# Patient Record
Sex: Female | Born: 1940 | Race: Black or African American | Hispanic: No | Smoking: Never smoker
Health system: Southern US, Community
[De-identification: ages and names within clinical notes are randomized; demographics above are authoritative.]

## PROBLEM LIST (undated history)

## (undated) DIAGNOSIS — I639 Cerebral infarction, unspecified: Secondary | ICD-10-CM

## (undated) DIAGNOSIS — D214 Benign neoplasm of connective and other soft tissue of abdomen: Secondary | ICD-10-CM

## (undated) DIAGNOSIS — K922 Gastrointestinal hemorrhage, unspecified: Secondary | ICD-10-CM

## (undated) DIAGNOSIS — D126 Benign neoplasm of colon, unspecified: Secondary | ICD-10-CM

## (undated) DIAGNOSIS — I5032 Chronic diastolic (congestive) heart failure: Secondary | ICD-10-CM

## (undated) DIAGNOSIS — K219 Gastro-esophageal reflux disease without esophagitis: Secondary | ICD-10-CM

## (undated) DIAGNOSIS — E785 Hyperlipidemia, unspecified: Secondary | ICD-10-CM

## (undated) DIAGNOSIS — J309 Allergic rhinitis, unspecified: Secondary | ICD-10-CM

## (undated) DIAGNOSIS — I10 Essential (primary) hypertension: Secondary | ICD-10-CM

## (undated) DIAGNOSIS — G4719 Other hypersomnia: Secondary | ICD-10-CM

## (undated) DIAGNOSIS — M3320 Polymyositis, organ involvement unspecified: Secondary | ICD-10-CM

## (undated) HISTORY — PX: TONSILLECTOMY: SUR1361

## (undated) HISTORY — PX: EUS: SHX5427

## (undated) HISTORY — PX: LUMBAR LAMINECTOMY: SHX95

## (undated) HISTORY — PX: BACK SURGERY: SHX140

## (undated) HISTORY — PX: ABDOMINAL HYSTERECTOMY: SHX81

## (undated) HISTORY — DX: Benign neoplasm of colon, unspecified: D12.6

## (undated) HISTORY — DX: Other hypersomnia: G47.19

## (undated) HISTORY — DX: Gastro-esophageal reflux disease without esophagitis: K21.9

## (undated) HISTORY — DX: Essential (primary) hypertension: I10

## (undated) HISTORY — DX: Polymyositis, organ involvement unspecified: M33.20

## (undated) HISTORY — DX: Allergic rhinitis, unspecified: J30.9

## (undated) HISTORY — PX: ROTATOR CUFF REPAIR: SHX139

---

## 2003-11-02 ENCOUNTER — Encounter: Admission: RE | Admit: 2003-11-02 | Discharge: 2003-11-02 | Payer: Self-pay | Admitting: Internal Medicine

## 2004-03-12 ENCOUNTER — Emergency Department (HOSPITAL_COMMUNITY): Admission: EM | Admit: 2004-03-12 | Discharge: 2004-03-12 | Payer: Self-pay | Admitting: Emergency Medicine

## 2004-11-04 ENCOUNTER — Ambulatory Visit (HOSPITAL_COMMUNITY): Admission: RE | Admit: 2004-11-04 | Discharge: 2004-11-04 | Payer: Self-pay | Admitting: Internal Medicine

## 2004-11-12 ENCOUNTER — Ambulatory Visit: Payer: Self-pay | Admitting: Gastroenterology

## 2004-11-18 ENCOUNTER — Ambulatory Visit: Payer: Self-pay | Admitting: Gastroenterology

## 2004-11-18 ENCOUNTER — Ambulatory Visit: Payer: Self-pay | Admitting: Critical Care Medicine

## 2004-11-25 ENCOUNTER — Ambulatory Visit: Payer: Self-pay | Admitting: Cardiology

## 2004-12-03 ENCOUNTER — Ambulatory Visit: Payer: Self-pay | Admitting: Gastroenterology

## 2004-12-15 ENCOUNTER — Emergency Department (HOSPITAL_COMMUNITY): Admission: EM | Admit: 2004-12-15 | Discharge: 2004-12-15 | Payer: Self-pay | Admitting: Emergency Medicine

## 2004-12-23 ENCOUNTER — Ambulatory Visit: Payer: Self-pay | Admitting: Critical Care Medicine

## 2005-01-24 ENCOUNTER — Ambulatory Visit: Payer: Self-pay | Admitting: Gastroenterology

## 2005-02-06 ENCOUNTER — Encounter (INDEPENDENT_AMBULATORY_CARE_PROVIDER_SITE_OTHER): Payer: Self-pay | Admitting: Specialist

## 2005-02-06 ENCOUNTER — Ambulatory Visit: Payer: Self-pay | Admitting: Gastroenterology

## 2005-02-06 DIAGNOSIS — D126 Benign neoplasm of colon, unspecified: Secondary | ICD-10-CM | POA: Insufficient documentation

## 2005-02-19 ENCOUNTER — Ambulatory Visit: Payer: Self-pay | Admitting: Gastroenterology

## 2005-02-19 ENCOUNTER — Ambulatory Visit (HOSPITAL_COMMUNITY): Admission: RE | Admit: 2005-02-19 | Discharge: 2005-02-19 | Payer: Self-pay | Admitting: Gastroenterology

## 2005-02-26 ENCOUNTER — Ambulatory Visit: Payer: Self-pay | Admitting: Internal Medicine

## 2005-04-24 ENCOUNTER — Ambulatory Visit: Payer: Self-pay | Admitting: Internal Medicine

## 2005-06-12 ENCOUNTER — Emergency Department (HOSPITAL_COMMUNITY): Admission: EM | Admit: 2005-06-12 | Discharge: 2005-06-12 | Payer: Self-pay | Admitting: Emergency Medicine

## 2005-06-24 ENCOUNTER — Ambulatory Visit: Payer: Self-pay | Admitting: Internal Medicine

## 2005-09-08 ENCOUNTER — Encounter: Admission: RE | Admit: 2005-09-08 | Discharge: 2005-09-08 | Payer: Self-pay | Admitting: Neurosurgery

## 2005-11-12 ENCOUNTER — Ambulatory Visit: Payer: Self-pay | Admitting: Internal Medicine

## 2005-11-28 ENCOUNTER — Emergency Department (HOSPITAL_COMMUNITY): Admission: EM | Admit: 2005-11-28 | Discharge: 2005-11-28 | Payer: Self-pay | Admitting: Emergency Medicine

## 2006-03-12 ENCOUNTER — Encounter: Admission: RE | Admit: 2006-03-12 | Discharge: 2006-03-12 | Payer: Self-pay | Admitting: Internal Medicine

## 2006-06-16 ENCOUNTER — Encounter: Admission: RE | Admit: 2006-06-16 | Discharge: 2006-06-16 | Payer: Self-pay | Admitting: Internal Medicine

## 2006-06-23 ENCOUNTER — Ambulatory Visit: Payer: Self-pay | Admitting: Internal Medicine

## 2006-08-04 ENCOUNTER — Ambulatory Visit: Payer: Self-pay | Admitting: Internal Medicine

## 2006-08-12 ENCOUNTER — Ambulatory Visit: Payer: Self-pay | Admitting: Gastroenterology

## 2006-08-12 LAB — CONVERTED CEMR LAB
Ferritin: 27.2 ng/mL (ref 10.0–291.0)
Iron: 64 ug/dL (ref 42–145)
Saturation Ratios: 15.9 % — ABNORMAL LOW (ref 20.0–50.0)
Transferrin: 288.1 mg/dL (ref >=212.0)

## 2006-08-26 ENCOUNTER — Ambulatory Visit: Payer: Self-pay | Admitting: Gastroenterology

## 2006-08-26 LAB — CONVERTED CEMR LAB
OCCULT 2: NEGATIVE
OCCULT 3: NEGATIVE
OCCULT 4: NEGATIVE
OCCULT 5: NEGATIVE

## 2006-08-27 ENCOUNTER — Encounter: Payer: Self-pay | Admitting: Gastroenterology

## 2006-08-27 ENCOUNTER — Ambulatory Visit (HOSPITAL_COMMUNITY): Admission: RE | Admit: 2006-08-27 | Discharge: 2006-08-27 | Payer: Self-pay | Admitting: Gastroenterology

## 2006-09-16 ENCOUNTER — Ambulatory Visit: Payer: Self-pay | Admitting: Gastroenterology

## 2007-01-01 ENCOUNTER — Emergency Department (HOSPITAL_COMMUNITY): Admission: EM | Admit: 2007-01-01 | Discharge: 2007-01-01 | Payer: Self-pay | Admitting: Emergency Medicine

## 2007-04-26 DIAGNOSIS — J309 Allergic rhinitis, unspecified: Secondary | ICD-10-CM | POA: Insufficient documentation

## 2007-04-26 DIAGNOSIS — M3320 Polymyositis, organ involvement unspecified: Secondary | ICD-10-CM

## 2007-04-26 DIAGNOSIS — I10 Essential (primary) hypertension: Secondary | ICD-10-CM | POA: Insufficient documentation

## 2007-04-26 DIAGNOSIS — K219 Gastro-esophageal reflux disease without esophagitis: Secondary | ICD-10-CM | POA: Insufficient documentation

## 2007-06-18 ENCOUNTER — Encounter: Admission: RE | Admit: 2007-06-18 | Discharge: 2007-06-18 | Payer: Self-pay | Admitting: Internal Medicine

## 2008-03-09 ENCOUNTER — Ambulatory Visit: Payer: Self-pay | Admitting: Gastroenterology

## 2008-03-22 ENCOUNTER — Ambulatory Visit: Payer: Self-pay | Admitting: Gastroenterology

## 2008-03-22 ENCOUNTER — Encounter: Payer: Self-pay | Admitting: Gastroenterology

## 2008-03-24 ENCOUNTER — Encounter: Payer: Self-pay | Admitting: Gastroenterology

## 2008-03-29 ENCOUNTER — Telehealth: Payer: Self-pay | Admitting: Gastroenterology

## 2008-05-18 ENCOUNTER — Encounter: Admission: RE | Admit: 2008-05-18 | Discharge: 2008-08-16 | Payer: Self-pay | Admitting: Internal Medicine

## 2008-06-21 ENCOUNTER — Encounter: Admission: RE | Admit: 2008-06-21 | Discharge: 2008-09-19 | Payer: Self-pay | Admitting: Internal Medicine

## 2008-06-30 ENCOUNTER — Emergency Department (HOSPITAL_COMMUNITY): Admission: EM | Admit: 2008-06-30 | Discharge: 2008-06-30 | Payer: Self-pay | Admitting: Emergency Medicine

## 2008-07-06 ENCOUNTER — Telehealth (INDEPENDENT_AMBULATORY_CARE_PROVIDER_SITE_OTHER): Payer: Self-pay | Admitting: *Deleted

## 2008-07-06 ENCOUNTER — Encounter: Payer: Self-pay | Admitting: Gastroenterology

## 2008-07-06 DIAGNOSIS — K319 Disease of stomach and duodenum, unspecified: Secondary | ICD-10-CM

## 2008-08-17 ENCOUNTER — Ambulatory Visit: Payer: Self-pay | Admitting: Gastroenterology

## 2008-08-17 ENCOUNTER — Ambulatory Visit (HOSPITAL_COMMUNITY): Admission: RE | Admit: 2008-08-17 | Discharge: 2008-08-17 | Payer: Self-pay | Admitting: Gastroenterology

## 2008-11-09 ENCOUNTER — Encounter: Admission: RE | Admit: 2008-11-09 | Discharge: 2008-11-09 | Payer: Self-pay | Admitting: Internal Medicine

## 2009-01-13 DIAGNOSIS — K922 Gastrointestinal hemorrhage, unspecified: Secondary | ICD-10-CM

## 2009-01-13 HISTORY — DX: Gastrointestinal hemorrhage, unspecified: K92.2

## 2009-03-31 ENCOUNTER — Inpatient Hospital Stay (HOSPITAL_COMMUNITY): Admission: EM | Admit: 2009-03-31 | Discharge: 2009-04-03 | Payer: Self-pay | Admitting: Podiatry

## 2009-04-01 ENCOUNTER — Encounter (INDEPENDENT_AMBULATORY_CARE_PROVIDER_SITE_OTHER): Payer: Self-pay | Admitting: Internal Medicine

## 2009-04-01 ENCOUNTER — Encounter: Payer: Self-pay | Admitting: Internal Medicine

## 2009-04-02 ENCOUNTER — Ambulatory Visit: Payer: Self-pay | Admitting: Internal Medicine

## 2009-04-16 ENCOUNTER — Ambulatory Visit: Payer: Self-pay | Admitting: Gastroenterology

## 2009-04-16 LAB — CONVERTED CEMR LAB
Eosinophils Absolute: 0 10*3/uL (ref 0.0–0.7)
MCHC: 32.4 g/dL (ref 30.0–36.0)
MCV: 86.9 fL (ref 78.0–100.0)
Neutro Abs: 8.5 10*3/uL — ABNORMAL HIGH (ref 1.4–7.7)
Platelets: 353 10*3/uL (ref 150.0–400.0)

## 2009-04-23 ENCOUNTER — Encounter: Payer: Self-pay | Admitting: Internal Medicine

## 2009-06-13 ENCOUNTER — Emergency Department (HOSPITAL_COMMUNITY): Admission: EM | Admit: 2009-06-13 | Discharge: 2009-06-13 | Payer: Self-pay | Admitting: Emergency Medicine

## 2009-06-22 ENCOUNTER — Telehealth (INDEPENDENT_AMBULATORY_CARE_PROVIDER_SITE_OTHER): Payer: Self-pay | Admitting: *Deleted

## 2009-06-28 ENCOUNTER — Ambulatory Visit: Payer: Self-pay | Admitting: Gastroenterology

## 2009-07-02 ENCOUNTER — Encounter: Payer: Self-pay | Admitting: Internal Medicine

## 2009-07-05 ENCOUNTER — Ambulatory Visit (HOSPITAL_COMMUNITY): Admission: RE | Admit: 2009-07-05 | Discharge: 2009-07-05 | Payer: Self-pay | Admitting: Internal Medicine

## 2009-07-09 ENCOUNTER — Encounter: Admission: RE | Admit: 2009-07-09 | Discharge: 2009-07-09 | Payer: Self-pay | Admitting: Orthopedic Surgery

## 2009-08-13 ENCOUNTER — Ambulatory Visit: Payer: Self-pay | Admitting: Internal Medicine

## 2009-08-13 DIAGNOSIS — J99 Respiratory disorders in diseases classified elsewhere: Secondary | ICD-10-CM | POA: Insufficient documentation

## 2009-08-16 ENCOUNTER — Telehealth: Payer: Self-pay | Admitting: Internal Medicine

## 2009-08-16 ENCOUNTER — Ambulatory Visit: Admission: RE | Admit: 2009-08-16 | Discharge: 2009-08-16 | Payer: Self-pay | Admitting: Internal Medicine

## 2009-08-16 ENCOUNTER — Encounter: Payer: Self-pay | Admitting: Internal Medicine

## 2009-09-13 LAB — CONVERTED CEMR LAB
Basophils Relative: 0.1 % (ref 0.0–3.0)
Ferritin: 35.9 ng/mL (ref 10.0–291.0)
HCT: 33.1 % — ABNORMAL LOW (ref 36.0–46.0)
Hemoglobin: 11.1 g/dL — ABNORMAL LOW (ref 12.0–15.0)
Lymphocytes Relative: 11.2 % — ABNORMAL LOW (ref 12.0–46.0)
MCHC: 33.4 g/dL (ref 30.0–36.0)
MCV: 85.9 fL (ref 78.0–100.0)
Monocytes Relative: 2.4 % — ABNORMAL LOW (ref 3.0–12.0)
Neutro Abs: 7.9 10*3/uL — ABNORMAL HIGH (ref 1.4–7.7)
Neutrophils Relative %: 86.2 % — ABNORMAL HIGH (ref 43.0–77.0)

## 2009-09-14 ENCOUNTER — Inpatient Hospital Stay (HOSPITAL_COMMUNITY): Admission: RE | Admit: 2009-09-14 | Discharge: 2009-09-16 | Payer: Self-pay | Admitting: Orthopedic Surgery

## 2009-09-24 ENCOUNTER — Ambulatory Visit: Payer: Self-pay | Admitting: Gastroenterology

## 2010-01-28 ENCOUNTER — Encounter
Admission: RE | Admit: 2010-01-28 | Discharge: 2010-01-28 | Payer: Self-pay | Source: Home / Self Care | Attending: Orthopedic Surgery | Admitting: Orthopedic Surgery

## 2010-02-02 ENCOUNTER — Other Ambulatory Visit: Payer: Self-pay | Admitting: Internal Medicine

## 2010-02-02 DIAGNOSIS — Z1239 Encounter for other screening for malignant neoplasm of breast: Secondary | ICD-10-CM

## 2010-02-03 ENCOUNTER — Encounter: Payer: Self-pay | Admitting: Internal Medicine

## 2010-02-03 ENCOUNTER — Encounter: Payer: Self-pay | Admitting: Rheumatology

## 2010-02-04 ENCOUNTER — Encounter: Payer: Self-pay | Admitting: Internal Medicine

## 2010-02-12 NOTE — Letter (Signed)
Summary: [facility]  [facility]   Imported By: Lester Walworth 08/20/2009 09:44:25  _____________________________________________________________________  External Attachment:    Type:   Image     Comment:   External Document

## 2010-02-12 NOTE — Progress Notes (Signed)
Summary: PFT and ABG results  Phone Note Call from Patient   Caller: Patient Call For:  Glick Reason for Call: Talk to Nurse Summary of Call: Send PFT's done at [facility] to MR and Dr. Rodman Pickle @ [facility]. Initial call taken by: Eugene Gavia,  August 16, 2009 3:26 PM  Follow-up for Phone Call        pt was just seen by MR on 08-13-2009 for surgical clearance and was set up for ABGs and PFTs at [facility].  Results were faxed back to [place] and I put results in MR's look at box for him to review.    ATC pt x 2.  Line rings and then someone picks up and then hangs up.  Will forward message to MR to address test results.  Aundra Millet Reynolds LPN  August 16, 2009 3:37 PM   Additional Follow-up for Phone Call Additional follow up Details #1::        ok wil make addendum to my office note and sent it to ortho doc.   tell patient [facility] for surgery but needs further workup on pft's due to abnormality.needs to see me back after surgery in 1-2 months  Also please check with [facility] pft lab if they did MIP and MEP. I wrote for that but do not see the result Additional Follow-up by: Kalman Shan MD,  August 16, 2009 7:20 PM    Additional Follow-up for Phone Call Additional follow up Details #2::    lm for pft lab to call with results of MIP and MEP   Philipp Deputy [facility]  August 17, 2009 10:31 AM   per carol blackwell these 2 results were faxed to dr Marchelle Gearing and Landry Dyke in his look at.   Philipp Deputy [facility]  August 17, 2009 2:36 PM   Additional Follow-up for Phone Call Additional follow up Details #3:: Details for Additional Follow-up Action Taken: ok thanks. I will see patient in 2 months from now. pls give appt  ATC unable to leave message, phone would ring until someone picked up and then on answer and hang up. Reminder for 2 month follow up placed. Zackery Barefoot CMA  August 21, 2009 9:45 AM  Additional Follow-up by: Kalman Shan MD,  August 21, 2009 3:25 AM

## 2010-02-12 NOTE — Assessment & Plan Note (Signed)
GASTROINTESTINAL PROBLEM LIST: 1. History of colonic adenomas.  Colonoscopy January 2007.  I removed three colon polyps, one was tubulovillous, 10 mm.  Her next colonoscopy has been scheduled for January 2010.  Repeat colonoscopy March, 2010 found more adenomas, recall colonoscopy was set at 3 year interval. 2. 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  This is seen by EGD January 2007, small, round.  Attempt at a [place] was done two weeks later, but she did not tolerate the procedure very well, due to gagging and discomfort. Repeat EGD January, 2010 found lesion have not changed in size in 3 year interval, still less than 1 cm. Recommend clinical observation only  History of Present Illness Visit Type: Follow-up Visit Primary GI MD: Rob Bunting MD Primary Provider: Weber Cooks. Clelia Croft, MD  Requesting Provider: n/a Chief Complaint: F/u visit  History of Present Illness:     very pleasant 70 year old woman whom I last saw over a year and a half ago. She was recently found to have a significant anemia. She had no overt GI bleeding but was Hemoccult positive and underwent EGD by one of my partners Dr. Leone Payor.  He described n area of her stomach with erythema, several small erosions. It was thought that this may represent gastric antral vascular ectasias. However biopsies that were performed did not show that entity and also only showed reactive changes.   Since leaving the hospital, she has been on 2 pills of iron every day.  NO overt GI bleeding.  Feels overall quite well.   She was taking Alleve (probably only 6 total pills).   her hemoglobin at discharge was 7.8 and this afternoon just prior to this visit her hemoglobin was 9.6.   GI Review of Systems          Current Medications (verified): 1)  Myferon 150 Forte 150-25-1 Mg-Mcg-Mg Caps (Iron Polysacch Cmplx-B12-Fa) .... One Tablet By Mouth Once Daily 2)  Atenolol 50 Mg Tabs (Atenolol) .... One Tablet By Mouth Every Morning 3)  Fish Oil 1000 Mg  Caps (Omega-3 Fatty Acids) .... One Tablet By Mouth Once Daily 4)  Folic Acid 400 Mcg Tabs (Folic Acid) .... One Tablet By Mouth Once Daily 5)  Glucosamine 500 Mg Caps (Glucosamine Sulfate) .... One Tablet Two Times A Day 6)  Nexium 40 Mg Cpdr (Esomeprazole Magnesium) .... One Tablet By Mouth Once Daily 7)  Prednisone 10 Mg Tabs (Prednisone) .... One Tablet By Mouth Once Daily Every Morning 8)  Vitamin B-12 1000 Mcg Tabs (Cyanocobalamin) .... One Tablet By Mouth Two Times A Day 9)  Vitamin B-12 500 Mcg  Tabs (Cyanocobalamin) .... One Tablet By Mouth Once Daily 10)  Vitamin D (Ergocalciferol) 50000 Unit Caps (Ergocalciferol) .... One Tablet By Mouth Once Daily 11)  Vitamin E 600 Unit  Caps (Vitamin E) .... One Tablet By Mouth Once Daily 12)  Xanax 0.25 Mg Tabs (Alprazolam) .... Every Six Hours As Needed For Pain 13)  Furosemide 40 Mg Tabs (Furosemide) .... One Tablet By Mouth Once Daily  Allergies (verified): No Known Drug Allergies  Vital Signs:  Patient profile:   70 year old female Pulse rate:   84 / minute Pulse rhythm:   regular BP sitting:   132 / 78  (left arm) Cuff size:   regular  Vitals Entered By: Ok Anis CMA (April 16, 2009 3:18 PM)  Physical Exam  Additional Exam:  Constitutional: cushingoid appearing, sitting in wheelchair Psychiatric: alert and oriented times 3 Abdomen: soft, non-tender, non-distended, normal  bowel sounds    Impression & Recommendations:  Problem # 1:  erythema, erosions and distal stomach this process probably did contribute to her anemia however she has not had any overt GI bleeding. Her hemoglobin bumped very well on iron replacement and I recommended he simply continue the iron replacement once to twice daily for now. Our office will set up a repeat CBC in 2 months time. She knows that she has symptomatic, overt GI bleeding she is to call.  Patient Instructions: 1)  Stay on iron once to twice a day.  2)  Repeat CBC, iron, Ferritin, TIBC  in 2 months. 3)  Stay off of NSAIDs.  Tylenol should be your first pain medicine choice. 4)  IF any obvious GI bleeding (vomitting blood, passing blood in stool) please call Dr. Christella Hartigan. 5)  A copy of this information will be sent to Dr. Clelia Croft. 6)  The medication list was reviewed and reconciled.  All changed / newly prescribed medications were explained.  A complete medication list was provided to the patient / caregiver.

## 2010-02-12 NOTE — Letter (Signed)
Summary: [facility]  [facility]   Imported By: Lester [place] 08/20/2009 09:43:13  _____________________________________________________________________  External Attachment:    Type:   Image     Comment:   External Document

## 2010-02-12 NOTE — Procedures (Signed)
Summary: Upper Endoscopy  Patient: [name] Note: All result statuses are Final unless otherwise noted.  Tests: (1) Upper Endoscopy (EGD)   EGD Upper Endoscopy       DONE     [place] [facility]     [address] [place], [place]  [address]           ENDOSCOPY PROCEDURE REPORT           PATIENT:  [name] [name], [name] [name]  MR#:  [id number]     BIRTHDATE:  Jun 27, 1940, 68 yrs. old  GENDER:  female           ENDOSCOPIST:  Iva Boop, MD, [facility]     Referred by:  Kari Baars, M.D.           PROCEDURE DATE:  04/01/2009     PROCEDURE:  EGD with biopsy     ASA CLASS:  Class III     INDICATIONS:  anemia, hemeoccult positive stool           MEDICATIONS:   Fentanyl 50 mcg, Versed 5 mg     TOPICAL ANESTHETIC:  Cetacaine Spray           DESCRIPTION OF PROCEDURE:   After the risks benefits and     alternatives of the procedure were thoroughly explained, informed     consent was obtained.  The EG-[id number] ([id number]) endoscope was     introduced through the mouth and advanced to the second portion of     the duodenum, without limitations.  The instrument was slowly     withdrawn as the mucosa was fully examined.     <<PROCEDUREIMAGES>>           Abnormal appearing mucosa in the antrum. Broad areas of erythema     and submucosal heme, ? gastritis vs. GAVE. Multiple biopsies were     obtained and sent to pathology.  submucosal nodule in the body of     the stomach. 1cm bulge in distal body (as described previously at     EGD and EUS)  Multiple erosions were found in the second portion     of the duodenum. Very superficial, few seen.  There were multiple     polyps identified. Seen in proximal stomach, all subcentimeter and     benign-appearing.  Otherwise the examination was normal.     Retroflexed views revealed Retroflexion exam demonstrated findings as     previously described.    The scope was then withdrawn from the     patient and the procedure completed.        COMPLICATIONS:  None           ENDOSCOPIC IMPRESSION:     1) Abnormal mucosa in the antrum - gastritis vs. gastric antral     vascular ectasia     2) Submucosal nodule in the body of the stomach - unchanged from     prior descriptions     3) Erosions, multiple in the second portion duodenum (a few and     superficial - tiny)     4) Polyps - a few subcentimeter in proximal stomach     5) Otherwise normal examination     RECOMMENDATIONS:     1) Await biopsy results     continue PPI     if this is gastric antral vascular ectasia then ablative therapy     is reasonable.     some of decline  in Hgb from 11-9 is dilutional and suspect an     element of dehydration - but likely chronically leaking some blood     from stomach and some anemia from polymyositis, chronic disease     doubt she needs a repeat colonoscopy (last in 03/2008)           REPEAT EXAM:  await biopsies to determine           Iva Boop, MD, Clementeen Graham           CC:  Kari Baars, MD     Rob Bunting, MD           n.     eSIGNED:   Iva Boop at 04/01/2009 01:15 PM           Gaskill, Veryl Speak, [id number]  Note: An exclamation mark (!) indicates a result that was not dispersed into the flowsheet. Document Creation Date: 04/01/2009 1:16 PM _______________________________________________________________________  (1) Order result status: Final Collection or observation date-time: 04/01/2009 13:01 Requested date-time:  Receipt date-time:  Reported date-time:  Referring Physician:   Ordering Physician: Stan Head [phone]) Specimen Source:  Source: Launa Grill Order Number: [id number] Lab site:

## 2010-02-12 NOTE — Assessment & Plan Note (Signed)
Summary: SURGICAL CLEARANCE LEFT SHOULDER SURGERY/CB   Visit Type:  Initial Consult Copy to:  Dr. Madelon Lips Primary Provider/Referring Provider:  Dr. Martha Clan  CC:  Pulmonary Consult for surgery clearance.  Pt needs to have surgery on left arm by Dr. Madelon Lips.  History of Present Illness: IOV 08/13/2009: 70 year old female used to be followed by DR Maple [place] till 08/08/2006 for mild intermittent asthm question and allergic rhinitis. She is known steroid dependent since moving to [place] in 2004-2006 due to polymyositis. States that Dr. Madelon Lips wants to operate on left shoulder tear. She thinks she is here for preop pulmonary clearance but she denies any pulmonary problems. Denies dyspnea, wheezing, cough, hemoptysis, sputum, edema, orthopnea, paroxysmal nocturnal dyspnea, fever, chills. Denies using inhalers.   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 SHe even uses wheelchair (motorized) even for bed to bathroom. Able to self feed. Able to go to toilet by self. Able to bathe but not in bathroom; uses a pan. Able to transfer from wheelchair to bed iwth difficulty. Came here as passenger in regular car.   Of note: she is unsure why she needs a preop pulm clearance. I d/w Dr. Madelon Lips and he feels given her comorbidities and collagen vascular disease it would be useful for herto have preop pulm clearance.   Preventive Screening-Counseling & Management  Alcohol-Tobacco     Smoking Status: never  Current Medications (verified): 1)  Myferon 150 Forte 150-25-1 Mg-Mcg-Mg Caps (Iron Polysacch Cmplx-B12-Fa) .... One Tablet By Mouth Once Daily 2)  Atenolol 50 Mg Tabs (Atenolol) .... One Tablet By Mouth Every Morning 3)  Fish Oil 1000 Mg Caps (Omega-3 Fatty Acids) .... One Tablet By Mouth Once Daily 4)  Folic Acid 400 Mcg Tabs (Folic Acid) .... One Tablet By Mouth Once Daily 5)  Glucosamine Chondr 1500 Complx  Caps (Glucosamine-Chondroit-Vit C-Mn) .... Once Daily 6)  Nexium  40 Mg Cpdr (Esomeprazole Magnesium) .... One Tablet By Mouth Once Daily 7)  Prednisone 10 Mg Tabs (Prednisone) .... One Tablet By Mouth Once Daily Every Morning 8)  Vitamin B-12 1000 Mcg Tabs (Cyanocobalamin) .... Take 1 Tablet By Mouth Once A Day 9)  Vitamin D (Ergocalciferol) 50000 Unit Caps (Ergocalciferol) .... One Tablet By Mouth Once Daily 10)  Vitamin E 600 Unit  Caps (Vitamin E) .... One Tablet By Mouth Once Daily 11)  Xanax 0.25 Mg Tabs (Alprazolam) .... Every Six Hours As Needed For Pain 12)  Furosemide 40 Mg Tabs (Furosemide) .... One Tablet By Mouth Once Daily 13)  Niacin 500 Mg Tabs (Niacin) .... Take 1 Tablet By Mouth Once A Day 14)  Vitamin C 1000 Mg Tabs (Ascorbic Acid) .... Take 1 Tablet By Mouth Once A Day 15)  Blue Manna 400mg  .... Once Daily 16)  Caltrate 600 1500 Mg Tabs (Calcium Carbonate) .... Once Daily 17)  Atenolol 50 Mg Tabs (Atenolol) .... Once Daily 18)  Klor-Con M20 20 Meq Cr-Tabs (Potassium Chloride Crys Cr) .... Take 1 Tablet By Mouth Once A Day 19)  Poly Iron .... Once Daily  Allergies (verified): 1)  Codeine  Past History:  Past Surgical History: Last updated: 04/26/2007 Lumbar Laminectomy (1998) PAH Tonsillectomy  Family History: Last updated: 08/13/2009 allergies-grandchildren  Social History: Last updated: 08/13/2009 Patient never smoked.  no alcohol  total of 5 children - 4 living children Retired form Nurse, learning disability in [place]  Risk Factors: Smoking Status: never (08/13/2009)  Past Medical History: Current Problems:  COLONIC POLYPS, ADENOMATOUS (ICD-211.3) GASTROESOPHAGEAL REFLUX DISEASE (ICD-530.81) POLYMYOSITIS (ICD-710.4) ALLERGIC RHINITIS (ICD-477.9) HYPERTENSION (ICD-401.9) Question of Mild Intermittent Asthma and Allergic Rhiniits  > Dr Fannie Knee patient till 08/08/2006  Past Pulmonary History:  Pulmonary History: DATE OF ADMISSION:  03/31/2009   DATE OF DISCHARGE:  04/03/2009      DIAGNOSES AT TIME OF DISCHARGE:    1. 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   2. Polymyositis on chronic steroids.   3. Essential hypertension.   4. Osteoporosis with compression fracture.   Family History: allergies-grandchildren  Social History: Patient never smoked.  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  The patient denies shortness of breath with activity, shortness of breath at rest, productive cough, non-productive cough, coughing up blood, chest pain, irregular heartbeats, indigestion, loss of appetite, weight change, abdominal pain, difficulty swallowing, sore throat, tooth/dental problems, headaches, nasal congestion/difficulty breathing through nose, sneezing, itching, ear ache, hand/feet swelling, rash, change in color of mucus, and fever.         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 SHe even uses wheelchair (motorized) even for bed to bathroom. Able to self feed. Able to go to toilet by self. Able to bathe but not in bathroom; uses a pan. Able to transfer from wheelchair to bed iwth difficulty. Came here as passenger in regular car.   Vital Signs:  Patient profile:   70 year old female O2 Sat:      99 % on Room air Temp:     98.5 degrees F oral Pulse rate:   82 / minute BP sitting:   134 / 86  (right arm) Cuff size:   regular  Vitals Entered By: Gweneth Dimitri RN (August 13, 2009 2:59 PM)  O2 Flow:  Room air CC: Pulmonary Consult for surgery clearance.  Pt needs to have surgery on left arm by Dr. Madelon Lips   Physical Exam  General:  CHRONICALLY UNWELL LOOKING sitting in wheel chair pleaseant unable to abduct her shoulders beyond 90 degree cushingoid Head:  normocephalic and atraumatic Eyes:  PERRLA/EOM intact;  conjunctiva and sclera clear Ears:  TMs intact and clear with normal canals Nose:  no deformity, discharge, inflammation, or lesions Mouth:  no deformity or lesions Neck:  no masses, thyromegaly, or abnormal cervical nodes Chest Wall:  no deformities noted Lungs:  clear bilaterally to auscultation and percussion Heart:  regular rate and rhythm, S1, S2 without murmurs, rubs, gallops, or clicks Abdomen:  bowel sounds positive; abdomen soft and non-tender without masses, or organomegaly Msk:  siting in wheelchair Pulses:  pulses normal Extremities:  no clubbing, cyanosis, edema, or deformity noted Neurologic:  CN II-XII grossly intact with normal reflexes, coordination, muscle strength and tone Skin:  intact without lesions or rashes Cervical Nodes:  no significant adenopathy Axillary Nodes:  no significant adenopathy Psych:  alert and cooperative; normal mood and affect; normal attention span and concentration   CT Abdomen/Pelvis  Procedure date:  03/31/2009  Findings:      ABd ct lung cut - looks clear. No evidence of Interst Lung DIsease  CXR  Procedure date:  03/31/2009  Findings:      looks clear. There  is someRt diaph elevation  CT of Chest  Procedure date:  06/16/2006  Findings:      no evidence of ILD per official report   Comments:      similar to CT chest in nov 2006. 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 THis can involve the lung either as intersititial lung disease or resp muscle weakness. She is very disabled  and is wheel chair bound. Within these confines she denies resp complaints. REview of imaging 2006 -> 2011 does not show any evidence of ILD. However CXR March 31, 2009 shows right diaphragm weakness. Therefore resp muscle myopathy might be present (either due to PM or even steroids). We can  test for this from a presurgical stand point only. Her  poor functional status implies no value in testing for other reasons. Will get ABG, and NIF and PFTs (doubt she can cooperate for PFTs)  Orders: Pulmonary Referral (Pulmonary) Consultation Level V (87564)  Problem # 2:  PRE-OPERATIVE RESPIRATORY EXAMINATION (ICD-V72.82) Assessment: New  OTher than disabled status and age 62 I do not see any risk factor for pulmonary issues following shoulder surgery. OVreall risk is low-moderate and acceptable. However, would like to get PFTs and ABG to refine risk assessment further. NEvertheless, will recommend surgery in hospital with anesthesia support. Wil recommend DVT prophylaxis and stress dose steroids after surgery  d/w Dr. Madelon Lips over phone  Orders: Pulmonary Referral (Pulmonary) Consultation Level V [phone])  Medications Added to Medication List This Visit: 1)  Glucosamine Chondr 1500 Complx Caps (Glucosamine-chondroit-vit c-mn) .... Once daily 2)  Vitamin B-12 1000 Mcg Tabs (Cyanocobalamin) .... Take 1 tablet by mouth once a day 3)  Niacin 500 Mg Tabs (Niacin) .... Take 1 tablet by mouth once a day 4)  Vitamin C 1000 Mg Tabs (Ascorbic acid) .... Take 1 tablet by mouth once a day 5)  Blue Manna 400mg   .... Once daily 6)  Caltrate 600 1500 Mg Tabs (Calcium carbonate) .... Once daily 7)  Atenolol 50 Mg Tabs (Atenolol) .... Once daily 8)  Klor-con M20 20 Meq Cr-tabs (Potassium chloride crys cr) .... Take 1 tablet by mouth once a day 9)  Poly Iron  .... Once daily  Patient Instructions: 1)  Please have the following tests and cal  back for review  2)   > ABG 3)   > Full PFTs and MIP and ME  4)  All tests to be done at [facility] long pft lab 5)  Based on these tests I will give final clearance for surgery   Immunization History:  Influenza Immunization History:    Influenza:  historical (10/13/2008)   Appended Document: SURGICAL CLEARANCE LEFT SHOULDER SURGERY/CB REviewed PFTs.  SHows restriction FVC is 73%. ABG is normal. Acceptable for surgery. Mild-moderate risk. WOuld rec in hospital anesthesia support at extubation, stress dose steroids postoperatively, dvt prophylaxis, incentive spirometry and early mobilization.

## 2010-02-12 NOTE — Progress Notes (Signed)
Summary: lab reminder   Phone Note Outgoing Call Call back at Home Phone [phone]   Call placed by: Chales Abrahams CMA Duncan Dull),  June 22, 2009 9:37 AM Summary of Call: called and reminded pt to have labs Initial call taken by: Chales Abrahams CMA Duncan Dull),  June 22, 2009 9:38 AM

## 2010-02-15 ENCOUNTER — Ambulatory Visit: Payer: Self-pay

## 2010-02-20 ENCOUNTER — Encounter (HOSPITAL_COMMUNITY)
Admission: RE | Admit: 2010-02-20 | Discharge: 2010-02-20 | Disposition: A | Discharge status: Home / Self Care | Payer: Medicare Other | Source: Ambulatory Visit | Attending: Orthopedic Surgery | Admitting: Orthopedic Surgery

## 2010-02-20 DIAGNOSIS — M719 Bursopathy, unspecified: Secondary | ICD-10-CM | POA: Insufficient documentation

## 2010-02-20 DIAGNOSIS — M67919 Unspecified disorder of synovium and tendon, unspecified shoulder: Secondary | ICD-10-CM | POA: Insufficient documentation

## 2010-02-20 LAB — CBC
HCT: 36.4 % (ref 36.0–46.0)
MCH: 27.2 pg (ref 26.0–34.0)
MCHC: 32.4 g/dL (ref 30.0–36.0)
MCV: 83.9 fL (ref 78.0–100.0)
RDW: 18.2 % — ABNORMAL HIGH (ref 11.5–15.5)

## 2010-02-20 LAB — BASIC METABOLIC PANEL
Calcium: 9.2 mg/dL (ref 8.4–10.5)
GFR calc Af Amer: >60 mL/min (ref >60)
GFR calc non Af Amer: >60 mL/min (ref >60)
Sodium: 140 mEq/L (ref 135–145)

## 2010-02-20 LAB — SURGICAL PCR SCREEN: MRSA, PCR: NEGATIVE

## 2010-02-22 ENCOUNTER — Observation Stay (HOSPITAL_COMMUNITY)
Admission: RE | Admit: 2010-02-22 | Discharge: 2010-02-24 | DRG: 497 | Disposition: A | Discharge status: Home Health | Payer: Medicare Other | Source: Ambulatory Visit | Attending: Orthopedic Surgery | Admitting: Orthopedic Surgery

## 2010-02-22 DIAGNOSIS — I10 Essential (primary) hypertension: Secondary | ICD-10-CM | POA: Insufficient documentation

## 2010-02-22 DIAGNOSIS — M19019 Primary osteoarthritis, unspecified shoulder: Secondary | ICD-10-CM | POA: Insufficient documentation

## 2010-02-22 DIAGNOSIS — Z5333 Arthroscopic surgical procedure converted to open procedure: Secondary | ICD-10-CM | POA: Insufficient documentation

## 2010-02-22 DIAGNOSIS — M719 Bursopathy, unspecified: Principal | ICD-10-CM | POA: Insufficient documentation

## 2010-02-22 DIAGNOSIS — M67919 Unspecified disorder of synovium and tendon, unspecified shoulder: Principal | ICD-10-CM | POA: Insufficient documentation

## 2010-02-22 DIAGNOSIS — M25819 Other specified joint disorders, unspecified shoulder: Secondary | ICD-10-CM | POA: Insufficient documentation

## 2010-02-22 DIAGNOSIS — M3320 Polymyositis, organ involvement unspecified: Secondary | ICD-10-CM | POA: Insufficient documentation

## 2010-02-22 DIAGNOSIS — K219 Gastro-esophageal reflux disease without esophagitis: Secondary | ICD-10-CM | POA: Insufficient documentation

## 2010-02-22 DIAGNOSIS — E669 Obesity, unspecified: Secondary | ICD-10-CM | POA: Insufficient documentation

## 2010-02-23 LAB — BASIC METABOLIC PANEL
BUN: 17 mg/dL (ref 6–23)
Calcium: 8.9 mg/dL (ref 8.4–10.5)
GFR calc non Af Amer: 60 mL/min — ABNORMAL LOW (ref >60)
Potassium: 3.6 mEq/L (ref 3.5–5.1)
Sodium: 141 mEq/L (ref 135–145)

## 2010-02-23 LAB — CBC
Platelets: 316 10*3/uL (ref 150–400)
RDW: 18.2 % — ABNORMAL HIGH (ref 11.5–15.5)
WBC: 13.2 10*3/uL — ABNORMAL HIGH (ref 4.0–10.5)

## 2010-02-24 LAB — CBC
HCT: 33 % — ABNORMAL LOW (ref 36.0–46.0)
Hemoglobin: 10.6 g/dL — ABNORMAL LOW (ref 12.0–15.0)
RDW: 18.3 % — ABNORMAL HIGH (ref 11.5–15.5)
WBC: 11.4 10*3/uL — ABNORMAL HIGH (ref 4.0–10.5)

## 2010-02-26 NOTE — Op Note (Signed)
  NAME:  [name] [name], [name] [name] NO.:  [id number]  MEDICAL RECORD NO.:  [id number]           PATIENT TYPE:  LOCATION:                                 FACILITY:  PHYSICIAN:  Dyke Brackett, M.D.    DATE OF BIRTH:  02/14/1940  DATE OF PROCEDURE: DATE OF DISCHARGE:                              OPERATIVE REPORT   INDICATIONS:  This is a 70 year old with intractable shoulder pain, status post left rotator cuff tear with significant medical history including severe polymyositis with MRI-proven symptomatic right rotator cuff, thought to be amenable to overnight hospitalization due to medical complexity and comorbidities.  PREOPERATIVE DIAGNOSES: 1. Complete 5 cm of rotator cuff tear, right shoulder. 2. Impingement. 3. Acromioclavicular joint arthritis. 4. 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  POSTOPERATIVE DIAGNOSES: 1. Complete 5 cm of rotator cuff tear, right shoulder. 2. Impingement. 3. Acromioclavicular joint arthritis. 4. 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  OPERATION: 1. Open rotator cuff repair, acromioplasty which is chronic. 2. Open distal clavicle excision. 3. Arthroscopic debridement, glenohumeral joint.  SURGEON:  Dyke Brackett, MD  ASSISTANT:  Laural Benes. Su Hilt, [place]  BLOOD LOSS:  Minimal.  ANESTHESIA:  General with a nerve block.  DESCRIPTION OF PROCEDURE:  Examination under anesthesia showed she had no loss of motion, no instability.  She was arthroscoped and posterior anterior portal.  Systematic inspection of the glenohumeral joint showed degenerative tearing in the anterior-superior labrum, but actually some mild to moderate glenohumeral degenerative changes which were debrided separate from the rest of the procedure.  She had a complete rotator cuff tear as I had dictated above.  We decided at that point to convert the procedure to open procedure, made an incision bisecting the AC acromial interval and the deltoid 2 cm distal to the  tip of the acromion excised, 1.5 of the [facility] joint distal clavicle as well as acromioplasty was, released the CA ligament.  Bursectomy was carried out with a lot of degenerative tissue.  We actually enlarged the tear somewhat to good viable tissue.  There was a stump of tendon remained.  We did not use this and set to oversew it once we repaired the tear.  To repair the tear, we used a combination of 2.5 x 5 Arthrex anchors placed over the foot.  After a burred surface of bone was obtained and followed that up by placing the PushLock on the lateral aspect of the humerus to imbricate the knots and the sutures over additional cuff tissue, essentially a watertight repair was obtained with dissection of a very small minute leading edge that we could not get "watertight."  Closure was effected on the deltoid with #1 Ethibond and 2-0 Vicryl and Monocryl for skin.  Lightly pressure sterile dressing and sling were applied.  She was taken to the recovery room in stable condition.     Dyke Brackett, M.D.     WDC/MEDQ  D:  02/22/2010  T:  02/23/2010  Job:  [id number]  Electronically Signed by W. Rockne Dearinger M.D. on 02/26/2010 09:36:54 AM

## 2010-03-12 ENCOUNTER — Ambulatory Visit: Payer: Self-pay

## 2010-03-14 ENCOUNTER — Other Ambulatory Visit: Payer: Self-pay

## 2010-03-14 LAB — CONVERTED CEMR LAB
Basophils Absolute: 0 10*3/uL (ref 0.0–0.1)
Eosinophils Absolute: 0.1 10*3/uL (ref 0.0–0.7)
Ferritin: 58.9 ng/mL (ref 10.0–291.0)
HCT: 34.7 % — ABNORMAL LOW (ref 36.0–46.0)
Hemoglobin: 11.2 g/dL — ABNORMAL LOW (ref 12.0–15.0)
Iron: 52 ug/dL (ref 42–145)
Monocytes Absolute: 0.9 10*3/uL (ref 0.1–1.0)
RBC: 4.01 M/uL (ref 3.87–5.11)
RDW: 18.8 % — ABNORMAL HIGH (ref 11.5–14.6)
Transferrin: 305.2 mg/dL (ref 212.0–360.0)
WBC: 9.4 10*3/uL (ref 4.5–10.5)

## 2010-03-29 LAB — BLOOD GAS, ARTERIAL
Acid-Base Excess: 1.3 mmol/L (ref 0.0–2.0)
Bicarbonate: 25 mEq/L — ABNORMAL HIGH (ref 20.0–24.0)
O2 Saturation: 96.4 %
Patient temperature: 98.6
TCO2: 22.5 mmol/L (ref 0–100)
pO2, Arterial: 81.4 mmHg (ref 80.0–100.0)

## 2010-03-29 LAB — DIFFERENTIAL
Basophils Absolute: 0 10*3/uL (ref 0.0–0.1)
Eosinophils Absolute: 0.1 10*3/uL (ref 0.0–0.7)
Lymphs Abs: 1.7 10*3/uL (ref 0.7–4.0)
Monocytes Absolute: 0.7 10*3/uL (ref 0.1–1.0)
Monocytes Relative: 9 % (ref 3–12)
Neutro Abs: 5.5 10*3/uL (ref 1.7–7.7)

## 2010-03-29 LAB — COMPREHENSIVE METABOLIC PANEL
ALT: 31 U/L (ref 0–35)
AST: 43 U/L — ABNORMAL HIGH (ref 0–37)
Albumin: 3.6 g/dL (ref 3.5–5.2)
Alkaline Phosphatase: 57 U/L (ref 39–117)
CO2: 23 mEq/L (ref 19–32)
Chloride: 99 mEq/L (ref 96–112)
Creatinine, Ser: 0.78 mg/dL (ref 0.4–1.2)
GFR calc Af Amer: >60 mL/min (ref >60)
GFR calc non Af Amer: >60 mL/min (ref >60)
Potassium: 3.9 mEq/L (ref 3.5–5.1)
Total Bilirubin: 0.6 mg/dL (ref 0.3–1.2)

## 2010-03-29 LAB — CBC
Hemoglobin: 13.2 g/dL (ref 12.0–15.0)
MCH: 26.3 pg (ref 26.0–34.0)
RBC: 5.01 MIL/uL (ref 3.87–5.11)
WBC: 8 10*3/uL (ref 4.0–10.5)

## 2010-04-07 LAB — HEMOGLOBIN AND HEMATOCRIT, BLOOD
HCT: 27.3 % — ABNORMAL LOW (ref 36.0–46.0)
HCT: 30.4 % — ABNORMAL LOW (ref 36.0–46.0)
HCT: 30.4 % — ABNORMAL LOW (ref 36.0–46.0)
HCT: 35 % — ABNORMAL LOW (ref 36.0–46.0)
Hemoglobin: 11.1 g/dL — ABNORMAL LOW (ref 12.0–15.0)
Hemoglobin: 8 g/dL — ABNORMAL LOW (ref 12.0–15.0)
Hemoglobin: 8.9 g/dL — ABNORMAL LOW (ref 12.0–15.0)
Hemoglobin: 9.7 g/dL — ABNORMAL LOW (ref 12.0–15.0)

## 2010-04-07 LAB — CBC
HCT: 31.5 % — ABNORMAL LOW (ref 36.0–46.0)
HCT: 35.3 % — ABNORMAL LOW (ref 36.0–46.0)
Hemoglobin: 11.2 g/dL — ABNORMAL LOW (ref 12.0–15.0)
Hemoglobin: 9.8 g/dL — ABNORMAL LOW (ref 12.0–15.0)
MCHC: 31.2 g/dL (ref 30.0–36.0)
MCHC: 31.6 g/dL (ref 30.0–36.0)
MCV: 86.1 fL (ref 78.0–100.0)
MCV: 86.6 fL (ref 78.0–100.0)
MCV: 87 fL (ref 78.0–100.0)
Platelets: 243 10*3/uL (ref 150–400)
Platelets: 340 10*3/uL (ref 150–400)
RBC: 3.07 MIL/uL — ABNORMAL LOW (ref 3.87–5.11)
RDW: 20.1 % — ABNORMAL HIGH (ref 11.5–15.5)
RDW: 20.4 % — ABNORMAL HIGH (ref 11.5–15.5)
WBC: 9.4 10*3/uL (ref 4.0–10.5)

## 2010-04-07 LAB — CK TOTAL AND CKMB (NOT AT ARMC)
CK, MB: 21.6 ng/mL (ref 0.3–4.0)
CK, MB: 22.5 ng/mL (ref 0.3–4.0)
Relative Index: 1.1 (ref 0.0–2.5)
Relative Index: 1.2 (ref 0.0–2.5)
Total CK: 1893 U/L — ABNORMAL HIGH (ref 7–177)
Total CK: 1911 U/L — ABNORMAL HIGH (ref 7–177)

## 2010-04-07 LAB — BLOOD GAS, ARTERIAL
Bicarbonate: 13.6 mEq/L — ABNORMAL LOW (ref 20.0–24.0)
Patient temperature: 98.6
pCO2 arterial: 29.2 mmHg — ABNORMAL LOW (ref 35.0–45.0)
pH, Arterial: 7.29 — ABNORMAL LOW (ref 7.350–7.400)

## 2010-04-07 LAB — COMPREHENSIVE METABOLIC PANEL
ALT: 35 U/L (ref 0–35)
AST: 22 U/L (ref 0–37)
AST: 33 U/L (ref 0–37)
Albumin: 3.5 g/dL (ref 3.5–5.2)
Albumin: 3.6 g/dL (ref 3.5–5.2)
Alkaline Phosphatase: 47 U/L (ref 39–117)
BUN: 71 mg/dL — ABNORMAL HIGH (ref 6–23)
BUN: 82 mg/dL — ABNORMAL HIGH (ref 6–23)
CO2: 18 mEq/L — ABNORMAL LOW (ref 19–32)
Calcium: 10 mg/dL (ref 8.4–10.5)
Calcium: 10.1 mg/dL (ref 8.4–10.5)
Calcium: 8.2 mg/dL — ABNORMAL LOW (ref 8.4–10.5)
Calcium: 9 mg/dL (ref 8.4–10.5)
Creatinine, Ser: 1.09 mg/dL (ref 0.4–1.2)
Creatinine, Ser: 1.58 mg/dL — ABNORMAL HIGH (ref 0.4–1.2)
Creatinine, Ser: 1.95 mg/dL — ABNORMAL HIGH (ref 0.4–1.2)
GFR calc Af Amer: 30 mL/min — ABNORMAL LOW (ref >60)
GFR calc Af Amer: 31 mL/min — ABNORMAL LOW (ref >60)
GFR calc Af Amer: >60 mL/min (ref >60)
GFR calc non Af Amer: 50 mL/min — ABNORMAL LOW (ref >60)
Glucose, Bld: 72 mg/dL (ref 70–99)
Glucose, Bld: 78 mg/dL (ref 70–99)
Glucose, Bld: 81 mg/dL (ref 70–99)
Potassium: 4.8 mEq/L (ref 3.5–5.1)
Sodium: 136 mEq/L (ref 135–145)
Total Bilirubin: 0.4 mg/dL (ref 0.3–1.2)
Total Protein: 5.8 g/dL — ABNORMAL LOW (ref 6.0–8.3)
Total Protein: 6.8 g/dL (ref 6.0–8.3)
Total Protein: 7.2 g/dL (ref 6.0–8.3)

## 2010-04-07 LAB — URINALYSIS, ROUTINE W REFLEX MICROSCOPIC
Bilirubin Urine: NEGATIVE
Hgb urine dipstick: NEGATIVE
Nitrite: NEGATIVE
Specific Gravity, Urine: 1.018 (ref 1.005–1.030)
Urobilinogen, UA: 0.2 mg/dL (ref 0.0–1.0)
pH: 5 (ref 5.0–8.0)

## 2010-04-07 LAB — POCT CARDIAC MARKERS
CKMB, poc: 16.6 ng/mL (ref 1.0–8.0)
Troponin i, poc: <0.05 ng/mL (ref 0.00–0.09)

## 2010-04-07 LAB — DIFFERENTIAL
Eosinophils Absolute: 0.1 10*3/uL (ref 0.0–0.7)
Eosinophils Relative: 2 % (ref 0–5)
Lymphocytes Relative: 28 % (ref 12–46)
Lymphs Abs: 2.6 10*3/uL (ref 0.7–4.0)
Monocytes Absolute: 0.8 10*3/uL (ref 0.1–1.0)

## 2010-04-07 LAB — BASIC METABOLIC PANEL
BUN: 22 mg/dL (ref 6–23)
Calcium: 7.8 mg/dL — ABNORMAL LOW (ref 8.4–10.5)
Creatinine, Ser: 0.88 mg/dL (ref 0.4–1.2)
GFR calc non Af Amer: >60 mL/min (ref >60)
Glucose, Bld: 88 mg/dL (ref 70–99)

## 2010-04-07 LAB — URINE CULTURE

## 2010-04-07 LAB — NO BLOOD PRODUCTS

## 2010-04-07 LAB — URINE MICROSCOPIC-ADD ON

## 2010-04-07 LAB — LIPASE, BLOOD: Lipase: 55 U/L (ref 11–59)

## 2010-04-07 LAB — SALICYLATE LEVEL: Salicylate Lvl: <4 mg/dL (ref 2.8–20.0)

## 2010-04-07 LAB — LACTIC ACID, PLASMA: Lactic Acid, Venous: 1.3 mmol/L (ref 0.5–2.2)

## 2010-04-09 ENCOUNTER — Other Ambulatory Visit (INDEPENDENT_AMBULATORY_CARE_PROVIDER_SITE_OTHER): Payer: Medicare Other

## 2010-04-09 DIAGNOSIS — K319 Disease of stomach and duodenum, unspecified: Secondary | ICD-10-CM

## 2010-04-09 LAB — CBC WITH DIFFERENTIAL/PLATELET
Basophils Relative: 0.1 % (ref 0.0–3.0)
Eosinophils Absolute: 0.1 10*3/uL (ref 0.0–0.7)
Eosinophils Relative: 0.7 % (ref 0.0–5.0)
Lymphocytes Relative: 13.5 % (ref 12.0–46.0)
Neutrophils Relative %: 83.7 % — ABNORMAL HIGH (ref 43.0–77.0)
Platelets: 349 10*3/uL (ref 150.0–400.0)
RBC: 4.46 Mil/uL (ref 3.87–5.11)
WBC: 8.3 10*3/uL (ref 4.5–10.5)

## 2010-04-10 ENCOUNTER — Telehealth: Payer: Self-pay

## 2010-04-10 NOTE — Telephone Encounter (Signed)
Please call her CBC is normal   ----- Message ----- From: Chales Abrahams, CMA Sent: 04/10/2010 12:28 PM To: Rob Bunting, MD      Pt aware  Of labs

## 2010-04-20 LAB — COMPREHENSIVE METABOLIC PANEL
ALT: 37 U/L — ABNORMAL HIGH (ref 0–35)
AST: 38 U/L — ABNORMAL HIGH (ref 0–37)
Alkaline Phosphatase: 65 U/L (ref 39–117)
CO2: 25 mEq/L (ref 19–32)
Calcium: 9.3 mg/dL (ref 8.4–10.5)
Chloride: 106 mEq/L (ref 96–112)
GFR calc non Af Amer: 57 mL/min — ABNORMAL LOW (ref >60)
Glucose, Bld: 87 mg/dL (ref 70–99)
Sodium: 141 mEq/L (ref 135–145)
Total Bilirubin: 0.5 mg/dL (ref 0.3–1.2)

## 2010-04-20 LAB — CBC
Hemoglobin: 10.9 g/dL — ABNORMAL LOW (ref 12.0–15.0)
MCHC: 32.3 g/dL (ref 30.0–36.0)
RBC: 3.99 MIL/uL (ref 3.87–5.11)
WBC: 9.4 10*3/uL (ref 4.0–10.5)

## 2010-04-21 ENCOUNTER — Emergency Department (HOSPITAL_COMMUNITY)
Admission: EM | Admit: 2010-04-21 | Discharge: 2010-04-21 | Disposition: A | Discharge status: Home / Self Care | Payer: Medicare Other | Attending: Emergency Medicine | Admitting: Emergency Medicine

## 2010-04-21 DIAGNOSIS — M25519 Pain in unspecified shoulder: Secondary | ICD-10-CM | POA: Insufficient documentation

## 2010-04-21 DIAGNOSIS — M719 Bursopathy, unspecified: Secondary | ICD-10-CM | POA: Insufficient documentation

## 2010-04-21 DIAGNOSIS — I10 Essential (primary) hypertension: Secondary | ICD-10-CM | POA: Insufficient documentation

## 2010-04-21 DIAGNOSIS — M67919 Unspecified disorder of synovium and tendon, unspecified shoulder: Secondary | ICD-10-CM | POA: Insufficient documentation

## 2010-04-23 ENCOUNTER — Other Ambulatory Visit: Payer: Self-pay | Admitting: Orthopedic Surgery

## 2010-04-23 DIAGNOSIS — M542 Cervicalgia: Secondary | ICD-10-CM

## 2010-04-30 ENCOUNTER — Ambulatory Visit
Admission: RE | Admit: 2010-04-30 | Discharge: 2010-04-30 | Disposition: A | Discharge status: Home / Self Care | Payer: Medicare Other | Source: Ambulatory Visit | Attending: Orthopedic Surgery | Admitting: Orthopedic Surgery

## 2010-04-30 DIAGNOSIS — M542 Cervicalgia: Secondary | ICD-10-CM

## 2010-05-28 NOTE — Assessment & Plan Note (Signed)
[facility]                             PULMONARY OFFICE NOTE   NAME:[name], [name]                        MRN:          [id number]  DATE:08/04/2006                            DOB:          [birth year]    PROBLEM LIST:  1. Allergic rhinitis.  2. Question of mild intermittent asthma.  3. Weakness from polymyositis on chronic steroid therapy.   HISTORY:  She says she is feeling very much better.  Cough is resolved.  Feet had been swelling a little.  Dr. Clelia Croft gave diuretic and potassium  and resolved the problem.  She is sleeping better.  No more short of  breath while walking.  She did not get PFTs that we aimed to schedule.  She has an appointment with cardiology to evaluate, I think, her edema.   MEDICATIONS:  1. Atenolol 50 mg.  2. Alprazolam 0.25 mg.  3. Prednisone 10 mg daily.  4. Metoclopramide 5 mg.  5. Norvasc.  6. Nexium 40 mg.  7. Vitamins.  8. Furosemide.  9. Potassium uncertain dose.  10.Boniva once a month.  11.Albuterol HFA rescue inhaler.   No medication allergy.   OBJECTIVE:  VITAL SIGNS:  Weight was not recorded, BP 124/72 pulse 89,  room air saturation 99%.  NECK:  No neck vein distention.  EXTREMITIES:  With 1+ edema at the ankles without calf tenderness.  HEART:  Sounds regular without murmur.  I do not hear a gallop.  CHEST:  Quiet without rales or rhonchi.   IMPRESSION:  Dyspnea seemed to respond to diuresis as did her cough  suggesting that the underlying problems was mild pulmonary edema.   She may have retained fluid because her prednisone and her other meds  but the differential is broad and I encouraged her to follow through  with cardiology appointment.   PLAN:  I have offered to see her again p.r.n.  We can do PFTs if  respiratory issues remain a problem, or there is a need to sort out  cardiac versus pulmonary symptoms.  Without dyspnea now, I doubt she has  a great deal of lung disease on an active basis  or that we would learn  enough to change therapy, so we are going to let her go without getting  that testing done for now.  I would be happy to see her again p.r.n.     Clinton D. Maple Hudson, MD, Tonny Bollman, FACP  Electronically Signed    CDY/MedQ  DD: 08/08/2006  DT: 08/09/2006  Job #: [id number]   cc:   Kari Baars, M.D.  Pollyann Savoy, M.D.

## 2010-05-28 NOTE — Assessment & Plan Note (Signed)
[facility]                         GASTROENTEROLOGY OFFICE NOTE   NAME:[name] [name], [name] [name]                        MRN:          [id number]  DATE:08/12/2006                            DOB:          June 15, 1940    PRIMARY CARE PHYSICIAN:  Dr. Buren Kos.   GASTROINTESTINAL PROBLEM LIST:  1. History of colonic adenomas.  Colonoscopy January 2007.  I removed      three colon polyps, one was tubulovillous, 10 mm.  Her next      colonoscopy has been scheduled for January 2010.  2. 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  This is seen by EGD January      2007, small, round.  Attempt at a [place] was done two weeks later, but      she did not tolerate the procedure very well, due to gagging and      discomfort.  I felt that this was endoscopically very innocent-      appearing, so I have not pursued a repeat look to this point.   INTERVAL HISTORY:  I last saw [name] [name] about a year and a half ago at the  time of the workup described above.  Since then, she has had a recent  physical with labs by Dr. Clelia Croft.  Lab tests show she is not anemic  (hemoglobin 12.2), but she does have a slightly low MCV of 78.5.  She  also has slightly low platelets of 113.  I do not see that iron testing  has been done.  She tells me she had heme-occult of her stools checked  about a year ago, and that was negative.   She has had no overt bleeding, no black stools, no hematochezia, no  trouble with constipation or diarrhea.  She does say that, for the past  week or two, at night she will belch up a small bolus of food.  She  generally finishes her dinner by 6:00 and lays down for bed between 9:00  and 10:00.  She is already on Reglan 5 mg t.i.d.   CURRENT MEDICATIONS:  Atenolol, alprazolam, prednisone 10 mg every day,  Reglan 5 mg t.i.d., Norvasc, Nexium, vitamin C, vitamin B12, Caltrate,  folic acid, fish oil, Lasix, potassium, Boniva.  She believes she takes  an iron supplement  sporadically.   PHYSICAL EXAMINATION:  VITAL SIGNS:  She declined a weight check.  Blood  pressure 144/72, pulse 100.  CONSTITUTIONAL:  Obese, otherwise well-appearing.  ABDOMEN:  Soft, nontender, nondistended.  Normal bowel sounds.   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   She was not heme-positive a year ago.  I will send her home with repeat  stool cards.  If any of those are positive, then I will plan to repeat  her colonoscopy sooner than the January 2010 previously scheduled  colonoscopy for polyp surveillance.  I had attempted EUS on her about a  year and a half ago, but she did not tolerate the procedure very well.  I would like to try this again,  to better characterize the subepithelial  lesion, though it does appear innocent endoscopically, endoscopic  ultrasound will give a much better look at it.  We will do this with the  assistance of anesthesia, hopefully using Propofol sedation.  I will get  a set of formal iron  studies done today.  I will call her in a prescription for increased  Reglan to 10 mg t.i.d. to see if that helps, with maybe some mild  gastroparesis.     Rachael Fee, MD  Electronically Signed    DPJ/MedQ  DD: 08/12/2006  DT: 08/13/2006  Job #: [id number]   cc:   Kari Baars, M.D.

## 2010-05-28 NOTE — Assessment & Plan Note (Signed)
[facility]                             PULMONARY OFFICE NOTE   NAME:[name] [name], [name] [name]                        MRN:          [id number]  DATE:06/23/2006                            DOB:          1940/06/16    PROBLEM:  1. Allergic rhinitis.  2. Question of mild intermittent asthma.  3. Weakness from polymyositis on chronic steroid therapy.   HISTORY:  Increased wheezing over the last couple of months, primarily  when she is lying down coughs up a little clear mucus, bothersome  postnasal drip.  She had had a chest x-ray June 3rd and the report  indicates linear atelectasis or scarring in the posterior right upper  lobe, patchy opacity in the posterior right lower lobe possibly due to  atelectasis or pneumonia with no interstitial change.  She feels some  nonspecific pains in the upper anterior chest relieved by ibuprofen.  There is no tussive or exertional pain.  Her albuterol inhaler clogs and  I discussed how to take care of that.   MEDICATION:  1. Atenolol 50 mg.  2. Alprazolam 0.25 mg.  3. Prednisone 10 mg.  4. Metoclopramide 5 mg.  5. Norvasc.  6. Nexium 40 mg.  7. Vitamins.  8. Minerals.  9. Albuterol rescue inhaler p.r.n.   No medication allergy.  She has stopped methotrexate.   OBJECTIVE:  The BP 138/70, pulse 99, room air saturation 96%.  She is  using a walker.  CHEST:  Quiet and clear.  I do not hear rales, crackles, wheeze or  cough.  HEART SOUNDS:  Regular without murmur.  I do not find adenopathy.   IMPRESSION:  1. A CT scan from June 3rd is demonstrating patchy opacity in the      right lower lobe, possible atelectasis or pneumonia.  2. Wheeze while supine consistent with an asthma or bronchiolitis.  3. Polymyositis.   PLAN:  1. We are scheduling pulmonary function test.  2. Schedule return in 1 month, earlier p.r.n.     Clinton D. Maple Hudson, MD, Tonny Bollman, FACP  Electronically Signed    CDY/MedQ  DD: 06/27/2006  DT:  06/28/2006  Job #: [id number]   cc:   Kari Baars, M.D.  Pollyann Savoy, M.D.

## 2010-05-31 NOTE — Assessment & Plan Note (Signed)
[facility]                               PULMONARY OFFICE NOTE   NAME:Graig, SOPHIA                        MRN:          [id number]  DATE:11/12/2005                            DOB:          25-Mar-1940    PROBLEM LIST:  1. Allergic rhinitis.  2. Question of mild intermittent asthma.  3. Weakness from polymyositis on chronic steroid therapy.   HISTORY:  She complains of a hard pain at her left ear occasionally. It  does not seem to affect hearing and is intermittent. She seems to be  referring to the outer aspect of her ear canal rather than either the  external ear of the drum and I am not sure if she is not really feeling her  temporomandibular joint. She has not recognized chest discomfort or wheeze.   MEDICATIONS:  1. Atenolol 50 mg.  2. Alprazolam 0.25 mg.  3. Prednisone 10 mg.  4. Methotrexate.  5. Metoclopramide.  6. Norvasc.  7. Nexium.  8. Multivitamins.  9. Garlic.  10.Fish oil.  11.Rescue albuterol inhaler.   ALLERGIES:  No medication allergy.   OBJECTIVE:  VITAL SIGNS: Blood pressure 128/78, pulse regular, 82. Room air  saturation 98%.  GENERAL: Wheel chair.  HEENT: There is minimal nasal stuffiness with no drainage. Tympanic  membranes were probably benign, I could talk myself into thinking the left  was just slightly reddened with no evidence of fluid, the canal looked  normal. I felt no crepitus or heat associated with the mandibular joints.  There was no adenopathy. Voice quality was normal.  LUNGS:  Were clear and quiet.   IMPRESSION:  1. Ear pain.  2. Rhinitis.   PLAN:  1. A-B Otic ear drops, 1 or 2 in affected ear b.i.d. p.r.n. with cotton.  2. Fluticasone nasal spray once each nostril daily.  3. If pain continues she will see Dr. Clelia Croft and seek referral for ENT      evaluation as necessary.  4. Schedule return in 6 months, earlier p.r.n.     Clinton D. Maple Hudson, MD, Tonny Bollman, FACP  Electronically Signed    CDY/MedQ  DD: 11/14/2005  DT: 11/15/2005  Job #: [id number]   cc:   Kari Baars, M.D.  Kathryne Hitch, MD

## 2010-07-11 ENCOUNTER — Ambulatory Visit
Admission: RE | Admit: 2010-07-11 | Discharge: 2010-07-11 | Disposition: A | Discharge status: Home / Self Care | Payer: Medicare Other | Source: Ambulatory Visit | Attending: Internal Medicine | Admitting: Internal Medicine

## 2010-07-11 DIAGNOSIS — Z1239 Encounter for other screening for malignant neoplasm of breast: Secondary | ICD-10-CM

## 2010-08-25 ENCOUNTER — Emergency Department (HOSPITAL_COMMUNITY)
Admission: EM | Admit: 2010-08-25 | Discharge: 2010-08-25 | Disposition: A | Discharge status: Home / Self Care | Payer: Medicare Other | Attending: Emergency Medicine | Admitting: Emergency Medicine

## 2010-08-25 ENCOUNTER — Emergency Department (HOSPITAL_COMMUNITY): Payer: Medicare Other

## 2010-08-25 DIAGNOSIS — R142 Eructation: Secondary | ICD-10-CM | POA: Insufficient documentation

## 2010-08-25 DIAGNOSIS — R143 Flatulence: Secondary | ICD-10-CM | POA: Insufficient documentation

## 2010-08-25 DIAGNOSIS — R109 Unspecified abdominal pain: Secondary | ICD-10-CM | POA: Insufficient documentation

## 2010-08-25 DIAGNOSIS — I10 Essential (primary) hypertension: Secondary | ICD-10-CM | POA: Insufficient documentation

## 2010-08-25 DIAGNOSIS — R141 Gas pain: Secondary | ICD-10-CM | POA: Insufficient documentation

## 2010-09-02 ENCOUNTER — Ambulatory Visit: Payer: Medicare Other | Attending: Internal Medicine | Admitting: Physical Therapy

## 2010-09-02 DIAGNOSIS — M6281 Muscle weakness (generalized): Secondary | ICD-10-CM | POA: Insufficient documentation

## 2010-09-02 DIAGNOSIS — IMO0001 Reserved for inherently not codable concepts without codable children: Secondary | ICD-10-CM | POA: Insufficient documentation

## 2010-09-11 ENCOUNTER — Telehealth: Payer: Self-pay | Admitting: Gastroenterology

## 2010-09-11 NOTE — Telephone Encounter (Signed)
Pt scheduled for 09/20/10 [name] [name] will call pt

## 2010-09-20 ENCOUNTER — Ambulatory Visit (INDEPENDENT_AMBULATORY_CARE_PROVIDER_SITE_OTHER): Payer: Medicare Other | Admitting: Gastroenterology

## 2010-09-20 ENCOUNTER — Encounter: Payer: Self-pay | Admitting: Gastroenterology

## 2010-09-20 VITALS — BP 114/62 | HR 60

## 2010-09-20 DIAGNOSIS — R1013 Epigastric pain: Secondary | ICD-10-CM

## 2010-09-20 DIAGNOSIS — K3189 Other diseases of stomach and duodenum: Secondary | ICD-10-CM

## 2010-09-20 NOTE — Patient Instructions (Signed)
Smaller more frequent meals is best, rather than 2 larger meals day. Call Dr. Christella Hartigan' office in 4-5 weeks to report on your symptoms. A copy of this information will be made available to Dr. Clelia Croft. Tylenol is safest for abdominal pains.

## 2010-09-20 NOTE — Progress Notes (Signed)
GASTROINTESTINAL PROBLEM LIST:  1. History of colonic adenomas. Colonoscopy January 2007. I removed three colon polyps, one was tubulovillous, 10 mm. Her next colonoscopy has been scheduled for January 2010. Repeat colonoscopy March, 2010 found more adenomas, recall colonoscopy was set at 3 year interval.  2. 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 This is seen by EGD January 2007, small, round. Attempt at a [place] was done two weeks later, but she did not tolerate the procedure very well, due to gagging and discomfort. Repeat EGD January, 2010 found lesion have not changed in size in 3 year interval, still less than 1 cm. Recommend clinical observation only  HPI: This is a  pleasant 70 year old woman my last saw over a year ago.  She feels bloating, left sided sensation: feels gas like sensation in left side of abdomen.  Also has lower abd discomforts.  SHe has nausea at times, no nausea.  All her symptoms bother her more at night. She feels very HOT at times.  Labs 1-2 weeks ago CBC was normal, cmet was normal.  Plain abdominal films were normal.   Review of systems: Pertinent positive and negative review of systems were noted in the above HPI section.  All other review of systems was otherwise negative.   Past Medical History  Diagnosis Date  . Benign neoplasm of colon   . Esophageal reflux   . Polymyositis   . Allergic rhinitis, cause unspecified   . Unspecified essential hypertension     Past Surgical History  Procedure Date  . Lumbar laminectomy   . Pah   . Tonsillectomy      reports that she has never smoked. She has never used smokeless tobacco. She reports that she does not drink alcohol or use illicit drugs.  family history includes Dementia in her mother and Hypertension in her father.    Current Medications, Allergies were all reviewed with the patient via [facility] electronic medical record system.    Physical Exam: BP 114/62  Pulse 60 Constitutional:  generally well-appearing Psychiatric: alert and oriented x3 Eyes: extraocular movements intact Mouth: oral pharynx moist, no lesions Neck: supple no lymphadenopathy Cardiovascular: heart regular rate and rhythm Lungs: clear to auscultation bilaterally Abdomen: soft, nontender, nondistended, no obvious ascites, no peritoneal signs, normal bowel sounds Extremities: no lower extremity edema bilaterally Skin: no lesions on visible extremities    Assessment and plan: 70 y.o. female gas like discomfort on her left side of her abdomen  I suggested smaller more frequent meals.  She will call to report on her symptoms in 4-5 weeks and sooner if needed.

## 2010-10-04 ENCOUNTER — Telehealth: Payer: Self-pay | Admitting: Gastroenterology

## 2010-10-07 ENCOUNTER — Encounter (HOSPITAL_COMMUNITY): Payer: Medicare Other | Attending: Oncology

## 2010-10-07 ENCOUNTER — Encounter (HOSPITAL_COMMUNITY): Payer: Medicare Other

## 2010-10-07 DIAGNOSIS — M810 Age-related osteoporosis without current pathological fracture: Secondary | ICD-10-CM | POA: Insufficient documentation

## 2010-10-08 NOTE — Telephone Encounter (Signed)
PT IS FEELING MUCH BETTER SHE WILL CALL WITH ANY CHANGES

## 2010-10-18 LAB — DIFFERENTIAL
Basophils Absolute: 0.1
Basophils Relative: 0
Eosinophils Absolute: 0 — ABNORMAL LOW
Monocytes Relative: 5
Neutro Abs: 11.5 — ABNORMAL HIGH
Neutrophils Relative %: 86 — ABNORMAL HIGH

## 2010-10-18 LAB — COMPREHENSIVE METABOLIC PANEL
ALT: 44 — ABNORMAL HIGH
AST: 47 — ABNORMAL HIGH
Calcium: 9.5
GFR calc Af Amer: >60
Sodium: 143
Total Protein: 7

## 2010-10-18 LAB — URINALYSIS, ROUTINE W REFLEX MICROSCOPIC
Glucose, UA: NEGATIVE
Hgb urine dipstick: NEGATIVE
Ketones, ur: NEGATIVE
pH: 6

## 2010-10-18 LAB — CBC
HCT: 37.1
Hemoglobin: 12.2
MCV: 80.3
Platelets: 376
RBC: 4.62
RDW: 19.7 — ABNORMAL HIGH
WBC: 13.4 — ABNORMAL HIGH

## 2010-10-18 LAB — URINE CULTURE

## 2010-12-04 ENCOUNTER — Encounter (HOSPITAL_COMMUNITY): Payer: Self-pay | Admitting: *Deleted

## 2010-12-04 ENCOUNTER — Inpatient Hospital Stay (HOSPITAL_COMMUNITY)
Admission: EM | Admit: 2010-12-04 | Discharge: 2010-12-09 | DRG: 378 | Disposition: A | Discharge status: Home / Self Care | Payer: Medicare Other | Attending: Internal Medicine | Admitting: Internal Medicine

## 2010-12-04 DIAGNOSIS — E559 Vitamin D deficiency, unspecified: Secondary | ICD-10-CM | POA: Diagnosis present

## 2010-12-04 DIAGNOSIS — R269 Unspecified abnormalities of gait and mobility: Secondary | ICD-10-CM | POA: Diagnosis present

## 2010-12-04 DIAGNOSIS — IMO0002 Reserved for concepts with insufficient information to code with codable children: Secondary | ICD-10-CM

## 2010-12-04 DIAGNOSIS — D649 Anemia, unspecified: Secondary | ICD-10-CM

## 2010-12-04 DIAGNOSIS — E2749 Other adrenocortical insufficiency: Secondary | ICD-10-CM | POA: Diagnosis present

## 2010-12-04 DIAGNOSIS — K922 Gastrointestinal hemorrhage, unspecified: Principal | ICD-10-CM | POA: Diagnosis present

## 2010-12-04 DIAGNOSIS — M3320 Polymyositis, organ involvement unspecified: Secondary | ICD-10-CM | POA: Diagnosis present

## 2010-12-04 DIAGNOSIS — E785 Hyperlipidemia, unspecified: Secondary | ICD-10-CM | POA: Diagnosis present

## 2010-12-04 DIAGNOSIS — K219 Gastro-esophageal reflux disease without esophagitis: Secondary | ICD-10-CM | POA: Diagnosis present

## 2010-12-04 DIAGNOSIS — I10 Essential (primary) hypertension: Secondary | ICD-10-CM | POA: Diagnosis present

## 2010-12-04 DIAGNOSIS — K921 Melena: Secondary | ICD-10-CM | POA: Diagnosis present

## 2010-12-04 DIAGNOSIS — D62 Acute posthemorrhagic anemia: Secondary | ICD-10-CM | POA: Diagnosis present

## 2010-12-04 DIAGNOSIS — E2740 Unspecified adrenocortical insufficiency: Secondary | ICD-10-CM | POA: Diagnosis present

## 2010-12-04 HISTORY — DX: Hyperlipidemia, unspecified: E78.5

## 2010-12-04 LAB — CBC
HCT: 29.8 % — ABNORMAL LOW (ref 36.0–46.0)
Hemoglobin: 9.5 g/dL — ABNORMAL LOW (ref 12.0–15.0)
MCV: 82.8 fL (ref 78.0–100.0)
WBC: 9.2 10*3/uL (ref 4.0–10.5)

## 2010-12-04 LAB — DIFFERENTIAL
Eosinophils Relative: 1 % (ref 0–5)
Lymphocytes Relative: 23 % (ref 12–46)
Monocytes Absolute: 0.7 10*3/uL (ref 0.1–1.0)
Monocytes Relative: 8 % (ref 3–12)
Neutro Abs: 6.2 10*3/uL (ref 1.7–7.7)

## 2010-12-04 LAB — BASIC METABOLIC PANEL
BUN: 28 mg/dL — ABNORMAL HIGH (ref 6–23)
CO2: 26 mEq/L (ref 19–32)
Chloride: 99 mEq/L (ref 96–112)
Creatinine, Ser: 0.64 mg/dL (ref 0.50–1.10)
Glucose, Bld: 107 mg/dL — ABNORMAL HIGH (ref 70–99)

## 2010-12-04 NOTE — ED Provider Notes (Signed)
I saw and evaluated the patient, reviewed the resident's note and I agree with the findings and plan.   .Face to face Exam:  General:  Awake HEENT:  Atraumatic Resp:  Normal effort Abd:  Nondistended Neuro:No focal weakness Lymph: No adenopathy   Nelia Shi, MD 12/04/10 2326

## 2010-12-04 NOTE — ED Notes (Signed)
IV attempt unsuccessful @ this time, Lab to attempt

## 2010-12-04 NOTE — ED Provider Notes (Signed)
History     CSN: [id number] Arrival date & time: 12/04/2010  6:01 PM   First MD Initiated Contact with Patient 12/04/10 2042      Chief Complaint  Patient presents with  . Rectal Bleeding    bright red since this AM    (Consider location/radiation/quality/duration/timing/severity/associated sxs/prior treatment) HPI  This is a 70 year old female with PMH of colonic adenoma, polymyositis, HTN and GERD who presents to the ED with bloody stools. The history is provided by pt and her daughter. Pt states that she started to have bloody stools at 3 am today, and bright red blood noted in the entire toilet each time. Pt reports that she has had 4 episodes of bloody stools with last one in [facility] ED witnessed by her daughter. Denies history of hemorrhoids. Denies active bleeding.  No headache, fever, or sore throat. No shortness of breath or dyspnea on exertion. No chest pain, chest pressure or palpitation. No nausea, vomiting, or abdominal pain.  No  incontinence. No muscle weakness.  No appetite or weight changes.   Of note, Pt had three colon polyps removed in 2007, and one was tubulovillous 10mm. And repeat colonoscopy found more adenomas in 2010.  Pt has been followed up by Dr. Rob Bunting.  Past Medical History  Diagnosis Date  . Benign neoplasm of colon   . Esophageal reflux   . Polymyositis   . Allergic rhinitis, cause unspecified   . Unspecified essential hypertension     Past Surgical History  Procedure Date  . Lumbar laminectomy   . Pah   . Tonsillectomy     Family History  Problem Relation Age of Onset  . Dementia Mother   . Hypertension Father     History  Substance Use Topics  . Smoking status: Never Smoker   . Smokeless tobacco: Never Used  . Alcohol Use: No    OB History    Grav Para Term Preterm Abortions TAB SAB Ect Mult Living                  Review of Systems See hpi  Allergies  Codeine  Home Medications   Current Outpatient Rx  Name  Route Sig Dispense Refill  . ALPRAZOLAM 0.25 MG PO TABS Oral Take 0.25 mg by mouth 2 (two) times daily. Anxiety and sleep    . VITAMIN C 1000 MG PO TABS Oral Take 1,000 mg by mouth daily.      . ATENOLOL 50 MG PO TABS Oral Take 50 mg by mouth daily.      Marland Kitchen CALCIUM CARBONATE 1500 MG PO TABS Oral Take 2 tablets by mouth daily. Daily     . ERGOCALCIFEROL 50000 UNITS PO CAPS Oral Take 50,000 Units by mouth 2 (two) times a week. Takes 1 tablet every Monday and friday    . ESOMEPRAZOLE MAGNESIUM 40 MG PO CPDR Oral Take 40 mg by mouth daily before breakfast.      . OMEGA-3 FATTY ACIDS 1000 MG PO CAPS Oral Take 1 g by mouth daily.      Marland Kitchen FOLIC ACID 400 MCG PO TABS Oral Take 400 mcg by mouth daily.      . FUROSEMIDE 40 MG PO TABS Oral Take 40 mg by mouth daily.      Marland Kitchen GLUCOSAMINE CHONDR 1500 COMPLX PO CAPS Oral Take 2 capsules by mouth daily. Daily     . NIACIN (ANTIHYPERLIPIDEMIC) 500 MG PO TBCR Oral Take 500 mg by mouth at bedtime.      Marland Kitchen  POTASSIUM CHLORIDE CRYS CR 20 MEQ PO TBCR Oral Take 40 mEq by mouth daily.     Marland Kitchen PREDNISONE 10 MG PO TABS Oral Take 10 mg by mouth daily.      Marland Kitchen VITAMIN B-12 1000 MCG PO TABS Oral Take 1,000 mcg by mouth daily.      Marland Kitchen VITAMIN E 600 UNITS PO CAPS Oral Take 600 Units by mouth daily.      . NON FORMULARY  Blue manna  400 mg daily       BP 119/68  Pulse 78  Temp(Src) 99.2 F (37.3 C) (Oral)  Resp 20  SpO2 100%  Physical Exam General: alert, well-developed, and cooperative to examination.  Head: normocephalic and atraumatic.  Eyes: vision grossly intact, pupils equal, pupils round, pupils reactive to light, no injection and anicteric.  Mouth: pharynx pink and moist, no erythema, and no exudates.  Neck: supple, full ROM, no thyromegaly, no JVD, and no carotid bruits.  Lungs: normal respiratory effort, no accessory muscle use, normal breath sounds, no crackles, and no wheezes. Heart: normal rate, regular rhythm, no murmur, no gallop, and no rub.  Abdomen:  soft, non-tender, normal bowel sounds, no distention, no guarding, no rebound tenderness, no hepatomegaly, and no splenomegaly.  Msk: no joint swelling, no joint warmth, and no redness over joints.  Pulses: 2+ DP/PT pulses bilaterally Extremities: No cyanosis, clubbing, edema Neurologic: alert & oriented X3, cranial nerves II-XII intact, strength normal in all extremities, sensation intact to light touch, and gait normal.  Skin: turgor normal and no rashes.  Psych: Oriented X3, memory intact for recent and remote, normally interactive, good eye contact, not anxious appearing, and not depressed appearing.   ED Course  Procedures (including critical care time)     Results for orders placed during the hospital encounter of 12/04/10  CBC      Component Value Range   WBC 9.2  4.0 - 10.5 (K/uL)   RBC 3.60 (*) 3.87 - 5.11 (MIL/uL)   Hemoglobin 9.5 (*) 12.0 - 15.0 (g/dL)   HCT 16.1 (*) 09.6 - 46.0 (%)   MCV 82.8  78.0 - 100.0 (fL)   MCH 26.4  26.0 - 34.0 (pg)   MCHC 31.9  30.0 - 36.0 (g/dL)   RDW 04.5 (*) 40.9 - 15.5 (%)   Platelets 296  150 - 400 (K/uL)  DIFFERENTIAL      Component Value Range   Neutrophils Relative 68  43 - 77 (%)   Neutro Abs 6.2  1.7 - 7.7 (K/uL)   Lymphocytes Relative 23  12 - 46 (%)   Lymphs Abs 2.1  0.7 - 4.0 (K/uL)   Monocytes Relative 8  3 - 12 (%)   Monocytes Absolute 0.7  0.1 - 1.0 (K/uL)   Eosinophils Relative 1  0 - 5 (%)   Eosinophils Absolute 0.1  0.0 - 0.7 (K/uL)   Basophils Relative 0  0 - 1 (%)   Basophils Absolute 0.0  0.0 - 0.1 (K/uL)  BASIC METABOLIC PANEL      Component Value Range   Sodium 137  135 - 145 (mEq/L)   Potassium 5.0  3.5 - 5.1 (mEq/L)   Chloride 99  96 - 112 (mEq/L)   CO2 26  19 - 32 (mEq/L)   Glucose, Bld 107 (*) 70 - 99 (mg/dL)   BUN 28 (*) 6 - 23 (mg/dL)   Creatinine, Ser 8.11  0.50 - 1.10 (mg/dL)   Calcium 91.4  8.4 - 10.5 (mg/dL)  GFR calc non Af Amer 88 (*) >90 (mL/min)   GFR calc Af Amer >90  >90 (mL/min)  PROTIME-INR       Component Value Range   Prothrombin Time 13.0  11.6 - 15.2 (seconds)   INR 0.96  0.00 - 1.49   APTT      Component Value Range   aPTT 23 (*) 24 - 37 (seconds)   No results found.      MDM  1. Rectal bleeding Pt presents with rectal bleeding for one day, bright red blood noted.No other S/S. Given her history of colonic adenoma and one was tubulovillous in 2007, and more adenomas found in 2010, Colon adenoma or cancer can not  Be ruled out. - will check labs - will likely admit pt to inpt service.              Dede Query, MD 12/04/10 2309

## 2010-12-04 NOTE — ED Provider Notes (Signed)
I spoke with Dr. Juanda Chance and Dr. Jarold Motto.  Dr. Jarold Motto will come and admit for observation  Nelia Shi, MD 12/04/10 2326

## 2010-12-04 NOTE — ED Notes (Signed)
Pt states she has had bright red rectal bleeding since this morning.

## 2010-12-04 NOTE — ED Notes (Signed)
Ortho Vital Signs were completed Pt. Was lying and sitting but due to Pt. Being bound to wheelchair Pt. Was unable to stand.

## 2010-12-04 NOTE — ED Notes (Signed)
Pt reports bright red rectal bleeding since last night. Has had 3 BMs today.  Pt denies any hemorrhoids.  Pt also denies any abd pain or n/v.

## 2010-12-05 DIAGNOSIS — M810 Age-related osteoporosis without current pathological fracture: Secondary | ICD-10-CM | POA: Insufficient documentation

## 2010-12-05 DIAGNOSIS — K219 Gastro-esophageal reflux disease without esophagitis: Secondary | ICD-10-CM | POA: Diagnosis present

## 2010-12-05 DIAGNOSIS — K921 Melena: Secondary | ICD-10-CM | POA: Diagnosis present

## 2010-12-05 DIAGNOSIS — D649 Anemia, unspecified: Secondary | ICD-10-CM | POA: Insufficient documentation

## 2010-12-05 DIAGNOSIS — E2740 Unspecified adrenocortical insufficiency: Secondary | ICD-10-CM | POA: Diagnosis present

## 2010-12-05 LAB — CBC
Hemoglobin: 9.1 g/dL — ABNORMAL LOW (ref 12.0–15.0)
MCH: 26.8 pg (ref 26.0–34.0)
MCHC: 32.7 g/dL (ref 30.0–36.0)
RDW: 17.5 % — ABNORMAL HIGH (ref 11.5–15.5)

## 2010-12-05 LAB — COMPREHENSIVE METABOLIC PANEL
ALT: 24 U/L (ref 0–35)
AST: 28 U/L (ref 0–37)
Alkaline Phosphatase: 54 U/L (ref 39–117)
CO2: 24 mEq/L (ref 19–32)
Chloride: 101 mEq/L (ref 96–112)
GFR calc Af Amer: >90 mL/min (ref >90)
GFR calc non Af Amer: 84 mL/min — ABNORMAL LOW (ref >90)
Glucose, Bld: 84 mg/dL (ref 70–99)
Potassium: 4 mEq/L (ref 3.5–5.1)
Sodium: 138 mEq/L (ref 135–145)
Total Bilirubin: 0.3 mg/dL (ref 0.3–1.2)

## 2010-12-05 MED ORDER — PANTOPRAZOLE SODIUM 40 MG PO TBEC
40.0000 mg | DELAYED_RELEASE_TABLET | Freq: Every day | ORAL | Status: DC
  Administered 2010-12-05 – 2010-12-09 (×5): 40 mg via ORAL
  Filled 2010-12-05 (×5): qty 1

## 2010-12-05 MED ORDER — VITAMIN E 180 MG (400 UNIT) PO CAPS
400.0000 [IU] | ORAL_CAPSULE | Freq: Every day | ORAL | Status: DC
  Administered 2010-12-05 – 2010-12-09 (×5): 400 [IU] via ORAL
  Filled 2010-12-05 (×5): qty 1

## 2010-12-05 MED ORDER — FOLIC ACID 400 MCG PO TABS
400.0000 ug | ORAL_TABLET | Freq: Every day | ORAL | Status: DC
  Filled 2010-12-05: qty 1

## 2010-12-05 MED ORDER — VITAMIN E 45 MG (100 UNIT) PO CAPS: 600.0000 [IU] | ORAL_CAPSULE | Freq: Every day | ORAL | Status: DC

## 2010-12-05 MED ORDER — PROMETHAZINE HCL 25 MG/ML IJ SOLN: 12.5000 mg | Freq: Four times a day (QID) | INTRAMUSCULAR | Status: DC | PRN

## 2010-12-05 MED ORDER — CYANOCOBALAMIN 500 MCG PO TABS
1000.0000 ug | ORAL_TABLET | Freq: Every day | ORAL | Status: DC
  Administered 2010-12-05 – 2010-12-09 (×5): 1000 ug via ORAL
  Filled 2010-12-05 (×5): qty 1
  Filled 2010-12-05: qty 2

## 2010-12-05 MED ORDER — SENNA 8.6 MG PO TABS: 2.0000 | ORAL_TABLET | Freq: Every day | ORAL | Status: DC | PRN

## 2010-12-05 MED ORDER — ALPRAZOLAM 0.25 MG PO TABS
0.2500 mg | ORAL_TABLET | Freq: Two times a day (BID) | ORAL | Status: DC
  Administered 2010-12-05 – 2010-12-09 (×10): 0.25 mg via ORAL
  Filled 2010-12-05 (×9): qty 1

## 2010-12-05 MED ORDER — POTASSIUM CHLORIDE IN NACL 20-0.9 MEQ/L-% IV SOLN
INTRAVENOUS | Status: DC
  Administered 2010-12-05 – 2010-12-07 (×4): via INTRAVENOUS
  Filled 2010-12-05 (×6): qty 1000

## 2010-12-05 MED ORDER — GLUCOSAMINE CHONDR 1500 COMPLX PO CAPS: 2.0000 | ORAL_CAPSULE | Freq: Every day | ORAL | Status: DC

## 2010-12-05 MED ORDER — ALUM & MAG HYDROXIDE-SIMETH 200-200-20 MG/5ML PO SUSP: 30.0000 mL | Freq: Four times a day (QID) | ORAL | Status: DC | PRN

## 2010-12-05 MED ORDER — PREDNISOLONE 5 MG PO TABS
10.0000 mg | ORAL_TABLET | Freq: Every day | ORAL | Status: DC
  Administered 2010-12-05: 10 mg via ORAL
  Filled 2010-12-05 (×3): qty 2

## 2010-12-05 MED ORDER — FOLIC ACID 1 MG PO TABS
1.0000 mg | ORAL_TABLET | Freq: Every day | ORAL | Status: DC
  Administered 2010-12-05 – 2010-12-09 (×5): 1 mg via ORAL
  Filled 2010-12-05 (×5): qty 1

## 2010-12-05 MED ORDER — POTASSIUM CHLORIDE CRYS ER 20 MEQ PO TBCR
40.0000 meq | EXTENDED_RELEASE_TABLET | Freq: Every day | ORAL | Status: DC
  Administered 2010-12-05 – 2010-12-09 (×5): 40 meq via ORAL
  Filled 2010-12-05 (×5): qty 2

## 2010-12-05 MED ORDER — PROMETHAZINE HCL 25 MG PO TABS: 12.5000 mg | ORAL_TABLET | Freq: Four times a day (QID) | ORAL | Status: DC | PRN

## 2010-12-05 MED ORDER — NIACIN ER (ANTIHYPERLIPIDEMIC) 500 MG PO TBCR
500.0000 mg | EXTENDED_RELEASE_TABLET | Freq: Every day | ORAL | Status: DC
  Administered 2010-12-06 – 2010-12-08 (×3): 500 mg via ORAL
  Filled 2010-12-05 (×5): qty 1

## 2010-12-05 MED ORDER — ZOLPIDEM TARTRATE 5 MG PO TABS
5.0000 mg | ORAL_TABLET | Freq: Every evening | ORAL | Status: DC | PRN
  Administered 2010-12-07 – 2010-12-08 (×2): 5 mg via ORAL
  Filled 2010-12-05 (×2): qty 1

## 2010-12-05 MED ORDER — ACETAMINOPHEN 325 MG PO TABS
650.0000 mg | ORAL_TABLET | Freq: Four times a day (QID) | ORAL | Status: DC | PRN
  Administered 2010-12-06 – 2010-12-09 (×3): 650 mg via ORAL
  Filled 2010-12-05 (×3): qty 2

## 2010-12-05 MED ORDER — ERGOCALCIFEROL 1.25 MG (50000 UT) PO CAPS
50000.0000 [IU] | ORAL_CAPSULE | ORAL | Status: DC
  Administered 2010-12-09: 50000 [IU] via ORAL
  Filled 2010-12-05 (×2): qty 1

## 2010-12-05 MED ORDER — PREDNISONE 20 MG PO TABS
10.0000 mg | ORAL_TABLET | Freq: Every day | ORAL | Status: DC
  Administered 2010-12-05: 10 mg via ORAL
  Filled 2010-12-05: qty 0.5

## 2010-12-05 MED ORDER — ACETAMINOPHEN 650 MG RE SUPP: 650.0000 mg | Freq: Four times a day (QID) | RECTAL | Status: DC | PRN

## 2010-12-05 MED ORDER — ATENOLOL 50 MG PO TABS
50.0000 mg | ORAL_TABLET | Freq: Every day | ORAL | Status: DC
  Administered 2010-12-05 – 2010-12-09 (×5): 50 mg via ORAL
  Filled 2010-12-05 (×6): qty 1

## 2010-12-05 NOTE — Progress Notes (Signed)
PHARMACIST - PHYSICIAN ORDER COMMUNICATION  CONCERNING: P&T Medication Policy on Herbal Medications  DESCRIPTION:  This patient's order for:  Glucosamine chondroitin  has been noted.  This product(s) is classified as an "herbal" or natural product. Due to a lack of definitive safety studies or FDA approval, nonstandard manufacturing practices, plus the potential risk of unknown drug-drug interactions while on inpatient medications, the Pharmacy and Therapeutics Committee does not permit the use of "herbal" or natural products of this type within [facility].   ACTION TAKEN: The pharmacy department is unable to verify this order at this time and your patient has been informed of this safety policy. Please reevaluate patient's clinical condition at discharge and address if the herbal or natural product(s) should be resumed at that time.  [name] [name], Loma Messing 12/05/2010 1:38 PM

## 2010-12-05 NOTE — H&P (Signed)
.[name] is an 70 y.o. female.   Chief Complaint: Rectal bleeding HPI:  The patient is a 70 year old woman with several medical problems who was in her usual state of somewhat compromised health until about 3 AM yesterday when she began to have moderate volume rectal bleeding with bright red blood. She had 3 more episodes yesterday with the last episode around 1800 hrs. With these episodes she did not have dizziness, shortness of breath, or palpitations. She has a history of any upper GI bleed in March 2011 with endoscopy showing a submucosal lesion of uncertain etiology. Her last colonoscopy was done in March 2012 and revealed adenoma polyps. Her medical history is most significant for polymyositis that requires her to use prednisone 10 mg daily, hyperlipidemia, hypertension, gastroesophageal reflux disease, osteoporosis, and vitamin D deficiency. 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 She has never had problems with a lower GI bleed. She is followed closely by her GI physician.   Past Medical History  Diagnosis Date  . Benign neoplasm of colon   . Esophageal reflux   . Polymyositis   . Allergic rhinitis, cause unspecified   . 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   Medications Prior to Admission  Medication Sig Dispense Refill  . ALPRAZolam (XANAX) 0.25 MG tablet Take 0.25 mg by mouth  2 (two) times daily. Anxiety and sleep      . Ascorbic Acid (VITAMIN C) 1000 MG tablet Take 1,000 mg by mouth daily.        Marland Kitchen atenolol (TENORMIN) 50 MG tablet Take 50 mg by mouth daily.        . Calcium Carbonate (CALTRATE 600) 1500 MG TABS Take 2 tablets by mouth daily. Daily       . ergocalciferol (VITAMIN D2) 50000 UNITS capsule Take 50,000 Units by mouth 2 (two) times a week. Takes 1 tablet every Monday and friday      . esomeprazole (NEXIUM) 40 MG capsule Take 40 mg by mouth daily before breakfast.        . fish oil-omega-3 fatty acids 1000 MG capsule Take 1 g by mouth daily.        . folic acid (FOLVITE) 400 MCG tablet Take 400 mcg by mouth daily.        . furosemide (LASIX) 40 MG tablet Take 40 mg by mouth daily.        . Glucosamine-Chondroit-Vit C-Mn (GLUCOSAMINE CHONDR 1500 COMPLX) CAPS Take 2 capsules by mouth daily. Daily       . niacin (NIASPAN) 500 MG CR tablet Take 500 mg by mouth at bedtime.        . potassium chloride SA (K-DUR,KLOR-CON) 20 MEQ tablet Take 40 mEq by mouth daily.       . predniSONE (DELTASONE) 10 MG tablet Take 10 mg by mouth daily.        . vitamin B-12 (CYANOCOBALAMIN) 1000 MCG tablet Take 1,000 mcg by mouth daily.        Marland Kitchen  vitamin E 600 UNIT capsule Take 600 Units by mouth daily.        . NON FORMULARY Blue manna  400 mg daily         Past Surgical History  Procedure Date  . Remote Tonsillectomy   . 1980 TAH   . 1998 Lumbar Spine Surgery 9/11 Left Rotator Cuff Repair 2/12 Right Rotator Cuff Repair    PHYSICIANS INVOLVED IN CARE: Buren Kos, Rob Bunting, Shali Deveshwar, Clint Young  Family History  Problem Relation Age of Onset  . Dementia Mother   . Hypertension Father      Social History:  reports that she has never smoked. She has never used smokeless tobacco. She reports that she does not drink alcohol or use illicit drugs. she is separated. She has 5 children. A son and daughter live in [place] nearby. Both his daughter and  daughter-in-law as well as grandchildren stop by frequently to check in on her. She also has a visiting nurse who stops by occasionally. She has no history of tobacco or alcohol abuse. She is disabled because of chronic low back pain.  Allergies:  Allergies  Allergen Reactions  . Codeine Other (See Comments)    Feels drunk   ineffective medications included CellCept, methotrexate, and IV immunoglobulin G.   ROS: No recent fever, chills, productive cough, chest pain or palpitations, abdominal pain, nausea, vomiting, diarrhea, constipation, dysuria, frequency, anxiety, or depression. She has chronic weakness in both thighs and both shoulders. She uses a scooter to ambulate. She has not had any recent falls.  PHYSICAL EXAM: Blood pressure 108/65, pulse 76, temperature 99.2 F (37.3 C), temperature source Oral, resp. rate 19, SpO2 99.00%. In general, she is a cushingoid appearing black woman who was in no apparent distress was sitting upright on her bed. HEENT exam was otherwise unremarkable, neck was supple without jugular venous distention or carotid bruit, chest was clear to auscultation, heart had a regular rate and rhythm, abdomen had normal bowel sounds no hepatosplenomegaly or tenderness, rectal exam done by another physician showed bright red blood on the examining finger, extremities were without cyanosis, clubbing, or edema. Neurologic exam: She was alert and well oriented with normal affect, cranial nerves II through XII are normal, sensory exam was normal, motor strength was significant for grade 4/5 bilateral hip flexor strength. Cerebellar function and gait were not assessed.  Results for orders placed during the hospital encounter of 12/04/10 (from the past 48 hour(s))  CBC     Status: Abnormal   Collection Time   12/04/10  9:37 PM      Component Value Range Comment   WBC 9.2  4.0 - 10.5 (K/uL)    RBC 3.60 (*) 3.87 - 5.11 (MIL/uL)    Hemoglobin 9.5 (*) 12.0 - 15.0 (g/dL)    HCT  25.3 (*) 66.4 - 46.0 (%)    MCV 82.8  78.0 - 100.0 (fL)    MCH 26.4  26.0 - 34.0 (pg)    MCHC 31.9  30.0 - 36.0 (g/dL)    RDW 40.3 (*) 47.4 - 15.5 (%)    Platelets 296  150 - 400 (K/uL)   DIFFERENTIAL     Status: Normal   Collection Time   12/04/10  9:37 PM      Component Value Range Comment   Neutrophils Relative 68  43 - 77 (%)    Neutro Abs 6.2  1.7 - 7.7 (K/uL)    Lymphocytes Relative 23  12 - 46 (%)  Lymphs Abs 2.1  0.7 - 4.0 (K/uL)    Monocytes Relative 8  3 - 12 (%)    Monocytes Absolute 0.7  0.1 - 1.0 (K/uL)    Eosinophils Relative 1  0 - 5 (%)    Eosinophils Absolute 0.1  0.0 - 0.7 (K/uL)    Basophils Relative 0  0 - 1 (%)    Basophils Absolute 0.0  0.0 - 0.1 (K/uL)   BASIC METABOLIC PANEL     Status: Abnormal   Collection Time   12/04/10  9:37 PM      Component Value Range Comment   Sodium 137  135 - 145 (mEq/L)    Potassium 5.0  3.5 - 5.1 (mEq/L)    Chloride 99  96 - 112 (mEq/L)    CO2 26  19 - 32 (mEq/L)    Glucose, Bld 107 (*) 70 - 99 (mg/dL)    BUN 28 (*) 6 - 23 (mg/dL)    Creatinine, Ser 4.09  0.50 - 1.10 (mg/dL)    Calcium 81.1  8.4 - 10.5 (mg/dL)    GFR calc non Af Amer 88 (*) >90 (mL/min)    GFR calc Af Amer >90  >90 (mL/min)   PROTIME-INR     Status: Normal   Collection Time   12/04/10  9:37 PM      Component Value Range Comment   Prothrombin Time 13.0  11.6 - 15.2 (seconds)    INR 0.96  0.00 - 1.49    APTT     Status: Abnormal   Collection Time   12/04/10  9:37 PM      Component Value Range Comment   aPTT 23 (*) 24 - 37 (seconds)    No results found.   Assessment/Plan  #1 Hematochezia: This is most likely from diverticular bleeding given the moderate volume described as well as the current anemia. Her lower GI bleeding is less likely from ischemic colitis or neoplasm given that she had a colonoscopy done in 2010 with resection of small polyps. Her bleeding is not likely from an upper GI source given the normal BUN level. We will follow her CBC  closely and repeat a complete metabolic panel in the morning. A GI consultant will also evaluate her for the possibility of a diagnostic colonoscopy in the near future. With her last upper GI bleed in 2011 she declined blood transfusions. #2 Anemia: Moderately severe and relatively asymptomatic. We'll follow her CBCs closely as well as her symptoms and consider encouraging her to have a transfusion if necessary. #3 Adrenal Insufficiency: She is clinically stable on prednisone 10 mg daily which will be continued during her hospitalization. If she shows evidence of adrenal insufficiency such as persistent hypotension with nausea and vomiting then we will increase her corticosteroid dosing. #4 Hypertension: Well controlled on current medications #5 Polymyositis: Stable on current medications. She apparently has significant hip flexor weakness that led to needing a scooter to ambulate safely. Fortunately, she has not had any recent falls. #6 Osteoporosis: Stable on calcium with vitamin D and annual Reclast IV infusions  Zuriel Roskos G 12/05/2010, 1:28 AM

## 2010-12-06 ENCOUNTER — Encounter (HOSPITAL_COMMUNITY): Payer: Self-pay

## 2010-12-06 DIAGNOSIS — K922 Gastrointestinal hemorrhage, unspecified: Secondary | ICD-10-CM

## 2010-12-06 DIAGNOSIS — D62 Acute posthemorrhagic anemia: Secondary | ICD-10-CM | POA: Diagnosis present

## 2010-12-06 LAB — COMPREHENSIVE METABOLIC PANEL
ALT: 20 U/L (ref 0–35)
AST: 23 U/L (ref 0–37)
Albumin: 2.9 g/dL — ABNORMAL LOW (ref 3.5–5.2)
Calcium: 9.3 mg/dL (ref 8.4–10.5)
Chloride: 106 mEq/L (ref 96–112)
Creatinine, Ser: 0.67 mg/dL (ref 0.50–1.10)
Sodium: 139 mEq/L (ref 135–145)
Total Bilirubin: 0.2 mg/dL — ABNORMAL LOW (ref 0.3–1.2)

## 2010-12-06 LAB — CBC
MCV: 81.6 fL (ref 78.0–100.0)
Platelets: 255 10*3/uL (ref 150–400)
RDW: 17.4 % — ABNORMAL HIGH (ref 11.5–15.5)
WBC: 8.7 10*3/uL (ref 4.0–10.5)

## 2010-12-06 MED ORDER — DIPHENHYDRAMINE HCL 25 MG PO CAPS: 25.0000 mg | ORAL_CAPSULE | Freq: Once | ORAL | Status: DC

## 2010-12-06 MED ORDER — ACETAMINOPHEN 325 MG PO TABS: 650.0000 mg | ORAL_TABLET | Freq: Once | ORAL | Status: DC

## 2010-12-06 MED ORDER — VITAMINS A & D EX OINT
TOPICAL_OINTMENT | CUTANEOUS | Status: AC
  Administered 2010-12-06: 21:00:00
  Filled 2010-12-06: qty 5

## 2010-12-06 MED ORDER — PREDNISONE 50 MG PO TABS
60.0000 mg | ORAL_TABLET | Freq: Every day | ORAL | Status: DC
  Administered 2010-12-06 – 2010-12-07 (×2): 60 mg via ORAL
  Filled 2010-12-06 (×2): qty 1

## 2010-12-06 NOTE — Consult Note (Signed)
I have reviewed the above note, examined the patient and agree with plan of treatment. Large volume painless rectal bleeding likely diverticular although no diverticula noted on recent colonoscopy. We will observe for further bleeding and consider SBCE to evaluate for a small bowl bleed, Bun slightly elevated in proportion to the creat but seems to be stable for past 3 values.Full liquids are OK

## 2010-12-06 NOTE — Progress Notes (Signed)
Subjective: No complaints.  No BM in past 24 hours.  Denies abdominal pain/cramping.  Worried about getting a transfusion as her brother contracted HIV from a transfusion many years ago.  Denies lightheadedness, chest pain.    Objective: Vital signs in last 24 hours: Temp:  [97.5 F (36.4 C)-98.3 F (36.8 C)] 97.5 F (36.4 C) (11/23 0351) Pulse Rate:  [80-98] 89  (11/23 0600) Resp:  [16-25] 23  (11/23 0600) BP: (108-141)/(50-106) 126/62 mmHg (11/23 0600) SpO2:  [97 %-100 %] 100 % (11/23 0600) Weight:  [83.4 kg (183 lb 13.8 oz)] 183 lb 13.8 oz (83.4 kg) (11/22 2302) Weight change:  Last BM Date: 12/05/10 (pt reports blood in stool before admission- no BM since 1800)  CBG (last 3)  No results found for this basename: GLUCAP:3 in the last 72 hours  Intake/Output from previous day: 11/22 0701 - 11/23 0700 In: 1300 [I.V.:1300] Out: 750 [Urine:750] Intake/Output this shift:    General appearance: alert and no distress Eyes: no scleral icterus Throat: oropharynx moist without erythema Resp: clear to auscultation bilaterally Cardio: regular rate and rhythm, S1, S2 normal, no murmur, click, rub or gallop GI: soft, non-tender; bowel sounds normal; no masses,  no organomegaly Extremities: no clubbing, cyanosis or edema.  SCDs in place Neurologic: bilateral proximal arm weakness (chronic)   Lab Results:  [facility] 12/06/10 0315 12/05/10 0605  NA 139 138  K 4.5 4.0  CL 106 101  CO2 23 24  GLUCOSE 78 84  BUN 25* 33*  CREATININE 0.67 0.72  CALCIUM 9.3 9.8  MG -- --  PHOS -- --    Basename 12/06/10 0315 12/05/10 0605  AST 23 28  ALT 20 24  ALKPHOS 51 54  BILITOT 0.2* 0.3  PROT 5.8* 6.5  ALBUMIN 2.9* 3.2*    Basename 12/06/10 0315 12/05/10 0605 12/04/10 2137  WBC 8.7 9.9 --  NEUTROABS -- -- 6.2  HGB 7.9* 9.1* --  HCT 23.1* 27.8* --  MCV 81.6 81.8 --  PLT 255 315 --   Lab Results  Component Value Date   INR 0.96 12/04/2010   No results found for this basename:  CKTOTAL:3,CKMB:3,CKMBINDEX:3,TROPONINI:3 in the last 72 hours No results found for this basename: TSH,T4TOTAL,FREET3,T3FREE,THYROIDAB in the last 72 hours No results found for this basename: VITAMINB12:2,FOLATE:2,FERRITIN:2,TIBC:2,IRON:2,RETICCTPCT:2 in the last 72 hours  Studies/Results: No results found.   Medications: Scheduled:   . ALPRAZolam  0.25 mg Oral BID  . atenolol  50 mg Oral Daily  . ergocalciferol  50,000 Units Oral 2 times weekly  . folic acid  1 mg Oral Daily  . niacin  500 mg Oral QHS  . pantoprazole  40 mg Oral Daily  . potassium chloride SA  40 mEq Oral Daily  . prednisoLONE  10 mg Oral Daily  . vitamin B-12  1,000 mcg Oral Daily  . vitamin E  400 Units Oral Daily  . DISCONTD: folic acid  400 mcg Oral Daily  . DISCONTD: Glucosamine Chondr 1500 Complx  2 capsule Oral Daily  . DISCONTD: predniSONE  10 mg Oral Daily  . DISCONTD: vitamin E  600 Units Oral Daily   Continuous:   . 0.9 % NaCl with KCl 20 mEq / L 75 mL/hr at 12/06/10 0300    Assessment/Plan: Principal Problem: 1. *Hematochezia- improved.  Absence of pain and recent colonocsopy most consistent with diverticular bleed.  Will consult GI to consider further evaluation.  Monitor hemoglobin/hematocrit.   Active Problems:  2. Adrenal insufficiency- continue current steroid  dose.  No evidence of adrenal crisis.  BP stable. 3. Acute blood loss anemia- Hemoglobin has dropped ~2 grams from admission (some dilution effect).  Suggested transfusion but she is apprehensive given brother's prior HIV infection from transfusion.  Discussed risks/benefits of transfusion.  Will hold off unless Hg drops further or she becomes more symptomatic.  Obtain anemia profile and consider IV iron transfusion. 4. Polymyositis- continue home steroid dose. 5. Disposition- transfer to floor.  Will advance diet after seen by GI.      LOS: 2 days   SHAW,W DOUGLAS 12/06/2010, 7:50 AM

## 2010-12-06 NOTE — Consult Note (Signed)
[facility] Consultation  Referring Provider: Buren Kos, MD Primary Care Physician:  Kari Baars, MD, MD Primary Gastroenterologist:   Wendall Papa, MD Reason for Consultation:  Lower GI bleed  HPI: [name] [name] is a 70 y.o. female known to [place] for a history of erosive gastritis and adenomatous colon polyps. She was last seen in the office September 2012 and that was for gas, lower abdominal discomfort and occasional nausea. Small frequent meals recommended. Patient woke up early Wednesday morning with urge to defecate, she passed a mixture of blood and stool. Around lunch time she had another episode and a third episode later that afternoon. It was at this point patient came to ER. She never had any nausea, SOB, dizziness or chest pain. She actually feels well. She hasn't had any further bleeding or BMs since admission yesterday. No prior history of GI bleeding. She denies constipation. No NSAID use.    Past Medical History  Diagnosis Date  . Adenomatous colon polyps 2007, 2010  . Esophageal reflux   . Polymyositis   . Allergic rhinitis, cause unspecified   . Unspecified essential hypertension   . Hyperlipidemia   . Osteoporosis   . Vitamin D deficiency     Past Surgical History  Procedure Date  . Lumbar laminectomy   . Pah   . Tonsillectomy     Prior to Admission medications   Medication Sig Start Date End Date Taking? Authorizing Provider  ALPRAZolam (XANAX) 0.25 MG tablet Take 0.25 mg by mouth 2 (two) times daily. Anxiety and sleep   Yes Historical Provider, MD  Ascorbic Acid (VITAMIN C) 1000 MG tablet Take 1,000 mg by mouth daily.     Yes Historical Provider, MD  atenolol (TENORMIN) 50 MG tablet Take 50 mg by mouth daily.     Yes Historical Provider, MD  Calcium Carbonate (CALTRATE 600) 1500 MG TABS Take 2 tablets by mouth daily. Daily    Yes Historical Provider, MD  ergocalciferol (VITAMIN D2) 50000 UNITS capsule Take 50,000 Units by mouth 2 (two) times a  week. Takes 1 tablet every Monday and friday   Yes Historical Provider, MD  esomeprazole (NEXIUM) 40 MG capsule Take 40 mg by mouth daily before breakfast.     Yes Historical Provider, MD  fish oil-omega-3 fatty acids 1000 MG capsule Take 1 g by mouth daily.     Yes Historical Provider, MD  folic acid (FOLVITE) 400 MCG tablet Take 400 mcg by mouth daily.     Yes Historical Provider, MD  furosemide (LASIX) 40 MG tablet Take 40 mg by mouth daily.     Yes Historical Provider, MD  Glucosamine-Chondroit-Vit C-Mn (GLUCOSAMINE CHONDR 1500 COMPLX) CAPS Take 2 capsules by mouth daily. Daily    Yes Historical Provider, MD  niacin (NIASPAN) 500 MG CR tablet Take 500 mg by mouth at bedtime.     Yes Historical Provider, MD  potassium chloride SA (K-DUR,KLOR-CON) 20 MEQ tablet Take 40 mEq by mouth daily.    Yes Historical Provider, MD  predniSONE (DELTASONE) 10 MG tablet Take 10 mg by mouth daily.     Yes Historical Provider, MD  vitamin B-12 (CYANOCOBALAMIN) 1000 MCG tablet Take 1,000 mcg by mouth daily.     Yes Historical Provider, MD  vitamin E 600 UNIT capsule Take 600 Units by mouth daily.     Yes Historical Provider, MD  NON FORMULARY Blue manna  400 mg daily     Historical Provider, MD    Current Facility-Administered Medications  Medication Dose Route Frequency Provider Last Rate Last Dose  . 0.9 % NaCl with KCl 20 mEq/ L  infusion   Intravenous Continuous Garlan Fillers, MD 75 mL/hr at 12/06/10 0300    . acetaminophen (TYLENOL) tablet 650 mg  650 mg Oral Q6H PRN Garlan Fillers, MD       Or  . acetaminophen (TYLENOL) suppository 650 mg  650 mg Rectal Q6H PRN Garlan Fillers, MD      . ALPRAZolam Prudy Feeler) tablet 0.25 mg  0.25 mg Oral BID Garlan Fillers, MD   0.25 mg at 12/06/10 1610  . alum & mag hydroxide-simeth (MAALOX/MYLANTA) 200-200-20 MG/5ML suspension 30 mL  30 mL Oral Q6H PRN Garlan Fillers, MD      . atenolol (TENORMIN) tablet 50 mg  50 mg Oral Daily Garlan Fillers, MD   50  mg at 12/06/10 0948  . ergocalciferol (VITAMIN D2) capsule 50,000 Units  50,000 Units Oral 2 times weekly Garlan Fillers, MD      . folic acid (FOLVITE) tablet 1 mg  1 mg Oral Daily W Buren Kos, MD   1 mg at 12/06/10 0950  . niacin (NIASPAN) CR tablet 500 mg  500 mg Oral QHS Garlan Fillers, MD      . pantoprazole (PROTONIX) EC tablet 40 mg  40 mg Oral Daily Garlan Fillers, MD   40 mg at 12/06/10 0948  . potassium chloride SA (K-DUR,KLOR-CON) CR tablet 40 mEq  40 mEq Oral Daily Garlan Fillers, MD   40 mEq at 12/06/10 0948  . prednisoLONE tablet 10 mg  10 mg Oral Daily W Buren Kos, MD   10 mg at 12/05/10 1444  . promethazine (PHENERGAN) tablet 12.5 mg  12.5 mg Oral Q6H PRN Garlan Fillers, MD       Or  . promethazine (PHENERGAN) injection 12.5 mg  12.5 mg Intravenous Q6H PRN Garlan Fillers, MD      . senna [facility]) tablet 17.2 mg  2 tablet Oral Daily PRN Garlan Fillers, MD      . vitamin B-12 (CYANOCOBALAMIN) tablet 1,000 mcg  1,000 mcg Oral Daily Garlan Fillers, MD   1,000 mcg at 12/06/10 0947  . vitamin E capsule 400 Units  400 Units Oral Daily Kari Baars, MD   400 Units at 12/06/10 0947  . zolpidem (AMBIEN) tablet 5 mg  5 mg Oral QHS PRN Garlan Fillers, MD      . DISCONTD: folic acid (FOLVITE) tablet 400 mcg  400 mcg Oral Daily Garlan Fillers, MD      . DISCONTD: Glucosamine Chondr 1500 Complx CAPS 2 capsule  2 capsule Oral Daily Garlan Fillers, MD      . DISCONTD: predniSONE (DELTASONE) tablet 10 mg  10 mg Oral Daily Garlan Fillers, MD   10 mg at 12/05/10 1443  . DISCONTD: vitamin E capsule 600 Units  600 Units Oral Daily Garlan Fillers, MD        Allergies as of 12/04/2010 - Review Complete 12/04/2010  Allergen Reaction Noted  . Codeine Other (See Comments)     Family History  Problem Relation Age of Onset  . Dementia Mother   . Hypertension Father     History   Social History  . Marital Status: Legally Separated    Spouse Name:  N/A    Number of Children: 5  . Years of Education: N/A   Occupational History  .  retired     Social History Main Topics  . Smoking status: Never Smoker   . Smokeless tobacco: Never Used  . Alcohol Use: No  . Drug Use: No  . Sexually Active: No   Other Topics Concern  . Not on file   Social History Narrative  . No narrative on file    Review of Systems: All systems reviewed and negative except where noted in HPI.  Physical Exam: Vital signs in last 24 hours: Temp:  [97.5 F (36.4 C)-98.3 F (36.8 C)] 98.3 F (36.8 C) (11/23 0800) Pulse Rate:  [81-98] 93  (11/23 0948) Resp:  [16-25] 23  (11/23 0600) BP: (108-141)/(52-106) 119/64 mmHg (11/23 0948) SpO2:  [97 %-100 %] 100 % (11/23 0600) Weight:  [83.4 kg (183 lb 13.8 oz)] 183 lb 13.8 oz (83.4 kg) (11/22 2302) Last BM Date: 12/05/10 (pt reports blood in stool before admission- no BM since 1800) General:   Pleasant obese black female in NAD Head:  Normocephalic and atraumatic. Eyes:   No icterus.   Conjunctiva pale Ears:  Normal auditory acuity. Neck:  Supple; no masses felt Lungs:  Respirations even and unlabored. Lungs clear to auscultation bilaterlly.   No wheezes, crackles, or rhonchi.  Heart:  Regular rate and rhythm; no murmurs heard. Abdomen:  Soft, nondistended, nontender. Normal bowel sounds. No abdominal guarding.No masses, hepatomegaly or obvious hernias noted.  Rectal:  No external lesions. Small amount of soft reddish stool in vault. Msk:  Symmetrical without gross deformities.  Extremities:  Without edema. Neurologic:  Alert and  oriented x4;  grossly normal neurologically. Skin:  Intact without significant lesions or rashes. Cervical Nodes:  No significant cervical adenopathy. Psych:  Alert and cooperative. Normal mood and affect.  Lab Results:  Basename 12/06/10 0315 12/05/10 0605 12/04/10 2137  WBC 8.7 9.9 9.2  HGB 7.9* 9.1* 9.5*  HCT 23.1* 27.8* 29.8*  PLT 255 315 296   BMET  Basename  12/06/10 0315 12/05/10 0605 12/04/10 2137  NA 139 138 137  K 4.5 4.0 5.0  CL 106 101 99  CO2 23 24 26   GLUCOSE 78 84 107*  BUN 25* 33* 28*  CREATININE 0.67 0.72 0.64  CALCIUM 9.3 9.8 10.0   LFT  Basename 12/06/10 0315  PROT 5.8*  ALBUMIN 2.9*  AST 23  ALT 20  ALKPHOS 51  BILITOT 0.2*  BILIDIR --  IBILI --   PT/INR  Basename 12/04/10 2137  LABPROT 13.0  INR 0.96     Previous Endoscopies: 1. EGD March 2011 for anemia, heme + stools - Abnormal mucosa in the antrum - gastritis vs. gastric antral  vascular ectasia. Biopsies c/w erosive gastritis. 2) Submucosal nodule in the body of the stomach - unchanged from  prior descriptions. 3) Erosions, multiple in the second portion duodenum (a few and  superficial - tiny)  4) Polyps - a few subcentimeter in proximal stomach  2. EUS Aug 2010 for gastric subepithelial lesion -9.50mm by 9.4mm subepithelial lesion in distal gastric body that is asymptomatic and has not grown since first EUS evaluation over 3 years ago. 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  3. Colonoscopy March 2010 for history of pre-cancerous (adenomatous) colon polyps ((12/2005 3 polyps removed, all adenomas, one with villous component). FINDINGS: A total of 5 colon polyps were found and removed. Two 3-37mm, sessile polyps were removed from cecum by cold snare technique. One was retreived and  sent to pathology (jar 1). Two 5-26mm sessile polyps were found in transverse colon, both were removed with snare/cautery method, one was retrieved and sent to pathology (jar 2). A final 5-71mm polyp was found and removed from sigmoid colon using snare/cautery method. This was not retrieved (see image1, image5, and image8). This was otherwise a normal examination  Cecal polyps adenomatous, transverse polyp hyperplastic.    Impression / Plan: 1.  Painless hematochezia. Etiology?  Diverticular bleed seems most likely  though no mention of diverticulosis on colonoscopy in 2010. Ischemic doesn't seem likely with absence of abdominal pain, normal WBC. Though BUN slightly elevated, suspicion for rapid transit upper GIB is low. Bleeding has resolved.  2. Anemia of acute blood loss. Hemoglobin 12.4 in March 2012. It was 9.5 upon this admission, now down to 7.9. Hopefully blood equilibrating as she hasn't had any active bleeding since admission. Patient for transfer to medical bed. Hopefully home in next day or two. Despite significant blood loss, hemoglobin of 7.9 she isn't symptomatic.  3. 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  4. Polymyositis, on chronic Prednisone.   LOS: 2 days   Willette Cluster  12/06/2010, 10:41 AM

## 2010-12-07 DIAGNOSIS — K922 Gastrointestinal hemorrhage, unspecified: Secondary | ICD-10-CM

## 2010-12-07 DIAGNOSIS — D62 Acute posthemorrhagic anemia: Secondary | ICD-10-CM

## 2010-12-07 LAB — COMPREHENSIVE METABOLIC PANEL
ALT: 21 U/L (ref 0–35)
AST: 24 U/L (ref 0–37)
Albumin: 2.9 g/dL — ABNORMAL LOW (ref 3.5–5.2)
Alkaline Phosphatase: 50 U/L (ref 39–117)
Chloride: 108 mEq/L (ref 96–112)
Potassium: 4.1 mEq/L (ref 3.5–5.1)
Total Bilirubin: 0.2 mg/dL — ABNORMAL LOW (ref 0.3–1.2)

## 2010-12-07 LAB — IRON AND TIBC
Iron: 23 ug/dL — ABNORMAL LOW (ref 42–135)
TIBC: 294 ug/dL (ref 250–470)
UIBC: 271 ug/dL (ref 125–400)

## 2010-12-07 LAB — CBC
Platelets: 279 10*3/uL (ref 150–400)
RDW: 17.6 % — ABNORMAL HIGH (ref 11.5–15.5)
WBC: 7.8 10*3/uL (ref 4.0–10.5)

## 2010-12-07 LAB — FERRITIN: Ferritin: 22 ng/mL (ref 10–291)

## 2010-12-07 LAB — RETICULOCYTES
RBC.: 2.71 MIL/uL — ABNORMAL LOW (ref 3.87–5.11)
Retic Count, Absolute: 97.6 10*3/uL (ref 19.0–186.0)

## 2010-12-07 LAB — ABO/RH: ABO/RH(D): O POS

## 2010-12-07 MED ORDER — SODIUM CHLORIDE 0.9 % IV SOLN
25.0000 mg | Freq: Once | INTRAVENOUS | Status: AC
  Administered 2010-12-07: 25 mg via INTRAVENOUS
  Filled 2010-12-07: qty 0.5

## 2010-12-07 MED ORDER — PREDNISONE 20 MG PO TABS
20.0000 mg | ORAL_TABLET | Freq: Every day | ORAL | Status: DC
  Administered 2010-12-08 – 2010-12-09 (×2): 20 mg via ORAL
  Filled 2010-12-07 (×2): qty 1

## 2010-12-07 MED ORDER — SODIUM CHLORIDE 0.9 % IV SOLN
500.0000 mg | Freq: Every day | INTRAVENOUS | Status: AC
  Administered 2010-12-07 – 2010-12-08 (×2): 500 mg via INTRAVENOUS
  Filled 2010-12-07 (×2): qty 10

## 2010-12-07 NOTE — Progress Notes (Signed)
Addenndum to my earlier note.today. We will go ahead and  proceed with EGD tomorrow am, because there is a high probability of that being an UGI bleed.

## 2010-12-07 NOTE — Progress Notes (Signed)
Subjective No complaints, hungry  Objective: Vital signs in last 24 hours: Temp:  [98.3 F (36.8 C)-98.6 F (37 C)] 98.6 F (37 C) (11/24 0603) Pulse Rate:  [88-93] 89  (11/24 0603) Resp:  [18] 18  (11/24 0603) BP: (110-130)/(63-75) 110/75 mmHg (11/24 0603) SpO2:  [96 %-100 %] 96 % (11/24 0603) Last BM Date: 12/05/10 General:   Alert,  pleasant, cooperative in NAD Head:  Normocephalic and atraumatic. Eyes:  Sclera clear, no icterus.   Conjunctiva pink. Mouth:  No deformity or lesions, dentition normal. Neck:  Supple; no masses or thyromegaly. Heart:  Regular rate and rhythm; no murmurs, clicks, rubs,  or gallops. Lungs:  No wheezes or rales Abdomen:  Soft, nontender, not distended, active bowl sounds  Msk:  Symmetrical without gross deformities. Normal posture. Pulses:  Normal pulses noted. Extremities:  Without clubbing or edema. Neurologic:  Alert and  oriented x4;  grossly normal neurologically. Skin:  Intact without significant lesions or rashes.  Intake/Output from previous day: 11/23 0701 - 11/24 0700 In: 3193.5 [P.O.:1320; I.V.:1873.5] Out: 650 [Urine:650] Intake/Output this shift:    Lab Results:  Basename 12/07/10 0525 12/06/10 2049 12/06/10 0315 12/05/10 0605  WBC 7.8 -- 8.7 9.9  HGB 7.2* 7.4* 7.9* --  HCT 22.5* 23.1* 23.1* --  PLT 279 -- 255 315   BMET  Basename 12/07/10 0525 12/06/10 0315 12/05/10 0605  NA 139 139 138  K 4.1 4.5 4.0  CL 108 106 101  CO2 20 23 24   GLUCOSE 145* 78 84  BUN 14 25* 33*  CREATININE 0.65 0.67 0.72  CALCIUM 8.5 9.3 9.8   LFT  Basename 12/07/10 0525  PROT 5.8*  ALBUMIN 2.9*  AST 24  ALT 21  ALKPHOS 50  BILITOT 0.2*  BILIDIR --  IBILI --   PT/INR  Basename 12/04/10 2137  LABPROT 13.0  INR 0.96   Hepatitis Panel No results found for this basename: HEPBSAG,HCVAB,HEPAIGM,HEPBIGM in the last 72 hours  Studies/Results: No results found.   ASSESSMENT:   Principal Problem:  *Hematochezia Active Problems:  Polymyositis  Adrenal insufficiency  Acute blood loss anemia     PLAN:   Hemodynamically stable after a significant GIB, suspect diverticular bleed although her BUN is down to 14 today from 25. Would advance diet, and ??transfuse as per DR Clelia Croft. Consider EGD if more bleeding. She is up to date on her colonoscopy     LOS: 3 days   Lina Sar  12/07/2010, 7:09 AM

## 2010-12-07 NOTE — Progress Notes (Signed)
Subjective: Feels fine.  Denies abdominal pain.  1 normal BM yesterday.    Objective: Vital signs in last 24 hours: Temp:  [98.5 F (36.9 C)-98.6 F (37 C)] 98.6 F (37 C) (11/24 0603) Pulse Rate:  [88-89] 89  (11/24 0603) Resp:  [18] 18  (11/24 0603) BP: (110-116)/(63-75) 110/75 mmHg (11/24 0603) SpO2:  [96 %] 96 % (11/24 0603) Weight change:  Last BM Date: 12/05/10  CBG (last 3)  No results found for this basename: GLUCAP:3 in the last 72 hours  Intake/Output from previous day: 11/23 0701 - 11/24 0700 In: 3193.5 [P.O.:1320; I.V.:1873.5] Out: 650 [Urine:650] Intake/Output this shift: Total I/O In: 360 [P.O.:360] Out: - 1 BM recorded  General appearance: alert and no distress Eyes: no scleral icterus Throat: oropharynx moist without erythema Resp: clear to auscultation bilaterally Cardio: regular rate and rhythm, S1, S2 normal, no murmur, click, rub or gallop GI: soft, non-tender; bowel sounds normal; no masses,  no organomegaly Extremities: no clubbing, cyanosis or edema Neuro: proximal muscle weakness   Lab Results:  [facility] 12/07/10 0525 12/06/10 0315  NA 139 139  K 4.1 4.5  CL 108 106  CO2 20 23  GLUCOSE 145* 78  BUN 14 25*  CREATININE 0.65 0.67  CALCIUM 8.5 9.3  MG -- --  PHOS -- --    Basename 12/07/10 0525 12/06/10 0315  AST 24 23  ALT 21 20  ALKPHOS 50 51  BILITOT 0.2* 0.2*  PROT 5.8* 5.8*  ALBUMIN 2.9* 2.9*    Basename 12/07/10 0525 12/06/10 2049 12/06/10 0315 12/04/10 2137  WBC 7.8 -- 8.7 --  NEUTROABS -- -- -- 6.2  HGB 7.2* 7.4* -- --  HCT 22.5* 23.1* -- --  MCV 83.0 -- 81.6 --  PLT 279 -- 255 --   Lab Results  Component Value Date   INR 0.96 12/04/2010   No results found for this basename: CKTOTAL:3,CKMB:3,CKMBINDEX:3,TROPONINI:3 in the last 72 hours No results found for this basename: TSH,T4TOTAL,FREET3,T3FREE,THYROIDAB in the last 72 hours  Basename 12/07/10 0525  VITAMINB12 --  FOLATE --  FERRITIN --  TIBC --  IRON --    RETICCTPCT 3.6*    Studies/Results: No results found.   Medications: Scheduled:   . acetaminophen  650 mg Oral Once  . ALPRAZolam  0.25 mg Oral BID  . atenolol  50 mg Oral Daily  . diphenhydrAMINE  25 mg Oral Once  . ergocalciferol  50,000 Units Oral 2 times weekly  . folic acid  1 mg Oral Daily  . niacin  500 mg Oral QHS  . pantoprazole  40 mg Oral Daily  . potassium chloride SA  40 mEq Oral Daily  . predniSONE  60 mg Oral QAC breakfast  . vitamin A & D      . vitamin B-12  1,000 mcg Oral Daily  . vitamin E  400 Units Oral Daily   Continuous:   . 0.9 % NaCl with KCl 20 mEq / L 75 mL/hr at 12/07/10 0128    Assessment/Plan: Principal Problem: 1. *Hematochezia- resolved though Hg continues to trend downwards.  Anticipate EGD tomorrow per GI with consideration of capsule endoscopy if unrevealing.  Appreciate GI guidance. Active Problems: 2. Polymyositis- continue prednisone. 3. Adrenal insufficiency- decrease stress-dosed steroids to 20mg  daily tomorrow.  May be contributing to insomnia. 4. Acute blood loss anemia- she declines transfusion at this time.  She is not symptomatic and BP/HR are stable.  Will give IV Iron infusion per pharmacy to help rebuild  RBCs.  Advised transfusion if any lower tomorrow.   5. Dispo- possible discharge on Monday if Hg stable and EGD OK.    LOS: 3 days   Rosslyn Pasion,W DOUGLAS 12/07/2010, 11:57 AM

## 2010-12-07 NOTE — Progress Notes (Signed)
Pt refused blood transfusion at this time. Pt wants to wait until morning labs and talk with the doctor. MD notified. [name] [name]

## 2010-12-08 ENCOUNTER — Encounter (HOSPITAL_COMMUNITY)
Admission: EM | Disposition: A | Discharge status: Home / Self Care | Payer: Self-pay | Source: Home / Self Care | Attending: Internal Medicine

## 2010-12-08 ENCOUNTER — Encounter (HOSPITAL_COMMUNITY): Payer: Self-pay | Admitting: *Deleted

## 2010-12-08 DIAGNOSIS — D62 Acute posthemorrhagic anemia: Secondary | ICD-10-CM

## 2010-12-08 DIAGNOSIS — K922 Gastrointestinal hemorrhage, unspecified: Secondary | ICD-10-CM

## 2010-12-08 HISTORY — PX: ESOPHAGOGASTRODUODENOSCOPY: SHX5428

## 2010-12-08 LAB — CBC
Hemoglobin: 6.9 g/dL — CL (ref 12.0–15.0)
MCHC: 31.4 g/dL (ref 30.0–36.0)
Platelets: 295 10*3/uL (ref 150–400)
RBC: 2.63 MIL/uL — ABNORMAL LOW (ref 3.87–5.11)

## 2010-12-08 LAB — COMPREHENSIVE METABOLIC PANEL
ALT: 25 U/L (ref 0–35)
AST: 30 U/L (ref 0–37)
Alkaline Phosphatase: 51 U/L (ref 39–117)
Calcium: 9.3 mg/dL (ref 8.4–10.5)
GFR calc Af Amer: >90 mL/min (ref >90)
Glucose, Bld: 85 mg/dL (ref 70–99)
Potassium: 3.8 mEq/L (ref 3.5–5.1)
Sodium: 142 mEq/L (ref 135–145)
Total Protein: 5.8 g/dL — ABNORMAL LOW (ref 6.0–8.3)

## 2010-12-08 LAB — MRSA CULTURE

## 2010-12-08 SURGERY — EGD (ESOPHAGOGASTRODUODENOSCOPY)
Anesthesia: Moderate Sedation

## 2010-12-08 MED ORDER — FENTANYL NICU IV SYRINGE 50 MCG/ML
INJECTION | INTRAMUSCULAR | Status: DC | PRN
  Administered 2010-12-08: 25 ug via INTRAVENOUS

## 2010-12-08 MED ORDER — BUTAMBEN-TETRACAINE-BENZOCAINE 2-2-14 % EX AERO
INHALATION_SPRAY | CUTANEOUS | Status: DC | PRN
  Administered 2010-12-08 (×2): 1 via TOPICAL

## 2010-12-08 MED ORDER — MIDAZOLAM HCL 10 MG/2ML IJ SOLN
INTRAMUSCULAR | Status: DC | PRN
  Administered 2010-12-08: 1 mg via INTRAVENOUS

## 2010-12-08 MED ORDER — MIDAZOLAM HCL 10 MG/2ML IJ SOLN
INTRAMUSCULAR | Status: AC
  Filled 2010-12-08: qty 2

## 2010-12-08 MED ORDER — FENTANYL CITRATE 0.05 MG/ML IJ SOLN
INTRAMUSCULAR | Status: AC
  Filled 2010-12-08: qty 2

## 2010-12-08 NOTE — Progress Notes (Signed)
CRITICAL VALUE ALERT  Critical value received: hgb 6.9  Date of notification:  12-08-2010  Time of notification:  0548  Critical value read back:yes  Nurse who received alert:  Corene Cornea RN  MD notified (1st page):  Dr Clelia Croft  Paged by Pernell Dupre RN for critical  Time of first page:  0603 MD notified (2nd page):  Time of second page:  Responding MD:  Clelia Croft  Time MD responded:  [phone]

## 2010-12-08 NOTE — Interval H&P Note (Signed)
History and Physical Interval Note:   12/08/2010   8:47 AM   [name] [name]  has presented today for surgery, with the diagnosis of GI bleed  The various methods of treatment have been discussed with the patient and family. After consideration of risks, benefits and other options for treatment, the patient has consented to  Procedure(s): ESOPHAGOGASTRODUODENOSCOPY (EGD) as a surgical intervention .  The patients' history has been reviewed, patient examined, no change in status, stable for surgery.  I have reviewed the patients' chart and labs.  Questions were answered to the patient's satisfaction.     Lina Sar  MD

## 2010-12-08 NOTE — Progress Notes (Signed)
Subjective: Had EGD this am which was normal.  Normal BM yesterday.  She is now willing to have blood transfusion.  Denies weakness or lightheadedness.    Objective: Vital signs in last 24 hours: Temp:  [97.7 F (36.5 C)-98.2 F (36.8 C)] 97.7 F (36.5 C) (11/25 0824) Pulse Rate:  [79-95] 82  (11/25 0824) Resp:  [16-24] 21  (11/25 0900) BP: (132-145)/(68-93) 137/68 mmHg (11/25 0900) SpO2:  [96 %-100 %] 100 % (11/25 0900) Weight change:  Last BM Date: 12/07/10  CBG (last 3)  No results found for this basename: GLUCAP:3 in the last 72 hours  Intake/Output from previous day: 11/24 0701 - 11/25 0700 In: 1803.8 [P.O.:840; I.V.:963.8] Out: 1050 [Urine:1050] Intake/Output this shift:    General appearance: alert and no distress Eyes: no scleral icterus Throat: oropharynx moist without erythema Resp: clear to auscultation bilaterally Cardio: regular rate and rhythm, S1, S2 normal, no murmur, click, rub or gallop GI: soft, non-tender; bowel sounds normal; no masses,  no organomegaly Extremities: no clubbing, cyanosis or edema   Lab Results:  [facility] 12/08/10 0515 12/07/10 0525  NA 142 139  K 3.8 4.1  CL 110 108  CO2 23 20  GLUCOSE 85 145*  BUN 14 14  CREATININE 0.77 0.65  CALCIUM 9.3 8.5  MG -- --  PHOS -- --    Basename 12/08/10 0515 12/07/10 0525  AST 30 24  ALT 25 21  ALKPHOS 51 50  BILITOT 0.3 0.2*  PROT 5.8* 5.8*  ALBUMIN 2.9* 2.9*    Basename 12/08/10 0515 12/07/10 0525  WBC 9.4 7.8  NEUTROABS -- --  HGB 6.9* 7.2*  HCT 22.0* 22.5*  MCV 83.7 83.0  PLT 295 279   Lab Results  Component Value Date   INR 0.96 12/04/2010   No results found for this basename: CKTOTAL:3,CKMB:3,CKMBINDEX:3,TROPONINI:3 in the last 72 hours No results found for this basename: TSH,T4TOTAL,FREET3,T3FREE,THYROIDAB in the last 72 hours  Basename 12/07/10 0525  VITAMINB12 >2000*  FOLATE >20.0  FERRITIN 22  TIBC 294  IRON 23*  RETICCTPCT 3.6*    Studies/Results: No  results found.   Medications: Scheduled:   . acetaminophen  650 mg Oral Once  . ALPRAZolam  0.25 mg Oral BID  . atenolol  50 mg Oral Daily  . diphenhydrAMINE  25 mg Oral Once  . ergocalciferol  50,000 Units Oral 2 times weekly  . folic acid  1 mg Oral Daily  . iron dextran (INFED/DEXFERRUM) IVPB (TEST DOSE)  25 mg Intravenous Once  . iron dextran (INFED/DEXFERRRUM) 500 MG IVPB  500 mg Intravenous Daily  . niacin  500 mg Oral QHS  . pantoprazole  40 mg Oral Daily  . potassium chloride SA  40 mEq Oral Daily  . predniSONE  20 mg Oral QAC breakfast  . vitamin B-12  1,000 mcg Oral Daily  . vitamin E  400 Units Oral Daily   Continuous:   . 0.9 % NaCl with KCl 20 mEq / L 20 mL/hr at 12/07/10 1434    Assessment/Plan: Principal Problem:  1. *Hematochezia- no further bleeding though Hg continues to trend downwards. EGD shows no source of Upper GI bleed.  Consider capsule endoscopy as outpatient if blood counts continue to drop despite IV iron and transfusion.    Active Problems:  2. Acute blood loss anemia- will transfuse 2 units PRBC and recheck CBC in am.  Receiving 2nd dose of IV iron currently. 3. Polymyositis- continue prednisone.  4. Adrenal insufficiency- decrease stress-dosed steroids  to 20mg  daily. 5. Dispo- possible discharge on Monday if Hg stable after transfusion.   LOS: 4 days   Kiaira Pointer,W DOUGLAS 12/08/2010, 12:40 PM

## 2010-12-08 NOTE — Op Note (Signed)
Please see EGD report under Procedures

## 2010-12-08 NOTE — Brief Op Note (Signed)
EGD results: Normal esophagus, stomach, duodenum. Suspect bleeding was of lower GI origin

## 2010-12-08 NOTE — Progress Notes (Signed)
Patients hemoglobin 6.9 with hct 22.  Hemoglobin 7.2 11/24.  Spoke with patient concerning continued drop in hemoglobin and potential need for blood.  Patient states, she would like to speak with the Dr concerning the cause for the need for blood.  Patient expresses to have the test done prior to deciding on blood or not.  Patient has refused blood, and states, she would like to wait until last resort.  Patient verbalizes understanding that she may end up having to need blood in order to progress.  Patient says she is not saying she won't take the blood, just would like to wait and talk with Dr before deciding.  Notified Dr Clelia Croft of the above.  Dr Juanda Chance scheduled to perform a EGD this am to evaluate.  Dr Clelia Croft would recommend pt to have blood.  Dr Clelia Croft will be to evaluate and speak with pt later this am.  If Dr Juanda Chance not in to evaluate and speak with patient by 0900, then day rn to call Dr Clelia Croft to evaluate action.  Order left and patient verbalized understanding.  Instructed pt on risk of low hgb and benefits of blood.  Patient will wait and speak to MD prior to decision.

## 2010-12-08 NOTE — Progress Notes (Signed)
H/H continues to drop but  here is no overt GIB. Will need a blood  transfusion.

## 2010-12-09 ENCOUNTER — Encounter (HOSPITAL_COMMUNITY): Payer: Self-pay

## 2010-12-09 LAB — CBC
HCT: 28.9 % — ABNORMAL LOW (ref 36.0–46.0)
Hemoglobin: 9.4 g/dL — ABNORMAL LOW (ref 12.0–15.0)
MCH: 27.6 pg (ref 26.0–34.0)
MCHC: 32.5 g/dL (ref 30.0–36.0)
MCV: 85 fL (ref 78.0–100.0)
RBC: 3.4 MIL/uL — ABNORMAL LOW (ref 3.87–5.11)

## 2010-12-09 MED ORDER — PREDNISONE 10 MG PO TABS: 10.0000 mg | ORAL_TABLET | Freq: Every day | ORAL | Status: DC

## 2010-12-09 NOTE — Discharge Summary (Signed)
DISCHARGE SUMMARY  [name] [name]  MR#: [id number]  DOB:1940/06/24  Date of Admission: 12/04/2010 Date of Discharge: 12/09/2010  Attending Physician:[name] [name],W [name] [name]  Patient's JYN:[name] Riley Lam, MD, MD  Consults:Treatment Team:  [name] [name], MDGI  Discharge Diagnoses: Principal Problem:  *Hematochezia Active Problems:  Polymyositis  Adrenal insufficiency  Acute blood loss anemia  Past Medical History  Diagnosis Date  . Benign neoplasm of colon   . Esophageal reflux   . Polymyositis   . Allergic rhinitis, cause unspecified   . Unspecified essential hypertension   . Hyperlipidemia   . Osteoporosis   . Vitamin D deficiency    Past Surgical History  Procedure Date  . Lumbar laminectomy   . Pah   . Tonsillectomy   . Bilateral  rotator cuff surgery      Discharge Medications: Current Discharge Medication List    CONTINUE these medications which have CHANGED   Details  predniSONE (DELTASONE) 10 MG tablet Take 1 tablet (10 mg total) by mouth daily. Take 2 pills per day for 2 days, then resume 1 pill daily      CONTINUE these medications which have NOT CHANGED   Details  ALPRAZolam (XANAX) 0.25 MG tablet Take 0.25 mg by mouth 2 (two) times daily. Anxiety and sleep    Ascorbic Acid (VITAMIN C) 1000 MG tablet Take 1,000 mg by mouth daily.      atenolol (TENORMIN) 50 MG tablet Take 50 mg by mouth daily.      Calcium Carbonate (CALTRATE 600) 1500 MG TABS Take 2 tablets by mouth daily. Daily     ergocalciferol (VITAMIN D2) 50000 UNITS capsule Take 50,000 Units by mouth 2 (two) times a week. Takes 1 tablet every Monday and friday    esomeprazole (NEXIUM) 40 MG capsule Take 40 mg by mouth daily before breakfast.      fish oil-omega-3 fatty acids 1000 MG capsule Take 1 g by mouth daily.      folic acid (FOLVITE) 400 MCG tablet Take 400 mcg by mouth daily.      furosemide (LASIX) 40 MG tablet Take 40 mg by mouth daily.      Glucosamine-Chondroit-Vit C-Mn  (GLUCOSAMINE CHONDR 1500 COMPLX) CAPS Take 2 capsules by mouth daily. Daily     niacin (NIASPAN) 500 MG CR tablet Take 500 mg by mouth at bedtime.      potassium chloride SA (K-DUR,KLOR-CON) 20 MEQ tablet Take 40 mEq by mouth daily.     vitamin B-12 (CYANOCOBALAMIN) 1000 MCG tablet Take 1,000 mcg by mouth daily.      vitamin E 600 UNIT capsule Take 600 Units by mouth daily.      NON FORMULARY Blue manna  400 mg daily         Hospital Procedures: EGD (12/08/10) results: Normal esophagus, stomach, duodenum. Suspect bleeding was of lower GI origin IV InFed infusion X 2 (11/24, 11/25) Transfusion 2 units PRBCs (11/25)   History of Present Illness: The patient is a 70 year old woman with several medical problems who was in her usual state of somewhat compromised health until about 3 AM on the day prior to admission when she began to have moderate volume rectal bleeding with bright red blood. She had 3 more episodes with the last episode around 1800 hrs. With these episodes she did not have dizziness, shortness of breath, or palpitations. She has a history of any upper GI bleed in March 2011 with endoscopy showing a submucosal lesion of uncertain etiology. Her last colonoscopy was done  in March 2012 and revealed adenomatous polyps. Her medical history is most significant for polymyositis that requires her to use prednisone 10 mg daily, hyperlipidemia, hypertension, gastroesophageal reflux disease, osteoporosis, and vitamin D deficiency. 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 She has never had problems with a lower GI bleed. She is followed closely by her GI physician.  On presentation to the emergency department her blood pressure was 108/65, pulse 76 am oxygen saturation 99% abdominal exam was benign. Rectal exam performed by the emergency department showed bright red blood on the examining  finger. Hemoglobin was 9.5. She was admitted for further management.   Hospital Course: As [name] [name] was admitted to a step down bed. She was monitored closely for continued bleeding. She had no further evidence of GI bleeding after admission. However, her hemoglobin continued to decline to a nadir of 6.9 initially, the patient was very reluctant to receive a blood transfusion as her brother contracted HIV from a blood transfusion years ago. She was given IV iron on 11/24 and 11/25 in an effort to boost her blood cells. Given the persistent drop in her hemoglobin she agreed to a transfusion on 11/25.  Her posttransfusion hemoglobin is up to 9.4 which is the expected improvement she was seen by gastroenterology and followed throughout her hospital course. Given that she had a recent colonoscopy and had had a prior upper GI bleed, GI recommended an upper endoscopy which was performed on 11/25. This did not show any acute findings.  It appears that this is likely a lower GI bleeding source. At this point, the patient is hemodynamically stable with stable blood counts following her transfusion and is felt stable for discharge home.  Of note, the patient was placed on stress dose steroids throughout his hospitalization given her adrenal insufficiency with chronic steroid use for polymyositis. She tolerated this well and her doses been tapered back to 20 mg per day. She will continue a taper down to her home dose of 10 mg per day.  Day of Discharge Exam BP 155/89  Pulse 81  Temp(Src) 98.1 F (36.7 C) (Oral)  Resp 18  Ht 5\' 3"  (1.6 m)  Wt 83.4 kg (183 lb 13.8 oz)  BMI 32.57 kg/m2  SpO2 98%  Physical Exam: General appearance: alert and no distress Eyes: no scleral icterus Throat: oropharynx moist without erythema Resp: clear to auscultation bilaterally 4  Cardio: regular rate and rhythm GI: soft, non-tender; bowel sounds normal; no masses,  no organomegaly Extremities: no clubbing, cyanosis or  edema Neurologic: Chronic proximal muscle weakness of the upper and lower extremities  Discharge Labs:  [facility] 12/08/10 0515 12/07/10 0525  NA 142 139  K 3.8 4.1  CL 110 108  CO2 23 20  GLUCOSE 85 145*  BUN 14 14  CREATININE 0.77 0.65  CALCIUM 9.3 8.5  MG -- --  PHOS -- --    Basename 12/08/10 0515 12/07/10 0525  AST 30 24  ALT 25 21  ALKPHOS 51 50  BILITOT 0.3 0.2*  PROT 5.8* 5.8*  ALBUMIN 2.9* 2.9*    Basename 12/09/10 0438 12/08/10 0515  WBC 11.4* 9.4  NEUTROABS -- --  HGB 9.4* 6.9*  HCT 28.9* 22.0*  MCV 85.0 83.7  PLT 297 295   Lab Results  Component Value Date   INR 0.96 12/04/2010    Basename 12/07/10 0525  VITAMINB12 >2000*  FOLATE >20.0  FERRITIN 22  TIBC 294  IRON 23*  RETICCTPCT 3.6*    Discharge instructions: Discharge Orders    Future Orders Please Complete By Expires   Diet - low sodium heart healthy      Increase activity slowly      Discharge instructions      Comments:   Call if bleeding returns or if you develop abdominal pain, lightheadedness or increased weakness.  Return to lab at [facility] on Thursday 9-11:30 to recheck blood counts.      Disposition:  To home Follow-up Appts: Follow-up with Dr. Clelia Croft at [facility] as scheduled.  Condition on Discharge: improved, stable Tests Needing Follow-up: CBC  Time with discharge activity: 35 minutes  Signed: Jordyn Doane,W [name] [name] 12/09/2010, 9:18 AM

## 2010-12-10 ENCOUNTER — Encounter (HOSPITAL_COMMUNITY): Payer: Self-pay | Admitting: Internal Medicine

## 2010-12-12 LAB — TYPE AND SCREEN
Antibody Screen: NEGATIVE
Unit division: 0

## 2011-01-07 ENCOUNTER — Encounter (HOSPITAL_COMMUNITY): Payer: Self-pay | Admitting: *Deleted

## 2011-01-07 ENCOUNTER — Inpatient Hospital Stay (HOSPITAL_COMMUNITY): Payer: Medicare Other

## 2011-01-07 ENCOUNTER — Inpatient Hospital Stay (HOSPITAL_COMMUNITY)
Admission: EM | Admit: 2011-01-07 | Discharge: 2011-01-10 | DRG: 394 | Disposition: A | Discharge status: Home / Self Care | Payer: Medicare Other | Attending: Internal Medicine | Admitting: Internal Medicine

## 2011-01-07 DIAGNOSIS — K922 Gastrointestinal hemorrhage, unspecified: Secondary | ICD-10-CM

## 2011-01-07 DIAGNOSIS — E785 Hyperlipidemia, unspecified: Secondary | ICD-10-CM | POA: Diagnosis present

## 2011-01-07 DIAGNOSIS — D62 Acute posthemorrhagic anemia: Secondary | ICD-10-CM | POA: Diagnosis present

## 2011-01-07 DIAGNOSIS — K921 Melena: Secondary | ICD-10-CM

## 2011-01-07 DIAGNOSIS — F411 Generalized anxiety disorder: Secondary | ICD-10-CM | POA: Diagnosis present

## 2011-01-07 DIAGNOSIS — K5730 Diverticulosis of large intestine without perforation or abscess without bleeding: Secondary | ICD-10-CM | POA: Diagnosis present

## 2011-01-07 DIAGNOSIS — K219 Gastro-esophageal reflux disease without esophagitis: Secondary | ICD-10-CM | POA: Diagnosis present

## 2011-01-07 DIAGNOSIS — D371 Neoplasm of uncertain behavior of stomach: Secondary | ICD-10-CM | POA: Diagnosis present

## 2011-01-07 DIAGNOSIS — Z860100 Personal history of colon polyps, unspecified: Secondary | ICD-10-CM

## 2011-01-07 DIAGNOSIS — I10 Essential (primary) hypertension: Secondary | ICD-10-CM | POA: Diagnosis present

## 2011-01-07 DIAGNOSIS — M4800 Spinal stenosis, site unspecified: Secondary | ICD-10-CM | POA: Diagnosis present

## 2011-01-07 DIAGNOSIS — R7309 Other abnormal glucose: Secondary | ICD-10-CM | POA: Diagnosis present

## 2011-01-07 DIAGNOSIS — E559 Vitamin D deficiency, unspecified: Secondary | ICD-10-CM | POA: Diagnosis present

## 2011-01-07 DIAGNOSIS — K633 Ulcer of intestine: Principal | ICD-10-CM | POA: Diagnosis present

## 2011-01-07 DIAGNOSIS — M3320 Polymyositis, organ involvement unspecified: Secondary | ICD-10-CM | POA: Diagnosis present

## 2011-01-07 DIAGNOSIS — D378 Neoplasm of uncertain behavior of other specified digestive organs: Secondary | ICD-10-CM | POA: Diagnosis present

## 2011-01-07 DIAGNOSIS — M810 Age-related osteoporosis without current pathological fracture: Secondary | ICD-10-CM | POA: Diagnosis present

## 2011-01-07 LAB — DIFFERENTIAL
Eosinophils Absolute: 0.1 10*3/uL (ref 0.0–0.7)
Eosinophils Relative: 1 % (ref 0–5)
Lymphs Abs: 1.7 10*3/uL (ref 0.7–4.0)
Monocytes Relative: 6 % (ref 3–12)
Neutrophils Relative %: 83 % — ABNORMAL HIGH (ref 43–77)

## 2011-01-07 LAB — HEMOGLOBIN AND HEMATOCRIT, BLOOD: HCT: 30.7 % — ABNORMAL LOW (ref 36.0–46.0)

## 2011-01-07 LAB — COMPREHENSIVE METABOLIC PANEL
Albumin: 3.4 g/dL — ABNORMAL LOW (ref 3.5–5.2)
BUN: 22 mg/dL (ref 6–23)
Chloride: 100 mEq/L (ref 96–112)
GFR calc Af Amer: >90 mL/min (ref >90)
GFR calc non Af Amer: 88 mL/min — ABNORMAL LOW (ref >90)
Potassium: 4 mEq/L (ref 3.5–5.1)
Sodium: 137 mEq/L (ref 135–145)

## 2011-01-07 LAB — RETICULOCYTES
RBC.: 3.89 MIL/uL (ref 3.87–5.11)
Retic Ct Pct: 1.9 % (ref 0.4–3.1)

## 2011-01-07 LAB — CBC
Hemoglobin: 10.8 g/dL — ABNORMAL LOW (ref 12.0–15.0)
MCH: 27.6 pg (ref 26.0–34.0)
MCV: 84.4 fL (ref 78.0–100.0)
RBC: 3.91 MIL/uL (ref 3.87–5.11)

## 2011-01-07 LAB — PROTIME-INR: INR: 0.91 (ref 0.00–1.49)

## 2011-01-07 LAB — SAMPLE TO BLOOD BANK

## 2011-01-07 MED ORDER — ZOLPIDEM TARTRATE 5 MG PO TABS
5.0000 mg | ORAL_TABLET | Freq: Every evening | ORAL | Status: DC | PRN
  Administered 2011-01-08: 5 mg via ORAL
  Filled 2011-01-07: qty 1

## 2011-01-07 MED ORDER — PANTOPRAZOLE SODIUM 40 MG IV SOLR
40.0000 mg | Freq: Two times a day (BID) | INTRAVENOUS | Status: DC
  Administered 2011-01-07 – 2011-01-08 (×2): 40 mg via INTRAVENOUS
  Filled 2011-01-07 (×6): qty 40

## 2011-01-07 MED ORDER — METHYLPREDNISOLONE SODIUM SUCC 40 MG IJ SOLR
40.0000 mg | INTRAMUSCULAR | Status: DC
  Administered 2011-01-07 – 2011-01-08 (×2): 40 mg via INTRAVENOUS
  Filled 2011-01-07 (×3): qty 1

## 2011-01-07 MED ORDER — ONDANSETRON HCL 4 MG/2ML IJ SOLN: 4.0000 mg | Freq: Four times a day (QID) | INTRAMUSCULAR | Status: DC | PRN

## 2011-01-07 MED ORDER — LORAZEPAM 2 MG/ML IJ SOLN
1.0000 mg | Freq: Four times a day (QID) | INTRAMUSCULAR | Status: DC | PRN
  Administered 2011-01-07: 22:00:00 via INTRAVENOUS
  Filled 2011-01-07: qty 1

## 2011-01-07 MED ORDER — ACETAMINOPHEN 650 MG RE SUPP: 650.0000 mg | Freq: Four times a day (QID) | RECTAL | Status: DC | PRN

## 2011-01-07 MED ORDER — TECHNETIUM TC 99M-LABELED RED BLOOD CELLS IV KIT
19.4000 | PACK | Freq: Once | INTRAVENOUS | Status: AC | PRN
  Administered 2011-01-07: 19 via INTRAVENOUS

## 2011-01-07 MED ORDER — ONDANSETRON HCL 4 MG PO TABS: 4.0000 mg | ORAL_TABLET | Freq: Four times a day (QID) | ORAL | Status: DC | PRN

## 2011-01-07 MED ORDER — ZOLPIDEM TARTRATE 10 MG PO TABS: 10.0000 mg | ORAL_TABLET | Freq: Every evening | ORAL | Status: DC | PRN

## 2011-01-07 MED ORDER — SODIUM CHLORIDE 0.9 % IV SOLN
Freq: Once | INTRAVENOUS | Status: AC
  Administered 2011-01-07: 16:00:00 via INTRAVENOUS

## 2011-01-07 MED ORDER — POTASSIUM CHLORIDE IN NACL 20-0.9 MEQ/L-% IV SOLN
INTRAVENOUS | Status: DC
  Administered 2011-01-07 – 2011-01-10 (×4): via INTRAVENOUS
  Filled 2011-01-07 (×8): qty 1000

## 2011-01-07 MED ORDER — ACETAMINOPHEN 325 MG PO TABS
650.0000 mg | ORAL_TABLET | Freq: Four times a day (QID) | ORAL | Status: DC | PRN
  Administered 2011-01-10: 650 mg via ORAL
  Filled 2011-01-07: qty 2

## 2011-01-07 NOTE — ED Notes (Signed)
Pt reports passing blood in stool x1 today. Denies abd pain, weakness. Feels "bad." C/o "queasy" feeling but denies nausea. Hx of gi bleeding last month.

## 2011-01-07 NOTE — Consult Note (Signed)
[facility] Consultation  Referring Provider: Guerry Bruin, MD Primary Care Physician:  Kari Baars, MD, MD Primary Gastroenterologist:  Rob Bunting, MD  Reason for Consultation:  Hematochezia  HPI: [name] [name] is a 70 y.o. female with dark red blood per rectum today. She describes one episode today of 1 cup of blood without a bowel movement. She had a normal bowel movement earlier in the day. She noted a very small amount of dark blood per rectum last night. Recent hospitalization for same problem 1 month ago and EGD was negative during that admission. Last colonoscopy was 03/2008 with polyps found and no diverticulosis noted. Hb today is 10.8 and it was 9.4 at discharge last month. Denies weight loss, abdominal pain, constipation, diarrhea, change in stool caliber, melena,nausea, vomiting, dysphagia, reflux symptoms, chest pain.   Past Medical History  Diagnosis Date  . Benign neoplasm of colon   . Esophageal reflux   . Polymyositis   . Allergic rhinitis, cause unspecified   . Unspecified essential hypertension   . Hyperlipidemia   . Osteoporosis   . Vitamin D deficiency     Past Surgical History  Procedure Date  . Lumbar laminectomy   . Tonsillectomy   . Bilateral  rotator cuff surgery   . Esophagogastroduodenoscopy 12/08/2010    Procedure: ESOPHAGOGASTRODUODENOSCOPY (EGD);  Surgeon: Hart Carwin, MD;  Location: [facility];  Service: Endoscopy;  Laterality: N/A;  . Abdominal hysterectomy     partial    Prior to Admission medications   Medication Sig Start Date End Date Taking? Authorizing Provider  acetaminophen (TYLENOL) 500 MG tablet Take 500 mg by mouth every 6 (six) hours as needed. For generic pain relief    Yes Historical Provider, MD  ALPRAZolam (XANAX) 0.25 MG tablet Take 0.25 mg by mouth 2 (two) times daily. Anxiety and sleep   Yes Historical Provider, MD  Ascorbic Acid (VITAMIN C) 1000 MG tablet Take 1,000 mg by mouth daily.     Yes  Historical Provider, MD  atenolol (TENORMIN) 50 MG tablet Take 50 mg by mouth daily.     Yes Historical Provider, MD  Calcium Carbonate (CALTRATE 600) 1500 MG TABS Take 2 tablets by mouth daily. Daily    Yes Historical Provider, MD  ergocalciferol (VITAMIN D2) 50000 UNITS capsule Take 50,000 Units by mouth 2 (two) times a week. Takes 1 tablet every Monday and friday   Yes Historical Provider, MD  esomeprazole (NEXIUM) 40 MG capsule Take 40 mg by mouth daily before breakfast.     Yes Historical Provider, MD  fish oil-omega-3 fatty acids 1000 MG capsule Take 1 g by mouth daily.     Yes Historical Provider, MD  folic acid (FOLVITE) 400 MCG tablet Take 400 mcg by mouth daily.     Yes Historical Provider, MD  furosemide (LASIX) 40 MG tablet Take 40 mg by mouth daily.     Yes Historical Provider, MD  niacin (NIASPAN) 500 MG CR tablet Take 500 mg by mouth at bedtime.     Yes Historical Provider, MD  potassium chloride SA (K-DUR,KLOR-CON) 20 MEQ tablet Take 40 mEq by mouth daily.    Yes Historical Provider, MD  predniSONE (DELTASONE) 10 MG tablet Take 1 tablet (10 mg total) by mouth daily. Take 2 pills per day for 2 days, then resume 1 pill daily 12/09/10  Yes W Buren Kos, MD  vitamin B-12 (CYANOCOBALAMIN) 1000 MCG tablet Take 1,000 mcg by mouth daily.     Yes Historical Provider, MD  vitamin E 600 UNIT capsule Take 600 Units by mouth daily.     Yes Historical Provider, MD    Current Facility-Administered Medications  Medication Dose Route Frequency Provider Last Rate Last Dose  . 0.9 %  sodium chloride infusion   Intravenous Once Toy Baker, MD 125 mL/hr at 01/07/11 1628    . 0.9 % NaCl with KCl 20 mEq/ L  infusion   Intravenous Continuous Gaspar Garbe, MD      . acetaminophen (TYLENOL) tablet 650 mg  650 mg Oral Q6H PRN Gaspar Garbe, MD       Or  . acetaminophen (TYLENOL) suppository 650 mg  650 mg Rectal Q6H PRN Gaspar Garbe, MD      . LORazepam (ATIVAN) injection 1 mg  1 mg  Intravenous Q6H PRN Gaspar Garbe, MD      . methylPREDNISolone sodium succinate (SOLU-MEDROL) 40 MG injection 40 mg  40 mg Intravenous Q24H Gaspar Garbe, MD      . ondansetron [facility]) tablet 4 mg  4 mg Oral Q6H PRN Gaspar Garbe, MD       Or  . ondansetron [facility]) injection 4 mg  4 mg Intravenous Q6H PRN Gaspar Garbe, MD      . pantoprazole (PROTONIX) injection 40 mg  40 mg Intravenous Q12H Gaspar Garbe, MD      . zolpidem [facility]) tablet 5 mg  5 mg Oral QHS PRN Roney Jaffe, PHARMD      . DISCONTD: zolpidem (AMBIEN) tablet 10 mg  10 mg Oral QHS PRN Gaspar Garbe, MD        Allergies as of 01/07/2011 - Review Complete 01/07/2011  Allergen Reaction Noted  . Codeine Nausea And Vomiting     Family History  Problem Relation Age of Onset  . Dementia Mother   . Hypertension Father   . Malignant hyperthermia Father     History   Social History  . Marital Status: Legally Separated    Spouse Name: N/A    Number of Children: 5  . Years of Education: N/A   Occupational History  . retired     Social History Main Topics  . Smoking status: Never Smoker   . Smokeless tobacco: Current User    Types: Chew  . Alcohol Use: No  . Drug Use: No  . Sexually Active: No   Other Topics Concern  . Not on file   Social History Narrative  . No narrative on file    Review of Systems: Gen: Denies any fever, chills, sweats, anorexia, fatigue, weakness, malaise, weight loss, and sleep disorder CV: Denies chest pain, angina, palpitations, syncope, orthopnea, PND, peripheral edema, and claudication. Resp: Denies dyspnea at rest, dyspnea with exercise, cough, sputum, wheezing, coughing up blood, and pleurisy. GI: Denies vomiting blood, jaundice, and fecal incontinence.   Denies dysphagia or odynophagia. GU : Denies urinary burning, blood in urine, urinary frequency, urinary hesitancy, nocturnal urination, and urinary incontinence. MS: Denies joint pain,  limitation of movement, and swelling, stiffness, low back pain, extremity pain. Denies muscle weakness, cramps, atrophy.  Derm: Denies rash, itching, dry skin, hives, moles, warts, or unhealing ulcers.  Psych: Denies depression, anxiety, memory loss, suicidal ideation, hallucinations, paranoia, and confusion. Heme: Denies bruising, bleeding, and enlarged lymph nodes. Neuro:  Denies any headaches, dizziness, paresthesias. Endo:  Denies any problems with DM, thyroid, adrenal function.  Physical Exam: Vital signs in last 24 hours: Temp:  [98.2 F (36.8 C)-99.2  F (37.3 C)] 98.3 F (36.8 C) (12/25 1709) Pulse Rate:  [78-91] 82  (12/25 1709) Resp:  [18] 18  (12/25 1709) BP: (114-122)/(60-75) 120/75 mmHg (12/25 1709) SpO2:  [98 %-99 %] 99 % (12/25 1709) Weight:  [84.55 kg (186 lb 6.4 oz)] 186 lb 6.4 oz (84.55 kg) (12/25 1709)   General:   Alert, well-developed, well-nourished, pleasant and cooperative in NAD Head:  Normocephalic and atraumatic. Eyes:  Sclera clear, no icterus.   Conjunctiva pink. Ears:  Normal auditory acuity. Nose:  No deformity, discharge,  or lesions. Mouth:  No deformity or lesions.  Oropharynx pink & moist. Neck:  Supple; no masses or thyromegaly. Lungs:  Clear throughout to auscultation.   No wheezes, crackles, or rhonchi. No acute distress. Heart:  Regular rate and rhythm; no murmurs, clicks, rubs,  or gallops. Abdomen:  Soft, nontender and nondistended. No masses, hepatosplenomegaly or hernias noted. Normal bowel sounds, without guarding, and without rebound.   Rectal:  Blood on exam without any lesions per Dr. Wylene Simmer, not repeated Msk:  Symmetrical without gross deformities. Normal posture. Pulses:  Normal pulses noted. Extremities:  Without clubbing or edema. Neurologic:  Alert and  oriented x4;  grossly normal neurologically. Skin:  Intact without significant lesions or rashes. Cervical Nodes:  No significant cervical adenopathy. Psych:  Alert and  cooperative. Normal mood and affect.     Lab Results:  [facility] 01/07/11 1510  WBC 16.3*  HGB 10.8*  HCT 33.0*  PLT 359   BMET  Basename 01/07/11 1555  NA 137  K 4.0  CL 100  CO2 25  GLUCOSE 88  BUN 22  CREATININE 0.63  CALCIUM 9.7   LFT  Basename 01/07/11 1555  PROT 6.9  ALBUMIN 3.4*  AST 34  ALT 28  ALKPHOS 67  BILITOT 0.2*  BILIDIR --  IBILI --   PT/INR  Basename 01/07/11 1510  LABPROT 12.5  INR 0.91    Previous Endoscopies:  See HPI  Impression/Recommendations:  1. Painless hematochezia. A lower GI etiology such as a diverticular or AVM bleed is most likely though no mention of either on last colonoscopy in 2010. Ischemic is not likely with absence of abdominal pain and a normal WBC. Tagged RBC scan ordered today to help localize bleeding site. Pending the findings she probably will need a colonoscopy for further evaluation. 2. Anemia of acute blood loss. Hemoglobin is 10.8 today and was 9.4 one month ago at discharge. 3. Polymyositis, on chronic Prednisone. 4. History of adenomatous colon polyps with villous features. Surveillance colonoscopy was planned for 03/2011.     LOS: 0 days   Graycee Greeson T. Russella Dar MD [facility] 01/07/2011, 7:13 PM

## 2011-01-07 NOTE — ED Provider Notes (Signed)
History     CSN: [id number]  Arrival date & time 01/07/11  1345   First MD Initiated Contact with Patient 01/07/11 1456      Chief Complaint  Patient presents with  . Rectal Bleeding    (Consider location/radiation/quality/duration/timing/severity/associated sxs/prior treatment) HPI Complains of rectal bleeding onset yesterday slight amount and had large amount of bloody stool today 12:40 PM accompanied by lightheadedness no abdominal pain no treatment prior to coming here abdomen feels slightly "jittery" but no pain no treatment prior to coming here. No other associated symptom. Past Medical History  Diagnosis Date  . Benign neoplasm of colon   . Esophageal reflux   . Polymyositis   . Allergic rhinitis, cause unspecified   . Unspecified essential hypertension   . Hyperlipidemia   . Osteoporosis   . Vitamin D deficiency     Past Surgical History  Procedure Date  . Lumbar laminectomy   . Pah   . Tonsillectomy   . Bilateral  rotator cuff surgery   . Esophagogastroduodenoscopy 12/08/2010    Procedure: ESOPHAGOGASTRODUODENOSCOPY (EGD);  Surgeon: Hart Carwin, MD;  Location: [facility];  Service: Endoscopy;  Laterality: N/A;    Family History  Problem Relation Age of Onset  . Dementia Mother   . Hypertension Father   . Malignant hyperthermia Father     History  Substance Use Topics  . Smoking status: Never Smoker   . Smokeless tobacco: Never Used  . Alcohol Use: No    OB History    Grav Para Term Preterm Abortions TAB SAB Ect Mult Living                  Review of Systems  Constitutional: Negative.   HENT: Negative.   Respiratory: Negative.   Cardiovascular: Negative.   Gastrointestinal: Positive for nausea and blood in stool.  Musculoskeletal: Negative.   Skin: Negative.   Neurological: Negative.   Hematological: Negative.   Psychiatric/Behavioral: Negative.     Allergies  Codeine  Home Medications   Current Outpatient Rx  Name Route Sig  Dispense Refill  . ALPRAZOLAM 0.25 MG PO TABS Oral Take 0.25 mg by mouth 2 (two) times daily. Anxiety and sleep    . VITAMIN C 1000 MG PO TABS Oral Take 1,000 mg by mouth daily.      . ATENOLOL 50 MG PO TABS Oral Take 50 mg by mouth daily.      Marland Kitchen CALCIUM CARBONATE 1500 MG PO TABS Oral Take 2 tablets by mouth daily. Daily     . ERGOCALCIFEROL 50000 UNITS PO CAPS Oral Take 50,000 Units by mouth 2 (two) times a week. Takes 1 tablet every Monday and friday    . ESOMEPRAZOLE MAGNESIUM 40 MG PO CPDR Oral Take 40 mg by mouth daily before breakfast.      . OMEGA-3 FATTY ACIDS 1000 MG PO CAPS Oral Take 1 g by mouth daily.      Marland Kitchen FOLIC ACID 400 MCG PO TABS Oral Take 400 mcg by mouth daily.      . FUROSEMIDE 40 MG PO TABS Oral Take 40 mg by mouth daily.      Marland Kitchen GLUCOSAMINE CHONDR 1500 COMPLX PO CAPS Oral Take 2 capsules by mouth daily. Daily     . NIACIN (ANTIHYPERLIPIDEMIC) 500 MG PO TBCR Oral Take 500 mg by mouth at bedtime.      . NON FORMULARY  Blue manna  400 mg daily     . POTASSIUM CHLORIDE CRYS CR 20  MEQ PO TBCR Oral Take 40 mEq by mouth daily.     Marland Kitchen PREDNISONE 10 MG PO TABS Oral Take 1 tablet (10 mg total) by mouth daily. Take 2 pills per day for 2 days, then resume 1 pill daily    . VITAMIN B-12 1000 MCG PO TABS Oral Take 1,000 mcg by mouth daily.      Marland Kitchen VITAMIN E 600 UNITS PO CAPS Oral Take 600 Units by mouth daily.        BP 117/65  Pulse 91  Temp(Src) 99.2 F (37.3 C) (Oral)  Resp 18  SpO2 99%  Physical Exam  Nursing note and vitals reviewed. Constitutional: She appears well-developed and well-nourished. No distress.  HENT:  Head: Normocephalic and atraumatic.       Be determined to dry, pale,anicteric  Eyes: Conjunctivae are normal. Pupils are equal, round, and reactive to light.  Neck: Neck supple. No tracheal deviation present. No thyromegaly present.  Cardiovascular: Normal rate and regular rhythm.   No murmur heard. Pulmonary/Chest: Effort normal and breath sounds normal.   Abdominal: Soft. Bowel sounds are normal. She exhibits no distension. There is no tenderness.       obese  Genitourinary:       Gross blood on rectal exam  Musculoskeletal: Normal range of motion. She exhibits no edema and no tenderness.  Neurological: She is alert. Coordination normal.  Skin: Skin is warm and dry. No rash noted.  Psychiatric: She has a normal mood and affect.    ED Course  Procedures (including critical care time) Spoke with Dr.Tisovec. Will arrange to admit patient  Labs Reviewed  CBC  DIFFERENTIAL  COMPREHENSIVE METABOLIC PANEL  PROTIME-INR  APTT  SAMPLE TO BLOOD BANK   No results found.   No diagnosis found.  Results for orders placed during the hospital encounter of 01/07/11  CBC      Component Value Range   WBC 16.3 (*) 4.0 - 10.5 (K/uL)   RBC 3.91  3.87 - 5.11 (MIL/uL)   Hemoglobin 10.8 (*) 12.0 - 15.0 (g/dL)   HCT 16.1 (*) 09.6 - 46.0 (%)   MCV 84.4  78.0 - 100.0 (fL)   MCH 27.6  26.0 - 34.0 (pg)   MCHC 32.7  30.0 - 36.0 (g/dL)   RDW 04.5 (*) 40.9 - 15.5 (%)   Platelets 359  150 - 400 (K/uL)  DIFFERENTIAL      Component Value Range   Neutrophils Relative 83 (*) 43 - 77 (%)   Neutro Abs 13.5 (*) 1.7 - 7.7 (K/uL)   Lymphocytes Relative 10 (*) 12 - 46 (%)   Lymphs Abs 1.7  0.7 - 4.0 (K/uL)   Monocytes Relative 6  3 - 12 (%)   Monocytes Absolute 1.0  0.1 - 1.0 (K/uL)   Eosinophils Relative 1  0 - 5 (%)   Eosinophils Absolute 0.1  0.0 - 0.7 (K/uL)   Basophils Relative 0  0 - 1 (%)   Basophils Absolute 0.0  0.0 - 0.1 (K/uL)  PROTIME-INR      Component Value Range   Prothrombin Time 12.5  11.6 - 15.2 (seconds)   INR 0.91  0.00 - 1.49   APTT      Component Value Range   aPTT 32  24 - 37 (seconds)   No results found.   MDM  Plan admit patient Dr.Tisovec to arrange GI consultant Diagnosis: GI Bleed #2 anemia        Doug Sou, MD 01/07/11 1547

## 2011-01-07 NOTE — H&P (Signed)
[name] [name], [name] [name] NO.:  [id number]  MEDICAL RECORD NO.:  [id number]  LOCATION:  WA20                         FACILITY:  [facility]  PHYSICIAN:  Gaspar Garbe, M.D.DATE OF BIRTH:  10-25-1940  DATE OF ADMISSION:  01/07/2011 DATE OF DISCHARGE:                             HISTORY & PHYSICAL   CHIEF COMPLAINT:  Bright red blood from below.  HISTORY OF PRESENT ILLNESS:  Patient is a 70 year old female who called shortly after 1 o'clock today after indicating that she had a large bowel movement with approximately a coffee cup worth of bright red blood in it.  She indicated that she felt nervous about it because she has had a previous bleed a month ago and was told that if it ever recurred to come to the emergency room. She was directed to go to the [facility] room where she was examined by the ER physician, found to have bright red blood and grossly heme positive and a hemoglobin of 10.6.  I was asked to admit the patient.  Patient feels reasonably well.  She has had some cramping earlier on in her course, but that seems to have calmed down at this point.  We discussed her prior history, which includes having seen Dr. Juanda Chance for endoscopy from above at her last hospitalization, which was unrevealing and a prior colonoscopy, which have been performed earlier in the year. She has not indicated that she has had any outpatient capsule endoscopy, however.  PAST MEDICAL HISTORY: 1. Benign colon polyps. 2. Presumed lower GI bleed from November 2012. 3. Esophageal reflux. 4. Polymyositis, requiring stress-dose steroids. 5. Seasonal allergic rhinitis. 6. Hypertension. 7. Poor mobility secondary to above. 8. Spinal stenosis and gait instability. 9. Vitamin D deficiency. 10.Anxiety. 11.Dyslipidemia. 12.History of upper GI bleed in June 2011 from gastric, antral, and     vascular ectasia versus GIST. 13.Osteoporosis, receiving Reclast IV  annually. 14.Impaired glucose tolerance. 15.Hypertension. 16.Hyperlipidemia.  MEDICATIONS: 1. Xanax 0.25 mg twice daily as needed for anxiety and sleep. 2. Vitamin C 1000 mg once daily. 3. Atenolol 50 mg once daily. 4. Calcium carbonate two tablets daily. 5. Vitamin D2 50,000 units 2 times weekly. 6. Esomeprazole 40 mg p.o. daily before breakfast. 7. Fish oil 1g daily. 8. Folate 400 mcg daily. 9. Lasix 40 mg daily. 10.Glucosamine/chondroitin two tablets by mouth daily. 11.Niaspan 500 mg p.o. q.h.s. 12.Potassium 40 mEq by mouth daily. 13.Prednisone 10 mg daily. 14.Vitamin B12 1000 mcg by mouth daily. 15.Vitamin E 600 units daily. 16.Blue Manna 400 mg daily as supplement.  PAST SURGICAL HISTORY:  Remote tonsillectomy, total abdominal hysterectomy in 1980, lumbar spine surgery in 1998.  She has had rotator cuff repairs in 2011 and 2012.  FAMILY HISTORY:  Dementia in her mother.  Father has hypertension.  SOCIAL HISTORY:  She is a nonsmoker, nondrinker.  She has 5 children and separated.  She has a son and daughter who live in [place] and multiple family members who come in to visit her.  She also has a visiting nurse due to her disability from chronic low back pain and spinal stenosis.  ALLERGIES:  CODEINE.  She has also had various intolerances to CELLCEPT,  METHOTREXATE, and IVIG in the past.  REVIEW OF SYSTEMS:  Patient notes only the slight abdominal cramping and bright red blood per rectum, but otherwise feels like she is in her normal state of health across 12 systems.  PHYSICAL EXAM:  VITAL SIGNS:  Temperature 99.2, pulse 91, respiratory rate 18, blood pressure 117/65, satting 99% on room air.  GENERAL:  No acute distress. HEENT:  Normocephalic, atraumatic.  PERRLA, EOMI. ENT is within normal limits. NECK:  Supple.  No lymphadenopathy, JVD, or bruit.  HEART:  Regular rate and rhythm.  No murmur, rub, or gallop. LUNGS:  Clear to auscultation  bilaterally. ABDOMEN:  Soft, nontender, normoactive bowel sounds.  EXTREMITIES:  No clubbing, cyanosis, or edema. RECTAL:  Patient is grossly guaiac positive. NEURO:  Patient has some generalized weakness in her legs, but is able to lift them off the bed.  LABORATORY TESTING:  White count elevated at 16.3, hemoglobin 10.8, hematocrit 33, platelets 359.  She does have a left shift with neutrophils.  Protime 12.5, INR 0.91, PTT is 32.  She has an EKG, which is within normal limits.  Electrolytes are currently pending.  ASSESSMENT AND PLAN: 1. Lower gastrointestinal bleed.  I have spoken with Dr. Russella Dar who is     on-call for [facility].  I indicated I would admit the patient and     continue to follow her blood counts over the course of the evening,     possible that they may wish to have a repeat colonoscopy performed     versus capsule endoscopy given as it is Christmas Day.  We do not     have access to Vascular Radiology.  I will try to order a nuclear     scan, but I do not know whether med is open today either.  If it     is, this may help [place] to localize the bleed as it is fairly recent     within the past couple of hours of this dictation.  Patient will     continue to be watched on Telemetry.  She does not require     transfusion at this time and has had issues with concerns for     transfusion as she has had a relative who in the distant past     contracted HIV this way.  I assured her that blood banks monitor     blood considerably different than they did years ago.  She has also     had transfusion within the past month.  We will check iron studies     on her.  She may also have an acute-on-chronic anemia and require     iron replacement, which I do not see on her list of medications. 2. Polymyositis.  We will give her stress-dose steroids by IV just in     case she is unable to take by mouth.  Try to avoid oral steroids     and have limited all NSAIDs. 3. We will switch her  PPI to twice daily by IV at this point until she     has further GI investigation. 4. Hypertension.  We are going to hold her atenolol given active     bleeding.  She has been started on normal saline.  I will add     potassium at 20 mEq/liter to this as she is normally on a potassium     supplement.  Her labs are currently pending for this and we  will re-     look at this again in the morning.     Gaspar Garbe, M.D.     RWT/MEDQ  D:  01/07/2011  T:  01/07/2011  Job:  [id number]

## 2011-01-08 LAB — COMPREHENSIVE METABOLIC PANEL
ALT: 24 U/L (ref 0–35)
Alkaline Phosphatase: 58 U/L (ref 39–117)
BUN: 17 mg/dL (ref 6–23)
CO2: 23 mEq/L (ref 19–32)
Chloride: 107 mEq/L (ref 96–112)
GFR calc Af Amer: >90 mL/min (ref >90)
Glucose, Bld: 107 mg/dL — ABNORMAL HIGH (ref 70–99)
Potassium: 4.3 mEq/L (ref 3.5–5.1)
Total Bilirubin: 0.2 mg/dL — ABNORMAL LOW (ref 0.3–1.2)

## 2011-01-08 LAB — FERRITIN: Ferritin: 436 ng/mL — ABNORMAL HIGH (ref 10–291)

## 2011-01-08 LAB — VITAMIN B12: Vitamin B-12: >2000 pg/mL — ABNORMAL HIGH (ref 211–911)

## 2011-01-08 LAB — CBC
MCHC: 32.9 g/dL (ref 30.0–36.0)
Platelets: 278 10*3/uL (ref 150–400)
RDW: 17.5 % — ABNORMAL HIGH (ref 11.5–15.5)
WBC: 11.5 10*3/uL — ABNORMAL HIGH (ref 4.0–10.5)

## 2011-01-08 LAB — HEMOGLOBIN AND HEMATOCRIT, BLOOD
HCT: 28.7 % — ABNORMAL LOW (ref 36.0–46.0)
Hemoglobin: 9.5 g/dL — ABNORMAL LOW (ref 12.0–15.0)

## 2011-01-08 LAB — IRON AND TIBC: Saturation Ratios: 24 % (ref 20–55)

## 2011-01-08 MED ORDER — SODIUM CHLORIDE 0.45 % IV SOLN: Freq: Once | INTRAVENOUS | Status: DC

## 2011-01-08 MED ORDER — PEG-KCL-NACL-NASULF-NA ASC-C 100 G PO SOLR
1.0000 | Freq: Once | ORAL | Status: AC
  Administered 2011-01-08: 100 g via ORAL
  Filled 2011-01-08: qty 1

## 2011-01-08 NOTE — Progress Notes (Signed)
Explained PICC procedure, risks and benefits to patient. Patient refusing procedure at this time. Started peripheral IV.Notified Staff RN

## 2011-01-08 NOTE — Progress Notes (Signed)
[name] Progress Note  SUBJECTIVE: She feels okay, no further bleeding.  OBJECTIVE:  Vital signs in last 24 hours: Temp:  [97.6 F (36.4 C)-99.2 F (37.3 C)] 98.6 F (37 C) (12/26 0521) Pulse Rate:  [75-91] 84  (12/26 0521) Resp:  [18] 18  (12/26 0521) BP: (114-122)/(60-79) 122/69 mmHg (12/26 0521) SpO2:  [98 %-99 %] 98 % (12/26 0521) Weight:  [83.9 kg (184 lb 15.5 oz)-84.55 kg (186 lb 6.4 oz)] 184 lb 15.5 oz (83.9 kg) (12/26 0521) Last BM Date: 01/07/11 General:   Well-developed, black female in NAD Heart:  Regular rate and rhythm Abdomen:  Soft, nontender and nondistended. Normal bowel sounds. Neurologic:  Alert and  oriented x4;  grossly normal neurologically. Psych:  Cooperative. Normal mood and affect.  I Lab Results:  Basename 01/08/11 0444 01/07/11 2244 01/07/11 1510  WBC 11.5* -- 16.3*  HGB 9.6* 9.9* 10.8*  HCT 29.2* 30.7* 33.0*  PLT 278 -- 359   BMET  Basename 01/08/11 0444 01/07/11 1555  NA 142 137  K 4.3 4.0  CL 107 100  CO2 23 25  GLUCOSE 107* 88  BUN 17 22  CREATININE 0.54 0.63  CALCIUM 9.0 9.7   LFT  Basename 01/08/11 0444  PROT 6.2  ALBUMIN 3.0*  AST 24  ALT 24  ALKPHOS 58  BILITOT 0.2*  BILIDIR --  IBILI --   PT/INR  Basename 01/07/11 1510  LABPROT 12.5  INR 0.91    Studies/Results: Nm Gi Blood Loss  01/07/2011  *RADIOLOGY REPORT*  Clinical Data: Gas intestinal bleeding., bright red stool per rectum  NUCLEAR MEDICINE GASTROINTESTINAL BLEEDING STUDY  Technique:  Sequential abdominal images were obtained following intravenous administration of Tc-54m labeled red blood cells.  Radiopharmaceutical: 19.4 mCi technetium 99  Comparison: The CT 03/31/2009  Findings: There is no endoluminal red blood cell activity to suggest active gastrointestinal bleeding within the abdomen or pelvis.  Physiologic uptake noted within the liver, stomach, spleen, and blood pool.  IMPRESSION: No scintigraphic evidence of active gastrointestinal  bleeding.  Original Report Authenticated By: Genevive Bi, M.D.     ASSESSMENT / PLAN: 1.  Painless hematochezia, resolved. No BMs or bleeding since 1pm yesterday. Bleeding possibly secondary to AVMs or possibly diverticular though no mention of diverticular disease on last colonoscopy in 2010. Ischemia doubtful in absence of abdominal pain and a normal WBC. Tagged RBC scan yesterday was negative. Patient will probably need a colonoscopy for further evaluation. Her hemoglobin has stable overnight.   2. Anemia of acute blood loss. She has had a little more than a 1 gram drop in hemoglobin since yesterday though no further bleeding and hemoglobin stable since last night. 3. Polymyositis, on chronic Prednisone.  4. History of adenomatous colon polyps with villous features. Surveillance colonoscopy was planned for March 2013.    LOS: 1 day   Willette Cluster  01/08/2011, 9:19 AM

## 2011-01-08 NOTE — Progress Notes (Signed)
I have taken an interval history, reviewed the chart and examined the patient. I agree with the extenders note, impression and recommendations. Colonoscopy is planned for tomorrow.  Venita Lick. Russella Dar MD Clementeen Graham 01/08/2011

## 2011-01-08 NOTE — Progress Notes (Signed)
Subjective: Admitted yesterday with GI bleed.  No large amounts since admission and her nuclear scan did not localize an area of active bleeding.  Has yet to be seen by GI this AM, no prep given.  We discussed that she needs to notify the nurse if bleeding recurs.  Objective: Vital signs in last 24 hours: Temp:  [97.6 F (36.4 C)-99.2 F (37.3 C)] 98.6 F (37 C) (12/26 0521) Pulse Rate:  [75-91] 84  (12/26 0521) Resp:  [18] 18  (12/26 0521) BP: (114-122)/(60-79) 122/69 mmHg (12/26 0521) SpO2:  [98 %-99 %] 98 % (12/26 0521) Weight:  [83.9 kg (184 lb 15.5 oz)-84.55 kg (186 lb 6.4 oz)] 184 lb 15.5 oz (83.9 kg) (12/26 0521) Weight change:  Last BM Date: 01/07/11  Intake/Output from previous day: 12/25 0701 - 12/26 0700 In: 510 [P.O.:360; I.V.:150] Out: -  Intake/Output this shift:    General appearance: alert, appears stated age and no distress Resp: clear to auscultation bilaterally Cardio: regular rate and rhythm, S1, S2 normal, no murmur, click, rub or gallop GI: soft, non-tender; bowel sounds normal; no masses,  no organomegaly Extremities: extremities normal, atraumatic, no cyanosis or edema  Lab Results:  Basename 01/08/11 0444 01/07/11 2244 01/07/11 1510  WBC 11.5* -- 16.3*  HGB 9.6* 9.9* --  HCT 29.2* 30.7* --  PLT 278 -- 359   BMET  Basename 01/08/11 0444 01/07/11 1555  NA 142 137  K 4.3 4.0  CL 107 100  CO2 23 25  GLUCOSE 107* 88  BUN 17 22  CREATININE 0.54 0.63  CALCIUM 9.0 9.7    Studies/Results: Nm Gi Blood Loss  01/07/2011  *RADIOLOGY REPORT*  Clinical Data: Gas intestinal bleeding., bright red stool per rectum  NUCLEAR MEDICINE GASTROINTESTINAL BLEEDING STUDY  Technique:  Sequential abdominal images were obtained following intravenous administration of Tc-59m labeled red blood cells.  Radiopharmaceutical: 19.4 mCi technetium 99  Comparison: The CT 03/31/2009  Findings: There is no endoluminal red blood cell activity to suggest active gastrointestinal  bleeding within the abdomen or pelvis.  Physiologic uptake noted within the liver, stomach, spleen, and blood pool.  IMPRESSION: No scintigraphic evidence of active gastrointestinal bleeding.  Original Report Authenticated By: Genevive Bi, M.D.    Medications:  I have reviewed the patient's current medications. Scheduled:   . sodium chloride   Intravenous Once  . methylPREDNISolone (SOLU-MEDROL) injection  40 mg Intravenous Q24H  . pantoprazole (PROTONIX) IV  40 mg Intravenous Q12H   Continuous:   . 0.9 % NaCl with KCl 20 mEq / L 125 mL/hr at 01/08/11 1610   RUE:[id number], acetaminophen, LORazepam, ondansetron (ZOFRAN) IV, ondansetron, technetium labeled red blood cells, zolpidem, DISCONTD: zolpidem  Assessment/Plan: 1)  Lower GI Bleed.  Will put diet back to clear liquid as no procedure planned for this AM and no sign of active bleed.  Nuclear scan was negative as well.  Check H+H in evening or she is to notify nursing if she has any more gross blood from below. 2) HTN.  Meds held due to bleed 3) Polymyositis.  On stress dose steroids by IV. 4) PPI per IV, SCD's for DVT prophylaxis. GI to see later today and ? Plan colonoscopy tomorrow vs capsule endoscopy.   LOS: 1 day   Emira Eubanks W 01/08/2011, 7:51 AM

## 2011-01-09 ENCOUNTER — Encounter (HOSPITAL_COMMUNITY): Payer: Self-pay | Admitting: Gastroenterology

## 2011-01-09 ENCOUNTER — Other Ambulatory Visit: Payer: Self-pay | Admitting: Gastroenterology

## 2011-01-09 ENCOUNTER — Encounter (HOSPITAL_COMMUNITY)
Admission: EM | Disposition: A | Discharge status: Home / Self Care | Payer: Self-pay | Source: Home / Self Care | Attending: Internal Medicine

## 2011-01-09 DIAGNOSIS — D126 Benign neoplasm of colon, unspecified: Secondary | ICD-10-CM

## 2011-01-09 HISTORY — PX: COLONOSCOPY: SHX5424

## 2011-01-09 LAB — CBC
HCT: 28.4 % — ABNORMAL LOW (ref 36.0–46.0)
Hemoglobin: 9.4 g/dL — ABNORMAL LOW (ref 12.0–15.0)
MCH: 27.7 pg (ref 26.0–34.0)
MCHC: 33.1 g/dL (ref 30.0–36.0)
RDW: 17.8 % — ABNORMAL HIGH (ref 11.5–15.5)

## 2011-01-09 LAB — BASIC METABOLIC PANEL
BUN: 11 mg/dL (ref 6–23)
Calcium: 9.2 mg/dL (ref 8.4–10.5)
Creatinine, Ser: 0.53 mg/dL (ref 0.50–1.10)
GFR calc Af Amer: >90 mL/min (ref >90)
GFR calc non Af Amer: >90 mL/min (ref >90)
Glucose, Bld: 117 mg/dL — ABNORMAL HIGH (ref 70–99)
Potassium: 4.2 mEq/L (ref 3.5–5.1)

## 2011-01-09 SURGERY — COLONOSCOPY
Anesthesia: Moderate Sedation

## 2011-01-09 MED ORDER — SODIUM CHLORIDE 0.9 % IV SOLN
INTRAVENOUS | Status: DC
  Administered 2011-01-09: 08:00:00 via INTRAVENOUS

## 2011-01-09 MED ORDER — PREDNISONE 20 MG PO TABS
40.0000 mg | ORAL_TABLET | Freq: Every day | ORAL | Status: DC
  Administered 2011-01-09 – 2011-01-10 (×2): 40 mg via ORAL
  Filled 2011-01-09 (×3): qty 2

## 2011-01-09 MED ORDER — MIDAZOLAM HCL 5 MG/5ML IJ SOLN
INTRAMUSCULAR | Status: DC | PRN
  Administered 2011-01-09 (×4): 2 mg via INTRAVENOUS

## 2011-01-09 MED ORDER — ATENOLOL 50 MG PO TABS
50.0000 mg | ORAL_TABLET | Freq: Every day | ORAL | Status: DC
  Administered 2011-01-09 – 2011-01-10 (×2): 50 mg via ORAL
  Filled 2011-01-09 (×3): qty 1

## 2011-01-09 MED ORDER — PANTOPRAZOLE SODIUM 40 MG PO TBEC
40.0000 mg | DELAYED_RELEASE_TABLET | Freq: Two times a day (BID) | ORAL | Status: DC
  Administered 2011-01-09 – 2011-01-10 (×3): 40 mg via ORAL
  Filled 2011-01-09 (×6): qty 1

## 2011-01-09 MED ORDER — FENTANYL NICU IV SYRINGE 50 MCG/ML
INJECTION | INTRAMUSCULAR | Status: DC | PRN
  Administered 2011-01-09 (×3): 25 ug via INTRAVENOUS

## 2011-01-09 NOTE — Progress Notes (Signed)
I have taken an interval history, reviewed the chart and examined the patient. I agree with the extenders note, impression and recommendations. We will sign off. Dr. Christella Hartigan can see in office as needed for GI follow up.  Venita Lick. Russella Dar MD Clementeen Graham 01/09/2011

## 2011-01-09 NOTE — Op Note (Signed)
[facility] [address] [place], [place]  [address]  COLONOSCOPY PROCEDURE REPORT  PATIENT:  [name] [name], [name] [name]  MR#:  [id number] BIRTHDATE:  05-30-1940, 70 yrs. old  GENDER:  female ENDOSCOPIST:  Judie Petit T. Russella Dar, MD, [facility]  PROCEDURE DATE:  01/09/2011 PROCEDURE:  Colonoscopy with biopsy and snare polypectomy ASA CLASS:  Class II INDICATIONS:  1) hematochezia  2) history of pre-cancerous (adenomatous) colon polyps MEDICATIONS:   These medications were titrated to patient response per physician's verbal order, Fentanyl 75 mcg IV, Versed 6 mg IV DESCRIPTION OF PROCEDURE:  After the risks benefits and alternatives of the procedure were thoroughly explained, informed consent was obtained.  Digital rectal exam was performed and revealed no abnormalities.  The Pentax Ped Colon [id number] endoscope was introduced through the anus and advanced to the cecum, which was identified by both the appendix and ileocecal valve, without limitations.  The quality of the prep was excellent, using MoviPrep.  The instrument was then slowly withdrawn as the colon was fully examined.  <<PROCEDUREIMAGES>>  FINDINGS:  An ulcer was found in the cecum. It was erythematous and friable. It was 4 mm in size.  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 A sessile polyp was found in the cecum. It was 4 mm in size. The polyp was removed using cold biopsy forceps.  A sessile polyp was found in the proximal transverse colon. It was 4 mm in size. The polyp was removed using cold biopsy forceps.  A sessile polyp was found in the distal transverse colon. It was 5 mm in size. Polyp was snared without cautery. Retrieval was successful.  A sessile polyp was found in the sigmoid colon. It was 6 mm in size. Polyp was snared without cautery. Retrieval was successful. Mild diverticulosis was found in the sigmoid colon. Otherwise normal colonoscopy without other polyps, masses, vascular ectasias, or inflammatory changes.  Retroflexed  views in the rectum revealed no abnormalities. The time to cecum =  3  minutes. The scope was then withdrawn (time =  11  min) from the patient and the procedure completed.  COMPLICATIONS:  None  ENDOSCOPIC IMPRESSION: 1) 4 mm ulcer in the cecum-suspected site of bleed 2) 4 mm sessile polyp in the cecum 3) 4 mm sessile polyp in the proximal transverse colon 4) 5 mm sessile polyp in the distal transverse colon 5) 6 mm sessile polyp in the sigmoid colon 6) Mild diverticulosis in the sigmoid colon  RECOMMENDATIONS: 1) Hold aspirin, aspirin products, and anti-inflammatory medication for 2 weeks. 2) Await pathology results 3) High fiber diet with liberal fluid intake. 4) Repeat Colonoscopy in 3 years with Dr. Rob Bunting.  Venita Lick. Russella Dar, MD, [facility]  CC:  Kari Baars, MD  n. Rosalie DoctorVenita Lick. Stark at 01/09/2011 09:23 AM  Roderick Pee, [id number]

## 2011-01-09 NOTE — Progress Notes (Signed)
Subjective: Tolerating bowel prep- brown liquid stool.  No further blood in stool.  No abdominal pain.  Objective: Vital signs in last 24 hours: Temp:  [97.6 F (36.4 C)-98.6 F (37 C)] 97.6 F (36.4 C) (12/27 0516) Pulse Rate:  [74-87] 74  (12/27 0516) Resp:  [18] 18  (12/27 0516) BP: (127-146)/(79-83) 146/83 mmHg (12/27 0516) SpO2:  [98 %-100 %] 100 % (12/27 0516) Weight:  [85.4 kg (188 lb 4.4 oz)] 188 lb 4.4 oz (85.4 kg) (12/27 0516) Weight change: 0.85 kg (1 lb 14 oz) Last BM Date: 01/08/11  CBG (last 3)  No results found for this basename: GLUCAP:3 in the last 72 hours  Intake/Output from previous day: 12/26 0701 - 12/27 0700 In: 2512.5 [P.O.:2080; I.V.:432.5] Out: 1300 [Urine:1300] 4BM recorded (bowel prep) Intake/Output this shift: Total I/O In: 440 [P.O.:240; I.V.:200] Out: -   General appearance: alert and no distress Eyes: no scleral icterus Throat: oropharynx moist without erythema Resp: clear to auscultation bilaterally Cardio: regular rate and rhythm, S1, S2 normal, no murmur, click, rub or gallop GI: soft, non-tender; bowel sounds normal; no masses,  no organomegaly Extremities: no clubbing, cyanosis or edema   Lab Results:  [facility] 01/08/11 0444 01/07/11 1555  NA 142 137  K 4.3 4.0  CL 107 100  CO2 23 25  GLUCOSE 107* 88  BUN 17 22  CREATININE 0.54 0.63  CALCIUM 9.0 9.7  MG -- --  PHOS -- --    Basename 01/08/11 0444 01/07/11 1555  AST 24 34  ALT 24 28  ALKPHOS 58 67  BILITOT 0.2* 0.2*  PROT 6.2 6.9  ALBUMIN 3.0* 3.4*    Basename 01/09/11 0534 01/08/11 1748 01/08/11 0444 01/07/11 1510  WBC 12.9* -- 11.5* --  NEUTROABS -- -- -- 13.5*  HGB 9.4* 9.5* -- --  HCT 28.4* 28.7* -- --  MCV 83.8 -- 84.1 --  PLT 277 -- 278 --   Lab Results  Component Value Date   INR 0.91 01/07/2011   INR 0.96 12/04/2010    Basename 01/07/11 1600  VITAMINB12 >2000*  FOLATE >20.0  FERRITIN 436*  TIBC 258  IRON 61  RETICCTPCT 1.9     Studies/Results: Nm Gi Blood Loss  01/07/2011  *RADIOLOGY REPORT*  Clinical Data: Gas intestinal bleeding., bright red stool per rectum  NUCLEAR MEDICINE GASTROINTESTINAL BLEEDING STUDY  Technique:  Sequential abdominal images were obtained following intravenous administration of Tc-59m labeled red blood cells.  Radiopharmaceutical: 19.4 mCi technetium 99  Comparison: The CT 03/31/2009  Findings: There is no endoluminal red blood cell activity to suggest active gastrointestinal bleeding within the abdomen or pelvis.  Physiologic uptake noted within the liver, stomach, spleen, and blood pool.  IMPRESSION: No scintigraphic evidence of active gastrointestinal bleeding.  Original Report Authenticated By: Genevive Bi, M.D.     Medications: Scheduled:   . sodium chloride   Intravenous Once  . methylPREDNISolone (SOLU-MEDROL) injection  40 mg Intravenous Q24H  . pantoprazole (PROTONIX) IV  40 mg Intravenous Q12H  . peg 3350 powder  1 kit Oral Once   Continuous:   . 0.9 % NaCl with KCl 20 mEq / L 50 mL/hr at 01/08/11 1821    Assessment/Plan: Principal Problem: 1. *Hematochezia- No further bleeding.  Bleeding scan unrevealing.  Scheduled for colonoscopy today. Active Problems: 2. Acute blood loss anemia- relatively stable.  Continue to monitor.   3. HYPERTENSION- resume Atenolol. 4. Polymyositis- wean steroids to Prednisone 40mg  daily and taper quickly to home dose of 10mg   daily. 5. Disposition- anticipate discharge tomorrow pending colonoscopy results if Hg stable.    LOS: 2 days   SHAW,W DOUGLAS 01/09/2011, 6:08 AM

## 2011-01-09 NOTE — Interval H&P Note (Signed)
History and Physical Interval Note:  01/09/2011 8:48 AM  [name] [name]  has presented today for surgery, with the diagnosis of hematochezia  The various methods of treatment have been discussed with the patient and family. After consideration of risks, benefits and other options for treatment, the patient has consented to  Procedure(s): COLONOSCOPY as a surgical intervention .  The patients' history has been reviewed, patient examined, no change in status, stable for surgery.  I have reviewed the patients' chart and labs.  Questions were answered to the patient's satisfaction.     Venita Lick. Russella Dar MD Clementeen Graham

## 2011-01-09 NOTE — Progress Notes (Signed)
[facility] Progress Note  SUBJECTIVE: Hungry. Recently back from colonoscopy  OBJECTIVE:  Vital signs in last 24 hours: Temp:  [97.6 F (36.4 C)-98.6 F (37 C)] 98.1 F (36.7 C) (12/27 0751) Pulse Rate:  [74-87] 74  (12/27 0516) Resp:  [13-22] 22  (12/27 0950) BP: (127-166)/(74-110) 150/81 mmHg (12/27 0950) SpO2:  [98 %-100 %] 100 % (12/27 0950) Weight:  [85.4 kg (188 lb 4.4 oz)] 188 lb 4.4 oz (85.4 kg) (12/27 0516) Last BM Date: 01/09/11 General:   Well-developed, black female in NAD Heart:  Regular rate and rhythm Abdomen:  Soft, nontender and nondistended. Normal bowel sounds. Neurologic:  Alert and  oriented x4;  grossly normal neurologically. Psych:  Cooperative. Normal mood and affect.  Lab Results:  Basename 01/09/11 0534 01/08/11 1748 01/08/11 0444 01/07/11 1510  WBC 12.9* -- 11.5* 16.3*  HGB 9.4* 9.5* 9.6* --  HCT 28.4* 28.7* 29.2* --  PLT 277 -- 278 359   BMET  Basename 01/09/11 0534 01/08/11 0444 01/07/11 1555  NA 147* 142 137  K 4.2 4.3 4.0  CL 113* 107 100  CO2 19 23 25   GLUCOSE 117* 107* 88  BUN 11 17 22   CREATININE 0.53 0.54 0.63  CALCIUM 9.2 9.0 9.7   LFT  Basename 01/08/11 0444  PROT 6.2  ALBUMIN 3.0*  AST 24  ALT 24  ALKPHOS 58  BILITOT 0.2*  BILIDIR --  IBILI --    Studies/Results: Nm Gi Blood Loss  01/07/2011  *RADIOLOGY REPORT*  Clinical Data: Gas intestinal bleeding., bright red stool per rectum  NUCLEAR MEDICINE GASTROINTESTINAL BLEEDING STUDY  Technique:  Sequential abdominal images were obtained following intravenous administration of Tc-48m labeled red blood cells.  Radiopharmaceutical: 19.4 mCi technetium 99  Comparison: The CT 03/31/2009  Findings: There is no endoluminal red blood cell activity to suggest active gastrointestinal bleeding within the abdomen or pelvis.  Physiologic uptake noted within the liver, stomach, spleen, and blood pool.  IMPRESSION: No scintigraphic evidence of active gastrointestinal  bleeding.  Original Report Authenticated By: Genevive Bi, M.D.   ASSESSMENT / PLAN: 1.  Painless hematochezia, probably secondary to cecal ulcer seen on colonoscopy this am. 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 Patient denies any NSAID use. Bleeding resolved, hemoglobin stable. No ASA and any other NSAIDS for 2 weeks.   2. Anemia of acute blood loss. Hemoglobin stable.  3. Polymyositis, on chronic Prednisone.  4. History of adenomatous colon polyps with villous features. Colonoscopy yesterday with removal of 4 small polyps. Pathology pending. She needs a surveillance colonoscopy with Dr. Christella Hartigan in 3 years.   Please call with questions. Thanks   LOS: 2 days   Willette Cluster  01/09/2011, 10:04 AM

## 2011-01-10 ENCOUNTER — Encounter (HOSPITAL_COMMUNITY): Payer: Self-pay | Admitting: Gastroenterology

## 2011-01-10 ENCOUNTER — Encounter: Payer: Self-pay | Admitting: Gastroenterology

## 2011-01-10 ENCOUNTER — Encounter (HOSPITAL_COMMUNITY): Payer: Self-pay

## 2011-01-10 DIAGNOSIS — K633 Ulcer of intestine: Secondary | ICD-10-CM | POA: Diagnosis present

## 2011-01-10 LAB — BASIC METABOLIC PANEL
BUN: 15 mg/dL (ref 6–23)
CO2: 21 mEq/L (ref 19–32)
Chloride: 114 mEq/L — ABNORMAL HIGH (ref 96–112)
GFR calc non Af Amer: 88 mL/min — ABNORMAL LOW (ref >90)
Glucose, Bld: 101 mg/dL — ABNORMAL HIGH (ref 70–99)
Potassium: 3.7 mEq/L (ref 3.5–5.1)
Sodium: 144 mEq/L (ref 135–145)

## 2011-01-10 LAB — CBC
HCT: 25.8 % — ABNORMAL LOW (ref 36.0–46.0)
Hemoglobin: 8.4 g/dL — ABNORMAL LOW (ref 12.0–15.0)
RBC: 3.03 MIL/uL — ABNORMAL LOW (ref 3.87–5.11)

## 2011-01-10 LAB — HEMOGLOBIN AND HEMATOCRIT, BLOOD: HCT: 26.9 % — ABNORMAL LOW (ref 36.0–46.0)

## 2011-01-10 MED ORDER — PREDNISONE 10 MG PO TABS: 10.0000 mg | ORAL_TABLET | Freq: Every day | ORAL | Status: DC

## 2011-01-10 NOTE — Discharge Summary (Signed)
DISCHARGE SUMMARY  [name] [name]  MR#: [id number]  DOB:12/16/1940  Date of Admission: 01/07/2011 Date of Discharge: 01/10/2011  Attending Physician:SHAW,W DOUGLAS  Patient's WUJ:WJXB,J Riley Lam, MD, MD  Consults:  [facility]  Discharge Diagnoses: Principal Problem:  *Hematochezia Active Problems:  Acute blood loss anemia  HYPERTENSION  Polymyositis  Cecal ulcer  PAST MEDICAL HISTORY:  1. Benign colon polyps.  2. Presumed lower GI bleed from November 2012.  3. Esophageal reflux.  4. Polymyositis, requiring stress-dose steroids.  5. Seasonal allergic rhinitis.  6. Hypertension.  7. Poor mobility secondary to above.  8. Spinal stenosis and gait instability.  9. Vitamin D deficiency.  10.Anxiety.  11.Dyslipidemia.  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  13.Osteoporosis, receiving Reclast IV annually.  14.Impaired glucose tolerance.  15.Hypertension.  16.Hyperlipidemia.    Discharge Medications: Current Discharge Medication List    CONTINUE these medications which have CHANGED   Details  predniSONE (DELTASONE) 10 MG tablet Take 1 tablet (10 mg total) by mouth daily. Take 2 pills per day for 2 days, then resume 1 pill daily Qty: 34 tablet, Refills: 5      CONTINUE these medications which have NOT CHANGED   Details  acetaminophen (TYLENOL) 500 MG tablet Take 500 mg by mouth every 6 (six) hours as needed. For generic pain relief     ALPRAZolam (XANAX) 0.25 MG tablet Take 0.25 mg by mouth 2 (two) times daily. Anxiety and sleep    Ascorbic Acid (VITAMIN C) 1000 MG tablet Take 1,000 mg by mouth daily.      atenolol (TENORMIN) 50 MG tablet Take 50 mg by mouth daily.      Calcium Carbonate (CALTRATE 600) 1500 MG TABS Take 2 tablets by mouth daily. Daily     ergocalciferol (VITAMIN D2) 50000 UNITS capsule Take 50,000 Units by mouth 2 (two) times a week. Takes 1 tablet every Monday and  friday    esomeprazole (NEXIUM) 40 MG capsule Take 40 mg by mouth daily before breakfast.      fish oil-omega-3 fatty acids 1000 MG capsule Take 1 g by mouth daily.      folic acid (FOLVITE) 400 MCG tablet Take 400 mcg by mouth daily.      furosemide (LASIX) 40 MG tablet Take 40 mg by mouth daily.      potassium chloride SA (K-DUR,KLOR-CON) 20 MEQ tablet Take 40 mEq by mouth daily.     vitamin B-12 (CYANOCOBALAMIN) 1000 MCG tablet Take 1,000 mcg by mouth daily.      vitamin E 600 UNIT capsule Take 600 Units by mouth daily.        STOP taking these medications     niacin (NIASPAN) 500 MG CR tablet         Hospital Procedures: Nm Gi Blood Loss  01/07/2011  *RADIOLOGY REPORT*  Clinical Data: Gas intestinal bleeding., bright red stool per rectum  NUCLEAR MEDICINE GASTROINTESTINAL BLEEDING STUDY  Technique:  Sequential abdominal images were obtained following intravenous administration of Tc-50m labeled red blood cells.  Radiopharmaceutical: 19.4 mCi technetium 99  Comparison: The CT 03/31/2009  Findings: There is no endoluminal red blood cell activity to suggest active gastrointestinal bleeding within the abdomen or pelvis.  Physiologic uptake noted within the liver, stomach, spleen, and blood pool.  IMPRESSION: No scintigraphic evidence of active gastrointestinal bleeding.  Original Report Authenticated By: Genevive Bi, M.D.   Colonoscopy (01/09/11) - ENDOSCOPIC IMPRESSION:  1) 4 mm  ulcer in the cecum-suspected site of bleed  2) 4 mm sessile polyp in the cecum  3) 4 mm sessile polyp in the proximal transverse colon  4) 5 mm sessile polyp in the distal transverse colon  5) 6 mm sessile polyp in the sigmoid colon  6) Mild diverticulosis in the sigmoid colon   History of Present Illness: (From Dr. Deneen Harts admission history and physical): Patient is a 70 year old female who called  shortly after 1 o'clock today after indicating that she had a large  bowel movement with  approximately a coffee cup worth of bright red  blood in it. She indicated that she felt nervous about it because she  has had a previous bleed a month ago and was told that if it ever  recurred to come to the emergency room. She was directed to go to the  [facility] Emergency room where she was examined by the ER physician,  found to have bright red blood and grossly heme positive and a  hemoglobin of 10.6. I was asked to admit the patient.   Patient feels reasonably well. She has had some cramping earlier on in  her course, but that seems to have calmed down at this point. We  discussed her prior history, which includes having seen Dr. Juanda Chance for  endoscopy from above at her last hospitalization, which was unrevealing  and a prior colonoscopy, which have been performed earlier in the year.  She has not indicated that she has had any outpatient capsule endoscopy,  however.    Hospital Course: Ms. Spear was admitted to a medical bed. She had close monitoring of her hemoglobin and hematocrit. A nuclear medicine bleeding scan was performed that showed no site of active bleeding. Her hemoglobin remained relatively stable throughout her hospitalization and she did not require a transfusion. She had no further episodes of bright red blood per rectum after admission. She was evaluated by gastroenterology who recommended inpatient colonoscopy. This was performed on 12/27 and showed a 4 mm ulcer in the cecum which is the suspected site of bleed also noted were 5 colon polyps and mild diverticulosis. Pathology on these findings is pending at this time. A slight decrease in her hemoglobin from 9.5-8.4 was noted. This will be repeated later today and if stable she will be discharged, with close outpatient followup. She was instructed to hold all NSAIDs and aspirin products. Of note, she is on chronic steroids for polymyositis which may be contributing. In addition, her Niaspan will be held as this has  been ordered to cause peptic ulcers as well.  Day of Discharge Exam BP 129/85  Pulse 69  Temp(Src) 97.7 F (36.5 C) (Oral)  Resp 18  Ht 5\' 3"  (1.6 m)  Wt 90.084 kg (198 lb 9.6 oz)  BMI 35.18 kg/m2  SpO2 100%  Physical Exam: General appearance: alert and no distress Eyes: no scleral icterus Throat: oropharynx moist without erythema Resp: clear to auscultation bilaterally Cardio: regular rate and rhythm, S1, S2 normal, no murmur, click, rub or gallop GI: soft, non-tender; bowel sounds normal; no masses,  no organomegaly Extremities: no clubbing, cyanosis or edema  Discharge Labs:  [facility] 01/10/11 0510 01/09/11 0534  NA 144 147*  K 3.7 4.2  CL 114* 113*  CO2 21 19  GLUCOSE 101* 117*  BUN 15 11  CREATININE 0.64 0.53  CALCIUM 8.7 9.2  MG -- --  PHOS -- --    Basename 01/08/11 0444 01/07/11 1555  AST 24 34  ALT 24 28  ALKPHOS 58 67  BILITOT 0.2* 0.2*  PROT 6.2 6.9  ALBUMIN 3.0* 3.4*    Basename 01/10/11 0510 01/09/11 0534 01/07/11 1510  WBC 12.3* 12.9* --  NEUTROABS -- -- 13.5*  HGB 8.4* 9.4* --  HCT 25.8* 28.4* --  MCV 85.1 83.8 --  PLT 263 277 --   Lab Results  Component Value Date   INR 0.91 01/07/2011   INR 0.96 12/04/2010    Basename 01/07/11 1600  VITAMINB12 >2000*  FOLATE >20.0  FERRITIN 436*  TIBC 258  IRON 61  RETICCTPCT 1.9    Discharge instructions: She was instructed to call if she has any further rectal bleeding, abdominal pain, lightheadedness, or dizziness.  Disposition: To home  Follow-up Appts: Follow-up with Dr. Clelia Croft at [facility] in 1 week.  Call for appointment. Followup with Dr. Christella Hartigan at [facility] gastrone[facility]rology  Condition on Discharge: Stable  Tests Needing Follow-up: Repeat CBC in one week  Signed: SHAW,W DOUGLAS 01/10/2011, 6:26 AM

## 2011-01-28 ENCOUNTER — Ambulatory Visit: Payer: Medicare Other | Admitting: Gastroenterology

## 2011-02-18 ENCOUNTER — Encounter: Payer: Self-pay | Admitting: Gastroenterology

## 2011-02-18 ENCOUNTER — Ambulatory Visit (INDEPENDENT_AMBULATORY_CARE_PROVIDER_SITE_OTHER): Payer: Medicare Other | Admitting: Gastroenterology

## 2011-02-18 VITALS — BP 102/60 | HR 67 | Ht 63.0 in

## 2011-02-18 DIAGNOSIS — Z86010 Personal history of colonic polyps: Secondary | ICD-10-CM

## 2011-02-18 DIAGNOSIS — K922 Gastrointestinal hemorrhage, unspecified: Secondary | ICD-10-CM

## 2011-02-18 NOTE — Progress Notes (Signed)
GASTROINTESTINAL PROBLEM LIST:  1. History of colonic adenomas. Colonoscopy January 2007. I removed three colon polyps, one was tubulovillous, 10 mm. Her next colonoscopy has been scheduled for January 2010. Repeat colonoscopy March, 2010 found more adenomas, recall colonoscopy was set at 3 year interval.  2. 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 This is seen by EGD January 2007, small, round. Attempt at a [place] was done two weeks later, but she did not tolerate the procedure very well, due to gagging and discomfort. Repeat EGD January, 2010 found lesion have not changed in size in 3 year interval, still less than 1 cm. Recommend clinical observation only 3. Dyspepsia, September 2012 evaluation: Plain films normal, CBC normal. I felt this was related to her eating habits and recommended 4-5 smaller meals a day rather than 2 large meals a day. 4. 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 stark while hospitalized. He also found several small adenomatous polyps, removed them. She needs recall colonoscopy 3 years from now.   She had also undergone EGD 11/12 one month prior for anemia, rectal bleeding. This was normal per Dr.  Juanda Chance.  HPI: This is a pleasant 71 year old woman whom I last saw as an outpatient several months ago. She was admitted over Thanksgiving and also around Christmas time this past holiday season with GI bleeding. She underwent EGD and also colonoscopy. See those results summarized above.   Has been out of hosp a month, doing "fine."  she has had no repeat of overt GI bleeding. She had labs checked at her primary care's office, I do not have those results currently.  Past Medical History  Diagnosis Date  . Benign neoplasm of colon   . Esophageal reflux   . Polymyositis   . Allergic rhinitis, cause unspecified   . Unspecified essential hypertension   . Hyperlipidemia   . Osteoporosis   . Vitamin d deficiency     Past Surgical  History  Procedure Date  . Lumbar laminectomy   . Tonsillectomy   . Bilateral  rotator cuff surgery   . Esophagogastroduodenoscopy 12/08/2010    Procedure: ESOPHAGOGASTRODUODENOSCOPY (EGD);  Surgeon: Hart Carwin, MD;  Location: [facility];  Service: Endoscopy;  Laterality: N/A;  . Abdominal hysterectomy     partial  . Colonoscopy 01/09/2011    Procedure: COLONOSCOPY;  Surgeon: Eliezer Bottom., MD,FACG;  Location: [facility];  Service: Endoscopy;  Laterality: N/A;    Current Outpatient Prescriptions  Medication Sig Dispense Refill  . acetaminophen (TYLENOL) 500 MG tablet Take 500 mg by mouth every 6 (six) hours as needed. For generic pain relief       . ALPRAZolam (XANAX) 0.25 MG tablet Take 0.25 mg by mouth 2 (two) times daily. Anxiety and sleep      . Ascorbic Acid (VITAMIN C) 1000 MG tablet Take 1,000 mg by mouth daily.        Marland Kitchen atenolol (TENORMIN) 50 MG tablet Take 50 mg by mouth daily.        . Calcium Carbonate (CALTRATE 600) 1500 MG TABS Take 2 tablets by mouth daily. Daily       . ergocalciferol (VITAMIN D2) 50000 UNITS capsule Take 50,000 Units by mouth 2 (two) times a week. Takes 1 tablet every Monday and friday      . esomeprazole (NEXIUM) 40 MG capsule Take 40 mg by mouth daily before breakfast.        . folic acid (FOLVITE) 400  MCG tablet Take 400 mcg by mouth daily.        . furosemide (LASIX) 40 MG tablet Take 40 mg by mouth daily.        . potassium chloride SA (K-DUR,KLOR-CON) 20 MEQ tablet Take 40 mEq by mouth daily.       . predniSONE (DELTASONE) 10 MG tablet Take 1 tablet (10 mg total) by mouth daily. Take 2 pills per day for 2 days, then resume 1 pill daily  34 tablet  5  . vitamin B-12 (CYANOCOBALAMIN) 1000 MCG tablet Take 1,000 mcg by mouth daily.        . vitamin E 600 UNIT capsule Take 600 Units by mouth daily.        . fish oil-omega-3 fatty acids 1000 MG capsule Take 1 g by mouth daily.          Allergies as of 02/18/2011 - Review Complete  02/18/2011  Allergen Reaction Noted  . Codeine Nausea And Vomiting     Family History  Problem Relation Age of Onset  . Dementia Mother   . Hypertension Father   . Malignant hyperthermia Father     History   Social History  . Marital Status: Legally Separated    Spouse Name: N/A    Number of Children: 5  . Years of Education: N/A   Occupational History  . retired     Social History Main Topics  . Smoking status: Never Smoker   . Smokeless tobacco: Current User    Types: Chew  . Alcohol Use: No  . Drug Use: No  . Sexually Active: No   Other Topics Concern  . Not on file   Social History Narrative  . No narrative on file      Physical Exam: BP 102/60  Pulse 67  Ht 5\' 3"  (1.6 m)  SpO2 98% Constitutional:  sits in a wheelchair, obese  Psychiatric: alert and oriented x3 Abdomen: soft, nontender, nondistended, no obvious ascites, no peritoneal signs, normal bowel sounds     Assessment and plan: 71 y.o. female with Recent GI bleeding, personal history of adenomatous colon polyps recall colonoscopy in around 3 years. The lesion noted by colonoscopy was described as a "ulcerated AVM". I looked at the images myself and agree, this is certainly not neoplastic but wasn't unusual blood vessel. She was taking a lot of NSAIDs the month or 2 prior and perhaps that caused this site to bleed. She is off of NSAIDs now and knows not to restart. Hopefully she will not rebleed but if she does she knows to call this office. We will get results of her recent lab tests sent over for review as well.

## 2011-02-18 NOTE — Patient Instructions (Signed)
We will make sure you are in recall system for colonoscopy in about 3 years. Avoid NSAID type pain medicines. Use tylenol for 'routine' aches and pains. We will get recent lab tests from Dr. Alver Fisher office. If you have repeat bleeding, call Dr. Christella Hartigan'.

## 2011-02-21 ENCOUNTER — Telehealth: Payer: Self-pay | Admitting: Gastroenterology

## 2011-02-21 NOTE — Telephone Encounter (Signed)
CBC dated 01/22/2011 showed hemoglobin 9.6.

## 2011-03-02 ENCOUNTER — Telehealth: Payer: Self-pay | Admitting: Gastroenterology

## 2011-03-02 NOTE — Telephone Encounter (Signed)
On call note @ 1030. Noted some gas/bloating and took MOM and a fiber tablet. Had 2 normal BMs then noted a small amount of BRB on tissue paper with 3rd BM. No other symptoms. Advised to stop MOM and fiber for now and observe for more significant bleeding and call or come to ED if that occurs.

## 2011-03-27 ENCOUNTER — Telehealth: Payer: Self-pay | Admitting: Gastroenterology

## 2011-03-27 NOTE — Telephone Encounter (Signed)
Left message on machine to call back  

## 2011-03-27 NOTE — Telephone Encounter (Signed)
Unlikely anything serious.  IF she has significant, continued bleeding she should call back and will need CBC.

## 2011-03-27 NOTE — Telephone Encounter (Signed)
Pt had small amount of BRB in her stool this morning this is the only incident.  She has had some bloating.  She is taking gas ex and Pepto with out relief.  She is not taking any NSAIDS.  She is on nexium daily.  She is on prednisone 10 mg daily for her back prescribed by Dr Eustace Quail.  She was told to call if she saw any more blood.  Please advise

## 2011-03-28 NOTE — Telephone Encounter (Signed)
Pt has been notified she will call if symtoms worsen

## 2011-04-28 ENCOUNTER — Telehealth: Payer: Self-pay | Admitting: Gastroenterology

## 2011-04-29 NOTE — Telephone Encounter (Signed)
Yes, doubling her PPI is a good idea.  She should call in 7-10 days to report on her symptoms

## 2011-04-29 NOTE — Telephone Encounter (Signed)
Pt feels like food is getting stuck in her throat, she has a sensation of fullness long after she eats.  Per Dr Christella Hartigan last note he recommended the pt have smaller more frequent meals.  The pt says the she has been doing this and drinking lots of water.  She is on Nexium daily.  Can she increase to twice daily and see if that helps?

## 2011-04-30 NOTE — Telephone Encounter (Signed)
The pt has been notified and will call in a week to give an update

## 2011-05-13 ENCOUNTER — Inpatient Hospital Stay (HOSPITAL_COMMUNITY)
Admission: EM | Admit: 2011-05-13 | Discharge: 2011-05-15 | DRG: 378 | Disposition: A | Discharge status: Home / Self Care | Payer: Medicare Other | Attending: Internal Medicine | Admitting: Internal Medicine

## 2011-05-13 ENCOUNTER — Encounter (HOSPITAL_COMMUNITY): Payer: Self-pay | Admitting: Emergency Medicine

## 2011-05-13 DIAGNOSIS — Z860100 Personal history of colon polyps, unspecified: Secondary | ICD-10-CM

## 2011-05-13 DIAGNOSIS — D649 Anemia, unspecified: Secondary | ICD-10-CM

## 2011-05-13 DIAGNOSIS — K219 Gastro-esophageal reflux disease without esophagitis: Secondary | ICD-10-CM | POA: Diagnosis present

## 2011-05-13 DIAGNOSIS — E2749 Other adrenocortical insufficiency: Secondary | ICD-10-CM | POA: Diagnosis present

## 2011-05-13 DIAGNOSIS — K921 Melena: Secondary | ICD-10-CM | POA: Diagnosis present

## 2011-05-13 DIAGNOSIS — IMO0002 Reserved for concepts with insufficient information to code with codable children: Secondary | ICD-10-CM

## 2011-05-13 DIAGNOSIS — M899 Disorder of bone, unspecified: Secondary | ICD-10-CM | POA: Diagnosis present

## 2011-05-13 DIAGNOSIS — E2740 Unspecified adrenocortical insufficiency: Secondary | ICD-10-CM | POA: Diagnosis present

## 2011-05-13 DIAGNOSIS — K5731 Diverticulosis of large intestine without perforation or abscess with bleeding: Principal | ICD-10-CM | POA: Diagnosis present

## 2011-05-13 DIAGNOSIS — K625 Hemorrhage of anus and rectum: Secondary | ICD-10-CM

## 2011-05-13 DIAGNOSIS — K633 Ulcer of intestine: Secondary | ICD-10-CM | POA: Diagnosis present

## 2011-05-13 DIAGNOSIS — E785 Hyperlipidemia, unspecified: Secondary | ICD-10-CM | POA: Diagnosis present

## 2011-05-13 DIAGNOSIS — M3320 Polymyositis, organ involvement unspecified: Secondary | ICD-10-CM | POA: Diagnosis present

## 2011-05-13 DIAGNOSIS — D62 Acute posthemorrhagic anemia: Secondary | ICD-10-CM | POA: Diagnosis present

## 2011-05-13 DIAGNOSIS — I10 Essential (primary) hypertension: Secondary | ICD-10-CM | POA: Diagnosis present

## 2011-05-13 LAB — CBC
HCT: 32.3 % — ABNORMAL LOW (ref 36.0–46.0)
Hemoglobin: 10.2 g/dL — ABNORMAL LOW (ref 12.0–15.0)
RBC: 3.94 MIL/uL (ref 3.87–5.11)
RDW: 18.1 % — ABNORMAL HIGH (ref 11.5–15.5)
WBC: 8.6 10*3/uL (ref 4.0–10.5)

## 2011-05-13 LAB — DIFFERENTIAL
Basophils Absolute: 0 10*3/uL (ref 0.0–0.1)
Lymphocytes Relative: 31 % (ref 12–46)
Lymphs Abs: 2.7 10*3/uL (ref 0.7–4.0)
Monocytes Absolute: 0.6 10*3/uL (ref 0.1–1.0)
Neutro Abs: 5.2 10*3/uL (ref 1.7–7.7)

## 2011-05-13 LAB — HEMOGLOBIN AND HEMATOCRIT, BLOOD
HCT: 30.5 % — ABNORMAL LOW (ref 36.0–46.0)
Hemoglobin: 10.2 g/dL — ABNORMAL LOW (ref 12.0–15.0)

## 2011-05-13 LAB — TYPE AND SCREEN
ABO/RH(D): O POS
Antibody Screen: NEGATIVE

## 2011-05-13 LAB — BASIC METABOLIC PANEL
CO2: 25 mEq/L (ref 19–32)
Chloride: 102 mEq/L (ref 96–112)
Creatinine, Ser: 0.96 mg/dL (ref 0.50–1.10)
Glucose, Bld: 69 mg/dL — ABNORMAL LOW (ref 70–99)

## 2011-05-13 LAB — PROTIME-INR: Prothrombin Time: 12.7 seconds (ref 11.6–15.2)

## 2011-05-13 LAB — APTT: aPTT: 28 seconds (ref 24–37)

## 2011-05-13 MED ORDER — FOLIC ACID 400 MCG PO TABS: 400.0000 ug | ORAL_TABLET | Freq: Every day | ORAL | Status: DC

## 2011-05-13 MED ORDER — PANTOPRAZOLE SODIUM 40 MG IV SOLR
40.0000 mg | INTRAVENOUS | Status: DC
  Filled 2011-05-13: qty 40

## 2011-05-13 MED ORDER — SODIUM CHLORIDE 0.9 % IV SOLN: Freq: Once | INTRAVENOUS | Status: DC

## 2011-05-13 MED ORDER — HYDROCODONE-ACETAMINOPHEN 5-325 MG PO TABS
1.0000 | ORAL_TABLET | ORAL | Status: DC | PRN
  Administered 2011-05-14: 1 via ORAL
  Filled 2011-05-13: qty 1

## 2011-05-13 MED ORDER — LORATADINE 10 MG PO TABS
10.0000 mg | ORAL_TABLET | Freq: Every day | ORAL | Status: DC
  Administered 2011-05-13 – 2011-05-14 (×2): 10 mg via ORAL
  Filled 2011-05-13 (×3): qty 1

## 2011-05-13 MED ORDER — PANTOPRAZOLE SODIUM 40 MG IV SOLR
40.0000 mg | Freq: Two times a day (BID) | INTRAVENOUS | Status: DC
  Administered 2011-05-13: 40 mg via INTRAVENOUS
  Filled 2011-05-13 (×2): qty 40

## 2011-05-13 MED ORDER — ACETAMINOPHEN 500 MG PO TABS: 500.0000 mg | ORAL_TABLET | Freq: Four times a day (QID) | ORAL | Status: DC | PRN

## 2011-05-13 MED ORDER — WHITE PETROLATUM GEL
Status: AC
  Administered 2011-05-13: 11:00:00
  Filled 2011-05-13: qty 5

## 2011-05-13 MED ORDER — FOLIC ACID 0.5 MG HALF TAB
0.5000 mg | ORAL_TABLET | Freq: Every day | ORAL | Status: DC
  Administered 2011-05-13 – 2011-05-14 (×2): 0.5 mg via ORAL
  Filled 2011-05-13 (×3): qty 1

## 2011-05-13 MED ORDER — ONDANSETRON HCL 4 MG/2ML IJ SOLN: 4.0000 mg | Freq: Three times a day (TID) | INTRAMUSCULAR | Status: AC | PRN

## 2011-05-13 MED ORDER — ALPRAZOLAM 0.25 MG PO TABS: 0.2500 mg | ORAL_TABLET | Freq: Three times a day (TID) | ORAL | Status: DC | PRN

## 2011-05-13 MED ORDER — VITAMIN B-12 1000 MCG PO TABS
1000.0000 ug | ORAL_TABLET | Freq: Every day | ORAL | Status: DC
  Administered 2011-05-13 – 2011-05-14 (×2): 1000 ug via ORAL
  Filled 2011-05-13 (×3): qty 1

## 2011-05-13 MED ORDER — SODIUM CHLORIDE 0.9 % IV SOLN
Freq: Once | INTRAVENOUS | Status: AC
  Administered 2011-05-13: 1000 mL via INTRAVENOUS

## 2011-05-13 MED ORDER — PREDNISONE 20 MG PO TABS
40.0000 mg | ORAL_TABLET | Freq: Every day | ORAL | Status: DC
  Filled 2011-05-13 (×2): qty 2

## 2011-05-13 MED ORDER — SODIUM CHLORIDE 0.9 % IV SOLN
INTRAVENOUS | Status: AC
  Administered 2011-05-13 (×2): via INTRAVENOUS

## 2011-05-13 NOTE — ED Provider Notes (Addendum)
History     CSN: [id number]  Arrival date & time 05/13/11  7253   First MD Initiated Contact with Patient 05/13/11 0701      Chief Complaint  Patient presents with  . Rectal Bleeding    (Consider location/radiation/quality/duration/timing/severity/associated sxs/prior treatment) HPI Comments: Patient presents today with onset of rectal bleeding this morning.  Patient states she felt well last night when she went to bed.  She notes that she woke up at around 6 AM this morning and went to the bathroom and did not have a bowel movement but noted that she had a clot of blood in other rectal bleeding in the toilet.  When she went back to her bed she also noted there is blood on the sheets as well.  She denies any lightheadedness or dizziness at this time.  She denies any antiplatelet or anticoagulation agents.  She states that she has had a prior history of GI bleeding in the fall around Thanksgiving and Christmas time that was related to a rectal polyp that was removed.  She denies abdominal pain but notes that she feels full and bloated at this time.  No nausea or vomiting.  Patient is a 71 y.o. female presenting with hematochezia. The history is provided by the patient. No language interpreter was used.  Rectal Bleeding  The current episode started today. The problem has been unchanged. The patient is experiencing no pain. Pertinent negatives include no anorexia, no fever, no abdominal pain, no diarrhea, no hematemesis, no hemorrhoids, no nausea, no rectal pain, no vomiting, no hematuria, no vaginal discharge, no chest pain, no headaches, no coughing, no difficulty breathing and no rash.    Past Medical History  Diagnosis Date  . Benign neoplasm of colon   . Esophageal reflux   . Polymyositis   . Allergic rhinitis, cause unspecified   . Unspecified essential hypertension   . Hyperlipidemia   . Osteoporosis   . Vitamin d deficiency     Past Surgical History  Procedure Date  . Lumbar  laminectomy   . Tonsillectomy   . Bilateral  rotator cuff surgery   . Esophagogastroduodenoscopy 12/08/2010    Procedure: ESOPHAGOGASTRODUODENOSCOPY (EGD);  Surgeon: Hart Carwin, MD;  Location: [facility];  Service: Endoscopy;  Laterality: N/A;  . Abdominal hysterectomy     partial  . Colonoscopy 01/09/2011    Procedure: COLONOSCOPY;  Surgeon: Eliezer Bottom., MD,FACG;  Location: [facility];  Service: Endoscopy;  Laterality: N/A;    Family History  Problem Relation Age of Onset  . Dementia Mother   . Hypertension Father   . Malignant hyperthermia Father     History  Substance Use Topics  . Smoking status: Never Smoker   . Smokeless tobacco: Current User    Types: Chew  . Alcohol Use: No    OB History    Grav Para Term Preterm Abortions TAB SAB Ect Mult Living                  Review of Systems  Constitutional: Negative.  Negative for fever and chills.  HENT: Negative.   Eyes: Negative.  Negative for discharge and redness.  Respiratory: Negative.  Negative for cough and shortness of breath.   Cardiovascular: Negative.  Negative for chest pain.  Gastrointestinal: Positive for hematochezia and anal bleeding. Negative for nausea, vomiting, abdominal pain, diarrhea, rectal pain, anorexia, hematemesis and hemorrhoids.  Genitourinary: Negative.  Negative for dysuria, hematuria and vaginal discharge.  Musculoskeletal: Negative.  Negative  for back pain.  Skin: Negative.  Negative for color change and rash.  Neurological: Negative.  Negative for syncope and headaches.  Hematological: Negative.  Negative for adenopathy.  Psychiatric/Behavioral: Negative.  Negative for confusion.  All other systems reviewed and are negative.    Allergies  Codeine  Home Medications   Current Outpatient Rx  Name Route Sig Dispense Refill  . ACETAMINOPHEN 500 MG PO TABS Oral Take 500 mg by mouth every 6 (six) hours as needed. For generic pain relief     . ALPRAZOLAM 0.25 MG PO TABS  Oral Take 0.25 mg by mouth 2 (two) times daily. Anxiety and sleep    . VITAMIN C 1000 MG PO TABS Oral Take 1,000 mg by mouth daily.      . ATENOLOL 50 MG PO TABS Oral Take 50 mg by mouth daily.      Marland Kitchen CALCIUM CARBONATE 1500 MG PO TABS Oral Take 2 tablets by mouth daily. Daily     . ERGOCALCIFEROL 50000 UNITS PO CAPS Oral Take 50,000 Units by mouth 2 (two) times a week. Takes 1 tablet every Monday and friday    . ESOMEPRAZOLE MAGNESIUM 40 MG PO CPDR Oral Take 40 mg by mouth daily before breakfast.      . OMEGA-3 FATTY ACIDS 1000 MG PO CAPS Oral Take 1 g by mouth daily.      Marland Kitchen FOLIC ACID 400 MCG PO TABS Oral Take 400 mcg by mouth daily.      . FUROSEMIDE 40 MG PO TABS Oral Take 40 mg by mouth daily.      Marland Kitchen POTASSIUM CHLORIDE CRYS ER 20 MEQ PO TBCR Oral Take 40 mEq by mouth daily.     Marland Kitchen PREDNISONE 10 MG PO TABS Oral Take 1 tablet (10 mg total) by mouth daily. Take 2 pills per day for 2 days, then resume 1 pill daily 34 tablet 5  . VITAMIN B-12 1000 MCG PO TABS Oral Take 1,000 mcg by mouth daily.      Marland Kitchen VITAMIN E 600 UNITS PO CAPS Oral Take 600 Units by mouth daily.        BP 137/64  Pulse 84  Resp 22  SpO2 100%  Physical Exam  Nursing note and vitals reviewed. Constitutional: She is oriented to person, place, and time. She appears well-developed and well-nourished.  Non-toxic appearance. She does not have a sickly appearance.  HENT:  Head: Normocephalic and atraumatic.  Eyes: Conjunctivae, EOM and lids are normal. Pupils are equal, round, and reactive to light. No scleral icterus.  Neck: Trachea normal and normal range of motion. Neck supple.  Cardiovascular: Normal rate, regular rhythm and normal heart sounds.   Pulmonary/Chest: Effort normal and breath sounds normal. No respiratory distress. She has no wheezes. She has no rales.  Abdominal: Soft. Normal appearance. There is no tenderness. There is no rebound, no guarding and no CVA tenderness.  Genitourinary:       Normal sphincter  tone.  Gross red blood on exam.  No significant stool on exam  Musculoskeletal: Normal range of motion.  Neurological: She is alert and oriented to person, place, and time. She has normal strength.  Skin: Skin is warm, dry and intact. No rash noted.  Psychiatric: She has a normal mood and affect. Her behavior is normal. Judgment and thought content normal.    ED Course  Procedures (including critical care time)  Results for orders placed during the hospital encounter of 05/13/11  CBC  Component Value Range   WBC 8.6  4.0 - 10.5 (K/uL)   RBC 3.94  3.87 - 5.11 (MIL/uL)   Hemoglobin 10.2 (*) 12.0 - 15.0 (g/dL)   HCT 16.1 (*) 09.6 - 46.0 (%)   MCV 82.0  78.0 - 100.0 (fL)   MCH 25.9 (*) 26.0 - 34.0 (pg)   MCHC 31.6  30.0 - 36.0 (g/dL)   RDW 04.5 (*) 40.9 - 15.5 (%)   Platelets 353  150 - 400 (K/uL)  DIFFERENTIAL      Component Value Range   Neutrophils Relative 60  43 - 77 (%)   Neutro Abs 5.2  1.7 - 7.7 (K/uL)   Lymphocytes Relative 31  12 - 46 (%)   Lymphs Abs 2.7  0.7 - 4.0 (K/uL)   Monocytes Relative 7  3 - 12 (%)   Monocytes Absolute 0.6  0.1 - 1.0 (K/uL)   Eosinophils Relative 2  0 - 5 (%)   Eosinophils Absolute 0.2  0.0 - 0.7 (K/uL)   Basophils Relative 0  0 - 1 (%)   Basophils Absolute 0.0  0.0 - 0.1 (K/uL)  BASIC METABOLIC PANEL      Component Value Range   Sodium 141  135 - 145 (mEq/L)   Potassium 3.7  3.5 - 5.1 (mEq/L)   Chloride 102  96 - 112 (mEq/L)   CO2 25  19 - 32 (mEq/L)   Glucose, Bld 69 (*) 70 - 99 (mg/dL)   BUN 33 (*) 6 - 23 (mg/dL)   Creatinine, Ser 8.11  0.50 - 1.10 (mg/dL)   Calcium 9.4  8.4 - 91.4 (mg/dL)   GFR calc non Af Amer 58 (*) >90 (mL/min)   GFR calc Af Amer 67 (*) >90 (mL/min)  TYPE AND SCREEN      Component Value Range   ABO/RH(D) O POS     Antibody Screen PENDING     Sample Expiration 05/16/2011          MDM  Patient with obvious GI bleed here today.  She has had prior GI bleeds related to a polyp.  We will check the  patient's CBC, coags as well as perform a type and screen at this time as the patient did require transfusion in the past.  Currently the patient does have normal vital signs but we will keep her n.p.o. and anticipate an admission for continued monitoring of the patient's bleeding.  Nat Christen, MD 05/13/11 [phone]  Patient with hemoglobin that is actually slightly elevated from her prior levels of 9.  I do believe the patient still warrants admission given her obvious bright red blood per rectum and previous history with bleeding that did require transfusion.  I will contact Dr. Clelia Croft who is her primary care physician for admission of this patient for continued observation of this patient.  Nat Christen, MD 05/13/11 [phone]  Pt discussed with Dr. Clelia Croft for admission.    Nat Christen, MD 05/13/11 [phone]

## 2011-05-13 NOTE — Consult Note (Signed)
[facility] Consult: 2:41 PM 05/13/2011   Referring Provider: Dr Clelia Croft Primary Care Physician:  Kari Baars, MD, MD Primary Gastroenterologist:  Dr. Christella Hartigan  Reason for Consultation:    HPI: [name] [name] is a 71 y.o. female.  Hx of upper GI bleed 08/2006 from antral gastritis vs gastic AVM  Admission with lower GI bleed 11/2010 and 12/2010.  Tagged cell nuclear scan 01/07/11 negative. EGD 11/2010 negative.  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  A series of polyps also removed, sigmoid tics noted.   Telephone contact with GI on 4/16 re: food getting stuck in throat, post-prandial fullness.  Her request for increasing Nexium to twice daily was approved by Dr Christella Hartigan She was to report back to GI office in 7 to 10 days. She is also c/o LUQ fullness/discomfort/bloating that has not improved with BID Nexium, not related to food, not relived by bismuth.    Takes prednisone chronically for polymyositis.   Admitted today with recurrent blood per rectum seen 4 AM today.  She said it did not transform commode water into pure blood but she was concerned when she saw clots of blood. She then saw blood on sheets that had been passed during the night, soft ball sized stain on sheet. . She did not have belly pain, nausea, dizzyness.  No belly pain, constipation prior.  No new meds.  Had no true stool during the bleeding episode.  Has had no recurrence of hematochezia.  Takes no NSAIDs.    Hgb is 10.2 - 9.7, was 9.0 in 01/10/11 Has not rebled since this AM   Past Medical History  Diagnosis Date  . Benign neoplasm of colon   . Esophageal reflux   . Polymyositis   . Allergic rhinitis, cause unspecified   . Unspecified essential hypertension   . Hyperlipidemia   . Osteoporosis   . Vitamin d deficiency     Past Surgical History  Procedure Date  . Lumbar laminectomy   . Tonsillectomy   . Bilateral  rotator cuff surgery    . Esophagogastroduodenoscopy 12/08/2010    Procedure: ESOPHAGOGASTRODUODENOSCOPY (EGD);  Surgeon: Hart Carwin, MD;  Location: [facility];  Service: Endoscopy;  Laterality: N/A;  . Abdominal hysterectomy     partial  . Colonoscopy 01/09/2011    Procedure: COLONOSCOPY;  Surgeon: Eliezer Bottom., MD,FACG;  Location: [facility];  Service: Endoscopy;  Laterality: N/A;    Prior to Admission medications   Medication Sig Start Date End Date Taking? Authorizing Provider  acetaminophen (TYLENOL) 500 MG tablet Take 500 mg by mouth every 6 (six) hours as needed. For generic pain relief    Yes Historical Provider, MD  ALPRAZolam (XANAX) 0.25 MG tablet Take 0.25 mg by mouth 3 (three) times daily as needed. Anxiety and sleep   Yes Historical Provider, MD  Ascorbic Acid (VITAMIN C) 1000 MG tablet Take 1,000 mg by mouth daily.     Yes Historical Provider, MD  atenolol (TENORMIN) 50 MG tablet Take 50 mg by mouth daily.     Yes Historical Provider, MD  Calcium Carbonate (CALTRATE 600) 1500 MG TABS Take 2 tablets by mouth daily. Daily    Yes Historical Provider, MD  ergocalciferol (VITAMIN D2) 50000 UNITS capsule Take 50,000 Units by mouth 2 (two) times a week. Takes 1 tablet every Monday and friday   Yes Historical Provider, MD  esomeprazole (NEXIUM) 40 MG capsule Take 40 mg by mouth 2 (two) times daily.  Yes Historical Provider, MD  folic acid (FOLVITE) 400 MCG tablet Take 400 mcg by mouth daily.     Yes Historical Provider, MD  furosemide (LASIX) 40 MG tablet Take 40 mg by mouth daily.     Yes Historical Provider, MD  loratadine (CLARITIN) 10 MG tablet Take 10 mg by mouth daily.   Yes Historical Provider, MD  potassium chloride SA (K-DUR,KLOR-CON) 20 MEQ tablet Take 40 mEq by mouth daily.    Yes Historical Provider, MD  predniSONE (DELTASONE) 10 MG tablet Take 1 tablet (10 mg total) by mouth daily. Take 2 pills per day for 2 days, then resume 1 pill daily 01/10/11  Yes W Buren Kos, MD  sodium  chloride (OCEAN) 0.65 % nasal spray Place 1 spray into the nose as needed.   Yes Historical Provider, MD  vitamin B-12 (CYANOCOBALAMIN) 1000 MCG tablet Take 1,000 mcg by mouth daily.     Yes Historical Provider, MD  vitamin E 600 UNIT capsule Take 600 Units by mouth daily.     Yes Historical Provider, MD    Scheduled Meds:    . sodium chloride   Intravenous STAT  . sodium chloride   Intravenous Once  . folic acid  0.5 mg Oral Daily  . loratadine  10 mg Oral Daily  . pantoprazole (PROTONIX) IV  40 mg Intravenous Q12H  . predniSONE  40 mg Oral QAC breakfast  . vitamin B-12  1,000 mcg Oral Daily  . white petrolatum      . DISCONTD: sodium chloride   Intravenous Once  . DISCONTD: folic acid  400 mcg Oral Daily   Infusions:   PRN Meds: acetaminophen, ALPRAZolam, HYDROcodone-acetaminophen, ondansetron (ZOFRAN) IV   Allergies as of 05/13/2011 - Review Complete 05/13/2011  Allergen Reaction Noted  . Codeine Nausea And Vomiting     Family History  Problem Relation Age of Onset  . Dementia Mother   . Hypertension Father   . Malignant hyperthermia Father     History   Social History  . Marital Status: Legally Separated    Spouse Name: N/A    Number of Children: 5  . Years of Education: N/A   Occupational History  . retired     Social History Main Topics  . Smoking status: Never Smoker   . Smokeless tobacco: Current User    Types: Snuff  . Alcohol Use: No  . Drug Use: No  . Sexually Active: No   Other Topics Concern  . Not on file   Social History Narrative  . No narrative on file    REVIEW OF SYSTEMS: Constitutional:  No weight loss or gain.  Chronic weakness ENT:  No nose bleeds or congestion Pulm:  No sob, some non-productive cough CV:  No palps or chest pain GU:   No hematuria, no dysuria GI:  Above.  No dysphagia, no heartburn Heme:  Has required po iron in past.  Takes oral b12 daily   Transfusions:  Yes during previous admits Neuro:  No headache.  No  numbness. musc skel:  Unable to walk or stand due to leg weakness. Depends on electric wheelchair.  Derm:  No rash or sores Endocrine:  No increased urination.  No sweats Immunization:  Not sure of what she has had but flu shot current Travel:  None.   PHYSICAL EXAM: Vital signs in last 24 hours: Temp:  [97.7 F (36.5 C)-97.9 F (36.6 C)] 97.7 F (36.5 C) (04/30 1300) Pulse Rate:  [72-84] 72  (  04/30 1300) Resp:  [16-22] 16  (04/30 1300) BP: (118-137)/(60-71) 118/71 mmHg (04/30 1300) SpO2:  [100 %] 100 % (04/30 1300)  General: obese, cushingoid looking AAF Head:  Normocephallic, atraumatic  Eyes:  No conj pallor or icterus Ears:  Not HOH  Nose:  Sounds congested Mouth:  Moist, clear oral MM Neck:  No masses, no JVD Lungs:  Clear B.  Some dry cough. Heart: RRR.  No MRG Abdomen:  Soft, ND, obese.  Slightly uncomfortable at LUQ, no guard or rebound.   Rectal: deep red blood on glove, no mass   Musc/Skeltl: no joint erythema or swelling Extremities:  No pedal edema  Neurologic:  Moves all 4s, no tremor.  Fully oriented Skin:  No rash Tattoos:  None seen Nodes:  No adenopathy at neck or groin   Psych:  Pleasant, not agitated or depressed.  Intake/Output from previous day:   Intake/Output this shift:    LAB RESULTS:  Basename 05/13/11 0815  WBC 8.6  HGB 10.2*  HCT 32.3*  PLT 353   BMET Lab Results  Component Value Date   NA 141 05/13/2011   NA 144 01/10/2011   NA 147* 01/09/2011   K 3.7 05/13/2011   K 3.7 01/10/2011   K 4.2 01/09/2011   CL 102 05/13/2011   CL 114* 01/10/2011   CL 113* 01/09/2011   CO2 25 05/13/2011   CO2 21 01/10/2011   CO2 19 01/09/2011   GLUCOSE 69* 05/13/2011   GLUCOSE 101* 01/10/2011   GLUCOSE 117* 01/09/2011   BUN 33* 05/13/2011   BUN 15 01/10/2011   BUN 11 01/09/2011   CREATININE 0.96 05/13/2011   CREATININE 0.64 01/10/2011   CREATININE 0.53 01/09/2011   CALCIUM 9.4 05/13/2011   CALCIUM 8.7 01/10/2011   CALCIUM 9.2 01/09/2011    LFT No results found for this basename: PROT:3,ALBUMIN:3,AST:3,ALT:3,ALKPHOS:3,BILITOT:3,BILIDIR:3,IBILI:3 in the last 72 hours PT/INR Lab Results  Component Value Date   INR 0.93 05/13/2011   INR 0.91 01/07/2011   INR 0.96 12/04/2010   ENDOSCOPIC STUDIES: 01/09/11 colonoscopy    Dr Russella Dar 4 polyps removed from cecum, transverse and sigmoid colon.  Friable ulcer at cecum, ? ulcerated  AVM.  Sigmoid tics.  Path:  Tubular adenoma and hyperplastic polyps  12/08/2010   EGD   Lina Sar Normal study.  03/2008 colonoscopy  Polyps removed.   03/2009  EGD   Erosive gastritis.    IMPRESSION: *  Lower GI bleed.  ? Diverticular, ? From the cecum where she had friable uler/? AVM in 12/2010 *  Anemia, chronic. Not worse than during previous admission.  *  Vague LUQ pressure/discomfort.   *  Small subepithelial lesion seen at EGD Jan 2007.  Follow up egd Jan 2010 have shown stable size to lesion.  No lesion seen in 11/2010   PLAN: 1.  Per dr Juanda Chance:  Observe for now.  No plans for colonoscopy right now. May consider this if bleeding persists.  2.  Given the hx of LUQ discomfort will check LFTs and lipase in AM though low liklihood of pancreatic/biliary cause to LUQ pain.    LOS: 0 days   Jennye Moccasin  05/13/2011, 2:41 PM Pager: [phone]

## 2011-05-13 NOTE — H&P (Signed)
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 She was hospitalized 11/12 and 12/12 for bright red blood per rectum. 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 This was treated supportively with no recurrent bleeding since that time.  She has been followed in the GI clinic for the past several years for her history of colon polyps and gastric mucosal lesion in 2008. She saw Dr. Christella Hartigan most recently in 2/13.  She states she developed increased bloating and gas over the past 2-3 weeks. She called Dr. Larae Grooms office and they recommended increasing her Nexium to twice a day.  She noticed a little bit of improvement with this. Otherwise, she has not had any abdominal pain or change in her bowel movements until this morning. When she awoke this morning around 4 AM she noted blood on her sheets. She proceeded to the bathroom where she had a large bloody bowel movement with a bright red blood clot. She states that this was very similar to the appearance of her stool in 11/12 and 12/12. She denies any NSAID or aspirin use. She remains on prednisone for polymyositis. She felt a little weak this morning but denies any lightheadedness, chest pain, or increasing weakness at this point. Given her symptoms she came to the emergency department where her hemoglobin was 10.2 with a BUN and creatinine of 33 and 0.96 respectively. She is being admitted for further management.  Review of Systems:  Review of Systems - recent increase in allergy symptoms. Call her office yesterday was  recommended Claritin and Flonase which she used with some improvement. Generalized weakness is unchanged despite physical therapy. She is now seeing a new rheumatologist who is considering an alternative to prednisone. All the systems are negative except as in history of present illness.  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 Lumbar laminectomy   . Tonsillectomy   . Bilateral  rotator cuff surgery   . Esophagogastroduodenoscopy 12/08/2010    Procedure: ESOPHAGOGASTRODUODENOSCOPY (EGD);  Surgeon: Hart Carwin, MD;  Location: [facility];  Service: Endoscopy;  Laterality: N/A;  . Abdominal hysterectomy     partial  . Colonoscopy 01/09/2011    Procedure: COLONOSCOPY;  Surgeon: Eliezer Bottom., MD,FACG;  Location: [facility];  Service: Endoscopy;  Laterality: N/A;     Medications: Prior to Admission medications   Medication Sig Start Date End Date Taking? Authorizing Provider  acetaminophen (TYLENOL) 500 MG tablet Take 500 mg by mouth every 6 (six) hours as needed. For generic pain relief    Yes Historical Provider, MD  ALPRAZolam (XANAX) 0.25 MG tablet Take 0.25 mg by mouth 3 (three) times daily as needed. Anxiety and sleep   Yes Historical Provider, MD  Ascorbic Acid (VITAMIN C) 1000 MG tablet Take 1,000 mg by mouth  daily.     Yes Historical Provider, MD  atenolol (TENORMIN) 50 MG tablet Take 50 mg by mouth daily.     Yes Historical Provider, MD  Calcium Carbonate (CALTRATE 600) 1500 MG TABS Take 2 tablets by mouth daily. Daily    Yes Historical Provider, MD  ergocalciferol (VITAMIN D2) 50000 UNITS  capsule Take 50,000 Units by mouth 2 (two) times a week. Takes 1 tablet every Monday and friday   Yes Historical Provider, MD  esomeprazole (NEXIUM) 40 MG capsule Take 40 mg by mouth 2 (two) times daily.    Yes Historical Provider, MD  folic acid (FOLVITE) 400 MCG tablet Take 400 mcg by mouth daily.     Yes Historical Provider, MD  furosemide (LASIX) 40 MG tablet Take 40 mg by mouth daily.     Yes Historical Provider, MD  loratadine (CLARITIN) 10 MG tablet Take 10 mg by mouth daily.   Yes Historical Provider, MD  potassium chloride SA (K-DUR,KLOR-CON) 20 MEQ tablet Take 40 mEq by mouth daily.    Yes Historical Provider, MD  predniSONE (DELTASONE) 10 MG tablet Take 1 tablet (10 mg total) by mouth daily. Take 2 pills per day for 2 days, then resume 1 pill daily 01/10/11  Yes W Buren Kos, MD  sodium chloride (OCEAN) 0.65 % nasal spray Place 1 spray into the nose as needed.   Yes Historical Provider, MD  vitamin B-12 (CYANOCOBALAMIN) 1000 MCG tablet Take 1,000 mcg by mouth daily.     Yes Historical Provider, MD  vitamin E 600 UNIT capsule Take 600 Units by mouth daily.     Yes Historical Provider, MD    Allergies:   Allergies  Allergen Reactions  . Codeine Nausea And Vomiting    Feels drunk    Social History: "Jeslie" separated. 5 children (2 in [place]), son killed gsw 11th grade education. disabled due to back pain No tobacco, ETOH, drug use  Family History: F- HTN, CVA M-AD, CVA sis, 2 bros- healthy bro- d seizure bro- ETOHism, Cirrhosis children healthy  Physical Exam: Filed Vitals:   05/13/11 0708 05/13/11 1143  BP: 137/64 121/60  Pulse: 84 79  Temp:  97.9 F (36.6 C)  TempSrc:  Oral  Resp: 22 18  SpO2: 100% 100%   General appearance: alert and no distress Head: Normocephalic, without obvious abnormality, atraumatic Eyes: conjunctivae/corneas clear. PERRL, EOM's intact.  no scleral pallor Nose: Nares normal. Septum midline. Mucosa normal. No drainage or sinus  tenderness. Throat: lips, mucosa, and tongue normal; teeth and gums normal Neck: no adenopathy, no carotid bruit, no JVD and thyroid not enlarged, symmetric, no tenderness/mass/nodules Resp: clear to auscultation bilaterally Cardio: regular rate and rhythm, S1, S2 normal, no murmur, click, rub or gallop GI: soft, nondistended; bowel sounds normal; no masses,  no organomegaly. Tenderness in the left upper quadrant. No rebound or guarding. Extremities: extremities normal, atraumatic, no cyanosis or edema Pulses: 2+ and symmetric Lymph nodes: Cervical adenopathy: no cervical lymphadenopathy Neurologic: Alert and oriented X 3, proximal muscle weakness in the arms and legs is unchanged.   Labs on Admission:   [facility] 05/13/11 0815  NA 141  K 3.7  CL 102  CO2 25  GLUCOSE 69*  BUN 33*  CREATININE 0.96  CALCIUM 9.4  MG --  PHOS --   No results found for this basename: AST:2,ALT:2,ALKPHOS:2,BILITOT:2,PROT:2,ALBUMIN:2 in the last 72 hours No results found for this basename: LIPASE:2,AMYLASE:2 in the last 72 hours  Basename 05/13/11 0815  WBC 8.6  NEUTROABS 5.2  HGB 10.2*  HCT 32.3*  MCV 82.0  PLT 353   No results found for this basename: CKTOTAL:3,CKMB:3,CKMBINDEX:3,TROPONINI:3 in the last 72 hours Lab Results  Component Value Date   INR 0.93 05/13/2011   INR 0.91 01/07/2011   INR 0.96 12/04/2010    Radiological Exams on Admission: No results found. Orders placed during the hospital encounter of 01/07/11  . EKG    Assessment/Plan Principal Problem: 1. *Hematochezia-  recurrent bright red blood per rectum is likely related to her known cecal ulcer/AVM.  She is having some increased bloating and left upper quadrant tenderness with a known history of gastric mucosal lesion (question GI ST). We'll consult GI to consider endoscopic evaluation. If this appears to be a recurrent cecal bleeding source he may need to consider surgical management. Continue PPI twice a day (change to  IV).  Continue supportive management with IV fluid hydration and frequent hemoglobin checks.  Active Problems: 2. Acute blood loss anemia- we'll monitor serial hemoglobin checks and transfuse if hemoglobin drops below 8.0 3. HYPERTENSION- hold antihypertensive therapy until bleeding is stabilized. 4. GASTROESOPHAGEAL REFLUX DISEASE- continue PPI twice a day. 5. Polymyositis- will provide stress dose steroids.  She is scheduled to follow up with rheumatology to adjust medications. 6. Adrenal insufficiency- increase prednisone to 40 mg daily for stress dose. Her blood pressure becomes tenuous we'll give IV hydrocortisone. 7. Cecal ulcer, history of- maybe source of recurrent GI bleed. Defer further evaluation to GI.  Rynn Markiewicz,W DOUGLAS 05/13/2011, 12:45 PM

## 2011-05-13 NOTE — Consult Note (Signed)
I have reviewed the above note, examined the patient and agree with plan of treatment.Low volume LGIB  With no change in H/H since admission in 12/2010. I suspect self-limited diverticular bleed,  she has not passed any blood x12 hours now.She is c/o. LMQ abd. pain c/w symptomatic diverticulosis. Would try Bentyl 10 mg po tid ac. If she remains stable we will not plan to do  Colonoscopy (  although she "wants to know"). Remain on bowl rest, will check CBC in am.

## 2011-05-13 NOTE — ED Notes (Signed)
Pt BIB EMS. Pt woke up with a small amount of blood. States she had a blood clot the size of a nickel when she went to the [facility]. Pt states this happened before at Thanksgiving and Christmas. Pt reports she had to have a blood transfusion and surgery for rectal polyp when this occurred before. Pt states she feels bloated and weak at present. Denies pain.

## 2011-05-13 NOTE — ED Notes (Signed)
Family at bedside, vaseline given for dry lips. Hot tea given per clear liquid diet.

## 2011-05-13 NOTE — ED Notes (Signed)
[id number]> Expected date:<BR> Expected time:<BR> Means of arrival:<BR> Comments:<BR> EMS/elderly female/rectal bleed-small amount-VSS

## 2011-05-13 NOTE — ED Notes (Signed)
MD at bedside. Dr. Hosmer at bedside.  

## 2011-05-14 DIAGNOSIS — D62 Acute posthemorrhagic anemia: Secondary | ICD-10-CM

## 2011-05-14 LAB — CBC
Hemoglobin: 9.4 g/dL — ABNORMAL LOW (ref 12.0–15.0)
MCH: 26 pg (ref 26.0–34.0)
MCHC: 31.6 g/dL (ref 30.0–36.0)
MCV: 82.3 fL (ref 78.0–100.0)
RBC: 3.61 MIL/uL — ABNORMAL LOW (ref 3.87–5.11)

## 2011-05-14 LAB — COMPREHENSIVE METABOLIC PANEL
CO2: 23 mEq/L (ref 19–32)
Calcium: 8.9 mg/dL (ref 8.4–10.5)
Creatinine, Ser: 0.59 mg/dL (ref 0.50–1.10)
GFR calc Af Amer: >90 mL/min (ref >90)
GFR calc non Af Amer: >90 mL/min (ref >90)
Glucose, Bld: 72 mg/dL (ref 70–99)
Sodium: 141 mEq/L (ref 135–145)
Total Protein: 6 g/dL (ref 6.0–8.3)

## 2011-05-14 LAB — LIPASE, BLOOD: Lipase: 41 U/L (ref 11–59)

## 2011-05-14 MED ORDER — PANTOPRAZOLE SODIUM 40 MG PO TBEC
40.0000 mg | DELAYED_RELEASE_TABLET | Freq: Two times a day (BID) | ORAL | Status: DC
  Administered 2011-05-14 – 2011-05-15 (×3): 40 mg via ORAL
  Filled 2011-05-14 (×5): qty 1

## 2011-05-14 MED ORDER — PREDNISONE 20 MG PO TABS
20.0000 mg | ORAL_TABLET | Freq: Every day | ORAL | Status: DC
  Administered 2011-05-14 – 2011-05-15 (×2): 20 mg via ORAL
  Filled 2011-05-14 (×3): qty 1

## 2011-05-14 MED ORDER — PSYLLIUM 95 % PO PACK
1.0000 | PACK | Freq: Every day | ORAL | Status: DC
  Administered 2011-05-14 – 2011-05-15 (×2): 1 via ORAL
  Filled 2011-05-14 (×3): qty 1

## 2011-05-14 MED ORDER — DICYCLOMINE HCL 10 MG PO CAPS
10.0000 mg | ORAL_CAPSULE | Freq: Two times a day (BID) | ORAL | Status: DC
  Administered 2011-05-14 – 2011-05-15 (×3): 10 mg via ORAL
  Filled 2011-05-14 (×4): qty 1

## 2011-05-14 NOTE — Progress Notes (Signed)
Lab unable to get blood for hgb and hct ordered for today,tried by 2 lab techs. Dr Clelia Croft made aware.Cbc ordered for AM

## 2011-05-14 NOTE — Progress Notes (Signed)
Subjective: No BM since admission.  Some mild discomfort over left side of abdomen.    Objective: Vital signs in last 24 hours: Temp:  [87.5 F (30.8 C)-97.9 F (36.6 C)] 97.9 F (36.6 C) (05/01 0438) Pulse Rate:  [72-97] 97  (05/01 0438) Resp:  [16-20] 17  (05/01 0438) BP: (115-121)/(60-72) 116/69 mmHg (05/01 0438) SpO2:  [95 %-100 %] 95 % (05/01 0438) Weight:  [83.7 kg (184 lb 8.4 oz)] 83.7 kg (184 lb 8.4 oz) (04/30 1343) Weight change:  Last BM Date: 05/12/11  CBG (last 3)  No results found for this basename: GLUCAP:3 in the last 72 hours  Intake/Output from previous day: 04/30 0701 - 05/01 0700 In: 316.7 [P.O.:250; I.V.:66.7] Out: -  Intake/Output this shift:    General appearance: alert and no distress Eyes: no scleral icterus Throat: oropharynx moist without erythema Resp: clear to auscultation bilaterally Cardio: regular rate and rhythm, S1, S2 normal, no murmur, click, rub or gallop GI: soft, minimal left sided tenderness; bowel sounds normal; no masses,  no organomegaly; no rebound or guarding Extremities: no clubbing, cyanosis or edema Neurologic: diffuse proximal weakness   Lab Results:  [facility] 05/14/11 0353 05/13/11 0815  NA 141 141  K 3.5 3.7  CL 106 102  CO2 23 25  GLUCOSE 72 69*  BUN 17 33*  CREATININE 0.59 0.96  CALCIUM 8.9 9.4  MG -- --  PHOS -- --    Basename 05/14/11 0353  AST 24  ALT 21  ALKPHOS 55  BILITOT 0.3  PROT 6.0  ALBUMIN 3.0*    Basename 05/14/11 0353 05/13/11 1943 05/13/11 0815  WBC 7.5 -- 8.6  NEUTROABS -- -- 5.2  HGB 9.4* 10.2* --  HCT 29.7* 31.9* --  MCV 82.3 -- 82.0  PLT 352 -- 353   Lab Results  Component Value Date   INR 0.93 05/13/2011   INR 0.91 01/07/2011   INR 0.96 12/04/2010   No results found for this basename: CKTOTAL:3,CKMB:3,CKMBINDEX:3,TROPONINI:3 in the last 72 hours No results found for this basename: TSH,T4TOTAL,FREET3,T3FREE,THYROIDAB in the last 72 hours No results found for this basename:  VITAMINB12:2,FOLATE:2,FERRITIN:2,TIBC:2,IRON:2,RETICCTPCT:2 in the last 72 hours  Studies/Results: No results found.   Medications: Scheduled:   . sodium chloride   Intravenous STAT  . sodium chloride   Intravenous Once  . dicyclomine  10 mg Oral BID  . folic acid  0.5 mg Oral Daily  . loratadine  10 mg Oral Daily  . pantoprazole (PROTONIX) IV  40 mg Intravenous Q24H  . predniSONE  40 mg Oral QAC breakfast  . psyllium  1 packet Oral Daily  . vitamin B-12  1,000 mcg Oral Daily  . white petrolatum      . DISCONTD: sodium chloride   Intravenous Once  . DISCONTD: folic acid  400 mcg Oral Daily  . DISCONTD: pantoprazole (PROTONIX) IV  40 mg Intravenous Q12H   Continuous:   Assessment/Plan: Principal Problem:  1. *Hematochezia- bright red blood per rectum is likely related to her known cecal ulcer/AVM vs. Diverticulosis.  No recurrent bleeding since admission and Hg relatively stable. Continue PPI twice a day (change to po). Continue supportive management with IV fluid hydration and frequent hemoglobin checks. Appreciate GI assistance- defer additional evaluation, if any is warranted to GI. Active Problems:  2. Acute blood loss anemia- relatively stable- Recheck this afternoon transfuse if hemoglobin drops below 8.0  3. HYPERTENSION- hold antihypertensive therapy to ensure that bleeding has stabilized.  4. GASTROESOPHAGEAL REFLUX DISEASE- continue PPI  twice a day.  5. Polymyositis- will begin to wean stress dose steroids. She is scheduled to follow up with rheumatology to adjust medications.  6. Adrenal insufficiency- decrease Prednisone to 20 mg daily. 7. Disposition- anticipate discharge tomorrow if Hg stable and no further GI evaluation recommended.  Advance diet.  Ambulate.   LOS: 1 day   Deunte Bledsoe,W DOUGLAS 05/14/2011, 7:53 AM

## 2011-05-14 NOTE — Progress Notes (Signed)
Subjective Had a good night, slept well, no stools, no bleeding, she is hungry  Objective: Vital signs in last 24 hours: Temp:  [87.5 F (30.8 C)-97.9 F (36.6 C)] 97.9 F (36.6 C) (05/01 0438) Pulse Rate:  [72-97] 97  (05/01 0438) Resp:  [16-20] 17  (05/01 0438) BP: (115-121)/(60-72) 116/69 mmHg (05/01 0438) SpO2:  [95 %-100 %] 95 % (05/01 0438) Weight:  [184 lb 8.4 oz (83.7 kg)] 184 lb 8.4 oz (83.7 kg) (04/30 1343) Last BM Date: 05/12/11 General:   Alert,  pleasant, cooperative in NAD Head:  Normocephalic and atraumatic. Eyes:  Sclera clear, no icterus.   Conjunctiva pink. Mouth:  No deformity or lesions, dentition normal. Neck:  Supple; no masses or thyromegaly. Heart:  Regular rate and rhythm; no murmurs, clicks, rubs,  or gallops. Lungs:  No wheezes or rales Abdomen:  Soft, relaxed, normal bowl sounds, no distention, minimal tenderness LLQ  Msk:  Symmetrical without gross deformities. Normal posture. Pulses:  Normal pulses noted. Extremities:  Without clubbing or edema. Neurologic:  Alert and  oriented x4;  grossly normal neurologically. Skin:  Intact without significant lesions or rashes.  Intake/Output from previous day: 04/30 0701 - 05/01 0700 In: 316.7 [P.O.:250; I.V.:66.7] Out: -  Intake/Output this shift:    Lab Results:  Basename 05/14/11 0353 05/13/11 1943 05/13/11 1510 05/13/11 0815  WBC 7.5 -- -- 8.6  HGB 9.4* 10.2* 9.7* --  HCT 29.7* 31.9* 30.5* --  PLT 352 -- -- 353   BMET  Basename 05/14/11 0353 05/13/11 0815  NA 141 141  K 3.5 3.7  CL 106 102  CO2 23 25  GLUCOSE 72 69*  BUN 17 33*  CREATININE 0.59 0.96  CALCIUM 8.9 9.4   LFT  Basename 05/14/11 0353  PROT 6.0  ALBUMIN 3.0*  AST 24  ALT 21  ALKPHOS 55  BILITOT 0.3  BILIDIR --  IBILI --   PT/INR  Basename 05/13/11 0815  LABPROT 12.7  INR 0.93   Hepatitis Panel No results found for this basename: HEPBSAG,HCVAB,HEPAIGM,HEPBIGM in the last 72 hours  Studies/Results: No results  found.   ASSESSMENT:   Principal Problem:  *Hematochezia Active Problems:  HYPERTENSION  GASTROESOPHAGEAL REFLUX DISEASE  Polymyositis  Adrenal insufficiency  Acute blood loss anemia  Cecal ulcer     PLAN:   Low volume hematochezia, Hgb 9.4, about the same, as per discussion with the patient I lean toward not pursuing colonoscopy this time. I have suggested Metamucil and Bentyl, also advance the diet,     LOS: 1 day   Lina Sar  05/14/2011, 7:38 AM

## 2011-05-15 LAB — CBC
HCT: 28.3 % — ABNORMAL LOW (ref 36.0–46.0)
Hemoglobin: 9.2 g/dL — ABNORMAL LOW (ref 12.0–15.0)
MCH: 26.4 pg (ref 26.0–34.0)
MCV: 81.1 fL (ref 78.0–100.0)
Platelets: 367 10*3/uL (ref 150–400)
RBC: 3.49 MIL/uL — ABNORMAL LOW (ref 3.87–5.11)

## 2011-05-15 MED ORDER — DICYCLOMINE HCL 10 MG PO CAPS: 10.0000 mg | ORAL_CAPSULE | Freq: Two times a day (BID) | ORAL | Status: DC

## 2011-05-15 MED ORDER — PSYLLIUM 95 % PO PACK: 1.0000 | PACK | Freq: Every day | ORAL | Status: DC

## 2011-05-15 MED ORDER — ATENOLOL 50 MG PO TABS
50.0000 mg | ORAL_TABLET | Freq: Every day | ORAL | Status: DC
  Administered 2011-05-15: 50 mg via ORAL
  Filled 2011-05-15: qty 1

## 2011-05-15 NOTE — Discharge Summary (Signed)
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 Lumbar laminectomy   . Tonsillectomy   . Bilateral  rotator cuff surgery   . Esophagogastroduodenoscopy 12/08/2010    Procedure: ESOPHAGOGASTRODUODENOSCOPY (EGD);  Surgeon: Hart Carwin, MD;  Location: [facility];  Service: Endoscopy;  Laterality: N/A;  . Abdominal hysterectomy     partial  . Colonoscopy 01/09/2011    Procedure: COLONOSCOPY;  Surgeon: Eliezer Bottom., MD,FACG;  Location: [facility];  Service: Endoscopy;  Laterality: N/A;    Discharge Medications: Medication List  As of 05/15/2011  6:54 AM   TAKE these medications         acetaminophen 500 MG tablet   Commonly known as: TYLENOL   Take 500 mg by mouth every 6 (six) hours as needed. For generic pain relief      ALPRAZolam 0.25 MG tablet   Commonly known as: XANAX   Take 0.25 mg by mouth 3 (three) times daily as needed. Anxiety and sleep      atenolol 50 MG tablet   Commonly known as: TENORMIN   Take 50 mg by mouth daily.      CALTRATE 600 1500 MG Tabs   Generic drug: Calcium Carbonate   Take 2 tablets by mouth daily. Daily        dicyclomine 10 MG capsule   Commonly  known as: BENTYL   Take 1 capsule (10 mg total) by mouth 2 (two) times daily.      ergocalciferol 50000 UNITS capsule   Commonly known as: VITAMIN D2   Take 50,000 Units by mouth 2 (two) times a week. Takes 1 tablet every Monday and friday      esomeprazole 40 MG capsule   Commonly known as: NEXIUM   Take 40 mg by mouth 2 (two) times daily.      folic acid 400 MCG tablet   Commonly known as: FOLVITE   Take 400 mcg by mouth daily.      furosemide 40 MG tablet   Commonly known as: LASIX   Take 40 mg by mouth daily.      loratadine 10 MG tablet   Commonly known as: CLARITIN   Take 10 mg by mouth daily.      potassium chloride SA 20 MEQ tablet   Commonly known as: K-DUR,KLOR-CON   Take 40 mEq by mouth daily.      predniSONE 10 MG tablet   Commonly known as: DELTASONE   Take 1 tablet (10 mg total) by mouth daily. Take 2 pills per day for 2 days, then resume 1 pill daily      psyllium 95 % Pack   Commonly known as: HYDROCIL/METAMUCIL   Take 1 packet by mouth daily.      sodium chloride 0.65 % nasal spray   Commonly known as:  OCEAN   Place 1 spray into the nose as needed.      vitamin B-12 1000 MCG tablet   Commonly known as: CYANOCOBALAMIN   Take 1,000 mcg by mouth daily.      vitamin C 1000 MG tablet   Take 1,000 mg by mouth daily.      vitamin E 600 UNIT capsule   Take 600 Units by mouth daily.            Hospital Procedures: No results found.  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 She was hospitalized 11/12 and 12/12 for bright red blood per rectum. 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 This was treated supportively with no recurrent bleeding since that time. She has been followed in the GI clinic for the past several years for her history of colon polyps and gastric mucosal lesion in 2008. She saw Dr. Christella Hartigan most recently in 2/13. She states she developed increased bloating and gas over the past 2-3 weeks. She called Dr. Larae Grooms office and they recommended increasing her Nexium to twice a day. She noticed a little bit of improvement with this. Otherwise, she has not had any abdominal pain or change in her bowel movements until this morning. When she awoke this morning around 4 AM she noted blood on her sheets. She proceeded to the bathroom where she had a large bloody bowel movement with a bright red blood clot. She states that this was very similar to the appearance of her stool in 11/12 and 12/12. She denies any NSAID or aspirin use. She remains on prednisone for polymyositis. She felt a little weak this morning but denies any lightheadedness, chest pain, or increasing weakness at this point. Given her symptoms she came to the emergency department where her hemoglobin was 10.2 with a BUN and creatinine of 33 and 0.96 respectively. She is being admitted for further management.   Hospital Course: Ms. Tool was admitted to a medical bed. She was monitored closely with frequent hemoglobin checks and monitoring of her stool output. She had a slight decrease in her hemoglobin from 10.2 on admission to 9.2 on the day of discharge. This remained stable for the past 24 hours with no further bloody bowel movements after admission. In fact, she has not had a bowel movement in over 24 hours. Gastroenterology was consulted and felt that this was a self-limited bleed, likely diverticular in nature. They did not feel that further evaluation with endoscopy or colonoscopy was necessary given the low volume of bright red blood per rectum and  stable hemoglobin.  She was continued on PPI twice a day which she recently started at home for increased bloating and epigastric discomfort.  With this, her symptoms are stable. She was also given stress dose steroids on admission and her antihypertensive therapy was held. Her beta blockers being resume this morning due to mild tachycardia and stable blood pressure readings. At this point, given her stable hemoglobin and lack of recurrent blood in stools, she is stable for discharge home.  Day of Discharge Exam BP 137/72  Pulse 100  Temp(Src)  97.8 F (36.6 C) (Oral)  Resp 17  Ht 5\' 3"  (1.6 m)  Wt 83.7 kg (184 lb 8.4 oz)  BMI 32.69 kg/m2  SpO2 97%  Physical Exam: General appearance: alert and no distress Eyes: no scleral icterus Throat: oropharynx moist without erythema Resp: clear to auscultation bilaterally Cardio: regular rate and rhythm, S1, S2 normal, no murmur, click, rub or gallop GI: soft, minimal left upper quadrant tenderness improved; bowel sounds normal; no masses,  no organomegaly Extremities: no clubbing, cyanosis or edema  Discharge Labs:  [facility] 05/14/11 0353 05/13/11 0815  NA 141 141  K 3.5 3.7  CL 106 102  CO2 23 25  GLUCOSE 72 69*  BUN 17 33*  CREATININE 0.59 0.96  CALCIUM 8.9 9.4  MG -- --  PHOS -- --    Basename 05/14/11 0353  AST 24  ALT 21  ALKPHOS 55  BILITOT 0.3  PROT 6.0  ALBUMIN 3.0*    Basename 05/15/11 0355 05/14/11 0353 05/13/11 0815  WBC 10.0 7.5 --  NEUTROABS -- -- 5.2  HGB 9.2* 9.4* --  HCT 28.3* 29.7* --  MCV 81.1 82.3 --  PLT 367 352 --   Lab Results  Component Value Date   INR 0.93 05/13/2011   INR 0.91 01/07/2011   INR 0.96 12/04/2010    Discharge instructions: Discharge Orders    Future Orders Please Complete By Expires   Diet - low sodium heart healthy      Increase activity slowly      Discharge instructions      Comments:   Call if bleeding returns or if you develop increased abdominal pain. Followup with  Dr. Dareen Piano regarding your polymyositis medications. Avoid all aspirin products and NSAIDs.      Disposition: To home  Follow-up Appts: Follow-up with Dr. Clelia Croft at [facility] in 1 week.  Condition on Discharge: Improved  Tests Needing Follow-up: Repeat CBC at followup visit  Signed: Shenia Alan,W DOUGLAS 05/15/2011, 6:54 AM

## 2011-05-15 NOTE — Progress Notes (Signed)
Pt's heart rate range from 118- 100 over night. Pt denies feeling any discomfort. Other vital signs remain stable. Will cont to monitor. [name] [name]

## 2011-05-15 NOTE — Discharge Instructions (Signed)
Gastrointestinal Bleeding Gastrointestinal (GI) bleeding is bleeding from the gut or any place between your mouth and anus. If bleeding is slow, you may be allowed to go home. If there is a lot of bleeding, hospitalization and observation are often required. SYMPTOMS   You vomit bright red blood or material that looks like coffee grounds.   You have blood in your stools or the stools look black and tarry.  DIAGNOSIS  Your caregiver may diagnose your condition by taking a history and a physical exam. More tests may be needed, including:  X-rays.   EGD (esophagogastroduodenoscopy), which looks at your esophagus, stomach, and small bowel through a flexible telescope-like instrument.   Colonoscopy, which looks at your colon/large bowel through a flexible telescope-like instrument.   Biopsies, which remove a small sample of tissue to examine under a microscope.  Finding out the results of your test Not all test results are available during your visit. If your test results are not back during the visit, make an appointment with your caregiver to find out the results. Do not assume everything is normal if you have not heard from your caregiver or the medical facility. It is important for you to follow up on all of your test results. HOME CARE INSTRUCTIONS   Follow instructions as suggested by your caregiver regarding medicines. Do not take aspirin, drink alcohol, or take medicines for pain and arthritis unless your caregiver says it is okay.   Get the suggested follow-up care when the tests are done.  SEEK IMMEDIATE MEDICAL CARE IF:   Your bleeding increases or you become lightheaded, weak, or pass out (faint).   You experience severe cramps in your stomach, back, or belly (abdomen).   You pass large clots.   The problems which brought you in for medical care get worse.  MAKE SURE YOU:   Understand these instructions.   Will watch your condition.   Will get help right away if you are  not doing well or get worse.  Document Released: 12/28/1999 Document Revised: 12/19/2010 Document Reviewed: 12/09/2010 [employer] Patient Information 2012 [employer]. 

## 2011-05-27 ENCOUNTER — Inpatient Hospital Stay (HOSPITAL_COMMUNITY): Payer: Medicare Other

## 2011-05-27 ENCOUNTER — Inpatient Hospital Stay (HOSPITAL_COMMUNITY)
Admission: EM | Admit: 2011-05-27 | Discharge: 2011-05-31 | DRG: 378 | Disposition: A | Discharge status: Home / Self Care | Payer: Medicare Other | Attending: Internal Medicine | Admitting: Internal Medicine

## 2011-05-27 ENCOUNTER — Encounter (HOSPITAL_COMMUNITY): Payer: Self-pay

## 2011-05-27 DIAGNOSIS — E2749 Other adrenocortical insufficiency: Secondary | ICD-10-CM | POA: Diagnosis present

## 2011-05-27 DIAGNOSIS — K921 Melena: Secondary | ICD-10-CM

## 2011-05-27 DIAGNOSIS — K5521 Angiodysplasia of colon with hemorrhage: Principal | ICD-10-CM | POA: Diagnosis present

## 2011-05-27 DIAGNOSIS — D126 Benign neoplasm of colon, unspecified: Secondary | ICD-10-CM | POA: Diagnosis present

## 2011-05-27 DIAGNOSIS — K633 Ulcer of intestine: Secondary | ICD-10-CM | POA: Diagnosis present

## 2011-05-27 DIAGNOSIS — IMO0002 Reserved for concepts with insufficient information to code with codable children: Secondary | ICD-10-CM

## 2011-05-27 DIAGNOSIS — R1032 Left lower quadrant pain: Secondary | ICD-10-CM | POA: Diagnosis present

## 2011-05-27 DIAGNOSIS — I10 Essential (primary) hypertension: Secondary | ICD-10-CM | POA: Diagnosis present

## 2011-05-27 DIAGNOSIS — K922 Gastrointestinal hemorrhage, unspecified: Secondary | ICD-10-CM

## 2011-05-27 DIAGNOSIS — M3320 Polymyositis, organ involvement unspecified: Secondary | ICD-10-CM | POA: Diagnosis present

## 2011-05-27 DIAGNOSIS — E2740 Unspecified adrenocortical insufficiency: Secondary | ICD-10-CM | POA: Diagnosis present

## 2011-05-27 DIAGNOSIS — D62 Acute posthemorrhagic anemia: Secondary | ICD-10-CM | POA: Diagnosis present

## 2011-05-27 HISTORY — DX: Gastrointestinal hemorrhage, unspecified: K92.2

## 2011-05-27 HISTORY — DX: Benign neoplasm of connective and other soft tissue of abdomen: D21.4

## 2011-05-27 LAB — DIFFERENTIAL
Basophils Absolute: 0 10*3/uL (ref 0.0–0.1)
Basophils Relative: 0 % (ref 0–1)
Eosinophils Absolute: 0 10*3/uL (ref 0.0–0.7)
Eosinophils Relative: 0 % (ref 0–5)
Monocytes Absolute: 0.6 10*3/uL (ref 0.1–1.0)
Monocytes Relative: 5 % (ref 3–12)

## 2011-05-27 LAB — COMPREHENSIVE METABOLIC PANEL
AST: 32 U/L (ref 0–37)
Albumin: 3.4 g/dL — ABNORMAL LOW (ref 3.5–5.2)
BUN: 26 mg/dL — ABNORMAL HIGH (ref 6–23)
Calcium: 8.9 mg/dL (ref 8.4–10.5)
Creatinine, Ser: 0.61 mg/dL (ref 0.50–1.10)

## 2011-05-27 LAB — CBC
HCT: 28.6 % — ABNORMAL LOW (ref 36.0–46.0)
MCH: 26.5 pg (ref 26.0–34.0)
MCHC: 31.8 g/dL (ref 30.0–36.0)
MCV: 83.1 fL (ref 78.0–100.0)
RDW: 18.5 % — ABNORMAL HIGH (ref 11.5–15.5)

## 2011-05-27 LAB — PROTIME-INR: Prothrombin Time: 13.2 seconds (ref 11.6–15.2)

## 2011-05-27 LAB — HEMOGLOBIN AND HEMATOCRIT, BLOOD: Hemoglobin: 9.1 g/dL — ABNORMAL LOW (ref 12.0–15.0)

## 2011-05-27 LAB — APTT: aPTT: 31 seconds (ref 24–37)

## 2011-05-27 MED ORDER — PANTOPRAZOLE SODIUM 40 MG PO TBEC
40.0000 mg | DELAYED_RELEASE_TABLET | Freq: Two times a day (BID) | ORAL | Status: DC
  Administered 2011-05-28 – 2011-05-31 (×7): 40 mg via ORAL
  Filled 2011-05-27 (×10): qty 1

## 2011-05-27 MED ORDER — PSYLLIUM 95 % PO PACK
1.0000 | PACK | Freq: Every day | ORAL | Status: DC
  Administered 2011-05-28 – 2011-05-31 (×4): 1 via ORAL
  Filled 2011-05-27 (×4): qty 1

## 2011-05-27 MED ORDER — PREDNISONE 20 MG PO TABS
40.0000 mg | ORAL_TABLET | Freq: Every day | ORAL | Status: DC
  Filled 2011-05-27 (×2): qty 2

## 2011-05-27 MED ORDER — TECHNETIUM TC 99M-LABELED RED BLOOD CELLS IV KIT
23.7000 | PACK | Freq: Once | INTRAVENOUS | Status: AC | PRN
  Administered 2011-05-27: 24 via INTRAVENOUS

## 2011-05-27 MED ORDER — ACETAMINOPHEN 500 MG PO TABS
500.0000 mg | ORAL_TABLET | Freq: Four times a day (QID) | ORAL | Status: DC | PRN
  Administered 2011-05-27 – 2011-05-30 (×3): 500 mg via ORAL
  Filled 2011-05-27 (×3): qty 1

## 2011-05-27 MED ORDER — DICYCLOMINE HCL 10 MG PO CAPS
10.0000 mg | ORAL_CAPSULE | Freq: Two times a day (BID) | ORAL | Status: DC
  Administered 2011-05-27 – 2011-05-31 (×8): 10 mg via ORAL
  Filled 2011-05-27 (×10): qty 1

## 2011-05-27 MED ORDER — SODIUM CHLORIDE 0.9 % IV SOLN
Freq: Once | INTRAVENOUS | Status: AC
  Administered 2011-05-30: 09:00:00 via INTRAVENOUS

## 2011-05-27 MED ORDER — LORATADINE 10 MG PO TABS
10.0000 mg | ORAL_TABLET | Freq: Every day | ORAL | Status: DC
  Administered 2011-05-28 – 2011-05-31 (×4): 10 mg via ORAL
  Filled 2011-05-27 (×4): qty 1

## 2011-05-27 MED ORDER — SALINE NASAL SPRAY 0.65 % NA SOLN
1.0000 | NASAL | Status: DC | PRN
  Filled 2011-05-27: qty 0.5

## 2011-05-27 MED ORDER — SODIUM CHLORIDE 0.9 % IV SOLN
INTRAVENOUS | Status: DC
  Administered 2011-05-27 – 2011-05-30 (×3): via INTRAVENOUS

## 2011-05-27 MED ORDER — FOLIC ACID 400 MCG PO TABS: 400.0000 ug | ORAL_TABLET | Freq: Every day | ORAL | Status: DC

## 2011-05-27 MED ORDER — FOLIC ACID 0.5 MG HALF TAB
0.5000 mg | ORAL_TABLET | Freq: Every day | ORAL | Status: DC
  Administered 2011-05-28 – 2011-05-31 (×4): 0.5 mg via ORAL
  Filled 2011-05-27 (×4): qty 1

## 2011-05-27 MED ORDER — ALPRAZOLAM 0.25 MG PO TABS
0.2500 mg | ORAL_TABLET | Freq: Three times a day (TID) | ORAL | Status: DC | PRN
  Administered 2011-05-27 – 2011-05-31 (×5): 0.25 mg via ORAL
  Filled 2011-05-27 (×5): qty 1

## 2011-05-27 NOTE — ED Provider Notes (Signed)
History     CSN: [id number]  Arrival date & time 05/27/11  1256   First MD Initiated Contact with Patient 05/27/11 1328      Chief Complaint  Patient presents with  . Rectal Bleeding    (Consider location/radiation/quality/duration/timing/severity/associated sxs/prior treatment) Patient is a 71 y.o. female presenting with hematochezia. The history is provided by the patient.  Rectal Bleeding  The current episode started today. The onset was sudden. The problem occurs continuously. The problem has been gradually worsening. The pain is moderate. The stool is described as bloody. Associated symptoms include abdominal pain and nausea. Pertinent negatives include no fever, no hemorrhoids and no vomiting.  h/o colonic polyps and diverticulosis with recurrent gi bleeds--spoke to her pcp and sent here for admission  Past Medical History  Diagnosis Date  . Benign neoplasm of colon   . Esophageal reflux   . Polymyositis   . Allergic rhinitis, cause unspecified   . Unspecified essential hypertension   . Hyperlipidemia   . Osteoporosis   . Vitamin d deficiency     Past Surgical History  Procedure Date  . Lumbar laminectomy   . Tonsillectomy   . Bilateral  rotator cuff surgery   . Esophagogastroduodenoscopy 12/08/2010    Procedure: ESOPHAGOGASTRODUODENOSCOPY (EGD);  Surgeon: Hart Carwin, MD;  Location: [facility];  Service: Endoscopy;  Laterality: N/A;  . Abdominal hysterectomy     partial  . Colonoscopy 01/09/2011    Procedure: COLONOSCOPY;  Surgeon: Eliezer Bottom., MD,FACG;  Location: [facility];  Service: Endoscopy;  Laterality: N/A;    Family History  Problem Relation Age of Onset  . Dementia Mother   . Hypertension Father   . Malignant hyperthermia Father     History  Substance Use Topics  . Smoking status: Never Smoker   . Smokeless tobacco: Current User    Types: Snuff  . Alcohol Use: No    OB History    Grav Para Term Preterm Abortions TAB SAB Ect  Mult Living                  Review of Systems  Constitutional: Negative for fever.  Gastrointestinal: Positive for nausea, abdominal pain and hematochezia. Negative for vomiting and hemorrhoids.  All other systems reviewed and are negative.    Allergies  Codeine  Home Medications   Current Outpatient Rx  Name Route Sig Dispense Refill  . ACETAMINOPHEN 500 MG PO TABS Oral Take 500 mg by mouth every 6 (six) hours as needed. For generic pain relief     . ALPRAZOLAM 0.25 MG PO TABS Oral Take 0.25 mg by mouth 3 (three) times daily as needed. Anxiety and sleep    . VITAMIN C 1000 MG PO TABS Oral Take 1,000 mg by mouth daily.      . ATENOLOL 50 MG PO TABS Oral Take 50 mg by mouth daily.      Marland Kitchen CALCIUM CARBONATE 1500 MG PO TABS Oral Take 2 tablets by mouth daily. Daily     . DICYCLOMINE HCL 10 MG PO CAPS Oral Take 1 capsule (10 mg total) by mouth 2 (two) times daily. 60 capsule 3  . ERGOCALCIFEROL 50000 UNITS PO CAPS Oral Take 50,000 Units by mouth 2 (two) times a week. Takes 1 tablet every Monday and friday    . ESOMEPRAZOLE MAGNESIUM 40 MG PO CPDR Oral Take 40 mg by mouth 2 (two) times daily.     Marland Kitchen FOLIC ACID 400 MCG PO TABS Oral Take  400 mcg by mouth daily.      . FUROSEMIDE 40 MG PO TABS Oral Take 40 mg by mouth daily.      Marland Kitchen LORATADINE 10 MG PO TABS Oral Take 10 mg by mouth daily.    Marland Kitchen POTASSIUM CHLORIDE CRYS ER 20 MEQ PO TBCR Oral Take 40 mEq by mouth daily.     Marland Kitchen PREDNISONE 10 MG PO TABS Oral Take 1 tablet (10 mg total) by mouth daily. Take 2 pills per day for 2 days, then resume 1 pill daily 34 tablet 5  . PSYLLIUM 95 % PO PACK Oral Take 1 packet by mouth daily. 56 each 6  . SALINE NASAL SPRAY 0.65 % NA SOLN Nasal Place 1 spray into the nose as needed.    Marland Kitchen VITAMIN B-12 1000 MCG PO TABS Oral Take 1,000 mcg by mouth daily.      Marland Kitchen VITAMIN E 600 UNITS PO CAPS Oral Take 600 Units by mouth daily.        BP 137/67  Pulse 85  Temp 98 F (36.7 C)  Resp 18  SpO2 98%  Physical  Exam  Nursing note and vitals reviewed. Constitutional: She is oriented to person, place, and time. Vital signs are normal. She appears well-developed and well-nourished.  Non-toxic appearance. No distress.  HENT:  Head: Normocephalic and atraumatic.  Eyes: Conjunctivae, EOM and lids are normal. Pupils are equal, round, and reactive to light.  Neck: Normal range of motion. Neck supple. No tracheal deviation present. No mass present.  Cardiovascular: Normal rate, regular rhythm and normal heart sounds.  Exam reveals no gallop.   No murmur heard. Pulmonary/Chest: Effort normal and breath sounds normal. No stridor. No respiratory distress. She has no decreased breath sounds. She has no wheezes. She has no rhonchi. She has no rales.  Abdominal: Soft. Normal appearance and bowel sounds are normal. She exhibits no distension. There is no tenderness. There is no rebound and no CVA tenderness.  Genitourinary:       Gross blood per rectum  Musculoskeletal: Normal range of motion. She exhibits no edema and no tenderness.  Neurological: She is alert and oriented to person, place, and time. She has normal strength. No cranial nerve deficit or sensory deficit. GCS eye subscore is 4. GCS verbal subscore is 5. GCS motor subscore is 6.  Skin: Skin is warm and dry. No abrasion and no rash noted.  Psychiatric: She has a normal mood and affect. Her speech is normal and behavior is normal.    ED Course  Procedures (including critical care time)   Date: 05/27/2011  Rate: 76   Rhythm: normal sinus rhythm  QRS Axis: normal  Intervals: normal  ST/T Wave abnormalities: normal  Conduction Disutrbances:none  Narrative Interpretation:   Old EKG Reviewed: unchanged    Labs Reviewed  CBC  DIFFERENTIAL  COMPREHENSIVE METABOLIC PANEL  TYPE AND SCREEN  PROTIME-INR  APTT   No results found.   1. Lower GI bleed       MDM  Spoke with dr. Clelia Croft, pt to be admitted        Toy Baker,  MD 05/27/11 1353

## 2011-05-27 NOTE — ED Notes (Signed)
Attempted to call report to [facility] RN

## 2011-05-27 NOTE — ED Notes (Signed)
Per ems: pt c/o of rectal bleeding. Pt c/o cramping before rectal bleeding it happened around 11:30am and diarrhea .

## 2011-05-27 NOTE — ED Notes (Signed)
2 unsuccessful attempts at iv start

## 2011-05-27 NOTE — H&P (Signed)
PCP:   Kari Baars, MD, MD   Chief Complaint:  Blood in stools  HPI: Is a 71 year old African American female with a history of recurrent lower GI bleed secondary to a cecal ulcer (12/12) and possible diverticulosis, as well as a history of an upper GI bleed secondary to GI ST (8/08) who presented to the emergency department with recurrent right red blood per rectum. Patient has been hospitalized 11/12, 12/12, and recently from 4/30-05/15/11 for recurrent hematochezia. In 12/12 she was found have a cecal ulcer. She did require transfusion at that time. She had no additional bleeding until her recent hospitalization. That episode was characterized by minimal bright red blood parenchyma that resolved promptly. She was evaluating by gastroenterology during that hospitalization who did not recommend additional workup at that time. It was felt that this may been diverticular in nature. Patient returned home and has been doing well until this morning when she awoke at 4:30. She first noted a little blood in her bowel movement shortly after awakening. About 2 hours later after eating she had some lower, cramping followed by a large amount of bright red blood per rectum. She called her office and was referred to the emergency department. Since arrival in the emergency Department she's had one additional episode of bright red blood per rectum.  In the past, she has not had the degree of abdominal cramping she currently has. She has had a persistent left upper quadrant abdominal pain for the past several months it waxes and wanes but is not very severe.    Review of Systems:  Review of Systems - all systems reviewed and are negative except in history of present illness. Generalized proximal muscle weakness is unchanged.  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  Lumbar laminectomy    .  Tonsillectomy    .  Bilateral rotator cuff surgery    .  Esophagogastroduodenoscopy  12/08/2010     Procedure: ESOPHAGOGASTRODUODENOSCOPY (EGD); Surgeon: Hart Carwin, MD; Location: [facility]; Service: Endoscopy; Laterality: N/A;   .  Abdominal hysterectomy      partial   .  Colonoscopy  01/09/2011     Procedure: COLONOSCOPY; Surgeon: Eliezer Bottom., MD,FACG; Location: [facility]; Service: Endoscopy; Laterality: N/A;      Medications: Prior to Admission medications   Medication Sig Start Date End Date Taking? Authorizing Provider  acetaminophen (TYLENOL) 500 MG tablet Take 1,000 mg by mouth every 6 (six) hours as needed. For pain.   Yes Historical Provider, MD  ALPRAZolam (XANAX) 0.25 MG tablet Take 0.25 mg by mouth 3 (three) times daily as needed. Anxiety and sleep   Yes Historical Provider, MD  Ascorbic Acid (VITAMIN C) 1000 MG tablet Take 1,000 mg by mouth daily.    Yes Historical Provider, MD  atenolol (TENORMIN) 50 MG tablet Take 50 mg by mouth daily.     Yes Historical Provider, MD  Calcium Carbonate (CALTRATE 600) 1500 MG TABS Take 2 tablets by mouth daily. Daily    Yes Historical Provider, MD  dicyclomine (BENTYL) 10 MG capsule Take 1 capsule (10 mg total) by mouth 2 (two) times daily. 05/15/11  05/14/12 Yes W Buren Kos, MD  ergocalciferol (VITAMIN D2) 50000 UNITS capsule Take 50,000 Units by mouth 2 (two) times a week. Takes 1 tablet every Monday and friday   Yes Historical Provider, MD  esomeprazole (NEXIUM) 40 MG capsule Take 40 mg by mouth 2 (two) times daily.    Yes Historical Provider, MD  folic acid (FOLVITE) 400 MCG tablet Take 400 mcg by mouth daily.     Yes Historical Provider, MD    furosemide (LASIX) 40 MG tablet Take 40 mg by mouth daily.     Yes Historical Provider, MD  loratadine (CLARITIN) 10 MG tablet Take 10 mg by mouth daily as needed. For allergies.   Yes Historical Provider, MD  potassium chloride SA (K-DUR,KLOR-CON) 20 MEQ tablet Take 40 mEq by mouth daily.    Yes Historical Provider, MD  predniSONE (DELTASONE) 10 MG tablet Take 10 mg by mouth daily.   Yes Historical Provider, MD  psyllium (HYDROCIL/METAMUCIL) 95 % PACK Take 1 packet by mouth daily. 05/15/11  Yes W Buren Kos, MD  sodium chloride (OCEAN) 0.65 % nasal spray Place 1 spray into the nose as needed. For allergies.   Yes Historical Provider, MD  vitamin B-12 (CYANOCOBALAMIN) 1000 MCG tablet Take 1,000 mcg by mouth daily.     Yes Historical Provider, MD  vitamin E 600 UNIT capsule Take 600 Units by mouth daily.     Yes Historical Provider, MD    Allergies:   Allergies  Allergen Reactions  . Codeine Nausea And Vomiting    Feels drunk   Social History:  "Kally"  separated. 5 children (2 in [place]), son killed gsw  11th grade education. disabled due to back pain  No tobacco, ETOH, drug use   Family History:  F- HTN, CVA  M-AD, CVA  sis, 2 bros- healthy  bro- d seizure  bro- ETOHism, Cirrhosis  children healthy   Physical Exam: Filed Vitals:   05/27/11 1313  BP: 137/67  Pulse: 85  Temp: 98 F (36.7 C)  Resp: 18  SpO2: 98%   General appearance: alert and no distress Head: Normocephalic, without obvious abnormality, atraumatic Eyes: conjunctivae/corneas clear. PERRL, EOM's intact.  Nose: Nares normal. Septum midline. Mucosa normal. No drainage or sinus tenderness. Throat: lips, mucosa, and tongue normal; teeth and gums normal Neck: no adenopathy, no carotid bruit, no JVD and thyroid not enlarged, symmetric, no tenderness/mass/nodules Resp: clear to auscultation bilaterally Cardio: regular rate and rhythm GI: soft, nondistended; bowel sounds normal; no masses,  no organomegaly;  tenderness in the left lower quadrant without rebound or guarding. No epigastric tenderness Extremities: extremities normal, atraumatic, no cyanosis or edema Pulses: 2+ and symmetric Lymph nodes: Cervical adenopathy: no cervical lymphadenopathy Neurologic: Alert and oriented X 3, proximal muscle weakness, chronic.     Labs on Admission:   [facility] 05/27/11 1330  NA 141  K 4.6  CL 103  CO2 26  GLUCOSE 114*  BUN 26*  CREATININE 0.61  CALCIUM 8.9  MG --  PHOS --    Basename 05/27/11 1330  AST 32  ALT 26  ALKPHOS 60  BILITOT 0.2*  PROT 6.7  ALBUMIN 3.4*     Basename 05/27/11 1330  WBC 11.2*  NEUTROABS 9.5*  HGB 9.1*  HCT 28.6*  MCV 83.1  PLT 372    Lab Results  Component Value Date   INR 0.98 05/27/2011   INR 0.93 05/13/2011   INR 0.91 01/07/2011   No results found for this basename: TSH,T4TOTAL,FREET3,T3FREE,THYROIDAB in  the last 72 hours No results found for this basename: VITAMINB12:2,FOLATE:2,FERRITIN:2,TIBC:2,IRON:2,RETICCTPCT:2 in the last 72 hours  Radiological Exams on Admission: No results found. Orders placed during the hospital encounter of 05/27/11  . EKG 12-LEAD  . EKG 12-LEAD    Assessment/Plan Principal Problem:  1. *Hematochezia- recurrent bright red blood per rectum is likely a lower GI source related to her known cecal ulcer/AVM versus diverticulosis. Given the current recurrent nature of this issue, I have ordered a tagged red cell scan to attempt to isolate the source of bleeding. She is awaiting a PICC line for IV access to have this completed as attempts at peripheral IVs have been unsuccessful.  Gastroenterology has been counsulted to direct further care. This may become a surgical issue. Will monitor hemoglobin and transfuse if less than 8.0. Currently she is hemodynamically stable without symptoms of anemia.  Active Problems:  2. Acute blood loss anemia- we'll monitor serial hemoglobin checks and transfuse if hemoglobin drops below 8.0   3. HYPERTENSION- hold antihypertensive therapy until bleeding is stabilized.  4. GASTROESOPHAGEAL REFLUX DISEASE- continue PPI twice a day.  5. Polymyositis- will provide stress dose steroids. She is scheduled to follow up with rheumatology to adjust medications.  6. Adrenal insufficiency- increase prednisone to 40 mg daily for stress dose. Her blood pressure becomes tenuous we'll give IV hydrocortisone.  7. Cecal ulcer, history of- maybe source of recurrent GI bleed. Defer further evaluation to GI.    Punam Broussard,W DOUGLAS 05/27/2011, 5:44 PM

## 2011-05-27 NOTE — Consult Note (Addendum)
[name] Consultation  Referring Provider: Eric Form, MD Primary Care Physician:  Kari Baars, MD, MD Primary Gastroenterologist:   Rob Bunting, MD Reason for Consultation:  GI bleed.  HPI: [name] [name] is a 71 y.o. female known to [place] for a history of erosive gastritis, tubulovillous adenomatous colon polyps, ? GIST and recurrent admissions over the last several months for gastrointestinal bleeding of unclear etiology. In November 2012 a bleeding scan was negative as was her EGD. colononscopy not done at that time as patient had had one within last couple of years. In December 2012 she had recurrent bleeding, colonoscopy was done and revealed a cecal ulcer (with ? ulcerated AVM). Patient readmitted late last month with low volume hematochezia, no endoscopies were done, no transfusions required.  Patient's PCP called [place] today, he is admitting patient for recurrent hematochezia. In ED her hemoglobin is 9.1, it was 9.2 on 05/15/11. She is hemodynamically stable. Denies dizziness, shortness of breath. The bleeding started around 10am, it was preceded by lower abdominal pain. She just had her third episode of bleeding today.    Past Medical History  Diagnosis Date  . Benign neoplasm of colon   . Esophageal reflux   . Polymyositis   . Allergic rhinitis, cause unspecified   . Unspecified essential hypertension   . Hyperlipidemia   . Osteoporosis   . 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 Lumbar laminectomy   . Tonsillectomy   . Bilateral  rotator cuff surgery   . Esophagogastroduodenoscopy 12/08/2010    Procedure: ESOPHAGOGASTRODUODENOSCOPY (EGD);  Surgeon: Hart Carwin, MD;  Location: [facility];  Service: Endoscopy;  Laterality: N/A;  . Abdominal hysterectomy     partial  . Colonoscopy 01/09/2011    Procedure: COLONOSCOPY;  Surgeon: Eliezer Bottom., MD,FACG;  Location: [facility];  Service: Endoscopy;  Laterality: N/A;    Prior to Admission medications   Medication Sig Start Date End Date Taking? Authorizing Provider  acetaminophen (TYLENOL) 500 MG tablet Take 1,000 mg by mouth every 6 (six) hours as needed. For pain.   Yes Historical Provider, MD  ALPRAZolam (XANAX) 0.25 MG tablet Take 0.25 mg by mouth 3 (three) times daily as needed. Anxiety and sleep   Yes Historical Provider, MD  Ascorbic Acid (VITAMIN C) 1000 MG tablet Take 1,000 mg by mouth daily.    Yes Historical Provider, MD  atenolol (TENORMIN) 50 MG tablet Take 50 mg by mouth daily.     Yes Historical Provider, MD  Calcium Carbonate (CALTRATE 600) 1500 MG TABS Take 2 tablets by mouth daily. Daily    Yes Historical Provider, MD  dicyclomine (BENTYL) 10 MG capsule Take 1 capsule (10 mg total) by mouth 2 (two) times daily. 05/15/11 05/14/12 Yes W Buren Kos, MD  ergocalciferol (VITAMIN D2) 50000 UNITS capsule Take 50,000 Units by mouth 2 (two) times a week. Takes 1 tablet every Monday and friday   Yes Historical Provider, MD  esomeprazole (NEXIUM) 40 MG capsule Take 40 mg by mouth 2 (two) times daily.    Yes Historical Provider, MD  folic acid (FOLVITE) 400 MCG tablet Take 400 mcg by mouth daily.     Yes Historical Provider, MD  furosemide (LASIX) 40 MG tablet Take 40 mg by mouth daily.     Yes Historical Provider, MD  loratadine (CLARITIN) 10 MG tablet Take 10 mg by mouth daily as needed. For allergies.  Yes Historical Provider, MD  potassium chloride SA (K-DUR,KLOR-CON) 20 MEQ tablet Take 40 mEq by mouth daily.    Yes Historical Provider, MD  predniSONE (DELTASONE) 10 MG tablet Take 10 mg by mouth daily.   Yes Historical Provider, MD  psyllium (HYDROCIL/METAMUCIL) 95 % PACK Take 1 packet by mouth daily. 05/15/11  Yes W Buren Kos, MD  sodium chloride (OCEAN) 0.65 % nasal spray Place 1 spray into the nose as needed. For allergies.   Yes Historical Provider, MD  vitamin B-12 (CYANOCOBALAMIN) 1000 MCG  tablet Take 1,000 mcg by mouth daily.     Yes Historical Provider, MD  vitamin E 600 UNIT capsule Take 600 Units by mouth daily.     Yes Historical Provider, MD    Current Facility-Administered Medications  Medication Dose Route Frequency Provider Last Rate Last Dose  . 0.9 %  sodium chloride infusion   Intravenous Once Toy Baker, MD       Current Outpatient Prescriptions  Medication Sig Dispense Refill  . acetaminophen (TYLENOL) 500 MG tablet Take 1,000 mg by mouth every 6 (six) hours as needed. For pain.      Marland Kitchen ALPRAZolam (XANAX) 0.25 MG tablet Take 0.25 mg by mouth 3 (three) times daily as needed. Anxiety and sleep      . Ascorbic Acid (VITAMIN C) 1000 MG tablet Take 1,000 mg by mouth daily.       Marland Kitchen atenolol (TENORMIN) 50 MG tablet Take 50 mg by mouth daily.        . Calcium Carbonate (CALTRATE 600) 1500 MG TABS Take 2 tablets by mouth daily. Daily       . dicyclomine (BENTYL) 10 MG capsule Take 1 capsule (10 mg total) by mouth 2 (two) times daily.  60 capsule  3  . ergocalciferol (VITAMIN D2) 50000 UNITS capsule Take 50,000 Units by mouth 2 (two) times a week. Takes 1 tablet every Monday and friday      . esomeprazole (NEXIUM) 40 MG capsule Take 40 mg by mouth 2 (two) times daily.       . folic acid (FOLVITE) 400 MCG tablet Take 400 mcg by mouth daily.        . furosemide (LASIX) 40 MG tablet Take 40 mg by mouth daily.        Marland Kitchen loratadine (CLARITIN) 10 MG tablet Take 10 mg by mouth daily as needed. For allergies.      . potassium chloride SA (K-DUR,KLOR-CON) 20 MEQ tablet Take 40 mEq by mouth daily.       . predniSONE (DELTASONE) 10 MG tablet Take 10 mg by mouth daily.      . psyllium (HYDROCIL/METAMUCIL) 95 % PACK Take 1 packet by mouth daily.  56 each  6  . sodium chloride (OCEAN) 0.65 % nasal spray Place 1 spray into the nose as needed. For allergies.      . vitamin B-12 (CYANOCOBALAMIN) 1000 MCG tablet Take 1,000 mcg by mouth daily.        . vitamin E 600 UNIT capsule Take  600 Units by mouth daily.          Allergies as of 05/27/2011 - Review Complete 05/27/2011  Allergen Reaction Noted  . Codeine Nausea And Vomiting     Family History  Problem Relation Age of Onset  . Dementia Mother   . Hypertension Father   . Malignant hyperthermia Father     History   Social History  . Marital Status: Legally Separated  Spouse Name: N/A    Number of Children: 5  . Years of Education: N/A   Occupational History  . retired     Social History Main Topics  . Smoking status: Never Smoker   . Smokeless tobacco: Current User    Types: Snuff  . Alcohol Use: No  . Drug Use: No  . Sexually Active: No    Review of Systems: All systems reviewed and negative except where noted in HPI  PHYSICAL EXAM: Vital signs in last 24 hours: Temp:  [98 F (36.7 C)] 98 F (36.7 C) (05/14 1313) Pulse Rate:  [85] 85  (05/14 1313) Resp:  [18] 18  (05/14 1313) BP: (137)/(67) 137/67 mmHg (05/14 1313) SpO2:  [98 %] 98 % (05/14 1313)   General:   Pleasant black female in NAD Head:  Normocephalic and atraumatic. Eyes:   No icterus.   Conjunctiva pale. Ears:  Normal auditory acuity. .Neck:  Supple; no masses felt Lungs:  Respirations even and unlabored. Lungs clear to auscultation bilaterally.   No wheezes, crackles, or rhonchi.  Heart:  Regular rate and rhythm; no murmurs heard. Abdomen:  Soft, nondistended, mild LLQ tenderness. Normal bowel sounds. No appreciable masses or hepatomegaly.  Rectal:  Scant amount dark red blood in vault.   Msk:  Symmetrical without gross deformities.  Extremities:  Without edema. Neurologic:  Alert and  oriented;  grossly normal neurologically. Skin:  Intact without significant lesions or rashes. Cervical Nodes:  No significant cervical adenopathy. Psych:  Alert and cooperative. Normal affect.  LAB RESULTS:  Basename 05/27/11 1330  WBC 11.2*  HGB 9.1*  HCT 28.6*  PLT 372   BMET  Basename 05/27/11 1330  NA 141  K 4.6  CL 103   CO2 26  GLUCOSE 114*  BUN 26*  CREATININE 0.61  CALCIUM 8.9   LFT  Basename 05/27/11 1330  PROT 6.7  ALBUMIN 3.4*  AST 32  ALT 26  ALKPHOS 60  BILITOT 0.2*  BILIDIR --  IBILI --   PT/INR  Basename 05/27/11 1330  LABPROT 13.2  INR 0.98   Prior Endoscopies:  Colonoscopy December 2012 -Friable cecal ulcer, multiple colon polyps (adenomatous).  EGD November 2012-  normal  EGD March 2011 for anemia, heme + stools - Abnormal mucosa in the antrum - gastritis vs. gastric antral  vascular ectasia. Biopsies c/w erosive gastritis. 2) Submucosal nodule in the body of the stomach - unchanged from prior descriptions. 3) Erosions, multiple in the second portion duodenum (a few and  superficial - tiny) 4) Polyps - a few subcentimeter in proximal stomach   EUS Aug 2010 for gastric subepithelial lesion -9.49mm by 9.74mm subepithelial lesion in distal gastric body that is asymptomatic and has not grown since first EUS evaluation over 3 years ago. Felt to be either a leiomyoma or very small GIST and needs no further evaluation unless new symptoms arise.   Colonoscopy March 2010 for history of pre-cancerous (adenomatous) colon polyps ((12/2005 3 polyps removed, all adenomas, one with villous component). FINDINGS: A total of 5 colon polyps were found and removed. Two 3-66mm, sessile polyps were removed from cecum by cold snare technique. One was retreived and sent to pathology (jar 1). Two 5-23mm sessile polyps were found in transverse colon, both were removed with snare/cautery method, one was retrieved and sent to pathology (jar 2). A final 5-42mm polyp was found and removed from sigmoid colon using snare/cautery method. This was not retrieved (see image1, image5, and image8). This was otherwise  a normal examination    IMPRESSION / PLAN: 39. 71 year old female with recurrent hematochezia. She has had at least 3 admissions since November 2012 for bleeding. Bleeding scan, EGDs were negative. She did  have a friable cecal ulcer (with ? ulcerated AVM) in December 2012. Unlike previous episodes patient did have lower abdominal pain prior to the bleeding so perhaps this time she has ischemic process??. She isn't really tender on exam and WBC is only minimally elevated at 11.2 arguing against ischemic colitis but still need to consider. A bleeding scan has been ordered by PCP, further recommendations pending results.   2. History of tubulovillous adenomatous polyps (2010). Last colonoscopy in December 2012 she had adenomatous polyps as well.  3. Polymyositis, on chronic steroids.   4. History of dyspepsia, she is PPI at home  5. Osteoporosis  Thanks   LOS: 0 days   Willette Cluster  05/27/2011, 4:38 PM  [facility] Attending  I have also seen and assessed the patient and agree with the above note.  Second bleed and admit since December. She had an unusual lesion in ceum thought to be an ulcer with ? Of AVM component then. It was not treated. If GI bleeding scan is unhelpful (it is negative at 1 hour and ongoing) then a repeat colonoscopy will be needed. i have explained to her. Could need another EGD or capsule endoscopy of small bowel also. BUN is up but ratio not consistent with UGI bleed and no melena.  Clear liquids ok for now - ordered Will reassess in AM  Iva Boop, MD, [facility] [phone] (pager) 05/27/2011 8:50 PM

## 2011-05-27 NOTE — ED Notes (Signed)
ZOX:[id number]<AV> Expected date:<BR> Expected time:<BR> Means of arrival:Ambulance<BR> Comments:<BR>

## 2011-05-28 ENCOUNTER — Inpatient Hospital Stay (HOSPITAL_COMMUNITY): Payer: Medicare Other

## 2011-05-28 ENCOUNTER — Encounter (HOSPITAL_COMMUNITY): Payer: Self-pay | Admitting: Radiology

## 2011-05-28 DIAGNOSIS — R1032 Left lower quadrant pain: Secondary | ICD-10-CM | POA: Diagnosis present

## 2011-05-28 LAB — BASIC METABOLIC PANEL
CO2: 24 mEq/L (ref 19–32)
Calcium: 8.7 mg/dL (ref 8.4–10.5)
Creatinine, Ser: 0.64 mg/dL (ref 0.50–1.10)
GFR calc Af Amer: >90 mL/min (ref >90)
GFR calc non Af Amer: 88 mL/min — ABNORMAL LOW (ref >90)
Sodium: 141 mEq/L (ref 135–145)

## 2011-05-28 LAB — CBC
MCH: 26 pg (ref 26.0–34.0)
Platelets: 354 10*3/uL (ref 150–400)
RBC: 3.11 MIL/uL — ABNORMAL LOW (ref 3.87–5.11)
RDW: 18.3 % — ABNORMAL HIGH (ref 11.5–15.5)

## 2011-05-28 MED ORDER — PEG 3350-KCL-NA BICARB-NACL 420 G PO SOLR
4000.0000 mL | Freq: Once | ORAL | Status: AC
  Administered 2011-05-28: 4000 mL via ORAL

## 2011-05-28 MED ORDER — IOHEXOL 300 MG/ML  SOLN
100.0000 mL | Freq: Once | INTRAMUSCULAR | Status: AC | PRN
  Administered 2011-05-28: 100 mL via INTRAVENOUS

## 2011-05-28 MED ORDER — PREDNISONE 20 MG PO TABS
20.0000 mg | ORAL_TABLET | Freq: Every day | ORAL | Status: DC
  Administered 2011-05-28 – 2011-05-31 (×4): 20 mg via ORAL
  Filled 2011-05-28 (×5): qty 1

## 2011-05-28 MED ORDER — METOCLOPRAMIDE HCL 5 MG/ML IJ SOLN
10.0000 mg | Freq: Once | INTRAMUSCULAR | Status: AC
  Administered 2011-05-28: 10 mg via INTRAVENOUS

## 2011-05-28 MED ORDER — METOCLOPRAMIDE HCL 5 MG/ML IJ SOLN
10.0000 mg | Freq: Once | INTRAMUSCULAR | Status: DC
  Filled 2011-05-28: qty 2

## 2011-05-28 NOTE — Progress Notes (Signed)
   CARE MANAGEMENT NOTE 05/28/2011  Patient:  [name] [name], [name] [name]   Account Number:  [id number]  Date Initiated:  05/28/2011  Documentation initiated by:  Raiford Noble  Subjective/Objective Assessment:   pt adm with gi bleed     Action/Plan:   lives alone but has aide assistance; supportive son; PCP: Dr Clelia Croft. Interested in SNF basing on cost   Anticipated DC Date:  05/30/2011   Anticipated DC Plan:  SKILLED NURSING FACILITY  In-house referral  Clinical Social Worker      DC Associate Professor  CM consult      [facility] Choice  HOME HEALTH   Choice offered to / List presented to:  C-1 Patient   DME arranged  BEDSIDE COMMODE      DME agency  [facility]     [facility] arranged  HH-1 RN  HH-2 PT  HH-3 OT      [facility] agency  [facility]   Status of service:  In process, will continue to follow Medicare Important Message given?   (If response is "NO", the following Medicare IM given date fields will be blank) Date Medicare IM given:   Date Additional Medicare IM given:    Discharge Disposition:    Per UR Regulation:  Reviewed for med. necessity/level of care/duration of stay  If discussed at Long Length of Stay Meetings, dates discussed:    Comments:  05-28-11 Raiford Noble, [place] [phone] Spoke with Mrs. Surprenant at bedside. She lives alone and has aide assistance daily from 2-4 hrs a day. PCP: Dr Clelia Croft. Pt reports her interest in [facility]. Also reports having [facility] in the past. Will require PT/OT/RN services along with 3 in 1 commode if she goes home. Will leave message for Dr Clelia Croft to please order PT/OT. Also made CSW aware. Made Debbie with Genevieve Norlander for potential referral as well. Will cont to follw.

## 2011-05-28 NOTE — Progress Notes (Signed)
[facility] Progress Note  SUBJECTIVE: no further bleeding  OBJECTIVE:  Vital signs in last 24 hours: Temp:  [98 F (36.7 C)-98.6 F (37 C)] 98.6 F (37 C) (05/15 0609) Pulse Rate:  [74-85] 84  (05/15 0609) Resp:  [18] 18  (05/15 0609) BP: (98-137)/(49-71) 120/71 mmHg (05/15 0609) SpO2:  [96 %-99 %] 96 % (05/15 0609)   General:    Pleasant black female in NAD Heart:  Regular rate and rhythm Lungs: Respirations even and unlabored, lungs CTA bilaterally Abdomen:  Soft, obese, mild LLQ tenderness. Normal bowel sounds. Extremities:  Without edema. Neurologic:  Alert and oriented,  grossly normal neurologically. Psych:  Cooperative. Normal mood and affect.   Lab Results:  Basename 05/28/11 0745 05/27/11 2319 05/27/11 1330  WBC 9.0 -- 11.2*  HGB 8.1* 9.1* 9.1*  HCT 25.4* 28.6* 28.6*  PLT 354 -- 372   BMET  Basename 05/28/11 0745 05/27/11 1330  NA 141 141  K 3.5 4.6  CL 105 103  CO2 24 26  GLUCOSE 79 114*  BUN 24* 26*  CREATININE 0.64 0.61  CALCIUM 8.7 8.9   LFT  Basename 05/27/11 1330  PROT 6.7  ALBUMIN 3.4*  AST 32  ALT 26  ALKPHOS 60  BILITOT 0.2*  BILIDIR --  IBILI --   PT/INR  Basename 05/27/11 1330  LABPROT 13.2  INR 0.98    Studies/Results: Nm Gi Blood Loss  05/27/2011  *RADIOLOGY REPORT*  Clinical Data: Right lower abdominal pain.  Bloody stools.  NUCLEAR MEDICINE GASTROINTESTINAL BLEEDING STUDY  Technique:  Sequential abdominal images were obtained following intravenous administration of Tc-55m labeled red blood cells.  Radiopharmaceutical: CURIE ULTRATAG TECHNETIUM TC 59M- LABELED RED BLOOD CELLS IV KIT  Comparison: 01/07/2011  Findings: Expected vascular activity is observed.  Over 2 hours of observation, we do not demonstrate abnormal tagged red blood cell extension into bowel.  A small amount of bladder activity is visible on the later images, likely related to free pertechnetate uptake.  IMPRESSION:  1.  No scintigraphic  evidence of active GI bleeding.  Original Report Authenticated By: Dellia Cloud, M.D.   ASSESSMENT / PLAN:  1 Hematochezia with lower abdominal discomfort., no further bleeding since yesterday afternoon. Bleeding scan was negative. She is drinking contrast for CTscan. If CTscan negative then may need colonoscopy for further evaluation, especially given findings of cecal ulcer on December 2012 colonoscopy. .    2. Anemia of acute blood loss. Hemoglobin down a gram overnight from 9.1 to 8. 1 without further bleeding, probably re-equilibration.      LOS: 1 day   Willette Cluster  05/28/2011, 9:15 AM   GI Attending  I have also seen and assessed the patient and agree with the above note. Additions below.  She did pass some more red blood after having CT scan. Ct abd/pelvis shows colonic and ileal diverticulosis and other chronic changes but no clear cause of problems. Have explained to patient and daughter.  Will perform colonoscopy tomorrow at 0730. The risks and benefits as well as alternatives of endoscopic procedure(s) have been discussed and reviewed. All questions answered. The patient agrees to proceed.   Iva Boop, MD, [facility] [phone] (pager) 05/28/2011 11:46 AM

## 2011-05-28 NOTE — Progress Notes (Signed)
Subjective: No further bleeding since 2:30 yesterday in ED.  No BM overnight.  Abdominal cramping continues, most prominent in left lower quadrant.  Objective: Vital signs in last 24 hours: Temp:  [98 F (36.7 C)-98.6 F (37 C)] 98.6 F (37 C) (05/15 0609) Pulse Rate:  [74-85] 84  (05/15 0609) Resp:  [18] 18  (05/15 0609) BP: (98-137)/(49-71) 120/71 mmHg (05/15 0609) SpO2:  [96 %-99 %] 96 % (05/15 0609) Weight change:     CBG (last 3)  No results found for this basename: GLUCAP:3 in the last 72 hours  Intake/Output from previous day:   Intake/Output this shift:    General appearance: alert and no distress Eyes: no scleral icterus Throat: oropharynx moist without erythema Resp: clear to auscultation bilaterally Cardio: regular rate and rhythm, S1, S2 normal, no murmur, click, rub or gallop GI: soft, non-distended; bowel sounds normal; no masses,  no organomegaly; less left lower quadrant tenderness Extremities: no clubbing, cyanosis or edema   Lab Results:  Basename 05/27/11 1330  NA 141  K 4.6  CL 103  CO2 26  GLUCOSE 114*  BUN 26*  CREATININE 0.61  CALCIUM 8.9  MG --  PHOS --    Basename 05/27/11 1330  AST 32  ALT 26  ALKPHOS 60  BILITOT 0.2*  PROT 6.7  ALBUMIN 3.4*    Basename 05/27/11 2319 05/27/11 1330  WBC -- 11.2*  NEUTROABS -- 9.5*  HGB 9.1* 9.1*  HCT 28.6* 28.6*  MCV -- 83.1  PLT -- 372   Lab Results  Component Value Date   INR 0.98 05/27/2011   INR 0.93 05/13/2011   INR 0.91 01/07/2011   No results found for this basename: CKTOTAL:3,CKMB:3,CKMBINDEX:3,TROPONINI:3 in the last 72 hours No results found for this basename: TSH,T4TOTAL,FREET3,T3FREE,THYROIDAB in the last 72 hours No results found for this basename: VITAMINB12:2,FOLATE:2,FERRITIN:2,TIBC:2,IRON:2,RETICCTPCT:2 in the last 72 hours  Studies/Results: Nm Gi Blood Loss  05/27/2011  *RADIOLOGY REPORT*  Clinical Data: Right lower abdominal pain.  Bloody stools.  NUCLEAR MEDICINE  GASTROINTESTINAL BLEEDING STUDY  Technique:  Sequential abdominal images were obtained following intravenous administration of Tc-21m labeled red blood cells.  Radiopharmaceutical: CURIE ULTRATAG TECHNETIUM TC 48M- LABELED RED BLOOD CELLS IV KIT  Comparison: 01/07/2011  Findings: Expected vascular activity is observed.  Over 2 hours of observation, we do not demonstrate abnormal tagged red blood cell extension into bowel.  A small amount of bladder activity is visible on the later images, likely related to free pertechnetate uptake.  IMPRESSION:  1.  No scintigraphic evidence of active GI bleeding.  Original Report Authenticated By: Dellia Cloud, M.D.     Medications: Scheduled:   . sodium chloride   Intravenous Once  . dicyclomine  10 mg Oral BID  . folic acid  0.5 mg Oral Daily  . loratadine  10 mg Oral Daily  . pantoprazole  40 mg Oral BID AC  . predniSONE  40 mg Oral QAC breakfast  . psyllium  1 packet Oral Daily  . DISCONTD: folic acid  400 mcg Oral Daily   Continuous:   . sodium chloride 75 mL/hr at 05/27/11 2327    Assessment/Plan: Principal Problem: 1. *Hematochezia- no further bleeding since admission.  GI Blood Loss scan was not helpful- was likely not actively  bleeding heavily enough for detection.  Appreciate GI guidance- anticipate colonoscopy.  Active Problems: 2. Acute blood loss anemia- Hg stable.  Recheck this am and monitor.  Transfuse if Hg less than 8.0.  3. LLQ Abdominal Pain- will obtain CT abd/pelvis to evaluate further for ?colitis vs. Diverticulitis.  Likely related to GI blood loss.  Continue Bentyl prn. 4. HYPERTENSION- anti-HTN medications held- will resume if BP remains stable 5. Polymyositis- continue steroids 6. Adrenal insufficiency- wean stress dosed steroids to 20mg  daily given stability 7. Cecal ulcer- GI will reevaluate with colonoscopy. 8. Disposition- pending colonoscopy results.     LOS: 1 day   Paxtyn Boyar,W DOUGLAS 05/28/2011,  7:24 AM

## 2011-05-29 ENCOUNTER — Encounter (HOSPITAL_COMMUNITY): Payer: Self-pay

## 2011-05-29 ENCOUNTER — Encounter (HOSPITAL_COMMUNITY)
Admission: EM | Disposition: A | Discharge status: Home / Self Care | Payer: Self-pay | Source: Home / Self Care | Attending: Internal Medicine

## 2011-05-29 DIAGNOSIS — D126 Benign neoplasm of colon, unspecified: Secondary | ICD-10-CM

## 2011-05-29 HISTORY — PX: COLONOSCOPY: SHX5424

## 2011-05-29 HISTORY — PX: HOT HEMOSTASIS: SHX5433

## 2011-05-29 LAB — CBC
HCT: 24.2 % — ABNORMAL LOW (ref 36.0–46.0)
HCT: 27 % — ABNORMAL LOW (ref 36.0–46.0)
Hemoglobin: 7.7 g/dL — ABNORMAL LOW (ref 12.0–15.0)
MCH: 26 pg (ref 26.0–34.0)
MCH: 26.2 pg (ref 26.0–34.0)
MCHC: 31.8 g/dL (ref 30.0–36.0)
MCV: 81.3 fL (ref 78.0–100.0)
Platelets: 299 10*3/uL (ref 150–400)
RBC: 2.96 MIL/uL — ABNORMAL LOW (ref 3.87–5.11)
RBC: 3.32 MIL/uL — ABNORMAL LOW (ref 3.87–5.11)
RDW: 18 % — ABNORMAL HIGH (ref 11.5–15.5)
WBC: 11.2 10*3/uL — ABNORMAL HIGH (ref 4.0–10.5)

## 2011-05-29 LAB — BASIC METABOLIC PANEL
BUN: 12 mg/dL (ref 6–23)
CO2: 21 mEq/L (ref 19–32)
Calcium: 8.4 mg/dL (ref 8.4–10.5)
GFR calc non Af Amer: 90 mL/min — ABNORMAL LOW (ref >90)
Glucose, Bld: 70 mg/dL (ref 70–99)

## 2011-05-29 SURGERY — COLONOSCOPY
Anesthesia: Moderate Sedation

## 2011-05-29 MED ORDER — ACETAMINOPHEN 325 MG PO TABS
650.0000 mg | ORAL_TABLET | Freq: Once | ORAL | Status: AC
  Administered 2011-05-29: 650 mg via ORAL
  Filled 2011-05-29 (×2): qty 2

## 2011-05-29 MED ORDER — MIDAZOLAM HCL 10 MG/2ML IJ SOLN
INTRAMUSCULAR | Status: AC
  Filled 2011-05-29: qty 2

## 2011-05-29 MED ORDER — MIDAZOLAM HCL 10 MG/2ML IJ SOLN
INTRAMUSCULAR | Status: DC | PRN
  Administered 2011-05-29 (×2): 2 mg via INTRAVENOUS
  Administered 2011-05-29 (×2): 1 mg via INTRAVENOUS

## 2011-05-29 MED ORDER — FENTANYL CITRATE 0.05 MG/ML IJ SOLN
INTRAMUSCULAR | Status: AC
  Filled 2011-05-29: qty 2

## 2011-05-29 MED ORDER — POTASSIUM CHLORIDE CRYS ER 20 MEQ PO TBCR
40.0000 meq | EXTENDED_RELEASE_TABLET | Freq: Two times a day (BID) | ORAL | Status: AC
  Administered 2011-05-29 (×2): 40 meq via ORAL
  Filled 2011-05-29 (×2): qty 2

## 2011-05-29 MED ORDER — DIPHENHYDRAMINE HCL 50 MG/ML IJ SOLN
INTRAMUSCULAR | Status: AC
  Filled 2011-05-29: qty 1

## 2011-05-29 MED ORDER — FENTANYL NICU IV SYRINGE 50 MCG/ML
INJECTION | INTRAMUSCULAR | Status: DC | PRN
  Administered 2011-05-29 (×3): 25 ug via INTRAVENOUS

## 2011-05-29 NOTE — Progress Notes (Signed)
Subjective: Believes she had 1 bloody BM after CT contrast yesterday afternoon.  No blood in stools with bowel prep for colonoscopy, stools clear liquid per patient.  Abdominal pain unchanged.  Currently in endoscopy suite for colonoscopy.  Objective: Vital signs in last 24 hours: Temp:  [97.8 F (36.6 C)-98.7 F (37.1 C)] 98.7 F (37.1 C) (05/16 0600) Pulse Rate:  [60-100] 100  (05/16 0600) Resp:  [18-20] 20  (05/16 0600) BP: (127-158)/(60-91) 133/75 mmHg (05/16 0600) SpO2:  [93 %-100 %] 98 % (05/16 0600) Weight change:  Last BM Date: 05/27/11  CBG (last 3)   Basename 05/28/11 1549  GLUCAP 97    Intake/Output from previous day: 05/15 0701 - 05/16 0700 In: 2172.5 [P.O.:480; I.V.:1692.5] Out: 1 [Urine:1] Intake/Output this shift:    General appearance: alert and no distress Eyes: no scleral icterus Throat: oropharynx moist without erythema Resp: clear to auscultation bilaterally Cardio: regular rate and rhythm, S1, S2 normal, no murmur, click, rub or gallop GI: soft; bowel sounds normal; no masses,  no organomegaly; minimal left lower quadrant abdominal pain. Extremities: no clubbing, cyanosis or edema   Lab Results:  [facility] 05/29/11 0504 05/28/11 0745  NA 141 141  K 3.2* 3.5  CL 108 105  CO2 21 24  GLUCOSE 70 79  BUN 12 24*  CREATININE 0.60 0.64  CALCIUM 8.4 8.7  MG -- --  PHOS -- --    Basename 05/27/11 1330  AST 32  ALT 26  ALKPHOS 60  BILITOT 0.2*  PROT 6.7  ALBUMIN 3.4*    Basename 05/29/11 0504 05/28/11 1653 05/28/11 0745 05/27/11 1330  WBC 10.1 -- 9.0 --  NEUTROABS -- -- -- 9.5*  HGB 7.7* 8.8* -- --  HCT 24.2* 27.8* -- --  MCV 81.8 -- 81.7 --  PLT 335 -- 354 --   Lab Results  Component Value Date   INR 0.98 05/27/2011   INR 0.93 05/13/2011   INR 0.91 01/07/2011   No results found for this basename: CKTOTAL:3,CKMB:3,CKMBINDEX:3,TROPONINI:3 in the last 72 hours No results found for this basename: TSH,T4TOTAL,FREET3,T3FREE,THYROIDAB in  the last 72 hours No results found for this basename: VITAMINB12:2,FOLATE:2,FERRITIN:2,TIBC:2,IRON:2,RETICCTPCT:2 in the last 72 hours  Studies/Results: Nm Gi Blood Loss  05/27/2011  *RADIOLOGY REPORT*  Clinical Data: Right lower abdominal pain.  Bloody stools.  NUCLEAR MEDICINE GASTROINTESTINAL BLEEDING STUDY  Technique:  Sequential abdominal images were obtained following intravenous administration of Tc-80m labeled red blood cells.  Radiopharmaceutical: CURIE ULTRATAG TECHNETIUM TC 62M- LABELED RED BLOOD CELLS IV KIT  Comparison: 01/07/2011  Findings: Expected vascular activity is observed.  Over 2 hours of observation, we do not demonstrate abnormal tagged red blood cell extension into bowel.  A small amount of bladder activity is visible on the later images, likely related to free pertechnetate uptake.  IMPRESSION:  1.  No scintigraphic evidence of active GI bleeding.  Original Report Authenticated By: Dellia Cloud, M.D.   Ct Abdomen Pelvis W Contrast  05/28/2011  *RADIOLOGY REPORT*  Clinical Data: Left lower quadrant abdominal pain.  CT ABDOMEN AND PELVIS WITH CONTRAST  Technique:  Multidetector CT imaging of the abdomen and pelvis was performed following the standard protocol during bolus administration of intravenous contrast.  Contrast: OMNIPAQUE IOHEXOL 300 MG/ML  SOLN  Comparison: 03/31/2009  Findings: Stable scarring noted at the right base.  No focal abnormalities seen in the liver or spleen.  The stomach, duodenum, pancreas, gallbladder, and adrenal glands are unremarkable.  Left kidney is unremarkable.  18  mm cyst is identified in the upper pole of the right kidney, without substantial change.  No abdominal aortic aneurysm.  There is no free fluid or lymphadenopathy in the abdomen.  The abdominal bowel loops are unremarkable.  No free fluid is seen in the pelvis.  There is no pelvic sidewall lymphadenopathy.  Bladder is unremarkable.  Uterus is surgically absent.  There  is no adnexal mass.  Diverticular changes are seen in the left colon without diverticulitis.  Diverticular changes are seen in the terminal ileum which is otherwise unremarkable. The appendix is not visualized, but there is no edema or inflammation in the region of the cecum.  The patient is status post PLIF at L4-5.  IMPRESSION: Stable exam.  No acute findings in the abdomen or pelvis.  Original Report Authenticated By: ERIC A. MANSELL, M.D.     Medications: Scheduled:   . sodium chloride   Intravenous Once  . dicyclomine  10 mg Oral BID  . folic acid  0.5 mg Oral Daily  . loratadine  10 mg Oral Daily  . metoCLOPramide (REGLAN) injection  10 mg Intravenous Once  . metoCLOPramide (REGLAN) injection  10 mg Intravenous Once  . pantoprazole  40 mg Oral BID AC  . polyethylene glycol-electrolytes  4,000 mL Oral Once  . predniSONE  20 mg Oral QAC breakfast  . psyllium  1 packet Oral Daily  . DISCONTD: predniSONE  40 mg Oral QAC breakfast   Continuous:   . sodium chloride 75 mL/hr at 05/28/11 1900    Assessment/Plan: Principal Problem:  1. *Hematochezia- ?recurrent bleeding yesterday. GI Blood Loss scan and CT abd/pelvis unrevealing.  Scheduled for colonoscopy shortly.  Active Problems:  2. Acute blood loss anemia- Hg slightly lower- will transfuse 1 unit PRBC and monitor.  3. LLQ Abdominal Pain- CT negative.  Likely related to GI blood loss. Continue Bentyl prn.  4. HYPERTENSION- anti-HTN medications held- will resume if BP remains stable  5. Polymyositis- continue steroids  6. Adrenal insufficiency- continue to wean stress dosed steroids tomorrow. 7. Cecal ulcer- GI will reevaluate with colonoscopy.  8. Disposition- further plan pending colonoscopy results and further blood loss.   LOS: 2 days   Anwyn Kriegel,W DOUGLAS 05/29/2011, 7:15 AM

## 2011-05-29 NOTE — Progress Notes (Signed)
PT Cancellation Note  Treatment cancelled today due to patient's refusal to participate.  I spoke with this patient at length re: her current and prior physical functioning.  She feels that she is currently at her physical baseline from home "I don't feel weak".  She has 24 hour physical assistance from her son (who is blind, but very high functioning) and is able to perform her own bathing, dressing and transfers into and out of her scooter chair.  She is non-ambulatory right now without her son's help and reports that they practice walking once a day together.  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  She is currently active with HHPT and a HH aide.  She reports that she is thinking about suspending her HHPT until after she sees the Rheumatologist about her legs.  She is also agreeable for [place] to check back tomorrow to help her get OOB and do some of her exercises from home.  She politely declined therapy today.    Rollene Rotunda Trinh Sanjose, PT, DPT [phone] 05/29/2011, 3:50 PM

## 2011-05-29 NOTE — Op Note (Signed)
[facility] [address] [place], [place]  [address]  COLONOSCOPY PROCEDURE REPORT  PATIENT:  [name] [name], [name] [name]  MR#:  [id number] BIRTHDATE:  04-20-1940, 70 yrs. old  GENDER:  female ENDOSCOPIST:  Iva Boop, MD, FACG : PROCEDURE DATE:  05/29/2011 PROCEDURE:  Colonoscopy for control of bleeding ASA CLASS:  Class III INDICATIONS:  Gastrointestinal hemorrhage recurrent lower GI bleeding MEDICATIONS:   Fentanyl 75 mcg IV, Versed 6 mg IV  DESCRIPTION OF PROCEDURE:   After the risks benefits and alternatives of the procedure were thoroughly explained, informed consent was obtained.  Digital rectal exam was performed and revealed no abnormalities.   The Pentax Colonoscope [id number] endoscope was introduced through the anus and advanced to the cecum, which was identified by both the appendix and ileocecal valve, without limitations.  The quality of the prep was excellent, using MoviPrep.  The instrument was then slowly withdrawn as the colon was fully examined. <<PROCEDUREIMAGES>>  FINDINGS:  An ulcer was found in the cecum. Ulcerated AVM. Oozing blood. APC treatment attempted but persistent bleeding from central ulecrated portion. Bleeding controlled with placement of 3 clips.  A diminutive polyp was found in the proximal transverse colon. Polyp was snared without cautery. Retrieval was successful. snare polyp  Moderate diverticulosis was found.  This was otherwise a normal examination of the colon.   Retroflexed views in the rectum revealed no abnormalities.    The time to cecum = 2:00 minutes. Treatment time and withdrawl time was 19:00 minutes. 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 Replete  iron. Think she could see GI prn recurrent bleeding. Small polyp will not change any current colon recall plans. Dr. Christella Hartigan, primary GI MD is already aware.  Iva Boop, MD, [facility]  CC:  Kari Baars, MD and The Patient  n. eSIGNED:   Iva Boop at 05/29/2011 08:27 AM  Roderick Pee, [phone]

## 2011-05-29 NOTE — Progress Notes (Signed)
Pt w HgB of 7.7 and now receiving  Blood.  Will return as schedule allows.  Tory Emerald, [place] [phone]

## 2011-05-30 ENCOUNTER — Encounter (HOSPITAL_COMMUNITY): Payer: Self-pay | Admitting: Internal Medicine

## 2011-05-30 DIAGNOSIS — Q2733 Arteriovenous malformation of digestive system vessel: Secondary | ICD-10-CM

## 2011-05-30 DIAGNOSIS — K5521 Angiodysplasia of colon with hemorrhage: Secondary | ICD-10-CM

## 2011-05-30 LAB — CBC
HCT: 25.6 % — ABNORMAL LOW (ref 36.0–46.0)
Hemoglobin: 8.2 g/dL — ABNORMAL LOW (ref 12.0–15.0)
MCH: 25.9 pg — ABNORMAL LOW (ref 26.0–34.0)
MCHC: 32 g/dL (ref 30.0–36.0)
RDW: 18.2 % — ABNORMAL HIGH (ref 11.5–15.5)

## 2011-05-30 LAB — TYPE AND SCREEN
ABO/RH(D): O POS
Antibody Screen: NEGATIVE
Unit division: 0

## 2011-05-30 LAB — BASIC METABOLIC PANEL
BUN: 6 mg/dL (ref 6–23)
Calcium: 8.5 mg/dL (ref 8.4–10.5)
Creatinine, Ser: 0.61 mg/dL (ref 0.50–1.10)
GFR calc Af Amer: >90 mL/min (ref >90)
GFR calc non Af Amer: 89 mL/min — ABNORMAL LOW (ref >90)
Glucose, Bld: 80 mg/dL (ref 70–99)
Potassium: 4 mEq/L (ref 3.5–5.1)

## 2011-05-30 MED ORDER — SIMETHICONE 80 MG PO CHEW
80.0000 mg | CHEWABLE_TABLET | Freq: Four times a day (QID) | ORAL | Status: DC | PRN
  Administered 2011-05-30 (×2): 80 mg via ORAL
  Filled 2011-05-30 (×2): qty 1

## 2011-05-30 MED ORDER — ALUM & MAG HYDROXIDE-SIMETH 200-200-20 MG/5ML PO SUSP
30.0000 mL | ORAL | Status: DC | PRN
  Administered 2011-05-30 – 2011-05-31 (×3): 30 mL via ORAL
  Filled 2011-05-30 (×3): qty 30

## 2011-05-30 MED ORDER — SODIUM CHLORIDE 0.9 % IV SOLN
25.0000 mg | Freq: Once | INTRAVENOUS | Status: AC
  Administered 2011-05-30: 25 mg via INTRAVENOUS
  Filled 2011-05-30: qty 0.5

## 2011-05-30 MED ORDER — IRON DEXTRAN 50 MG/ML IJ SOLN
1000.0000 mg | Freq: Once | INTRAMUSCULAR | Status: AC
  Administered 2011-05-30: 1000 mg via INTRAVENOUS
  Filled 2011-05-30 (×2): qty 20

## 2011-05-30 MED ORDER — ATENOLOL 50 MG PO TABS
50.0000 mg | ORAL_TABLET | Freq: Every day | ORAL | Status: DC
  Administered 2011-05-30 – 2011-05-31 (×2): 50 mg via ORAL
  Filled 2011-05-30 (×2): qty 1

## 2011-05-30 NOTE — Progress Notes (Signed)
05-30-11 Spoke with patient at bedside who declines hhc or snf at dc. Lives with son and plans to f/u with her rheumatologist before she participates with therapy. Has equipment she needs at home including a hoovaround. No further needs assessed.   [place], [place]  [phone]

## 2011-05-30 NOTE — Progress Notes (Signed)
Tolerated iv iron dose message sent to pharm to send next dose.

## 2011-05-30 NOTE — Evaluation (Signed)
Physical Therapy Evaluation Patient Details Name: [name] [name] MRN: [id number] DOB: 17-May-1940 Today's Date: 05/30/2011 Time: 4098-1191 PT Time Calculation (min): 33 min  PT Assessment / Plan / Recommendation Clinical Impression  Pt presents with diagnosis of hematochezia. Pt denies any weakness during stay. Does not have concerns about discharging back home, in respect to mobility. Pt able to verbalize in detail how she completes all tasks at home, including scoot/squat-pivot transfers. Performed well with only set-up assist for transfer from bed to chair with even leveled surfaces. Pt states all her transfer surfaces are level and she will not have any difficulty performing transfers at home. Pt also stated she does not plan to resume HHPT until she has follow-up appts with rheumatology  MD. Will follow pt wihile in acute setting .    PT Assessment  Patient needs continued PT services    Follow Up Recommendations  Pt was participating with HHPT but plans to postpone further therapy until she visits Rheumatology MD   Barriers to Discharge        lEquipment Recommendations  None recommended by PT    Recommendations for Other Services OT consult   Frequency Min 3X/week    Precautions / Restrictions Precautions Precautions: Fall Restrictions Weight Bearing Restrictions: No   Pertinent Vitals/Pain       Mobility  Bed Mobility Bed Mobility: Not assessed Details for Bed Mobility Assistance: Pt sitting EOB Transfers Transfers: Lateral/Scoot Programmer, systems Transfers Squat Pivot/Lateral Scoot Transfers: 3: Mod assist (wheelchair to bed) Lateral/Scoot Transfers: 5: Supervision (bed to wheelchair) Details for Transfer Assistance: Set-up/Supervision assist for lateral scoot transfer to R side to wheelchair with arm elevated. Mod A for lateral scoot/squat-pivot transfer to L side onto bed. Increased assist due to uneven surfaces (pt going uphill to get back onto bed).  Difficulty getting bottom fully on bed and scooting posteriorly- pt had to use R foot on wheelchair to push to scoot back fully onto bed.  Ambulation/Gait Ambulation/Gait Assistance: Not tested (comment) General Gait Details: Pt unable.     Exercises     PT Diagnosis:    PT Problem List: Decreased mobility PT Treatment Interventions: Therapeutic activities;Functional mobility training;Patient/family education;Balance training   PT Goals Acute Rehab PT Goals PT Goal Formulation: With patient Time For Goal Achievement: 06/06/11 Potential to Achieve Goals: Good Pt will go Supine/Side to Sit: with modified independence PT Goal: Supine/Side to Sit - Progress: Goal set today Pt will go Sit to Supine/Side: with modified independence PT Goal: Sit to Supine/Side - Progress: Goal set today Pt will Transfer Bed to Chair/Chair to Bed: with modified independence PT Transfer Goal: Bed to Chair/Chair to Bed - Progress: Goal set today  Visit Information  Last PT Received On: 05/30/11 Assistance Needed: +1    Subjective Data  Subjective: "I can do almost anything I want to at my house" Patient Stated Goal: Home   Prior Functioning  Home Living Lives With: Son Available Help at Discharge: Family Type of Home: House Home Layout: One level Home Adaptive Equipment: Walker - rolling;Wheelchair - manual (hover round) Prior Function Level of Independence: Independent with assistive device(s) Able to Take Stairs?: No Driving: No Communication Communication: No difficulties    Cognition  Overall Cognitive Status: Appears within functional limits for tasks assessed/performed Arousal/Alertness: Awake/alert Orientation Level: Appears intact for tasks assessed Behavior During Session: [facility] for tasks performed    Extremity/Trunk Assessment Right Lower Extremity Assessment RLE ROM/Strength/Tone: Deficits RLE ROM/Strength/Tone Deficits: Strength slightly > or = 2/5 RLE  Coordination: Deficits RLE  Coordination Deficits: Impaired coordination Left Lower Extremity Assessment LLE ROM/Strength/Tone: Deficits LLE ROM/Strength/Tone Deficits: Strength slightly > or = 2/5 LLE Coordination: Deficits LLE Coordination Deficits: Impaired coordination   Balance Balance Balance Assessed: Yes Static Sitting Balance Static Sitting - Balance Support: Feet unsupported;Bilateral upper extremity supported Static Sitting - Level of Assistance: 5: Stand by assistance Dynamic Sitting Balance Dynamic Sitting - Balance Support: Bilateral upper extremity supported;Feet supported Dynamic Sitting - Level of Assistance: 5: Stand by assistance;4: Min assist Dynamic Sitting - Comments: Pt able to maintain but does demonstrate some trunk instability during transition tasks and during scooting. No LOB. Increased posterior swaying and use of momentum.   End of Session PT - End of Session Equipment Utilized During Treatment: Gait belt Activity Tolerance: Patient tolerated treatment well Patient left: in bed;with family/visitor present;with call bell/phone within reach   Rebeca Alert [facility] 05/30/2011, 10:06 AM [phone]

## 2011-05-30 NOTE — Progress Notes (Signed)
Chart reviewed and spoke with pt.  She states she feels that she is at her baseline level of functioning, and does not want OT - politely declined.  She has adequate assistance at home.  Will sign off.  Jeani Hawking, OTR/L [phone]

## 2011-05-30 NOTE — Progress Notes (Signed)
Quick Note:  Small tubular adenoma Currently has 2015 colon recall - that will not change Note forwarded while she is an inpatient  ______

## 2011-05-30 NOTE — Progress Notes (Signed)
PHARMACY CONSULT FOR IV IRON  70 YOF with hx of recurrent lower GI bleed secondary to a cecal ulcer (12/12) and possible diverticulosis, upper GI bleed secondary to GI ST (8/08) who presented to the ER on 5/14 with recurrent right red blood per rectum. 5/16 colonoscopy: ulcerated AVM with bleeding in the cecum - treated with APC and 3 clips.  Hg 7.7 >> 8.7 >> 8.2 s/p 1 unit PRBC (5/16) Iron, TIBC, Ferritin pending (5/17)   A/P: Iron Dextran 25mg  IV test dose, if tolerates - 1000mg  IV x 1 dose today. F/U iron studies and if iron is low, recommend ferrous sulfate 325mg  po TID with meals.  Loralee Pacas, PharmD, BCPS Pager: [phone] 05/30/2011 8:06 AM

## 2011-05-30 NOTE — Progress Notes (Signed)
Patient stated that mylicon tablets not helping gas.  Patient requesting mylanta or pepto bismol.  Dr. Clelia Croft aware.  Awaiting orders.

## 2011-05-30 NOTE — Progress Notes (Signed)
   CARE MANAGEMENT NOTE 05/30/2011  Patient:  [name] [name], [name]   Account Number:  [id number]  Date Initiated:  05/28/2011  Documentation initiated by:  Raiford Noble  Subjective/Objective Assessment:   pt adm with gi bleed     Action/Plan:   lives alone but has aide assistance; supportive son; PCP: Dr Clelia Croft. Interested in SNF basing on cost   Anticipated DC Date:  06/01/2011   Anticipated DC Plan:  HOME/SELF CARE  In-house referral  Clinical Social Worker      DC Planning Services  CM consult      [facility] Choice  NA   Choice offered to / List presented to:  C-1 Patient   DME arranged  NA      DME agency  NA     HH arranged  NA  NA      HH agency  NA   Status of service:  In process, will continue to follow Medicare Important Message given?   (If response is "NO", the following Medicare IM given date fields will be blank) Date Medicare IM given:   Date Additional Medicare IM given:    Discharge Disposition:    Per UR Regulation:  Reviewed for med. necessity/level of care/duration of stay  If discussed at Long Length of Stay Meetings, dates discussed:    Comments:  05-30-11 Alesia Richards [phone] Spoke with patient at bedside. States she has decided against receiving any other therapy until she sees her Rheumatologist. Pleasantly declines SNF OR HHC. Plans to dc home with son. Reports having hoovaround w/c and other dme.      05-28-11 Oran Rein [phone] Spoke with [name] [name] [name] at bedside. She lives alone and has aide assistance daily from 2-4 hrs a day. PCP: Dr Clelia Croft. Pt reports her interest in [facility]. Also reports having [facility] in the past. Will require PT/OT/RN services along with 3 in 1 commode if she goes home. Will leave message for Dr Clelia Croft to please order PT/OT. Also made CSW aware. Made Debbie with [facility]lander for potential referral as well. Will cont to follw.

## 2011-05-30 NOTE — Progress Notes (Signed)
Subjective: Complains of increased gas after colonsocopy.  No BM since colonoscopy.    Objective: Vital signs in last 24 hours: Temp:  [97.7 F (36.5 C)-99.1 F (37.3 C)] 99.1 F (37.3 C) (05/17 0612) Pulse Rate:  [88-108] 88  (05/17 0612) Resp:  [15-80] 20  (05/17 0612) BP: (115-158)/(54-88) 115/70 mmHg (05/17 0612) SpO2:  [96 %-100 %] 96 % (05/17 0612) Weight change:  Last BM Date: 05/29/11  CBG (last 3)   Basename 05/28/11 1549  GLUCAP 97    Intake/Output from previous day: 05/16 0701 - 05/17 0700 In: 2722.1 [P.O.:480; I.V.:1856.3; Blood:385.8] Out: -  Intake/Output this shift:    General appearance: alert and no distress Eyes: no scleral icterus Throat: oropharynx moist without erythema Resp: clear to auscultation bilaterally Cardio: regular rate and rhythm, S1, S2 normal, no murmur, click, rub or gallop GI: soft,minimal suprapubic tenderness; bowel sounds normal; no masses,  no organomegaly Extremities: no clubbing, cyanosis or edema   Lab Results:  [facility] 05/30/11 0450 05/29/11 0504  NA 142 141  K 4.0 3.2*  CL 110 108  CO2 20 21  GLUCOSE 80 70  BUN 6 12  CREATININE 0.61 0.60  CALCIUM 8.5 8.4  MG -- --  PHOS -- --    Basename 05/27/11 1330  AST 32  ALT 26  ALKPHOS 60  BILITOT 0.2*  PROT 6.7  ALBUMIN 3.4*    Basename 05/30/11 0450 05/29/11 1435 05/27/11 1330  WBC 8.0 11.2* --  NEUTROABS -- -- 9.5*  HGB 8.2* 8.7* --  HCT 25.6* 27.0* --  MCV 81.0 81.3 --  PLT 295 299 --   Lab Results  Component Value Date   INR 0.98 05/27/2011   INR 0.93 05/13/2011   INR 0.91 01/07/2011   No results found for this basename: CKTOTAL:3,CKMB:3,CKMBINDEX:3,TROPONINI:3 in the last 72 hours No results found for this basename: TSH,T4TOTAL,FREET3,T3FREE,THYROIDAB in the last 72 hours No results found for this basename: VITAMINB12:2,FOLATE:2,FERRITIN:2,TIBC:2,IRON:2,RETICCTPCT:2 in the last 72 hours  Studies/Results: Ct Abdomen Pelvis W Contrast  05/28/2011   *RADIOLOGY REPORT*  Clinical Data: Left lower quadrant abdominal pain.  CT ABDOMEN AND PELVIS WITH CONTRAST  Technique:  Multidetector CT imaging of the abdomen and pelvis was performed following the standard protocol during bolus administration of intravenous contrast.  Contrast: OMNIPAQUE IOHEXOL 300 MG/ML  SOLN  Comparison: 03/31/2009  Findings: Stable scarring noted at the right base.  No focal abnormalities seen in the liver or spleen.  The stomach, duodenum, pancreas, gallbladder, and adrenal glands are unremarkable.  Left kidney is unremarkable.  18 mm cyst is identified in the upper pole of the right kidney, without substantial change.  No abdominal aortic aneurysm.  There is no free fluid or lymphadenopathy in the abdomen.  The abdominal bowel loops are unremarkable.  No free fluid is seen in the pelvis.  There is no pelvic sidewall lymphadenopathy.  Bladder is unremarkable.  Uterus is surgically absent.  There is no adnexal mass.  Diverticular changes are seen in the left colon without diverticulitis.  Diverticular changes are seen in the terminal ileum which is otherwise unremarkable. The appendix is not visualized, but there is no edema or inflammation in the region of the cecum.  The patient is status post PLIF at L4-5.  IMPRESSION: Stable exam.  No acute findings in the abdomen or pelvis.  Original Report Authenticated By: ERIC A. MANSELL, M.D.   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 sodium chloride   Intravenous Once  . acetaminophen  650 mg Oral Once  . dicyclomine  10 mg Oral BID  . folic acid  0.5 mg Oral Daily  . loratadine  10 mg Oral Daily  . metoCLOPramide (REGLAN) injection  10 mg Intravenous Once  . pantoprazole  40 mg Oral BID AC  . potassium  chloride  40 mEq Oral BID  . predniSONE  20 mg Oral QAC breakfast  . psyllium  1 packet Oral Daily   Continuous:   . sodium chloride 75 mL/hr at 05/30/11 0605    Assessment/Plan: Principal Problem:  1. *Hematochezia secondary to cecal AVM/ulcer with hemorrhage-appears to have resolved s/p clipping of cecal AVM though Hg has decreased slightly after transfusion. Will monitor Hg X 24 hours given slight decrease after transfusion. Active Problems:  2. Acute blood loss anemia-s/p 1 unit PRBC- Hg slightly decreased.  Will obtain iron levels and give IV Iron to replete stores. 3. LLQ Abdominal Pain- CT negative. Likely related to GI blood loss. Continue Bentyl prn.  Increased gas is likely secondary to colonoscopy.  Gas-X prn. 4. HYPERTENSION- resume Atenolol due to tachycardia.  BP stable. 5. Polymyositis- continue steroids.  Follow-up with Dr. Dareen Piano 6. Adrenal insufficiency- continue to wean stress dosed steroids to home dose 10mg  (20mg  today) 7. Disposition- Saline lock fluids, advance diet.  Monitor Hg X 24 hours and discharge to home tomorrow if stable.     LOS: 3 days   Lilias Lorensen,W DOUGLAS 05/30/2011, 7:23 AM

## 2011-05-30 NOTE — Progress Notes (Signed)
[facility] Progress Note  SUBJECTIVE: feels okay, no further bleeding   OBJECTIVE:  Vital signs in last 24 hours: Temp:  [97.7 F (36.5 C)-99.1 F (37.3 C)] 99.1 F (37.3 C) (05/17 0612) Pulse Rate:  [88-108] 88  (05/17 0612) Resp:  [18-24] 20  (05/17 0612) BP: (115-152)/(59-86) 115/70 mmHg (05/17 0612) SpO2:  [96 %-100 %] 96 % (05/17 0612) Last BM Date: 05/29/11 General:    Pleasant black female in NAD Heart:  Regular rate and rhythm Lungs: Respirations even and unlabored, lungs CTA bilaterally Abdomen:  Soft, nontender and nondistended. Normal bowel sounds. Extremities:  Without edema. Neurologic:  Alert and oriented,  grossly normal neurologically. Psych:  Cooperative. Normal mood and affect.   Lab Results:  Basename 05/30/11 0450 05/29/11 1435 05/29/11 0504  WBC 8.0 11.2* 10.1  HGB 8.2* 8.7* 7.7*  HCT 25.6* 27.0* 24.2*  PLT 295 299 335   BMET  Basename 05/30/11 0450 05/29/11 0504 05/28/11 0745  NA 142 141 141  K 4.0 3.2* 3.5  CL 110 108 105  CO2 20 21 24   GLUCOSE 80 70 79  BUN 6 12 24*  CREATININE 0.61 0.60 0.64  CALCIUM 8.5 8.4 8.7      ASSESSMENT / PLAN:  1. 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 NO further bleeding, normal BM this am. Hopefully home in am    2. Colon polyp, path pending.    3. Anemia of acute blood loss, s/p 1 unit of blood yesterday. Hemoglobin up to 8.2 this am.  She also got iron infusion yesterday.     LOS: 3 days   Willette Cluster  05/30/2011, 9:23 AM   [facility] Attending  I have also seen and assessed the patient and agree with the above note. Bleeding appears to have stopped with some decline in Hgb that I think is equilibration. I explained the nature of this atypical AVM to patient and daughter as best I could. I anticipate but cannot guarantee that the intervention yesterday is curative.  If she needs anti-platelet therapy think it could be  started in 2-4 weeks, depending upon the indication.  We will see check her tomorrow if still here when rounding. GI specific follow-up not needed - as long as no more bleeding does not need to see [place] except for polyp follow-up, etc. I will notify re: polyp pathology and plans.  Thanks.  Iva Boop, MD, [facility] [phone] (pager) 05/30/2011 11:23 AM

## 2011-05-31 LAB — BASIC METABOLIC PANEL
BUN: 9 mg/dL (ref 6–23)
Calcium: 9 mg/dL (ref 8.4–10.5)
Creatinine, Ser: 0.69 mg/dL (ref 0.50–1.10)
GFR calc non Af Amer: 86 mL/min — ABNORMAL LOW (ref >90)
Glucose, Bld: 82 mg/dL (ref 70–99)
Potassium: 3.8 mEq/L (ref 3.5–5.1)

## 2011-05-31 LAB — CBC
HCT: 27.4 % — ABNORMAL LOW (ref 36.0–46.0)
Hemoglobin: 8.8 g/dL — ABNORMAL LOW (ref 12.0–15.0)
MCH: 26.1 pg (ref 26.0–34.0)
MCHC: 32.1 g/dL (ref 30.0–36.0)
MCV: 81.3 fL (ref 78.0–100.0)

## 2011-05-31 MED ORDER — SIMETHICONE 80 MG PO CHEW: 80.0000 mg | CHEWABLE_TABLET | Freq: Four times a day (QID) | ORAL | Status: DC | PRN

## 2011-05-31 MED ORDER — ALUM & MAG HYDROXIDE-SIMETH 200-200-20 MG/5ML PO SUSP: 30.0000 mL | ORAL | Status: AC | PRN

## 2011-05-31 NOTE — Discharge Summary (Signed)
DISCHARGE SUMMARY  [name] [name]  MR#: [id number]  DOB:Apr 26, 1940  Date of Admission: 05/27/2011 Date of Discharge: 05/31/2011  Attending Physician:Seamus Warehime R  Patient's WUJ:WJXB,J DOUGLAS, MD, MD  Consults:Treatment Team:  Kari Baars, MD Iva Boop, MD  Discharge Diagnoses: Principal Problem:  *Hematochezia Active Problems:  HYPERTENSION  Polymyositis  Adrenal insufficiency  Acute blood loss anemia  Cecal ulcer with hemorrhage  Abdominal pain, left lower quadrant  AVM (arteriovenous malformation) of colon with hemorrhage   Discharge Medications: Medication List  As of 05/31/2011 10:25 AM   TAKE these medications         acetaminophen 500 MG tablet   Commonly known as: TYLENOL   Take 1,000 mg by mouth every 6 (six) hours as needed. For pain.      ALPRAZolam 0.25 MG tablet   Commonly known as: XANAX   Take 0.25 mg by mouth 3 (three) times daily as needed. Anxiety and sleep      alum & mag hydroxide-simeth 200-200-20 MG/5ML suspension   Commonly known as: MAALOX/MYLANTA   Take 30 mLs by mouth every 4 (four) hours as needed (gas).      atenolol 50 MG tablet   Commonly known as: TENORMIN   Take 50 mg by mouth daily.      CALTRATE 600 1500 MG Tabs   Generic drug: Calcium Carbonate   Take 2 tablets by mouth daily. Daily        dicyclomine 10 MG capsule   Commonly known as: BENTYL   Take 1 capsule (10 mg total) by mouth 2 (two) times daily.      ergocalciferol 50000 UNITS capsule   Commonly known as: VITAMIN D2   Take 50,000 Units by mouth 2 (two) times a week. Takes 1 tablet every Monday and friday      esomeprazole 40 MG capsule   Commonly known as: NEXIUM   Take 40 mg by mouth 2 (two) times daily.      folic acid 400 MCG tablet   Commonly known as: FOLVITE   Take 400 mcg by mouth daily.      furosemide 40 MG tablet   Commonly known as: LASIX   Take 40 mg by mouth daily.      loratadine 10 MG tablet   Commonly known as: CLARITIN   Take 10 mg by mouth daily as needed. For allergies.      potassium chloride SA 20 MEQ tablet   Commonly known as: K-DUR,KLOR-CON   Take 40 mEq by mouth daily.      predniSONE 10 MG tablet   Commonly known as: DELTASONE   Take 10 mg by mouth daily.      psyllium 95 % Pack   Commonly known as: HYDROCIL/METAMUCIL   Take 1 packet by mouth daily.      simethicone 80 MG chewable tablet   Commonly known as: MYLICON   Chew 1 tablet (80 mg total) by mouth 4 (four) times daily as needed for flatulence.      sodium chloride 0.65 % nasal spray   Commonly known as: OCEAN   Place 1 spray into the nose as needed. For allergies.      vitamin B-12 1000 MCG tablet   Commonly known as: CYANOCOBALAMIN   Take 1,000 mcg by mouth daily.      vitamin C 1000 MG tablet   Take 1,000 mg by mouth daily.      vitamin E 600 UNIT capsule   Take 600 Units  by mouth daily.            History of Present Illness: Is a 71 year old African American female with a history of recurrent lower GI bleed secondary to a cecal ulcer (12/12) and possible diverticulosis, as well as a history of an upper GI bleed secondary to GI ST (8/08) who presented to the emergency department with recurrent right red blood per rectum. Patient has been hospitalized 11/12, 12/12, and recently from 4/30-05/15/11 for recurrent hematochezia. In 12/12 she was found have a cecal ulcer. She did require transfusion at that time. She had no additional bleeding until her recent hospitalization. That episode was characterized by minimal bright red blood parenchyma that resolved promptly. She was evaluating by gastroenterology during that hospitalization who did not recommend additional workup at that time. It was felt that this may been diverticular in nature. Patient returned home and has been doing well until this morning when she awoke at 4:30. She first noted a little blood in her bowel movement shortly after awakening. About 2 hours later after  eating she had some lower, cramping followed by a large amount of bright red blood per rectum. She called her office and was referred to the emergency department. Since arrival in the emergency Department she's had one additional episode of bright red blood per rectum. In the past, she has not had the degree of abdominal cramping she currently has. She has had a persistent left upper quadrant abdominal pain for the past several months it waxes and wanes but is not very severe.    Hospital Course: Principal Problem:  *Hematochezia status post colonoscopy by Dr. Leone Payor, thought to be secondary to cecal AVM/ulcer with hemorrhage, status post clipping of a cecal AVM, now clinically resolved requiring packed red blood cell transfusion. Active Problems:  HYPERTENSION-controlled after reinitiation of beta blocker-atenolol  Polymyositis -prednisone-dependent, complicated by adrenal insufficiency, currently asymptomatic  Adrenal insufficiency-hemodynamically stable, will decrease higher dose of steroids administered during hospitalization down to normal dose of 10 mg at home.  Acute blood loss anemia-now stable, recheck hemoglobin 8.8 after packed red blood cell transfusion and iron infusion, hemoglobin the previous 24 hours was 8.2 after transfusion.  Cecal ulcer with hemorrhage-see above, now status post clipping  Abdominal pain, left lower quadrant-resolving however complicated by significant gas formation, will continue gas reduction agents  Studies/Results:  Ct Abdomen Pelvis W Contrast  05/28/2011 *RADIOLOGY REPORT* Clinical Data: Left lower quadrant abdominal pain. CT ABDOMEN AND PELVIS WITH CONTRAST Technique: Multidetector CT imaging of the abdomen and pelvis was performed following the standard protocol during bolus administration of intravenous contrast. Contrast: OMNIPAQUE IOHEXOL 300 MG/ML SOLN Comparison: 03/31/2009 Findings: Stable scarring noted at the right base. No focal abnormalities  seen in the liver or spleen. The stomach, duodenum, pancreas, gallbladder, and adrenal glands are unremarkable. Left kidney is unremarkable. 18 mm cyst is identified in the upper pole of the right kidney, without substantial change. No abdominal aortic aneurysm. There is no free fluid or lymphadenopathy in the abdomen. The abdominal bowel loops are unremarkable. No free fluid is seen in the pelvis. There is no pelvic sidewall lymphadenopathy. Bladder is unremarkable. Uterus is surgically absent. There is no adnexal mass. Diverticular changes are seen in the left colon without diverticulitis. Diverticular changes are seen in the terminal ileum which is otherwise unremarkable. The appendix is not visualized, but there is no edema or inflammation in the region of the cecum. The patient is status post PLIF at L4-5. IMPRESSION: Stable exam.  No acute findings in the abdomen or pelvis. Original Report Authenticated By: ERIC A. MANSELL, M.D.   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 - treated with APC  and 3 clips  2) Diminutive polyp in the proximal transverse colon - removed  3) Moderate diverticulosis  4) Otherwise normal examination - exellent prep   Day of Discharge Exam BP 148/66  Pulse 72  Temp(Src) 98.8 F (37.1 C) (Oral)  Resp 18  Ht 5\' 3"  (1.6 m)  Wt 88.3 kg (194 lb 10.7 oz)  BMI 34.48 kg/m2  SpO2 96%  Physical Exam: No apparent distress, sitting up in bed, conversant, alert and oriented x3, following all directions, main complaint is gas No oropharyngeal lesions X line neck supple, no cervical lymphadenopathy Lungs clear to auscultation bilaterally Cardiovascular exam reveals a regular rate and rhythm without murmurs appreciated Abdomen soft, nontender, nondistended, bowel sounds present No clubbing cyanosis or edema, no evidence of peripheral ischemia  Discharge Labs:  [facility] 05/31/11 0544 05/30/11 0450  NA 140 142  K 3.8 4.0  CL 108 110  CO2 20 20    GLUCOSE 82 80  BUN 9 6  CREATININE 0.69 0.61  CALCIUM 9.0 8.5  MG -- --  PHOS -- --   No results found for this basename: AST:2,ALT:2,ALKPHOS:2,BILITOT:2,PROT:2,ALBUMIN:2 in the last 72 hours  Basename 05/31/11 0544 05/30/11 0450  WBC 9.5 8.0  NEUTROABS -- --  HGB 8.8* 8.2*  HCT 27.4* 25.6*  MCV 81.3 81.0  PLT 324 295   No results found for this basename: CKTOTAL:3,CKMB:3,CKMBINDEX:3,TROPONINI:3 in the last 72 hours No results found for this basename: TSH,T4TOTAL,FREET3,T3FREE,THYROIDAB in the last 72 hours  Basename 05/30/11 0807  VITAMINB12 --  FOLATE --  FERRITIN 68  TIBC 266  IRON 55  RETICCTPCT --    Discharge instructions: Discharge Orders    Future Orders Please Complete By Expires   Diet - low sodium heart healthy      Increase activity slowly      Discharge instructions      Comments:   Continue to use anti-gas medications as according to your list, resume normal home medications, call immediately if you have recurrent GI bleeding, dizziness, fevers, chills or increasing abdominal pain   Call MD for:  temperature >100.4      Call MD for:  persistant nausea and vomiting      Call MD for:  severe uncontrolled pain         Disposition: Discharge home Follow-up Appts: Follow-up with Dr. Clelia Croft at [facility] in 1 week  Call for appointment.  Condition on Discharge: Improved  Tests Needing Follow-up: CBC, iron level  Greater than 30 minutes spent in evaluating the patient, preparing discharge paperwork, and prescriptions.  SignedHoyle Sauer 05/31/2011, 10:25 AM

## 2011-07-03 ENCOUNTER — Encounter (HOSPITAL_COMMUNITY)
Admission: RE | Admit: 2011-07-03 | Discharge: 2011-07-03 | Disposition: A | Discharge status: Home / Self Care | Payer: Medicare Other | Source: Ambulatory Visit | Attending: Rheumatology | Admitting: Rheumatology

## 2011-07-03 NOTE — Progress Notes (Signed)
Notified Bea Graff, RN at Dr. Ewell Poe office that pt states that she has not had a TB test since 1998 and had recent colonoscopy with polyp removal. Orders received. Patient and daughter aware.

## 2011-07-14 ENCOUNTER — Other Ambulatory Visit (HOSPITAL_COMMUNITY): Payer: Self-pay | Admitting: *Deleted

## 2011-07-15 ENCOUNTER — Encounter (HOSPITAL_COMMUNITY)
Admission: RE | Admit: 2011-07-15 | Discharge: 2011-07-15 | Disposition: A | Discharge status: Home / Self Care | Payer: Medicare Other | Source: Ambulatory Visit | Attending: Rheumatology | Admitting: Rheumatology

## 2011-07-15 DIAGNOSIS — Q2733 Arteriovenous malformation of digestive system vessel: Secondary | ICD-10-CM | POA: Insufficient documentation

## 2011-07-15 DIAGNOSIS — K219 Gastro-esophageal reflux disease without esophagitis: Secondary | ICD-10-CM | POA: Insufficient documentation

## 2011-07-15 DIAGNOSIS — M810 Age-related osteoporosis without current pathological fracture: Secondary | ICD-10-CM | POA: Insufficient documentation

## 2011-07-15 DIAGNOSIS — J99 Respiratory disorders in diseases classified elsewhere: Secondary | ICD-10-CM | POA: Insufficient documentation

## 2011-07-15 DIAGNOSIS — I10 Essential (primary) hypertension: Secondary | ICD-10-CM | POA: Insufficient documentation

## 2011-07-15 DIAGNOSIS — Z860100 Personal history of colon polyps, unspecified: Secondary | ICD-10-CM | POA: Insufficient documentation

## 2011-07-15 DIAGNOSIS — K319 Disease of stomach and duodenum, unspecified: Secondary | ICD-10-CM | POA: Insufficient documentation

## 2011-07-15 DIAGNOSIS — K633 Ulcer of intestine: Secondary | ICD-10-CM | POA: Insufficient documentation

## 2011-07-15 DIAGNOSIS — M3320 Polymyositis, organ involvement unspecified: Secondary | ICD-10-CM | POA: Insufficient documentation

## 2011-07-15 MED ORDER — SODIUM CHLORIDE 0.9 % IV SOLN
1000.0000 mg | INTRAVENOUS | Status: DC
  Administered 2011-07-15: 1000 mg via INTRAVENOUS
  Filled 2011-07-15: qty 100

## 2011-07-15 MED ORDER — METHYLPREDNISOLONE SODIUM SUCC 125 MG IJ SOLR
100.0000 mg | INTRAMUSCULAR | Status: DC
  Administered 2011-07-15: 100 mg via INTRAVENOUS
  Filled 2011-07-15: qty 2

## 2011-07-15 MED ORDER — SODIUM CHLORIDE 0.9 % IV SOLN
Freq: Once | INTRAVENOUS | Status: AC
  Administered 2011-07-15: 10:00:00 via INTRAVENOUS

## 2011-07-29 ENCOUNTER — Encounter (HOSPITAL_COMMUNITY)
Admission: RE | Admit: 2011-07-29 | Discharge: 2011-07-29 | Disposition: A | Discharge status: Home / Self Care | Payer: Medicare Other | Source: Ambulatory Visit | Attending: Rheumatology | Admitting: Rheumatology

## 2011-07-29 MED ORDER — METHYLPREDNISOLONE SODIUM SUCC 125 MG IJ SOLR
100.0000 mg | INTRAMUSCULAR | Status: AC
  Administered 2011-07-29: 100 mg via INTRAVENOUS
  Filled 2011-07-29: qty 2

## 2011-07-29 MED ORDER — SODIUM CHLORIDE 0.9 % IV SOLN
Freq: Once | INTRAVENOUS | Status: AC
  Administered 2011-07-29: 11:00:00 via INTRAVENOUS

## 2011-07-29 MED ORDER — SODIUM CHLORIDE 0.9 % IV SOLN
1000.0000 mg | INTRAVENOUS | Status: AC
  Administered 2011-07-29: 1000 mg via INTRAVENOUS
  Filled 2011-07-29: qty 100

## 2011-09-16 ENCOUNTER — Telehealth: Payer: Self-pay | Admitting: Internal Medicine

## 2011-09-16 NOTE — Telephone Encounter (Signed)
I spoke with pt and is aware to come in 10/07/11 at 11:00. Nothing further was needed

## 2011-09-16 NOTE — Telephone Encounter (Signed)
Dr. Maple Hudson had openings this afternoon but stated she did not have a ride to bring her. She is scheduled to come in and see Dr. Maple Hudson on 10/28/11. She has not been seen since 2008. She stated she has been having a lot of allergy problems and is wanting to know if she could be worked in sooner. Please advise thanks

## 2011-09-16 NOTE — Telephone Encounter (Signed)
Please see if patient can come in on 10-07-11 at 11 am for 11:15 am consult.

## 2011-09-18 ENCOUNTER — Institutional Professional Consult (permissible substitution): Payer: Medicare Other | Admitting: Internal Medicine

## 2011-10-07 ENCOUNTER — Ambulatory Visit (INDEPENDENT_AMBULATORY_CARE_PROVIDER_SITE_OTHER): Payer: Medicare Other | Admitting: Internal Medicine

## 2011-10-07 ENCOUNTER — Encounter: Payer: Self-pay | Admitting: Internal Medicine

## 2011-10-07 VITALS — BP 118/70 | HR 79 | Ht 63.0 in

## 2011-10-07 DIAGNOSIS — J309 Allergic rhinitis, unspecified: Secondary | ICD-10-CM

## 2011-10-07 DIAGNOSIS — Z23 Encounter for immunization: Secondary | ICD-10-CM

## 2011-10-07 MED ORDER — PHENYLEPHRINE HCL 1 % NA SOLN
3.0000 [drp] | Freq: Once | NASAL | Status: AC
  Administered 2011-10-07: 3 [drp] via NASAL

## 2011-10-07 MED ORDER — METHYLPREDNISOLONE ACETATE 80 MG/ML IJ SUSP
80.0000 mg | Freq: Once | INTRAMUSCULAR | Status: AC
  Administered 2011-10-07: 80 mg via INTRAMUSCULAR

## 2011-10-07 NOTE — Patient Instructions (Addendum)
Neb neo nasal  Depo 80  Sample Dymista nasal spray     1-2 puffs each nostril once daily at bedtime  Flu vax

## 2011-10-07 NOTE — Progress Notes (Signed)
10/07/11- 71 yoF never smoker Former patient-2008- week history of allergic rhinitis, mild intermittent asthma, weakness from polymyositis on chronic steroid therapy.  PCP Dr Jamse Mead. Dr Kathie Rhodes. [facility]/ [facility]. She returns now to reestablish, asking if allergy is causing pain across eyes, left ear, also burning of nose and watery rhinorrhea. Chest rattles without productive cough. Has felt dizzy for 3 weeks, lightheaded more than vertigo. She took Claritin, then Claritin-D but they make her "dizzy". Mucinex with phenylephrine with some help. She denies chest discomfort or wheeze.  Prior to Admission medications   Medication Sig Start Date End Date Taking? Authorizing Provider  acetaminophen (TYLENOL) 500 MG tablet Take 1,000 mg by mouth every 6 (six) hours as needed. For pain.   Yes Historical Provider, MD  ALPRAZolam (XANAX) 0.25 MG tablet Take 0.25 mg by mouth 3 (three) times daily as needed. Anxiety and sleep   Yes Historical Provider, MD  Ascorbic Acid (VITAMIN C) 1000 MG tablet Take 1,000 mg by mouth daily.    Yes Historical Provider, MD  atenolol (TENORMIN) 50 MG tablet Take 50 mg by mouth daily.     Yes Historical Provider, MD  Calcium Carbonate (CALTRATE 600) 1500 MG TABS Take 2 tablets by mouth daily. Daily    Yes Historical Provider, MD  ergocalciferol (VITAMIN D2) 50000 UNITS capsule Take 50,000 Units by mouth 2 (two) times a week. Takes 1 tablet every Monday and friday   Yes Historical Provider, MD  esomeprazole (NEXIUM) 40 MG capsule Take 40 mg by mouth daily before breakfast.    Yes Historical Provider, MD  fluticasone (FLONASE) 50 MCG/ACT nasal spray Place 1 spray into both nostrils daily. 08/06/11  Yes Historical Provider, MD  folic acid (FOLVITE) 400 MCG tablet Take 400 mcg by mouth daily.     Yes Historical Provider, MD  furosemide (LASIX) 40 MG tablet Take 40 mg by mouth daily.     Yes Historical Provider, MD  loratadine (CLARITIN) 10 MG tablet Take 10 mg by mouth daily as needed. For  allergies.   Yes Historical Provider, MD  potassium chloride SA (K-DUR,KLOR-CON) 20 MEQ tablet Take 40 mEq by mouth daily.    Yes Historical Provider, MD  predniSONE (DELTASONE) 10 MG tablet Take 10 mg by mouth daily.   Yes Historical Provider, MD  psyllium (HYDROCIL/METAMUCIL) 95 % PACK Take 1 packet by mouth daily. 05/15/11  Yes W Buren Kos, MD  sodium chloride (OCEAN) 0.65 % nasal spray Place 1 spray into the nose as needed. For allergies.   Yes Historical Provider, MD  vitamin B-12 (CYANOCOBALAMIN) 1000 MCG tablet Take 1,000 mcg by mouth daily.     Yes Historical Provider, MD  vitamin E 600 UNIT capsule Take 600 Units by mouth daily.     Yes Historical Provider, MD   Past Medical History  Diagnosis Date  . Benign neoplasm of colon   . Esophageal reflux   . Polymyositis   . Allergic rhinitis, cause unspecified   . Unspecified essential hypertension   . Hyperlipidemia   . Osteoporosis   . Vitamin d deficiency   . GI hemorrhage     recurrent  . 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 Lumbar laminectomy   . Tonsillectomy   . Bilateral  rotator cuff surgery   . Esophagogastroduodenoscopy 12/08/2010    others also  . Abdominal hysterectomy     partial  . Colonoscopy 01/09/2011    others  also  . Eus   . Colonoscopy 05/29/2011    Procedure: COLONOSCOPY;  Surgeon: Iva Boop, MD;  Location: [facility];  Service: Endoscopy;  Laterality: N/A;  Fawn Kirk Leone Payor  . Hot hemostasis 05/29/2011    Procedure: HOT HEMOSTASIS (ARGON PLASMA COAGULATION/BICAP);  Surgeon: Iva Boop, MD;  Location: [facility];  Service: Endoscopy;  Laterality: N/A;   Family History  Problem Relation Age of Onset  . Dementia Mother   . Hypertension Father   . Malignant hyperthermia Father    History   Social History  . Marital Status: Legally Separated    Spouse Name: N/A    Number of Children: 5  . Years of Education: N/A   Occupational History    . retired     Social History Main Topics  . Smoking status: Never Smoker   . Smokeless tobacco: Current User    Types: Snuff  . Alcohol Use: No  . Drug Use: No  . Sexually Active: No   Other Topics Concern  . Not on file   Social History Narrative  . No narrative on file   ROS-see HPI Constitutional:   No-   weight loss, night sweats, fevers, chills, fatigue, lassitude. HEENT:   +headaches, no-difficulty swallowing, tooth/dental problems, sore throat,       No-  sneezing, itching, ear ache, + nasal congestion, post nasal drip,  CV:  No-   chest pain, orthopnea, PND, swelling in lower extremities, anasarca, + he no-dizziness, palpitations Resp: No-   shortness of breath with exertion or at rest.              No-   productive cough,  No non-productive cough,  No- coughing up of blood.              No-   change in color of mucus.  No- wheezing.   Skin: No-   rash or lesions. GI:  No-   heartburn, indigestion, abdominal pain, nausea, vomiting, diarrhea,                 change in bowel habits, loss of appetite GU: No-   dysuria, change in color of urine, no urgency or frequency.  No- flank pain. MS:  No-   joint pain or swelling. + decreased range of motion.  No- back pain. Neuro-     + chronic weakness Psych:  No- change in mood or affect. No depression or anxiety.  No memory loss.  OBJ- Physical Exam General- Alert, Oriented, Affect-appropriate, Distress- none acute. Wheelchair Skin- rash-none, lesions- none, excoriation- none Lymphadenopathy- none Head- atraumatic            Eyes- Gross vision intact, PERRLA, conjunctivae and secretions clear            Ears- Hearing, canals-cerumen            Nose- + pale mucosa, no-Septal dev, mucus, polyps, erosion, perforation             Throat- Mallampati IV , mucosa clear , drainage- none, tonsils- atrophic Neck- flexible , trachea midline, no stridor , thyroid nl, carotid no bruit Chest - symmetrical excursion , unlabored            Heart/CV- RRR , no murmur , no gallop  , no rub, nl s1 s2                           - JVD- none ,  edema- none, stasis changes- none, varices- none           Lung- clear to P&A, wheeze- none, cough- none , dullness-none, rub- none           Chest wall-  Abd- tender-no, distended-no, bowel sounds-present, HSM- no Br/ Gen/ Rectal- Not done, not indicated Extrem- cyanosis- none, clubbing, none, atrophy- none, strength- nl Neuro- grossly intact to observation

## 2011-10-15 NOTE — Assessment & Plan Note (Signed)
She has had history of allergic rhinitis not sure how that relates to her headache pain unless she is trying to describe increased sinus pressure Plan-nasal nebulizer, Depomedrol., Sample Dymista

## 2011-10-17 ENCOUNTER — Telehealth: Payer: Self-pay | Admitting: Internal Medicine

## 2011-10-17 MED ORDER — AMOXICILLIN-POT CLAVULANATE 500-125 MG PO TABS: 1.0000 | ORAL_TABLET | Freq: Two times a day (BID) | ORAL | Status: DC

## 2011-10-17 NOTE — Telephone Encounter (Signed)
Called, spoke with pt.  C/o left ear pain, runny nose with clear drainage, and having a HA everyday.  States the depo given during last OV on 10/07/11 did not help.  Is using dymista with some relief.  Requesting any further recs -- Dr. Maple Hudson, pls advise.  Thank you.  [facility] [address]  Allergies verified: Allergies  Allergen Reactions  . Codeine Other (See Comments)    Feels drunk

## 2011-10-17 NOTE — Telephone Encounter (Signed)
Called, spoke with pt.  Informed her of below recs per Dr. Maple Hudson.  She verbalized understanding of this, is aware rx sent to [facility] [address], and will call back if symptoms do not improve or worsen.

## 2011-10-17 NOTE — Telephone Encounter (Signed)
Per CY-okay to give Augmentin 500 mg #14 take 1 po bid no refills. 

## 2011-10-28 ENCOUNTER — Institutional Professional Consult (permissible substitution): Payer: Medicare Other | Admitting: Internal Medicine

## 2011-10-31 ENCOUNTER — Encounter (HOSPITAL_COMMUNITY): Payer: Medicare Other

## 2011-11-25 ENCOUNTER — Ambulatory Visit: Payer: Medicare Other | Attending: Rheumatology | Admitting: Physical Therapy

## 2011-11-25 DIAGNOSIS — R5381 Other malaise: Secondary | ICD-10-CM | POA: Insufficient documentation

## 2011-11-25 DIAGNOSIS — R262 Difficulty in walking, not elsewhere classified: Secondary | ICD-10-CM | POA: Insufficient documentation

## 2011-11-25 DIAGNOSIS — IMO0001 Reserved for inherently not codable concepts without codable children: Secondary | ICD-10-CM | POA: Insufficient documentation

## 2011-11-25 DIAGNOSIS — M6281 Muscle weakness (generalized): Secondary | ICD-10-CM | POA: Insufficient documentation

## 2011-11-27 ENCOUNTER — Ambulatory Visit: Payer: Medicare Other | Admitting: Physical Therapy

## 2011-12-02 ENCOUNTER — Ambulatory Visit: Payer: Medicare Other | Admitting: Physical Therapy

## 2011-12-04 ENCOUNTER — Ambulatory Visit: Payer: Medicare Other | Admitting: Physical Therapy

## 2011-12-09 ENCOUNTER — Ambulatory Visit: Payer: Medicare Other | Admitting: Physical Therapy

## 2011-12-16 ENCOUNTER — Ambulatory Visit: Payer: Medicare Other | Attending: Rheumatology | Admitting: Physical Therapy

## 2011-12-16 DIAGNOSIS — R262 Difficulty in walking, not elsewhere classified: Secondary | ICD-10-CM | POA: Insufficient documentation

## 2011-12-16 DIAGNOSIS — IMO0001 Reserved for inherently not codable concepts without codable children: Secondary | ICD-10-CM | POA: Insufficient documentation

## 2011-12-16 DIAGNOSIS — M6281 Muscle weakness (generalized): Secondary | ICD-10-CM | POA: Insufficient documentation

## 2011-12-16 DIAGNOSIS — R5381 Other malaise: Secondary | ICD-10-CM | POA: Insufficient documentation

## 2011-12-18 ENCOUNTER — Ambulatory Visit: Payer: Medicare Other | Admitting: Physical Therapy

## 2011-12-23 ENCOUNTER — Ambulatory Visit: Payer: Medicare Other | Admitting: Physical Therapy

## 2011-12-25 ENCOUNTER — Ambulatory Visit: Payer: Medicare Other | Admitting: Physical Therapy

## 2011-12-30 ENCOUNTER — Ambulatory Visit: Payer: Medicare Other | Admitting: Physical Therapy

## 2012-01-05 ENCOUNTER — Other Ambulatory Visit (HOSPITAL_COMMUNITY): Payer: Self-pay | Admitting: Internal Medicine

## 2012-01-08 ENCOUNTER — Ambulatory Visit: Payer: Medicare Other | Admitting: Physical Therapy

## 2012-01-12 ENCOUNTER — Encounter (HOSPITAL_COMMUNITY): Payer: Self-pay

## 2012-01-12 ENCOUNTER — Ambulatory Visit (HOSPITAL_COMMUNITY)
Admission: RE | Admit: 2012-01-12 | Discharge: 2012-01-12 | Disposition: A | Discharge status: Home / Self Care | Payer: Medicare Other | Source: Ambulatory Visit | Attending: Internal Medicine | Admitting: Internal Medicine

## 2012-01-12 DIAGNOSIS — M810 Age-related osteoporosis without current pathological fracture: Secondary | ICD-10-CM | POA: Insufficient documentation

## 2012-01-12 DIAGNOSIS — J309 Allergic rhinitis, unspecified: Secondary | ICD-10-CM | POA: Insufficient documentation

## 2012-01-12 DIAGNOSIS — K219 Gastro-esophageal reflux disease without esophagitis: Secondary | ICD-10-CM | POA: Insufficient documentation

## 2012-01-12 DIAGNOSIS — M3320 Polymyositis, organ involvement unspecified: Secondary | ICD-10-CM | POA: Insufficient documentation

## 2012-01-12 DIAGNOSIS — E559 Vitamin D deficiency, unspecified: Secondary | ICD-10-CM | POA: Insufficient documentation

## 2012-01-12 DIAGNOSIS — D126 Benign neoplasm of colon, unspecified: Secondary | ICD-10-CM | POA: Insufficient documentation

## 2012-01-12 DIAGNOSIS — I10 Essential (primary) hypertension: Secondary | ICD-10-CM | POA: Insufficient documentation

## 2012-01-12 MED ORDER — ZOLEDRONIC ACID 5 MG/100ML IV SOLN
5.0000 mg | Freq: Once | INTRAVENOUS | Status: AC
  Administered 2012-01-12: 5 mg via INTRAVENOUS
  Filled 2012-01-12: qty 100

## 2012-01-12 MED ORDER — SODIUM CHLORIDE 0.9 % IV SOLN
Freq: Once | INTRAVENOUS | Status: AC
  Administered 2012-01-12: 11:00:00 via INTRAVENOUS

## 2012-01-13 ENCOUNTER — Ambulatory Visit: Payer: Medicare Other | Admitting: Physical Therapy

## 2012-01-15 ENCOUNTER — Ambulatory Visit: Payer: Medicare Other | Admitting: Physical Therapy

## 2012-01-20 ENCOUNTER — Encounter (HOSPITAL_COMMUNITY): Payer: Medicare Other

## 2012-01-22 ENCOUNTER — Other Ambulatory Visit (HOSPITAL_COMMUNITY): Payer: Self-pay | Admitting: *Deleted

## 2012-01-23 ENCOUNTER — Encounter (HOSPITAL_COMMUNITY)
Admission: RE | Admit: 2012-01-23 | Discharge: 2012-01-23 | Disposition: A | Discharge status: Home / Self Care | Payer: Medicare Other | Source: Ambulatory Visit | Attending: Rheumatology | Admitting: Rheumatology

## 2012-01-23 DIAGNOSIS — D649 Anemia, unspecified: Secondary | ICD-10-CM | POA: Insufficient documentation

## 2012-01-23 MED ORDER — METHYLPREDNISOLONE SODIUM SUCC 125 MG IJ SOLR
INTRAMUSCULAR | Status: AC
  Administered 2012-01-23: 100 mg via INTRAVENOUS
  Filled 2012-01-23: qty 2

## 2012-01-23 MED ORDER — METHYLPREDNISOLONE SODIUM SUCC 125 MG IJ SOLR
100.0000 mg | INTRAMUSCULAR | Status: DC
  Administered 2012-01-23: 100 mg via INTRAVENOUS

## 2012-01-23 MED ORDER — SODIUM CHLORIDE 0.9 % IV SOLN
1000.0000 mg | INTRAVENOUS | Status: DC
  Administered 2012-01-23: 1000 mg via INTRAVENOUS
  Filled 2012-01-23: qty 100

## 2012-01-23 MED ORDER — SODIUM CHLORIDE 0.9 % IV SOLN
INTRAVENOUS | Status: DC
  Administered 2012-01-23: 250 mL via INTRAVENOUS

## 2012-01-23 NOTE — Progress Notes (Signed)
States she took Zyrtek 10 mgm last pm at home

## 2012-02-04 ENCOUNTER — Other Ambulatory Visit: Payer: Self-pay | Admitting: Internal Medicine

## 2012-02-04 DIAGNOSIS — Z1231 Encounter for screening mammogram for malignant neoplasm of breast: Secondary | ICD-10-CM

## 2012-02-06 ENCOUNTER — Encounter (HOSPITAL_COMMUNITY)
Admission: RE | Admit: 2012-02-06 | Discharge: 2012-02-06 | Disposition: A | Discharge status: Home / Self Care | Payer: Medicare Other | Source: Ambulatory Visit | Attending: Rheumatology | Admitting: Rheumatology

## 2012-02-06 MED ORDER — SODIUM CHLORIDE 0.9 % IV SOLN
INTRAVENOUS | Status: AC
  Administered 2012-02-06: 10:00:00 via INTRAVENOUS

## 2012-02-06 MED ORDER — METHYLPREDNISOLONE SODIUM SUCC 125 MG IJ SOLR: 100.0000 mg | INTRAMUSCULAR | Status: DC

## 2012-02-06 MED ORDER — METHYLPREDNISOLONE SODIUM SUCC 125 MG IJ SOLR
INTRAMUSCULAR | Status: AC
  Administered 2012-02-06: 100 mg
  Filled 2012-02-06: qty 2

## 2012-02-06 MED ORDER — SODIUM CHLORIDE 0.9 % IV SOLN
1000.0000 mg | INTRAVENOUS | Status: DC
  Administered 2012-02-06: 1000 mg via INTRAVENOUS
  Filled 2012-02-06: qty 100

## 2012-02-25 ENCOUNTER — Telehealth: Payer: Self-pay | Admitting: Internal Medicine

## 2012-02-25 NOTE — Telephone Encounter (Signed)
I spoke with pt. She was calling and c/o HA. No other complaints. I advised pt she could take tylenol if no allergies. She voiced her understanding and needed nothing further

## 2012-03-09 ENCOUNTER — Ambulatory Visit: Payer: Medicare Other

## 2012-04-08 ENCOUNTER — Other Ambulatory Visit: Payer: Self-pay | Admitting: Specialist

## 2012-04-08 DIAGNOSIS — R51 Headache: Secondary | ICD-10-CM

## 2012-04-22 ENCOUNTER — Ambulatory Visit
Admission: RE | Admit: 2012-04-22 | Discharge: 2012-04-22 | Disposition: A | Discharge status: Home / Self Care | Payer: Medicare Other | Source: Ambulatory Visit | Attending: Specialist | Admitting: Specialist

## 2012-04-22 DIAGNOSIS — R51 Headache: Secondary | ICD-10-CM

## 2012-08-02 ENCOUNTER — Ambulatory Visit
Admission: RE | Admit: 2012-08-02 | Discharge: 2012-08-02 | Disposition: A | Discharge status: Home / Self Care | Payer: Medicare Other | Source: Ambulatory Visit | Attending: Internal Medicine | Admitting: Internal Medicine

## 2012-08-02 DIAGNOSIS — Z1231 Encounter for screening mammogram for malignant neoplasm of breast: Secondary | ICD-10-CM

## 2012-11-29 ENCOUNTER — Other Ambulatory Visit (HOSPITAL_COMMUNITY): Payer: Self-pay | Admitting: *Deleted

## 2012-11-30 ENCOUNTER — Encounter (HOSPITAL_COMMUNITY)
Admission: RE | Admit: 2012-11-30 | Discharge: 2012-11-30 | Disposition: A | Discharge status: Home / Self Care | Payer: Medicare Other | Source: Ambulatory Visit | Attending: Rheumatology | Admitting: Rheumatology

## 2012-11-30 DIAGNOSIS — M3320 Polymyositis, organ involvement unspecified: Secondary | ICD-10-CM | POA: Insufficient documentation

## 2012-11-30 MED ORDER — SODIUM CHLORIDE 0.9 % IV SOLN: INTRAVENOUS | Status: DC

## 2012-11-30 MED ORDER — SODIUM CHLORIDE 0.9 % IV SOLN
1000.0000 mg | INTRAVENOUS | Status: DC
  Filled 2012-11-30: qty 100

## 2012-11-30 NOTE — Progress Notes (Signed)
Called and spoke with Larita Fife, rn at dr anderson's office and reported that two IV team nurses attempted pt's IV and even with ultrasound were unable to get an IV site.  We rescheduled the pt to come back next week and advised her not to drink anything with caffeine, but to drink 8 8ounce glasses of water the day before her appt and water the morning of.

## 2012-12-07 ENCOUNTER — Encounter (HOSPITAL_COMMUNITY)
Admission: RE | Admit: 2012-12-07 | Discharge: 2012-12-07 | Disposition: A | Discharge status: Home / Self Care | Payer: Medicare Other | Source: Ambulatory Visit | Attending: Rheumatology | Admitting: Rheumatology

## 2012-12-07 NOTE — Progress Notes (Signed)
Two IV nurses unsuccessful at obtaining IV access. Called Dr. Ewell Poe office and spoke with Bea Graff, RN. Orders received. Patient not to get Rituxin today. Dr. Dareen Piano to call patient to discuss PICC line. Patient and daughter verbalize understanding.

## 2012-12-17 ENCOUNTER — Encounter (HOSPITAL_COMMUNITY)
Admission: RE | Admit: 2012-12-17 | Discharge: 2012-12-17 | Disposition: A | Discharge status: Home / Self Care | Payer: Medicare Other | Source: Ambulatory Visit | Attending: Rheumatology | Admitting: Rheumatology

## 2012-12-17 DIAGNOSIS — M3320 Polymyositis, organ involvement unspecified: Secondary | ICD-10-CM | POA: Insufficient documentation

## 2012-12-17 MED ORDER — METHYLPREDNISOLONE SODIUM SUCC 125 MG IJ SOLR
100.0000 mg | Freq: Once | INTRAMUSCULAR | Status: AC
  Administered 2012-12-17: 100 mg via INTRAVENOUS

## 2012-12-17 MED ORDER — SODIUM CHLORIDE 0.9 % IV SOLN
INTRAVENOUS | Status: DC
Start: 2012-12-17 — End: 2012-12-18
  Administered 2012-12-17: 10:00:00 via INTRAVENOUS

## 2012-12-17 MED ORDER — SODIUM CHLORIDE 0.9 % IV SOLN
1000.0000 mg | INTRAVENOUS | Status: DC
  Administered 2012-12-17: 1000 mg via INTRAVENOUS
  Filled 2012-12-17: qty 100

## 2012-12-31 ENCOUNTER — Encounter (HOSPITAL_COMMUNITY)
Admission: RE | Admit: 2012-12-31 | Discharge: 2012-12-31 | Disposition: A | Discharge status: Home / Self Care | Payer: Medicare Other | Source: Ambulatory Visit | Attending: Rheumatology | Admitting: Rheumatology

## 2012-12-31 MED ORDER — METHYLPREDNISOLONE SODIUM SUCC 125 MG IJ SOLR: 100.0000 mg | Freq: Once | INTRAMUSCULAR | Status: DC

## 2012-12-31 MED ORDER — SODIUM CHLORIDE 0.9 % IV SOLN: 1000.0000 mg | INTRAVENOUS | Status: DC

## 2012-12-31 MED ORDER — SODIUM CHLORIDE 0.9 % IV SOLN: INTRAVENOUS | Status: DC

## 2013-01-04 ENCOUNTER — Other Ambulatory Visit (HOSPITAL_COMMUNITY): Payer: Self-pay | Admitting: Rheumatology

## 2013-01-05 ENCOUNTER — Other Ambulatory Visit: Payer: Self-pay | Admitting: Radiology

## 2013-01-05 ENCOUNTER — Other Ambulatory Visit (HOSPITAL_COMMUNITY): Payer: Self-pay | Admitting: Rheumatology

## 2013-01-05 DIAGNOSIS — M069 Rheumatoid arthritis, unspecified: Secondary | ICD-10-CM

## 2013-01-10 ENCOUNTER — Encounter (HOSPITAL_COMMUNITY): Payer: Self-pay | Admitting: Pharmacy Technician

## 2013-01-11 ENCOUNTER — Other Ambulatory Visit (HOSPITAL_COMMUNITY): Payer: Self-pay | Admitting: Rheumatology

## 2013-01-11 ENCOUNTER — Ambulatory Visit (HOSPITAL_COMMUNITY)
Admission: RE | Admit: 2013-01-11 | Discharge: 2013-01-11 | Disposition: A | Discharge status: Home / Self Care | Payer: Medicare Other | Source: Ambulatory Visit | Attending: Rheumatology | Admitting: Rheumatology

## 2013-01-11 DIAGNOSIS — I10 Essential (primary) hypertension: Secondary | ICD-10-CM | POA: Insufficient documentation

## 2013-01-11 DIAGNOSIS — E785 Hyperlipidemia, unspecified: Secondary | ICD-10-CM | POA: Insufficient documentation

## 2013-01-11 DIAGNOSIS — M069 Rheumatoid arthritis, unspecified: Secondary | ICD-10-CM

## 2013-01-11 DIAGNOSIS — M3320 Polymyositis, organ involvement unspecified: Secondary | ICD-10-CM | POA: Insufficient documentation

## 2013-01-11 DIAGNOSIS — K219 Gastro-esophageal reflux disease without esophagitis: Secondary | ICD-10-CM | POA: Insufficient documentation

## 2013-01-11 DIAGNOSIS — M810 Age-related osteoporosis without current pathological fracture: Secondary | ICD-10-CM | POA: Insufficient documentation

## 2013-01-11 DIAGNOSIS — F17200 Nicotine dependence, unspecified, uncomplicated: Secondary | ICD-10-CM | POA: Insufficient documentation

## 2013-01-11 LAB — CBC
MCH: 26 pg (ref 26.0–34.0)
MCHC: 31.9 g/dL (ref 30.0–36.0)
MCV: 81.6 fL (ref 78.0–100.0)
Platelets: 267 10*3/uL (ref 150–400)
RBC: 4.23 MIL/uL (ref 3.87–5.11)
RDW: 17.5 % — ABNORMAL HIGH (ref 11.5–15.5)

## 2013-01-11 MED ORDER — FENTANYL CITRATE 0.05 MG/ML IJ SOLN
INTRAMUSCULAR | Status: AC | PRN
  Administered 2013-01-11 (×2): 50 ug via INTRAVENOUS

## 2013-01-11 MED ORDER — MIDAZOLAM HCL 2 MG/2ML IJ SOLN
INTRAMUSCULAR | Status: AC | PRN
  Administered 2013-01-11 (×2): 1 mg via INTRAVENOUS

## 2013-01-11 MED ORDER — CEFAZOLIN SODIUM-DEXTROSE 2-3 GM-% IV SOLR
2.0000 g | INTRAVENOUS | Status: AC
  Administered 2013-01-11: 2 g via INTRAVENOUS
  Filled 2013-01-11: qty 50

## 2013-01-11 MED ORDER — SODIUM CHLORIDE 0.9 % IV SOLN
INTRAVENOUS | Status: DC
  Administered 2013-01-11: 09:00:00 via INTRAVENOUS

## 2013-01-11 MED ORDER — FENTANYL CITRATE 0.05 MG/ML IJ SOLN
INTRAMUSCULAR | Status: AC
  Filled 2013-01-11: qty 4

## 2013-01-11 MED ORDER — MIDAZOLAM HCL 2 MG/2ML IJ SOLN
INTRAMUSCULAR | Status: AC
  Filled 2013-01-11: qty 4

## 2013-01-11 NOTE — Procedures (Signed)
Placement of right jugular portacath.  Tip at SVC/RA junction.  No immediate complication.   

## 2013-01-11 NOTE — H&P (Signed)
[name] [name] is an 72 y.o. female.   Chief Complaint: pt with medical hx of polymyosits Difficult IV access Scheduled for Port a cath placement for meds and fluids and blood draws  HPI: polymyosits; HLD; osteoporosis  Past Medical History  Diagnosis Date  . Benign neoplasm of colon   . Esophageal reflux   . Polymyositis   . Allergic rhinitis, cause unspecified   . Unspecified essential hypertension   . Hyperlipidemia   . Osteoporosis   . Vitamin D deficiency   . GI hemorrhage     recurrent  . 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 Lumbar laminectomy    . Tonsillectomy    . Bilateral  rotator cuff surgery    . Esophagogastroduodenoscopy  12/08/2010    others also  . Abdominal hysterectomy      partial  . Colonoscopy  01/09/2011    others also  . Eus    . Colonoscopy  05/29/2011    Procedure: COLONOSCOPY;  Surgeon: Iva Boop, MD;  Location: [facility];  Service: Endoscopy;  Laterality: N/A;  Fawn Kirk Leone Payor  . Hot hemostasis  05/29/2011    Procedure: HOT HEMOSTASIS (ARGON PLASMA COAGULATION/BICAP);  Surgeon: Iva Boop, MD;  Location: [facility];  Service: Endoscopy;  Laterality: N/A;    Family History  Problem Relation Age of Onset  . Dementia Mother   . Hypertension Father   . Malignant hyperthermia Father    Social History:  reports that she has never smoked. Her smokeless tobacco use includes Snuff. She reports that she does not drink alcohol or use illicit drugs.  Allergies:  Allergies  Allergen Reactions  . Codeine Other (See Comments)    Feels drunk  . Hydrocodone     Feels drunk     (Not in a hospital admission)  Results for orders placed during the hospital encounter of 01/11/13 (from the past 48 hour(s))  CBC     Status: Abnormal   Collection Time    01/11/13  7:30 AM      Result Value Range   WBC 8.7  4.0 - 10.5 K/uL   RBC 4.23  3.87 - 5.11 MIL/uL   Hemoglobin 11.0 (*)  12.0 - 15.0 g/dL   HCT 16.1 (*) 09.6 - 04.5 %   MCV 81.6  78.0 - 100.0 fL   MCH 26.0  26.0 - 34.0 pg   MCHC 31.9  30.0 - 36.0 g/dL   RDW 40.9 (*) 81.1 - 91.4 %   Platelets 267  150 - 400 K/uL   No results found.  Review of Systems  Constitutional: Positive for malaise/fatigue. Negative for fever.  Respiratory: Negative for shortness of breath.   Cardiovascular: Negative for chest pain.  Gastrointestinal: Negative for nausea, vomiting and abdominal pain.  Musculoskeletal: Positive for back pain, falls, joint pain, myalgias and neck pain.  Neurological: Positive for weakness.    Blood pressure 135/67, pulse 74, temperature 97.4 F (36.3 C), temperature source Oral, resp. rate 20, height 5\' 3"  (1.6 m), weight 180 lb (81.647 kg), SpO2 100.00%. Physical Exam  Constitutional: She is oriented to person, place, and time. She appears well-developed.  Cardiovascular: Normal rate, regular rhythm and normal heart sounds.   No murmur heard. Respiratory: Effort normal and breath sounds normal. She has no wheezes.  GI: Soft. Bowel sounds are normal. There is no tenderness.  Musculoskeletal: Normal range of motion.  Weakness;  painful; uses wc  Neurological: She is alert and oriented to person, place, and time.  Skin: Skin is warm and dry.  Psychiatric: She has a normal mood and affect. Her behavior is normal. Judgment and thought content normal.     Assessment/Plan PAC requested per Dr Dareen Piano Difficult IV access---needs treatment for polymyositis Pt aware of procedure benefits and risks and agreeable to proceed Consent signed and in chart  Antwyne Pingree A 01/11/2013, 9:12 AM

## 2013-01-11 NOTE — ED Notes (Signed)
Patient denies pain and is resting comfortably.  

## 2013-01-28 ENCOUNTER — Other Ambulatory Visit (HOSPITAL_COMMUNITY): Payer: Self-pay

## 2013-01-31 ENCOUNTER — Encounter (HOSPITAL_COMMUNITY)
Admission: RE | Admit: 2013-01-31 | Discharge: 2013-01-31 | Disposition: A | Discharge status: Home / Self Care | Payer: Medicare Other | Source: Ambulatory Visit | Attending: Rheumatology | Admitting: Rheumatology

## 2013-01-31 DIAGNOSIS — M3320 Polymyositis, organ involvement unspecified: Secondary | ICD-10-CM | POA: Insufficient documentation

## 2013-01-31 LAB — CBC
HEMATOCRIT: 34.7 % — AB (ref 36.0–46.0)
Hemoglobin: 10.9 g/dL — ABNORMAL LOW (ref 12.0–15.0)
MCH: 25.7 pg — AB (ref 26.0–34.0)
MCHC: 31.4 g/dL (ref 30.0–36.0)
MCV: 81.8 fL (ref 78.0–100.0)
Platelets: 283 10*3/uL (ref 150–400)
RBC: 4.24 MIL/uL (ref 3.87–5.11)
RDW: 17.5 % — ABNORMAL HIGH (ref 11.5–15.5)
WBC: 8.6 10*3/uL (ref 4.0–10.5)

## 2013-01-31 LAB — COMPREHENSIVE METABOLIC PANEL
ALT: 23 U/L (ref 0–35)
AST: 35 U/L (ref 0–37)
Albumin: 3 g/dL — ABNORMAL LOW (ref 3.5–5.2)
Alkaline Phosphatase: 55 U/L (ref 39–117)
BILIRUBIN TOTAL: 0.3 mg/dL (ref 0.3–1.2)
BUN: 23 mg/dL (ref 6–23)
CALCIUM: 9.3 mg/dL (ref 8.4–10.5)
CHLORIDE: 101 meq/L (ref 96–112)
CO2: 23 mEq/L (ref 19–32)
Creatinine, Ser: 0.51 mg/dL (ref 0.50–1.10)
GFR calc non Af Amer: >90 mL/min (ref >90)
GLUCOSE: 93 mg/dL (ref 70–99)
Potassium: 3.8 mEq/L (ref 3.7–5.3)
SODIUM: 142 meq/L (ref 137–147)
Total Protein: 6.4 g/dL (ref 6.0–8.3)

## 2013-01-31 LAB — SEDIMENTATION RATE: Sed Rate: 28 mm/hr — ABNORMAL HIGH (ref 0–22)

## 2013-01-31 LAB — CK: Total CK: 1813 U/L — ABNORMAL HIGH (ref 7–177)

## 2013-01-31 MED ORDER — METHYLPREDNISOLONE SODIUM SUCC 125 MG IJ SOLR
INTRAMUSCULAR | Status: AC
  Administered 2013-01-31: 09:00:00 100 mg
  Filled 2013-01-31: qty 2

## 2013-01-31 MED ORDER — METHYLPREDNISOLONE SODIUM SUCC 125 MG IJ SOLR: 100.0000 mg | Freq: Once | INTRAMUSCULAR | Status: DC

## 2013-01-31 MED ORDER — HEPARIN SOD (PORK) LOCK FLUSH 100 UNIT/ML IV SOLN
500.0000 [IU] | INTRAVENOUS | Status: DC
  Administered 2013-01-31: 14:00:00 500 [IU]

## 2013-01-31 MED ORDER — SODIUM CHLORIDE 0.9 % IV SOLN
Freq: Once | INTRAVENOUS | Status: AC
  Administered 2013-01-31: 09:00:00 via INTRAVENOUS

## 2013-01-31 MED ORDER — LORATADINE 10 MG PO TABS: 10.0000 mg | ORAL_TABLET | Freq: Every day | ORAL | Status: DC

## 2013-01-31 MED ORDER — HEPARIN SOD (PORK) LOCK FLUSH 100 UNIT/ML IV SOLN
INTRAVENOUS | Status: AC
  Filled 2013-01-31: qty 5

## 2013-01-31 MED ORDER — SODIUM CHLORIDE 0.9 % IV SOLN
1000.0000 mg | Freq: Once | INTRAVENOUS | Status: AC
  Administered 2013-01-31: 09:00:00 1000 mg via INTRAVENOUS
  Filled 2013-01-31: qty 100

## 2013-01-31 MED ORDER — HEPARIN SOD (PORK) LOCK FLUSH 100 UNIT/ML IV SOLN: 500.0000 [IU] | INTRAVENOUS | Status: DC | PRN

## 2013-02-11 ENCOUNTER — Other Ambulatory Visit (HOSPITAL_COMMUNITY): Payer: Self-pay

## 2013-02-14 ENCOUNTER — Encounter (HOSPITAL_COMMUNITY)
Admission: RE | Admit: 2013-02-14 | Discharge: 2013-02-14 | Disposition: A | Discharge status: Home / Self Care | Payer: Medicare Other | Source: Ambulatory Visit | Attending: Rheumatology | Admitting: Rheumatology

## 2013-02-14 DIAGNOSIS — M3320 Polymyositis, organ involvement unspecified: Secondary | ICD-10-CM | POA: Insufficient documentation

## 2013-02-14 LAB — BASIC METABOLIC PANEL
BUN: 35 mg/dL — ABNORMAL HIGH (ref 6–23)
CO2: 22 mEq/L (ref 19–32)
CREATININE: 0.66 mg/dL (ref 0.50–1.10)
Calcium: 9.3 mg/dL (ref 8.4–10.5)
Chloride: 101 mEq/L (ref 96–112)
GFR calc non Af Amer: 86 mL/min — ABNORMAL LOW (ref >90)
Glucose, Bld: 82 mg/dL (ref 70–99)
POTASSIUM: 4.1 meq/L (ref 3.7–5.3)
Sodium: 142 mEq/L (ref 137–147)

## 2013-02-14 MED ORDER — ZOLEDRONIC ACID 5 MG/100ML IV SOLN
INTRAVENOUS | Status: AC
  Filled 2013-02-14: qty 100

## 2013-02-14 MED ORDER — ZOLEDRONIC ACID 5 MG/100ML IV SOLN
INTRAVENOUS | Status: AC
  Administered 2013-02-14: 5 mg via INTRAVENOUS
  Filled 2013-02-14: qty 100

## 2013-02-14 MED ORDER — ZOLEDRONIC ACID 5 MG/100ML IV SOLN
5.0000 mg | Freq: Once | INTRAVENOUS | Status: AC
  Administered 2013-02-14: 5 mg via INTRAVENOUS

## 2013-02-14 MED ORDER — HEPARIN SOD (PORK) LOCK FLUSH 100 UNIT/ML IV SOLN
500.0000 [IU] | INTRAVENOUS | Status: DC
  Administered 2013-02-14: 500 [IU]

## 2013-02-14 MED ORDER — HEPARIN SOD (PORK) LOCK FLUSH 100 UNIT/ML IV SOLN
INTRAVENOUS | Status: AC
  Administered 2013-02-14: 500 [IU]
  Filled 2013-02-14: qty 5

## 2013-02-14 MED ORDER — HEPARIN SOD (PORK) LOCK FLUSH 100 UNIT/ML IV SOLN: 500.0000 [IU] | INTRAVENOUS | Status: DC | PRN

## 2013-02-14 NOTE — Progress Notes (Signed)
Porta catheter right upper chest accessed per protocol.  Good blood return with 10 cc waste then labs were drawn.  Flushed with 10 cc NSS and NSS hung at 10 cc hour

## 2013-02-14 NOTE — Progress Notes (Signed)
Flushed Portacath with 10cc of NS and 500 units of heparin, and deaccessed pt's port. Site WNL

## 2013-03-01 ENCOUNTER — Encounter (HOSPITAL_COMMUNITY): Payer: Medicare (Managed Care)

## 2013-03-03 ENCOUNTER — Encounter (HOSPITAL_COMMUNITY): Payer: Medicare (Managed Care)

## 2013-03-08 ENCOUNTER — Other Ambulatory Visit (HOSPITAL_COMMUNITY): Payer: Self-pay

## 2013-03-09 ENCOUNTER — Encounter (HOSPITAL_COMMUNITY)
Admission: RE | Admit: 2013-03-09 | Discharge: 2013-03-09 | Disposition: A | Discharge status: Home / Self Care | Payer: Medicare Other | Source: Ambulatory Visit | Attending: Rheumatology | Admitting: Rheumatology

## 2013-03-09 DIAGNOSIS — Z452 Encounter for adjustment and management of vascular access device: Secondary | ICD-10-CM | POA: Insufficient documentation

## 2013-03-09 MED ORDER — HEPARIN SOD (PORK) LOCK FLUSH 100 UNIT/ML IV SOLN
500.0000 [IU] | INTRAVENOUS | Status: DC | PRN
  Administered 2013-03-09: 500 [IU]

## 2013-03-09 MED ORDER — HEPARIN (PORCINE) LOCK FLUSH 10 UNIT/ML IV SOLN: 10.0000 [IU] | Freq: Once | INTRAVENOUS | Status: DC

## 2013-03-09 MED ORDER — HEPARIN SOD (PORK) LOCK FLUSH 100 UNIT/ML IV SOLN
INTRAVENOUS | Status: AC
Start: 2013-03-09 — End: 2013-03-09
  Administered 2013-03-09: 500 [IU]
  Filled 2013-03-09: qty 5

## 2013-03-09 NOTE — Progress Notes (Signed)
Used sterile technique, flushed PAC with 10 cc and 500units of Heparin after blood return noted.  Site flushed well and WNL

## 2013-03-15 ENCOUNTER — Other Ambulatory Visit: Payer: Self-pay | Admitting: Orthopedic Surgery

## 2013-03-15 DIAGNOSIS — R102 Pelvic and perineal pain: Secondary | ICD-10-CM

## 2013-03-23 ENCOUNTER — Ambulatory Visit
Admission: RE | Admit: 2013-03-23 | Discharge: 2013-03-23 | Disposition: A | Discharge status: Home / Self Care | Payer: Medicare (Managed Care) | Source: Ambulatory Visit | Attending: Orthopedic Surgery | Admitting: Orthopedic Surgery

## 2013-03-23 DIAGNOSIS — R102 Pelvic and perineal pain: Secondary | ICD-10-CM

## 2013-04-06 ENCOUNTER — Encounter (HOSPITAL_COMMUNITY)
Admission: RE | Admit: 2013-04-06 | Discharge: 2013-04-06 | Disposition: A | Discharge status: Home / Self Care | Payer: Medicare Other | Source: Ambulatory Visit | Attending: Rheumatology | Admitting: Rheumatology

## 2013-04-06 DIAGNOSIS — M3320 Polymyositis, organ involvement unspecified: Secondary | ICD-10-CM | POA: Insufficient documentation

## 2013-04-06 LAB — COMPREHENSIVE METABOLIC PANEL
ALBUMIN: 3.3 g/dL — AB (ref 3.5–5.2)
ALK PHOS: 49 U/L (ref 39–117)
ALT: 21 U/L (ref 0–35)
AST: 30 U/L (ref 0–37)
BUN: 22 mg/dL (ref 6–23)
CALCIUM: 9.9 mg/dL (ref 8.4–10.5)
CO2: 24 mEq/L (ref 19–32)
Chloride: 99 mEq/L (ref 96–112)
Creatinine, Ser: 0.49 mg/dL — ABNORMAL LOW (ref 0.50–1.10)
GFR calc Af Amer: >90 mL/min (ref >90)
GFR calc non Af Amer: >90 mL/min (ref >90)
Glucose, Bld: 89 mg/dL (ref 70–99)
Potassium: 4 mEq/L (ref 3.7–5.3)
SODIUM: 140 meq/L (ref 137–147)
TOTAL PROTEIN: 6.6 g/dL (ref 6.0–8.3)
Total Bilirubin: 0.3 mg/dL (ref 0.3–1.2)

## 2013-04-06 LAB — CBC
HEMATOCRIT: 34.2 % — AB (ref 36.0–46.0)
Hemoglobin: 11 g/dL — ABNORMAL LOW (ref 12.0–15.0)
MCH: 25.6 pg — ABNORMAL LOW (ref 26.0–34.0)
MCHC: 32.2 g/dL (ref 30.0–36.0)
MCV: 79.5 fL (ref 78.0–100.0)
Platelets: 306 10*3/uL (ref 150–400)
RBC: 4.3 MIL/uL (ref 3.87–5.11)
RDW: 17.5 % — AB (ref 11.5–15.5)
WBC: 8.9 10*3/uL (ref 4.0–10.5)

## 2013-04-06 LAB — CK: CK TOTAL: 1229 U/L — AB (ref 7–177)

## 2013-04-06 MED ORDER — HEPARIN SOD (PORK) LOCK FLUSH 100 UNIT/ML IV SOLN
INTRAVENOUS | Status: AC
  Filled 2013-04-06: qty 5

## 2013-04-06 MED ORDER — HEPARIN SOD (PORK) LOCK FLUSH 100 UNIT/ML IV SOLN
500.0000 [IU] | INTRAVENOUS | Status: DC | PRN
  Administered 2013-04-06: 500 [IU]

## 2013-04-30 ENCOUNTER — Encounter (HOSPITAL_COMMUNITY): Payer: Self-pay | Admitting: Emergency Medicine

## 2013-04-30 ENCOUNTER — Emergency Department (HOSPITAL_COMMUNITY)
Admission: EM | Admit: 2013-04-30 | Discharge: 2013-04-30 | Disposition: A | Discharge status: Home / Self Care | Payer: Medicare HMO | Attending: Emergency Medicine | Admitting: Emergency Medicine

## 2013-04-30 ENCOUNTER — Emergency Department (HOSPITAL_COMMUNITY): Payer: Medicare HMO

## 2013-04-30 DIAGNOSIS — Z79899 Other long term (current) drug therapy: Secondary | ICD-10-CM | POA: Insufficient documentation

## 2013-04-30 DIAGNOSIS — N390 Urinary tract infection, site not specified: Secondary | ICD-10-CM | POA: Insufficient documentation

## 2013-04-30 DIAGNOSIS — K219 Gastro-esophageal reflux disease without esophagitis: Secondary | ICD-10-CM | POA: Insufficient documentation

## 2013-04-30 DIAGNOSIS — IMO0002 Reserved for concepts with insufficient information to code with codable children: Secondary | ICD-10-CM | POA: Insufficient documentation

## 2013-04-30 DIAGNOSIS — Z8739 Personal history of other diseases of the musculoskeletal system and connective tissue: Secondary | ICD-10-CM | POA: Insufficient documentation

## 2013-04-30 DIAGNOSIS — I10 Essential (primary) hypertension: Secondary | ICD-10-CM | POA: Insufficient documentation

## 2013-04-30 DIAGNOSIS — Z862 Personal history of diseases of the blood and blood-forming organs and certain disorders involving the immune mechanism: Secondary | ICD-10-CM | POA: Insufficient documentation

## 2013-04-30 DIAGNOSIS — Z8639 Personal history of other endocrine, nutritional and metabolic disease: Secondary | ICD-10-CM | POA: Insufficient documentation

## 2013-04-30 DIAGNOSIS — Z8709 Personal history of other diseases of the respiratory system: Secondary | ICD-10-CM | POA: Insufficient documentation

## 2013-04-30 DIAGNOSIS — R109 Unspecified abdominal pain: Secondary | ICD-10-CM | POA: Insufficient documentation

## 2013-04-30 LAB — CBC WITH DIFFERENTIAL/PLATELET
BASOS ABS: 0 10*3/uL (ref 0.0–0.1)
BASOS PCT: 0 % (ref 0–1)
EOS ABS: 0 10*3/uL (ref 0.0–0.7)
EOS PCT: 0 % (ref 0–5)
HEMATOCRIT: 33.7 % — AB (ref 36.0–46.0)
HEMOGLOBIN: 10.8 g/dL — AB (ref 12.0–15.0)
Lymphocytes Relative: 11 % — ABNORMAL LOW (ref 12–46)
Lymphs Abs: 1 10*3/uL (ref 0.7–4.0)
MCH: 25.6 pg — AB (ref 26.0–34.0)
MCHC: 32 g/dL (ref 30.0–36.0)
MCV: 79.9 fL (ref 78.0–100.0)
MONO ABS: 0.5 10*3/uL (ref 0.1–1.0)
MONOS PCT: 6 % (ref 3–12)
Neutro Abs: 7.6 10*3/uL (ref 1.7–7.7)
Neutrophils Relative %: 83 % — ABNORMAL HIGH (ref 43–77)
Platelets: 293 10*3/uL (ref 150–400)
RBC: 4.22 MIL/uL (ref 3.87–5.11)
RDW: 18 % — AB (ref 11.5–15.5)
WBC: 9.1 10*3/uL (ref 4.0–10.5)

## 2013-04-30 LAB — URINALYSIS, ROUTINE W REFLEX MICROSCOPIC
Bilirubin Urine: NEGATIVE
GLUCOSE, UA: NEGATIVE mg/dL
Hgb urine dipstick: NEGATIVE
KETONES UR: NEGATIVE mg/dL
NITRITE: NEGATIVE
PH: 5 (ref 5.0–8.0)
PROTEIN: NEGATIVE mg/dL
Specific Gravity, Urine: 1.02 (ref 1.005–1.030)
Urobilinogen, UA: 0.2 mg/dL (ref 0.0–1.0)

## 2013-04-30 LAB — COMPREHENSIVE METABOLIC PANEL
ALBUMIN: 3.4 g/dL — AB (ref 3.5–5.2)
ALT: 19 U/L (ref 0–35)
AST: 27 U/L (ref 0–37)
Alkaline Phosphatase: 46 U/L (ref 39–117)
BILIRUBIN TOTAL: 0.4 mg/dL (ref 0.3–1.2)
BUN: 23 mg/dL (ref 6–23)
CALCIUM: 9.1 mg/dL (ref 8.4–10.5)
CO2: 21 mEq/L (ref 19–32)
Chloride: 102 mEq/L (ref 96–112)
Creatinine, Ser: 0.54 mg/dL (ref 0.50–1.10)
GFR calc Af Amer: >90 mL/min (ref >90)
Glucose, Bld: 94 mg/dL (ref 70–99)
Potassium: 4.2 mEq/L (ref 3.7–5.3)
Sodium: 139 mEq/L (ref 137–147)
Total Protein: 6.8 g/dL (ref 6.0–8.3)

## 2013-04-30 LAB — URINE MICROSCOPIC-ADD ON

## 2013-04-30 LAB — LIPASE, BLOOD: LIPASE: 38 U/L (ref 11–59)

## 2013-04-30 LAB — POC OCCULT BLOOD, ED: FECAL OCCULT BLD: NEGATIVE

## 2013-04-30 MED ORDER — IOHEXOL 300 MG/ML  SOLN
80.0000 mL | Freq: Once | INTRAMUSCULAR | Status: AC | PRN
  Administered 2013-04-30: 80 mL via INTRAVENOUS

## 2013-04-30 MED ORDER — ONDANSETRON HCL 4 MG/2ML IJ SOLN
4.0000 mg | Freq: Once | INTRAMUSCULAR | Status: AC
  Administered 2013-04-30: 4 mg via INTRAVENOUS
  Filled 2013-04-30: qty 2

## 2013-04-30 MED ORDER — IOHEXOL 300 MG/ML  SOLN
25.0000 mL | Freq: Once | INTRAMUSCULAR | Status: AC | PRN
  Administered 2013-04-30: 25 mL via ORAL

## 2013-04-30 MED ORDER — SODIUM CHLORIDE 0.9 % IV BOLUS (SEPSIS)
1000.0000 mL | Freq: Once | INTRAVENOUS | Status: AC
  Administered 2013-04-30: 1000 mL via INTRAVENOUS

## 2013-04-30 MED ORDER — CEPHALEXIN 250 MG PO CAPS: 250.0000 mg | ORAL_CAPSULE | Freq: Four times a day (QID) | ORAL | Status: DC

## 2013-04-30 MED ORDER — HYDROMORPHONE HCL PF 1 MG/ML IJ SOLN
0.5000 mg | Freq: Once | INTRAMUSCULAR | Status: AC
  Administered 2013-04-30: 0.5 mg via INTRAVENOUS
  Filled 2013-04-30: qty 1

## 2013-04-30 MED ORDER — ONDANSETRON 8 MG PO TBDP: 8.0000 mg | ORAL_TABLET | Freq: Three times a day (TID) | ORAL | Status: DC | PRN

## 2013-04-30 MED ORDER — CEPHALEXIN 250 MG/5ML PO SUSR
250.0000 mg | Freq: Once | ORAL | Status: AC
  Administered 2013-04-30: 250 mg via ORAL
  Filled 2013-04-30: qty 5

## 2013-04-30 NOTE — Discharge Instructions (Signed)
Abdominal Pain, Women °Abdominal (stomach, pelvic, or belly) pain can be caused by many things. It is important to tell your doctor: °· The location of the pain. °· Does it come and go or is it present all the time? °· Are there things that start the pain (eating certain foods, exercise)? °· Are there other symptoms associated with the pain (fever, nausea, vomiting, diarrhea)? °All of this is helpful to know when trying to find the cause of the pain. °CAUSES  °· Stomach: virus or bacteria infection, or ulcer. °· Intestine: appendicitis (inflamed appendix), regional ileitis (Crohn's disease), ulcerative colitis (inflamed colon), irritable bowel syndrome, diverticulitis (inflamed diverticulum of the colon), or cancer of the stomach or intestine. °· Gallbladder disease or stones in the gallbladder. °· Kidney disease, kidney stones, or infection. °· Pancreas infection or cancer. °· Fibromyalgia (pain disorder). °· Diseases of the female organs: °· Uterus: fibroid (non-cancerous) tumors or infection. °· Fallopian tubes: infection or tubal pregnancy. °· Ovary: cysts or tumors. °· Pelvic adhesions (scar tissue). °· Endometriosis (uterus lining tissue growing in the pelvis and on the pelvic organs). °· Pelvic congestion syndrome (female organs filling up with blood just before the menstrual period). °· Pain with the menstrual period. °· Pain with ovulation (producing an egg). °· Pain with an IUD (intrauterine device, birth control) in the uterus. °· Cancer of the female organs. °· Functional pain (pain not caused by a disease, may improve without treatment). °· Psychological pain. °· Depression. °DIAGNOSIS  °Your doctor will decide the seriousness of your pain by doing an examination. °· Blood tests. °· X-rays. °· Ultrasound. °· CT scan (computed tomography, special type of X-Malikiah Debarr). °· MRI (magnetic resonance imaging). °· Cultures, for infection. °· Barium enema (dye inserted in the large intestine, to better view it with  X-rays). °· Colonoscopy (looking in intestine with a lighted tube). °· Laparoscopy (minor surgery, looking in abdomen with a lighted tube). °· Major abdominal exploratory surgery (looking in abdomen with a large incision). °TREATMENT  °The treatment will depend on the cause of the pain.  °· Many cases can be observed and treated at home. °· Over-the-counter medicines recommended by your caregiver. °· Prescription medicine. °· Antibiotics, for infection. °· Birth control pills, for painful periods or for ovulation pain. °· Hormone treatment, for endometriosis. °· Nerve blocking injections. °· Physical therapy. °· Antidepressants. °· Counseling with a psychologist or psychiatrist. °· Minor or major surgery. °HOME CARE INSTRUCTIONS  °· Do not take laxatives, unless directed by your caregiver. °· Take over-the-counter pain medicine only if ordered by your caregiver. Do not take aspirin because it can cause an upset stomach or bleeding. °· Try a clear liquid diet (broth or water) as ordered by your caregiver. Slowly move to a bland diet, as tolerated, if the pain is related to the stomach or intestine. °· Have a thermometer and take your temperature several times a day, and record it. °· Bed rest and sleep, if it helps the pain. °· Avoid sexual intercourse, if it causes pain. °· Avoid stressful situations. °· Keep your follow-up appointments and tests, as your caregiver orders. °· If the pain does not go away with medicine or surgery, you may try: °· Acupuncture. °· Relaxation exercises (yoga, meditation). °· Group therapy. °· Counseling. °SEEK MEDICAL CARE IF:  °· You notice certain foods cause stomach pain. °· Your home care treatment is not helping your pain. °· You need stronger pain medicine. °· You want your IUD removed. °· You feel faint or   lightheaded.  You develop nausea and vomiting.  You develop a rash.  You are having side effects or an allergy to your medicine. SEEK IMMEDIATE MEDICAL CARE IF:   Your  pain does not go away or gets worse.  You have a fever.  Your pain is felt only in portions of the abdomen. The right side could possibly be appendicitis. The left lower portion of the abdomen could be colitis or diverticulitis.  You are passing blood in your stools (bright red or black tarry stools, with or without vomiting).  You have blood in your urine.  You develop chills, with or without a fever.  You pass out. MAKE SURE YOU:   Understand these instructions.  Will watch your condition.  Will get help right away if you are not doing well or get worse. Document Released: 10/27/2006 Document Revised: 03/24/2011 Document Reviewed: 11/16/2008 [facility] Patient Information 2014 [place], [place]. Urinary Tract Infection Urinary tract infections (UTIs) can develop anywhere along your urinary tract. Your urinary tract is your body's drainage system for removing wastes and extra water. Your urinary tract includes two kidneys, two ureters, a bladder, and a urethra. Your kidneys are a pair of bean-shaped organs. Each kidney is about the size of your fist. They are located below your ribs, one on each side of your spine. CAUSES Infections are caused by microbes, which are microscopic organisms, including fungi, viruses, and bacteria. These organisms are so small that they can only be seen through a microscope. Bacteria are the microbes that most commonly cause UTIs. SYMPTOMS  Symptoms of UTIs may vary by age and gender of the patient and by the location of the infection. Symptoms in young women typically include a frequent and intense urge to urinate and a painful, burning feeling in the bladder or urethra during urination. Older women and men are more likely to be tired, shaky, and weak and have muscle aches and abdominal pain. A fever may mean the infection is in your kidneys. Other symptoms of a kidney infection include pain in your back or sides below the ribs, nausea, and  vomiting. DIAGNOSIS To diagnose a UTI, your caregiver will ask you about your symptoms. Your caregiver also will ask to provide a urine sample. The urine sample will be tested for bacteria and white blood cells. White blood cells are made by your body to help fight infection. TREATMENT  Typically, UTIs can be treated with medication. Because most UTIs are caused by a bacterial infection, they usually can be treated with the use of antibiotics. The choice of antibiotic and length of treatment depend on your symptoms and the type of bacteria causing your infection. HOME CARE INSTRUCTIONS  If you were prescribed antibiotics, take them exactly as your caregiver instructs you. Finish the medication even if you feel better after you have only taken some of the medication.  Drink enough water and fluids to keep your urine clear or pale yellow.  Avoid caffeine, tea, and carbonated beverages. They tend to irritate your bladder.  Empty your bladder often. Avoid holding urine for long periods of time.  Empty your bladder before and after sexual intercourse.  After a bowel movement, women should cleanse from front to back. Use each tissue only once. SEEK MEDICAL CARE IF:   You have back pain.  You develop a fever.  Your symptoms do not begin to resolve within 3 days. SEEK IMMEDIATE MEDICAL CARE IF:   You have severe back pain or lower abdominal pain.  You develop chills.  You have nausea or vomiting.  You have continued burning or discomfort with urination. MAKE SURE YOU:   Understand these instructions.  Will watch your condition.  Will get help right away if you are not doing well or get worse. Document Released: 10/09/2004 Document Revised: 07/01/2011 Document Reviewed: 02/07/2011 [facility] Patient Information 2014 [place].

## 2013-04-30 NOTE — ED Provider Notes (Signed)
CSN: [id number]     Arrival date & time 04/30/13  1514 History   First MD Initiated Contact with Patient 04/30/13 1612     Chief Complaint  Patient presents with  . 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 She states she had an episode last week that resolved spontaneously. It recurred today at approximately 11:30. She describes it as sharp in the left side of her abdomen or into her belly button and stating that radiates up and down and to the suprapubic area. She states that after it began she became nauseated and had one episode of vomiting that involved [place] the breakfast and fluids. She did not note any blood or coffee-ground emesis. This was the only episode of vomiting that she had. The pain is 5/10. She did not attempt to take anything for this. She has not had any fever or chills. She denies any urinary tract infection symptoms. She states her bowel movements have been normal. She denies any rectal bleeding or dark tarry stool. Past Medical History  Diagnosis Date  . Benign neoplasm of colon   . Esophageal reflux   . Polymyositis   . Allergic rhinitis, cause unspecified   . Unspecified essential hypertension   . Hyperlipidemia   . Osteoporosis   . Vitamin D deficiency   . GI hemorrhage     recurrent  . 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 Lumbar laminectomy    . Tonsillectomy    . Bilateral  rotator cuff surgery    . Esophagogastroduodenoscopy  12/08/2010    others also  . Abdominal hysterectomy      partial  . Colonoscopy  01/09/2011    others also  . Eus    . Colonoscopy  05/29/2011    Procedure: COLONOSCOPY;  Surgeon: Gatha Mayer, MD;  Location: [facility];  Service: Endoscopy;  Laterality: N/A;  Greggory Brandy Carlean Purl  .  Hot hemostasis  05/29/2011    Procedure: HOT HEMOSTASIS (ARGON PLASMA COAGULATION/BICAP);  Surgeon: Gatha Mayer, MD;  Location: [facility];  Service: Endoscopy;  Laterality: N/A;   Family History  Problem Relation Age of Onset  . Dementia Mother   . Hypertension Father   . Malignant hyperthermia Father    History  Substance Use Topics  . Smoking status: Never Smoker   . Smokeless tobacco: Current User    Types: Snuff  . Alcohol Use: No   OB History   Grav Para Term Preterm Abortions TAB SAB Ect Mult Living                 Review of Systems  All other systems reviewed and are negative.     Allergies  Codeine and Hydrocodone  Home Medications   Prior to Admission medications   Medication Sig Start Date End Date Taking? Authorizing Provider  ALPRAZolam (XANAX) 0.25 MG tablet Take 0.25 mg by mouth 3 (three) times daily as needed. Anxiety and sleep   Yes Historical Provider, MD  Ascorbic Acid (VITAMIN C) 1000 MG tablet Take 1,000 mg by mouth daily.    Yes Historical Provider, MD  atenolol (TENORMIN) 50 MG tablet Take 50 mg by mouth daily.     Yes Historical Provider, MD  Calcium Carbonate (CALTRATE 600) 1500 MG TABS Take 2 tablets by mouth daily. Daily  Yes Historical Provider, MD  ergocalciferol (VITAMIN D2) 50000 UNITS capsule Take 50,000 Units by mouth 2 (two) times a week. Takes 1 tablet every Monday and friday   Yes Historical Provider, MD  esomeprazole (NEXIUM) 40 MG capsule Take 40 mg by mouth daily before breakfast.    Yes Historical Provider, MD  fluticasone (FLONASE) 50 MCG/ACT nasal spray Place 1 spray into both nostrils daily. 08/06/11  Yes Historical Provider, MD  folic acid (FOLVITE) 858 MCG tablet Take 400 mcg by mouth daily.     Yes Historical Provider, MD  furosemide (LASIX) 40 MG tablet Take 40 mg by mouth daily.     Yes Historical Provider, MD  loratadine (CLARITIN) 10 MG tablet Take 10 mg by mouth daily as needed. For allergies.   Yes Historical Provider,  MD  mycophenolate (CELLCEPT) 250 MG capsule Take 1,000 mg by mouth 2 (two) times daily.    Yes Historical Provider, MD  omega-3 acid ethyl esters (LOVAZA) 1 G capsule Take 4 g by mouth daily.    Yes Historical Provider, MD  potassium chloride SA (K-DUR,KLOR-CON) 20 MEQ tablet Take 40 mEq by mouth daily.    Yes Historical Provider, MD  predniSONE (DELTASONE) 10 MG tablet Take 10 mg by mouth daily.   Yes Historical Provider, MD  psyllium (HYDROCIL/METAMUCIL) 95 % PACK Take 1 packet by mouth daily. 05/15/11  Yes [name], MD  sodium chloride (OCEAN) 0.65 % nasal spray Place 1 spray into the nose as needed. For allergies.   Yes Historical Provider, MD  vitamin A 8000 UNIT capsule Take 8,000 Units by mouth daily.   Yes Historical Provider, MD  vitamin B-12 (CYANOCOBALAMIN) 1000 MCG tablet Take 1,000 mcg by mouth daily.     Yes Historical Provider, MD  vitamin E 600 UNIT capsule Take 600 Units by mouth daily.     Yes Historical Provider, MD   BP 119/54  Pulse 83  Temp(Src) 98.3 F (36.8 C) (Oral)  Resp 19  SpO2 100% Physical Exam  Nursing note and vitals reviewed. Constitutional: She is oriented to person, place, and time. She appears well-developed and well-nourished.  HENT:  Head: Normocephalic and atraumatic.  Right Ear: External ear normal.  Left Ear: External ear normal.  Nose: Nose normal.  Mouth/Throat: Oropharynx is clear and moist.  Eyes: Conjunctivae and EOM are normal. Pupils are equal, round, and reactive to light.  Neck: Normal range of motion.  Cardiovascular: Normal rate, regular rhythm, normal heart sounds and intact distal pulses.   Pulmonary/Chest: Effort normal and breath sounds normal.  Abdominal: Soft. Bowel sounds are normal. She exhibits no distension and no mass. There is tenderness. There is no rebound and no guarding.    Musculoskeletal: Normal range of motion.  Neurological: She is alert and oriented to person, place, and time. She has normal reflexes.  Skin:  Skin is warm and dry.  Psychiatric: She has a normal mood and affect. Her behavior is normal. Judgment and thought content normal.    ED Course  Procedures (including critical care time) Labs Review Labs Reviewed  CBC WITH DIFFERENTIAL - Abnormal; Notable for the following:    Hemoglobin 10.8 (*)    HCT 33.7 (*)    MCH 25.6 (*)    RDW 18.0 (*)    Neutrophils Relative % 83 (*)    Lymphocytes Relative 11 (*)    All other components within normal limits  COMPREHENSIVE METABOLIC PANEL - Abnormal; Notable for the following:    Albumin 3.4 (*)  All other components within normal limits  URINALYSIS, ROUTINE W REFLEX MICROSCOPIC - Abnormal; Notable for the following:    APPearance CLOUDY (*)    Leukocytes, UA MODERATE (*)    All other components within normal limits  URINE MICROSCOPIC-ADD ON - Abnormal; Notable for the following:    Squamous Epithelial / LPF MANY (*)    Bacteria, UA FEW (*)    All other components within normal limits  LIPASE, BLOOD  POC OCCULT BLOOD, ED    Imaging Review No results found.   EKG Interpretation None      MDM   Final diagnoses:  None   Patient feels improved. Reexamination of her abdomen reveals that it is now soft and nontender. I have discussed the lab results with the patient and her family. I have discussed return precautions and need for followup. And they voice understanding. Currently there is no evidence of diverticulitis, pyelonephritis, or other acute intra-abdominal process. She may have a mild urinary tract infection and will be treated with Keflex. She also will be given Zofran for any nausea.   Shaune Pollack, MD 04/30/13 [id number]

## 2013-04-30 NOTE — ED Notes (Signed)
Pt has port-a-cath 

## 2013-04-30 NOTE — ED Notes (Signed)
Pt taken to CT.

## 2013-04-30 NOTE — ED Notes (Signed)
Pt reports intermittent n/v/abd pain since Monday. She states the pain "feels like gas."

## 2013-05-05 ENCOUNTER — Encounter (HOSPITAL_COMMUNITY): Payer: Medicare (Managed Care)

## 2013-05-11 ENCOUNTER — Encounter: Payer: Self-pay | Admitting: Gastroenterology

## 2013-05-11 ENCOUNTER — Ambulatory Visit (INDEPENDENT_AMBULATORY_CARE_PROVIDER_SITE_OTHER): Payer: Medicare HMO | Admitting: Gastroenterology

## 2013-05-11 VITALS — BP 110/70 | HR 76

## 2013-05-11 DIAGNOSIS — R110 Nausea: Secondary | ICD-10-CM

## 2013-05-11 DIAGNOSIS — R109 Unspecified abdominal pain: Secondary | ICD-10-CM

## 2013-05-11 MED ORDER — ESOMEPRAZOLE MAGNESIUM 40 MG PO CPDR: DELAYED_RELEASE_CAPSULE | ORAL | Status: DC

## 2013-05-11 NOTE — Patient Instructions (Signed)
We have sent the following medications to your pharmacy for you to pick up at your convenience: Nexium 40 mg, please take one tablet by mouth thirty minutes before breakfast and thirty minutes before dinner  We have given you samples of the following medication to take: Florastor take one capsule by mouth once daily  Your physician has requested that you go to the basement for the following lab work before leaving today: H-Pylori Stool Test   Information on GERD is below for your review ____________________________________________________________________________________________________________________________________________________________________________________________________  Diet for Gastroesophageal Reflux Disease, Adult Reflux (acid reflux) is when acid from your stomach flows up into the esophagus. When acid comes in contact with the esophagus, the acid causes irritation and soreness (inflammation) in the esophagus. When reflux happens often or so severely that it causes damage to the esophagus, it is called gastroesophageal reflux disease (GERD). Nutrition therapy can help ease the discomfort of GERD. FOODS OR DRINKS TO AVOID OR LIMIT  Smoking or chewing tobacco. Nicotine is one of the most potent stimulants to acid production in the gastrointestinal tract.  Caffeinated and decaffeinated coffee and black tea.  Regular or low-calorie carbonated beverages or energy drinks (caffeine-free carbonated beverages are allowed).   Strong spices, such as black pepper, white pepper, red pepper, cayenne, curry powder, and chili powder.  Peppermint or spearmint.  Chocolate.  High-fat foods, including meats and fried foods. Extra added fats including oils, butter, salad dressings, and nuts. Limit these to less than 8 tsp per day.  Fruits and vegetables if they are not tolerated, such as citrus fruits or tomatoes.  Alcohol.  Any food that seems to aggravate your condition. If you have  questions regarding your diet, call your caregiver or a registered dietitian. OTHER THINGS THAT MAY HELP GERD INCLUDE:   Eating your meals slowly, in a relaxed setting.  Eating 5 to 6 small meals per day instead of 3 large meals.  Eliminating food for a period of time if it causes distress.  Not lying down until 3 hours after eating a meal.  Keeping the head of your bed raised 6 to 9 inches (15 to 23 cm) by using a foam wedge or blocks under the legs of the bed. Lying flat may make symptoms worse.  Being physically active. Weight loss may be helpful in reducing reflux in overweight or obese adults.  Wear loose fitting clothing EXAMPLE MEAL PLAN This meal plan is approximately 2,000 calories based on [email or web address] meal planning guidelines. Breakfast   cup cooked oatmeal.  1 cup strawberries.  1 cup low-fat milk.  1 oz almonds. Snack  1 cup cucumber slices.  6 oz yogurt (made from low-fat or fat-free milk). Lunch  2 slice whole-wheat bread.  2 oz sliced [place].  2 tsp mayonnaise.  1 cup blueberries.  1 cup snap peas. Snack  6 whole-wheat crackers.  1 oz string cheese. Dinner   cup brown rice.  1 cup mixed veggies.  1 tsp olive oil.  3 oz grilled fish. Document Released: 12/30/2004 Document Revised: 03/24/2011 Document Reviewed: 11/15/2010 [facility] Patient Information 2014 [place], [place].

## 2013-05-13 ENCOUNTER — Other Ambulatory Visit: Payer: Medicare (Managed Care)

## 2013-05-13 DIAGNOSIS — R109 Unspecified abdominal pain: Secondary | ICD-10-CM

## 2013-05-14 LAB — HELICOBACTER PYLORI  SPECIAL ANTIGEN: H. PYLORI Antigen: NEGATIVE

## 2013-05-17 ENCOUNTER — Encounter: Payer: Self-pay | Admitting: Gastroenterology

## 2013-05-17 DIAGNOSIS — R109 Unspecified abdominal pain: Secondary | ICD-10-CM | POA: Insufficient documentation

## 2013-05-17 DIAGNOSIS — R110 Nausea: Secondary | ICD-10-CM | POA: Insufficient documentation

## 2013-05-17 NOTE — Progress Notes (Signed)
05/17/2013 WAVE CALZADA [id number] Dec 11, 1940   History of Present Illness:  Patient is a 73 year old female who is known to Dr. Ardis Hughs.  She has a history of dyspepsia.  She is a poor historian and it is difficult to get specific details regarding her complaints.  Her daughter and a family friend are present with her at her visit, but do not offer much additional information.  She comes in today with complaints of LUQ abdominal pain and lower abdominal discomfort with some associated nausea.  She had one episode of non-bloody emesis that she feels was reflux related.  She says that it began about a month ago and she thought it was a virus or something at first.  She says that the symptoms were worse on Saturday, 4/18, so she went to the ED.  CT scan of the abdomen and pelvis was performed and revealed no acute findings to account for her symptoms; had only sigmoid diverticulosis and a small benign right upper pole renal cyst.  Lipase and CMP were normal and she was FOBT negative.  She has a mild chronic anemia with Hgb stable at 10.8 grams.  She was treated with Keflex for a UTI, but has now completed that medication and the symptoms are still present.  She has been taking Nexium 40 mg daily for quite some time for dyspepsia and reflux symptoms, but increased that two twice a day just two days ago.  Has zofran to take for nausea.  Denies any bowel issues.    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  She also had a diminutive polyp in the proximal transverse colon (was a tubular adenoma) and moderate diverticulosis.  She is in for a colonoscopy recall in 01/2014 due to previous history of multiple polyps in 2012.  She had an EGD 11/2010 that was normal.   Current Medications, Allergies, Past Medical History, Past Surgical History, Family History and Social History were reviewed  in [employer] record.   Physical Exam: BP 110/70  Pulse 76 General: Elderly black female in no acute distress Head: Normocephalic and atraumatic Eyes:  Sclerae anicteric, conjunctiva pink  Ears: Normal auditory acuity Lungs: Clear throughout to auscultation Heart: Regular rate and rhythm Abdomen: Soft, non-distended.  Normal bowel sounds.  Minimal non-specific TTP without R/R/G. Musculoskeletal: Symmetrical with no gross deformities  Extremities: No edema  Neurological: Alert oriented x 4, grossly non-focal Psychological:  Alert and cooperative. Normal mood and affect  Assessment and Recommendations: -Abdominal pain, mostly left sided with some associated nausea.  Has history of dyspeptic complaints in the past as well.  Continue on increased dose of Nexium 40 mg BID for now.  Will check stool for Hpylori antigen.  Will attempt to follow GERD dietary measures/lifestyle modifications.  Will have her return for follow-up in 6 weeks or soon if symptoms worsen or fail to improved.

## 2013-05-17 NOTE — Progress Notes (Signed)
i agree with the plan above 

## 2013-05-30 ENCOUNTER — Other Ambulatory Visit (HOSPITAL_COMMUNITY): Payer: Self-pay | Admitting: *Deleted

## 2013-05-31 ENCOUNTER — Encounter (HOSPITAL_COMMUNITY)
Admission: RE | Admit: 2013-05-31 | Discharge: 2013-05-31 | Disposition: A | Discharge status: Home / Self Care | Payer: Medicare HMO | Source: Ambulatory Visit | Attending: Rheumatology | Admitting: Rheumatology

## 2013-05-31 DIAGNOSIS — M3320 Polymyositis, organ involvement unspecified: Secondary | ICD-10-CM | POA: Diagnosis not present

## 2013-05-31 MED ORDER — HEPARIN SOD (PORK) LOCK FLUSH 100 UNIT/ML IV SOLN
INTRAVENOUS | Status: AC
  Administered 2013-05-31: 500 [IU]
  Filled 2013-05-31: qty 5

## 2013-05-31 MED ORDER — HEPARIN SOD (PORK) LOCK FLUSH 100 UNIT/ML IV SOLN
500.0000 [IU] | INTRAVENOUS | Status: AC | PRN
  Administered 2013-05-31: 500 [IU]

## 2013-06-07 ENCOUNTER — Telehealth: Payer: Self-pay | Admitting: Gastroenterology

## 2013-06-07 NOTE — Telephone Encounter (Signed)
She should add carafate suspension: take 69mL twice daily, dispense 1 monht with 3 refills.

## 2013-06-07 NOTE — Telephone Encounter (Signed)
The pt has abd burning and abd discomfort.  She is on Nexium BID and florastor daily. She is watching her diet and following reflux precautions, she has follow up 07/12/13 with Dr Ardis Hughs.  What else can she try while waiting to see Dr Ardis Hughs?

## 2013-06-08 MED ORDER — SUCRALFATE 1 GM/10ML PO SUSP: 1.0000 g | Freq: Two times a day (BID) | ORAL | Status: DC

## 2013-06-08 NOTE — Telephone Encounter (Signed)
Pt is aware and will call if carafate does not help

## 2013-06-27 ENCOUNTER — Other Ambulatory Visit (HOSPITAL_COMMUNITY): Payer: Self-pay | Admitting: *Deleted

## 2013-06-28 ENCOUNTER — Encounter (HOSPITAL_COMMUNITY): Payer: Medicare (Managed Care)

## 2013-06-28 ENCOUNTER — Encounter (HOSPITAL_COMMUNITY)
Admission: RE | Admit: 2013-06-28 | Discharge: 2013-06-28 | Disposition: A | Discharge status: Home / Self Care | Payer: Medicare HMO | Source: Ambulatory Visit | Attending: Rheumatology | Admitting: Rheumatology

## 2013-06-28 DIAGNOSIS — M3320 Polymyositis, organ involvement unspecified: Secondary | ICD-10-CM | POA: Diagnosis present

## 2013-06-28 MED ORDER — HEPARIN SOD (PORK) LOCK FLUSH 100 UNIT/ML IV SOLN
INTRAVENOUS | Status: AC
  Administered 2013-06-28: 500 [IU]
  Filled 2013-06-28: qty 5

## 2013-06-28 MED ORDER — HEPARIN SOD (PORK) LOCK FLUSH 100 UNIT/ML IV SOLN
500.0000 [IU] | INTRAVENOUS | Status: DC
  Administered 2013-06-28: 500 [IU]

## 2013-07-12 ENCOUNTER — Ambulatory Visit: Payer: Medicare HMO | Admitting: Gastroenterology

## 2013-07-25 ENCOUNTER — Other Ambulatory Visit (HOSPITAL_COMMUNITY): Payer: Self-pay | Admitting: *Deleted

## 2013-07-26 ENCOUNTER — Inpatient Hospital Stay (HOSPITAL_COMMUNITY): Admission: RE | Admit: 2013-07-26 | Payer: Medicare (Managed Care) | Source: Ambulatory Visit

## 2013-08-01 ENCOUNTER — Encounter (HOSPITAL_COMMUNITY)
Admission: RE | Admit: 2013-08-01 | Discharge: 2013-08-01 | Disposition: A | Discharge status: Home / Self Care | Payer: Medicare HMO | Source: Ambulatory Visit | Attending: Rheumatology | Admitting: Rheumatology

## 2013-08-01 DIAGNOSIS — M3320 Polymyositis, organ involvement unspecified: Secondary | ICD-10-CM | POA: Diagnosis present

## 2013-08-01 LAB — CBC
HCT: 33.1 % — ABNORMAL LOW (ref 36.0–46.0)
Hemoglobin: 10.4 g/dL — ABNORMAL LOW (ref 12.0–15.0)
MCH: 26.1 pg (ref 26.0–34.0)
MCHC: 31.4 g/dL (ref 30.0–36.0)
MCV: 83.2 fL (ref 78.0–100.0)
PLATELETS: 281 10*3/uL (ref 150–400)
RBC: 3.98 MIL/uL (ref 3.87–5.11)
RDW: 17.9 % — ABNORMAL HIGH (ref 11.5–15.5)
WBC: 9.1 10*3/uL (ref 4.0–10.5)

## 2013-08-01 LAB — COMPREHENSIVE METABOLIC PANEL
ALBUMIN: 3.4 g/dL — AB (ref 3.5–5.2)
ALT: 23 U/L (ref 0–35)
AST: 44 U/L — ABNORMAL HIGH (ref 0–37)
Alkaline Phosphatase: 63 U/L (ref 39–117)
Anion gap: 17 — ABNORMAL HIGH (ref 5–15)
BUN: 24 mg/dL — ABNORMAL HIGH (ref 6–23)
CALCIUM: 9.5 mg/dL (ref 8.4–10.5)
CO2: 22 mEq/L (ref 19–32)
CREATININE: 0.44 mg/dL — AB (ref 0.50–1.10)
Chloride: 104 mEq/L (ref 96–112)
GFR calc Af Amer: >90 mL/min (ref >90)
GFR calc non Af Amer: >90 mL/min (ref >90)
Glucose, Bld: 102 mg/dL — ABNORMAL HIGH (ref 70–99)
Potassium: 4 mEq/L (ref 3.7–5.3)
Sodium: 143 mEq/L (ref 137–147)
Total Bilirubin: 0.2 mg/dL — ABNORMAL LOW (ref 0.3–1.2)
Total Protein: 6.8 g/dL (ref 6.0–8.3)

## 2013-08-01 LAB — CK: CK TOTAL: 877 U/L — AB (ref 7–177)

## 2013-08-01 MED ORDER — HEPARIN SOD (PORK) LOCK FLUSH 100 UNIT/ML IV SOLN
500.0000 [IU] | INTRAVENOUS | Status: DC
  Administered 2013-08-01: 500 [IU]

## 2013-08-01 MED ORDER — HEPARIN SOD (PORK) LOCK FLUSH 100 UNIT/ML IV SOLN
INTRAVENOUS | Status: AC
  Administered 2013-08-01: 500 [IU]
  Filled 2013-08-01: qty 5

## 2013-08-29 ENCOUNTER — Encounter (HOSPITAL_COMMUNITY)
Admission: RE | Admit: 2013-08-29 | Discharge: 2013-08-29 | Disposition: A | Discharge status: Home / Self Care | Payer: Medicare HMO | Source: Ambulatory Visit | Attending: Rheumatology | Admitting: Rheumatology

## 2013-08-29 DIAGNOSIS — M3320 Polymyositis, organ involvement unspecified: Secondary | ICD-10-CM | POA: Insufficient documentation

## 2013-08-29 MED ORDER — HEPARIN SOD (PORK) LOCK FLUSH 100 UNIT/ML IV SOLN: 500.0000 [IU] | INTRAVENOUS | Status: DC

## 2013-08-29 MED ORDER — HEPARIN SOD (PORK) LOCK FLUSH 100 UNIT/ML IV SOLN
INTRAVENOUS | Status: AC
  Administered 2013-08-29: 500 [IU]
  Filled 2013-08-29: qty 5

## 2013-09-20 ENCOUNTER — Ambulatory Visit: Payer: Medicare HMO | Admitting: Gastroenterology

## 2013-09-26 ENCOUNTER — Encounter (HOSPITAL_COMMUNITY)
Admission: RE | Admit: 2013-09-26 | Discharge: 2013-09-26 | Disposition: A | Discharge status: Home / Self Care | Payer: Medicare HMO | Source: Ambulatory Visit | Attending: Rheumatology | Admitting: Rheumatology

## 2013-09-26 DIAGNOSIS — M3320 Polymyositis, organ involvement unspecified: Secondary | ICD-10-CM | POA: Diagnosis not present

## 2013-09-26 MED ORDER — HEPARIN SOD (PORK) LOCK FLUSH 100 UNIT/ML IV SOLN
INTRAVENOUS | Status: AC
  Administered 2013-09-26: 500 [IU]
  Filled 2013-09-26: qty 5

## 2013-09-26 MED ORDER — HEPARIN SOD (PORK) LOCK FLUSH 100 UNIT/ML IV SOLN
500.0000 [IU] | INTRAVENOUS | Status: AC | PRN
  Administered 2013-09-26: 500 [IU]

## 2013-10-21 ENCOUNTER — Other Ambulatory Visit (HOSPITAL_COMMUNITY): Payer: Self-pay | Admitting: *Deleted

## 2013-10-24 ENCOUNTER — Encounter (HOSPITAL_COMMUNITY)
Admission: RE | Admit: 2013-10-24 | Discharge: 2013-10-24 | Disposition: A | Discharge status: Home / Self Care | Payer: Medicare Other | Source: Ambulatory Visit | Attending: Rheumatology | Admitting: Rheumatology

## 2013-10-24 DIAGNOSIS — M810 Age-related osteoporosis without current pathological fracture: Secondary | ICD-10-CM | POA: Diagnosis present

## 2013-10-24 MED ORDER — HEPARIN SOD (PORK) LOCK FLUSH 100 UNIT/ML IV SOLN
INTRAVENOUS | Status: AC
  Administered 2013-10-24: 500 [IU]
  Filled 2013-10-24: qty 5

## 2013-10-24 MED ORDER — HEPARIN SOD (PORK) LOCK FLUSH 100 UNIT/ML IV SOLN
500.0000 [IU] | INTRAVENOUS | Status: DC | PRN
  Administered 2013-10-24: 500 [IU]

## 2013-10-28 ENCOUNTER — Other Ambulatory Visit: Payer: Self-pay

## 2013-10-28 DIAGNOSIS — Z1231 Encounter for screening mammogram for malignant neoplasm of breast: Secondary | ICD-10-CM

## 2013-11-14 ENCOUNTER — Telehealth: Payer: Self-pay | Admitting: Gastroenterology

## 2013-11-14 ENCOUNTER — Ambulatory Visit: Payer: Medicare HMO | Admitting: Gastroenterology

## 2013-11-14 NOTE — Telephone Encounter (Signed)
Do not bill 

## 2013-11-18 ENCOUNTER — Ambulatory Visit: Payer: Medicare (Managed Care)

## 2013-11-24 ENCOUNTER — Other Ambulatory Visit (HOSPITAL_COMMUNITY): Payer: Self-pay | Admitting: *Deleted

## 2013-11-24 ENCOUNTER — Encounter (HOSPITAL_COMMUNITY): Payer: Medicare (Managed Care)

## 2013-11-25 ENCOUNTER — Encounter (HOSPITAL_COMMUNITY)
Admission: RE | Admit: 2013-11-25 | Discharge: 2013-11-25 | Disposition: A | Discharge status: Home / Self Care | Payer: Medicare Other | Source: Ambulatory Visit | Attending: Rheumatology | Admitting: Rheumatology

## 2013-11-25 DIAGNOSIS — M810 Age-related osteoporosis without current pathological fracture: Secondary | ICD-10-CM | POA: Diagnosis present

## 2013-11-25 LAB — CBC
HCT: 32.2 % — ABNORMAL LOW (ref 36.0–46.0)
Hemoglobin: 10.4 g/dL — ABNORMAL LOW (ref 12.0–15.0)
MCH: 26.1 pg (ref 26.0–34.0)
MCHC: 32.3 g/dL (ref 30.0–36.0)
MCV: 80.9 fL (ref 78.0–100.0)
PLATELETS: 298 10*3/uL (ref 150–400)
RBC: 3.98 MIL/uL (ref 3.87–5.11)
RDW: 17.3 % — AB (ref 11.5–15.5)
WBC: 7.7 10*3/uL (ref 4.0–10.5)

## 2013-11-25 LAB — COMPREHENSIVE METABOLIC PANEL
ALBUMIN: 3.2 g/dL — AB (ref 3.5–5.2)
ALT: 22 U/L (ref 0–35)
AST: 26 U/L (ref 0–37)
Alkaline Phosphatase: 50 U/L (ref 39–117)
Anion gap: 17 — ABNORMAL HIGH (ref 5–15)
BUN: 21 mg/dL (ref 6–23)
CHLORIDE: 105 meq/L (ref 96–112)
CO2: 22 mEq/L (ref 19–32)
CREATININE: 0.61 mg/dL (ref 0.50–1.10)
Calcium: 9.2 mg/dL (ref 8.4–10.5)
GFR calc Af Amer: >90 mL/min (ref >90)
GFR calc non Af Amer: 87 mL/min — ABNORMAL LOW (ref >90)
Glucose, Bld: 102 mg/dL — ABNORMAL HIGH (ref 70–99)
Potassium: 3.3 mEq/L — ABNORMAL LOW (ref 3.7–5.3)
Sodium: 144 mEq/L (ref 137–147)
TOTAL PROTEIN: 6.5 g/dL (ref 6.0–8.3)
Total Bilirubin: 0.3 mg/dL (ref 0.3–1.2)

## 2013-11-25 LAB — CK: CK TOTAL: 958 U/L — AB (ref 7–177)

## 2013-11-25 MED ORDER — HEPARIN SOD (PORK) LOCK FLUSH 100 UNIT/ML IV SOLN
500.0000 [IU] | INTRAVENOUS | Status: AC | PRN
  Administered 2013-11-25: 500 [IU]

## 2013-11-25 MED ORDER — HEPARIN SOD (PORK) LOCK FLUSH 100 UNIT/ML IV SOLN
INTRAVENOUS | Status: AC
  Filled 2013-11-25: qty 5

## 2013-12-01 ENCOUNTER — Other Ambulatory Visit: Payer: Self-pay | Admitting: Gastroenterology

## 2013-12-23 ENCOUNTER — Encounter (HOSPITAL_COMMUNITY): Payer: Medicare (Managed Care)

## 2013-12-26 ENCOUNTER — Encounter (HOSPITAL_COMMUNITY)
Admission: RE | Admit: 2013-12-26 | Discharge: 2013-12-26 | Disposition: A | Discharge status: Home / Self Care | Payer: Medicare HMO | Source: Ambulatory Visit | Attending: Rheumatology | Admitting: Rheumatology

## 2013-12-26 ENCOUNTER — Other Ambulatory Visit (HOSPITAL_COMMUNITY): Payer: Self-pay | Admitting: *Deleted

## 2013-12-26 DIAGNOSIS — M810 Age-related osteoporosis without current pathological fracture: Secondary | ICD-10-CM | POA: Diagnosis present

## 2013-12-26 LAB — CK: Total CK: 1106 U/L — ABNORMAL HIGH (ref 7–177)

## 2013-12-26 LAB — CBC
HCT: 32.4 % — ABNORMAL LOW (ref 36.0–46.0)
Hemoglobin: 10.1 g/dL — ABNORMAL LOW (ref 12.0–15.0)
MCH: 25.1 pg — AB (ref 26.0–34.0)
MCHC: 31.2 g/dL (ref 30.0–36.0)
MCV: 80.6 fL (ref 78.0–100.0)
PLATELETS: 282 10*3/uL (ref 150–400)
RBC: 4.02 MIL/uL (ref 3.87–5.11)
RDW: 17.2 % — AB (ref 11.5–15.5)
WBC: 8.2 10*3/uL (ref 4.0–10.5)

## 2013-12-26 LAB — COMPREHENSIVE METABOLIC PANEL WITH GFR
ALT: 20 U/L (ref 0–35)
AST: 29 U/L (ref 0–37)
Albumin: 3.3 g/dL — ABNORMAL LOW (ref 3.5–5.2)
Alkaline Phosphatase: 57 U/L (ref 39–117)
Anion gap: 16 — ABNORMAL HIGH (ref 5–15)
BUN: 24 mg/dL — ABNORMAL HIGH (ref 6–23)
CO2: 20 meq/L (ref 19–32)
Calcium: 9.6 mg/dL (ref 8.4–10.5)
Chloride: 109 meq/L (ref 96–112)
Creatinine, Ser: 0.57 mg/dL (ref 0.50–1.10)
GFR calc Af Amer: >90 mL/min
GFR calc non Af Amer: 89 mL/min — ABNORMAL LOW
Glucose, Bld: 113 mg/dL — ABNORMAL HIGH (ref 70–99)
Potassium: 3.8 meq/L (ref 3.7–5.3)
Sodium: 145 meq/L (ref 137–147)
Total Bilirubin: 0.2 mg/dL — ABNORMAL LOW (ref 0.3–1.2)
Total Protein: 6.6 g/dL (ref 6.0–8.3)

## 2013-12-26 MED ORDER — HEPARIN SOD (PORK) LOCK FLUSH 100 UNIT/ML IV SOLN
INTRAVENOUS | Status: AC
  Administered 2013-12-26: 500 [IU]
  Filled 2013-12-26: qty 5

## 2013-12-26 MED ORDER — HEPARIN SOD (PORK) LOCK FLUSH 100 UNIT/ML IV SOLN
500.0000 [IU] | INTRAVENOUS | Status: AC | PRN
  Administered 2013-12-26: 500 [IU]

## 2014-01-03 ENCOUNTER — Ambulatory Visit: Payer: Medicare (Managed Care)

## 2014-01-16 ENCOUNTER — Telehealth: Payer: Self-pay | Admitting: Gastroenterology

## 2014-01-16 NOTE — Telephone Encounter (Signed)
Pt has nausea and abd discomfort, appt has been rescheduled to tomorrow at 1115 with Dr Ardis Hughs

## 2014-01-17 ENCOUNTER — Encounter: Payer: Self-pay | Admitting: Gastroenterology

## 2014-01-17 ENCOUNTER — Ambulatory Visit (INDEPENDENT_AMBULATORY_CARE_PROVIDER_SITE_OTHER): Payer: Medicare Other | Admitting: Gastroenterology

## 2014-01-17 VITALS — BP 110/60 | HR 72

## 2014-01-17 DIAGNOSIS — R112 Nausea with vomiting, unspecified: Secondary | ICD-10-CM

## 2014-01-17 DIAGNOSIS — R1030 Lower abdominal pain, unspecified: Secondary | ICD-10-CM

## 2014-01-17 DIAGNOSIS — Z86010 Personal history of colonic polyps: Secondary | ICD-10-CM

## 2014-01-17 MED ORDER — MOVIPREP 100 G PO SOLR: 1.0000 | Freq: Once | ORAL | Status: DC

## 2014-01-17 NOTE — Progress Notes (Signed)
Review of pertinent gastrointestinal problems: 1. History of colonic adenomas. Colonoscopy January 2007. I removed three colon polyps, one was tubulovillous, 10 mm. Her next colonoscopy has been scheduled for January 2010. Repeat colonoscopy March, 2010 found more adenomas, recall colonoscopy was set at 3 year interval.  2. 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 This is seen by EGD January 2007, small, round. Attempt at a [place] was done two weeks later, but she did not tolerate the procedure very well, due to gagging and discomfort. Repeat EGD January, 2010 found lesion have not changed in size in 3 year interval, still less than 1 cm. Recommend clinical observation only 3. Dyspepsia, September 2012 evaluation: Plain films normal, CBC normal. I felt this was related to her eating habits and recommended 4-5 smaller meals a day rather than 2 large meals a day. 4. 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 stark while hospitalized. He also found several small adenomatous polyps, removed them. She needs recall colonoscopy 3 years from now. She had also undergone EGD 11/12 one month prior for anemia, rectal bleeding. This was normal per Dr. Olevia Perches.  Colonoscopy 05/2011 Dr. 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  HPI: This is a pleasant 74 year old woman whom I last saw myself about 2 years ago. She was here in the office 75 months ago when she saw Janett Billow.  I last saw her 75 years ago.  She has pains in lower abdomen.  HAs been going on a long time.  Pain   Feels week, poor appetite. Has intermittent vomiting.  She tells me this has been going on for about a month.  Has tried carafate with minimal, fleeting improvement.    Was given align probiotic.  She is in a wheelchair today.  Fell 2-3 years ago.  Has mixed solid, liquid stools about daily.  Overall she thinks she has  lost weight lately.  Takes prednisone 20mg  daily for many years.  No NSAIDs that she is aware of.    Past Medical History  Diagnosis Date  . Benign neoplasm of colon   . Esophageal reflux   . Polymyositis   . Allergic rhinitis, cause unspecified   . Unspecified essential hypertension   . Hyperlipidemia   . Osteoporosis   . Vitamin D deficiency   . GI hemorrhage     recurrent  . 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 Lumbar laminectomy    . Tonsillectomy    . Bilateral  rotator cuff surgery    . Esophagogastroduodenoscopy  12/08/2010    others also  . Abdominal hysterectomy      partial  . Colonoscopy  01/09/2011    others also  . Eus    . Colonoscopy  05/29/2011    Procedure: COLONOSCOPY;  Surgeon: Gatha Mayer, MD;  Location: [facility];  Service: Endoscopy;  Laterality: N/A;  Greggory Brandy Carlean Purl  . Hot hemostasis  05/29/2011    Procedure: HOT HEMOSTASIS (ARGON PLASMA COAGULATION/BICAP);  Surgeon: Gatha Mayer, MD;  Location: [facility];  Service: Endoscopy;  Laterality: N/A;    Current Outpatient Prescriptions  Medication Sig Dispense Refill  . ALPRAZolam (XANAX) 0.25 MG tablet Take 0.25 mg by mouth 3 (three) times daily as needed. Anxiety and sleep    . Ascorbic Acid (VITAMIN C) 1000 MG tablet Take 1,000 mg by  mouth daily.     Marland Kitchen atenolol (TENORMIN) 50 MG tablet Take 50 mg by mouth daily.      . Calcium Carbonate (CALTRATE 600) 1500 MG TABS Take 2 tablets by mouth daily. Daily    . cephALEXin (KEFLEX) 250 MG capsule Take 1 capsule (250 mg total) by mouth 4 (four) times daily. 28 capsule 0  . ergocalciferol (VITAMIN D2) 50000 UNITS capsule Take 50,000 Units by mouth 2 (two) times a week. Takes 1 tablet every Monday and friday    . esomeprazole (NEXIUM) 40 MG capsule Take one capsule by thirty minutes before breakfast Take one capsule by thirty minutes before dinner 60 capsule 3  . fluticasone (FLONASE) 50  MCG/ACT nasal spray Place 1 spray into both nostrils daily.    . folic acid (FOLVITE) 938 MCG tablet Take 400 mcg by mouth daily.      . furosemide (LASIX) 40 MG tablet Take 40 mg by mouth daily.      Marland Kitchen loratadine (CLARITIN) 10 MG tablet Take 10 mg by mouth daily as needed. For allergies.    Marland Kitchen omega-3 acid ethyl esters (LOVAZA) 1 G capsule Take 4 g by mouth daily.     . ondansetron (ZOFRAN ODT) 8 MG disintegrating tablet Take 1 tablet (8 mg total) by mouth every 8 (eight) hours as needed for nausea or vomiting. 20 tablet 0  . potassium chloride SA (K-DUR,KLOR-CON) 20 MEQ tablet Take 40 mEq by mouth daily.     . predniSONE (DELTASONE) 10 MG tablet Take 10 mg by mouth daily.    . psyllium (HYDROCIL/METAMUCIL) 95 % PACK Take 1 packet by mouth daily. 56 each 6  . sodium chloride (OCEAN) 0.65 % nasal spray Place 1 spray into the nose as needed. For allergies.    Marland Kitchen sucralfate (CARAFATE) 1 GM/10ML suspension Take 10 mLs (1 g total) by mouth 2 (two) times daily. 420 mL 3  . vitamin A 8000 UNIT capsule Take 8,000 Units by mouth daily.    . vitamin B-12 (CYANOCOBALAMIN) 1000 MCG tablet Take 1,000 mcg by mouth daily.      . vitamin E 600 UNIT capsule Take 600 Units by mouth daily.       No current facility-administered medications for this visit.    Allergies as of 01/17/2014 - Review Complete 01/17/2014  Allergen Reaction Noted  . Codeine Other (See Comments)   . Hydrocodone  01/10/2013    Family History  Problem Relation Age of Onset  . Dementia Mother   . Hypertension Father   . Malignant hyperthermia Father   . Colon cancer Neg Hx     History   Social History  . Marital Status: Legally Separated    Spouse Name: N/A    Number of Children: 5  . Years of Education: N/A   Occupational History  . retired     Social History Main Topics  . Smoking status: Never Smoker   . Smokeless tobacco: Current User    Types: Snuff     Comment: Tobacco info given 05/11/13  . Alcohol Use: No  .  Drug Use: No  . Sexual Activity: No   Other Topics Concern  . Not on file   Social History Narrative      Physical Exam: BP 110/60 mmHg  Pulse 72  Ht   Wt  Constitutional: Chronically ill-appearing, sits in a wheelchair, obese Psychiatric: alert and oriented x3 Abdomen: soft, nontender, nondistended, no obvious ascites, no peritoneal signs, normal bowel sounds  Assessment and plan: 74 y.o. female with multitude of upper and lower GI symptoms  She told me her symptoms are only new in the past 1 month but also that they have been going on when she was last year in our office 9 months ago. I do think her symptoms which are multiple, have been chronic in nature and likely functional in origin. She has had multiple adenomatous polyps and I was planning to repeat colonoscopy this year and we will arrange for that to be done at her soonest convenience. Given her intermittent nausea, intermittent vomiting I will plan to proceed with EGD at the same time. She asked for some sort of a pill to help make her better but as I am uncertain on the underlying diagnosis I do not know what I would give her.

## 2014-01-17 NOTE — Patient Instructions (Signed)
You will be set up for a colonoscopy for history of polyps (MAC sedation, [facility] since you are not ambulatory). You will be set up for an upper endoscopy for nausea, vomiting.

## 2014-01-25 ENCOUNTER — Ambulatory Visit: Payer: Medicare (Managed Care) | Admitting: Gastroenterology

## 2014-01-26 ENCOUNTER — Encounter (HOSPITAL_COMMUNITY)
Admission: RE | Admit: 2014-01-26 | Discharge: 2014-01-26 | Disposition: A | Discharge status: Home / Self Care | Payer: Medicare Other | Source: Ambulatory Visit | Attending: Rheumatology | Admitting: Rheumatology

## 2014-01-26 DIAGNOSIS — M810 Age-related osteoporosis without current pathological fracture: Secondary | ICD-10-CM | POA: Insufficient documentation

## 2014-01-26 LAB — CBC
HCT: 33.9 % — ABNORMAL LOW (ref 36.0–46.0)
Hemoglobin: 10.4 g/dL — ABNORMAL LOW (ref 12.0–15.0)
MCH: 24.6 pg — ABNORMAL LOW (ref 26.0–34.0)
MCHC: 30.7 g/dL (ref 30.0–36.0)
MCV: 80.3 fL (ref 78.0–100.0)
PLATELETS: 291 10*3/uL (ref 150–400)
RBC: 4.22 MIL/uL (ref 3.87–5.11)
RDW: 17.5 % — ABNORMAL HIGH (ref 11.5–15.5)
WBC: 8.3 10*3/uL (ref 4.0–10.5)

## 2014-01-26 LAB — COMPREHENSIVE METABOLIC PANEL
ALK PHOS: 45 U/L (ref 39–117)
ALT: 26 U/L (ref 0–35)
ANION GAP: 15 (ref 5–15)
AST: 36 U/L (ref 0–37)
Albumin: 3.4 g/dL — ABNORMAL LOW (ref 3.5–5.2)
BUN: 19 mg/dL (ref 6–23)
CO2: 25 mmol/L (ref 19–32)
Calcium: 9.3 mg/dL (ref 8.4–10.5)
Chloride: 104 mEq/L (ref 96–112)
Creatinine, Ser: 0.67 mg/dL (ref 0.50–1.10)
GFR calc Af Amer: >90 mL/min (ref >90)
GFR calc non Af Amer: 84 mL/min — ABNORMAL LOW (ref >90)
GLUCOSE: 113 mg/dL — AB (ref 70–99)
POTASSIUM: 3.7 mmol/L (ref 3.5–5.1)
Sodium: 144 mmol/L (ref 135–145)
TOTAL PROTEIN: 6.2 g/dL (ref 6.0–8.3)
Total Bilirubin: 0.8 mg/dL (ref 0.3–1.2)

## 2014-01-26 LAB — CK: CK TOTAL: 746 U/L — AB (ref 7–177)

## 2014-01-26 MED ORDER — HEPARIN SOD (PORK) LOCK FLUSH 100 UNIT/ML IV SOLN
INTRAVENOUS | Status: AC
  Administered 2014-01-26: 500 [IU]
  Filled 2014-01-26: qty 5

## 2014-01-26 MED ORDER — HEPARIN SOD (PORK) LOCK FLUSH 100 UNIT/ML IV SOLN
500.0000 [IU] | INTRAVENOUS | Status: DC | PRN
  Administered 2014-01-26: 500 [IU]

## 2014-02-06 ENCOUNTER — Encounter (HOSPITAL_COMMUNITY): Payer: Self-pay | Admitting: *Deleted

## 2014-02-07 ENCOUNTER — Ambulatory Visit: Payer: Medicare (Managed Care)

## 2014-02-16 ENCOUNTER — Ambulatory Visit (HOSPITAL_COMMUNITY): Payer: Medicare Other | Admitting: Anesthesiology

## 2014-02-16 ENCOUNTER — Encounter (HOSPITAL_COMMUNITY)
Admission: RE | Disposition: A | Discharge status: Home / Self Care | Payer: Self-pay | Source: Ambulatory Visit | Attending: Gastroenterology

## 2014-02-16 ENCOUNTER — Ambulatory Visit (HOSPITAL_COMMUNITY)
Admission: RE | Admit: 2014-02-16 | Discharge: 2014-02-16 | Disposition: A | Discharge status: Home / Self Care | Payer: Medicare Other | Source: Ambulatory Visit | Attending: Gastroenterology | Admitting: Gastroenterology

## 2014-02-16 ENCOUNTER — Encounter (HOSPITAL_COMMUNITY): Payer: Self-pay

## 2014-02-16 DIAGNOSIS — E785 Hyperlipidemia, unspecified: Secondary | ICD-10-CM | POA: Diagnosis not present

## 2014-02-16 DIAGNOSIS — I10 Essential (primary) hypertension: Secondary | ICD-10-CM | POA: Diagnosis not present

## 2014-02-16 DIAGNOSIS — M810 Age-related osteoporosis without current pathological fracture: Secondary | ICD-10-CM | POA: Insufficient documentation

## 2014-02-16 DIAGNOSIS — Z1211 Encounter for screening for malignant neoplasm of colon: Secondary | ICD-10-CM

## 2014-02-16 DIAGNOSIS — Z8711 Personal history of peptic ulcer disease: Secondary | ICD-10-CM | POA: Insufficient documentation

## 2014-02-16 DIAGNOSIS — R112 Nausea with vomiting, unspecified: Secondary | ICD-10-CM

## 2014-02-16 DIAGNOSIS — F17200 Nicotine dependence, unspecified, uncomplicated: Secondary | ICD-10-CM | POA: Diagnosis not present

## 2014-02-16 DIAGNOSIS — Z86010 Personal history of colonic polyps: Secondary | ICD-10-CM | POA: Insufficient documentation

## 2014-02-16 DIAGNOSIS — R1030 Lower abdominal pain, unspecified: Secondary | ICD-10-CM

## 2014-02-16 DIAGNOSIS — K219 Gastro-esophageal reflux disease without esophagitis: Secondary | ICD-10-CM | POA: Diagnosis not present

## 2014-02-16 DIAGNOSIS — K2970 Gastritis, unspecified, without bleeding: Secondary | ICD-10-CM | POA: Diagnosis not present

## 2014-02-16 DIAGNOSIS — K3189 Other diseases of stomach and duodenum: Secondary | ICD-10-CM | POA: Diagnosis not present

## 2014-02-16 DIAGNOSIS — K5730 Diverticulosis of large intestine without perforation or abscess without bleeding: Secondary | ICD-10-CM | POA: Diagnosis not present

## 2014-02-16 DIAGNOSIS — R109 Unspecified abdominal pain: Secondary | ICD-10-CM | POA: Diagnosis present

## 2014-02-16 HISTORY — PX: COLONOSCOPY WITH PROPOFOL: SHX5780

## 2014-02-16 HISTORY — PX: ESOPHAGOGASTRODUODENOSCOPY (EGD) WITH PROPOFOL: SHX5813

## 2014-02-16 SURGERY — ESOPHAGOGASTRODUODENOSCOPY (EGD) WITH PROPOFOL
Anesthesia: Monitor Anesthesia Care

## 2014-02-16 MED ORDER — PROPOFOL INFUSION 10 MG/ML OPTIME
INTRAVENOUS | Status: DC | PRN
Start: 2014-02-16 — End: 2014-02-16
  Administered 2014-02-16: 140 ug/kg/min via INTRAVENOUS

## 2014-02-16 MED ORDER — HEPARIN SOD (PORK) LOCK FLUSH 100 UNIT/ML IV SOLN: 500.0000 [IU] | INTRAVENOUS | Status: DC | PRN

## 2014-02-16 MED ORDER — PROPOFOL 10 MG/ML IV BOLUS
INTRAVENOUS | Status: AC
Start: 2014-02-16 — End: 2014-02-16
  Filled 2014-02-16: qty 20

## 2014-02-16 MED ORDER — PROPOFOL 10 MG/ML IV BOLUS
INTRAVENOUS | Status: DC | PRN
  Administered 2014-02-16 (×4): 20 mg via INTRAVENOUS

## 2014-02-16 MED ORDER — LACTATED RINGERS IV SOLN
INTRAVENOUS | Status: DC
  Administered 2014-02-16: 1000 mL via INTRAVENOUS

## 2014-02-16 MED ORDER — PROPOFOL 10 MG/ML IV BOLUS
INTRAVENOUS | Status: AC
  Filled 2014-02-16: qty 20

## 2014-02-16 MED ORDER — LIDOCAINE HCL (CARDIAC) 20 MG/ML IV SOLN
INTRAVENOUS | Status: AC
  Filled 2014-02-16: qty 5

## 2014-02-16 MED ORDER — SODIUM CHLORIDE 0.9 % IV SOLN: INTRAVENOUS | Status: DC

## 2014-02-16 SURGICAL SUPPLY — 25 items

## 2014-02-16 NOTE — Anesthesia Preprocedure Evaluation (Addendum)
Anesthesia Evaluation  Patient identified by MRN, date of birth, ID band Patient awake    Reviewed: Allergy & Precautions, NPO status , Patient's Chart, lab work & pertinent test results  Airway Mallampati: III  TM Distance: >3 FB Neck ROM: Full    Dental   Pulmonary neg pulmonary ROS,  breath sounds clear to auscultation        Cardiovascular hypertension, Pt. on medications Rhythm:Regular Rate:Normal     Neuro/Psych  Neuromuscular disease    GI/Hepatic Neg liver ROS, PUD, GERD-  ,  Endo/Other  Morbid obesity  Renal/GU negative Renal ROS     Musculoskeletal   Abdominal   Peds  Hematology  (+) anemia ,   Anesthesia Other Findings   Reproductive/Obstetrics                            Anesthesia Physical Anesthesia Plan  ASA: III  Anesthesia Plan: MAC   Post-op Pain Management:    Induction: Intravenous  Airway Management Planned: Nasal Cannula and Natural Airway  Additional Equipment:   Intra-op Plan:   Post-operative Plan:   Informed Consent: I have reviewed the patients History and Physical, chart, labs and discussed the procedure including the risks, benefits and alternatives for the proposed anesthesia with the patient or authorized representative who has indicated his/her understanding and acceptance.     Plan Discussed with: CRNA  Anesthesia Plan Comments:         Anesthesia Quick Evaluation

## 2014-02-16 NOTE — H&P (View-Only) (Signed)
Review of pertinent gastrointestinal problems: 1. History of colonic adenomas. Colonoscopy January 2007. I removed three colon polyps, one was tubulovillous, 10 mm. Her next colonoscopy has been scheduled for January 2010. Repeat colonoscopy March, 2010 found more adenomas, recall colonoscopy was set at 3 year interval.  2. 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 This is seen by EGD January 2007, small, round. Attempt at a [place] was done two weeks later, but she did not tolerate the procedure very well, due to gagging and discomfort. Repeat EGD January, 2010 found lesion have not changed in size in 3 year interval, still less than 1 cm. Recommend clinical observation only 3. Dyspepsia, September 2012 evaluation: Plain films normal, CBC normal. I felt this was related to her eating habits and recommended 4-5 smaller meals a day rather than 2 large meals a day. 4. 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 stark while hospitalized. He also found several small adenomatous polyps, removed them. She needs recall colonoscopy 3 years from now. She had also undergone EGD 11/12 one month prior for anemia, rectal bleeding. This was normal per Dr. Olevia Perches.  Colonoscopy 05/2011 Dr. 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  HPI: This is a pleasant 74 year old woman whom I last saw myself about 2 years ago. She was here in the office 8-9 months ago when she saw Janett Billow.  I last saw her 2 years ago.  She has pains in lower abdomen.  HAs been going on a long time.  Pain   Feels week, poor appetite. Has intermittent vomiting.  She tells me this has been going on for about a month.  Has tried carafate with minimal, fleeting improvement.    Was given align probiotic.  She is in a wheelchair today.  Fell 2-3 years ago.  Has mixed solid, liquid stools about daily.  Overall she thinks she has  lost weight lately.  Takes prednisone 20mg  daily for many years.  No NSAIDs that she is aware of.    Past Medical History  Diagnosis Date  . Benign neoplasm of colon   . Esophageal reflux   . Polymyositis   . Allergic rhinitis, cause unspecified   . Unspecified essential hypertension   . Hyperlipidemia   . Osteoporosis   . Vitamin D deficiency   . GI hemorrhage     recurrent  . 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 Lumbar laminectomy    . Tonsillectomy    . Bilateral  rotator cuff surgery    . Esophagogastroduodenoscopy  12/08/2010    others also  . Abdominal hysterectomy      partial  . Colonoscopy  01/09/2011    others also  . Eus    . Colonoscopy  05/29/2011    Procedure: COLONOSCOPY;  Surgeon: Gatha Mayer, MD;  Location: [facility];  Service: Endoscopy;  Laterality: N/A;  Greggory Brandy Carlean Purl  . Hot hemostasis  05/29/2011    Procedure: HOT HEMOSTASIS (ARGON PLASMA COAGULATION/BICAP);  Surgeon: Gatha Mayer, MD;  Location: [facility];  Service: Endoscopy;  Laterality: N/A;    Current Outpatient Prescriptions  Medication Sig Dispense Refill  . ALPRAZolam (XANAX) 0.25 MG tablet Take 0.25 mg by mouth 3 (three) times daily as needed. Anxiety and sleep    . Ascorbic Acid (VITAMIN C) 1000 MG tablet Take 1,000 mg by  mouth daily.     Marland Kitchen atenolol (TENORMIN) 50 MG tablet Take 50 mg by mouth daily.      . Calcium Carbonate (CALTRATE 600) 1500 MG TABS Take 2 tablets by mouth daily. Daily    . cephALEXin (KEFLEX) 250 MG capsule Take 1 capsule (250 mg total) by mouth 4 (four) times daily. 28 capsule 0  . ergocalciferol (VITAMIN D2) 50000 UNITS capsule Take 50,000 Units by mouth 2 (two) times a week. Takes 1 tablet every Monday and friday    . esomeprazole (NEXIUM) 40 MG capsule Take one capsule by thirty minutes before breakfast Take one capsule by thirty minutes before dinner 60 capsule 3  . fluticasone (FLONASE) 50  MCG/ACT nasal spray Place 1 spray into both nostrils daily.    . folic acid (FOLVITE) 852 MCG tablet Take 400 mcg by mouth daily.      . furosemide (LASIX) 40 MG tablet Take 40 mg by mouth daily.      Marland Kitchen loratadine (CLARITIN) 10 MG tablet Take 10 mg by mouth daily as needed. For allergies.    Marland Kitchen omega-3 acid ethyl esters (LOVAZA) 1 G capsule Take 4 g by mouth daily.     . ondansetron (ZOFRAN ODT) 8 MG disintegrating tablet Take 1 tablet (8 mg total) by mouth every 8 (eight) hours as needed for nausea or vomiting. 20 tablet 0  . potassium chloride SA (K-DUR,KLOR-CON) 20 MEQ tablet Take 40 mEq by mouth daily.     . predniSONE (DELTASONE) 10 MG tablet Take 10 mg by mouth daily.    . psyllium (HYDROCIL/METAMUCIL) 95 % PACK Take 1 packet by mouth daily. 56 each 6  . sodium chloride (OCEAN) 0.65 % nasal spray Place 1 spray into the nose as needed. For allergies.    Marland Kitchen sucralfate (CARAFATE) 1 GM/10ML suspension Take 10 mLs (1 g total) by mouth 2 (two) times daily. 420 mL 3  . vitamin A 8000 UNIT capsule Take 8,000 Units by mouth daily.    . vitamin B-12 (CYANOCOBALAMIN) 1000 MCG tablet Take 1,000 mcg by mouth daily.      . vitamin E 600 UNIT capsule Take 600 Units by mouth daily.       No current facility-administered medications for this visit.    Allergies as of 01/17/2014 - Review Complete 01/17/2014  Allergen Reaction Noted  . Codeine Other (See Comments)   . Hydrocodone  01/10/2013    Family History  Problem Relation Age of Onset  . Dementia Mother   . Hypertension Father   . Malignant hyperthermia Father   . Colon cancer Neg Hx     History   Social History  . Marital Status: Legally Separated    Spouse Name: N/A    Number of Children: 5  . Years of Education: N/A   Occupational History  . retired     Social History Main Topics  . Smoking status: Never Smoker   . Smokeless tobacco: Current User    Types: Snuff     Comment: Tobacco info given 05/11/13  . Alcohol Use: No  .  Drug Use: No  . Sexual Activity: No   Other Topics Concern  . Not on file   Social History Narrative      Physical Exam: BP 110/60 mmHg  Pulse 72  Ht   Wt  Constitutional: Chronically ill-appearing, sits in a wheelchair, obese Psychiatric: alert and oriented x3 Abdomen: soft, nontender, nondistended, no obvious ascites, no peritoneal signs, normal bowel sounds  Assessment and plan: 74 y.o. female with multitude of upper and lower GI symptoms  She told me her symptoms are only new in the past 1 month but also that they have been going on when she was last year in our office 9 months ago. I do think her symptoms which are multiple, have been chronic in nature and likely functional in origin. She has had multiple adenomatous polyps and I was planning to repeat colonoscopy this year and we will arrange for that to be done at her soonest convenience. Given her intermittent nausea, intermittent vomiting I will plan to proceed with EGD at the same time. She asked for some sort of a pill to help make her better but as I am uncertain on the underlying diagnosis I do not know what I would give her.

## 2014-02-16 NOTE — Op Note (Signed)
[facility] [place], [address]   ENDOSCOPY PROCEDURE REPORT  PATIENT: [name], [name]  MR#: [id number] BIRTHDATE: [birth year] , 73  yrs. old GENDER: female ENDOSCOPIST: Milus Banister, MD PROCEDURE DATE:  02/16/2014 PROCEDURE:  EGD w/ biopsy ASA CLASS:     Class II INDICATIONS:  abdominal pain, dyspepsia. MEDICATIONS: Monitored anesthesia care TOPICAL ANESTHETIC: none  DESCRIPTION OF PROCEDURE: After the risks benefits and alternatives of the procedure were thoroughly explained, informed consent was obtained.  The Pentax Gastroscope [id number] endoscope was introduced through the mouth and advanced to the second portion of the duodenum , Without limitations.  The instrument was slowly withdrawn as the mucosa was fully examined.    The previously noted gastric submucosal lesion has not changed (1-1.5cm lesion in body of the stomach).  There was mild non-specific distal gastritis.  The stomach was biopsied and sent to pathology.  The examination was otherwise normal.  Retroflexed views revealed no abnormalities.     The scope was then withdrawn from the patient and the procedure completed.  COMPLICATIONS: There were no immediate complications.  ENDOSCOPIC IMPRESSION: The previously noted gastric submucosal lesion has not changed (1-1.5cm lesion in body of the stomach).  There was mild non-specific distal gastritis.  The stomach was biopsied and sent to pathology.  The examination was otherwise normal  RECOMMENDATIONS: Continue current medicines.  IF biopsies show H.  pylori, you will be started on appropriate antibiotics.   eSigned:  Milus Banister, MD 02/16/2014 12:05 PM

## 2014-02-16 NOTE — Transfer of Care (Signed)
Immediate Anesthesia Transfer of Care Note  Patient: [name] [name]  Procedure(s) Performed: Procedure(s) (LRB): ESOPHAGOGASTRODUODENOSCOPY (EGD) WITH PROPOFOL (N/A) COLONOSCOPY WITH PROPOFOL (N/A)  Patient Location: PACU  Anesthesia Type: MAC  Level of Consciousness: sedated, patient cooperative and responds to stimulation  Airway & Oxygen Therapy: Patient Spontanous Breathing and Patient connected to face mask oxgen  Post-op Assessment: Report given to PACU RN and Post -op Vital signs reviewed and stable  Post vital signs: Reviewed and stable  Complications: No apparent anesthesia complications

## 2014-02-16 NOTE — Anesthesia Postprocedure Evaluation (Signed)
  Anesthesia Post-op Note  Patient: [name] [name]  Procedure(s) Performed: Procedure(s): ESOPHAGOGASTRODUODENOSCOPY (EGD) WITH PROPOFOL (N/A) COLONOSCOPY WITH PROPOFOL (N/A)  Patient Location: PACU  Anesthesia Type:MAC  Level of Consciousness: awake and alert   Airway and Oxygen Therapy: Patient Spontanous Breathing  Post-op Pain: none  Post-op Assessment: Post-op Vital signs reviewed  Post-op Vital Signs: Reviewed  Last Vitals:  Filed Vitals:   02/16/14 1159  BP: 130/67  Pulse: 84  Temp: 36.5 C  Resp: 24    Complications: No apparent anesthesia complications

## 2014-02-16 NOTE — Op Note (Signed)
[facility] [place], [address]   COLONOSCOPY PROCEDURE REPORT  PATIENT: [name] [name], [name] [name]  MR#: [id number] BIRTHDATE: December 15, 1940 , 73  yrs. old GENDER: female ENDOSCOPIST: Milus Banister, MD PROCEDURE DATE:  02/16/2014 PROCEDURE:   Colonoscopy, diagnostic First Screening Colonoscopy - Avg.  risk and is 50 yrs.  old or older - No.  Prior Negative Screening - Now for repeat screening. N/A  History of Adenoma - Now for follow-up colonoscopy & has been > or = to 3 yrs.  Yes hx of adenoma.  Has been 3 or more years since last colonoscopy.  Polyps Removed Today? No.  Recommend repeat exam, <10 yrs? Yes.  High risk (family or personal hx). ASA CLASS:   Class III INDICATIONS:History of colonic adenomas.  Colonoscopy January 2007. I removed three colon polyps, one was tubulovillous, 10 mm.  Her next colonoscopy has been scheduled for January 2010.  Repeat colonoscopy March, 2010 found more adenomas, recall colonoscopy was set at 3 year interval.    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  stark while hospitalized.  He also found several small adenomatous polyps, removed them.  She needs recall colonoscopy 3 years from now.   She had also undergone EGD 11/12 one month prior for anemia, rectal bleeding.  This was normal per Dr.  Olevia Perches.  Colonoscopy 05/2011 Dr.  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  Now with lower abdominal pains.Marland Kitchen MEDICATIONS: Monitored anesthesia care  DESCRIPTION OF PROCEDURE:   After the risks benefits and alternatives of the procedure were thoroughly explained, informed consent was obtained.  The digital rectal exam revealed no abnormalities of the rectum.   The Pentax Ped Colon [id number] endoscope was introduced through the anus and advanced to the cecum, which was identified by both the appendix and  ileocecal valve. No adverse events experienced.   The quality of the prep was excellent.  The instrument was then slowly withdrawn as the colon was fully examined.  COLON FINDINGS: There were a few small diverticulum in the left colon.  The examination was otherwise normal.  Retroflexed views revealed no abnormalities. The time to cecum=3 minutes 00 seconds. Withdrawal time=10 minutes 00 seconds.  The scope was withdrawn and the procedure completed. COMPLICATIONS: There were no immediate complications.  ENDOSCOPIC IMPRESSION: There were a few small diverticulum in the left colon. The examination was otherwise normal  RECOMMENDATIONS: Given your personal history of adenomatous (pre-cancerous) polyps, you will need a repeat colonoscopy in 5 years.  eSigned:  Milus Banister, MD 02/16/2014 12:01 PM

## 2014-02-16 NOTE — Interval H&P Note (Signed)
History and Physical Interval Note:  02/16/2014 11:13 AM  [name] [name]  has presented today for surgery, with the diagnosis of history of polyps nausea and vomiting  The various methods of treatment have been discussed with the patient and family. After consideration of risks, benefits and other options for treatment, the patient has consented to  Procedure(s): ESOPHAGOGASTRODUODENOSCOPY (EGD) WITH PROPOFOL (N/A) COLONOSCOPY WITH PROPOFOL (N/A) as a surgical intervention .  The patient's history has been reviewed, patient examined, no change in status, stable for surgery.  I have reviewed the patient's chart and labs.  Questions were answered to the patient's satisfaction.     Milus Banister

## 2014-02-16 NOTE — Discharge Instructions (Signed)

## 2014-02-17 ENCOUNTER — Encounter (HOSPITAL_COMMUNITY): Payer: Self-pay | Admitting: Gastroenterology

## 2014-02-27 ENCOUNTER — Encounter (HOSPITAL_COMMUNITY): Payer: Medicare Other

## 2014-03-02 ENCOUNTER — Telehealth: Payer: Self-pay | Admitting: Gastroenterology

## 2014-03-02 NOTE — Telephone Encounter (Signed)
Dr. Ardis Hughs patient is requesting a rx for her ongoing abdominal pain and "inflammation".  Please advise

## 2014-03-03 ENCOUNTER — Ambulatory Visit: Payer: Medicare (Managed Care) | Admitting: Gastroenterology

## 2014-03-03 MED ORDER — SUCRALFATE 1 GM/10ML PO SUSP: 1.0000 g | Freq: Four times a day (QID) | ORAL | Status: DC

## 2014-03-03 NOTE — Telephone Encounter (Signed)
Currently her carafate is bid, i'd like her to increase to tid with meals and also at bedtime.  May need new script written, one month with 6 refills.  thnaks

## 2014-03-03 NOTE — Telephone Encounter (Signed)
Patient notified, rx sent.  

## 2014-03-20 ENCOUNTER — Encounter (HOSPITAL_COMMUNITY): Payer: Medicare Other

## 2014-03-23 ENCOUNTER — Other Ambulatory Visit (HOSPITAL_COMMUNITY): Payer: Self-pay | Admitting: *Deleted

## 2014-03-24 ENCOUNTER — Encounter (HOSPITAL_COMMUNITY)
Admission: RE | Admit: 2014-03-24 | Discharge: 2014-03-24 | Disposition: A | Discharge status: Home / Self Care | Payer: Medicare Other | Source: Ambulatory Visit | Attending: Rheumatology | Admitting: Rheumatology

## 2014-03-24 DIAGNOSIS — M810 Age-related osteoporosis without current pathological fracture: Secondary | ICD-10-CM | POA: Insufficient documentation

## 2014-03-24 LAB — CBC
HEMATOCRIT: 29.7 % — AB (ref 36.0–46.0)
HEMOGLOBIN: 9.4 g/dL — AB (ref 12.0–15.0)
MCH: 25.3 pg — ABNORMAL LOW (ref 26.0–34.0)
MCHC: 31.6 g/dL (ref 30.0–36.0)
MCV: 79.8 fL (ref 78.0–100.0)
Platelets: 268 10*3/uL (ref 150–400)
RBC: 3.72 MIL/uL — AB (ref 3.87–5.11)
RDW: 18.6 % — ABNORMAL HIGH (ref 11.5–15.5)
WBC: 7.7 10*3/uL (ref 4.0–10.5)

## 2014-03-24 LAB — COMPREHENSIVE METABOLIC PANEL
ALK PHOS: 51 U/L (ref 39–117)
ALT: 19 U/L (ref 0–35)
ANION GAP: 9 (ref 5–15)
AST: 38 U/L — ABNORMAL HIGH (ref 0–37)
Albumin: 3.2 g/dL — ABNORMAL LOW (ref 3.5–5.2)
BUN: 19 mg/dL (ref 6–23)
CALCIUM: 8.8 mg/dL (ref 8.4–10.5)
CO2: 24 mmol/L (ref 19–32)
Chloride: 110 mmol/L (ref 96–112)
Creatinine, Ser: 0.75 mg/dL (ref 0.50–1.10)
GFR, EST NON AFRICAN AMERICAN: 81 mL/min — AB (ref >90)
GLUCOSE: 80 mg/dL (ref 70–99)
Potassium: 4.1 mmol/L (ref 3.5–5.1)
Sodium: 143 mmol/L (ref 135–145)
Total Bilirubin: 0.8 mg/dL (ref 0.3–1.2)
Total Protein: 5.9 g/dL — ABNORMAL LOW (ref 6.0–8.3)

## 2014-03-24 LAB — CK TOTAL AND CKMB (NOT AT ARMC)
CK, MB: 12.3 ng/mL — AB (ref 0.3–4.0)
RELATIVE INDEX: 1.6 (ref 0.0–2.5)
Total CK: 782 U/L — ABNORMAL HIGH (ref 7–177)

## 2014-03-24 MED ORDER — HEPARIN SOD (PORK) LOCK FLUSH 100 UNIT/ML IV SOLN
INTRAVENOUS | Status: AC
  Filled 2014-03-24: qty 5

## 2014-03-24 MED ORDER — HEPARIN SOD (PORK) LOCK FLUSH 100 UNIT/ML IV SOLN
500.0000 [IU] | INTRAVENOUS | Status: AC | PRN
  Administered 2014-03-24: 500 [IU]

## 2014-03-24 NOTE — Progress Notes (Signed)
right PAC acessed per protocol; labs drawn; flushed with 10 ml NS followed by 5 mls Heparin(100 units/ml) pt. Tolerated well, no c/o; site unremarkable

## 2014-04-26 ENCOUNTER — Other Ambulatory Visit (HOSPITAL_COMMUNITY): Payer: Self-pay | Admitting: *Deleted

## 2014-04-27 ENCOUNTER — Encounter (HOSPITAL_COMMUNITY)
Admission: RE | Admit: 2014-04-27 | Discharge: 2014-04-27 | Disposition: A | Discharge status: Home / Self Care | Payer: Medicare Other | Source: Ambulatory Visit | Attending: Rheumatology | Admitting: Rheumatology

## 2014-04-27 DIAGNOSIS — M810 Age-related osteoporosis without current pathological fracture: Secondary | ICD-10-CM | POA: Insufficient documentation

## 2014-04-27 MED ORDER — HEPARIN SOD (PORK) LOCK FLUSH 100 UNIT/ML IV SOLN
500.0000 [IU] | INTRAVENOUS | Status: AC | PRN
  Administered 2014-04-27: 500 [IU]

## 2014-05-26 ENCOUNTER — Other Ambulatory Visit (HOSPITAL_COMMUNITY): Payer: Self-pay | Admitting: *Deleted

## 2014-05-29 ENCOUNTER — Encounter (HOSPITAL_COMMUNITY): Payer: Medicare Other

## 2014-06-08 ENCOUNTER — Other Ambulatory Visit (HOSPITAL_COMMUNITY): Payer: Self-pay | Admitting: *Deleted

## 2014-06-09 ENCOUNTER — Encounter (HOSPITAL_COMMUNITY)
Admission: RE | Admit: 2014-06-09 | Discharge: 2014-06-09 | Disposition: A | Discharge status: Home / Self Care | Payer: Medicare Other | Source: Ambulatory Visit | Attending: Rheumatology | Admitting: Rheumatology

## 2014-06-09 DIAGNOSIS — M810 Age-related osteoporosis without current pathological fracture: Secondary | ICD-10-CM | POA: Insufficient documentation

## 2014-06-09 MED ORDER — HEPARIN SOD (PORK) LOCK FLUSH 100 UNIT/ML IV SOLN: 500.0000 [IU] | INTRAVENOUS | Status: DC | PRN

## 2014-06-09 MED ORDER — HEPARIN SOD (PORK) LOCK FLUSH 100 UNIT/ML IV SOLN
INTRAVENOUS | Status: AC
  Administered 2014-06-09: 500 [IU]
  Filled 2014-06-09: qty 5

## 2014-06-09 NOTE — Progress Notes (Signed)
R chest PAC flushed per protocol. Tolerated well.

## 2014-06-14 ENCOUNTER — Other Ambulatory Visit: Payer: Self-pay | Admitting: Internal Medicine

## 2014-06-14 DIAGNOSIS — Z1231 Encounter for screening mammogram for malignant neoplasm of breast: Secondary | ICD-10-CM

## 2014-06-16 ENCOUNTER — Ambulatory Visit: Payer: Medicare Other

## 2014-06-26 ENCOUNTER — Ambulatory Visit: Payer: Medicare Other | Admitting: Podiatry

## 2014-07-03 ENCOUNTER — Ambulatory Visit (INDEPENDENT_AMBULATORY_CARE_PROVIDER_SITE_OTHER): Payer: Medicare Other | Admitting: Podiatry

## 2014-07-03 ENCOUNTER — Ambulatory Visit (INDEPENDENT_AMBULATORY_CARE_PROVIDER_SITE_OTHER): Payer: Medicare Other

## 2014-07-03 ENCOUNTER — Encounter: Payer: Self-pay | Admitting: Podiatry

## 2014-07-03 VITALS — BP 138/88 | HR 87 | Resp 12

## 2014-07-03 DIAGNOSIS — B351 Tinea unguium: Secondary | ICD-10-CM

## 2014-07-03 DIAGNOSIS — M1000 Idiopathic gout, unspecified site: Secondary | ICD-10-CM

## 2014-07-03 DIAGNOSIS — R52 Pain, unspecified: Secondary | ICD-10-CM

## 2014-07-03 DIAGNOSIS — M79606 Pain in leg, unspecified: Secondary | ICD-10-CM | POA: Diagnosis not present

## 2014-07-03 DIAGNOSIS — G629 Polyneuropathy, unspecified: Secondary | ICD-10-CM

## 2014-07-03 MED ORDER — GABAPENTIN 300 MG PO CAPS: 300.0000 mg | ORAL_CAPSULE | Freq: Two times a day (BID) | ORAL | Status: DC

## 2014-07-03 NOTE — Progress Notes (Signed)
   Subjective:    Patient ID: [name] [name], female    DOB: 10/03/1940, 74 y.o.   MRN: [id number]  HPI  PT STATED SEEN DR. ANDERSON AND DIAGNOSED GOUT FOR B/L FEET AND PAINFUL FOR 1 MONTH. FEET ARE GETTING WORSE ESPECIALLY AT NIGHT THEY THROBS. TRIED RX. ULORIC BUT NO HELP.  Review of Systems  Musculoskeletal: Positive for gait problem.  All other systems reviewed and are negative.      Objective:   Physical Exam        Assessment & Plan:

## 2014-07-04 NOTE — Progress Notes (Signed)
Subjective:     Patient ID: [name] [name], female   DOB: [birth year], 74 y.o.   MRN: [id number]  HPI patient is concerned because she has swelling in her ankles and she is in a wheelchair and she has just seen the rheumatologist to put her on allopurinol and Oratec and she was not able to take either. States her feet hurt mostly at nighttime and she can develop some shooting pain. Also has nail disease with thickness 1-5 both feet   Review of Systems     Objective:   Physical Exam  Neurovascular status unchanged with diminished range of motion subtalar midtarsal joint bilateral with nail disease and thickness 1-5 both feet with pain    Assessment:     Possible gout versus possible neuropathic like pain versus other unknown inflammatory condition. Patient also has nail disease with pain 1-5 both feet    Plan:     Reviewed conditions and at this time her to try her on low-dose gabapentin to see if this will make a difference for her at night and we may need to eventually increase the dose. I did explain her the risk of taking this and her caregiver and we will start 300 mg at nighttime and will be reevaluated again and are due to see the rheumatologist again on Wednesday. Debrided nailbeds 1-5 both feet with no iatrogenic bleeding noted

## 2014-07-10 ENCOUNTER — Other Ambulatory Visit (HOSPITAL_COMMUNITY): Payer: Self-pay

## 2014-07-11 ENCOUNTER — Encounter (HOSPITAL_COMMUNITY)
Admission: RE | Admit: 2014-07-11 | Discharge: 2014-07-11 | Disposition: A | Discharge status: Home / Self Care | Payer: Medicare Other | Source: Ambulatory Visit | Attending: Rheumatology | Admitting: Rheumatology

## 2014-07-11 DIAGNOSIS — M810 Age-related osteoporosis without current pathological fracture: Secondary | ICD-10-CM | POA: Diagnosis not present

## 2014-07-11 MED ORDER — HEPARIN SOD (PORK) LOCK FLUSH 100 UNIT/ML IV SOLN
500.0000 [IU] | Freq: Once | INTRAVENOUS | Status: AC
  Administered 2014-07-11: 500 [IU] via INTRAVENOUS

## 2014-07-11 MED ORDER — HEPARIN SOD (PORK) LOCK FLUSH 100 UNIT/ML IV SOLN
INTRAVENOUS | Status: AC
  Administered 2014-07-11: 500 [IU] via INTRAVENOUS
  Filled 2014-07-11: qty 5

## 2014-07-27 ENCOUNTER — Telehealth: Payer: Self-pay | Admitting: *Deleted

## 2014-07-27 MED ORDER — GABAPENTIN 100 MG PO CAPS: 100.0000 mg | ORAL_CAPSULE | Freq: Two times a day (BID) | ORAL | Status: DC

## 2014-07-27 NOTE — Telephone Encounter (Addendum)
Pt states the Gabapentin 300mg  twice daily is making her weak and drunk, can she have a lower dose?  Dr. Paulla Dolly ordered Gabapentin 100mg  #60 1 tablet bid 3 refills.  I informed pt.

## 2014-08-04 ENCOUNTER — Other Ambulatory Visit (HOSPITAL_COMMUNITY): Payer: Self-pay | Admitting: *Deleted

## 2014-08-07 ENCOUNTER — Inpatient Hospital Stay (HOSPITAL_COMMUNITY): Admission: RE | Admit: 2014-08-07 | Payer: Medicare Other | Source: Ambulatory Visit

## 2014-08-10 ENCOUNTER — Ambulatory Visit: Payer: Medicare Other

## 2014-08-14 ENCOUNTER — Encounter (HOSPITAL_COMMUNITY)
Admission: RE | Admit: 2014-08-14 | Discharge: 2014-08-14 | Disposition: A | Discharge status: Home / Self Care | Payer: Medicare Other | Source: Ambulatory Visit | Attending: Rheumatology | Admitting: Rheumatology

## 2014-08-14 DIAGNOSIS — M810 Age-related osteoporosis without current pathological fracture: Secondary | ICD-10-CM | POA: Insufficient documentation

## 2014-08-14 MED ORDER — HEPARIN SOD (PORK) LOCK FLUSH 100 UNIT/ML IV SOLN
500.0000 [IU] | Freq: Once | INTRAVENOUS | Status: AC
  Administered 2014-08-14: 500 [IU] via INTRAVENOUS

## 2014-09-20 ENCOUNTER — Encounter (HOSPITAL_COMMUNITY)
Admission: RE | Admit: 2014-09-20 | Discharge: 2014-09-20 | Disposition: A | Discharge status: Home / Self Care | Payer: Medicare Other | Source: Ambulatory Visit | Attending: Rheumatology | Admitting: Rheumatology

## 2014-09-20 DIAGNOSIS — M810 Age-related osteoporosis without current pathological fracture: Secondary | ICD-10-CM | POA: Insufficient documentation

## 2014-09-20 LAB — COMPREHENSIVE METABOLIC PANEL
ALBUMIN: 3.3 g/dL — AB (ref 3.5–5.0)
ALT: 34 U/L (ref 14–54)
AST: 46 U/L — AB (ref 15–41)
Alkaline Phosphatase: 41 U/L (ref 38–126)
Anion gap: 9 (ref 5–15)
BUN: 25 mg/dL — AB (ref 6–20)
CHLORIDE: 101 mmol/L (ref 101–111)
CO2: 26 mmol/L (ref 22–32)
CREATININE: 0.66 mg/dL (ref 0.44–1.00)
Calcium: 9.2 mg/dL (ref 8.9–10.3)
GFR calc Af Amer: >60 mL/min (ref >60)
GLUCOSE: 75 mg/dL (ref 65–99)
POTASSIUM: 3.7 mmol/L (ref 3.5–5.1)
SODIUM: 136 mmol/L (ref 135–145)
Total Bilirubin: 0.6 mg/dL (ref 0.3–1.2)
Total Protein: 5.7 g/dL — ABNORMAL LOW (ref 6.5–8.1)

## 2014-09-20 LAB — CBC
HCT: 31 % — ABNORMAL LOW (ref 36.0–46.0)
Hemoglobin: 9.7 g/dL — ABNORMAL LOW (ref 12.0–15.0)
MCH: 26.1 pg (ref 26.0–34.0)
MCHC: 31.3 g/dL (ref 30.0–36.0)
MCV: 83.3 fL (ref 78.0–100.0)
PLATELETS: 277 10*3/uL (ref 150–400)
RBC: 3.72 MIL/uL — AB (ref 3.87–5.11)
RDW: 17.8 % — AB (ref 11.5–15.5)
WBC: 8.2 10*3/uL (ref 4.0–10.5)

## 2014-09-20 LAB — LIPID PANEL
CHOL/HDL RATIO: 4 ratio
CHOLESTEROL: 295 mg/dL — AB (ref 0–200)
HDL: 73 mg/dL (ref >40)
LDL Cholesterol: 180 mg/dL — ABNORMAL HIGH (ref 0–99)
Triglycerides: 209 mg/dL — ABNORMAL HIGH (ref <150)
VLDL: 42 mg/dL — AB (ref 0–40)

## 2014-09-20 LAB — URIC ACID: URIC ACID, SERUM: 4.6 mg/dL (ref 2.3–6.6)

## 2014-09-20 MED ORDER — HEPARIN SOD (PORK) LOCK FLUSH 100 UNIT/ML IV SOLN
INTRAVENOUS | Status: AC
  Administered 2014-09-20: 500 [IU]
  Filled 2014-09-20: qty 5

## 2014-09-20 MED ORDER — HEPARIN SOD (PORK) LOCK FLUSH 100 UNIT/ML IV SOLN
500.0000 [IU] | INTRAVENOUS | Status: AC | PRN
  Administered 2014-09-20: 500 [IU]

## 2014-10-09 ENCOUNTER — Ambulatory Visit: Payer: Medicare Other

## 2014-10-12 ENCOUNTER — Ambulatory Visit: Payer: Medicare Other | Admitting: Podiatry

## 2014-10-13 ENCOUNTER — Other Ambulatory Visit: Payer: Self-pay | Admitting: Gastroenterology

## 2014-10-17 ENCOUNTER — Other Ambulatory Visit (HOSPITAL_COMMUNITY): Payer: Self-pay | Admitting: *Deleted

## 2014-10-18 ENCOUNTER — Encounter (HOSPITAL_COMMUNITY): Payer: Medicare Other

## 2014-10-20 ENCOUNTER — Other Ambulatory Visit (HOSPITAL_COMMUNITY): Payer: Self-pay | Admitting: *Deleted

## 2014-10-23 ENCOUNTER — Encounter (HOSPITAL_COMMUNITY)
Admission: RE | Admit: 2014-10-23 | Discharge: 2014-10-23 | Disposition: A | Discharge status: Home / Self Care | Payer: Medicare Other | Source: Ambulatory Visit | Attending: Rheumatology | Admitting: Rheumatology

## 2014-10-23 DIAGNOSIS — M810 Age-related osteoporosis without current pathological fracture: Secondary | ICD-10-CM | POA: Insufficient documentation

## 2014-10-23 MED ORDER — HEPARIN SOD (PORK) LOCK FLUSH 100 UNIT/ML IV SOLN
INTRAVENOUS | Status: AC
  Administered 2014-10-23: 500 [IU]
  Filled 2014-10-23: qty 5

## 2014-10-23 MED ORDER — HEPARIN SOD (PORK) LOCK FLUSH 100 UNIT/ML IV SOLN
500.0000 [IU] | INTRAVENOUS | Status: DC | PRN
  Administered 2014-10-23: 500 [IU]

## 2014-11-01 ENCOUNTER — Ambulatory Visit
Admission: RE | Admit: 2014-11-01 | Discharge: 2014-11-01 | Disposition: A | Discharge status: Home / Self Care | Payer: Medicare Other | Source: Ambulatory Visit | Attending: Internal Medicine | Admitting: Internal Medicine

## 2014-11-01 DIAGNOSIS — Z1231 Encounter for screening mammogram for malignant neoplasm of breast: Secondary | ICD-10-CM

## 2014-11-10 ENCOUNTER — Ambulatory Visit: Payer: Medicare Other | Admitting: Podiatry

## 2014-11-14 ENCOUNTER — Other Ambulatory Visit: Payer: Self-pay | Admitting: Gastroenterology

## 2014-11-20 ENCOUNTER — Encounter (HOSPITAL_COMMUNITY)
Admission: RE | Admit: 2014-11-20 | Discharge: 2014-11-20 | Disposition: A | Discharge status: Home / Self Care | Payer: Medicare Other | Source: Ambulatory Visit | Attending: Rheumatology | Admitting: Rheumatology

## 2014-11-20 DIAGNOSIS — M810 Age-related osteoporosis without current pathological fracture: Secondary | ICD-10-CM | POA: Insufficient documentation

## 2014-11-20 MED ORDER — HEPARIN SOD (PORK) LOCK FLUSH 100 UNIT/ML IV SOLN
INTRAVENOUS | Status: AC
  Administered 2014-11-20: 500 [IU]
  Filled 2014-11-20: qty 5

## 2014-11-20 MED ORDER — HEPARIN SOD (PORK) LOCK FLUSH 100 UNIT/ML IV SOLN
500.0000 [IU] | INTRAVENOUS | Status: AC | PRN
  Administered 2014-11-20: 500 [IU]

## 2014-11-29 ENCOUNTER — Ambulatory Visit: Payer: Medicare Other | Attending: Internal Medicine | Admitting: Physical Therapy

## 2014-11-29 ENCOUNTER — Encounter: Payer: Self-pay | Admitting: Physical Therapy

## 2014-11-29 DIAGNOSIS — R2991 Unspecified symptoms and signs involving the musculoskeletal system: Secondary | ICD-10-CM | POA: Insufficient documentation

## 2014-11-29 DIAGNOSIS — R531 Weakness: Secondary | ICD-10-CM | POA: Diagnosis not present

## 2014-11-29 DIAGNOSIS — Z7409 Other reduced mobility: Secondary | ICD-10-CM

## 2014-11-30 NOTE — Therapy (Signed)
[facility] [address] [place], [place], [address] Phone: [phone]   Fax:  [phone]  Physical Therapy Evaluation  Patient Details  Name: [name] [name] MRN: [id number] Date of Birth: 1940-03-14 Referring Provider: [name], MD  Encounter Date: 11/29/2014    Past Medical History  Diagnosis Date  . Benign neoplasm of colon   . Esophageal reflux   . Polymyositis ([place])   . Allergic rhinitis, cause unspecified   . Unspecified essential hypertension   . Hyperlipidemia   . Osteoporosis   . GI hemorrhage 2011    recurrent  . 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 Vitamin D deficiency     Past Surgical History  Procedure Laterality Date  . Lumbar laminectomy    . Bilateral  rotator cuff surgery    . Esophagogastroduodenoscopy  12/08/2010    others also  . Abdominal hysterectomy      partial  . Colonoscopy  01/09/2011    others also  . Eus    . Colonoscopy  05/29/2011    Procedure: COLONOSCOPY;  Surgeon: Gatha Mayer, MD;  Location: [facility];  Service: Endoscopy;  Laterality: N/A;  Greggory Brandy Carlean Purl  . Hot hemostasis  05/29/2011    Procedure: HOT HEMOSTASIS (ARGON PLASMA COAGULATION/BICAP);  Surgeon: Gatha Mayer, MD;  Location: [facility];  Service: Endoscopy;  Laterality: N/A;  . Tonsillectomy  age 64  . Esophagogastroduodenoscopy (egd) with propofol N/A 02/16/2014    Procedure: ESOPHAGOGASTRODUODENOSCOPY (EGD) WITH PROPOFOL;  Surgeon: Milus Banister, MD;  Location: [facility];  Service: Endoscopy;  Laterality: N/A;  . Colonoscopy with propofol N/A 02/16/2014    Procedure: COLONOSCOPY WITH PROPOFOL;  Surgeon: Milus Banister, MD;  Location: [facility];  Service: Endoscopy;  Laterality: N/A;    There were no vitals filed for this visit.  Visit Diagnosis:  Weakness generalized - Plan: PT plan of care cert/re-cert  Impaired transfers - Plan: PT plan of care cert/re-cert     Mobility/Seating Evaluation    PATIENT INFORMATION: Name: [name] [name] DOB: 08/07/1940  Sex: Female Date seen: 11/29/2014 Time: 8:45am  Address:  [address] [place] [address] Physician: [name], MD This evaluation/justification form will serve as the LMN for the following suppliers: __________________________ Supplier: [employer] Contact Person: ????? Phone:  ?????   Seating Therapist: Jamey Reas, PT, DPT Phone:   [phone]   Phone: [phone]    Spouse/Parent/Caregiver name: [name] [name], daughter  Phone number: [phone] Insurance/Payer: [facility] Medicare     Reason for Referral: Power Wheelchair Evaluation  Patient/Caregiver Goals: She would like a power wheelchair to increase comfort & sitting tolerance and to get around her house.   Patient was seen for face-to-face evaluation for new power wheelchair.  Also present was Mickie Kay, CNA to discuss recommendations and wheelchair options.  Further paperwork was completed and sent to vendor.  Patient appears to qualify for power mobility device at this time per objective findings.   MEDICAL HISTORY: Diagnosis: Primary Diagnosis: Polymyositis  Onset: 1998  Diagnosis: osteoporosis, DDD, HTN, lung disorder   '[]'$ Progressive Disease Relevant past and future surgeries: Lumbar Laminectomy 1998, Rotator Cuff Surgery 2011   Height: 5'3" Weight: 170# Explain recent changes or trends in weight: stable over last year   History including Falls: No falls    HOME ENVIRONMENT: $RemoveBeforeDEI'[]'kxySZeEvSeqRlkLn$ House  $Remo'[]'TvUOj$ Condo/town home  $Rem'[x]'UAVr$ Apartment  $RemoveBe'[]'HVZtTIfgR$ Assisted Living    '[x]'$ Lives Alone $RemoveBef'[]'fkIuxhAWts$  Lives with Others  Hours with caregiver: CNA 3hrs 3x/wk and son stays with her overnight  [x] Home is accessible to patient           Stairs      [] Yes [x]  No     Ramp [] Yes [x] No Comments:  She lives alone but son spends the night for safety in first floor apt with  curb and another step to enter and a 2-3" threshold at door.    COMMUNITY ADL: TRANSPORTATION: [] Car    [] Van    [] Public Transportation    [] Adapted w/c Lift    [] Ambulance    [] Other:       [] Sits in wheelchair during transport  Employment/School: ????? Specific requirements pertaining to mobility ?????  Other: ?????    FUNCTIONAL/SENSORY PROCESSING SKILLS:  Handedness:   [x] Right     [] Left    [] NA  Comments:  ?????  Functional Processing Skills for Wheeled Mobility [x] Processing Skills are adequate for safe wheelchair operation  Areas of concern than may interfere with safe operation of wheelchair Description of problem   []  Attention to environment      [] Judgment      []  Hearing  []  Vision or visual processing      [] Motor Planning  []  Fluctuations in Behavior  ?????    VERBAL COMMUNICATION: [x] WFL receptive [x]  WFL expressive [x] Understandable  [] Difficult to understand  [] non-communicative []  Uses an augmented communication device  CURRENT SEATING / MOBILITY: Current Mobility Base:  [] None [] Dependent [x] Manual [] Scooter [] Power  Type of Control: ?????  Manufacturer:  Bree[employer] Ultra Size:  20" X 16"Age: >5 yrs ago  Current Condition of Mobility Base:  Hammock seat & back, right wheel loose, worn out casters,   She reports she has a 74yrold [employer] power wheelchair wheelchair with poor seating system with inconsistent working. Poor condition.    Current Wheelchair components:  Flip-up wheelchair, swing away elevating legrests, right push-arm   Describe posture in present seating system:  kyphosis, head forward, rounded shoulders, sacral sitting      SENSATION and SKIN ISSUES: Sensation [x] Intact  [] Impaired [] Absent  Level of sensation: ????? Pressure Relief: Able to perform effective pressure relief :    [] Yes  [x]  No Method: ???? If not, Why?: UE strength not sufficient   Skin Issues/Skin Integrity Current Skin Issues  [] Yes [x] No [] Intact []  Red area[]  Open Area   [] Scar Tissue [] At risk from prolonged sitting Where  ?????  History of Skin Issues  [] Yes [x] No Where  ????? When  ?????  Hx of skin flap surgeries  [] Yes [x] No Where  ????? When  ?????  Limited sitting tolerance [x] Yes [] No Hours spent sitting in wheelchair daily: Sit in w/c ~10 hrs   Complaint of Pain:  Please describe: Buttocks from sitting in current wheelchair with poor seating system 8/10.    Swelling/Edema: none   ADL STATUS (in reference to wheelchair use):  Indep Assist Unable Indep with Equip Not assessed Comments  Dressing ????? ????? ????? X ????? Sitting on bed dresses upper & lower body  Eating X ????? ????? ????? ????? Sitting  Toileting ????? ????? ????? X ????? Handicap toilet with wheelchair, son has to assist when fatigue  Bathing ????? ????? ????? X ????? Sponge bath at edge of bed  Grooming/Hygiene ????? ????? X X ????? brushes teeth sitting, cannot manage hair without assist  Meal Prep ????? ????? ????? X ????? Sitting in wheelchair simple meals, reacher as current w/c does not enable to reach water or  stove controls.  IADLS X ????? ????? ????? ????? ?????  Bowel Management: [x] Continent  [] Incontinent  [] Accidents Comments:  ?????  Bladder Management: [x] Continent  [] Incontinent  [] Accidents Comments:  ?????     WHEELCHAIR SKILLS: Manual w/c Propulsion: [] UE or LE strength and endurance sufficient to participate in ADLs using manual wheelchair Arm : [] left [] right   [] Both      Distance: Unable due to severe UE weakness Foot:  [] left [] right   [] Both  Operate Scooter: []  Strength, hand grip, balance and transfer appropriate for use [] Living environment is accessible for use of scooter  Operate Power w/c:  [x]  Std. Joystick   []  Alternative Controls Indep [x]  Assist []  Dependent/unable []  N/A []   [x] Safe          []  Functional      Distance: ?????  Bed confined without wheelchair [x]  Yes []  No   STRENGTH/RANGE OF MOTION:  Passive Range of Motion  Strength  Shoulder Flexion ~100* bilaterally 2/5 flexion & abduction bilaterally  Elbow WFL  Extension 3-/5 bil. Flexion 2-/5 bil.  Wrist/Hand WFL grip right 4#, left 2#  Hip WFL in sitting  3-/5 hip flexion  Knee -15* extension, WFL flexion 3-/5 extension  Ankle WFL dorsiflexion 4/5     MOBILITY/BALANCE:  []  Patient is totally dependent for mobility  ?????    Balance Transfers Ambulation  Sitting Balance: Standing Balance: []  Independent []  Independent/Modified Independent  []  WFL     []  WFL []  Supervision []  Supervision  [x]  Uses UE for balance  []  Supervision []  Min Assist []  Ambulates with Assist  ?????    []  Min Assist []  Min assist [x]  Mod Assist []  Ambulates with Device:      []  RW  []  StW  []  Cane  []  ?????  []  Mod Assist []  Mod assist []  Max assist   []  Max Assist []  Max assist []  Dependent []  Indep. Short Distance Only  []  Unable [x]  Unable []  Lift / Sling Required Distance (in feet)  ?????   []  Sliding board [x]  Unable to Ambulate (see explanation below)  Cardio Status:  [x] Intact  []  Impaired   []  NA     ?????  Respiratory Status:  [x] Intact   [] Impaired   [] NA     ?????  Orthotics/Prosthetics: none  Comments (Address manual vs power w/c vs scooter): w/c to bed squat-pivot transfer moderate assist to clear wheel. She reports scooting level transfer with increased time at home. Sitting balance without UE support static only for 30 seconds and reaches to foot with UE support modified independent. Unable to stand or walk with walker due to severe weakness in LEs, trunk & UEs.         Anterior / Posterior Obliquity Rotation-Pelvis ?????  PELVIS    []  [x]  []   Neutral Posterior Anterior  [x]  []  []   WFL Rt elev Lt elev  [x]  []  []   WFL Right Left                      Anterior    Anterior     []  Fixed []  Other [x]  Partly Flexible []  Flexible   []  Fixed []  Other [x]  Partly Flexible  []  Flexible  []  Fixed []  Other [x]  Partly Flexible  []  Flexible   TRUNK   []  [x]  []   WFL ? Thoracic ? Lumbar  Kyphosis Lordosis  [x]  []  []   WFL Convex Convex  Right Left [] c-curve [] s-curve [] multiple  [x]  Neutral []   Left-anterior []  Right-anterior     [x]  Fixed []  Flexible []  Partly Flexible []  Other  []  Fixed [x]  Flexible []  Partly Flexible []  Other  []  Fixed             [x]  Flexible []  Partly Flexible []  Other    Position Windswept  ?????  HIPS          []            [x]               []    Neutral       Abduct        ADduct         [x]           []            []   Neutral Right           Left      []  Fixed []  Subluxed []  Partly Flexible []  Dislocated [x]  Flexible  []  Fixed []  Other []  Partly Flexible  [x]  Flexible                 Foot Positioning Knee Positioning  ?????    [x]  WFL  [] Lt [] Rt [x]  WFL  [] Lt [] Rt    KNEES ROM concerns: ROM concerns:    & Dorsi-Flexed [] Lt [] Rt ?????    FEET Plantar Flexed [] Lt [] Rt      Inversion                 [] Lt [] Rt      Eversion                 [] Lt [] Rt     HEAD [x]  Functional [x]  Good Head Control  ?????  & []  Flexed         []  Extended []  Adequate Head Control    NECK []  Rotated  Lt  []  Lat Flexed Lt []  Rotated  Rt []  Lat Flexed Rt []  Limited Head Control     []  Cervical Hyperextension []  Absent  Head Control     SHOULDERS ELBOWS WRIST& HAND ?????      Left     Right    Left     Right    Left     Right   U/E [x] Functional           [] Functional WNL WNL [] Fisting             [] Fisting      [] elev   [] dep      [] elev   [] dep       [x] pro -[] retract     [x] pro  [] retract [] subluxed             [x] subluxed           Goals for Wheelchair Mobility  [x]  Independence with mobility in the home with motor related ADLs (MRADLs)  [x]  Independence with MRADLs in the community [x]  Provide dependent mobility  []  Provide recline     [x] Provide tilt   Goals for Seating system [x]  Optimize pressure distribution [x]  Provide support needed to facilitate function or safety [x]  Provide corrective forces to  assist with maintaining or improving posture []  Accommodate client's posture:   current seated postures and positions are not flexible or will not tolerate corrective forces [x]  Client to be independent with relieving pressure in the wheelchair [x] Enhance physiological function such as breathing, swallowing, digestion  Simulation ideas/Equipment trials:????? State why other equipment was  unsuccessful:?????   MOBILITY BASE RECOMMENDATIONS and JUSTIFICATION: MOBILITY COMPONENT JUSTIFICATION  Manufacturer: PrideModel: J6 2SP-SS   Size: Width 16"Seat Depth 19" [x] provide transport from point A to B      [x] promote Indep mobility  [x] is not a safe, functional ambulator [x] walker or cane inadequate [x] non-standard width/depth necessary to accommodate anatomical measurement []  ?????  [] Manual Mobility Base [] non-functional ambulator    [] Scooter/POV  [] can safely operate  [] can safely transfer   [] has adequate trunk stability  [] cannot functionally propel manual w/c  [x] Power Mobility Base  [x] non-ambulatory  [x] cannot functionally propel manual wheelchair  [x]  cannot functionally and safely operate scooter/POV [x] can safely operate and willing to  [] Stroller Base [] infant/child  [] unable to propel manual wheelchair [] allows for growth [] non-functional ambulator [] non-functional UE [] Indep mobility is not a goal at this time  [x] Tilt  [] Forward [x] Backward [x] Powered tilt  [] Manual tilt  [x] change position against gravitational force on head and shoulders  [x] change position for pressure relief/cannot weight shift [x] transfers  [] management of tone [x] rest periods [] control edema [] facilitate postural control  []  ?????  [] Recline  [] Power recline on power base [] Manual recline on manual base  [] accommodate femur to back angle  [] bring to full recline for ADL care  [] change position for pressure relief/cannot weight shift [] rest periods [] repositioning for transfers or  clothing/diaper /catheter changes [] head positioning  [] Lighter weight required [] self- propulsion  [] lifting []  ?????  [] Heavy Duty required [] user weight greater than 250# [] extreme tone/ over active movement [] broken frame on previous chair []  ?????  [x]  Back  []  Angle Adjustable []  Custom molded TRU-Comfort 4-way stretch back [x] postural control [] control of tone/spasticity [] accommodation of range of motion [] UE functional control [x] accommodation for seating system []  ????? [x] provide lateral trunk support [] accommodate deformity [x] provide posterior trunk support [x] provide lumbar/sacral support [x] support trunk in midline [x] Pressure relief over spinal processes  [x]  Seat Cushion Positioning cushion Spectrum Gel cushion  [x] impaired sensation  [] decubitus ulcers present [] history of pressure ulceration [x] prevent pelvic extension [x] low maintenance  [x] stabilize pelvis  [] accommodate obliquity [] accommodate multiple deformity [x] neutralize lower extremity position [x] increase pressure distribution [x]  16" X 20"   []  Pelvic/thigh support  []  Lateral thigh guide []  Distal medial pad  []  Distal lateral pad []  pelvis in neutral [] accommodate pelvis []  position upper legs []  alignment []  accommodate ROM []  decr adduction [] accommodate tone [] removable for transfers [] decr abduction  []  Lateral trunk Supports []  Lt     []  Rt [] decrease lateral trunk leaning [] control tone [] contour for increased contact [] safety  [] accommodate asymmetry []  ?????  [x]  Mounting hardware  [] lateral trunk supports  [x] back   [x] seat [x] headrest      []  thigh support [x] fixed   [] swing away [x] attach seat platform/cushion to w/c frame [x] attach back cushion to w/c frame [] mount postural supports [x] mount headrest  [] swing medial thigh support away [] swing lateral supports away for transfers  []  ?????    Armrests  [] fixed [x] adjustable height [x] removable   [] swing away  [x] flip  back   [] reclining [x] full length pads [] desk    [] pads tubular  [x] provide support with elbow at 90   [] provide support for w/c tray [x] change of height/angles for variable activities [x] remove for transfers [x] allow to come closer to table top [x] remove for access to tables []  ?????  Hangers/ Leg rests  [] 60 [] 70 [] 90 [] elevating [] heavy duty  [] articulating [] fixed [] lift off [] swing away     [] power [] provide LE support  [] accommodate to hamstring tightness [] elevate legs during recline   [] provide change in position  for Legs [] Maintain placement of feet on footplate [] durability [] enable transfers [] decrease edema [] Accommodate lower leg length []  ?????  Foot support Footplate    [] Lt  []  Rt  [x]  Center mount [x] flip up     [] depth/angle adjustable [] Amputee adapter    []  Lt     []  Rt [x] provide foot support [] accommodate to ankle ROM [x] transfers [] Provide support for residual extremity [x]  allow foot to go under wheelchair base []  decrease tone  [x]  Calf Pads to support lower legs during tilt  []  Ankle strap/heel loops [] support foot on foot support [] decrease extraneous movement [] provide input to heel  [] protect foot  Tires: [x] pneumatic  [x] flat free inserts  [] solid  [x] decrease maintenance  [x] prevent frequent flats [] increase shock absorbency [] decrease pain from road shock [] decrease spasms from road shock []  ?????  [x]  Headrest  [x] provide posterior head support [] provide posterior neck support [] provide lateral head support [] provide anterior head support [x] support during tilt and recline [] improve feeding   [] improve respiration [] placement of switches [x] safety  [] accommodate ROM  [] accommodate tone [x] improve visual orientation  []  Anterior chest strap []  Vest []  Shoulder retractors  [] decrease forward movement of shoulder [] accommodation of TLSO [] decrease forward movement of trunk [] decrease shoulder elevation [] added abdominal  support [] alignment [] assistance with shoulder control  []  ?????  Pelvic Positioner [x] Belt [] SubASIS bar [] Dual Pull [] stabilize tone [x] decrease falling out of chair/ **will not Decr potential for sliding due to pelvic tilting [] prevent excessive rotation [] pad for protection over boney prominence [] prominence comfort [] special pull angle to control rotation []  ?????  Upper Extremity Support [] L   []  R [] Arm trough    [] hand support []  tray       [] full tray [] swivel mount [] decrease edema      [] decrease subluxation   [] control tone   [] placement for AAC/Computer/EADL [] decrease gravitational pull on shoulders [] provide midline positioning [] provide support to increase UE function [] provide hand support in natural position [] provide work surface   POWER WHEELCHAIR CONTROLS  [x] Proportional  [] Non-Proportional Type Through NE+6 Key [] Left  [x] Right [x] provides access for controlling wheelchair   [] lacks motor control to operate proportional drive control [] unable to understand proportional controls  Actuator Control Module  [x] Single  [] Multiple   [x] Allow the client to operate the power seat function(s) through the joystick control   [] Safety Reset Switches [] Used to change modes and stop the wheelchair when driving in latch mode    [] Upgraded Electronics   [] programming for accurate control [] progressive Disease/changing condition [] non-proportional drive control needed [] Needed in order to operate power seat functions through joystick control   [] Display box [] Allows user to see in which mode and drive the wheelchair is set  [] necessary for alternate controls    [] Digital interface electronics [] Allows w/c to operate when using alternative drive controls  [] ASL Head Array [] Allows client to operate wheelchair  through switches placed in tri-panel headrest  [] Sip and puff with tubing kit [] needed to operate sip and puff drive controls  [] Upgraded tracking electronics  [] increase safety when driving [] correct tracking when on uneven surfaces  [x] Mount for switches or joystick [x] Attaches switches to w/c  [] Swing away for access or transfers [] midline for optimal placement [] provides for consistent access  [] Attendant controlled joystick plus mount [] safety [] long distance driving [] operation of seat functions [] compliance with transportation regulations []  ?????    Rear wheel placement/Axle adjustability [] None [] semi adjustable [] fully adjustable  [] improved UE access to wheels [] improved stability [] changing angle in space for improvement of postural stability [] 1-arm  drive access [] amputee pad placement []  ?????  Wheel rims/ hand rims  [] metal  [] plastic coated [] oblique projections [] vertical projections [] Provide ability to propel manual wheelchair  []  Increase self-propulsion with hand weakness/decreased grasp  Push handles [] extended  [] angle adjustable  [] standard [] caregiver access [] caregiver assist [] allows "hooking" to enable increased ability to perform ADLs or maintain balance  One armed device  [] Lt   [] Rt [] enable propulsion of manual wheelchair with one arm   []  ?????   Brake/wheel lock extension []  Lt   []  Rt [] increase indep in applying wheel locks   [] Side guards [] prevent clothing getting caught in wheel or becoming soiled []  prevent skin tears/abrasions  Battery: 22NF X 2  [x] to power wheelchair ?????  Other: ????? ????? ?????  The above equipment has a life- long use expectancy. Growth and changes in medical and/or functional conditions would be the exceptions. This is to certify that the therapist has no financial relationship with durable medical provider or manufacturer. The therapist will not receive remuneration of any kind for the equipment recommended in this evaluation.   Patient has mobility limitation that significantly impairs safe, timely participation in one or more mobility related ADL's.  (bathing, toileting,  feeding, dressing, grooming, moving from room to room)                                                             [x]  Yes []  No Will mobility device sufficiently improve ability to participate and/or be aided in participation of MRADL's?         [x]  Yes []  No Can limitation be compensated for with use of a cane or walker?                                                                                []  Yes [x]  No Does patient or caregiver demonstrate ability/potential ability & willingness to safely use the mobility device?   [x]  Yes []  No Does patient's home environment support use of recommended mobility device?                                                    [x]  Yes []  No Does patient have sufficient upper extremity function necessary to functionally propel a manual wheelchair?    []  Yes [x]  No Does patient have sufficient strength and trunk stability to safely operate a POV (scooter)?                                  []  Yes [x]  No Does patient need additional features/benefits provided by a power wheelchair for MRADL's in the home?       [x]  Yes []  No Does the patient demonstrate the ability to safely use a power wheelchair?                                                              [  x] Yes []  No  Therapist Name Printed: Jamey Reas, PT, DPT Date: 05-Dec-2014  Therapist's Signature:   Date:   Supplier's Name Printed: [employer], [employer] Date: 12-05-2014  Supplier's Signature:   Date:  Patient/Caregiver Signature:   Date:     This is to certify that I have read this evaluation and do agree with the content within:    Physician's Name Printed: [name], MD  Physician's Signature:  Date:     This is to certify that I, the above signed therapist have the following affiliations: []  This DME provider []  Manufacturer of recommended equipment []  Patient's lg tm c     [facility] PT Assessment - 11/30/14 0001    Assessment   Referring Provider [name], MD    [place]  [place]  t  abe                           G-Codes - 12/05/14 1015    Functional Assessment Tool Used ModA transfers over 2" of top of wheel of wheelchair. Non-ambulatory.   Functional Limitation Changing and maintaining body position   Changing and Maintaining Body Position Current Status [phone]) At least 60 percent but less than 80 percent impaired, limited or restricted   Changing and Maintaining Body Position Goal Status (P1121) At least 60 percent but less than 80 percent impaired, limited or restricted   Changing and Maintaining Body Position Discharge Status (K2446) At least 60 percent but less than 80 percent impaired, limited or restricted    estricted       P Patient Active Problem List   Diagnosis Date Noted  . Abdominal pain, unspecified site 05/17/2013  . Nausea alone 05/17/2013  . 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 Abdominal pain, left lower quadrant 05/28/2011  . Cecal ulcer with hemorrhage 01/10/2011  . Acute blood loss anemia 12/06/2010  . Hematochezia 12/05/2010  . Anemia 12/05/2010  . Adrenal insufficiency ([place]) 12/05/2010  . Osteoporosis 12/05/2010  . LUNG INVOLVEMENT OTHER DISEASES CLASSIFIED ELSW 08/13/2009  . OTHER SPECIFIED DISORDER OF STOMACH AND DUODENUM 07/06/2008  . HYPERTENSION 04/26/2007  . ALLERGIC RHINITIS 04/26/2007  . GASTROESOPHAGEAL REFLUX DISEASE 04/26/2007  . Polymyositis ([place]) 04/26/2007  . COLONIC POLYPS, ADENOMATOUS 02/06/2005   01/2WALDRON,ROBINDRON,ROBIN11/17/20161/111:37 AM11:07 AM  [id number] [phone]   [place] OIP:[id number] Date of Birth: June 15, 1940

## 2014-12-18 ENCOUNTER — Encounter (HOSPITAL_COMMUNITY)
Admission: RE | Admit: 2014-12-18 | Discharge: 2014-12-18 | Disposition: A | Discharge status: Home / Self Care | Payer: Medicare Other | Source: Ambulatory Visit | Attending: Rheumatology | Admitting: Rheumatology

## 2014-12-18 DIAGNOSIS — M810 Age-related osteoporosis without current pathological fracture: Secondary | ICD-10-CM | POA: Insufficient documentation

## 2014-12-18 MED ORDER — HEPARIN SOD (PORK) LOCK FLUSH 100 UNIT/ML IV SOLN: 500.0000 [IU] | INTRAVENOUS | Status: DC | PRN

## 2014-12-18 MED ORDER — HEPARIN SOD (PORK) LOCK FLUSH 100 UNIT/ML IV SOLN
INTRAVENOUS | Status: AC
  Filled 2014-12-18: qty 5

## 2014-12-25 ENCOUNTER — Emergency Department (HOSPITAL_COMMUNITY)
Admission: EM | Admit: 2014-12-25 | Discharge: 2014-12-25 | Disposition: A | Discharge status: Home / Self Care | Payer: Medicare Other | Attending: Emergency Medicine | Admitting: Emergency Medicine

## 2014-12-25 ENCOUNTER — Encounter (HOSPITAL_COMMUNITY): Payer: Self-pay | Admitting: Family Medicine

## 2014-12-25 ENCOUNTER — Emergency Department (HOSPITAL_COMMUNITY): Payer: Medicare Other

## 2014-12-25 DIAGNOSIS — M25531 Pain in right wrist: Secondary | ICD-10-CM | POA: Diagnosis present

## 2014-12-25 DIAGNOSIS — I10 Essential (primary) hypertension: Secondary | ICD-10-CM | POA: Insufficient documentation

## 2014-12-25 DIAGNOSIS — Z8612 Personal history of poliomyelitis: Secondary | ICD-10-CM | POA: Insufficient documentation

## 2014-12-25 DIAGNOSIS — E559 Vitamin D deficiency, unspecified: Secondary | ICD-10-CM | POA: Diagnosis not present

## 2014-12-25 DIAGNOSIS — M10032 Idiopathic gout, left wrist: Secondary | ICD-10-CM | POA: Insufficient documentation

## 2014-12-25 DIAGNOSIS — Z7952 Long term (current) use of systemic steroids: Secondary | ICD-10-CM | POA: Insufficient documentation

## 2014-12-25 DIAGNOSIS — Z86018 Personal history of other benign neoplasm: Secondary | ICD-10-CM | POA: Insufficient documentation

## 2014-12-25 DIAGNOSIS — M810 Age-related osteoporosis without current pathological fracture: Secondary | ICD-10-CM | POA: Diagnosis not present

## 2014-12-25 DIAGNOSIS — Z79899 Other long term (current) drug therapy: Secondary | ICD-10-CM | POA: Diagnosis not present

## 2014-12-25 DIAGNOSIS — Z7951 Long term (current) use of inhaled steroids: Secondary | ICD-10-CM | POA: Diagnosis not present

## 2014-12-25 DIAGNOSIS — K219 Gastro-esophageal reflux disease without esophagitis: Secondary | ICD-10-CM | POA: Insufficient documentation

## 2014-12-25 MED ORDER — OXYCODONE-ACETAMINOPHEN 5-325 MG PO TABS: 1.0000 | ORAL_TABLET | Freq: Four times a day (QID) | ORAL | Status: DC | PRN

## 2014-12-25 MED ORDER — COLCHICINE 0.6 MG PO TABS: 0.6000 mg | ORAL_TABLET | Freq: Every day | ORAL | Status: DC

## 2014-12-25 MED ORDER — OXYCODONE-ACETAMINOPHEN 5-325 MG PO TABS
1.0000 | ORAL_TABLET | Freq: Once | ORAL | Status: AC
  Administered 2014-12-25: 1 via ORAL
  Filled 2014-12-25: qty 1

## 2014-12-25 MED ORDER — INDOMETHACIN 50 MG PO CAPS: 50.0000 mg | ORAL_CAPSULE | Freq: Three times a day (TID) | ORAL | Status: DC

## 2014-12-25 NOTE — Discharge Instructions (Signed)
1. Medications: Indomethacin and colchicine will help improve your wrist, percocet as needed for pain control - This can make you very drowsy - please do not drink or drive on this medication, continue usual home medications 2. Treatment: rest, drink plenty of fluids, follow low purine diet attached. 3. Follow Up: Please follow up with your primary doctor in 3 days for discussion of your diagnoses and further evaluation after today's visit; Please return to the ER for any new or worsening symptoms, any additional concerns.    Low-Purine Diet Purines are compounds that affect the level of uric acid in your body. A low-purine diet is a diet that is low in purines. Eating a low-purine diet can prevent the level of uric acid in your body from getting too high and causing gout or kidney stones or both. WHAT DO I NEED TO KNOW ABOUT THIS DIET?  Choose low-purine foods. Examples of low-purine foods are listed in the next section.  Drink plenty of fluids, especially water. Fluids can help remove uric acid from your body. Try to drink 8-16 cups (1.9-3.8 L) a day.  Limit foods high in fat, especially saturated fat, as fat makes it harder for the body to get rid of uric acid. Foods high in saturated fat include pizza, cheese, ice cream, whole milk, fried foods, and gravies. Choose foods that are lower in fat and lean sources of protein. Use olive oil when cooking as it contains healthy fats that are not high in saturated fat.  Limit alcohol. Alcohol interferes with the elimination of uric acid from your body. If you are having a gout attack, avoid all alcohol.  Keep in mind that different people's bodies react differently to different foods. You will probably learn over time which foods do or do not affect you. If you discover that a food tends to cause your gout to flare up, avoid eating that food. You can more freely enjoy foods that do not cause problems. If you have any questions about a food item, talk to  your dietitian or health care provider. WHICH FOODS ARE LOW, MODERATE, AND HIGH IN PURINES? The following is a list of foods that are low, moderate, and high in purines. You can eat any amount of the foods that are low in purines. You may be able to have small amounts of foods that are moderate in purines. Ask your health care provider how much of a food moderate in purines you can have. Avoid foods high in purines. Grains  Foods low in purines: Enriched white bread, pasta, rice, cake, cornbread, popcorn.  Foods moderate in purines: Whole-grain breads and cereals, wheat germ, bran, oatmeal. Uncooked oatmeal. Dry wheat bran or wheat germ.  Foods high in purines: Pancakes, [place] toast, biscuits, muffins. Vegetables  Foods low in purines: All vegetables, except those that are moderate in purines.  Foods moderate in purines: Asparagus, cauliflower, spinach, mushrooms, green peas. Fruits  All fruits are low in purines. Meats and other Protein Foods  Foods low in purines: Eggs, nuts, peanut butter.  Foods moderate in purines: 80-90% lean beef, lamb, veal, pork, poultry, fish, eggs, peanut butter, nuts. Crab, lobster, oysters, and shrimp. Cooked dried beans, peas, and lentils.  Foods high in purines: Anchovies, sardines, herring, mussels, tuna, codfish, scallops, trout, and haddock. Berniece Salines. Organ meats (such as liver or kidney). Tripe. Game meat. Goose. Sweetbreads. Dairy  All dairy foods are low in purines. Low-fat and fat-free dairy products are best because they are low in saturated  fat. Beverages  Drinks low in purines: Water, carbonated beverages, tea, coffee, cocoa.  Drinks moderate in purines: Soft drinks and other drinks sweetened with high-fructose corn syrup. Juices. To find whether a food or drink is sweetened with high-fructose corn syrup, look at the ingredients list.  Drinks high in purines: Alcoholic beverages (such as beer). Condiments  Foods low in purines: Salt, herbs,  olives, pickles, relishes, vinegar.  Foods moderate in purines: Butter, margarine, oils, mayonnaise. Fats and Oils  Foods low in purines: All types, except gravies and sauces made with meat.  Foods high in purines: Gravies and sauces made with meat. Other Foods  Foods low in purines: Sugars, sweets, gelatin. Cake. Soups made without meat.  Foods moderate in purines: Meat-based or fish-based soups, broths, or bouillons. Foods and drinks sweetened with high-fructose corn syrup.  Foods high in purines: High-fat desserts (such as ice cream, cookies, cakes, pies, doughnuts, and chocolate). Contact your dietitian for more information on foods that are not listed here.   This information is not intended to replace advice given to you by your health care provider. Make sure you discuss any questions you have with your health care provider.   Document Released: 04/26/2010 Document Revised: 01/04/2013 Document Reviewed: 12/06/2012 Elsevier Interactive Patient Education [employer].

## 2014-12-25 NOTE — ED Notes (Signed)
Pt is in stable condition upon d/c and is escorted from ED via wheelchair. 

## 2014-12-25 NOTE — ED Provider Notes (Signed)
CSN: [id number]     Arrival date & time 12/25/14  1123 History   First MD Initiated Contact with Patient 12/25/14 1553     Chief Complaint  Patient presents with  . Hand Problem  . Wrist Pain     (Consider location/radiation/quality/duration/timing/severity/associated sxs/prior Treatment) Patient is a 74 y.o. female presenting with wrist pain. The history is provided by the patient and medical records. No language interpreter was used.  Wrist Pain Associated symptoms include arthralgias and joint swelling. Pertinent negatives include no abdominal pain, congestion, coughing, headaches, nausea, neck pain, rash, sore throat, vomiting or weakness.   [name] [name] is a 74 y.o. female  Who presents to the Emergency Department complaining of worsening left hand pain and swelling. Patient states she woke up Thursday morning with left hand swelling, pain, and erythema. Denies any trauma/injury or increased activity. Patient states she was told she had gout in her right hand in the past. Tylenol taken for pain with little relief. No other alleviating factors noted. Aggravated by movement. Denies fever, chills.   Past Medical History  Diagnosis Date  . Benign neoplasm of colon   . Esophageal reflux   . Polymyositis ([place])   . Allergic rhinitis, cause unspecified   . Unspecified essential hypertension   . Hyperlipidemia   . Osteoporosis   . GI hemorrhage 2011    recurrent  . 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 Vitamin D deficiency    Past Surgical History  Procedure Laterality Date  . Lumbar laminectomy    . Bilateral  rotator cuff surgery    . Esophagogastroduodenoscopy  12/08/2010    others also  . Abdominal hysterectomy      partial  . Colonoscopy  01/09/2011    others also  . Eus    . Colonoscopy  05/29/2011    Procedure: COLONOSCOPY;  Surgeon: Gatha Mayer, MD;  Location: [facility];  Service: Endoscopy;  Laterality: N/A;  Greggory Brandy Carlean Purl  . Hot hemostasis  05/29/2011     Procedure: HOT HEMOSTASIS (ARGON PLASMA COAGULATION/BICAP);  Surgeon: Gatha Mayer, MD;  Location: [facility];  Service: Endoscopy;  Laterality: N/A;  . Tonsillectomy  age 1  . Esophagogastroduodenoscopy (egd) with propofol N/A 02/16/2014    Procedure: ESOPHAGOGASTRODUODENOSCOPY (EGD) WITH PROPOFOL;  Surgeon: Milus Banister, MD;  Location: [facility];  Service: Endoscopy;  Laterality: N/A;  . Colonoscopy with propofol N/A 02/16/2014    Procedure: COLONOSCOPY WITH PROPOFOL;  Surgeon: Milus Banister, MD;  Location: [facility];  Service: Endoscopy;  Laterality: N/A;   Family History  Problem Relation Age of Onset  . Dementia Mother   . Hypertension Father   . Malignant hyperthermia Father   . Colon cancer Neg Hx    Social History  Substance Use Topics  . Smoking status: Never Smoker   . Smokeless tobacco: Current User    Types: Snuff     Comment: Tobacco info given 05/11/13  . Alcohol Use: No   OB History    No data available     Review of Systems  Constitutional: Negative.   HENT: Negative for congestion, rhinorrhea and sore throat.   Eyes: Negative for visual disturbance.  Respiratory: Negative for cough, shortness of breath and wheezing.   Cardiovascular: Negative.   Gastrointestinal: Negative for nausea, vomiting, abdominal pain, diarrhea and constipation.  Genitourinary: Negative for dysuria.  Musculoskeletal: Positive for joint swelling and arthralgias. Negative for back pain and neck pain.  Skin: Negative  for rash.  Neurological: Negative for dizziness, weakness and headaches.      Allergies  Codeine and Hydrocodone  Home Medications   Prior to Admission medications   Medication Sig Start Date End Date Taking? Authorizing Provider  ALPRAZolam (XANAX) 0.25 MG tablet Take 0.25 mg by mouth 3 (three) times daily as needed. Anxiety and sleep   Yes Historical Provider, MD  Ascorbic Acid (VITAMIN C) 1000 MG tablet Take 1,000 mg by mouth daily.    Yes Historical  Provider, MD  atenolol (TENORMIN) 50 MG tablet Take 50 mg by mouth daily.     Yes Historical Provider, MD  Calcium Carbonate (CALTRATE 600) 1500 MG TABS Take 2 tablets by mouth daily. Daily   Yes Historical Provider, MD  ergocalciferol (VITAMIN D2) 50000 UNITS capsule Take 50,000 Units by mouth 2 (two) times a week. Takes 1 tablet every Monday and friday   Yes Historical Provider, MD  esomeprazole (NEXIUM) 40 MG capsule Take one capsule by thirty minutes before breakfast Take one capsule by thirty minutes before dinner Patient taking differently: Take 40 mg by mouth daily. Take one capsule by thirty minutes before breakfast Take one capsule by thirty minutes before dinner 05/11/13  Yes Jessica D Zehr, PA-C  fluticasone (FLONASE) 50 MCG/ACT nasal spray Place 1 spray into both nostrils daily. 08/06/11  Yes Historical Provider, MD  folic acid (FOLVITE) 222 MCG tablet Take 400 mcg by mouth daily.     Yes Historical Provider, MD  furosemide (LASIX) 40 MG tablet Take 40 mg by mouth daily.     Yes Historical Provider, MD  gabapentin (NEURONTIN) 100 MG capsule Take 1 capsule (100 mg total) by mouth 2 (two) times daily. 07/27/14  Yes Tamala Fothergill Regal, DPM  loratadine (CLARITIN) 10 MG tablet Take 10 mg by mouth daily as needed. For allergies.   Yes Historical Provider, MD  potassium chloride SA (K-DUR,KLOR-CON) 20 MEQ tablet Take 40 mEq by mouth daily.    Yes Historical Provider, MD  predniSONE (DELTASONE) 10 MG tablet Take 5 mg by mouth daily.    Yes Historical Provider, MD  psyllium (HYDROCIL/METAMUCIL) 95 % PACK Take 1 packet by mouth daily. 05/15/11  Yes [name], MD  sodium chloride (OCEAN) 0.65 % nasal spray Place 1 spray into the nose as needed. For allergies.   Yes Historical Provider, MD  sucralfate (CARAFATE) 1 GM/10ML suspension Take 1 g by mouth daily as needed. For ulcers   Yes Historical Provider, MD  vitamin A 8000 UNIT capsule Take 8,000 Units by mouth daily.   Yes Historical Provider, MD   vitamin B-12 (CYANOCOBALAMIN) 1000 MCG tablet Take 1,000 mcg by mouth daily.     Yes Historical Provider, MD  vitamin E 600 UNIT capsule Take 600 Units by mouth daily.     Yes Historical Provider, MD  CARAFATE 1 GM/10ML suspension TAKE 10 MLS BY MOUTH FOUR TIMES DAILY Patient not taking: Reported on 12/25/2014 10/13/14   Ladene Artist, MD  colchicine 0.6 MG tablet Take 1 tablet (0.6 mg total) by mouth daily. First dose, take two tablets (1.59m total). Then take one tablet (0.6 mg total) daily. 12/25/14   JOzella AlmondWard, PA-C  indomethacin (INDOCIN) 50 MG capsule Take 1 capsule (50 mg total) by mouth 3 (three) times daily with meals. 12/25/14   Savannah Erbe Pilcher Coraline Talwar, PA-C  MOVIPREP 100 G SOLR Take 1 kit (200 g total) by mouth once. Patient not taking: Reported on 11/29/2014 01/17/14   DMilus Banister  MD  ondansetron (ZOFRAN ODT) 8 MG disintegrating tablet Take 1 tablet (8 mg total) by mouth every 8 (eight) hours as needed for nausea or vomiting. Patient not taking: Reported on 12/25/2014 04/30/13   Pattricia Boss, MD  oxyCODONE-acetaminophen (PERCOCET/ROXICET) 5-325 MG tablet Take 1 tablet by mouth every 6 (six) hours as needed for severe pain. 12/25/14   Devontaye Ground Pilcher Belissa Kooy, PA-C   BP 165/148 mmHg  Pulse 83  Temp(Src) 98.1 F (36.7 C) (Oral)  Resp 16  SpO2 100% Physical Exam  Constitutional: She is oriented to person, place, and time. She appears well-developed and well-nourished.  Alert and in no acute distress  HENT:  Head: Normocephalic and atraumatic.  Cardiovascular: Normal rate, regular rhythm, normal heart sounds and intact distal pulses.  Exam reveals no gallop and no friction rub.   No murmur heard. Pulmonary/Chest: Effort normal and breath sounds normal. No respiratory distress. She has no wheezes. She has no rales. She exhibits no tenderness.  Abdominal: She exhibits no mass. There is no rebound and no guarding.  Abdomen soft, non-tender, non-distended Bowel sounds positive in  all four quadrants  Musculoskeletal: She exhibits no edema.  Left hand with +swelling, + erythema, warm to the touch, and decreased ROM of hand joints 2/2 pain and swelling  Neurological: She is alert and oriented to person, place, and time.  LUE neurovascularly intact  Skin: Skin is warm and dry. No rash noted.  Psychiatric: She has a normal mood and affect. Her behavior is normal. Judgment and thought content normal.  Nursing note and vitals reviewed.   ED Course  Procedures (including critical care time) Labs Review Labs Reviewed - No data to display  Imaging Review Dg Forearm Left  12/25/2014  CLINICAL DATA:  74 year old female with history of swelling in the left hand and arm the past 4 days, with slight discoloration of the left hand. EXAM: LEFT FOREARM - 2 VIEW COMPARISON:  No priors. FINDINGS: There is no evidence of fracture or other focal bone lesions. Soft tissues are unremarkable. IMPRESSION: Negative. Electronically Signed   By: Vinnie Langton M.D.   On: 12/25/2014 13:50   Dg Wrist Complete Left  12/25/2014  CLINICAL DATA:  Left hand swelling EXAM: LEFT WRIST - COMPLETE 3+ VIEW COMPARISON:  None. FINDINGS: Negative for fracture. Normal alignment. Generalized osteopenia. Joint spaces are maintained. No erosion. IMPRESSION: No acute abnormality. Electronically Signed   By: Franchot Gallo M.D.   On: 12/25/2014 13:38   Dg Hand Complete Left  12/25/2014  CLINICAL DATA:  Swelling, no known injury. EXAM: LEFT HAND - COMPLETE 3+ VIEW COMPARISON:  None. FINDINGS: There is no acute fracture or dislocation. There is generalized osteopenia. There is no erosive change. 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 There is soft tissue swelling over the dorsal aspect of the left hand. IMPRESSION: Severe osteopenia.  No acute osseous injury of the left hand. Soft tissue swelling over the dorsal aspect of the left hand. Electronically Signed   By: Kathreen Devoid   On: 12/25/2014 13:37    I have personally reviewed and evaluated these images and lab results as part of my medical decision-making.   EKG Interpretation None      MDM   Final diagnoses:  Acute idiopathic gout of left wrist   [name] [name] presents with acute onset of left hand pain and swelling.  Imaging: hand, wrist, and forearm x-rays with no acute abnormality; soft tissue swelling of hand.   Therapeutics:  pain control in ED  A&P: Acute gout flare  - Indomethacin and colchicine  - Short course of pain medication  - PCP follow up   Patient seen by and discussed with Dr. Maryan Rued who agrees with treatment plan.   [facility] Tarynn Garling, PA-C 12/25/14 1729  Blanchie Dessert, MD 12/27/14 2145

## 2014-12-25 NOTE — ED Notes (Addendum)
Pt here for left hand  Swelling radiating to elbow. Denies injury.

## 2014-12-28 ENCOUNTER — Other Ambulatory Visit (HOSPITAL_COMMUNITY): Payer: Self-pay | Admitting: *Deleted

## 2014-12-29 ENCOUNTER — Inpatient Hospital Stay (HOSPITAL_COMMUNITY): Admission: RE | Admit: 2014-12-29 | Payer: Medicare Other | Source: Ambulatory Visit

## 2015-01-10 ENCOUNTER — Other Ambulatory Visit: Payer: Self-pay | Admitting: Podiatry

## 2015-01-11 ENCOUNTER — Encounter (HOSPITAL_COMMUNITY): Payer: Medicare Other

## 2015-01-18 ENCOUNTER — Encounter (HOSPITAL_COMMUNITY)
Admission: RE | Admit: 2015-01-18 | Discharge: 2015-01-18 | Disposition: A | Discharge status: Home / Self Care | Payer: Medicare Other | Source: Ambulatory Visit | Attending: Rheumatology | Admitting: Rheumatology

## 2015-01-18 DIAGNOSIS — M810 Age-related osteoporosis without current pathological fracture: Secondary | ICD-10-CM | POA: Insufficient documentation

## 2015-01-18 LAB — CK: Total CK: 1181 U/L — ABNORMAL HIGH (ref 38–234)

## 2015-01-18 MED ORDER — HEPARIN SOD (PORK) LOCK FLUSH 100 UNIT/ML IV SOLN
500.0000 [IU] | INTRAVENOUS | Status: DC
  Administered 2015-01-18: 500 [IU]

## 2015-01-18 MED ORDER — HEPARIN SOD (PORK) LOCK FLUSH 100 UNIT/ML IV SOLN
INTRAVENOUS | Status: AC
  Administered 2015-01-18: 500 [IU]
  Filled 2015-01-18: qty 5

## 2015-01-19 LAB — ALDOLASE: Aldolase: 8 U/L (ref 3.3–10.3)

## 2015-02-07 ENCOUNTER — Telehealth: Payer: Self-pay | Admitting: *Deleted

## 2015-02-07 NOTE — Telephone Encounter (Signed)
Patient complaining of LUQ abd pain. Wants to see Dr. Ardis Hughs. He has nothing until March.  Appt with Nicoletta Ba PA 02-20-2015 at 2:00 Pm /PP

## 2015-02-16 ENCOUNTER — Other Ambulatory Visit (HOSPITAL_COMMUNITY): Payer: Self-pay | Admitting: *Deleted

## 2015-02-19 ENCOUNTER — Encounter (HOSPITAL_COMMUNITY): Payer: Medicare Other

## 2015-02-20 ENCOUNTER — Ambulatory Visit: Payer: Medicare Other | Admitting: Physician Assistant

## 2015-03-08 ENCOUNTER — Encounter (HOSPITAL_COMMUNITY)
Admission: RE | Admit: 2015-03-08 | Discharge: 2015-03-08 | Disposition: A | Discharge status: Home / Self Care | Payer: Medicare Other | Source: Ambulatory Visit | Attending: Rheumatology | Admitting: Rheumatology

## 2015-03-08 DIAGNOSIS — M810 Age-related osteoporosis without current pathological fracture: Secondary | ICD-10-CM | POA: Insufficient documentation

## 2015-03-08 MED ORDER — HEPARIN SOD (PORK) LOCK FLUSH 100 UNIT/ML IV SOLN
500.0000 [IU] | INTRAVENOUS | Status: DC
  Administered 2015-03-08: 500 [IU]

## 2015-03-08 MED ORDER — HEPARIN SOD (PORK) LOCK FLUSH 100 UNIT/ML IV SOLN
INTRAVENOUS | Status: AC
  Filled 2015-03-08: qty 5

## 2015-04-06 ENCOUNTER — Encounter (HOSPITAL_COMMUNITY)
Admission: RE | Admit: 2015-04-06 | Discharge: 2015-04-06 | Disposition: A | Discharge status: Home / Self Care | Payer: Medicare Other | Source: Ambulatory Visit | Attending: Rheumatology | Admitting: Rheumatology

## 2015-04-06 DIAGNOSIS — M810 Age-related osteoporosis without current pathological fracture: Secondary | ICD-10-CM | POA: Diagnosis present

## 2015-04-06 LAB — CBC
HEMATOCRIT: 32.6 % — AB (ref 36.0–46.0)
Hemoglobin: 10.1 g/dL — ABNORMAL LOW (ref 12.0–15.0)
MCH: 25.2 pg — ABNORMAL LOW (ref 26.0–34.0)
MCHC: 31 g/dL (ref 30.0–36.0)
MCV: 81.3 fL (ref 78.0–100.0)
Platelets: 291 10*3/uL (ref 150–400)
RBC: 4.01 MIL/uL (ref 3.87–5.11)
RDW: 17.3 % — AB (ref 11.5–15.5)
WBC: 8.3 10*3/uL (ref 4.0–10.5)

## 2015-04-06 LAB — COMPREHENSIVE METABOLIC PANEL
ALT: 32 U/L (ref 14–54)
AST: 45 U/L — AB (ref 15–41)
Albumin: 3.7 g/dL (ref 3.5–5.0)
Alkaline Phosphatase: 51 U/L (ref 38–126)
Anion gap: 11 (ref 5–15)
BILIRUBIN TOTAL: 0.4 mg/dL (ref 0.3–1.2)
BUN: 40 mg/dL — AB (ref 6–20)
CO2: 22 mmol/L (ref 22–32)
Calcium: 9.4 mg/dL (ref 8.9–10.3)
Chloride: 105 mmol/L (ref 101–111)
Creatinine, Ser: 0.75 mg/dL (ref 0.44–1.00)
GFR calc Af Amer: >60 mL/min (ref >60)
Glucose, Bld: 99 mg/dL (ref 65–99)
POTASSIUM: 3.8 mmol/L (ref 3.5–5.1)
Sodium: 138 mmol/L (ref 135–145)
TOTAL PROTEIN: 6.5 g/dL (ref 6.5–8.1)

## 2015-04-06 LAB — CK: Total CK: 2157 U/L — ABNORMAL HIGH (ref 38–234)

## 2015-04-06 MED ORDER — HEPARIN SOD (PORK) LOCK FLUSH 100 UNIT/ML IV SOLN
INTRAVENOUS | Status: AC
  Filled 2015-04-06: qty 5

## 2015-04-06 MED ORDER — HEPARIN SOD (PORK) LOCK FLUSH 100 UNIT/ML IV SOLN
500.0000 [IU] | INTRAVENOUS | Status: DC
  Administered 2015-04-06: 500 [IU]

## 2015-04-07 DIAGNOSIS — M810 Age-related osteoporosis without current pathological fracture: Secondary | ICD-10-CM | POA: Diagnosis not present

## 2015-04-09 ENCOUNTER — Telehealth: Payer: Self-pay | Admitting: Gastroenterology

## 2015-04-09 NOTE — Telephone Encounter (Signed)
Left message on machine to call back  

## 2015-04-11 NOTE — Telephone Encounter (Signed)
Left message on machine to call back  

## 2015-04-12 NOTE — Telephone Encounter (Signed)
Left message on machine to call back will await further communication from the pt  

## 2015-04-30 ENCOUNTER — Encounter: Payer: Self-pay | Admitting: Gastroenterology

## 2015-04-30 ENCOUNTER — Ambulatory Visit (INDEPENDENT_AMBULATORY_CARE_PROVIDER_SITE_OTHER): Payer: Medicare Other | Admitting: Gastroenterology

## 2015-04-30 VITALS — BP 108/54 | HR 60

## 2015-04-30 DIAGNOSIS — R1030 Lower abdominal pain, unspecified: Secondary | ICD-10-CM | POA: Diagnosis not present

## 2015-04-30 NOTE — Progress Notes (Signed)
Review of pertinent gastrointestinal problems: 1. History of colonic adenomas. Colonoscopy January 2007. I removed three colon polyps, one was tubulovillous, 10 mm. Her next colonoscopy has been scheduled for January 2010. Repeat colonoscopy March, 2010 found more adenomas, recall colonoscopy was set at 3 year interval. Colonoscopy Dr. Ardis Hughs 02/2014 diverticulosis in left colon, othewrise normal: recall at 5 year interval recommended. 2. 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 This is seen by EGD January 2007, small, round. Attempt at a [place] was done two weeks later, but she did not tolerate the procedure very well, due to gagging and discomfort. Repeat EGD January, 2010 found lesion have not changed in size in 3 year interval, still less than 1 cm. Recommend clinical observation only 3. Dyspepsia, September 2012 evaluation: Plain films normal, CBC normal. I felt this was related to her eating habits and recommended 4-5 smaller meals a day rather than 2 large meals a day. EGD 02/2014 Dr. Ardis Hughs: gastric subepith lesion unchanged, mild gastritis biopsied; was H. Pylori negative on path. 4. 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 stark while hospitalized. He also found several small adenomatous polyps, removed them. She needs recall colonoscopy 3 years from now. She had also undergone EGD 11/12 one month prior for anemia, rectal bleeding. This was normal per Dr. Olevia Perches. Colonoscopy 05/2011 Dr. 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  Sitting is a wheelchair, smells of stale urine  Has lower abdominal pains.  These wake her up at night sometimes.    Carafate seems to help her lower abd  pains  She says she feels week with a poor appetite.  She has BM daily without issue.  Clothes fiiting looser.  She feels she's losing weight.   Past Medical History  Diagnosis Date  . Benign neoplasm of colon   . Esophageal reflux   . Polymyositis ([place])   . Allergic rhinitis, cause unspecified   . Unspecified essential hypertension   . Hyperlipidemia   . Osteoporosis   . GI hemorrhage 2011    recurrent  . 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 Vitamin D deficiency     Past Surgical History  Procedure Laterality Date  . Lumbar laminectomy    . Bilateral  rotator cuff surgery    . Esophagogastroduodenoscopy  12/08/2010    others also  . Abdominal hysterectomy      partial  . Colonoscopy  01/09/2011    others also  . Eus    . Colonoscopy  05/29/2011    Procedure: COLONOSCOPY;  Surgeon: Gatha Mayer, MD;  Location: [facility];  Service: Endoscopy;  Laterality: N/A;  Greggory Brandy Carlean Purl  . Hot hemostasis  05/29/2011    Procedure: HOT HEMOSTASIS (ARGON PLASMA COAGULATION/BICAP);  Surgeon: Gatha Mayer, MD;  Location: [facility];  Service: Endoscopy;  Laterality: N/A;  . Tonsillectomy  age 26  . Esophagogastroduodenoscopy (egd) with propofol N/A 02/16/2014    Procedure: ESOPHAGOGASTRODUODENOSCOPY (EGD) WITH PROPOFOL;  Surgeon: Milus Banister, MD;  Location: [facility];  Service: Endoscopy;  Laterality: N/A;  . Colonoscopy with propofol N/A 02/16/2014    Procedure: COLONOSCOPY WITH PROPOFOL;  Surgeon: Milus Banister, MD;  Location: [facility];  Service: Endoscopy;  Laterality: N/A;    Current Outpatient Prescriptions  Medication Sig Dispense Refill  . ALPRAZolam (XANAX) 0.25 MG tablet Take 0.25 mg by mouth 3 (three) times daily as needed. Anxiety and sleep    . Ascorbic Acid (VITAMIN C) 1000 MG tablet Take 1,000 mg by mouth daily.     Marland Kitchen atenolol (TENORMIN) 50 MG tablet Take 50 mg by mouth daily.      . Calcium Carbonate (CALTRATE 600) 1500 MG TABS Take 2 tablets by  mouth daily. Daily    . CARAFATE 1 GM/10ML suspension TAKE 10 MLS BY MOUTH FOUR TIMES DAILY (Patient not taking: Reported on 12/25/2014) 420 mL 0  . colchicine 0.6 MG tablet Take 1 tablet (0.6 mg total) by mouth daily. First dose, take two tablets (1.36m total). Then take one tablet (0.6 mg total) daily. 5 tablet 0  . ergocalciferol (VITAMIN D2) 50000 UNITS capsule Take 50,000 Units by mouth 2 (two) times a week. Takes 1 tablet every Monday and friday    . esomeprazole (NEXIUM) 40 MG capsule Take one capsule by thirty minutes before breakfast Take one capsule by thirty minutes before dinner (Patient taking differently: Take 40 mg by mouth daily. Take one capsule by thirty minutes before breakfast Take one capsule by thirty minutes before dinner) 60 capsule 3  . fluticasone (FLONASE) 50 MCG/ACT nasal spray Place 1 spray into both nostrils daily.    . folic acid (FOLVITE) 4169MCG tablet Take 400 mcg by mouth daily.      . furosemide (LASIX) 40 MG tablet Take 40 mg by mouth daily.      .Marland Kitchengabapentin (NEURONTIN) 100 MG capsule TAKE 1 CAPSULE(100 MG) BY MOUTH TWICE DAILY 60 capsule 11  . indomethacin (INDOCIN) 50 MG capsule Take 1 capsule (50 mg total) by mouth 3 (three) times daily with meals. 9 capsule 0  . loratadine (CLARITIN) 10 MG tablet Take 10 mg by mouth daily as needed. For allergies.    .Marland KitchenMOVIPREP 100 G SOLR Take 1 kit (200 g total) by mouth once. (Patient not taking: Reported on 11/29/2014) 1 kit 0  . ondansetron (ZOFRAN ODT) 8 MG disintegrating tablet Take 1 tablet (8 mg total) by mouth every 8 (eight) hours as needed for nausea or vomiting. (Patient not taking: Reported on 12/25/2014) 20 tablet 0  . oxyCODONE-acetaminophen (PERCOCET/ROXICET) 5-325 MG tablet Take 1 tablet by mouth every 6 (six) hours as needed for severe pain. 6 tablet 0  . potassium chloride SA (K-DUR,KLOR-CON) 20 MEQ tablet Take 40 mEq by mouth daily.     . predniSONE (DELTASONE) 10 MG tablet Take 5 mg by mouth daily.      . psyllium (HYDROCIL/METAMUCIL) 95 % PACK Take 1 packet by mouth daily. 56 each 6  . sodium chloride (OCEAN) 0.65 % nasal spray Place 1 spray into the nose as needed. For allergies.    .Marland Kitchensucralfate (CARAFATE) 1 GM/10ML suspension Take 1 g by mouth daily as needed. For ulcers    . vitamin A 8000 UNIT capsule Take 8,000 Units by mouth daily.    . vitamin B-12 (CYANOCOBALAMIN) 1000 MCG tablet Take 1,000 mcg by mouth daily.      . vitamin E 600 UNIT capsule Take 600 Units by mouth daily.       No current facility-administered medications for this visit.    Allergies as of 04/30/2015 - Review Complete 04/30/2015  Allergen Reaction Noted  . Codeine Other (See Comments)   . Hydrocodone  01/10/2013  Family History  Problem Relation Age of Onset  . Dementia Mother   . Hypertension Father   . Malignant hyperthermia Father   . Colon cancer Neg Hx     Social History   Social History  . Marital Status: Divorced    Spouse Name: N/A  . Number of Children: 5  . Years of Education: N/A   Occupational History  . retired     Social History Main Topics  . Smoking status: Never Smoker   . Smokeless tobacco: Current User    Types: Snuff     Comment: Tobacco info given 05/11/13  . Alcohol Use: No  . Drug Use: No  . Sexual Activity: No   Other Topics Concern  . Not on file   Social History Narrative     Physical Exam: BP 108/54 mmHg  Pulse 60  Ht   Wt  Constitutional: Obese, sitting in a wheelchair, smells of stale urine Psychiatric: alert and oriented x3 Abdomen: soft, nontender, nondistended, no obvious ascites, no peritoneal signs, normal bowel sounds   Assessment and plan: 75 y.o. female with chronic lower abdominal pains of unclear etiology.  I explained to her that I think there is nothing serious going on in her bowels since upper endoscopy and colonoscopy done just over a year ago were both essentially normal.  I recommended that we proceed with imaging testing to  begin her workup, CT scan abdomen and pelvis with IV and oral contrast.   Owens Loffler, MD [facility] 04/30/2015, 9:05 AM

## 2015-04-30 NOTE — Patient Instructions (Addendum)
You have been given a separate informational sheet regarding your tobacco use, the importance of quitting and local resources to help you quit. You will be set up for a CT scan of abdomen and pelvis with IV and oral contrast.  You have been scheduled for a CT scan of the abdomen and pelvis at [facility] ([address]---this is in the same building as Press photographer).   You are scheduled on 05/03/15 at 830 am. You should arrive 15 minutes prior to your appointment time for registration. Please follow the written instructions below on the day of your exam:  WARNING: IF YOU ARE ALLERGIC TO IODINE/X-RAY DYE, PLEASE NOTIFY RADIOLOGY IMMEDIATELY AT [phone]! YOU WILL BE GIVEN A 13 HOUR PREMEDICATION PREP.  1) Do not eat or drink anything after 430 am (4 hours prior to your test) 2) You have been given 2 bottles of oral contrast to drink. The solution may taste better if refrigerated, but do NOT add ice or any other liquid to this solution. Shake well before drinking.    Drink 1 bottle of contrast @ 630 am (2 hours prior to your exam)  Drink 1 bottle of contrast @ 730 am (1 hour prior to your exam)  You may take any medications as prescribed with a small amount of water except for the following: Metformin, Glucophage, Glucovance, Avandamet, Riomet, Fortamet, Actoplus Met, Janumet, Glumetza or Metaglip. The above medications must be held the day of the exam AND 48 hours after the exam.  The purpose of you drinking the oral contrast is to aid in the visualization of your intestinal tract. The contrast solution may cause some diarrhea. Before your exam is started, you will be given a small amount of fluid to drink. Depending on your individual set of symptoms, you may also receive an intravenous injection of x-ray contrast/dye. Plan on being at [facility] for 30 minutes or longer, depending on the type of exam you are having performed.  This test typically takes 30-45 minutes  to complete.  If you have any questions regarding your exam or if you need to reschedule, you may call the CT department at [phone] between the hours of 8:00 am and 5:00 pm, Monday-Friday.  ________________________________________________________________________  Since carafate seems to help your pains, stay on it. WE'll refill whenever needed.

## 2015-05-03 ENCOUNTER — Ambulatory Visit (INDEPENDENT_AMBULATORY_CARE_PROVIDER_SITE_OTHER)
Admission: RE | Admit: 2015-05-03 | Discharge: 2015-05-03 | Disposition: A | Discharge status: Home / Self Care | Payer: Medicare Other | Source: Ambulatory Visit | Attending: Gastroenterology | Admitting: Gastroenterology

## 2015-05-03 DIAGNOSIS — R1030 Lower abdominal pain, unspecified: Secondary | ICD-10-CM

## 2015-05-03 MED ORDER — IOPAMIDOL (ISOVUE-300) INJECTION 61%
100.0000 mL | Freq: Once | INTRAVENOUS | Status: AC | PRN
  Administered 2015-05-03: 100 mL via INTRAVENOUS

## 2015-05-04 ENCOUNTER — Encounter (HOSPITAL_COMMUNITY): Payer: Medicare Other

## 2015-05-09 ENCOUNTER — Other Ambulatory Visit (HOSPITAL_COMMUNITY): Payer: Self-pay | Admitting: *Deleted

## 2015-05-10 ENCOUNTER — Encounter (HOSPITAL_COMMUNITY): Payer: Medicare Other

## 2015-05-15 ENCOUNTER — Other Ambulatory Visit (HOSPITAL_COMMUNITY): Payer: Self-pay | Admitting: *Deleted

## 2015-05-16 ENCOUNTER — Encounter (HOSPITAL_COMMUNITY)
Admission: RE | Admit: 2015-05-16 | Discharge: 2015-05-16 | Disposition: A | Discharge status: Home / Self Care | Payer: Medicare Other | Source: Ambulatory Visit | Attending: Rheumatology | Admitting: Rheumatology

## 2015-05-16 DIAGNOSIS — M810 Age-related osteoporosis without current pathological fracture: Secondary | ICD-10-CM | POA: Insufficient documentation

## 2015-05-16 MED ORDER — HEPARIN SOD (PORK) LOCK FLUSH 100 UNIT/ML IV SOLN
INTRAVENOUS | Status: AC
  Administered 2015-05-16: 500 [IU]
  Filled 2015-05-16: qty 5

## 2015-06-19 ENCOUNTER — Other Ambulatory Visit (HOSPITAL_COMMUNITY): Payer: Self-pay | Admitting: *Deleted

## 2015-06-20 ENCOUNTER — Encounter (HOSPITAL_COMMUNITY)
Admission: RE | Admit: 2015-06-20 | Discharge: 2015-06-20 | Disposition: A | Discharge status: Home / Self Care | Payer: Medicare Other | Source: Ambulatory Visit | Attending: Rheumatology | Admitting: Rheumatology

## 2015-06-20 DIAGNOSIS — M810 Age-related osteoporosis without current pathological fracture: Secondary | ICD-10-CM | POA: Insufficient documentation

## 2015-06-20 LAB — COMPREHENSIVE METABOLIC PANEL
ALBUMIN: 3.7 g/dL (ref 3.5–5.0)
ALT: 26 U/L (ref 14–54)
ANION GAP: 12 (ref 5–15)
AST: 39 U/L (ref 15–41)
Alkaline Phosphatase: 91 U/L (ref 38–126)
BUN: 32 mg/dL — ABNORMAL HIGH (ref 6–20)
CHLORIDE: 106 mmol/L (ref 101–111)
CO2: 19 mmol/L — AB (ref 22–32)
CREATININE: 0.65 mg/dL (ref 0.44–1.00)
Calcium: 9.1 mg/dL (ref 8.9–10.3)
GFR calc non Af Amer: >60 mL/min (ref >60)
Glucose, Bld: 131 mg/dL — ABNORMAL HIGH (ref 65–99)
Potassium: 4.3 mmol/L (ref 3.5–5.1)
SODIUM: 137 mmol/L (ref 135–145)
Total Bilirubin: 1.2 mg/dL (ref 0.3–1.2)
Total Protein: 6.1 g/dL — ABNORMAL LOW (ref 6.5–8.1)

## 2015-06-20 LAB — CBC
HCT: 35.7 % — ABNORMAL LOW (ref 36.0–46.0)
Hemoglobin: 11.1 g/dL — ABNORMAL LOW (ref 12.0–15.0)
MCH: 24.6 pg — AB (ref 26.0–34.0)
MCHC: 31.1 g/dL (ref 30.0–36.0)
MCV: 79 fL (ref 78.0–100.0)
PLATELETS: 317 10*3/uL (ref 150–400)
RBC: 4.52 MIL/uL (ref 3.87–5.11)
RDW: 17.4 % — ABNORMAL HIGH (ref 11.5–15.5)
WBC: 9.4 10*3/uL (ref 4.0–10.5)

## 2015-06-20 LAB — CK: Total CK: 610 U/L — ABNORMAL HIGH (ref 38–234)

## 2015-06-20 MED ORDER — HEPARIN SOD (PORK) LOCK FLUSH 100 UNIT/ML IV SOLN
500.0000 [IU] | INTRAVENOUS | Status: AC | PRN
  Administered 2015-06-20: 500 [IU]

## 2015-06-20 MED ORDER — HEPARIN SOD (PORK) LOCK FLUSH 100 UNIT/ML IV SOLN
INTRAVENOUS | Status: AC
  Administered 2015-06-20: 500 [IU]
  Filled 2015-06-20: qty 5

## 2015-07-20 ENCOUNTER — Encounter (HOSPITAL_COMMUNITY)
Admission: RE | Admit: 2015-07-20 | Discharge: 2015-07-20 | Disposition: A | Discharge status: Home / Self Care | Payer: Medicare Other | Source: Ambulatory Visit | Attending: Rheumatology | Admitting: Rheumatology

## 2015-07-20 DIAGNOSIS — M810 Age-related osteoporosis without current pathological fracture: Secondary | ICD-10-CM | POA: Diagnosis not present

## 2015-07-20 MED ORDER — HEPARIN SOD (PORK) LOCK FLUSH 100 UNIT/ML IV SOLN
INTRAVENOUS | Status: AC
  Filled 2015-07-20: qty 5

## 2015-07-20 MED ORDER — HEPARIN SOD (PORK) LOCK FLUSH 100 UNIT/ML IV SOLN
500.0000 [IU] | INTRAVENOUS | Status: AC | PRN
Start: 2015-07-20 — End: 2015-07-20
  Administered 2015-07-20: 500 [IU]

## 2015-08-16 ENCOUNTER — Other Ambulatory Visit (HOSPITAL_COMMUNITY): Payer: Self-pay | Admitting: *Deleted

## 2015-08-17 ENCOUNTER — Encounter (HOSPITAL_COMMUNITY)
Admission: RE | Admit: 2015-08-17 | Discharge: 2015-08-17 | Disposition: A | Discharge status: Home / Self Care | Payer: Medicare Other | Source: Ambulatory Visit | Attending: Rheumatology | Admitting: Rheumatology

## 2015-08-17 DIAGNOSIS — M810 Age-related osteoporosis without current pathological fracture: Secondary | ICD-10-CM | POA: Diagnosis present

## 2015-08-17 MED ORDER — HEPARIN SOD (PORK) LOCK FLUSH 100 UNIT/ML IV SOLN
500.0000 [IU] | INTRAVENOUS | Status: AC | PRN
  Administered 2015-08-17: 500 [IU]

## 2015-08-17 MED ORDER — HEPARIN SOD (PORK) LOCK FLUSH 100 UNIT/ML IV SOLN
INTRAVENOUS | Status: AC
  Administered 2015-08-17: 500 [IU]
  Filled 2015-08-17: qty 5

## 2015-09-14 ENCOUNTER — Encounter (HOSPITAL_COMMUNITY): Payer: Medicare Other

## 2015-09-28 ENCOUNTER — Encounter (HOSPITAL_COMMUNITY): Payer: Medicare Other

## 2015-10-09 ENCOUNTER — Encounter (HOSPITAL_COMMUNITY)
Admission: RE | Admit: 2015-10-09 | Discharge: 2015-10-09 | Disposition: A | Discharge status: Home / Self Care | Payer: Medicare Other | Source: Ambulatory Visit | Attending: Rheumatology | Admitting: Rheumatology

## 2015-10-09 DIAGNOSIS — M810 Age-related osteoporosis without current pathological fracture: Secondary | ICD-10-CM | POA: Diagnosis present

## 2015-10-09 MED ORDER — HEPARIN SOD (PORK) LOCK FLUSH 100 UNIT/ML IV SOLN
500.0000 [IU] | Freq: Once | INTRAVENOUS | Status: AC
  Administered 2015-10-09: 500 [IU] via INTRAVENOUS

## 2015-10-09 MED ORDER — HEPARIN SOD (PORK) LOCK FLUSH 100 UNIT/ML IV SOLN
INTRAVENOUS | Status: AC
  Filled 2015-10-09: qty 5

## 2015-10-12 ENCOUNTER — Encounter (HOSPITAL_COMMUNITY): Payer: Medicare Other

## 2015-11-07 ENCOUNTER — Other Ambulatory Visit (HOSPITAL_COMMUNITY): Payer: Self-pay | Admitting: *Deleted

## 2015-11-08 ENCOUNTER — Encounter (HOSPITAL_COMMUNITY)
Admission: RE | Admit: 2015-11-08 | Discharge: 2015-11-08 | Disposition: A | Discharge status: Home / Self Care | Payer: Medicare Other | Source: Ambulatory Visit | Attending: Rheumatology | Admitting: Rheumatology

## 2015-11-08 DIAGNOSIS — M810 Age-related osteoporosis without current pathological fracture: Secondary | ICD-10-CM | POA: Insufficient documentation

## 2015-11-08 MED ORDER — HEPARIN SOD (PORK) LOCK FLUSH 100 UNIT/ML IV SOLN
INTRAVENOUS | Status: AC
  Administered 2015-11-08: 500 [IU]
  Filled 2015-11-08: qty 5

## 2015-11-08 MED ORDER — HEPARIN SOD (PORK) LOCK FLUSH 100 UNIT/ML IV SOLN
500.0000 [IU] | Freq: Once | INTRAVENOUS | Status: DC
Start: 2015-11-08 — End: 2015-11-09

## 2015-12-10 ENCOUNTER — Inpatient Hospital Stay (HOSPITAL_COMMUNITY): Admission: RE | Admit: 2015-12-10 | Payer: Medicare Other | Source: Ambulatory Visit

## 2015-12-17 ENCOUNTER — Encounter (HOSPITAL_COMMUNITY)
Admission: RE | Admit: 2015-12-17 | Discharge: 2015-12-17 | Disposition: A | Discharge status: Home / Self Care | Payer: Medicare Other | Source: Ambulatory Visit | Attending: Rheumatology | Admitting: Rheumatology

## 2015-12-17 DIAGNOSIS — M810 Age-related osteoporosis without current pathological fracture: Secondary | ICD-10-CM | POA: Insufficient documentation

## 2015-12-17 MED ORDER — HEPARIN SOD (PORK) LOCK FLUSH 100 UNIT/ML IV SOLN
INTRAVENOUS | Status: AC
  Administered 2015-12-17: 500 [IU] via INTRAVENOUS
  Filled 2015-12-17: qty 5

## 2015-12-17 MED ORDER — HEPARIN SOD (PORK) LOCK FLUSH 100 UNIT/ML IV SOLN
500.0000 [IU] | Freq: Once | INTRAVENOUS | Status: AC
  Administered 2015-12-17: 500 [IU] via INTRAVENOUS

## 2016-01-18 ENCOUNTER — Other Ambulatory Visit (HOSPITAL_COMMUNITY): Payer: Self-pay | Admitting: *Deleted

## 2016-01-21 ENCOUNTER — Encounter (HOSPITAL_COMMUNITY): Payer: Medicare Other

## 2016-01-30 ENCOUNTER — Other Ambulatory Visit (HOSPITAL_COMMUNITY): Payer: Self-pay | Admitting: *Deleted

## 2016-02-01 ENCOUNTER — Inpatient Hospital Stay (HOSPITAL_COMMUNITY): Admission: RE | Admit: 2016-02-01 | Payer: Medicare Other | Source: Ambulatory Visit

## 2016-02-08 ENCOUNTER — Encounter (HOSPITAL_COMMUNITY)
Admission: RE | Admit: 2016-02-08 | Discharge: 2016-02-08 | Disposition: A | Discharge status: Home / Self Care | Payer: Medicare Other | Source: Ambulatory Visit | Attending: Rheumatology | Admitting: Rheumatology

## 2016-02-08 DIAGNOSIS — M810 Age-related osteoporosis without current pathological fracture: Secondary | ICD-10-CM | POA: Diagnosis present

## 2016-02-08 MED ORDER — HEPARIN SOD (PORK) LOCK FLUSH 100 UNIT/ML IV SOLN
500.0000 [IU] | INTRAVENOUS | Status: DC | PRN
  Administered 2016-02-08: 500 [IU]

## 2016-02-08 MED ORDER — HEPARIN SOD (PORK) LOCK FLUSH 100 UNIT/ML IV SOLN
INTRAVENOUS | Status: AC
  Administered 2016-02-08: 500 [IU]
  Filled 2016-02-08: qty 5

## 2016-03-07 ENCOUNTER — Encounter (HOSPITAL_COMMUNITY): Payer: Medicare Other

## 2016-03-11 ENCOUNTER — Other Ambulatory Visit: Payer: Self-pay | Admitting: Internal Medicine

## 2016-03-11 DIAGNOSIS — Z1231 Encounter for screening mammogram for malignant neoplasm of breast: Secondary | ICD-10-CM

## 2016-03-14 ENCOUNTER — Encounter (HOSPITAL_COMMUNITY)
Admission: RE | Admit: 2016-03-14 | Discharge: 2016-03-14 | Disposition: A | Discharge status: Home / Self Care | Payer: Medicare Other | Source: Ambulatory Visit | Attending: Rheumatology | Admitting: Rheumatology

## 2016-03-14 DIAGNOSIS — M810 Age-related osteoporosis without current pathological fracture: Secondary | ICD-10-CM | POA: Insufficient documentation

## 2016-03-14 MED ORDER — HEPARIN SOD (PORK) LOCK FLUSH 100 UNIT/ML IV SOLN
INTRAVENOUS | Status: AC
  Administered 2016-03-14: 500 [IU]
  Filled 2016-03-14: qty 5

## 2016-03-14 MED ORDER — HEPARIN SOD (PORK) LOCK FLUSH 100 UNIT/ML IV SOLN
500.0000 [IU] | INTRAVENOUS | Status: DC | PRN
  Administered 2016-03-14: 500 [IU]

## 2016-03-31 ENCOUNTER — Ambulatory Visit
Admission: RE | Admit: 2016-03-31 | Discharge: 2016-03-31 | Disposition: A | Discharge status: Home / Self Care | Payer: Medicare Other | Source: Ambulatory Visit | Attending: Internal Medicine | Admitting: Internal Medicine

## 2016-03-31 DIAGNOSIS — Z1231 Encounter for screening mammogram for malignant neoplasm of breast: Secondary | ICD-10-CM

## 2016-04-14 ENCOUNTER — Encounter (HOSPITAL_COMMUNITY): Payer: Medicare Other

## 2016-04-17 ENCOUNTER — Telehealth: Payer: Self-pay | Admitting: *Deleted

## 2016-04-17 MED ORDER — GABAPENTIN 100 MG PO CAPS: 100.0000 mg | ORAL_CAPSULE | Freq: Two times a day (BID) | ORAL | 0 refills | Status: DC

## 2016-04-17 NOTE — Telephone Encounter (Signed)
Received refill request for Gabapentin 100mg . Dr. Paulla Dolly states pt has not been seen in office since 2016, needs an appt,refill once to get pt to appt date. Return fax rx refill once.

## 2016-04-18 ENCOUNTER — Encounter (HOSPITAL_COMMUNITY)
Admission: RE | Admit: 2016-04-18 | Discharge: 2016-04-18 | Disposition: A | Discharge status: Home / Self Care | Payer: Medicare Other | Source: Ambulatory Visit | Attending: Rheumatology | Admitting: Rheumatology

## 2016-04-18 DIAGNOSIS — M810 Age-related osteoporosis without current pathological fracture: Secondary | ICD-10-CM | POA: Insufficient documentation

## 2016-04-18 MED ORDER — HEPARIN SOD (PORK) LOCK FLUSH 100 UNIT/ML IV SOLN
500.0000 [IU] | INTRAVENOUS | Status: AC | PRN
  Administered 2016-04-18: 500 [IU]

## 2016-04-18 MED ORDER — HEPARIN SOD (PORK) LOCK FLUSH 100 UNIT/ML IV SOLN
INTRAVENOUS | Status: AC
  Administered 2016-04-18: 11:00:00 500 [IU]
  Filled 2016-04-18: qty 5

## 2016-04-28 DIAGNOSIS — M5416 Radiculopathy, lumbar region: Secondary | ICD-10-CM | POA: Diagnosis not present

## 2016-04-28 DIAGNOSIS — R600 Localized edema: Secondary | ICD-10-CM | POA: Diagnosis not present

## 2016-04-28 DIAGNOSIS — G609 Hereditary and idiopathic neuropathy, unspecified: Secondary | ICD-10-CM | POA: Diagnosis not present

## 2016-04-28 DIAGNOSIS — M859 Disorder of bone density and structure, unspecified: Secondary | ICD-10-CM | POA: Diagnosis not present

## 2016-04-28 DIAGNOSIS — M129 Arthropathy, unspecified: Secondary | ICD-10-CM | POA: Diagnosis not present

## 2016-04-28 DIAGNOSIS — M3320 Polymyositis, organ involvement unspecified: Secondary | ICD-10-CM | POA: Diagnosis not present

## 2016-04-28 DIAGNOSIS — R531 Weakness: Secondary | ICD-10-CM | POA: Diagnosis not present

## 2016-04-28 DIAGNOSIS — M1000 Idiopathic gout, unspecified site: Secondary | ICD-10-CM | POA: Diagnosis not present

## 2016-04-28 DIAGNOSIS — I10 Essential (primary) hypertension: Secondary | ICD-10-CM | POA: Diagnosis not present

## 2016-05-07 ENCOUNTER — Ambulatory Visit: Payer: Medicare HMO | Attending: *Deleted

## 2016-05-07 DIAGNOSIS — M6281 Muscle weakness (generalized): Secondary | ICD-10-CM | POA: Insufficient documentation

## 2016-05-07 DIAGNOSIS — R2689 Other abnormalities of gait and mobility: Secondary | ICD-10-CM | POA: Insufficient documentation
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
## 2016-05-12 DIAGNOSIS — M19011 Primary osteoarthritis, right shoulder: Secondary | ICD-10-CM | POA: Diagnosis not present

## 2016-05-12 DIAGNOSIS — M19012 Primary osteoarthritis, left shoulder: Secondary | ICD-10-CM | POA: Diagnosis not present

## 2016-05-15 ENCOUNTER — Ambulatory Visit: Payer: Medicare HMO | Admitting: Physical Therapy

## 2016-05-16 ENCOUNTER — Encounter (HOSPITAL_COMMUNITY): Payer: Medicare Other

## 2016-05-16 DIAGNOSIS — M899 Disorder of bone, unspecified: Secondary | ICD-10-CM | POA: Diagnosis not present

## 2016-05-16 DIAGNOSIS — M3320 Polymyositis, organ involvement unspecified: Secondary | ICD-10-CM | POA: Diagnosis not present

## 2016-05-16 DIAGNOSIS — I10 Essential (primary) hypertension: Secondary | ICD-10-CM | POA: Diagnosis not present

## 2016-05-19 ENCOUNTER — Ambulatory Visit: Payer: Medicare HMO | Admitting: Physical Therapy

## 2016-05-19 ENCOUNTER — Encounter (HOSPITAL_COMMUNITY): Payer: Self-pay | Admitting: Emergency Medicine

## 2016-05-19 ENCOUNTER — Encounter (HOSPITAL_COMMUNITY)
Admission: RE | Admit: 2016-05-19 | Discharge: 2016-05-19 | Disposition: A | Discharge status: Home / Self Care | Payer: Medicare HMO | Source: Ambulatory Visit | Attending: Rheumatology | Admitting: Rheumatology

## 2016-05-19 ENCOUNTER — Emergency Department (HOSPITAL_COMMUNITY)
Admission: EM | Admit: 2016-05-19 | Discharge: 2016-05-19 | Disposition: A | Discharge status: Home / Self Care | Payer: Commercial Managed Care - HMO | Attending: Dermatology | Admitting: Dermatology

## 2016-05-19 DIAGNOSIS — M810 Age-related osteoporosis without current pathological fracture: Secondary | ICD-10-CM | POA: Diagnosis not present

## 2016-05-19 DIAGNOSIS — Z5321 Procedure and treatment not carried out due to patient leaving prior to being seen by health care provider: Secondary | ICD-10-CM | POA: Insufficient documentation

## 2016-05-19 DIAGNOSIS — M25511 Pain in right shoulder: Secondary | ICD-10-CM | POA: Diagnosis not present

## 2016-05-19 MED ORDER — HEPARIN SOD (PORK) LOCK FLUSH 100 UNIT/ML IV SOLN: 500.0000 [IU] | INTRAVENOUS | Status: DC | PRN

## 2016-05-19 MED ORDER — HEPARIN SOD (PORK) LOCK FLUSH 100 UNIT/ML IV SOLN
INTRAVENOUS | Status: AC
  Administered 2016-05-19: 11:00:00 500 [IU]
  Filled 2016-05-19: qty 5

## 2016-05-19 NOTE — ED Notes (Signed)
Spoke with [name] MD verbalizes orders will pt placed by provider when room available. Pt standing order need placed at present time.

## 2016-05-19 NOTE — ED Triage Notes (Addendum)
Pt complaint of worsening right shoulder pain and weakness ongoing for 3 weeks; pt verbalizes was told by PCP to be reevaluated if pain and weakness persisted. Pt recently received steroid injections for pain; still unable to lift right arm for past month. Pt hx of chronic bilateral shoulder pain.

## 2016-05-19 NOTE — ED Notes (Signed)
Pt gave labels to registration stating they were leaving and did not wish to be seen by a physician.

## 2016-05-21 ENCOUNTER — Institutional Professional Consult (permissible substitution): Payer: Medicare Other | Admitting: Neurology

## 2016-05-21 DIAGNOSIS — M549 Dorsalgia, unspecified: Secondary | ICD-10-CM | POA: Diagnosis not present

## 2016-05-21 DIAGNOSIS — M3320 Polymyositis, organ involvement unspecified: Secondary | ICD-10-CM | POA: Diagnosis not present

## 2016-05-21 DIAGNOSIS — M25532 Pain in left wrist: Secondary | ICD-10-CM | POA: Diagnosis not present

## 2016-05-22 ENCOUNTER — Institutional Professional Consult (permissible substitution): Payer: Medicare Other | Admitting: Neurology

## 2016-05-22 ENCOUNTER — Telehealth: Payer: Self-pay

## 2016-05-22 NOTE — Telephone Encounter (Signed)
Patient did not show to new sleep consult appt.  

## 2016-05-23 ENCOUNTER — Ambulatory Visit: Payer: Medicare HMO

## 2016-05-26 ENCOUNTER — Ambulatory Visit: Payer: Medicare HMO | Admitting: Rehabilitation

## 2016-05-27 DIAGNOSIS — M810 Age-related osteoporosis without current pathological fracture: Secondary | ICD-10-CM | POA: Diagnosis not present

## 2016-05-27 NOTE — Therapy (Signed)
[facility] [address] [place], [place], [address] Phone: [phone]   Fax:  [phone]  Patient Details  Name: [name] [name] MRN: [id number] Date of Birth: 23-Aug-1940 Referring Provider:  No ref. provider found  Encounter Date: 2016/06/16  PHYSICAL THERAPY DISCHARGE SUMMARY  Visits from Start of Care: 1  Current functional level related to goals / functional outcomes:     PT Long Term Goals - 05/07/16 1534      PT LONG TERM GOAL #1   Title Pt will perform HEP at MOD I level, with some assist from family, to improve deficits listed above. TARGET DATE FOR ALL LTGS: 06/04/16   Status New     PT LONG TERM GOAL #2   Title Pt will perform w/c<>mat txfs with mod A to improve functional mobility.   Status New     PT LONG TERM GOAL #3   Title Pt will reach 10" outside BOS in seated to perform ADLs.    Status New     PT LONG TERM GOAL #4   Title Attempt standing frame and amb. as indicated to improve mobility and bone density.    Status New     PT LONG TERM GOAL #5   Title Pt will perform balance activities (weight shifting and reaching outside BOS) for 10 minutes to perform ADLs safely.   Status New        Remaining deficits: Unknown, as pt never returned since initial eval due to reporting she "had some shots" and didn't feel up to PT at this time.   Education / Equipment: POC, frequency, duration of PT  Plan: Patient agrees to discharge.  Patient goals were not met. Patient is being discharged due to not returning since the last visit.  ?????           G-Codes - 2016/06/16 0911    Functional Assessment Tool Used (Outpatient Only) Txfs: total A   Functional Limitation Changing and maintaining body position   Changing and Maintaining Body Position Current Status (W8934) At least 80 percent but less than 100 percent impaired, limited or restricted   Changing and Maintaining Body Position Goal Status  (M6840) At least 60 percent but less than 80 percent impaired, limited or restricted   Changing and Maintaining Body Position Discharge Status (T3533) At least 80 percent but less than 100 percent impaired, limited or restricted      Felishia Wartman L 06-16-16, 9:11 AM  [facility] [address] [place], [place], [address] Phone: [phone]   Fax:  [phone]   Geoffry Paradise, PT,DPT 06/16/16 9:11 AM Phone: [phone] Fax: [phone]

## 2016-05-28 ENCOUNTER — Encounter: Payer: Self-pay | Admitting: Neurology

## 2016-05-30 ENCOUNTER — Ambulatory Visit: Payer: Commercial Managed Care - HMO

## 2016-06-02 ENCOUNTER — Ambulatory Visit: Payer: Commercial Managed Care - HMO | Admitting: Physical Therapy

## 2016-06-06 ENCOUNTER — Ambulatory Visit: Payer: Commercial Managed Care - HMO | Admitting: Physical Therapy

## 2016-06-10 DIAGNOSIS — H524 Presbyopia: Secondary | ICD-10-CM | POA: Diagnosis not present

## 2016-06-10 DIAGNOSIS — H25813 Combined forms of age-related cataract, bilateral: Secondary | ICD-10-CM | POA: Diagnosis not present

## 2016-06-10 DIAGNOSIS — H35363 Drusen (degenerative) of macula, bilateral: Secondary | ICD-10-CM | POA: Diagnosis not present

## 2016-06-10 DIAGNOSIS — H04123 Dry eye syndrome of bilateral lacrimal glands: Secondary | ICD-10-CM | POA: Diagnosis not present

## 2016-06-11 ENCOUNTER — Ambulatory Visit: Payer: Commercial Managed Care - HMO

## 2016-06-13 ENCOUNTER — Ambulatory Visit: Payer: Commercial Managed Care - HMO

## 2016-06-20 ENCOUNTER — Other Ambulatory Visit (HOSPITAL_COMMUNITY): Payer: Self-pay | Admitting: *Deleted

## 2016-06-20 ENCOUNTER — Ambulatory Visit: Payer: Medicare Other | Admitting: Podiatry

## 2016-06-23 ENCOUNTER — Encounter (HOSPITAL_COMMUNITY)
Admission: RE | Admit: 2016-06-23 | Discharge: 2016-06-23 | Disposition: A | Discharge status: Home / Self Care | Payer: Medicare Other | Source: Ambulatory Visit | Attending: Rheumatology | Admitting: Rheumatology

## 2016-06-23 DIAGNOSIS — M810 Age-related osteoporosis without current pathological fracture: Secondary | ICD-10-CM | POA: Insufficient documentation

## 2016-06-23 MED ORDER — HEPARIN SOD (PORK) LOCK FLUSH 100 UNIT/ML IV SOLN
INTRAVENOUS | Status: AC
  Administered 2016-06-23: 500 [IU]
  Filled 2016-06-23: qty 5

## 2016-06-23 MED ORDER — HEPARIN SOD (PORK) LOCK FLUSH 100 UNIT/ML IV SOLN
500.0000 [IU] | INTRAVENOUS | Status: DC | PRN
  Administered 2016-06-23: 500 [IU]

## 2016-06-24 ENCOUNTER — Other Ambulatory Visit: Payer: Self-pay | Admitting: Podiatry

## 2016-06-24 NOTE — Telephone Encounter (Signed)
Pt needs an appt

## 2016-07-11 ENCOUNTER — Ambulatory Visit (INDEPENDENT_AMBULATORY_CARE_PROVIDER_SITE_OTHER): Payer: Medicare Other | Admitting: Podiatry

## 2016-07-11 ENCOUNTER — Ambulatory Visit (INDEPENDENT_AMBULATORY_CARE_PROVIDER_SITE_OTHER): Payer: Medicare Other

## 2016-07-11 ENCOUNTER — Encounter: Payer: Self-pay | Admitting: Podiatry

## 2016-07-11 DIAGNOSIS — M79605 Pain in left leg: Secondary | ICD-10-CM

## 2016-07-11 DIAGNOSIS — M79604 Pain in right leg: Secondary | ICD-10-CM

## 2016-07-11 DIAGNOSIS — B351 Tinea unguium: Secondary | ICD-10-CM | POA: Diagnosis not present

## 2016-07-11 DIAGNOSIS — R609 Edema, unspecified: Secondary | ICD-10-CM

## 2016-07-11 NOTE — Progress Notes (Signed)
Subjective:    Patient ID: [name] [name], female   DOB: 76 y.o.   MRN: [id number]   HPI patient presents stating that her feet are swelling in her nails need to be cut and they're incurvated. She is in a wheelchair    ROS      Objective:  Physical Exam neurovascular status unchanged with patient noted to have mild edema in the dorsum of both feet and into the ankles with negative Homans sign. Patient is nonweightbearing which could be contributory and was found to have elongated incurvated painful nailbeds 1-5 both feet     Assessment:   Unknown cause of nondescript swelling bilateral with nail disease 1-5 both feet with pain      Plan:    H&P condition reviewed and at this point nail debridement accomplished. Patient will see and call her internist today to be seen as I'm concerned about the swelling and whether there is any systemic causes and I cannot render treatment without her seen her internist. She is going to make an appointment with him today

## 2016-07-21 ENCOUNTER — Encounter (HOSPITAL_COMMUNITY): Payer: Commercial Managed Care - HMO

## 2016-07-21 ENCOUNTER — Encounter: Payer: Self-pay | Admitting: Neurology

## 2016-07-23 ENCOUNTER — Ambulatory Visit (INDEPENDENT_AMBULATORY_CARE_PROVIDER_SITE_OTHER): Payer: Medicare Other | Admitting: Neurology

## 2016-07-23 ENCOUNTER — Encounter: Payer: Self-pay | Admitting: Neurology

## 2016-07-23 VITALS — BP 131/80 | HR 64

## 2016-07-23 DIAGNOSIS — E273 Drug-induced adrenocortical insufficiency: Secondary | ICD-10-CM

## 2016-07-23 DIAGNOSIS — M3322 Polymyositis with myopathy: Secondary | ICD-10-CM | POA: Diagnosis not present

## 2016-07-23 DIAGNOSIS — T380X5A Adverse effect of glucocorticoids and synthetic analogues, initial encounter: Secondary | ICD-10-CM

## 2016-07-23 DIAGNOSIS — M6258 Muscle wasting and atrophy, not elsewhere classified, other site: Secondary | ICD-10-CM | POA: Diagnosis not present

## 2016-07-23 DIAGNOSIS — G4719 Other hypersomnia: Secondary | ICD-10-CM

## 2016-07-23 NOTE — Progress Notes (Signed)
SLEEP MEDICINE CLINIC   Provider:  Larey Seat, M D  Primary Care Physician:  [name], MD   Referring Provider: [name], MD   Chief Complaint  Patient presents with  . Excessive Daytime Sleepiness    Reports feeling tired all the time despite the number of hours she sleeps.  She takes frequent naps during the day.    HPI:  [name] [name] is a 76 y.o. female , seen here as in a referral from Dr. Brigitte Pulse for a sleep consultation originally to Dr Rexene Alberts-   [name] [name] is a 76 year old right-handed African-American lady with a history of polymyositis, that is followed by orthopedist Dr. French Ana and by rheumatologist Dr. Amil Amen. This referral was initiated by her primary care physician and internist Dr. Lutricia Feil. The patient is reportedly excessively daytime sleepy, responds well to prednisone but experienced worsening once the dose has to be decreased again. She has also experienced muscle shrinkage, atrophy,  and higher blood pressures on steroids. She reports never having had diabetes from steroids. The prednisone is provided by Dr. Amil Amen. She presents in a wheelchair.   Chief complaint according to patient : " I am falling asleep any time, when ever I am quiet"  Sleep habits are as follows: The patient reports a usual bedtime at 9 PM, and by that time she feels so fatigued and sleepy that she hardly finds the energy to get her nightgown on. She does not report insomnia she falls asleep rather promptly, and she will sleep through until 5 AM. This has been her usual time to wake up and rise. She does not report any nocturia, no diaphoresis or palpitations. In spite of 7-8 hours of nocturnal sleep she feels not refreshed or restored, she remains achy, weak and fatigued. When she has a medical appointment (" on average once a week'),she arranges for [employer] bus to transfer her.   Sleep medical history and family sleep history: She does have a Port-A-Cath in place.she has a  condition of polymyositis which is painful, leaves her week and certainly contributes to fatigue. She has been diagnosed with osteopenia, likely related to prednisone, she has been on right proximal and which has been discontinued she had been on CellCept. She remains on 2 antihypertensives, atenolol and Lasix. She takes fish oil 4 capsules daily, Dr. Raul Del records list and impaired fasting glucose but her HbA1c has been low. She has gout, adrenal insufficiency, peripheral neuropathy, she is immune compromised and excessively daytime sleepy.  Social history: retired as a Automotive engineer, Training and development officer. Manager at [employer]  lives with her son, no tobacco use history , No ETOH use, Caffeine - only tea or coffee 1-2 AM, none in PM.   Review of Systems: Out of a complete 14 system review, the patient complains of only the following symptoms, and all other reviewed systems are negative.  No snoring, no palpitations, no diaphoresis, wheelchair.  Uses wheelchair in her own home, cannot transfer without assistance.  Epworth score 12 , Fatigue severity score 57 (!)  , geriatric depression score 04/15    Social History   Social History  . Marital status: Divorced    Spouse name: N/A  . Number of children: 5  . Years of education: 11th grade   Occupational History  . retired     Social History Main Topics  . Smoking status: Never Smoker  . Smokeless tobacco: Former Systems developer    Types: Snuff     Comment: Tobacco  info given 05/11/13  . Alcohol use No  . Drug use: No  . Sexual activity: No   Other Topics Concern  . Not on file   Social History Narrative   Lives at home with her son.   Right-handed.   1 cup caffeine per day.    Family History  Problem Relation Age of Onset  . Dementia Mother   . Hypertension Father   . Malignant hyperthermia Father   . Colon cancer Neg Hx     Past Medical History:  Diagnosis Date  . Allergic rhinitis, cause unspecified   . Benign neoplasm of  colon   . Esophageal reflux   . Excessive daytime sleepiness   . 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 GI hemorrhage 2011   recurrent  . Hyperlipidemia   . Osteoporosis   . Polymyositis ([place])   . Unspecified essential hypertension   . Vitamin D deficiency     Past Surgical History:  Procedure Laterality Date  . ABDOMINAL HYSTERECTOMY     partial  . bilateral  rotator cuff surgery    . COLONOSCOPY  01/09/2011   others also  . COLONOSCOPY  05/29/2011   Procedure: COLONOSCOPY;  Surgeon: Gatha Mayer, MD;  Location: [facility];  Service: Endoscopy;  Laterality: N/A;  Greggory Brandy Carlean Purl  . COLONOSCOPY WITH PROPOFOL N/A 02/16/2014   Procedure: COLONOSCOPY WITH PROPOFOL;  Surgeon: Milus Banister, MD;  Location: [facility];  Service: Endoscopy;  Laterality: N/A;  . ESOPHAGOGASTRODUODENOSCOPY  12/08/2010   others also  . ESOPHAGOGASTRODUODENOSCOPY (EGD) WITH PROPOFOL N/A 02/16/2014   Procedure: ESOPHAGOGASTRODUODENOSCOPY (EGD) WITH PROPOFOL;  Surgeon: Milus Banister, MD;  Location: [facility];  Service: Endoscopy;  Laterality: N/A;  . EUS    . HOT HEMOSTASIS  05/29/2011   Procedure: HOT HEMOSTASIS (ARGON PLASMA COAGULATION/BICAP);  Surgeon: Gatha Mayer, MD;  Location: [facility];  Service: Endoscopy;  Laterality: N/A;  . LUMBAR LAMINECTOMY    . TONSILLECTOMY  age 16    Current Outpatient Prescriptions  Medication Sig Dispense Refill  . ALPRAZolam (XANAX) 0.25 MG tablet Take 0.25 mg by mouth 3 (three) times daily as needed. Anxiety and sleep    . Ascorbic Acid (VITAMIN C) 1000 MG tablet Take 1,000 mg by mouth daily.     Marland Kitchen atenolol (TENORMIN) 50 MG tablet Take 50 mg by mouth daily.      . Calcium Carbonate (CALTRATE 600) 1500 MG TABS Take 2 tablets by mouth daily. Daily    . Cholecalciferol (VITAMIN D PO) Take 1,200 mg by mouth daily.    Marland Kitchen dicyclomine (BENTYL) 10 MG capsule Take 10 mg by mouth daily.    Marland Kitchen esomeprazole (NEXIUM) 40 MG capsule Take one capsule by thirty minutes  before breakfast Take one capsule by thirty minutes before dinner (Patient taking differently: Take 40 mg by mouth daily. Take one capsule by thirty minutes before breakfast Take one capsule by thirty minutes before dinner) 60 capsule 3  . fluticasone (FLONASE) 50 MCG/ACT nasal spray Place 1 spray into both nostrils daily.    . folic acid (FOLVITE) 818 MCG tablet Take 400 mcg by mouth daily.      . furosemide (LASIX) 40 MG tablet Take 40 mg by mouth daily.      Marland Kitchen gabapentin (NEURONTIN) 100 MG capsule TAKE 1 CAPSULE BY MOUTH TWICE DAILY 30 capsule 0  . loratadine (CLARITIN) 10 MG tablet Take 10 mg by mouth daily as needed. For allergies.    Marland Kitchen  mycophenolate (CELLCEPT) 500 MG tablet TK 2 TS PO BID  1  . Omega-3 Fatty Acids (OMEGA 3 PO) Take by mouth daily.    Marland Kitchen oxyCODONE-acetaminophen (PERCOCET/ROXICET) 5-325 MG tablet Take 1 tablet by mouth every 6 (six) hours as needed for severe pain. 6 tablet 0  . potassium chloride SA (K-DUR,KLOR-CON) 20 MEQ tablet Take 40 mEq by mouth daily.     . predniSONE (DELTASONE) 10 MG tablet Take 10 mg by mouth daily.     . psyllium (HYDROCIL/METAMUCIL) 95 % PACK Take 1 packet by mouth daily. 56 each 6  . sodium chloride (OCEAN) 0.65 % nasal spray Place 1 spray into the nose as needed. For allergies.    Marland Kitchen sucralfate (CARAFATE) 1 GM/10ML suspension Take 1 g by mouth daily as needed. For ulcers    . ULORIC 40 MG tablet Take 40 mg by mouth daily.    . vitamin A 8000 UNIT capsule Take 8,000 Units by mouth daily.    . vitamin B-12 (CYANOCOBALAMIN) 1000 MCG tablet Take 1,000 mcg by mouth daily.      . vitamin E 600 UNIT capsule Take 600 Units by mouth daily.       No current facility-administered medications for this visit.     Allergies as of 07/23/2016 - Review Complete 07/23/2016  Allergen Reaction Noted  . Codeine Other (See Comments)   . Hydrocodone  01/10/2013    Vitals: BP 131/80   Pulse 64  Last Weight:  Wt Readings from Last 1 Encounters:  06/23/16  170 lb (77.1 kg)   VCB:SWHQP is no height or weight on file to calculate BMI.     Last Height:   Ht Readings from Last 1 Encounters:  06/23/16 5\' 3"  (1.6 m)    Physical exam:  General: The patient is awake, alert and appears not in acute distress. The patient is well groomed. Head: Normocephalic, atraumatic. Neck is supple. Mallampati 5 , macroglossia.   neck circumference:15.5  Nasal airflow patent ,  Retrognathia is mild.  Cardiovascular:  Regular rate and rhythm , without  murmurs or carotid bruit, and without distended neck veins. Respiratory: Lungs are clear to auscultation. Skin:  Without evidence of edema, or rash Trunk:  The patient's posture is stooped, she has trouble lifting her chin.  Neurologic exam : The patient is awake and alert, oriented to place and time.   Memory subjective  described as intact.  Attention span & concentration ability appears normal.  Speech is fluent,  with dysphonia, not  aphasia.  Mood and affect are appropriate.  Cranial nerves: Pupils are equal and briskly reactive to light.  Extraocular movements  in vertical and horizontal planes intact and without nystagmus. Visual fields by finger perimetry are intact. Hearing to finger rub intact.   Facial sensation intact to fine touch.  Facial motor strength is symmetric , she keeps her mouth closed, and  tongue and uvula move midline. Shoulder shrug was symmetrical- but weak .   Motor exam:  Normal tone, but reduced muscle bulk- distal and proximal weakness / symmetric in all extremities. Sensory:  Fine touch, pinprick and vibration were tested in all extremities. Proprioception tested in the upper extremities was normal. Coordination: Rapid alternating movements in the fingers/hands were slowed, there is also evidence of significant hand muscle atrophy Gait and station: deferred Deep tendon reflexes: in the upper and lower extremities are absent - not elicited.     Assessment:  After physical and  neurologic examination, review of  laboratory studies,  Personal review of imaging studies, reports of other /same  Imaging studies, results of polysomnography and / or neurophysiology testing and pre-existing records as far as provided in visit., my assessment is   1) Mrs. Heitzenrater's excessive daytime sleepiness was demonstrated here during the visit, she has fallen asleep twice within the first 15 minutes. By she endorsed the Epworth Sleepiness Scale at 12 points I would estimate that her Epworth score is probably closer to 20. She does not appear depressed and endorsed the geometric depression score accordingly low. She is clearly very much affected by her rheumatological and autoimmune disorder. The treatment with steroids also has taken a toll on her. Autoimmune disorders do cause fatigue and sleepiness.   2)Mrs. Lepkowski has also been treated with benzodiazepines, narcotic pain medication for back pain, and is on multiple other prescription medications at this time. Some of these medications can promote obstructive sleep apnea, others like narcotic medications, can promote central sleep apnea.  3)She is unaware that she snores has not been told that she snores, but it is possible that polymyositis has also affected the intercostal musculature and may cause hypoventilation at night.  Due to her very limited mobility it will be difficult to evaluate her outside her home and I have therefore asked for a home sleep test for her. This patient will not be able to pick the device up at our office and return it- It would be best to delegate this to a DME.  I don't suspect that she may have hypoventilation for the reasons named above. She has mild dysphonia, but denies dysphagia.   The patient was advised of the nature of the diagnosed disorder - possible hypoventilation in progressed/ disabling POLYMYOSITIS- , the treatment options and the  risks for general health and wellness arising from not treating the  condition. I will limit my involvement to the sleep medicine work up.  I would defer to my 4 colleagues with neuromuscular training for neuromuscular questions. The patient questioned if she needs ST, OT and PT - I will defer this to her rheumatologist and internist.   I spent more than 50 minutes of face to face time with the patient.  Greater than 50% of time was spent in counseling and coordination of care. We have discussed the diagnosis and differential and I answered the patient's questions.    Plan:  Treatment plan and additional workup :  cannot undergo PSG in regular lab due to limited mobility- will order HST . Follow up with me or NP after test is read.     Larey Seat, MD 0/93/2355, 73:22 AM  Certified in Neurology by ABPN Certified in [place] by [facility] [address] [place], [place] [address]

## 2016-08-01 ENCOUNTER — Other Ambulatory Visit (HOSPITAL_COMMUNITY): Payer: Self-pay | Admitting: *Deleted

## 2016-08-04 ENCOUNTER — Encounter (HOSPITAL_COMMUNITY)
Admission: RE | Admit: 2016-08-04 | Discharge: 2016-08-04 | Disposition: A | Discharge status: Home / Self Care | Payer: Medicare Other | Source: Ambulatory Visit | Attending: Rheumatology | Admitting: Rheumatology

## 2016-08-04 DIAGNOSIS — M810 Age-related osteoporosis without current pathological fracture: Secondary | ICD-10-CM | POA: Diagnosis not present

## 2016-08-04 MED ORDER — HEPARIN SOD (PORK) LOCK FLUSH 100 UNIT/ML IV SOLN
INTRAVENOUS | Status: AC
  Filled 2016-08-04: qty 5

## 2016-08-04 MED ORDER — HEPARIN SOD (PORK) LOCK FLUSH 100 UNIT/ML IV SOLN
500.0000 [IU] | INTRAVENOUS | Status: AC | PRN
  Administered 2016-08-04: 11:00:00 500 [IU]

## 2016-08-13 ENCOUNTER — Other Ambulatory Visit: Payer: Self-pay | Admitting: Podiatry

## 2016-08-14 ENCOUNTER — Telehealth: Payer: Self-pay | Admitting: Neurology

## 2016-08-14 NOTE — Telephone Encounter (Signed)
We have attempted to call the patient 3 times to schedule sleep study. Patient has been unavailable at the phone numbers we have on file and has not returned our calls. At this point we will send a letter asking pt to please contact the sleep lab to schedule their sleep study. If patient calls back we will schedule them for their sleep study. ° °

## 2016-08-29 ENCOUNTER — Other Ambulatory Visit (HOSPITAL_COMMUNITY): Payer: Self-pay | Admitting: *Deleted

## 2016-09-01 ENCOUNTER — Encounter (HOSPITAL_COMMUNITY)
Admission: RE | Admit: 2016-09-01 | Discharge: 2016-09-01 | Disposition: A | Discharge status: Home / Self Care | Payer: Medicare Other | Source: Ambulatory Visit | Attending: Rheumatology | Admitting: Rheumatology

## 2016-09-01 DIAGNOSIS — M810 Age-related osteoporosis without current pathological fracture: Secondary | ICD-10-CM | POA: Insufficient documentation

## 2016-09-01 MED ORDER — HEPARIN SOD (PORK) LOCK FLUSH 100 UNIT/ML IV SOLN
INTRAVENOUS | Status: AC
  Administered 2016-09-01: 11:00:00 500 [IU]
  Filled 2016-09-01: qty 5

## 2016-09-01 MED ORDER — HEPARIN SOD (PORK) LOCK FLUSH 100 UNIT/ML IV SOLN: 500.0000 [IU] | INTRAVENOUS | Status: DC | PRN

## 2016-09-11 ENCOUNTER — Other Ambulatory Visit: Payer: Self-pay | Admitting: Podiatry

## 2016-09-11 NOTE — Telephone Encounter (Signed)
Pt needs an appt prior to future refills. 

## 2016-09-18 ENCOUNTER — Other Ambulatory Visit (HOSPITAL_COMMUNITY): Payer: Self-pay | Admitting: *Deleted

## 2016-09-19 ENCOUNTER — Encounter (HOSPITAL_COMMUNITY)
Admission: RE | Admit: 2016-09-19 | Discharge: 2016-09-19 | Disposition: A | Discharge status: Home / Self Care | Payer: Medicare Other | Source: Ambulatory Visit | Attending: Rheumatology | Admitting: Rheumatology

## 2016-09-19 DIAGNOSIS — M810 Age-related osteoporosis without current pathological fracture: Secondary | ICD-10-CM | POA: Diagnosis not present

## 2016-09-19 LAB — COMPREHENSIVE METABOLIC PANEL
ALBUMIN: 3.4 g/dL — AB (ref 3.5–5.0)
ALK PHOS: 54 U/L (ref 38–126)
ALT: 17 U/L (ref 14–54)
ANION GAP: 12 (ref 5–15)
AST: 34 U/L (ref 15–41)
BILIRUBIN TOTAL: 0.7 mg/dL (ref 0.3–1.2)
BUN: 20 mg/dL (ref 6–20)
CALCIUM: 9 mg/dL (ref 8.9–10.3)
CO2: 22 mmol/L (ref 22–32)
CREATININE: 0.51 mg/dL (ref 0.44–1.00)
Chloride: 107 mmol/L (ref 101–111)
GFR calc non Af Amer: >60 mL/min (ref >60)
GLUCOSE: 90 mg/dL (ref 65–99)
Potassium: 4 mmol/L (ref 3.5–5.1)
Sodium: 141 mmol/L (ref 135–145)
TOTAL PROTEIN: 5.8 g/dL — AB (ref 6.5–8.1)

## 2016-09-19 LAB — CBC WITH DIFFERENTIAL/PLATELET
Basophils Absolute: 0 10*3/uL (ref 0.0–0.1)
Basophils Relative: 0 %
Eosinophils Absolute: 0.1 10*3/uL (ref 0.0–0.7)
Eosinophils Relative: 1 %
HEMATOCRIT: 31.7 % — AB (ref 36.0–46.0)
HEMOGLOBIN: 9.7 g/dL — AB (ref 12.0–15.0)
LYMPHS ABS: 1.1 10*3/uL (ref 0.7–4.0)
Lymphocytes Relative: 14 %
MCH: 25.9 pg — AB (ref 26.0–34.0)
MCHC: 30.6 g/dL (ref 30.0–36.0)
MCV: 84.8 fL (ref 78.0–100.0)
MONOS PCT: 7 %
Monocytes Absolute: 0.6 10*3/uL (ref 0.1–1.0)
NEUTROS ABS: 6.3 10*3/uL (ref 1.7–7.7)
NEUTROS PCT: 78 %
Platelets: 291 10*3/uL (ref 150–400)
RBC: 3.74 MIL/uL — ABNORMAL LOW (ref 3.87–5.11)
RDW: 17.5 % — ABNORMAL HIGH (ref 11.5–15.5)
WBC: 8.1 10*3/uL (ref 4.0–10.5)

## 2016-09-19 MED ORDER — HEPARIN SOD (PORK) LOCK FLUSH 100 UNIT/ML IV SOLN
INTRAVENOUS | Status: AC
  Filled 2016-09-19: qty 5

## 2016-09-19 MED ORDER — HEPARIN SOD (PORK) LOCK FLUSH 100 UNIT/ML IV SOLN
500.0000 [IU] | INTRAVENOUS | Status: AC | PRN
  Administered 2016-09-19: 500 [IU]

## 2016-10-11 ENCOUNTER — Other Ambulatory Visit: Payer: Self-pay | Admitting: Podiatry

## 2016-10-16 ENCOUNTER — Other Ambulatory Visit: Payer: Self-pay | Admitting: Podiatry

## 2016-10-17 ENCOUNTER — Other Ambulatory Visit (HOSPITAL_COMMUNITY): Payer: Self-pay | Admitting: *Deleted

## 2016-10-17 NOTE — Telephone Encounter (Signed)
Pt needs an appt prior to future refill, last seen 2016. [name] [name]

## 2016-10-20 ENCOUNTER — Encounter (HOSPITAL_COMMUNITY)
Admission: RE | Admit: 2016-10-20 | Discharge: 2016-10-20 | Disposition: A | Discharge status: Home / Self Care | Payer: Medicare Other | Source: Ambulatory Visit | Attending: Rheumatology | Admitting: Rheumatology

## 2016-10-20 DIAGNOSIS — M810 Age-related osteoporosis without current pathological fracture: Secondary | ICD-10-CM | POA: Diagnosis not present

## 2016-10-20 MED ORDER — HEPARIN SOD (PORK) LOCK FLUSH 100 UNIT/ML IV SOLN
INTRAVENOUS | Status: AC
  Filled 2016-10-20: qty 5

## 2016-10-20 MED ORDER — HEPARIN SOD (PORK) LOCK FLUSH 100 UNIT/ML IV SOLN
500.0000 [IU] | INTRAVENOUS | Status: DC | PRN
  Administered 2016-10-20: 11:00:00 500 [IU]

## 2016-10-22 ENCOUNTER — Encounter (HOSPITAL_COMMUNITY): Payer: Self-pay | Admitting: Emergency Medicine

## 2016-10-22 ENCOUNTER — Emergency Department (HOSPITAL_COMMUNITY): Payer: Medicare Other

## 2016-10-22 ENCOUNTER — Observation Stay (HOSPITAL_COMMUNITY)
Admission: EM | Admit: 2016-10-22 | Discharge: 2016-10-24 | Disposition: A | Discharge status: Home / Self Care | Payer: Medicare Other | Attending: Internal Medicine | Admitting: Internal Medicine

## 2016-10-22 DIAGNOSIS — M3320 Polymyositis, organ involvement unspecified: Secondary | ICD-10-CM | POA: Diagnosis present

## 2016-10-22 DIAGNOSIS — Z7952 Long term (current) use of systemic steroids: Secondary | ICD-10-CM | POA: Diagnosis not present

## 2016-10-22 DIAGNOSIS — R5381 Other malaise: Secondary | ICD-10-CM | POA: Diagnosis not present

## 2016-10-22 DIAGNOSIS — I5032 Chronic diastolic (congestive) heart failure: Secondary | ICD-10-CM | POA: Diagnosis present

## 2016-10-22 DIAGNOSIS — Z79899 Other long term (current) drug therapy: Secondary | ICD-10-CM | POA: Diagnosis not present

## 2016-10-22 DIAGNOSIS — I10 Essential (primary) hypertension: Secondary | ICD-10-CM | POA: Diagnosis not present

## 2016-10-22 DIAGNOSIS — D649 Anemia, unspecified: Secondary | ICD-10-CM | POA: Diagnosis not present

## 2016-10-22 DIAGNOSIS — T68XXXA Hypothermia, initial encounter: Principal | ICD-10-CM | POA: Diagnosis present

## 2016-10-22 DIAGNOSIS — E2740 Unspecified adrenocortical insufficiency: Secondary | ICD-10-CM | POA: Diagnosis present

## 2016-10-22 DIAGNOSIS — R51 Headache: Secondary | ICD-10-CM | POA: Insufficient documentation

## 2016-10-22 DIAGNOSIS — I110 Hypertensive heart disease with heart failure: Secondary | ICD-10-CM | POA: Diagnosis not present

## 2016-10-22 DIAGNOSIS — R531 Weakness: Secondary | ICD-10-CM

## 2016-10-22 LAB — CBC WITH DIFFERENTIAL/PLATELET
BASOS ABS: 0 10*3/uL (ref 0.0–0.1)
Basophils Relative: 0 %
Eosinophils Absolute: 0.1 10*3/uL (ref 0.0–0.7)
Eosinophils Relative: 1 %
HEMATOCRIT: 35 % — AB (ref 36.0–46.0)
HEMOGLOBIN: 11.1 g/dL — AB (ref 12.0–15.0)
LYMPHS PCT: 25 %
Lymphs Abs: 1.2 10*3/uL (ref 0.7–4.0)
MCH: 26.4 pg (ref 26.0–34.0)
MCHC: 31.7 g/dL (ref 30.0–36.0)
MCV: 83.1 fL (ref 78.0–100.0)
Monocytes Absolute: 0.2 10*3/uL (ref 0.1–1.0)
Monocytes Relative: 4 %
NEUTROS ABS: 3.3 10*3/uL (ref 1.7–7.7)
Neutrophils Relative %: 70 %
Platelets: 384 10*3/uL (ref 150–400)
RBC: 4.21 MIL/uL (ref 3.87–5.11)
RDW: 16.8 % — ABNORMAL HIGH (ref 11.5–15.5)
WBC: 4.8 10*3/uL (ref 4.0–10.5)

## 2016-10-22 LAB — D-DIMER, QUANTITATIVE: D-Dimer, Quant: 1.77 ug/mL-FEU — ABNORMAL HIGH (ref 0.00–0.50)

## 2016-10-22 LAB — COMPREHENSIVE METABOLIC PANEL
ALBUMIN: 3.7 g/dL (ref 3.5–5.0)
ALT: 28 U/L (ref 14–54)
ANION GAP: 13 (ref 5–15)
AST: 31 U/L (ref 15–41)
Alkaline Phosphatase: 50 U/L (ref 38–126)
BILIRUBIN TOTAL: 0.5 mg/dL (ref 0.3–1.2)
BUN: 24 mg/dL — ABNORMAL HIGH (ref 6–20)
CHLORIDE: 103 mmol/L (ref 101–111)
CO2: 23 mmol/L (ref 22–32)
Calcium: 9.5 mg/dL (ref 8.9–10.3)
Creatinine, Ser: 0.58 mg/dL (ref 0.44–1.00)
GFR calc Af Amer: >60 mL/min (ref >60)
Glucose, Bld: 109 mg/dL — ABNORMAL HIGH (ref 65–99)
Potassium: 3.8 mmol/L (ref 3.5–5.1)
Sodium: 139 mmol/L (ref 135–145)
TOTAL PROTEIN: 6.5 g/dL (ref 6.5–8.1)

## 2016-10-22 LAB — FIBRINOGEN: Fibrinogen: 401 mg/dL (ref 210–475)

## 2016-10-22 LAB — URINALYSIS, ROUTINE W REFLEX MICROSCOPIC
BACTERIA UA: NONE SEEN
BILIRUBIN URINE: NEGATIVE
Glucose, UA: NEGATIVE mg/dL
HGB URINE DIPSTICK: NEGATIVE
Ketones, ur: NEGATIVE mg/dL
Nitrite: NEGATIVE
Protein, ur: NEGATIVE mg/dL
SPECIFIC GRAVITY, URINE: 1.017 (ref 1.005–1.030)
pH: 5 (ref 5.0–8.0)

## 2016-10-22 LAB — TROPONIN I

## 2016-10-22 LAB — TSH: TSH: 1.08 u[IU]/mL (ref 0.350–4.500)

## 2016-10-22 LAB — CK: Total CK: 892 U/L — ABNORMAL HIGH (ref 38–234)

## 2016-10-22 LAB — I-STAT CG4 LACTIC ACID, ED: LACTIC ACID, VENOUS: 1.09 mmol/L (ref 0.5–1.9)

## 2016-10-22 MED ORDER — ACETAMINOPHEN 325 MG PO TABS: 650.0000 mg | ORAL_TABLET | Freq: Four times a day (QID) | ORAL | Status: DC | PRN

## 2016-10-22 MED ORDER — ONDANSETRON HCL 4 MG PO TABS: 4.0000 mg | ORAL_TABLET | Freq: Four times a day (QID) | ORAL | Status: DC | PRN

## 2016-10-22 MED ORDER — METOCLOPRAMIDE HCL 5 MG/ML IJ SOLN
10.0000 mg | Freq: Once | INTRAMUSCULAR | Status: AC
  Administered 2016-10-22: 10 mg via INTRAVENOUS
  Filled 2016-10-22: qty 2

## 2016-10-22 MED ORDER — SENNOSIDES-DOCUSATE SODIUM 8.6-50 MG PO TABS: 1.0000 | ORAL_TABLET | Freq: Every evening | ORAL | Status: DC | PRN

## 2016-10-22 MED ORDER — PANTOPRAZOLE SODIUM 40 MG PO TBEC
40.0000 mg | DELAYED_RELEASE_TABLET | Freq: Every day | ORAL | Status: DC
  Administered 2016-10-23 – 2016-10-24 (×2): 40 mg via ORAL
  Filled 2016-10-22 (×2): qty 1

## 2016-10-22 MED ORDER — FOLIC ACID 1 MG PO TABS
1000.0000 ug | ORAL_TABLET | Freq: Every day | ORAL | Status: DC
  Administered 2016-10-23 – 2016-10-24 (×3): 1 mg via ORAL
  Filled 2016-10-22 (×2): qty 1

## 2016-10-22 MED ORDER — SODIUM CHLORIDE 0.9 % IV BOLUS (SEPSIS)
500.0000 mL | Freq: Once | INTRAVENOUS | Status: AC
  Administered 2016-10-22: 500 mL via INTRAVENOUS

## 2016-10-22 MED ORDER — ENSURE ENLIVE PO LIQD
237.0000 mL | Freq: Two times a day (BID) | ORAL | Status: DC
  Administered 2016-10-23 – 2016-10-24 (×4): 237 mL via ORAL

## 2016-10-22 MED ORDER — ALPRAZOLAM 0.25 MG PO TABS: 0.2500 mg | ORAL_TABLET | Freq: Three times a day (TID) | ORAL | Status: DC | PRN

## 2016-10-22 MED ORDER — BISACODYL 5 MG PO TBEC: 5.0000 mg | DELAYED_RELEASE_TABLET | Freq: Every day | ORAL | Status: DC | PRN

## 2016-10-22 MED ORDER — OMEGA-3-ACID ETHYL ESTERS 1 G PO CAPS
2.0000 g | ORAL_CAPSULE | Freq: Every day | ORAL | Status: DC
  Administered 2016-10-23 – 2016-10-24 (×2): 2 g via ORAL
  Filled 2016-10-22 (×2): qty 2

## 2016-10-22 MED ORDER — HYDROCODONE-ACETAMINOPHEN 5-325 MG PO TABS
1.0000 | ORAL_TABLET | ORAL | Status: DC | PRN
  Administered 2016-10-23: 2 via ORAL
  Filled 2016-10-22: qty 2

## 2016-10-22 MED ORDER — VITAMIN D3 25 MCG (1000 UNIT) PO TABS
1000.0000 [IU] | ORAL_TABLET | Freq: Every day | ORAL | Status: DC
  Administered 2016-10-23 – 2016-10-24 (×3): 1000 [IU] via ORAL
  Filled 2016-10-22 (×2): qty 1

## 2016-10-22 MED ORDER — LISINOPRIL 20 MG PO TABS
20.0000 mg | ORAL_TABLET | Freq: Every day | ORAL | Status: DC
  Filled 2016-10-22 (×2): qty 1

## 2016-10-22 MED ORDER — GABAPENTIN 100 MG PO CAPS
100.0000 mg | ORAL_CAPSULE | Freq: Two times a day (BID) | ORAL | Status: DC
  Administered 2016-10-23 – 2016-10-24 (×3): 100 mg via ORAL
  Filled 2016-10-22 (×3): qty 1

## 2016-10-22 MED ORDER — SALINE SPRAY 0.65 % NA SOLN
1.0000 | NASAL | Status: DC | PRN
  Filled 2016-10-22: qty 44

## 2016-10-22 MED ORDER — SODIUM CHLORIDE 0.9% FLUSH: 3.0000 mL | INTRAVENOUS | Status: DC | PRN

## 2016-10-22 MED ORDER — ACETAMINOPHEN 500 MG PO TABS
1000.0000 mg | ORAL_TABLET | Freq: Once | ORAL | Status: AC
  Administered 2016-10-22: 1000 mg via ORAL
  Filled 2016-10-22: qty 2

## 2016-10-22 MED ORDER — HALOPERIDOL LACTATE 5 MG/ML IJ SOLN: 2.0000 mg | Freq: Once | INTRAMUSCULAR | Status: DC

## 2016-10-22 MED ORDER — ALLOPURINOL 300 MG PO TABS
300.0000 mg | ORAL_TABLET | Freq: Every day | ORAL | Status: DC
  Administered 2016-10-23 – 2016-10-24 (×2): 300 mg via ORAL
  Filled 2016-10-22 (×2): qty 1

## 2016-10-22 MED ORDER — SODIUM CHLORIDE 0.9 % IV SOLN: 250.0000 mL | INTRAVENOUS | Status: DC | PRN

## 2016-10-22 MED ORDER — HEPARIN SOD (PORK) LOCK FLUSH 100 UNIT/ML IV SOLN
500.0000 [IU] | INTRAVENOUS | Status: AC | PRN
  Administered 2016-10-24: 500 [IU]

## 2016-10-22 MED ORDER — ACETAMINOPHEN 650 MG RE SUPP: 650.0000 mg | Freq: Four times a day (QID) | RECTAL | Status: DC | PRN

## 2016-10-22 MED ORDER — MYCOPHENOLATE MOFETIL 250 MG PO CAPS
1000.0000 mg | ORAL_CAPSULE | Freq: Two times a day (BID) | ORAL | Status: DC
  Administered 2016-10-23 – 2016-10-24 (×3): 1000 mg via ORAL
  Filled 2016-10-22 (×5): qty 4

## 2016-10-22 MED ORDER — PREDNISONE 5 MG PO TABS
5.0000 mg | ORAL_TABLET | Freq: Every day | ORAL | Status: DC
  Administered 2016-10-23 – 2016-10-24 (×2): 5 mg via ORAL
  Filled 2016-10-22 (×2): qty 1

## 2016-10-22 MED ORDER — FLUTICASONE PROPIONATE 50 MCG/ACT NA SUSP: 1.0000 | Freq: Every day | NASAL | Status: DC

## 2016-10-22 MED ORDER — DICYCLOMINE HCL 10 MG PO CAPS
10.0000 mg | ORAL_CAPSULE | Freq: Every day | ORAL | Status: DC
  Administered 2016-10-23 – 2016-10-24 (×2): 10 mg via ORAL
  Filled 2016-10-22 (×2): qty 1

## 2016-10-22 MED ORDER — ONDANSETRON HCL 4 MG/2ML IJ SOLN: 4.0000 mg | Freq: Four times a day (QID) | INTRAMUSCULAR | Status: DC | PRN

## 2016-10-22 MED ORDER — LORATADINE 10 MG PO TABS: 10.0000 mg | ORAL_TABLET | Freq: Every day | ORAL | Status: DC | PRN

## 2016-10-22 MED ORDER — IOPAMIDOL (ISOVUE-370) INJECTION 76%
INTRAVENOUS | Status: AC
  Administered 2016-10-22: 55 mL
  Filled 2016-10-22: qty 100

## 2016-10-22 MED ORDER — FLUTICASONE PROPIONATE 50 MCG/ACT NA SUSP
1.0000 | Freq: Every day | NASAL | Status: DC
  Filled 2016-10-22: qty 16

## 2016-10-22 MED ORDER — BUDESONIDE 0.25 MG/2ML IN SUSP: 0.2500 mg | Freq: Two times a day (BID) | RESPIRATORY_TRACT | Status: DC

## 2016-10-22 MED ORDER — ATENOLOL 50 MG PO TABS
50.0000 mg | ORAL_TABLET | Freq: Every day | ORAL | Status: DC
  Administered 2016-10-23 – 2016-10-24 (×2): 50 mg via ORAL
  Filled 2016-10-22 (×2): qty 1

## 2016-10-22 MED ORDER — SODIUM CHLORIDE 0.9% FLUSH
3.0000 mL | Freq: Two times a day (BID) | INTRAVENOUS | Status: DC
  Administered 2016-10-22 – 2016-10-23 (×2): 3 mL via INTRAVENOUS

## 2016-10-22 MED ORDER — VITAMIN B-12 1000 MCG PO TABS
1000.0000 ug | ORAL_TABLET | Freq: Every day | ORAL | Status: DC
  Administered 2016-10-23 – 2016-10-24 (×2): 1000 ug via ORAL
  Filled 2016-10-22 (×2): qty 1

## 2016-10-22 MED ORDER — ENOXAPARIN SODIUM 40 MG/0.4ML ~~LOC~~ SOLN
40.0000 mg | Freq: Every day | SUBCUTANEOUS | Status: DC
  Administered 2016-10-23: 40 mg via SUBCUTANEOUS
  Filled 2016-10-22 (×2): qty 0.4

## 2016-10-22 MED ORDER — SODIUM CHLORIDE 0.9% FLUSH
3.0000 mL | Freq: Two times a day (BID) | INTRAVENOUS | Status: DC
  Administered 2016-10-22: 3 mL via INTRAVENOUS

## 2016-10-22 NOTE — ED Notes (Signed)
Pt's rectal temp=97.8

## 2016-10-22 NOTE — ED Triage Notes (Signed)
Pt c/o generalized weakness and headache x 1 week. Pt states her symptoms started after receiving a flu shot on 10/15/2016. CBG by ems: 115. Pt states she tried Tylenol for pain, no relief.

## 2016-10-22 NOTE — ED Notes (Signed)
Unable to obtain a temperature at this time on pt.  3 attempts were made with 2 different thermometers.

## 2016-10-22 NOTE — H&P (Signed)
History and Physical    PEARSON REASONS NAT:[id number] DOB: Apr 11, 1940 DOA: 10/22/2016  PCP: [name], MD   Patient coming from: Home  Chief Complaint: Headache, malaise   HPI: [name] [name] is a 76 y.o. female with medical history significant for hypertension, chronic diastolic CHF, polymyositis, and excessive daytime sleepiness, now presenting to the emergency department for evaluation of headache and malaise. Patient reports that she maintain her usual state of health until approximately one week ago when she noted the insidious development of mild headache and a general malaise. Symptoms have persisted largely unchanged for the past week, now prompting her to come into the ED. She denies any change in vision or hearing and denies any focal numbness or weakness. There has not been any chest pain, shortness of breath, or palpitations associated with this. No recent fall or trauma. Reports that she had a recent changes made to her gout medications, but otherwise no changes. Denies any alcohol use.  ED Course: Upon arrival to the ED, patient is found to be hypothermic to 34.1 C, saturating well on room air, and with vitals otherwise stable. EKG features a sinus rhythm with PACs and nonspecific ST abnormality. Chest x-ray is notable for right basilar atelectasis. Chemistry panel features and elevated BUN to creatinine ratio. CBC is unremarkable. Lactic acid is within the normal limits, troponin is undetectable, urinalysis is unremarkable, and d-dimer is elevated to 1.77. CTA chest is negative for PE, but notable for right lower lobe atelectasis. Noncontrast head CT is notable for advanced chronic ischemic disease, but no acute intracranial abnormality. Patient was treated with Reglan, acetaminophen, and 500 mL normal saline in the ED. Headache improved with this, but she remained hypothermic and was placed on warming blankets. She continues to be hemodynamically stable and in no apparent  respiratory distress, and will be observed on the telemetry unit for ongoing evaluation and management of hypothermia.  Review of Systems:  All other systems reviewed and apart from HPI, are negative.  Past Medical History:  Diagnosis Date  . Allergic rhinitis, cause unspecified   . Benign neoplasm of colon   . Esophageal reflux   . Excessive daytime sleepiness   . 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 GI hemorrhage 2011   recurrent  . Hyperlipidemia   . Osteoporosis   . Polymyositis ([place])   . Unspecified essential hypertension   . Vitamin D deficiency     Past Surgical History:  Procedure Laterality Date  . ABDOMINAL HYSTERECTOMY     partial  . bilateral  rotator cuff surgery    . COLONOSCOPY  01/09/2011   others also  . COLONOSCOPY  05/29/2011   Procedure: COLONOSCOPY;  Surgeon: Gatha Mayer, MD;  Location: [facility];  Service: Endoscopy;  Laterality: N/A;  Greggory Brandy Carlean Purl  . COLONOSCOPY WITH PROPOFOL N/A 02/16/2014   Procedure: COLONOSCOPY WITH PROPOFOL;  Surgeon: Milus Banister, MD;  Location: [facility];  Service: Endoscopy;  Laterality: N/A;  . ESOPHAGOGASTRODUODENOSCOPY  12/08/2010   others also  . ESOPHAGOGASTRODUODENOSCOPY (EGD) WITH PROPOFOL N/A 02/16/2014   Procedure: ESOPHAGOGASTRODUODENOSCOPY (EGD) WITH PROPOFOL;  Surgeon: Milus Banister, MD;  Location: [facility];  Service: Endoscopy;  Laterality: N/A;  . EUS    . HOT HEMOSTASIS  05/29/2011   Procedure: HOT HEMOSTASIS (ARGON PLASMA COAGULATION/BICAP);  Surgeon: Gatha Mayer, MD;  Location: [facility];  Service: Endoscopy;  Laterality: N/A;  . LUMBAR LAMINECTOMY    . TONSILLECTOMY  age 88  reports that she has never smoked. She has quit using smokeless tobacco. Her smokeless tobacco use included Snuff. She reports that she does not drink alcohol or use drugs.  Allergies  Allergen Reactions  . Codeine Other (See Comments)    Feels drunk  . Hydrocodone     "makes me sleepy"    Family  History  Problem Relation Age of Onset  . Dementia Mother   . Hypertension Father   . Malignant hyperthermia Father   . Colon cancer Neg Hx      Prior to Admission medications   Medication Sig Start Date End Date Taking? Authorizing Provider  acetaminophen (TYLENOL) 500 MG tablet Take 1,000 mg by mouth daily as needed.   Yes [provider]  allopurinol (ZYLOPRIM) 300 MG tablet Take 300 mg by mouth daily. 10/11/16  Yes [provider]  ALPRAZolam (XANAX) 0.25 MG tablet Take 0.25 mg by mouth 3 (three) times daily as needed. Anxiety and sleep   Yes [provider]  Ascorbic Acid (VITAMIN C) 1000 MG tablet Take 1,000 mg by mouth daily.    Yes [provider]  atenolol (TENORMIN) 50 MG tablet Take 50 mg by mouth daily.     Yes [provider]  Calcium Carbonate (CALTRATE 600) 1500 MG TABS Take 2 tablets by mouth daily. Daily   Yes [provider]  Cholecalciferol (VITAMIN D PO) Take 1,200 mg by mouth daily.   Yes [provider]  colchicine 0.6 MG tablet Take 0.6 mg by mouth. Take 1 tablet by mouth every 8 hours as needed for gout flare 10/13/16  Yes [provider]  dicyclomine (BENTYL) 10 MG capsule Take 10 mg by mouth daily. 07/22/16  Yes [provider]  esomeprazole (NEXIUM) 40 MG capsule Take one capsule by thirty minutes before breakfast Take one capsule by thirty minutes before dinner Patient taking differently: Take 40 mg by mouth daily. Take one capsule by thirty minutes before breakfast Take one capsule by thirty minutes before dinner 05/11/13  Yes Zehr, Janett Billow D, PA-C  fluticasone (FLONASE) 50 MCG/ACT nasal spray Place 1 spray into both nostrils daily. 08/06/11  Yes [provider]  folic acid (FOLVITE) 825 MCG tablet Take 400 mcg by mouth daily.     Yes [provider]  furosemide (LASIX) 40 MG tablet Take 40 mg by mouth daily.     Yes [provider]  gabapentin (NEURONTIN) 100  MG capsule TAKE 1 CAPSULE BY MOUTH TWICE DAILY 10/17/16  Yes Regal, Tamala Fothergill, DPM  lisinopril (PRINIVIL,ZESTRIL) 20 MG tablet Take 20 mg by mouth daily. 10/07/16  Yes [provider]  loratadine (CLARITIN) 10 MG tablet Take 10 mg by mouth daily as needed. For allergies.   Yes [provider]  mycophenolate (CELLCEPT) 500 MG tablet TK 2 TS PO BID 03/29/15  Yes [provider]  omega-3 acid ethyl esters (LOVAZA) 1 g capsule Take 4 capsules by mouth daily. 10/09/16  Yes [provider]  potassium chloride SA (K-DUR,KLOR-CON) 20 MEQ tablet Take 40 mEq by mouth daily.    Yes [provider]  predniSONE (DELTASONE) 5 MG tablet Take 5 mg by mouth daily. Taper as directed by physician FOR 30 DAYS 09/05/16  Yes [provider]  psyllium (HYDROCIL/METAMUCIL) 95 % PACK Take 1 packet by mouth daily. 05/15/11  Yes [name], MD  sodium chloride (OCEAN) 0.65 % nasal spray Place 1 spray into the nose as needed. For allergies.   Yes [provider]  ULORIC 40 MG tablet Take 40 mg by mouth daily. 06/20/16  Yes [provider]  vitamin A 8000 UNIT capsule Take 8,000 Units by mouth daily.   Yes [provider]  vitamin B-12 (CYANOCOBALAMIN) 1000 MCG tablet Take 1,000 mcg by mouth daily.     Yes [provider]  vitamin E 600 UNIT capsule Take 600 Units by mouth daily.     Yes [provider]  oxyCODONE-acetaminophen (PERCOCET/ROXICET) 5-325 MG tablet Take 1 tablet by mouth every 6 (six) hours as needed for severe pain. Patient not taking: Reported on 10/22/2016 12/25/14   Ward, Ozella Almond, [place]    Physical Exam: Vitals:   10/22/16 1123 10/22/16 1317 10/22/16 1809 10/22/16 1937  BP: 127/62 123/65  112/64  Pulse: 87 71  74  Resp:  19  18  Temp:  (!) 93.3 F (34.1 C) (!) 94.2 F (34.6 C)   TempSrc:  Rectal Rectal   SpO2: 100% 100%  97%      Constitutional: NAD, calm, comfortable Eyes: PERTLA, lids and  conjunctivae normal ENMT: Mucous membranes are moist. Posterior pharynx clear of any exudate or lesions.   Neck: normal, supple, no masses, no thyromegaly Respiratory: clear to auscultation bilaterally, no wheezing, no crackles. Normal respiratory effort.    Cardiovascular: S1 & S2 heard, regular rate and rhythm. No significant JVD. Abdomen: No distension, no tenderness, no masses palpated. Bowel sounds normal.  Musculoskeletal: no clubbing / cyanosis. No joint deformity upper and lower extremities.   Skin: no significant rashes, lesions, ulcers. Warm, dry, well-perfused. Neurologic: CN 2-12 grossly intact. Sensation intact. Strength 5/5 in all 4 limbs.  Psychiatric: Alert and oriented x 3. Calm, cooperative.     Labs on Admission: I have personally reviewed following labs and imaging studies  CBC:  Recent Labs Lab 10/22/16 1359  WBC 4.8  NEUTROABS 3.3  HGB 11.1*  HCT 35.0*  MCV 83.1  PLT 161   Basic Metabolic Panel:  Recent Labs Lab 10/22/16 1359  NA 139  K 3.8  CL 103  CO2 23  GLUCOSE 109*  BUN 24*  CREATININE 0.58  CALCIUM 9.5   GFR: CrCl cannot be calculated (Unknown ideal weight.). Liver Function Tests:  Recent Labs Lab 10/22/16 1359  AST 31  ALT 28  ALKPHOS 50  BILITOT 0.5  PROT 6.5  ALBUMIN 3.7   No results for input(s): LIPASE, AMYLASE in the last 168 hours. No results for input(s): AMMONIA in the last 168 hours. Coagulation Profile: No results for input(s): INR, PROTIME in the last 168 hours. Cardiac Enzymes:  Recent Labs Lab 10/22/16 1600  TROPONINI <0.03   BNP (last 3 results) No results for input(s): PROBNP in the last 8760 hours. HbA1C: No results for input(s): HGBA1C in the last 72 hours. CBG: No results for input(s): GLUCAP in the last 168 hours. Lipid Profile: No results for input(s): CHOL, HDL, LDLCALC, TRIG, CHOLHDL, LDLDIRECT in the last 72 hours. Thyroid Function Tests: No results for input(s): TSH, T4TOTAL, FREET4,  T3FREE, THYROIDAB in the last 72 hours. Anemia Panel: No results for input(s): VITAMINB12, FOLATE, FERRITIN, TIBC, IRON, RETICCTPCT in the last 72 hours. Urine analysis:    Component Value Date/Time   COLORURINE YELLOW 10/22/2016 1753   APPEARANCEUR HAZY (A) 10/22/2016 1753   LABSPEC 1.017 10/22/2016 1753   PHURINE 5.0 10/22/2016 1753   GLUCOSEU NEGATIVE 10/22/2016 1753   HGBUR NEGATIVE 10/22/2016 1753   BILIRUBINUR NEGATIVE 10/22/2016 1753   KETONESUR NEGATIVE 10/22/2016  [place] 10/22/2016 1753   UROBILINOGEN 0.2 04/30/2013 1803   NITRITE NEGATIVE 10/22/2016 1753   LEUKOCYTESUR MODERATE (A) 10/22/2016 1753   Sepsis Labs: @LABRCNTIP (procalcitonin:4,lacticidven:4) )No results found for this or any previous visit (from the past 240 hour(s)).   Radiological Exams on Admission: Dg Ribs Unilateral W/chest Left  Result Date: 10/22/2016 CLINICAL DATA:  Generalized weakness, headache EXAM: LEFT RIBS AND CHEST - 3+ VIEW COMPARISON:  03/31/2009 FINDINGS: Elevation of the right hemidiaphragm, stable. Right Port-A-Cath in place with the tip in the SVC. Right base atelectasis. No pneumothorax. Left lung clear. Heart is normal size. No visible rib fracture or pneumothorax. IMPRESSION: Elevated right hemidiaphragm with right base atelectasis. Right Port-A-Cath tip in the SVC.  No pneumothorax. Electronically Signed   By: Rolm Baptise M.D.   On: 10/22/2016 15:56   Ct Head Wo Contrast  Result Date: 10/22/2016 CLINICAL DATA:  Weakness and headache. EXAM: CT HEAD WITHOUT CONTRAST TECHNIQUE: Contiguous axial images were obtained from the base of the skull through the vertex without intravenous contrast. COMPARISON:  Brain MRI 04/22/2012 FINDINGS: Brain: No mass lesion, intraparenchymal hemorrhage or extra-axial collection. No evidence of acute cortical infarct. There is periventricular hypoattenuation compatible with chronic microvascular disease. Vascular: No hyperdense vessel or  unexpected calcification. Skull: Normal visualized skull base, calvarium and extracranial soft tissues. Sinuses/Orbits: No sinus fluid levels or advanced mucosal thickening. No mastoid effusion. Normal orbits. IMPRESSION: Advanced chronic ischemic microangiopathy, unchanged from MRI of 04/22/2012. No acute abnormality. Electronically Signed   By: Ulyses Jarred M.D.   On: 10/22/2016 15:19   Ct Angio Chest Pe W/cm &/or Wo Cm  Result Date: 10/22/2016 CLINICAL DATA:  PE suspected, intermediate probability, positive D-dimer. Generalized weakness and headache. Symptoms began after receiving a flu shot. EXAM: CT ANGIOGRAPHY CHEST WITH CONTRAST TECHNIQUE: Multidetector CT imaging of the chest was performed using the standard protocol during bolus administration of intravenous contrast. Multiplanar CT image reconstructions and MIPs were obtained to evaluate the vascular anatomy. CONTRAST:  55 mL Isovue 370. COMPARISON:  CT head chest and rib radiographs of the same day. CT of the chest 06/16/2006 FINDINGS: Cardiovascular: The heart size is normal. Coronary artery calcifications are present. Minimal atherosclerotic changes are present in the aorta and great vessel origins without significant aneurysm or stenosis. Pulmonary artery opacification is excellent. No focal filling defects are present to suggest pulmonary embolus. Mediastinum/Nodes: No significant mediastinal or axillary adenopathy is present. Esophagus is within normal limits. The right IJ Port-A-Cath is in place. The tip terminates just above the cavoatrial junction. Lungs/Pleura: Dependent atelectasis is present in the lungs bilaterally, right greater than left. There is minimal consolidation at the right lung base. The right hemidiaphragm is elevated. No significant pleural effusion is present. There is no pneumothorax. Upper Abdomen: A right upper pole renal cyst is stable. Limited imaging of the abdomen is otherwise unremarkable. Musculoskeletal: Vertebral  body heights alignment are maintained. The ribs are within normal limits. Review of the MIP images confirms the above findings. IMPRESSION: 1. No evidence for pulmonary embolus. 2. Atherosclerosis including coronary artery disease. 3. Asymmetric right lower lobe atelectasis. 4. Stable right renal cyst. Electronically Signed   By: San Morelle M.D.   On: 10/22/2016 19:01    EKG: Independently reviewed. Sinus rhythm, PAC's, non-specific ST abnormality.   Assessment/Plan  1. Hypothermia  - Pt presents with non-specific malaise, found to have core temp of 34.1 C - No infectious s/s, no focal neurologic abnormality; has documented hx of  adrenal insufficiency, but no electrolyte abnormality or features to suggest that now  - She was treated with warming blankets in ED   - Plan to check TSH, random cortisol, blood cultures, CK, fibrinogen  - Continue warming with frequent temperature checks   2. Polymyositis - Stable  - Managed with low-dose prednisone and Cellcept, will continue   3. Chronic diastolic CHF  - Appears hypovolemic on admission - Was treated with 500 cc NS in ED  - Plan to follow I/O's and daily wts, continue beta-blocker, hold Lasix   4. Hypertension  - BP is at goal  - Continue lisinopril and atenolol    5. Anemia  - Hgb is 11.1 on admission, stable  - Continue B-12 supplementation    DVT prophylaxis: sq heparin  Code Status: Full  Family Communication: Discussed with patient Disposition Plan: Observe on telemetry unit Consults called: None Admission status: Observation    Vianne Bulls, MD [facility] Pager [phone]  If 7PM-7AM, please contact night-coverage [email or web address] Password [email or web address]  10/22/2016, 7:56 PM

## 2016-10-22 NOTE — ED Notes (Signed)
Patient taken off of warming blanket

## 2016-10-22 NOTE — ED Notes (Signed)
Unable tocollect labs patient wants labs collect out of her port

## 2016-10-22 NOTE — ED Notes (Signed)
Bed: CB63 Expected date:  Expected time:  Means of arrival:  Comments: Room 20

## 2016-10-22 NOTE — ED Notes (Signed)
Below order not completed by EW. 

## 2016-10-22 NOTE — ED Provider Notes (Signed)
[facility] DEPT Provider Note   CSN: [id number] Arrival date & time: 10/22/16  1101     History   Chief Complaint Chief Complaint  Patient presents with  . Weakness  . Headache    HPI [name] [name] is a 76 y.o. female.  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 Patient states all of her symptoms started after getting her flu shot last week. She complains of headache that has been persistent for the last 7 days. It seems to be worse at night. It is improved with Tylenol but then it just comes back. She describes it is involving her whole head. No vision changes, unilateral numbness or weakness. She denies any nausea or vomiting. Intermittently she has had poor appetite but that has not been constant. She also noticed she had some pain in her left rib cage around the same time. She denies any trauma. She has had a mild cough but denies productive cough. The pain in her chest is worse with deep breathing and sharp in nature. She denies any shortness of breath, new abdominal or leg swelling. More recently they were changing some of her medications to try to help with the swelling. They had stopped her atenolol and given her 2 new medications however she did not tolerate those medications well so went back on her atenolol.  She denies any urinary symptoms or abdominal pain at this time.   The history is provided by the patient.    Past Medical History:  Diagnosis Date  . Allergic rhinitis, cause unspecified   . Benign neoplasm of colon   . Esophageal reflux   . Excessive daytime sleepiness   . 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 GI hemorrhage 2011   recurrent  . Hyperlipidemia   . Osteoporosis   . Polymyositis ([place])   . Unspecified essential hypertension   . Vitamin D deficiency     Patient Active  Problem List   Diagnosis Date Noted  . Abdominal pain, unspecified site 05/17/2013  . Nausea alone 05/17/2013  . 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 Abdominal pain, left lower quadrant 05/28/2011  . Cecal ulcer with hemorrhage 01/10/2011  . Acute blood loss anemia 12/06/2010  . Hematochezia 12/05/2010  . Anemia 12/05/2010  . Adrenal insufficiency ([place]) 12/05/2010  . Osteoporosis 12/05/2010  . LUNG INVOLVEMENT OTHER DISEASES CLASSIFIED ELSW 08/13/2009  . OTHER SPECIFIED DISORDER OF STOMACH AND DUODENUM 07/06/2008  . HYPERTENSION 04/26/2007  . ALLERGIC RHINITIS 04/26/2007  . GASTROESOPHAGEAL REFLUX DISEASE 04/26/2007  . Polymyositis ([place]) 04/26/2007  . COLONIC POLYPS, ADENOMATOUS 02/06/2005    Past Surgical History:  Procedure Laterality Date  . ABDOMINAL HYSTERECTOMY     partial  . bilateral  rotator cuff surgery    . COLONOSCOPY  01/09/2011   others also  . COLONOSCOPY  05/29/2011   Procedure: COLONOSCOPY;  Surgeon: Gatha Mayer, MD;  Location: [facility] ENDOSCOPY;  Service: Endoscopy;  Laterality: N/A;  Greggory Brandy Carlean Purl  . COLONOSCOPY WITH PROPOFOL N/A 02/16/2014   Procedure: COLONOSCOPY WITH PROPOFOL;  Surgeon: Milus Banister, MD;  Location: [facility] ENDOSCOPY;  Service: Endoscopy;  Laterality: N/A;  . ESOPHAGOGASTRODUODENOSCOPY  12/08/2010   others also  . ESOPHAGOGASTRODUODENOSCOPY (EGD) WITH PROPOFOL N/A 02/16/2014   Procedure: ESOPHAGOGASTRODUODENOSCOPY (EGD) WITH PROPOFOL;  Surgeon: Milus Banister, MD;  Location: [facility];  Service: Endoscopy;  Laterality: N/A;  . EUS    . HOT HEMOSTASIS  05/29/2011   Procedure: HOT HEMOSTASIS (ARGON PLASMA COAGULATION/BICAP);  Surgeon: Gatha Mayer, MD;  Location: [facility];  Service: Endoscopy;  Laterality: N/A;  . LUMBAR LAMINECTOMY    . TONSILLECTOMY  age 16    OB History    No data available       Home Medications    Prior to Admission medications   Medication Sig Start Date End Date Taking?  Authorizing Provider  allopurinol (ZYLOPRIM) 300 MG tablet Take 300 mg by mouth daily. 10/11/16  Yes [provider]  ALPRAZolam (XANAX) 0.25 MG tablet Take 0.25 mg by mouth 3 (three) times daily as needed. Anxiety and sleep   Yes [provider]  Ascorbic Acid (VITAMIN C) 1000 MG tablet Take 1,000 mg by mouth daily.    Yes [provider]  atenolol (TENORMIN) 50 MG tablet Take 50 mg by mouth daily.     Yes [provider]  colchicine 0.6 MG tablet Take 0.6 mg by mouth. Take 1 tablet by mouth every 8 hours as needed for gout flare 10/13/16  Yes [provider]  dicyclomine (BENTYL) 10 MG capsule Take 10 mg by mouth daily. 07/22/16  Yes [provider]  esomeprazole (NEXIUM) 40 MG capsule Take one capsule by thirty minutes before breakfast Take one capsule by thirty minutes before dinner Patient taking differently: Take 40 mg by mouth daily. Take one capsule by thirty minutes before breakfast Take one capsule by thirty minutes before dinner 05/11/13  Yes Zehr, Janett Billow D, PA-C  fluticasone (FLONASE) 50 MCG/ACT nasal spray Place 1 spray into both nostrils daily. 08/06/11  Yes [provider]  furosemide (LASIX) 40 MG tablet Take 40 mg by mouth daily.     Yes [provider]  gabapentin (NEURONTIN) 100 MG capsule TAKE 1 CAPSULE BY MOUTH TWICE DAILY 10/17/16  Yes Regal, Tamala Fothergill, DPM  lisinopril (PRINIVIL,ZESTRIL) 20 MG tablet Take 20 mg by mouth daily. 10/07/16  Yes [provider]  mycophenolate (CELLCEPT) 500 MG tablet TK 2 TS PO BID 03/29/15  Yes [provider]  omega-3 acid ethyl esters (LOVAZA) 1 g capsule Take 4 capsules by mouth daily. 10/09/16  Yes [provider]  potassium chloride SA (K-DUR,KLOR-CON) 20 MEQ tablet Take 40 mEq by mouth daily.    Yes [provider]  predniSONE (DELTASONE) 5 MG tablet Take 5 mg by mouth daily. Taper as directed by physician FOR 30 DAYS 09/05/16  Yes [provider]  psyllium (HYDROCIL/METAMUCIL) 95 % PACK Take 1 packet by mouth daily. 05/15/11  Yes [name], MD  ULORIC 40 MG tablet Take 40 mg by mouth daily. 06/20/16  Yes [provider]  Calcium Carbonate (CALTRATE 600) 1500 MG TABS Take 2 tablets by mouth daily. Daily    [provider]  Cholecalciferol (VITAMIN D PO) Take 1,200 mg by mouth daily.    [provider]  folic acid (FOLVITE) 423 MCG tablet Take 400 mcg by mouth daily.      [provider]  loratadine (CLARITIN) 10 MG tablet Take 10 mg by mouth daily as needed. For allergies.    [provider]  oxyCODONE-acetaminophen (PERCOCET/ROXICET) 5-325 MG tablet Take 1 tablet by mouth every 6 (six) hours as needed for severe pain. Patient not taking: Reported on 10/22/2016 12/25/14   Ward, Ozella Almond, PA-C  sodium chloride (OCEAN) 0.65 % nasal spray Place 1 spray  into the nose as needed. For allergies.    [provider]  vitamin A 8000 UNIT capsule Take 8,000 Units by mouth daily.    [provider]  vitamin B-12 (CYANOCOBALAMIN) 1000 MCG tablet Take 1,000 mcg by mouth daily.      [provider]  vitamin E 600 UNIT capsule Take 600 Units by mouth daily.      [provider]    Family History Family History  Problem Relation Age of Onset  . Dementia Mother   . Hypertension Father   . Malignant hyperthermia Father   . Colon cancer Neg Hx     Social History Social History  Substance Use Topics  . Smoking status: Never Smoker  . Smokeless tobacco: Former Systems developer    Types: Snuff     Comment: Tobacco info given 05/11/13  . Alcohol use No     Allergies   Codeine and Hydrocodone   Review of Systems Review of Systems  All other systems reviewed and are negative.    Physical Exam Updated Vital Signs BP 123/65   Pulse 71   Temp (!) 93.3 F (34.1 C) (Rectal)   Resp 19   SpO2 100%   Physical Exam  Constitutional: She is oriented to  person, place, and time. She appears well-developed and well-nourished. No distress.  HENT:  Head: Normocephalic and atraumatic.  Mouth/Throat: Oropharynx is clear and moist.  Eyes: Pupils are equal, round, and reactive to light. Conjunctivae and EOM are normal.  Neck: Normal range of motion. Neck supple.  Cardiovascular: Normal rate, regular rhythm and intact distal pulses.   No murmur heard. Pulmonary/Chest: Effort normal and breath sounds normal. No respiratory distress. She has no wheezes. She has no rales. She exhibits tenderness.    Abdominal: Soft. She exhibits no distension. There is no tenderness. There is no rebound and no guarding.  Musculoskeletal: Normal range of motion. She exhibits no edema or tenderness.  Neurological: She is alert and oriented to person, place, and time. She has normal strength. No sensory deficit.  Skin: Skin is warm and dry. No rash noted. No erythema.  Psychiatric: She has a normal mood and affect. Her behavior is normal.  Nursing note and vitals reviewed.    ED Treatments / Results  Labs (all labs ordered are listed, but only abnormal results are displayed) Labs Reviewed  COMPREHENSIVE METABOLIC PANEL - Abnormal; Notable for the following:       Result Value   Glucose, Bld 109 (*)    BUN 24 (*)    All other components within normal limits  CBC WITH DIFFERENTIAL/PLATELET - Abnormal; Notable for the following:    Hemoglobin 11.1 (*)    HCT 35.0 (*)    RDW 16.8 (*)    All other components within normal limits  TROPONIN I  URINALYSIS, ROUTINE W REFLEX MICROSCOPIC  D-DIMER, QUANTITATIVE (NOT AT [facility])  I-STAT CG4 LACTIC ACID, ED    EKG  EKG Interpretation  Date/Time:  Wednesday October 22 2016 13:13:27 EDT Ventricular Rate:  80 PR Interval:    QRS Duration: 104 QT Interval:  419 QTC Calculation: 415 R Axis:   42 Text Interpretation:  Sinus rhythm Atrial premature complexes Consider left atrial enlargement Probable left ventricular  hypertrophy more pronounced ST elevation suggests acute pericarditis Confirmed by Blanchie Dessert [phone]) on 10/22/2016 3:46:49 PM       Radiology Ct Head Wo Contrast  Result Date: 10/22/2016 CLINICAL DATA:  Weakness and headache. EXAM: CT HEAD  WITHOUT CONTRAST TECHNIQUE: Contiguous axial images were obtained from the base of the skull through the vertex without intravenous contrast. COMPARISON:  Brain MRI 04/22/2012 FINDINGS: Brain: No mass lesion, intraparenchymal hemorrhage or extra-axial collection. No evidence of acute cortical infarct. There is periventricular hypoattenuation compatible with chronic microvascular disease. Vascular: No hyperdense vessel or unexpected calcification. Skull: Normal visualized skull base, calvarium and extracranial soft tissues. Sinuses/Orbits: No sinus fluid levels or advanced mucosal thickening. No mastoid effusion. Normal orbits. IMPRESSION: Advanced chronic ischemic microangiopathy, unchanged from MRI of 04/22/2012. No acute abnormality. Electronically Signed   By: Ulyses Jarred M.D.   On: 10/22/2016 15:19    Procedures Procedures (including critical care time)  Medications Ordered in ED Medications  metoCLOPramide (REGLAN) injection 10 mg (10 mg Intravenous Given 10/22/16 1425)  sodium chloride 0.9 % bolus 500 mL (0 mLs Intravenous Stopped 10/22/16 1525)     Initial Impression / Assessment and Plan / ED Course  I have reviewed the triage vital signs and the nursing notes.  Pertinent labs & imaging results that were available during my care of the patient were reviewed by me and considered in my medical decision making (see chart for details).     Patient is a 76 year old lady presenting today with vague complaints of persistent headache, left-sided more pleuritic chest pain and just not feeling great. She denies any fever, reductive cough, abdominal pain, nausea or vomiting. She denies any urinary symptoms. Patient's vital signs of been within  normal limits except for a low oral temperature. There are no evidence of sepsis currently. EKG is relatively unchanged from prior may be slightly more ST elevation diffusely then in the past. She is not describing any cardiac symptoms such as shortness of breath, chest pressure, worsening with exertion. She has still been able to get around her home with her walker and cane. She denies any falls or trauma. Patient's headache is global and she has no specific deficits from this. It does seem to improve with Tylenol but then it comes back. Patient was given Reglan IV fluid for her headache. There is no evidence of sepsis today. EKG is unchanged but will check a troponin to ensure no atypical cardiac symptoms. Chest x-ray is pending to evaluate for potential pneumonia. Patient is low risk [place].  She is having no abdominal symptoms concerning for gastritis diverticulitis, appendicitis, cholecystitis. Low suspicion for UTI at this time. Patient does appear slightly dehydrated but is otherwise well-appearing has a normal neurologic exam. Head CT without acute findings. Troponin and UA are pending.  CBC and CMP wnl. CXR wnl.  Trop wnl.  D-dimer is pending.  Final Clinical Impressions(s) / ED Diagnoses   Final diagnoses:  None    New Prescriptions New Prescriptions   No medications on file     Blanchie Dessert, MD 10/26/16 1904

## 2016-10-22 NOTE — ED Provider Notes (Signed)
6:00 PM- D-dimer positive. Patient with possible PE. CTA ordered.  18: 45-patient is alert, calm and comfortable.  She is still hypothermic.  She has eaten an entire sandwich.  She relates several days of feeling cold, and general weakness.  She denies anorexia, vomiting, paresthesia or focal weakness.  19: 25-CTA, PE protocol negative for abnormality.  Patient remains hypothermic.   Patient Vitals for the past 24 hrs:  BP Temp Temp src Pulse Resp SpO2  10/22/16 1809 - (!) 94.2 F (34.6 C) Rectal - - -  10/22/16 1317 123/65 (!) 93.3 F (34.1 C) Rectal 71 19 100 %  10/22/16 1123 127/62 - - 87 - 100 %    MDM-elderly female presenting for evaluation of general malaise following influenza administration.  No localizing symptoms, and reassuring workup here.  Patient remains hypothermic, therefore will order overnight observation for support, and consideration for other causes of hypothermia.  Doubt sepsis, metabolic instability or impending vascular collapse.  Blood cultures have been sent.  Lactate is normal.  7:29 PM-Consult complete with hospitalist. Patient case explained and discussed.  He agrees to admit patient for further evaluation and treatment. Call ended at 19: 39     Daleen Bo, MD 10/22/16 2055

## 2016-10-23 DIAGNOSIS — E2740 Unspecified adrenocortical insufficiency: Secondary | ICD-10-CM

## 2016-10-23 DIAGNOSIS — T68XXXA Hypothermia, initial encounter: Secondary | ICD-10-CM | POA: Diagnosis not present

## 2016-10-23 DIAGNOSIS — M3320 Polymyositis, organ involvement unspecified: Secondary | ICD-10-CM | POA: Diagnosis not present

## 2016-10-23 DIAGNOSIS — D649 Anemia, unspecified: Secondary | ICD-10-CM | POA: Diagnosis not present

## 2016-10-23 DIAGNOSIS — I10 Essential (primary) hypertension: Secondary | ICD-10-CM | POA: Diagnosis not present

## 2016-10-23 DIAGNOSIS — I5032 Chronic diastolic (congestive) heart failure: Secondary | ICD-10-CM | POA: Diagnosis not present

## 2016-10-23 LAB — BASIC METABOLIC PANEL
Anion gap: 11 (ref 5–15)
BUN: 24 mg/dL — ABNORMAL HIGH (ref 6–20)
CALCIUM: 9 mg/dL (ref 8.9–10.3)
CO2: 23 mmol/L (ref 22–32)
CREATININE: 0.65 mg/dL (ref 0.44–1.00)
Chloride: 107 mmol/L (ref 101–111)
GFR calc Af Amer: >60 mL/min (ref >60)
GFR calc non Af Amer: >60 mL/min (ref >60)
GLUCOSE: 87 mg/dL (ref 65–99)
Potassium: 3.3 mmol/L — ABNORMAL LOW (ref 3.5–5.1)
Sodium: 141 mmol/L (ref 135–145)

## 2016-10-23 LAB — CBC
HCT: 31.8 % — ABNORMAL LOW (ref 36.0–46.0)
HEMOGLOBIN: 10.3 g/dL — AB (ref 12.0–15.0)
MCH: 26.8 pg (ref 26.0–34.0)
MCHC: 32.4 g/dL (ref 30.0–36.0)
MCV: 82.6 fL (ref 78.0–100.0)
Platelets: 360 10*3/uL (ref 150–400)
RBC: 3.85 MIL/uL — ABNORMAL LOW (ref 3.87–5.11)
RDW: 17.2 % — ABNORMAL HIGH (ref 11.5–15.5)
WBC: 5.9 10*3/uL (ref 4.0–10.5)

## 2016-10-23 LAB — CORTISOL: Cortisol, Plasma: 2.7 ug/dL

## 2016-10-23 MED ORDER — SODIUM CHLORIDE 0.9% FLUSH
10.0000 mL | INTRAVENOUS | Status: DC | PRN
  Administered 2016-10-23 – 2016-10-24 (×2): 10 mL
  Filled 2016-10-23 (×2): qty 40

## 2016-10-23 MED ORDER — POTASSIUM CHLORIDE CRYS ER 20 MEQ PO TBCR
40.0000 meq | EXTENDED_RELEASE_TABLET | Freq: Once | ORAL | Status: AC
  Administered 2016-10-23: 40 meq via ORAL
  Filled 2016-10-23: qty 2

## 2016-10-23 NOTE — Progress Notes (Signed)
Initial Nutrition Assessment  DOCUMENTATION CODES:   Severe malnutrition in context of acute illness/injury  INTERVENTION:   -Continue Ensure Enlive BID each supplement provides 350 kcals and 20 grams of protein  NUTRITION DIAGNOSIS:   Malnutrition (severe) related to acute illness (severe headaches, malaise, poor PO intake) as evidenced by percent weight loss, energy intake < or equal to 50% for > or equal to 5 days.  GOAL:   Patient will meet greater than or equal to 90% of their needs  MONITOR:   PO intake, Supplement acceptance, Labs, Weight trends  REASON FOR ASSESSMENT:   Malnutrition Screening Tool   ASSESSMENT:   Pt is a 76 year old female admitted for a severe headache, malaise, and hypothermia. PMH of HTN, CHF, polymyositis, and excessive daytime sleepiness.   Pt has been experiencing headache and malaise for the past week and she believes it is due to her getting the flu shot about a week ago. Pt reports that her appetite and PO intake have not been as good as usual over the past week and since admission. Pt reports that her headache has gone away and she is beginning to feel more like herself.    Pt ate 50% of her breakfast this morning which consisted of eggs, grits, bacon, and apple juice. Pt ate 50% of lunch consisting of pot roast, mashed potatoes, beans, pudding, and a roll. At home pt has ate about half of her meals over the past week. Pt reports drinking Boost every once in a while but not consistently. Pt is receiving Ensure BID here and says she likes it.  Pt reports an intentional weight loss of 20 lbs over the past six months due to lifestyle changes such as smaller portions and exercising/stretching in her home. Pt reports she is eating foods she always has but in smaller portions. Pt did recognize that she had lost weight in the past couple of weeks d/t poor PO intake but was unsure of amount.  Per weight records in chart pt weighed 158 on 10/10, 172 on  9/7, 170 on 6/11, and 178 on 5/7. This indicates a 8% weight loss in a month, severe for time frame.   Medications: Vit D, Lovenox, Folvite, Lovaza, Protonix, NS 9%, Vit B12  Labs: K 3.3 (L), BUN 24 (L), Hemoglobin 10.3 (L)  Nutrition Focused Physical Exam Findings: Mild to moderate fat depletion. Mild to moderate muscle depletion. Mild edema in ankles.  Diet Order:  Diet regular Room service appropriate? Yes; Fluid consistency: Thin  Skin:  Reviewed, no issues  Last BM:  10/22/16  Height:   Ht Readings from Last 1 Encounters:  10/22/16 5\' 3"  (1.6 m)    Weight:   Wt Readings from Last 1 Encounters:  10/22/16 158 lb 1.1 oz (71.7 kg)    Ideal Body Weight:  52.3 kg  BMI:  Body mass index is 28 kg/m.  Estimated Nutritional Needs:   Kcal:  1400-1600 kcals  Protein:  60-70 grams protein  Fluid:  >/= 2 L  EDUCATION NEEDS:   No education needs identified at this time  [place] Intern Pager: [phone] 10/23/2016 3:03 PM

## 2016-10-23 NOTE — Plan of Care (Signed)
Problem: Safety: Goal: Ability to remain free from injury will improve Outcome: Completed/Met Date Met: 10/23/16 Bed alarm in place. Pt is aware to call for assistance

## 2016-10-23 NOTE — Progress Notes (Signed)
PROGRESS NOTE    [name] [name]  WNI:[id number] DOB: 10-31-1940 DOA: 10/22/2016 PCP: [name], MD   Chief Complaint  Patient presents with  . Weakness  . Headache    Brief Narrative:  76 y.o. female with medical history significant for hypertension, chronic diastolic CHF, polymyositis, and excessive daytime sleepiness, now presenting to the emergency department for evaluation of headache and malaise. Patient reports that she maintain her usual state of health until approximately one week ago when she noted the insidious development of mild headache and a general malaise. Symptoms have persisted largely unchanged for the past week, now prompting her to come into the ED. She denies any change in vision or hearing and denies any focal numbness or weakness. There has not been any chest pain, shortness of breath, or palpitations associated with this. No recent fall or trauma. Reports that she had a recent changes made to her gout medications, but otherwise no changes. Denies any alcohol use.  Admitted for hypothermia, sepsis.  Assessment & Plan   Hypothermia -presented with temp of 93.11F, general malaise and headache -CT head shows advanced chronic ischemic microangiopathy, no acute abnormality -feels this is due to influenza vaccine she received 1 week ago -currently no infectious signs or symptoms -UA unremarkable for infection -CTA chest negative for PE, no mention of infectious etiology -Currently no neurological abnormalities -Patient was given warming blankets and the -TSH 1.080 -Cortisol 2.7 (WNL for PM cortisol level) -blood cultures pending  -CK 892 -Fibrinogen 401  History of polymyositis -stable, continue prednisone and CellCept  Midly elevated CK/rhabdo -Possibly from hypothermia vs polymyositis -placed on gentle IVF -continue to monitor   Chronic heart failure -No echocardiogram in Epic/ CHF not listed on patient's medical history -Appears to be  euvolemic -monitor intake and output, daily weight -Lasix currently held  Essential hypertension -Blood pressure appears to be stable -Continue lisinopril, atenolol  Chronic normocytic anemia -hemoglobin currently 10.3, baseline between 9 and 10 -Continue to monitor CBC  DVT Prophylaxis  lovenox   Code Status: Full  Family Communication: None at bedside  Disposition Plan: Currently in observation. Possibly home on 10/24/2016 pending improvement  Consultants None  Procedures  None  Antibiotics   Anti-infectives    None      Subjective:   Jordanne Elsbury seen and examined today.  Patient state she is feeling better. Denies chest pain, shortness of breath, abdominal pain, N/V/D/C. Feels headache has improved. Continues to feel tired. Feels weak.     Objective:   Vitals:   10/22/16 2333 10/23/16 0442 10/23/16 1144 10/23/16 1358  BP: 135/73 134/64 106/60   Pulse: 85 91  86  Resp: 18 18    Temp: 98 F (36.7 C) 98 F (36.7 C)  97.8 F (36.6 C)  TempSrc: Oral Oral  Oral  SpO2: 98% 100%    Weight: 71.7 kg (158 lb 1.1 oz)     Height: 5\' 3"  (1.6 m)       Intake/Output Summary (Last 24 hours) at 10/23/16 1518 Last data filed at 10/23/16 0522  Gross per 24 hour  Intake               10 ml  Output                0 ml  Net               10 ml   Filed Weights   10/22/16 2333  Weight: 71.7 kg (158 lb 1.1  oz)    Exam  General: Well developed, well nourished, NAD, appears stated age  3: NCAT, mucous membranes moist.   Cardiovascular: S1 S2 auscultated, RRR, no murmurs  Respiratory: Clear to auscultation bilaterally with equal chest rise  Abdomen: Soft, nontender, nondistended, + bowel sounds  Extremities: warm dry without cyanosis clubbing or edema  Neuro: AAOx3, nonfocal  Psych: Appropriate   Data Reviewed: I have personally reviewed following labs and imaging studies  CBC:  Recent Labs Lab 10/22/16 1359 10/23/16 0520  WBC 4.8 5.9   NEUTROABS 3.3  --   HGB 11.1* 10.3*  HCT 35.0* 31.8*  MCV 83.1 82.6  PLT 384 154   Basic Metabolic Panel:  Recent Labs Lab 10/22/16 1359 10/23/16 0520  NA 139 141  K 3.8 3.3*  CL 103 107  CO2 23 23  GLUCOSE 109* 87  BUN 24* 24*  CREATININE 0.58 0.65  CALCIUM 9.5 9.0   GFR: Estimated Creatinine Clearance: 55.9 mL/min (by C-G formula based on SCr of 0.65 mg/dL). Liver Function Tests:  Recent Labs Lab 10/22/16 1359  AST 31  ALT 28  ALKPHOS 50  BILITOT 0.5  PROT 6.5  ALBUMIN 3.7   No results for input(s): LIPASE, AMYLASE in the last 168 hours. No results for input(s): AMMONIA in the last 168 hours. Coagulation Profile: No results for input(s): INR, PROTIME in the last 168 hours. Cardiac Enzymes:  Recent Labs Lab 10/22/16 1600 10/22/16 2008  CKTOTAL  --  892*  TROPONINI <0.03  --    BNP (last 3 results) No results for input(s): PROBNP in the last 8760 hours. HbA1C: No results for input(s): HGBA1C in the last 72 hours. CBG: No results for input(s): GLUCAP in the last 168 hours. Lipid Profile: No results for input(s): CHOL, HDL, LDLCALC, TRIG, CHOLHDL, LDLDIRECT in the last 72 hours. Thyroid Function Tests:  Recent Labs  10/22/16 2008  TSH 1.080   Anemia Panel: No results for input(s): VITAMINB12, FOLATE, FERRITIN, TIBC, IRON, RETICCTPCT in the last 72 hours. Urine analysis:    Component Value Date/Time   COLORURINE YELLOW 10/22/2016 1753   APPEARANCEUR HAZY (A) 10/22/2016 1753   LABSPEC 1.017 10/22/2016 1753   PHURINE 5.0 10/22/2016 1753   GLUCOSEU NEGATIVE 10/22/2016 1753   HGBUR NEGATIVE 10/22/2016 1753   [place] 10/22/2016 1753   KETONESUR NEGATIVE 10/22/2016 1753   PROTEINUR NEGATIVE 10/22/2016 1753   UROBILINOGEN 0.2 04/30/2013 1803   NITRITE NEGATIVE 10/22/2016 1753   LEUKOCYTESUR MODERATE (A) 10/22/2016 1753   Sepsis Labs: @LABRCNTIP (procalcitonin:4,lacticidven:4)  ) Recent Results (from the past 240 hour(s))   Culture, blood (single)     Status: None (Preliminary result)   Collection Time: 10/22/16  8:08 PM  Result Value Ref Range Status   Specimen Description BLOOD LEFT ANTECUBITAL  Final   Special Requests   Final    IN PEDIATRIC BOTTLE Blood Culture results may not be optimal due to an excessive volume of blood received in culture bottles   Culture   Final    NO GROWTH < 24 HOURS Performed at [facility] [address], [place], [place] [address]    Report Status PENDING  Incomplete      Radiology Studies: Dg Ribs Unilateral W/chest Left  Result Date: 10/22/2016 CLINICAL DATA:  Generalized weakness, headache EXAM: LEFT RIBS AND CHEST - 3+ VIEW COMPARISON:  03/31/2009 FINDINGS: Elevation of the right hemidiaphragm, stable. Right Port-A-Cath in place with the tip in the SVC. Right base atelectasis. No pneumothorax.  Left lung clear. Heart is normal size. No visible rib fracture or pneumothorax. IMPRESSION: Elevated right hemidiaphragm with right base atelectasis. Right Port-A-Cath tip in the SVC.  No pneumothorax. Electronically Signed   By: Rolm Baptise M.D.   On: 10/22/2016 15:56   Ct Head Wo Contrast  Result Date: 10/22/2016 CLINICAL DATA:  Weakness and headache. EXAM: CT HEAD WITHOUT CONTRAST TECHNIQUE: Contiguous axial images were obtained from the base of the skull through the vertex without intravenous contrast. COMPARISON:  Brain MRI 04/22/2012 FINDINGS: Brain: No mass lesion, intraparenchymal hemorrhage or extra-axial collection. No evidence of acute cortical infarct. There is periventricular hypoattenuation compatible with chronic microvascular disease. Vascular: No hyperdense vessel or unexpected calcification. Skull: Normal visualized skull base, calvarium and extracranial soft tissues. Sinuses/Orbits: No sinus fluid levels or advanced mucosal thickening. No mastoid effusion. Normal orbits. IMPRESSION: Advanced chronic ischemic microangiopathy, unchanged from MRI of 04/22/2012.  No acute abnormality. Electronically Signed   By: Ulyses Jarred M.D.   On: 10/22/2016 15:19   Ct Angio Chest Pe W/cm &/or Wo Cm  Result Date: 10/22/2016 CLINICAL DATA:  PE suspected, intermediate probability, positive D-dimer. Generalized weakness and headache. Symptoms began after receiving a flu shot. EXAM: CT ANGIOGRAPHY CHEST WITH CONTRAST TECHNIQUE: Multidetector CT imaging of the chest was performed using the standard protocol during bolus administration of intravenous contrast. Multiplanar CT image reconstructions and MIPs were obtained to evaluate the vascular anatomy. CONTRAST:  55 mL Isovue 370. COMPARISON:  CT head chest and rib radiographs of the same day. CT of the chest 06/16/2006 FINDINGS: Cardiovascular: The heart size is normal. Coronary artery calcifications are present. Minimal atherosclerotic changes are present in the aorta and great vessel origins without significant aneurysm or stenosis. Pulmonary artery opacification is excellent. No focal filling defects are present to suggest pulmonary embolus. Mediastinum/Nodes: No significant mediastinal or axillary adenopathy is present. Esophagus is within normal limits. The right IJ Port-A-Cath is in place. The tip terminates just above the cavoatrial junction. Lungs/Pleura: Dependent atelectasis is present in the lungs bilaterally, right greater than left. There is minimal consolidation at the right lung base. The right hemidiaphragm is elevated. No significant pleural effusion is present. There is no pneumothorax. Upper Abdomen: A right upper pole renal cyst is stable. Limited imaging of the abdomen is otherwise unremarkable. Musculoskeletal: Vertebral body heights alignment are maintained. The ribs are within normal limits. Review of the MIP images confirms the above findings. IMPRESSION: 1. No evidence for pulmonary embolus. 2. Atherosclerosis including coronary artery disease. 3. Asymmetric right lower lobe atelectasis. 4. Stable right renal  cyst. Electronically Signed   By: San Morelle M.D.   On: 10/22/2016 19:01     Scheduled Meds: . allopurinol  300 mg Oral Daily  . atenolol  50 mg Oral Daily  . cholecalciferol  1,000 Units Oral Daily  . dicyclomine  10 mg Oral Daily  . enoxaparin (LOVENOX) injection  40 mg Subcutaneous QHS  . feeding supplement (ENSURE ENLIVE)  237 mL Oral BID BM  . fluticasone  1 spray Each Nare Daily  . folic acid  4,854 mcg Oral Daily  . gabapentin  100 mg Oral BID  . lisinopril  20 mg Oral Daily  . mycophenolate  1,000 mg Oral BID  . omega-3 acid ethyl esters  2 g Oral Daily  . pantoprazole  40 mg Oral Daily  . predniSONE  5 mg Oral Daily  . sodium chloride flush  3 mL Intravenous Q12H  . sodium chloride flush  3 mL Intravenous Q12H  . vitamin B-12  1,000 mcg Oral Daily   Continuous Infusions: . sodium chloride       LOS: 0 days   Time Spent in minutes   30 minutes  Arlester Keehan D.O. on 10/23/2016 at 3:18 PM  Between 7am to 7pm - Pager - [phone]  After 7pm go to [email or web address] - password [email or web address]  And look for the night coverage person covering for me after hours  [facility] Office  [phone]

## 2016-10-23 NOTE — Plan of Care (Signed)
Problem: Education: Goal: Knowledge of [facility] Education information/materials will improve Outcome: Completed/Met Date Met: 10/23/16 Plan of care discussed with patient and daughter. Questions concerns r/t abdominal pain. Provider made aware that scan was negative

## 2016-10-24 DIAGNOSIS — T68XXXA Hypothermia, initial encounter: Secondary | ICD-10-CM | POA: Diagnosis not present

## 2016-10-24 LAB — CBC
HCT: 30.1 % — ABNORMAL LOW (ref 36.0–46.0)
Hemoglobin: 9.6 g/dL — ABNORMAL LOW (ref 12.0–15.0)
MCH: 26.6 pg (ref 26.0–34.0)
MCHC: 31.9 g/dL (ref 30.0–36.0)
MCV: 83.4 fL (ref 78.0–100.0)
PLATELETS: 350 10*3/uL (ref 150–400)
RBC: 3.61 MIL/uL — ABNORMAL LOW (ref 3.87–5.11)
RDW: 17 % — AB (ref 11.5–15.5)
WBC: 6.2 10*3/uL (ref 4.0–10.5)

## 2016-10-24 LAB — BASIC METABOLIC PANEL
ANION GAP: 10 (ref 5–15)
BUN: 22 mg/dL — AB (ref 6–20)
CALCIUM: 9.2 mg/dL (ref 8.9–10.3)
CO2: 24 mmol/L (ref 22–32)
CREATININE: 0.71 mg/dL (ref 0.44–1.00)
Chloride: 108 mmol/L (ref 101–111)
GFR calc Af Amer: >60 mL/min (ref >60)
GLUCOSE: 94 mg/dL (ref 65–99)
Potassium: 4.2 mmol/L (ref 3.5–5.1)
Sodium: 142 mmol/L (ref 135–145)

## 2016-10-24 LAB — CK: Total CK: 698 U/L — ABNORMAL HIGH (ref 38–234)

## 2016-10-24 MED ORDER — LISINOPRIL 5 MG PO TABS: 5.0000 mg | ORAL_TABLET | Freq: Every day | ORAL | Status: DC

## 2016-10-24 NOTE — Discharge Summary (Signed)
Physician Discharge Summary  [name] [name] DGU:[id number] DOB: [birth year] DOA: 10/22/2016  PCP: [name], MD  Admit date: 10/22/2016 Discharge date: 10/24/2016  Admitted From:  Disposition:    Recommendations for Outpatient Follow-up:  1. Follow up with PCP in 1-2 weeks 2. Follow up blood culture  Home Health:None Equipment/Devices: None  Discharge Condition: Stable CODE STATUS: Code Brief/Interim Summary76 y.o.femalewith medical history significant for hypertension, chronic diastolic CHF, polymyositis, and excessive daytime sleepiness, now presenting to the emergency department for evaluation of headache and malaise. Patient reports that she maintain her usual state of health until approximately one week ago when she noted the insidious development of mild headache and a general malaise. Symptoms have persisted largely unchanged for the past week, now prompting her to come into the ED. She denies any change in vision or hearing and denies any focal numbness or weakness. There has not been any chest pain, shortness of breath, or palpitations associated with this. No recent fall or trauma. Reports that she had a recent changes made to her gout medications, but otherwise no changes. Denies any alcohol use. Patient remained stable upon admission. She did not have any more episodes of hypothermia. She was treated symptomatically and her blood cultures are pending at this time. And she is back to baseline. Discharge Diagnoses:  Principal Problem:   Hypothermia Active Problems:   Essential hypertension   Polymyositis (HCC)   Anemia   Adrenal insufficiency (HCC)   Chronic diastolic CHF (congestive heart failure) ([facility])  Hypothermia patient presented with temperature 93.3 Fahrenheit unknown etiology. CT of the head showed chronic ischemic microangiopathy. CT angiogram of the chest negative for PE. Normal TSH and normal cortisol level blood cultures are pending at this time. Currently  his temperature is back to normal. Patient is awake alert and talking on the phone. . Hypertension on beta blocker and ACE inhibitor at this time decrease the dose of antihypertensives since his blood pressure is low to low-normal.  History of polymyositis on prednisone and CellCept as an outpatient continue.  Normocytic anemia stable.  Discharge Instructions   Allergies as of 10/24/2016      Reactions   Codeine Other (See Comments)   Feels drunk   Hydrocodone    "makes me sleepy"      Medication List    TAKE these medications   acetaminophen 500 MG tablet Commonly known as:  TYLENOL Take 1,000 mg by mouth daily as needed.   allopurinol 300 MG tablet Commonly known as:  ZYLOPRIM Take 300 mg by mouth daily.   ALPRAZolam 0.25 MG tablet Commonly known as:  XANAX Take 0.25 mg by mouth 3 (three) times daily as needed. Anxiety and sleep   atenolol 50 MG tablet Commonly known as:  TENORMIN Take 50 mg by mouth daily.   CALTRATE 600 1500 (600 Ca) MG Tabs tablet Generic drug:  calcium carbonate Take 2 tablets by mouth daily. Daily   colchicine 0.6 MG tablet Take 0.6 mg by mouth. Take 1 tablet by mouth every 8 hours as needed for gout flare   dicyclomine 10 MG capsule Commonly known as:  BENTYL Take 10 mg by mouth daily.   esomeprazole 40 MG capsule Commonly known as:  NEXIUM Take one capsule by thirty minutes before breakfast Take one capsule by thirty minutes before dinner What changed:  how much to take  how to take this  when to take this  additional instructions   fluticasone 50 MCG/ACT nasal spray Commonly known as:  FLONASE Place 1 spray into both nostrils daily.   folic acid 194 MCG tablet Commonly known as:  FOLVITE Take 400 mcg by mouth daily.   furosemide 40 MG tablet Commonly known as:  LASIX Take 40 mg by mouth daily.   gabapentin 100 MG capsule Commonly known as:  NEURONTIN TAKE 1 CAPSULE BY MOUTH TWICE DAILY   lisinopril 20 MG  tablet Commonly known as:  PRINIVIL,ZESTRIL Take 20 mg by mouth daily.   loratadine 10 MG tablet Commonly known as:  CLARITIN Take 10 mg by mouth daily as needed. For allergies.   mycophenolate 500 MG tablet Commonly known as:  CELLCEPT TK 2 TS PO BID   omega-3 acid ethyl esters 1 g capsule Commonly known as:  LOVAZA Take 4 capsules by mouth daily.   oxyCODONE-acetaminophen 5-325 MG tablet Commonly known as:  PERCOCET/ROXICET Take 1 tablet by mouth every 6 (six) hours as needed for severe pain.   potassium chloride SA 20 MEQ tablet Commonly known as:  K-DUR,KLOR-CON Take 40 mEq by mouth daily.   predniSONE 5 MG tablet Commonly known as:  DELTASONE Take 5 mg by mouth daily. Taper as directed by physician FOR 30 DAYS   psyllium 95 % Pack Commonly known as:  HYDROCIL/METAMUCIL Take 1 packet by mouth daily.   sodium chloride 0.65 % nasal spray Commonly known as:  OCEAN Place 1 spray into the nose as needed. For allergies.   ULORIC 40 MG tablet Generic drug:  febuxostat Take 40 mg by mouth daily.   vitamin A 8000 UNIT capsule Take 8,000 Units by mouth daily.   vitamin B-12 1000 MCG tablet Commonly known as:  CYANOCOBALAMIN Take 1,000 mcg by mouth daily.   vitamin C 1000 MG tablet Take 1,000 mg by mouth daily.   VITAMIN D PO Take 1,200 mg by mouth daily.   vitamin E 600 UNIT capsule Take 600 Units by mouth daily.       Allergies  Allergen Reactions  . Codeine Other (See Comments)    Feels drunk  . Hydrocodone     "makes me sleepy"    Consultations:None   Procedures/Studies: Dg Ribs Unilateral W/chest Left  Result Date: 10/22/2016 CLINICAL DATA:  Generalized weakness, headache EXAM: LEFT RIBS AND CHEST - 3+ VIEW COMPARISON:  03/31/2009 FINDINGS: Elevation of the right hemidiaphragm, stable. Right Port-A-Cath in place with the tip in the SVC. Right base atelectasis. No pneumothorax. Left lung clear. Heart is normal size. No visible rib fracture or  pneumothorax. IMPRESSION: Elevated right hemidiaphragm with right base atelectasis. Right Port-A-Cath tip in the SVC.  No pneumothorax. Electronically Signed   By: Rolm Baptise M.D.   On: 10/22/2016 15:56   Ct Head Wo Contrast  Result Date: 10/22/2016 CLINICAL DATA:  Weakness and headache. EXAM: CT HEAD WITHOUT CONTRAST TECHNIQUE: Contiguous axial images were obtained from the base of the skull through the vertex without intravenous contrast. COMPARISON:  Brain MRI 04/22/2012 FINDINGS: Brain: No mass lesion, intraparenchymal hemorrhage or extra-axial collection. No evidence of acute cortical infarct. There is periventricular hypoattenuation compatible with chronic microvascular disease. Vascular: No hyperdense vessel or unexpected calcification. Skull: Normal visualized skull base, calvarium and extracranial soft tissues. Sinuses/Orbits: No sinus fluid levels or advanced mucosal thickening. No mastoid effusion. Normal orbits. IMPRESSION: Advanced chronic ischemic microangiopathy, unchanged from MRI of 04/22/2012. No acute abnormality. Electronically Signed   By: Ulyses Jarred M.D.   On: 10/22/2016 15:19   Ct Angio Chest Pe W/cm &/or Wo Cm  Result Date:  10/22/2016 CLINICAL DATA:  PE suspected, intermediate probability, positive D-dimer. Generalized weakness and headache. Symptoms began after receiving a flu shot. EXAM: CT ANGIOGRAPHY CHEST WITH CONTRAST TECHNIQUE: Multidetector CT imaging of the chest was performed using the standard protocol during bolus administration of intravenous contrast. Multiplanar CT image reconstructions and MIPs were obtained to evaluate the vascular anatomy. CONTRAST:  55 mL Isovue 370. COMPARISON:  CT head chest and rib radiographs of the same day. CT of the chest 06/16/2006 FINDINGS: Cardiovascular: The heart size is normal. Coronary artery calcifications are present. Minimal atherosclerotic changes are present in the aorta and great vessel origins without significant aneurysm  or stenosis. Pulmonary artery opacification is excellent. No focal filling defects are present to suggest pulmonary embolus. Mediastinum/Nodes: No significant mediastinal or axillary adenopathy is present. Esophagus is within normal limits. The right IJ Port-A-Cath is in place. The tip terminates just above the cavoatrial junction. Lungs/Pleura: Dependent atelectasis is present in the lungs bilaterally, right greater than left. There is minimal consolidation at the right lung base. The right hemidiaphragm is elevated. No significant pleural effusion is present. There is no pneumothorax. Upper Abdomen: A right upper pole renal cyst is stable. Limited imaging of the abdomen is otherwise unremarkable. Musculoskeletal: Vertebral body heights alignment are maintained. The ribs are within normal limits. Review of the MIP images confirms the above findings. IMPRESSION: 1. No evidence for pulmonary embolus. 2. Atherosclerosis including coronary artery disease. 3. Asymmetric right lower lobe atelectasis. 4. Stable right renal cyst. Electronically Signed   By: San Morelle M.D.   On: 10/22/2016 19:01    (Echo, Carotid, EGD, Colonoscopy, ERCP)    Subjective:   Discharge Exam: Vitals:   10/24/16 1008 10/24/16 1421  BP: 107/60 (!) 124/57  Pulse: 92 87  Resp:  16  Temp:  98.1 F (36.7 C)  SpO2:  96%   Vitals:   10/23/16 2048 10/24/16 0623 10/24/16 1008 10/24/16 1421  BP: 131/63 124/66 107/60 (!) 124/57  Pulse: 99 92 92 87  Resp: 18 16  16   Temp: 98.3 F (36.8 C) 98.8 F (37.1 C)  98.1 F (36.7 C)  TempSrc: Oral Oral  Oral  SpO2: 97% 97%  96%  Weight:      Height:        General: Pt is alert, awake, not in acute distress Cardiovascular: RRR, S1/S2 +, no rubs, no gallops Respiratory: CTA bilaterally, no wheezing, no rhonchi Abdominal: Soft, NT, ND, bowel sounds + Extremities: no edema, no cyanosis    The results of significant diagnostics from this hospitalization (including  imaging, microbiology, ancillary and laboratory) are listed below for reference.     Microbiology: Recent Results (from the past 240 hour(s))  Culture, blood (single)     Status: None (Preliminary result)   Collection Time: 10/22/16  8:08 PM  Result Value Ref Range Status   Specimen Description BLOOD LEFT ANTECUBITAL  Final   Special Requests   Final    IN PEDIATRIC BOTTLE Blood Culture results may not be optimal due to an excessive volume of blood received in culture bottles   Culture   Final    NO GROWTH 2 DAYS Performed at [facility], [place] [address], [place], [place] [address]    Report Status PENDING  Incomplete     Labs: BNP (last 3 results) No results for input(s): BNP in the last 8760 hours. Basic Metabolic Panel:  Recent Labs Lab 10/22/16 1359 10/23/16 0520 10/24/16 0351  NA 139 141 142  K 3.8 3.3* 4.2  CL 103 107 108  CO2 23 23 24   GLUCOSE 109* 87 94  BUN 24* 24* 22*  CREATININE 0.58 0.65 0.71  CALCIUM 9.5 9.0 9.2   Liver Function Tests:  Recent Labs Lab 10/22/16 1359  AST 31  ALT 28  ALKPHOS 50  BILITOT 0.5  PROT 6.5  ALBUMIN 3.7   No results for input(s): LIPASE, AMYLASE in the last 168 hours. No results for input(s): AMMONIA in the last 168 hours. CBC:  Recent Labs Lab 10/22/16 1359 10/23/16 0520 10/24/16 0351  WBC 4.8 5.9 6.2  NEUTROABS 3.3  --   --   HGB 11.1* 10.3* 9.6*  HCT 35.0* 31.8* 30.1*  MCV 83.1 82.6 83.4  PLT 384 360 350   Cardiac Enzymes:  Recent Labs Lab 10/22/16 1600 10/22/16 2008 10/24/16 0351  CKTOTAL  --  892* 698*  TROPONINI <0.03  --   --    BNP: Invalid input(s): POCBNP CBG: No results for input(s): GLUCAP in the last 168 hours. D-Dimer  Recent Labs  10/22/16 1359  DDIMER 1.77*   Hgb A1c No results for input(s): HGBA1C in the last 72 hours. Lipid Profile No results for input(s): CHOL, HDL, LDLCALC, TRIG, CHOLHDL, LDLDIRECT in the last 72 hours. Thyroid function studies  Recent Labs   10/22/16 2008  TSH 1.080   Anemia work up No results for input(s): VITAMINB12, FOLATE, FERRITIN, TIBC, IRON, RETICCTPCT in the last 72 hours. Urinalysis    Component Value Date/Time   COLORURINE YELLOW 10/22/2016 1753   APPEARANCEUR HAZY (A) 10/22/2016 1753   LABSPEC 1.017 10/22/2016 1753   PHURINE 5.0 10/22/2016 1753   GLUCOSEU NEGATIVE 10/22/2016 1753   HGBUR NEGATIVE 10/22/2016 1753   BILIRUBINUR NEGATIVE 10/22/2016 1753   KETONESUR NEGATIVE 10/22/2016 1753   PROTEINUR NEGATIVE 10/22/2016 1753   UROBILINOGEN 0.2 04/30/2013 1803   NITRITE NEGATIVE 10/22/2016 1753   LEUKOCYTESUR MODERATE (A) 10/22/2016 1753   Sepsis Labs Invalid input(s): PROCALCITONIN,  WBC,  LACTICIDVEN Microbiology Recent Results (from the past 240 hour(s))  Culture, blood (single)     Status: None (Preliminary result)   Collection Time: 10/22/16  8:08 PM  Result Value Ref Range Status   Specimen Description BLOOD LEFT ANTECUBITAL  Final   Special Requests   Final    IN PEDIATRIC BOTTLE Blood Culture results may not be optimal due to an excessive volume of blood received in culture bottles   Culture   Final    NO GROWTH 2 DAYS Performed at [facility], [place] [address], [place], [place] [address]    Report Status PENDING  Incomplete     Time coordinating discharge: Over 30 minutes  SIGNED:   Georgette Shell, MD  [facility] 10/24/2016, 3:00 PM  If 7PM-7AM, please contact night-coverage [email or web address] Password [email or web address]

## 2016-10-24 NOTE — Progress Notes (Deleted)
PROGRESS NOTE    [name] [name]  DZH:[id number] DOB: 1940-06-30 DOA: 10/22/2016 PCP: [name], MD   Brief Narrative: 76 y.o.femalewith medical history significant for hypertension, chronic diastolic CHF, polymyositis, and excessive daytime sleepiness, now presenting to the emergency department for evaluation of headache and malaise. Patient reports that she maintain her usual state of health until approximately one week ago when she noted the insidious development of mild headache and a general malaise. Symptoms have persisted largely unchanged for the past week, now prompting her to come into the ED. She denies any change in vision or hearing and denies any focal numbness or weakness. There has not been any chest pain, shortness of breath, or palpitations associated with this. No recent fall or trauma. Reports that she had a recent changes made to her gout medications, but otherwise no changes. Denies any alcohol use.  Admitted for hypothermia, sepsis.        Assessment & Plan:   Principal Problem:   Hypothermia Active Problems:   Essential hypertension   Polymyositis (HCC)   Anemia   Adrenal insufficiency (HCC)   Chronic diastolic CHF (congestive heart failure) ([place])  Hypothermia patient presented with temperature 93.3 Fahrenheit unknown etiology. CT of the head showed chronic ischemic microangiopathy. CT angiogram of the chest negative for PE. Normal TSH and normal cortisol level blood cultures are pending at this time. Currently his temperature is back to normal. Patient is awake alert and talking on the phone. . Hypertension on beta blocker and ACE inhibitor at this time decrease the dose of antihypertensives since his blood pressure is low to low-normal.  History of polymyositis on prednisone and CellCept as an outpatient continue.  Normocytic anemia stable.    DVT prophylaxis Lovenox Code Status: Full code Disposition Plan follow-up blood cultures if negative  plan discharge.   Consultants:  None Procedures: None  Antimicrobials:  None  Subjective feels okay  Objective: Vitals:   10/23/16 1358 10/23/16 2048 10/24/16 0623 10/24/16 1008  BP: 109/65 131/63 124/66 107/60  Pulse: 86 99 92 92  Resp: 18 18 16    Temp: 97.8 F (36.6 C) 98.3 F (36.8 C) 98.8 F (37.1 C)   TempSrc: Oral Oral Oral   SpO2: 100% 97% 97%   Weight:      Height:        Intake/Output Summary (Last 24 hours) at 10/24/16 1401 Last data filed at 10/24/16 1200  Gross per 24 hour  Intake                0 ml  Output                0 ml  Net                0 ml   Filed Weights   10/22/16 2333  Weight: 71.7 kg (158 lb 1.1 oz)    Examination:  General exam: Appears calm and comfortable  Respiratory system: Clear to auscultation. Respiratory effort normal. Cardiovascular system: S1 & S2 heard, RRR. No JVD, murmurs, rubs, gallops or clicks. No pedal edema. Gastrointestinal system: Abdomen is nondistended, soft and nontender. No organomegaly or masses felt. Normal bowel sounds heard. Central nervous system: Alert and oriented. No focal neurological deficits. Extremities: Symmetric 5 x 5 power. Skin: No rashes, lesions or ulcers Psychiatry: Judgement and insight appear normal. Mood & affect appropriate.     Data Reviewed: I have personally reviewed following labs and imaging studies  CBC:  Recent Labs Lab  10/22/16 1359 10/23/16 0520 10/24/16 0351  WBC 4.8 5.9 6.2  NEUTROABS 3.3  --   --   HGB 11.1* 10.3* 9.6*  HCT 35.0* 31.8* 30.1*  MCV 83.1 82.6 83.4  PLT 384 360 932   Basic Metabolic Panel:  Recent Labs Lab 10/22/16 1359 10/23/16 0520 10/24/16 0351  NA 139 141 142  K 3.8 3.3* 4.2  CL 103 107 108  CO2 23 23 24   GLUCOSE 109* 87 94  BUN 24* 24* 22*  CREATININE 0.58 0.65 0.71  CALCIUM 9.5 9.0 9.2   GFR: Estimated Creatinine Clearance: 55.9 mL/min (by C-G formula based on SCr of 0.71 mg/dL). Liver Function Tests:  Recent Labs Lab  10/22/16 1359  AST 31  ALT 28  ALKPHOS 50  BILITOT 0.5  PROT 6.5  ALBUMIN 3.7   No results for input(s): LIPASE, AMYLASE in the last 168 hours. No results for input(s): AMMONIA in the last 168 hours. Coagulation Profile: No results for input(s): INR, PROTIME in the last 168 hours. Cardiac Enzymes:  Recent Labs Lab 10/22/16 1600 10/22/16 2008 10/24/16 0351  CKTOTAL  --  892* 698*  TROPONINI <0.03  --   --    BNP (last 3 results) No results for input(s): PROBNP in the last 8760 hours. HbA1C: No results for input(s): HGBA1C in the last 72 hours. CBG: No results for input(s): GLUCAP in the last 168 hours. Lipid Profile: No results for input(s): CHOL, HDL, LDLCALC, TRIG, CHOLHDL, LDLDIRECT in the last 72 hours. Thyroid Function Tests:  Recent Labs  10/22/16 2008  TSH 1.080   Anemia Panel: No results for input(s): VITAMINB12, FOLATE, FERRITIN, TIBC, IRON, RETICCTPCT in the last 72 hours. Sepsis Labs:  Recent Labs Lab 10/22/16 1407  LATICACIDVEN 1.09    Recent Results (from the past 240 hour(s))  Culture, blood (single)     Status: None (Preliminary result)   Collection Time: 10/22/16  8:08 PM  Result Value Ref Range Status   Specimen Description BLOOD LEFT ANTECUBITAL  Final   Special Requests   Final    IN PEDIATRIC BOTTLE Blood Culture results may not be optimal due to an excessive volume of blood received in culture bottles   Culture   Final    NO GROWTH < 24 HOURS Performed at [facility] [address] [place], [place] [address]    Report Status PENDING  Incomplete         Radiology Studies: Dg Ribs Unilateral W/chest Left  Result Date: 10/22/2016 CLINICAL DATA:  Generalized weakness, headache EXAM: LEFT RIBS AND CHEST - 3+ VIEW COMPARISON:  03/31/2009 FINDINGS: Elevation of the right hemidiaphragm, stable. Right Port-A-Cath in place with the tip in the SVC. Right base atelectasis. No pneumothorax. Left lung clear. Heart is normal size. No  visible rib fracture or pneumothorax. IMPRESSION: Elevated right hemidiaphragm with right base atelectasis. Right Port-A-Cath tip in the SVC.  No pneumothorax. Electronically Signed   By: Rolm Baptise M.D.   On: 10/22/2016 15:56   Ct Head Wo Contrast  Result Date: 10/22/2016 CLINICAL DATA:  Weakness and headache. EXAM: CT HEAD WITHOUT CONTRAST TECHNIQUE: Contiguous axial images were obtained from the base of the skull through the vertex without intravenous contrast. COMPARISON:  Brain MRI 04/22/2012 FINDINGS: Brain: No mass lesion, intraparenchymal hemorrhage or extra-axial collection. No evidence of acute cortical infarct. There is periventricular hypoattenuation compatible with chronic microvascular disease. Vascular: No hyperdense vessel or unexpected calcification. Skull: Normal visualized skull base, calvarium and extracranial soft  tissues. Sinuses/Orbits: No sinus fluid levels or advanced mucosal thickening. No mastoid effusion. Normal orbits. IMPRESSION: Advanced chronic ischemic microangiopathy, unchanged from MRI of 04/22/2012. No acute abnormality. Electronically Signed   By: Ulyses Jarred M.D.   On: 10/22/2016 15:19   Ct Angio Chest Pe W/cm &/or Wo Cm  Result Date: 10/22/2016 CLINICAL DATA:  PE suspected, intermediate probability, positive D-dimer. Generalized weakness and headache. Symptoms began after receiving a flu shot. EXAM: CT ANGIOGRAPHY CHEST WITH CONTRAST TECHNIQUE: Multidetector CT imaging of the chest was performed using the standard protocol during bolus administration of intravenous contrast. Multiplanar CT image reconstructions and MIPs were obtained to evaluate the vascular anatomy. CONTRAST:  55 mL Isovue 370. COMPARISON:  CT head chest and rib radiographs of the same day. CT of the chest 06/16/2006 FINDINGS: Cardiovascular: The heart size is normal. Coronary artery calcifications are present. Minimal atherosclerotic changes are present in the aorta and great vessel origins  without significant aneurysm or stenosis. Pulmonary artery opacification is excellent. No focal filling defects are present to suggest pulmonary embolus. Mediastinum/Nodes: No significant mediastinal or axillary adenopathy is present. Esophagus is within normal limits. The right IJ Port-A-Cath is in place. The tip terminates just above the cavoatrial junction. Lungs/Pleura: Dependent atelectasis is present in the lungs bilaterally, right greater than left. There is minimal consolidation at the right lung base. The right hemidiaphragm is elevated. No significant pleural effusion is present. There is no pneumothorax. Upper Abdomen: A right upper pole renal cyst is stable. Limited imaging of the abdomen is otherwise unremarkable. Musculoskeletal: Vertebral body heights alignment are maintained. The ribs are within normal limits. Review of the MIP images confirms the above findings. IMPRESSION: 1. No evidence for pulmonary embolus. 2. Atherosclerosis including coronary artery disease. 3. Asymmetric right lower lobe atelectasis. 4. Stable right renal cyst. Electronically Signed   By: San Morelle M.D.   On: 10/22/2016 19:01        Scheduled Meds: . allopurinol  300 mg Oral Daily  . atenolol  50 mg Oral Daily  . cholecalciferol  1,000 Units Oral Daily  . dicyclomine  10 mg Oral Daily  . enoxaparin (LOVENOX) injection  40 mg Subcutaneous QHS  . feeding supplement (ENSURE ENLIVE)  237 mL Oral BID BM  . fluticasone  1 spray Each Nare Daily  . folic acid  8,841 mcg Oral Daily  . gabapentin  100 mg Oral BID  . lisinopril  20 mg Oral Daily  . mycophenolate  1,000 mg Oral BID  . omega-3 acid ethyl esters  2 g Oral Daily  . pantoprazole  40 mg Oral Daily  . predniSONE  5 mg Oral Daily  . sodium chloride flush  3 mL Intravenous Q12H  . sodium chloride flush  3 mL Intravenous Q12H  . vitamin B-12  1,000 mcg Oral Daily   Continuous Infusions: . sodium chloride       LOS: 0 days     Georgette Shell, MD [facility]  If 7PM-7AM, please contact night-coverage [email or web address] Password [email or web address] 10/24/2016, 2:01 PM

## 2016-10-24 NOTE — Progress Notes (Signed)
Pt discharged with no needs at present time.  Pt states that she have [place].

## 2016-10-27 LAB — CULTURE, BLOOD (SINGLE): Culture: NO GROWTH

## 2016-11-12 ENCOUNTER — Other Ambulatory Visit: Payer: Self-pay | Admitting: Podiatry

## 2016-11-14 ENCOUNTER — Other Ambulatory Visit (HOSPITAL_COMMUNITY): Payer: Self-pay

## 2016-11-17 ENCOUNTER — Inpatient Hospital Stay (HOSPITAL_COMMUNITY): Admission: RE | Admit: 2016-11-17 | Payer: Commercial Managed Care - HMO | Source: Ambulatory Visit

## 2016-11-18 ENCOUNTER — Ambulatory Visit: Payer: Medicare Other | Attending: Internal Medicine

## 2016-11-18 ENCOUNTER — Ambulatory Visit: Payer: Medicare Other | Admitting: Physical Therapy

## 2016-11-18 ENCOUNTER — Other Ambulatory Visit: Payer: Self-pay | Admitting: Podiatry

## 2016-11-21 ENCOUNTER — Encounter (HOSPITAL_COMMUNITY)
Admission: RE | Admit: 2016-11-21 | Discharge: 2016-11-21 | Disposition: A | Discharge status: Home / Self Care | Payer: Medicare Other | Source: Ambulatory Visit | Attending: Rheumatology | Admitting: Rheumatology

## 2016-11-21 DIAGNOSIS — M810 Age-related osteoporosis without current pathological fracture: Secondary | ICD-10-CM | POA: Diagnosis present

## 2016-11-21 MED ORDER — HEPARIN SOD (PORK) LOCK FLUSH 100 UNIT/ML IV SOLN
INTRAVENOUS | Status: AC
  Administered 2016-11-21: 500 [IU]
  Filled 2016-11-21: qty 5

## 2016-11-28 ENCOUNTER — Other Ambulatory Visit: Payer: Self-pay | Admitting: Podiatry

## 2016-12-11 ENCOUNTER — Other Ambulatory Visit: Payer: Self-pay | Admitting: Podiatry

## 2016-12-15 ENCOUNTER — Encounter (HOSPITAL_COMMUNITY)
Admission: RE | Admit: 2016-12-15 | Discharge: 2016-12-15 | Disposition: A | Discharge status: Home / Self Care | Payer: Medicare Other | Source: Ambulatory Visit | Attending: Rheumatology | Admitting: Rheumatology

## 2016-12-15 DIAGNOSIS — M810 Age-related osteoporosis without current pathological fracture: Secondary | ICD-10-CM | POA: Diagnosis not present

## 2016-12-15 MED ORDER — HEPARIN SOD (PORK) LOCK FLUSH 100 UNIT/ML IV SOLN
500.0000 [IU] | INTRAVENOUS | Status: DC | PRN
  Administered 2016-12-15: 500 [IU]

## 2016-12-15 MED ORDER — HEPARIN SOD (PORK) LOCK FLUSH 100 UNIT/ML IV SOLN: 500.0000 [IU] | INTRAVENOUS | Status: DC | PRN

## 2016-12-15 MED ORDER — HEPARIN SOD (PORK) LOCK FLUSH 100 UNIT/ML IV SOLN
INTRAVENOUS | Status: AC
  Administered 2016-12-15: 11:00:00 500 [IU]
  Filled 2016-12-15: qty 5

## 2016-12-16 ENCOUNTER — Other Ambulatory Visit: Payer: Self-pay | Admitting: Nephrology

## 2016-12-16 DIAGNOSIS — R809 Proteinuria, unspecified: Secondary | ICD-10-CM

## 2016-12-20 ENCOUNTER — Other Ambulatory Visit: Payer: Self-pay | Admitting: Podiatry

## 2016-12-29 ENCOUNTER — Other Ambulatory Visit: Payer: Medicare Other

## 2017-01-02 ENCOUNTER — Other Ambulatory Visit: Payer: Self-pay

## 2017-01-02 ENCOUNTER — Emergency Department (HOSPITAL_COMMUNITY): Payer: Medicare Other

## 2017-01-02 ENCOUNTER — Ambulatory Visit
Admission: RE | Admit: 2017-01-02 | Discharge: 2017-01-02 | Disposition: A | Discharge status: Home / Self Care | Payer: Medicare Other | Source: Ambulatory Visit | Attending: Nephrology | Admitting: Nephrology

## 2017-01-02 ENCOUNTER — Emergency Department (HOSPITAL_COMMUNITY)
Admission: EM | Admit: 2017-01-02 | Discharge: 2017-01-02 | Discharge status: Against Medical Advice | Payer: Medicare Other | Attending: Emergency Medicine | Admitting: Emergency Medicine

## 2017-01-02 ENCOUNTER — Encounter (HOSPITAL_COMMUNITY): Payer: Self-pay | Admitting: Emergency Medicine

## 2017-01-02 DIAGNOSIS — R809 Proteinuria, unspecified: Secondary | ICD-10-CM

## 2017-01-02 DIAGNOSIS — Z5321 Procedure and treatment not carried out due to patient leaving prior to being seen by health care provider: Secondary | ICD-10-CM | POA: Diagnosis not present

## 2017-01-02 DIAGNOSIS — R079 Chest pain, unspecified: Secondary | ICD-10-CM | POA: Insufficient documentation

## 2017-01-02 LAB — CBC
HCT: 31.7 % — ABNORMAL LOW (ref 36.0–46.0)
Hemoglobin: 9.9 g/dL — ABNORMAL LOW (ref 12.0–15.0)
MCH: 25.9 pg — ABNORMAL LOW (ref 26.0–34.0)
MCHC: 31.2 g/dL (ref 30.0–36.0)
MCV: 83 fL (ref 78.0–100.0)
PLATELETS: 233 10*3/uL (ref 150–400)
RBC: 3.82 MIL/uL — ABNORMAL LOW (ref 3.87–5.11)
RDW: 18 % — AB (ref 11.5–15.5)
WBC: 6.9 10*3/uL (ref 4.0–10.5)

## 2017-01-02 LAB — BASIC METABOLIC PANEL
Anion gap: 10 (ref 5–15)
BUN: 13 mg/dL (ref 6–20)
CALCIUM: 9.4 mg/dL (ref 8.9–10.3)
CO2: 23 mmol/L (ref 22–32)
Chloride: 106 mmol/L (ref 101–111)
Creatinine, Ser: 0.53 mg/dL (ref 0.44–1.00)
GFR calc Af Amer: >60 mL/min (ref >60)
GLUCOSE: 91 mg/dL (ref 65–99)
Potassium: 4.3 mmol/L (ref 3.5–5.1)
Sodium: 139 mmol/L (ref 135–145)

## 2017-01-02 LAB — TROPONIN I

## 2017-01-02 NOTE — ED Provider Notes (Signed)
Patient's EMRand results had been reviewed, upon looking for patient, not in room. Nurse note now indicates patient LWBS.    Charlesetta Shanks, MD 01/02/17 [phone]

## 2017-01-02 NOTE — ED Triage Notes (Signed)
Pt complaint on and off chest pressure for 2 months; worse at night and with bending over.

## 2017-01-02 NOTE — ED Notes (Signed)
Patient states her bus is leaving and must go home now. Patient states she will come back to [facility] on Sunday. Patient signed AMA form.

## 2017-01-02 NOTE — ED Notes (Signed)
Pt taken back to room 5. Pt states she has a bus on the way to get her, she would like her results and be discharged

## 2017-01-17 ENCOUNTER — Other Ambulatory Visit: Payer: Self-pay | Admitting: Podiatry

## 2017-01-19 ENCOUNTER — Inpatient Hospital Stay (HOSPITAL_COMMUNITY): Admission: RE | Admit: 2017-01-19 | Payer: Medicare Other | Source: Ambulatory Visit

## 2017-01-22 ENCOUNTER — Other Ambulatory Visit: Payer: Self-pay | Admitting: Podiatry

## 2017-01-26 ENCOUNTER — Inpatient Hospital Stay (HOSPITAL_COMMUNITY): Admission: RE | Admit: 2017-01-26 | Payer: Medicare Other | Source: Ambulatory Visit

## 2017-02-06 ENCOUNTER — Encounter (HOSPITAL_COMMUNITY)
Admission: RE | Admit: 2017-02-06 | Discharge: 2017-02-06 | Disposition: A | Discharge status: Home / Self Care | Payer: Medicare Other | Source: Ambulatory Visit | Attending: Rheumatology | Admitting: Rheumatology

## 2017-02-06 DIAGNOSIS — M810 Age-related osteoporosis without current pathological fracture: Secondary | ICD-10-CM | POA: Insufficient documentation

## 2017-02-06 MED ORDER — HEPARIN SOD (PORK) LOCK FLUSH 100 UNIT/ML IV SOLN
INTRAVENOUS | Status: AC
  Filled 2017-02-06: qty 5

## 2017-02-06 MED ORDER — HEPARIN SOD (PORK) LOCK FLUSH 100 UNIT/ML IV SOLN
500.0000 [IU] | INTRAVENOUS | Status: AC | PRN
  Administered 2017-02-06: 500 [IU]

## 2017-02-21 ENCOUNTER — Other Ambulatory Visit: Payer: Self-pay | Admitting: Neurosurgery

## 2017-02-21 DIAGNOSIS — R29898 Other symptoms and signs involving the musculoskeletal system: Secondary | ICD-10-CM

## 2017-02-24 ENCOUNTER — Ambulatory Visit: Payer: Medicare Other | Admitting: Gastroenterology

## 2017-03-06 ENCOUNTER — Other Ambulatory Visit: Payer: Medicare Other

## 2017-03-09 ENCOUNTER — Encounter (HOSPITAL_COMMUNITY)
Admission: RE | Admit: 2017-03-09 | Discharge: 2017-03-09 | Disposition: A | Discharge status: Home / Self Care | Payer: Medicare Other | Source: Ambulatory Visit | Attending: Rheumatology | Admitting: Rheumatology

## 2017-03-09 DIAGNOSIS — M810 Age-related osteoporosis without current pathological fracture: Secondary | ICD-10-CM | POA: Diagnosis not present

## 2017-03-09 MED ORDER — HEPARIN SOD (PORK) LOCK FLUSH 100 UNIT/ML IV SOLN
INTRAVENOUS | Status: AC
  Administered 2017-03-09: 11:00:00 500 [IU]
  Filled 2017-03-09: qty 5

## 2017-03-09 MED ORDER — HEPARIN SOD (PORK) LOCK FLUSH 100 UNIT/ML IV SOLN: 500.0000 [IU] | INTRAVENOUS | Status: DC | PRN

## 2017-03-09 MED ORDER — HEPARIN SOD (PORK) LOCK FLUSH 100 UNIT/ML IV SOLN: 500.0000 [IU] | INTRAVENOUS | Status: DC

## 2017-03-23 ENCOUNTER — Ambulatory Visit
Admission: RE | Admit: 2017-03-23 | Discharge: 2017-03-23 | Disposition: A | Discharge status: Home / Self Care | Payer: Medicare Other | Source: Ambulatory Visit | Attending: Neurosurgery | Admitting: Neurosurgery

## 2017-03-23 DIAGNOSIS — R29898 Other symptoms and signs involving the musculoskeletal system: Secondary | ICD-10-CM

## 2017-04-01 ENCOUNTER — Other Ambulatory Visit: Payer: Self-pay | Admitting: Podiatry

## 2017-04-03 ENCOUNTER — Other Ambulatory Visit (HOSPITAL_COMMUNITY): Payer: Self-pay

## 2017-04-06 ENCOUNTER — Encounter (HOSPITAL_COMMUNITY)
Admission: RE | Admit: 2017-04-06 | Discharge: 2017-04-06 | Disposition: A | Discharge status: Home / Self Care | Payer: Medicare Other | Source: Ambulatory Visit | Attending: Rheumatology | Admitting: Rheumatology

## 2017-04-06 DIAGNOSIS — M810 Age-related osteoporosis without current pathological fracture: Secondary | ICD-10-CM | POA: Insufficient documentation

## 2017-04-06 LAB — CBC WITH DIFFERENTIAL/PLATELET
Basophils Absolute: 0 10*3/uL (ref 0.0–0.1)
Basophils Relative: 0 %
EOS ABS: 0.2 10*3/uL (ref 0.0–0.7)
EOS PCT: 3 %
HCT: 32.4 % — ABNORMAL LOW (ref 36.0–46.0)
Hemoglobin: 10 g/dL — ABNORMAL LOW (ref 12.0–15.0)
Lymphocytes Relative: 35 %
Lymphs Abs: 2.3 10*3/uL (ref 0.7–4.0)
MCH: 25.8 pg — ABNORMAL LOW (ref 26.0–34.0)
MCHC: 30.9 g/dL (ref 30.0–36.0)
MCV: 83.7 fL (ref 78.0–100.0)
Monocytes Absolute: 0.3 10*3/uL (ref 0.1–1.0)
Monocytes Relative: 5 %
Neutro Abs: 3.6 10*3/uL (ref 1.7–7.7)
Neutrophils Relative %: 57 %
PLATELETS: 288 10*3/uL (ref 150–400)
RBC: 3.87 MIL/uL (ref 3.87–5.11)
RDW: 18.2 % — ABNORMAL HIGH (ref 11.5–15.5)
WBC: 6.4 10*3/uL (ref 4.0–10.5)

## 2017-04-06 LAB — COMPREHENSIVE METABOLIC PANEL
ALBUMIN: 3.7 g/dL (ref 3.5–5.0)
ALT: 19 U/L (ref 14–54)
AST: 29 U/L (ref 15–41)
Alkaline Phosphatase: 51 U/L (ref 38–126)
Anion gap: 9 (ref 5–15)
BUN: 18 mg/dL (ref 6–20)
CHLORIDE: 108 mmol/L (ref 101–111)
CO2: 25 mmol/L (ref 22–32)
CREATININE: 0.62 mg/dL (ref 0.44–1.00)
Calcium: 9.4 mg/dL (ref 8.9–10.3)
GFR calc non Af Amer: >60 mL/min (ref >60)
GLUCOSE: 88 mg/dL (ref 65–99)
Potassium: 3.4 mmol/L — ABNORMAL LOW (ref 3.5–5.1)
SODIUM: 142 mmol/L (ref 135–145)
Total Bilirubin: 0.5 mg/dL (ref 0.3–1.2)
Total Protein: 6.1 g/dL — ABNORMAL LOW (ref 6.5–8.1)

## 2017-04-06 MED ORDER — HEPARIN SOD (PORK) LOCK FLUSH 100 UNIT/ML IV SOLN
500.0000 [IU] | INTRAVENOUS | Status: DC | PRN
  Administered 2017-04-06: 500 [IU]

## 2017-04-06 MED ORDER — HEPARIN SOD (PORK) LOCK FLUSH 100 UNIT/ML IV SOLN: 500.0000 [IU] | INTRAVENOUS | Status: DC

## 2017-04-06 MED ORDER — HEPARIN SOD (PORK) LOCK FLUSH 100 UNIT/ML IV SOLN
INTRAVENOUS | Status: AC
  Administered 2017-04-06: 11:00:00 500 [IU]
  Filled 2017-04-06: qty 5

## 2017-04-14 ENCOUNTER — Other Ambulatory Visit: Payer: Self-pay | Admitting: Podiatry

## 2017-05-04 ENCOUNTER — Ambulatory Visit (HOSPITAL_COMMUNITY)
Admission: RE | Admit: 2017-05-04 | Discharge: 2017-05-04 | Disposition: A | Discharge status: Home / Self Care | Payer: Medicare Other | Source: Ambulatory Visit | Attending: Internal Medicine | Admitting: Internal Medicine

## 2017-05-04 DIAGNOSIS — Z452 Encounter for adjustment and management of vascular access device: Secondary | ICD-10-CM | POA: Insufficient documentation

## 2017-05-04 MED ORDER — HEPARIN SOD (PORK) LOCK FLUSH 100 UNIT/ML IV SOLN
INTRAVENOUS | Status: AC
  Administered 2017-05-04: 11:00:00 500 [IU] via INTRAVENOUS
  Filled 2017-05-04: qty 5

## 2017-05-13 DIAGNOSIS — I639 Cerebral infarction, unspecified: Secondary | ICD-10-CM

## 2017-05-13 HISTORY — DX: Cerebral infarction, unspecified: I63.9

## 2017-05-14 ENCOUNTER — Telehealth: Payer: Self-pay | Admitting: Gastroenterology

## 2017-05-14 NOTE — Telephone Encounter (Signed)
The pt has not been seen since 2017, appt has been made for July and the pt will follow up with PCP in the meantime.

## 2017-05-15 ENCOUNTER — Other Ambulatory Visit: Payer: Self-pay

## 2017-05-15 ENCOUNTER — Encounter (HOSPITAL_COMMUNITY): Payer: Self-pay

## 2017-05-15 ENCOUNTER — Emergency Department (HOSPITAL_COMMUNITY): Payer: Medicare Other

## 2017-05-15 ENCOUNTER — Emergency Department (HOSPITAL_COMMUNITY)
Admission: EM | Admit: 2017-05-15 | Discharge: 2017-05-15 | Disposition: A | Discharge status: Home / Self Care | Payer: Medicare Other | Attending: Emergency Medicine | Admitting: Emergency Medicine

## 2017-05-15 DIAGNOSIS — K5792 Diverticulitis of intestine, part unspecified, without perforation or abscess without bleeding: Secondary | ICD-10-CM | POA: Diagnosis not present

## 2017-05-15 DIAGNOSIS — I5032 Chronic diastolic (congestive) heart failure: Secondary | ICD-10-CM | POA: Insufficient documentation

## 2017-05-15 DIAGNOSIS — I110 Hypertensive heart disease with heart failure: Secondary | ICD-10-CM | POA: Diagnosis not present

## 2017-05-15 DIAGNOSIS — E876 Hypokalemia: Secondary | ICD-10-CM

## 2017-05-15 DIAGNOSIS — R109 Unspecified abdominal pain: Secondary | ICD-10-CM | POA: Diagnosis present

## 2017-05-15 DIAGNOSIS — Z79899 Other long term (current) drug therapy: Secondary | ICD-10-CM | POA: Diagnosis not present

## 2017-05-15 LAB — URINALYSIS, ROUTINE W REFLEX MICROSCOPIC
BILIRUBIN URINE: NEGATIVE
Bacteria, UA: NONE SEEN
Glucose, UA: NEGATIVE mg/dL
Ketones, ur: NEGATIVE mg/dL
LEUKOCYTES UA: NEGATIVE
NITRITE: NEGATIVE
PROTEIN: 30 mg/dL — AB
Specific Gravity, Urine: 1.033 — ABNORMAL HIGH (ref 1.005–1.030)
pH: 5 (ref 5.0–8.0)

## 2017-05-15 LAB — COMPREHENSIVE METABOLIC PANEL
ALK PHOS: 51 U/L (ref 38–126)
ALT: 17 U/L (ref 14–54)
ANION GAP: 12 (ref 5–15)
AST: 27 U/L (ref 15–41)
Albumin: 3.3 g/dL — ABNORMAL LOW (ref 3.5–5.0)
BUN: 11 mg/dL (ref 6–20)
CALCIUM: 8.6 mg/dL — AB (ref 8.9–10.3)
CO2: 25 mmol/L (ref 22–32)
Chloride: 108 mmol/L (ref 101–111)
Creatinine, Ser: 0.39 mg/dL — ABNORMAL LOW (ref 0.44–1.00)
Glucose, Bld: 88 mg/dL (ref 65–99)
Potassium: 2.4 mmol/L — CL (ref 3.5–5.1)
SODIUM: 145 mmol/L (ref 135–145)
TOTAL PROTEIN: 6.4 g/dL — AB (ref 6.5–8.1)
Total Bilirubin: 0.8 mg/dL (ref 0.3–1.2)

## 2017-05-15 LAB — CBC
HCT: 29.3 % — ABNORMAL LOW (ref 36.0–46.0)
HEMOGLOBIN: 9.2 g/dL — AB (ref 12.0–15.0)
MCH: 26 pg (ref 26.0–34.0)
MCHC: 31.4 g/dL (ref 30.0–36.0)
MCV: 82.8 fL (ref 78.0–100.0)
PLATELETS: 287 10*3/uL (ref 150–400)
RBC: 3.54 MIL/uL — AB (ref 3.87–5.11)
RDW: 17.4 % — ABNORMAL HIGH (ref 11.5–15.5)
WBC: 7 10*3/uL (ref 4.0–10.5)

## 2017-05-15 LAB — LIPASE, BLOOD: Lipase: 33 U/L (ref 11–51)

## 2017-05-15 MED ORDER — METRONIDAZOLE 500 MG PO TABS: 500.0000 mg | ORAL_TABLET | Freq: Two times a day (BID) | ORAL | 0 refills | Status: DC

## 2017-05-15 MED ORDER — IOPAMIDOL (ISOVUE-300) INJECTION 61%
INTRAVENOUS | Status: AC
  Administered 2017-05-15: 15:00:00
  Filled 2017-05-15: qty 100

## 2017-05-15 MED ORDER — CIPROFLOXACIN HCL 500 MG PO TABS: 500.0000 mg | ORAL_TABLET | Freq: Two times a day (BID) | ORAL | 0 refills | Status: DC

## 2017-05-15 MED ORDER — METRONIDAZOLE 500 MG PO TABS
500.0000 mg | ORAL_TABLET | Freq: Once | ORAL | Status: AC
  Administered 2017-05-15: 500 mg via ORAL
  Filled 2017-05-15: qty 1

## 2017-05-15 MED ORDER — CIPROFLOXACIN HCL 500 MG PO TABS
500.0000 mg | ORAL_TABLET | Freq: Once | ORAL | Status: AC
Start: 2017-05-15 — End: 2017-05-15
  Administered 2017-05-15: 500 mg via ORAL
  Filled 2017-05-15: qty 1

## 2017-05-15 MED ORDER — POTASSIUM CHLORIDE CRYS ER 20 MEQ PO TBCR
60.0000 meq | EXTENDED_RELEASE_TABLET | Freq: Once | ORAL | Status: AC
  Administered 2017-05-15: 60 meq via ORAL
  Filled 2017-05-15: qty 3

## 2017-05-15 MED ORDER — HEPARIN SOD (PORK) LOCK FLUSH 100 UNIT/ML IV SOLN
500.0000 [IU] | Freq: Once | INTRAVENOUS | Status: AC
Start: 2017-05-15 — End: 2017-05-15
  Administered 2017-05-15: 500 [IU]
  Filled 2017-05-15: qty 5

## 2017-05-15 MED ORDER — ACETAMINOPHEN ER 650 MG PO TBCR: 650.0000 mg | EXTENDED_RELEASE_TABLET | Freq: Three times a day (TID) | ORAL | 0 refills | Status: DC | PRN

## 2017-05-15 MED ORDER — IOPAMIDOL (ISOVUE-300) INJECTION 61%
100.0000 mL | Freq: Once | INTRAVENOUS | Status: AC | PRN
  Administered 2017-05-15: 100 mL via INTRAVENOUS

## 2017-05-15 MED ORDER — SODIUM CHLORIDE 0.9 % IJ SOLN
INTRAMUSCULAR | Status: AC
  Administered 2017-05-15: 15:00:00
  Filled 2017-05-15: qty 50

## 2017-05-15 MED ORDER — POTASSIUM CHLORIDE CRYS ER 20 MEQ PO TBCR: 40.0000 meq | EXTENDED_RELEASE_TABLET | Freq: Two times a day (BID) | ORAL | 0 refills | Status: DC

## 2017-05-15 MED ORDER — FAMOTIDINE IN NACL 20-0.9 MG/50ML-% IV SOLN
20.0000 mg | Freq: Once | INTRAVENOUS | Status: AC
  Administered 2017-05-15: 20 mg via INTRAVENOUS
  Filled 2017-05-15: qty 50

## 2017-05-15 NOTE — Discharge Instructions (Addendum)
Results in the ER show that you have a condition called diverticulitis. Please read the instructions provided.  Basically you have an infectious cause of your colon that we will get better with antibiotics.  Return to the ER if your pain gets worse or you start having bloody stools.

## 2017-05-15 NOTE — ED Notes (Signed)
Pt is unable to sign for discharge due to weakness.

## 2017-05-15 NOTE — ED Triage Notes (Signed)
Patient c/o left upper and mid abdominal pain x 4 days. Patient states she has been taking a generic OTC Pepto Bismol.

## 2017-05-15 NOTE — ED Notes (Signed)
Pt placed on cardiac monitoring due to potassium level

## 2017-05-15 NOTE — ED Provider Notes (Signed)
[facility] DEPT Provider Note   CSN: [id number] Arrival date & time: 05/15/17  0945     History   Chief Complaint Chief Complaint  Patient presents with  . Abdominal Pain    HPI [name] [name] is a 77 y.o. female.  HPI 77 year old female comes in with chief complaint of abdominal pain.  Patient has history of polymyositis, GERD and she started having abdominal pain about 4 days ago.  Pain is primarily located on the left side and is moving towards the stomach.  Patient denies any associated nausea or vomiting.  Patient also denies any bloody stools or diarrhea, fevers or chills.  No history of similar pain in the past.  Patient is on chronic steroids, but she states that her symptoms are not similar to GERD.  Past Medical History:  Diagnosis Date  . Allergic rhinitis, cause unspecified   . Benign neoplasm of colon   . Esophageal reflux   . Excessive daytime sleepiness   . 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 GI hemorrhage 2011   recurrent  . Hyperlipidemia   . Osteoporosis   . Polymyositis ([place])   . Unspecified essential hypertension   . Vitamin D deficiency     Patient Active Problem List   Diagnosis Date Noted  . Hypothermia 10/22/2016  . Chronic diastolic CHF (congestive heart failure) ([place]) 10/22/2016  . Abdominal pain, unspecified site 05/17/2013  . Nausea alone 05/17/2013  . 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 Abdominal pain, left lower quadrant 05/28/2011  . Cecal ulcer with hemorrhage 01/10/2011  . Acute blood loss anemia 12/06/2010  . Hematochezia 12/05/2010  . Anemia 12/05/2010  . Adrenal insufficiency ([place]) 12/05/2010  . Osteoporosis 12/05/2010  . LUNG INVOLVEMENT OTHER DISEASES CLASSIFIED ELSW 08/13/2009  . OTHER SPECIFIED DISORDER OF STOMACH AND DUODENUM 07/06/2008  . Essential hypertension 04/26/2007  . ALLERGIC RHINITIS 04/26/2007  . GASTROESOPHAGEAL REFLUX DISEASE  04/26/2007  . Polymyositis ([place]) 04/26/2007  . COLONIC POLYPS, ADENOMATOUS 02/06/2005    Past Surgical History:  Procedure Laterality Date  . ABDOMINAL HYSTERECTOMY     partial  . bilateral  rotator cuff surgery    . COLONOSCOPY  01/09/2011   others also  . COLONOSCOPY  05/29/2011   Procedure: COLONOSCOPY;  Surgeon: Gatha Mayer, MD;  Location: [facility] ENDOSCOPY;  Service: Endoscopy;  Laterality: N/A;  Greggory Brandy Carlean Purl  . COLONOSCOPY WITH PROPOFOL N/A 02/16/2014   Procedure: COLONOSCOPY WITH PROPOFOL;  Surgeon: Milus Banister, MD;  Location: [facility] ENDOSCOPY;  Service: Endoscopy;  Laterality: N/A;  . ESOPHAGOGASTRODUODENOSCOPY  12/08/2010   others also  . ESOPHAGOGASTRODUODENOSCOPY (EGD) WITH PROPOFOL N/A 02/16/2014   Procedure: ESOPHAGOGASTRODUODENOSCOPY (EGD) WITH PROPOFOL;  Surgeon: Milus Banister, MD;  Location: [facility] ENDOSCOPY;  Service: Endoscopy;  Laterality: N/A;  . EUS    . HOT HEMOSTASIS  05/29/2011   Procedure: HOT HEMOSTASIS (ARGON PLASMA COAGULATION/BICAP);  Surgeon: Gatha Mayer, MD;  Location: [facility];  Service: Endoscopy;  Laterality: N/A;  . LUMBAR LAMINECTOMY    . TONSILLECTOMY  age 27     OB History   None      Home Medications    Prior to Admission medications   Medication Sig Start Date End Date Taking? Authorizing Provider  allopurinol (ZYLOPRIM) 300 MG tablet Take 300 mg by mouth daily. 10/11/16  Yes [provider]  ALPRAZolam (XANAX) 0.25 MG tablet Take 0.25 mg by mouth 2 (two) times daily as needed for  anxiety or sleep.    Yes [provider]  Ascorbic Acid (VITAMIN C) 1000 MG tablet Take 1,000 mg by mouth daily.    Yes [provider]  atenolol (TENORMIN) 50 MG tablet Take 50 mg by mouth daily.     Yes [provider]  Calcium Carbonate (CALTRATE 600) 1500 MG TABS Take 2 tablets by mouth daily.    Yes [provider]  CARAFATE 1 GM/10ML suspension Take 1 g by mouth 2 (two) times daily as needed (GAS/INDIGESTION).   11/25/16  Yes [provider]  Cholecalciferol (VITAMIN D PO) Take 2 tablets by mouth daily.    Yes [provider]  colchicine 0.6 MG tablet Take 0.6 mg by mouth. Take 1 tablet by mouth every 8 hours as needed for gout flare 10/13/16  Yes [provider]  dicyclomine (BENTYL) 10 MG capsule Take 10 mg by mouth daily. 07/22/16  Yes [provider]  esomeprazole (NEXIUM) 40 MG capsule Take one capsule by thirty minutes before breakfast Take one capsule by thirty minutes before dinner Patient taking differently: Take 40 mg by mouth 2 (two) times daily before a meal. Take one capsule by thirty minutes before breakfast Take one capsule by thirty minutes before dinner 05/11/13  Yes Zehr, Laban Emperor, PA-C  folic acid (FOLVITE) 387 MCG tablet Take 400 mcg by mouth daily.     Yes [provider]  furosemide (LASIX) 40 MG tablet Take 40 mg by mouth daily.     Yes [provider]  gabapentin (NEURONTIN) 100 MG capsule TAKE ONE CAPSULE BY MOUTH TWICE DAILY Patient taking differently: TAKE ONE CAPSULE BY MOUTH TWICE DAILY AS NEEDED FOR NERVE PAIN 04/14/17  Yes Regal, Tamala Fothergill, DPM  loratadine (CLARITIN) 10 MG tablet Take 10 mg by mouth daily as needed. For allergies.   Yes [provider]  mycophenolate (CELLCEPT) 500 MG tablet 500 mg TWICE DAILY 03/29/15  Yes [provider]  omega-3 acid ethyl esters (LOVAZA) 1 g capsule Take 2 g by mouth 2 (two) times daily.  10/09/16  Yes [provider]  OVER THE COUNTER MEDICATION Take 1 capsule by mouth daily. Beet Root   Yes [provider]  PAPAYA PO Take 1 tablet by mouth daily.   Yes [provider]  predniSONE (DELTASONE) 5 MG tablet Take 5 mg by mouth daily. Taper as directed by physician FOR 30 DAYS 09/05/16  Yes [provider]  psyllium (HYDROCIL/METAMUCIL) 95 % PACK Take 1 packet by mouth daily. Patient taking differently: Take 1 packet by mouth daily as needed  (gas).  05/15/11  Yes [name], MD  sodium chloride (OCEAN) 0.65 % nasal spray Place 1 spray into the nose at bedtime as needed for congestion (allergies).    Yes [provider]  ULORIC 40 MG tablet Take 40 mg by mouth daily. 06/20/16  Yes [provider]  VITAMIN A PO Take 1 tablet by mouth daily.   Yes [provider]  vitamin B-12 (CYANOCOBALAMIN) 1000 MCG tablet Take 1,000 mcg by mouth daily.     Yes [provider]  VITAMIN E PO Take 1 tablet by mouth.   Yes [provider]  acetaminophen (TYLENOL 8 HOUR) 650 MG CR tablet Take 1 tablet (650 mg total) by mouth every 8 (eight) hours as needed. 05/15/17   Varney Biles, MD  BESIVANCE 0.6 % SUSP Place 1 drop into both eyes daily. 05/13/17   [provider]  ciprofloxacin (CIPRO)  500 MG tablet Take 1 tablet (500 mg total) by mouth every 12 (twelve) hours. 05/15/17   Kayson Bullis, MD  DUREZOL 0.05 % EMUL Place 1 drop into both eyes daily. 05/13/17   [provider]  metroNIDAZOLE (FLAGYL) 500 MG tablet Take 1 tablet (500 mg total) by mouth 2 (two) times daily. 05/15/17   Varney Biles, MD  potassium chloride SA (K-DUR,KLOR-CON) 20 MEQ tablet Take 2 tablets (40 mEq total) by mouth 2 (two) times daily. 05/15/17   Rithvik Orcutt, MD  PROLENSA 0.07 % SOLN Place 1 drop into both eyes daily. As directed 05/13/17   [provider]    Family History Family History  Problem Relation Age of Onset  . Dementia Mother   . Hypertension Father   . Malignant hyperthermia Father   . Colon cancer Neg Hx     Social History Social History   Tobacco Use  . Smoking status: Never Smoker  . Smokeless tobacco: Former Systems developer    Types: Snuff  . Tobacco comment: Tobacco info given 05/11/13  Substance Use Topics  . Alcohol use: No  . Drug use: No     Allergies   Codeine; Hydrocodone; and Tramadol hcl   Review of Systems Review of Systems  Constitutional: Positive for activity change.    Respiratory: Negative for shortness of breath.   Cardiovascular: Negative for chest pain.  Gastrointestinal: Positive for abdominal pain. Negative for nausea and vomiting.  Allergic/Immunologic: Negative for immunocompromised state.  Hematological: Does not bruise/bleed easily.  All other systems reviewed and are negative.    Physical Exam Updated Vital Signs BP (!) 178/165   Pulse 85   Temp 97.6 F (36.4 C) (Oral)   Resp 18   Ht 5\' 3"  (1.6 m)   Wt 77.1 kg (170 lb)   SpO2 98%   BMI 30.11 kg/m   Physical Exam  Constitutional: She is oriented to person, place, and time. She appears well-developed.  HENT:  Head: Normocephalic and atraumatic.  Eyes: EOM are normal.  Neck: Normal range of motion. Neck supple.  Cardiovascular: Normal rate.  Pulmonary/Chest: Effort normal.  Abdominal: Bowel sounds are normal. There is tenderness in the periumbilical area and left lower quadrant. There is guarding. There is no rebound.  Neurological: She is alert and oriented to person, place, and time.  Skin: Skin is warm and dry.  Nursing note and vitals reviewed.    ED Treatments / Results  Labs (all labs ordered are listed, but only abnormal results are displayed) Labs Reviewed  COMPREHENSIVE METABOLIC PANEL - Abnormal; Notable for the following components:      Result Value   Potassium 2.4 (*)    Creatinine, Ser 0.39 (*)    Calcium 8.6 (*)    Total Protein 6.4 (*)    Albumin 3.3 (*)    All other components within normal limits  CBC - Abnormal; Notable for the following components:   RBC 3.54 (*)    Hemoglobin 9.2 (*)    HCT 29.3 (*)    RDW 17.4 (*)    All other components within normal limits  URINALYSIS, ROUTINE W REFLEX MICROSCOPIC - Abnormal; Notable for the following components:   Specific Gravity, Urine 1.033 (*)    Hgb urine dipstick SMALL (*)    Protein, ur 30 (*)    All other components within normal limits  LIPASE, BLOOD    EKG EKG  Interpretation  Date/Time:  Friday May 15 2017 11:57:28 EDT Ventricular Rate:  59  PR Interval:    QRS Duration: 91 QT Interval:  418 QTC Calculation: 415 R Axis:   45 Text Interpretation:  Sinus rhythm Atrial premature complex No acute changes Nonspecific ST abnormality Confirmed by Varney Biles [phone]) on 05/15/2017 2:24:35 PM   Radiology Ct Abdomen Pelvis W Contrast  Result Date: 05/15/2017 CLINICAL DATA:  Left upper and mid abdominal pain for 4 days. Abdominal pain, diverticulitis suspected. EXAM: CT ABDOMEN AND PELVIS WITH CONTRAST TECHNIQUE: Multidetector CT imaging of the abdomen and pelvis was performed using the standard protocol following bolus administration of intravenous contrast. CONTRAST:  172mL ISOVUE-300 IOPAMIDOL (ISOVUE-300) INJECTION 61% COMPARISON:  Renal ultrasound 01/02/2017. CT of the abdomen pelvis 05/03/2015. FINDINGS: Lower chest: Linear atelectasis or scarring is again seen at the right lung base. There is minimal atelectasis at the left lung base. Heart size is normal. Coronary artery calcifications are present. No significant pleural or pericardial effusion is present. Hepatobiliary: No focal liver abnormality is seen. No gallstones, gallbladder wall thickening, or biliary dilatation. Pancreas: Unremarkable. No pancreatic ductal dilatation or surrounding inflammatory changes. Spleen: Normal in size without focal abnormality. Adrenals/Urinary Tract: The adrenal glands are normal. Right upper pole simple cyst of the right kidney measures 3.2 cm. Other subcentimeter cortical cysts are stable. Subcortical cysts are present in the left kidney is well. No stone or obstruction is present. No other mass lesion is present. Ureters and urinary bladder are within normal limits. Stomach/Bowel: The stomach and duodenum are within normal limits. Small bowel is unremarkable. Terminal ileum is within normal limits. Appendix is not discretely visualized and may be surgically absent. The  ascending and proximal transverse colon are normal. Diverticular changes are present within the descending sigmoid colon. There is mild inflammatory change surrounding the splenic flexure. There is some thickening of the distal sigmoid wall without inflammatory change. This is likely due to under distention. Vascular/Lymphatic: Atherosclerotic calcifications are present within the aorta and branch vessels. Bilateral renal artery ostia calcifications are present. Calcifications extend into the iliac vessels bilaterally without aneurysm or focal stenosis. No significant adenopathy is present. Reproductive: Hysterectomy is noted. Adnexa are within normal limits. Other: No abdominal wall hernia or abnormality. No abdominopelvic ascites. Musculoskeletal: Lumbar fusion at L5-S1 is stable. A remote superior endplate fracture at L4 is stable. Vertebral body heights and alignment are otherwise stable. Leftward curvature lumbar spine is again noted. Bony pelvis is intact. No focal lytic or blastic lesions are present. IMPRESSION: 1. Diverticulosis with subtle inflammatory changes in the splenic flexure which could reflect early diverticulitis. 2. No free air or fluid to suggest complicating features or abscess. 3. Appendix not visualized. 4.  Aortic Atherosclerosis (ICD10-I70.0). 5. Coronary artery disease 6. Degenerative and postoperative changes of the lumbosacral spine. 7. Stable renal cysts. Electronically Signed   By: San Morelle M.D.   On: 05/15/2017 13:08    Procedures Procedures (including critical care time)  Medications Ordered in ED Medications  iopamidol (ISOVUE-300) 61 % injection (has no administration in time range)  sodium chloride 0.9 % injection (has no administration in time range)  heparin lock flush 100 unit/mL (has no administration in time range)  famotidine (PEPCID) IVPB 20 mg premix (0 mg Intravenous Stopped 05/15/17 1225)  iopamidol (ISOVUE-300) 61 % injection 100 mL (100 mLs  Intravenous Contrast Given 05/15/17 1216)  ciprofloxacin (CIPRO) tablet 500 mg (500 mg Oral Given 05/15/17 1441)  metroNIDAZOLE (FLAGYL) tablet 500 mg (500 mg Oral Given 05/15/17 1440)  potassium chloride SA (K-DUR,KLOR-CON) CR tablet 60 mEq (60  mEq Oral Given 05/15/17 1441)     Initial Impression / Assessment and Plan / ED Course  I have reviewed the triage vital signs and the nursing notes.  Pertinent labs & imaging results that were available during my care of the patient were reviewed by me and considered in my medical decision making (see chart for details).     77 year old female comes in with chief complaint of abdominal pain.  Patient's abdominal pain is located on the left side and radiates to the periumbilical region.  Patient has history of chronic steroid use, and she also has a diverticulosis history based on her colonoscopy.  Patient never has had diverticulitis.  Differential diagnosis includes diverticulitis, pyelonephritis, UTI, renal stones.  CT scan ordered and confirms diverticulitis, which was highest on her differential diagnosis.  We will start patient on oral Cipro and Flagyl.  Strict ER return precautions have been discussed.  Patient is also noted to have severe hypokalemia.  Seems like in the past she has required oral potassium supplement.  We will start her on oral potassium and I have asked patient verbally to see her primary care doctor for recheck..  Final Clinical Impressions(s) / ED Diagnoses   Final diagnoses:  Acute diverticulitis  Acute hypokalemia    ED Discharge Orders        Ordered    ciprofloxacin (CIPRO) 500 MG tablet  Every 12 hours     05/15/17 1429    metroNIDAZOLE (FLAGYL) 500 MG tablet  2 times daily     05/15/17 1429    acetaminophen (TYLENOL 8 HOUR) 650 MG CR tablet  Every 8 hours PRN     05/15/17 1429    potassium chloride SA (K-DUR,KLOR-CON) 20 MEQ tablet  2 times daily     05/15/17 Olds, Binnie Droessler, MD 05/15/17 1515

## 2017-05-15 NOTE — ED Notes (Signed)
WICK PUT IN PLACER TO COLLECT URINE SAMPLE

## 2017-05-15 NOTE — ED Notes (Signed)
RN notified of abnormal lab 

## 2017-06-01 ENCOUNTER — Encounter (HOSPITAL_COMMUNITY): Payer: Medicare Other

## 2017-06-09 ENCOUNTER — Other Ambulatory Visit: Payer: Self-pay

## 2017-06-09 ENCOUNTER — Emergency Department (HOSPITAL_COMMUNITY): Payer: Medicare Other

## 2017-06-09 ENCOUNTER — Encounter (HOSPITAL_COMMUNITY): Payer: Self-pay

## 2017-06-09 ENCOUNTER — Inpatient Hospital Stay (HOSPITAL_COMMUNITY)
Admission: EM | Admit: 2017-06-09 | Discharge: 2017-06-12 | DRG: 064 | Disposition: A | Discharge status: Rehabilitation Facility | Payer: Medicare Other | Attending: Internal Medicine | Admitting: Internal Medicine

## 2017-06-09 DIAGNOSIS — Q2730 Arteriovenous malformation, site unspecified: Secondary | ICD-10-CM

## 2017-06-09 DIAGNOSIS — R29713 NIHSS score 13: Secondary | ICD-10-CM | POA: Diagnosis present

## 2017-06-09 DIAGNOSIS — G14 Postpolio syndrome: Secondary | ICD-10-CM | POA: Diagnosis present

## 2017-06-09 DIAGNOSIS — R402362 Coma scale, best motor response, obeys commands, at arrival to emergency department: Secondary | ICD-10-CM | POA: Diagnosis present

## 2017-06-09 DIAGNOSIS — Z885 Allergy status to narcotic agent status: Secondary | ICD-10-CM

## 2017-06-09 DIAGNOSIS — I639 Cerebral infarction, unspecified: Secondary | ICD-10-CM | POA: Diagnosis present

## 2017-06-09 DIAGNOSIS — R531 Weakness: Secondary | ICD-10-CM | POA: Diagnosis not present

## 2017-06-09 DIAGNOSIS — E876 Hypokalemia: Secondary | ICD-10-CM | POA: Diagnosis present

## 2017-06-09 DIAGNOSIS — R29818 Other symptoms and signs involving the nervous system: Secondary | ICD-10-CM

## 2017-06-09 DIAGNOSIS — J069 Acute upper respiratory infection, unspecified: Secondary | ICD-10-CM | POA: Diagnosis present

## 2017-06-09 DIAGNOSIS — I672 Cerebral atherosclerosis: Secondary | ICD-10-CM | POA: Diagnosis present

## 2017-06-09 DIAGNOSIS — Z8673 Personal history of transient ischemic attack (TIA), and cerebral infarction without residual deficits: Secondary | ICD-10-CM

## 2017-06-09 DIAGNOSIS — R918 Other nonspecific abnormal finding of lung field: Secondary | ICD-10-CM | POA: Diagnosis present

## 2017-06-09 DIAGNOSIS — I5032 Chronic diastolic (congestive) heart failure: Secondary | ICD-10-CM | POA: Diagnosis not present

## 2017-06-09 DIAGNOSIS — Z87891 Personal history of nicotine dependence: Secondary | ICD-10-CM

## 2017-06-09 DIAGNOSIS — M3320 Polymyositis, organ involvement unspecified: Secondary | ICD-10-CM | POA: Diagnosis not present

## 2017-06-09 DIAGNOSIS — E785 Hyperlipidemia, unspecified: Secondary | ICD-10-CM | POA: Diagnosis present

## 2017-06-09 DIAGNOSIS — Z8249 Family history of ischemic heart disease and other diseases of the circulatory system: Secondary | ICD-10-CM

## 2017-06-09 DIAGNOSIS — E559 Vitamin D deficiency, unspecified: Secondary | ICD-10-CM | POA: Diagnosis present

## 2017-06-09 DIAGNOSIS — I110 Hypertensive heart disease with heart failure: Secondary | ICD-10-CM | POA: Diagnosis present

## 2017-06-09 DIAGNOSIS — I63519 Cerebral infarction due to unspecified occlusion or stenosis of unspecified middle cerebral artery: Principal | ICD-10-CM | POA: Diagnosis present

## 2017-06-09 DIAGNOSIS — I10 Essential (primary) hypertension: Secondary | ICD-10-CM | POA: Diagnosis present

## 2017-06-09 DIAGNOSIS — K5520 Angiodysplasia of colon without hemorrhage: Secondary | ICD-10-CM | POA: Diagnosis present

## 2017-06-09 DIAGNOSIS — R402142 Coma scale, eyes open, spontaneous, at arrival to emergency department: Secondary | ICD-10-CM | POA: Diagnosis present

## 2017-06-09 DIAGNOSIS — K219 Gastro-esophageal reflux disease without esophagitis: Secondary | ICD-10-CM | POA: Diagnosis present

## 2017-06-09 DIAGNOSIS — R4701 Aphasia: Secondary | ICD-10-CM | POA: Diagnosis present

## 2017-06-09 DIAGNOSIS — G8250 Quadriplegia, unspecified: Secondary | ICD-10-CM | POA: Diagnosis present

## 2017-06-09 DIAGNOSIS — Z993 Dependence on wheelchair: Secondary | ICD-10-CM

## 2017-06-09 DIAGNOSIS — R402252 Coma scale, best verbal response, oriented, at arrival to emergency department: Secondary | ICD-10-CM | POA: Diagnosis present

## 2017-06-09 DIAGNOSIS — M810 Age-related osteoporosis without current pathological fracture: Secondary | ICD-10-CM | POA: Diagnosis present

## 2017-06-09 LAB — DIFFERENTIAL
BASOS ABS: 0 10*3/uL (ref 0.0–0.1)
BASOS PCT: 0 %
EOS ABS: 0.1 10*3/uL (ref 0.0–0.7)
Eosinophils Relative: 2 %
Lymphocytes Relative: 33 %
Lymphs Abs: 2.4 10*3/uL (ref 0.7–4.0)
Monocytes Absolute: 0.7 10*3/uL (ref 0.1–1.0)
Monocytes Relative: 10 %
NEUTROS PCT: 55 %
Neutro Abs: 4.2 10*3/uL (ref 1.7–7.7)

## 2017-06-09 LAB — URINALYSIS, ROUTINE W REFLEX MICROSCOPIC
BILIRUBIN URINE: NEGATIVE
Bacteria, UA: NONE SEEN
GLUCOSE, UA: NEGATIVE mg/dL
HGB URINE DIPSTICK: NEGATIVE
Ketones, ur: NEGATIVE mg/dL
NITRITE: NEGATIVE
PH: 5 (ref 5.0–8.0)
Protein, ur: NEGATIVE mg/dL
SPECIFIC GRAVITY, URINE: 1.023 (ref 1.005–1.030)

## 2017-06-09 LAB — CBC
HCT: 31.2 % — ABNORMAL LOW (ref 36.0–46.0)
Hemoglobin: 9.6 g/dL — ABNORMAL LOW (ref 12.0–15.0)
MCH: 25.8 pg — ABNORMAL LOW (ref 26.0–34.0)
MCHC: 30.8 g/dL (ref 30.0–36.0)
MCV: 83.9 fL (ref 78.0–100.0)
PLATELETS: 296 10*3/uL (ref 150–400)
RBC: 3.72 MIL/uL — ABNORMAL LOW (ref 3.87–5.11)
RDW: 17.8 % — AB (ref 11.5–15.5)
WBC: 7.4 10*3/uL (ref 4.0–10.5)

## 2017-06-09 LAB — I-STAT CHEM 8, ED
BUN: 9 mg/dL (ref 6–20)
Calcium, Ion: 1.27 mmol/L (ref 1.15–1.40)
Chloride: 105 mmol/L (ref 101–111)
Creatinine, Ser: 0.5 mg/dL (ref 0.44–1.00)
Glucose, Bld: 81 mg/dL (ref 65–99)
HEMATOCRIT: 31 % — AB (ref 36.0–46.0)
HEMOGLOBIN: 10.5 g/dL — AB (ref 12.0–15.0)
POTASSIUM: 3.6 mmol/L (ref 3.5–5.1)
SODIUM: 142 mmol/L (ref 135–145)
TCO2: 27 mmol/L (ref 22–32)

## 2017-06-09 LAB — COMPREHENSIVE METABOLIC PANEL
ALBUMIN: 3.6 g/dL (ref 3.5–5.0)
ALT: 18 U/L (ref 14–54)
ANION GAP: 11 (ref 5–15)
AST: 24 U/L (ref 15–41)
Alkaline Phosphatase: 49 U/L (ref 38–126)
BUN: 12 mg/dL (ref 6–20)
CHLORIDE: 104 mmol/L (ref 101–111)
CO2: 25 mmol/L (ref 22–32)
Calcium: 9.2 mg/dL (ref 8.9–10.3)
Creatinine, Ser: 0.42 mg/dL — ABNORMAL LOW (ref 0.44–1.00)
GFR calc Af Amer: >60 mL/min (ref >60)
GFR calc non Af Amer: >60 mL/min (ref >60)
GLUCOSE: 84 mg/dL (ref 65–99)
POTASSIUM: 3.6 mmol/L (ref 3.5–5.1)
Sodium: 140 mmol/L (ref 135–145)
Total Bilirubin: 0.5 mg/dL (ref 0.3–1.2)
Total Protein: 6.2 g/dL — ABNORMAL LOW (ref 6.5–8.1)

## 2017-06-09 LAB — MAGNESIUM: Magnesium: 1.6 mg/dL — ABNORMAL LOW (ref 1.7–2.4)

## 2017-06-09 LAB — PROTIME-INR
INR: 1.01
PROTHROMBIN TIME: 13.2 s (ref 11.4–15.2)

## 2017-06-09 LAB — APTT: APTT: 95 s — AB (ref 24–36)

## 2017-06-09 LAB — I-STAT TROPONIN, ED: TROPONIN I, POC: 0 ng/mL (ref 0.00–0.08)

## 2017-06-09 LAB — CK: CK TOTAL: 581 U/L — AB (ref 38–234)

## 2017-06-09 MED ORDER — LORAZEPAM 2 MG/ML IJ SOLN
0.5000 mg | Freq: Once | INTRAMUSCULAR | Status: DC
  Filled 2017-06-09: qty 1

## 2017-06-09 MED ORDER — IOPAMIDOL (ISOVUE-370) INJECTION 76%
100.0000 mL | Freq: Once | INTRAVENOUS | Status: AC | PRN
  Administered 2017-06-09: 80 mL via INTRAVENOUS

## 2017-06-09 MED ORDER — HYDRALAZINE HCL 20 MG/ML IJ SOLN: 10.0000 mg | INTRAMUSCULAR | Status: DC | PRN

## 2017-06-09 MED ORDER — ASPIRIN 325 MG PO TABS
325.0000 mg | ORAL_TABLET | Freq: Once | ORAL | Status: AC
  Administered 2017-06-09: 325 mg via ORAL
  Filled 2017-06-09: qty 1

## 2017-06-09 MED ORDER — MAGNESIUM SULFATE 2 GM/50ML IV SOLN
2.0000 g | Freq: Once | INTRAVENOUS | Status: AC
  Administered 2017-06-10: 2 g via INTRAVENOUS
  Filled 2017-06-09: qty 50

## 2017-06-09 NOTE — ED Triage Notes (Signed)
Pt BIB EMS from home--Pt reports waking up this morning with increasing numbness, starting in bilateral hands which has progressed to "all over." and weakness. Pt reports having a head cold the past few days, taking prednisone. Denies pain, denies any slurred speech, denies unilateral weakness, chest pain or chest pain, N/V.

## 2017-06-09 NOTE — ED Provider Notes (Signed)
[place] [facility] DEPT Provider Note   CSN: [id number] Arrival date & time: 06/09/17  1423     History   Chief Complaint Chief Complaint  Patient presents with  . Weakness  . Numbness    HPI [name] [name] is a 77 y.o. female.  HPI Patient is wheelchair-bound presents with increasing tingling to bilateral upper and lower extremities that has progressed since waking this morning.  Patient was at her baseline yesterday evening.  She also complains of generalized weakness.  Caretaker at bedside side states she her speech is slower.  She complains of nasal congestion and sinus pressure.  She is had some subjective fevers and chills.  Denies shortness of breath or chest pain.  No nausea, vomiting or diarrhea.  Denies urinary symptoms including frequency, urgency or dysuria. Past Medical History:  Diagnosis Date  . Allergic rhinitis, cause unspecified   . Benign neoplasm of colon   . Esophageal reflux   . Excessive daytime sleepiness   . 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 GI hemorrhage 2011   recurrent  . Hyperlipidemia   . Osteoporosis   . Polymyositis ([place])   . Unspecified essential hypertension   . Vitamin D deficiency     Patient Active Problem List   Diagnosis Date Noted  . Hypothermia 10/22/2016  . Chronic diastolic CHF (congestive heart failure) ([place]) 10/22/2016  . Abdominal pain, unspecified site 05/17/2013  . Nausea alone 05/17/2013  . 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 Abdominal pain, left lower quadrant 05/28/2011  . Cecal ulcer with hemorrhage 01/10/2011  . Acute blood loss anemia 12/06/2010  . Hematochezia 12/05/2010  . Anemia 12/05/2010  . Adrenal insufficiency ([place]) 12/05/2010  . Osteoporosis 12/05/2010  . LUNG INVOLVEMENT OTHER DISEASES CLASSIFIED ELSW 08/13/2009  . OTHER SPECIFIED DISORDER OF STOMACH AND DUODENUM 07/06/2008  . Essential hypertension 04/26/2007  . ALLERGIC  RHINITIS 04/26/2007  . GASTROESOPHAGEAL REFLUX DISEASE 04/26/2007  . Polymyositis ([place]) 04/26/2007  . COLONIC POLYPS, ADENOMATOUS 02/06/2005    Past Surgical History:  Procedure Laterality Date  . ABDOMINAL HYSTERECTOMY     partial  . bilateral  rotator cuff surgery    . COLONOSCOPY  01/09/2011   others also  . COLONOSCOPY  05/29/2011   Procedure: COLONOSCOPY;  Surgeon: Gatha Mayer, MD;  Location: [facility];  Service: Endoscopy;  Laterality: N/A;  Greggory Brandy Carlean Purl  . COLONOSCOPY WITH PROPOFOL N/A 02/16/2014   Procedure: COLONOSCOPY WITH PROPOFOL;  Surgeon: Milus Banister, MD;  Location: [facility];  Service: Endoscopy;  Laterality: N/A;  . ESOPHAGOGASTRODUODENOSCOPY  12/08/2010   others also  . ESOPHAGOGASTRODUODENOSCOPY (EGD) WITH PROPOFOL N/A 02/16/2014   Procedure: ESOPHAGOGASTRODUODENOSCOPY (EGD) WITH PROPOFOL;  Surgeon: Milus Banister, MD;  Location: [facility];  Service: Endoscopy;  Laterality: N/A;  . EUS    . HOT HEMOSTASIS  05/29/2011   Procedure: HOT HEMOSTASIS (ARGON PLASMA COAGULATION/BICAP);  Surgeon: Gatha Mayer, MD;  Location: [facility];  Service: Endoscopy;  Laterality: N/A;  . LUMBAR LAMINECTOMY    . TONSILLECTOMY  age 4     OB History   None      Home Medications    Prior to Admission medications   Medication Sig Start Date End Date Taking? Authorizing Provider  allopurinol (ZYLOPRIM) 300 MG tablet Take 300 mg by mouth daily. 10/11/16  Yes [provider]  Ascorbic Acid (VITAMIN C) 1000 MG tablet Take 1,000 mg by mouth daily.    Yes  [provider]  atenolol (TENORMIN) 50 MG tablet Take 50 mg by mouth daily.     Yes [provider]  Calcium Carbonate (CALTRATE 600) 1500 MG TABS Take 2 tablets by mouth daily.    Yes [provider]  Cholecalciferol (VITAMIN D PO) Take 2 tablets by mouth daily.    Yes [provider]  DUREZOL 0.05 % EMUL Place 1 drop into both eyes daily. 05/13/17  Yes [provider]  esomeprazole (NEXIUM) 40 MG capsule Take one capsule by thirty minutes before breakfast Take one capsule by thirty minutes before dinner Patient taking differently: Take 40 mg by mouth 2 (two) times daily before a meal. Take one capsule by thirty minutes before breakfast Take one capsule by thirty minutes before dinner 05/11/13  Yes Zehr, Laban Emperor, PA-C  folic acid (FOLVITE) 403 MCG tablet Take 400 mcg by mouth daily.     Yes [provider]  furosemide (LASIX) 40 MG tablet Take 40 mg by mouth daily.     Yes [provider]  mycophenolate (CELLCEPT) 500 MG tablet 500 mg TWICE DAILY 03/29/15  Yes [provider]  omega-3 acid ethyl esters (LOVAZA) 1 g capsule Take 2 g by mouth 2 (two) times daily.  10/09/16  Yes [provider]  OVER THE COUNTER MEDICATION Take 1 capsule by mouth daily. Beet Root   Yes [provider]  PAPAYA PO Take 1 tablet by mouth daily.   Yes [provider]  potassium chloride SA (K-DUR,KLOR-CON) 20 MEQ tablet Take 2 tablets (40 mEq total) by mouth 2 (two) times daily. 05/15/17  Yes Nanavati, Ankit, MD  predniSONE (DELTASONE) 20 MG tablet TK 1 T PO ONCE D FOR 6 DAYS 06/03/17  Yes [provider]  PROLENSA 0.07 % SOLN Place 1 drop into both eyes daily. As directed 05/13/17  Yes [provider]  psyllium (HYDROCIL/METAMUCIL) 95 % PACK Take 1 packet by mouth daily. Patient taking differently: Take 1 packet by mouth daily as needed (gas).  05/15/11  Yes [name], MD  ULORIC 40 MG tablet Take 40 mg by mouth daily. 06/20/16  Yes [provider]  VITAMIN A PO Take 1 tablet by mouth daily.   Yes [provider]  vitamin B-12 (CYANOCOBALAMIN) 1000 MCG tablet Take 1,000 mcg by mouth daily.     Yes [provider]  VITAMIN E PO Take 1 tablet by mouth.   Yes [provider]  acetaminophen (TYLENOL 8 HOUR) 650 MG CR tablet Take 1 tablet (650 mg total) by mouth every 8 (eight) hours  as needed. Patient taking differently: Take 650 mg by mouth every 8 (eight) hours as needed for pain.  05/15/17   Varney Biles, MD  ALPRAZolam Duanne Moron) 0.25 MG tablet Take 0.25 mg by mouth 2 (two) times daily as needed for anxiety or sleep.     [provider]  benzonatate (TESSALON) 100 MG capsule TK 1 C PO TID PRN COUGH 05/26/17   [provider]  CARAFATE 1 GM/10ML suspension Take 1 g by mouth 2 (two) times daily as needed (GAS/INDIGESTION).  11/25/16   [provider]  ciprofloxacin (CIPRO) 500 MG tablet Take 1 tablet (500 mg total) by mouth every 12 (twelve) hours. Patient not taking: Reported on 06/09/2017 05/15/17   Varney Biles, MD  colchicine 0.6 MG tablet Take 0.6 mg by mouth. Take 1 tablet by mouth every 8 hours as needed for gout flare 10/13/16   [provider]  fluticasone (FLONASE) 50 MCG/ACT nasal spray Place 1 spray into both nostrils daily as needed for allergies.  06/05/17   [provider]  gabapentin (NEURONTIN) 100 MG capsule TAKE ONE CAPSULE BY MOUTH TWICE DAILY Patient taking differently: TAKE ONE CAPSULE BY MOUTH TWICE DAILY AS NEEDED FOR NERVE PAIN 04/14/17   Wallene Huh, DPM  loratadine (CLARITIN) 10 MG tablet Take 10 mg by mouth daily as needed. For allergies.    [provider]  metroNIDAZOLE (FLAGYL) 500 MG tablet Take 1 tablet (500 mg total) by mouth 2 (two) times daily. Patient not taking: Reported on 06/09/2017 05/15/17   Varney Biles, MD  sodium chloride (OCEAN) 0.65 % nasal spray Place 1 spray into the nose at bedtime as needed for congestion (allergies).     [provider]    Family History Family History  Problem Relation Age of Onset  . Dementia Mother   . Hypertension Father   . Malignant hyperthermia Father   . Colon cancer Neg Hx     Social History Social History   Tobacco Use  . Smoking status: Never Smoker  . Smokeless tobacco: Former Systems developer    Types: Snuff  . Tobacco comment:  Tobacco info given 05/11/13  Substance Use Topics  . Alcohol use: No  . Drug use: No     Allergies   Codeine; Hydrocodone; and Tramadol hcl   Review of Systems Review of Systems  Constitutional: Positive for chills, fatigue and fever.  HENT: Positive for congestion and sinus pressure. Negative for sore throat.   Eyes: Negative for visual disturbance.  Respiratory: Negative for cough.   Cardiovascular: Negative for chest pain, palpitations and leg swelling.  Gastrointestinal: Negative for abdominal pain, diarrhea, nausea and vomiting.  Genitourinary: Negative for dysuria, flank pain and frequency.  Musculoskeletal: Positive for gait problem. Negative for back pain, myalgias, neck pain and neck stiffness.  Skin: Negative for rash and wound.  Neurological: Positive for speech difficulty, weakness and numbness. Negative for dizziness, light-headedness and headaches.  All other systems reviewed and are negative.    Physical Exam Updated Vital Signs BP (!) 170/76   Pulse (!) 130   Temp (!) 97.3 F (36.3 C)   Resp 19   Ht 5\' 3"  (1.6 m)   Wt 77.1 kg (170 lb)   SpO2 97%   BMI 30.11 kg/m   Physical Exam  Constitutional: She is oriented to person, place, and time. She appears well-developed and well-nourished. No distress.  HENT:  Head: Normocephalic and atraumatic.  Mouth/Throat: Oropharynx is clear and moist.  Bilateral nasal mucosal edema.  Bilateral maxillary sinus tenderness to percussion.  Eyes: Pupils are equal, round, and reactive to light. EOM are normal.  Neck: Normal range of motion. Neck supple.  No meningismus  Cardiovascular: Normal rate and regular rhythm. Exam reveals no gallop and no friction rub.  No murmur heard. Pulmonary/Chest: Effort normal and breath sounds normal. No stridor. No respiratory distress. She has no wheezes. She has no rales. She exhibits no tenderness.  Abdominal: Soft. Bowel sounds are normal. There is no tenderness. There is no rebound  and no guarding.  Musculoskeletal: Normal range of motion. She exhibits no edema or tenderness.  Decreased range of motion bilateral shoulders which patient caretakers state is chronic.  Distal pulses are 2+.  No lower extremity swelling, asymmetry or tenderness.  Lymphadenopathy:    She has no cervical adenopathy.  Neurological: She is alert and oriented to person, place, and time.  Patient with slurring of speech.  3/5 motor in all extremities with decreased sensation to light touch.  Does not appear to be lateralizing symptoms.  Skin: Skin is warm and dry. Capillary refill takes less than 2 seconds. No rash noted. She is not diaphoretic. No erythema.  Psychiatric: She has a normal mood and affect. Her behavior is normal.  Nursing note and vitals reviewed.    ED Treatments / Results  Labs (all labs ordered are listed, but only abnormal results are displayed) Labs Reviewed  APTT - Abnormal; Notable for the following components:      Result Value   aPTT 95 (*)    All other components within normal limits  CBC - Abnormal; Notable for the following components:   RBC 3.72 (*)    Hemoglobin 9.6 (*)    HCT 31.2 (*)    MCH 25.8 (*)    RDW 17.8 (*)    All other components within normal limits  COMPREHENSIVE METABOLIC PANEL - Abnormal; Notable for the following components:   Creatinine, Ser 0.42 (*)    Total Protein 6.2 (*)    All other components within normal limits  MAGNESIUM - Abnormal; Notable for the following components:   Magnesium 1.6 (*)    All other components within normal limits  CK - Abnormal; Notable for the following components:   Total CK 581 (*)    All other components within normal limits  I-STAT CHEM 8, ED - Abnormal; Notable for the following components:   Hemoglobin 10.5 (*)    HCT 31.0 (*)    All other components within normal limits  PROTIME-INR  DIFFERENTIAL  URINALYSIS, ROUTINE W REFLEX MICROSCOPIC  I-STAT TROPONIN, ED    EKG EKG  Interpretation  Date/Time:  Tuesday Jun 09 2017 15:05:29 EDT Ventricular Rate:  63 PR Interval:    QRS Duration: 90 QT Interval:  388 QTC Calculation: 398 R Axis:   61 Text Interpretation:  Sinus rhythm Abnormal R-wave progression, early transition Confirmed by Julianne Rice [phone]) on 06/09/2017 8:46:38 PM   Radiology Ct Head Wo Contrast  Result Date: 06/09/2017 CLINICAL DATA:  Numbness and weakness. EXAM: CT HEAD WITHOUT CONTRAST TECHNIQUE: Contiguous axial images were obtained from the base of the skull through the vertex without intravenous contrast. COMPARISON:  10/22/2016 FINDINGS: Brain: More prominent hypodensity along the anterior limb of the right internal capsule may represent a subacute lacunar infarct. Older adjacent lacunar infarct of the basal ganglia appears stable. Stable advanced small vessel ischemic disease in the periventricular white matter. The brain demonstrates no evidence of hemorrhage, edema, mass effect, extra-axial fluid collection, hydrocephalus or mass lesion. Vascular: No hyperdense vessel or unexpected calcification. Skull: The skull shows osteopenia without evidence of fracture or bony lesions. Sinuses/Orbits: There are air-fluid levels in both visualized maxillary antra. Mucosal thickening also noted in bilateral ethmoid and sphenoid air cells. Other: None. IMPRESSION: 1. More prominent hypodensity along the anterior limb of the right internal capsule may represent a subacute lacunar infarct. Older lacunar infarcts and advanced small vessel disease appears stable. 2. New air-fluid levels in both maxillary antra as well as mucosal thickening in bilateral ethmoid and sphenoid air cells are suggestive of active sinusitis. Electronically Signed   By: Aletta Edouard M.D.   On: 06/09/2017 15:37   Mr Brain Wo Contrast  Result Date: 06/09/2017 CLINICAL DATA:  Generalized numbness beginning in upper extremity since this morning, weakness. Recent viral illness, on  prednisone. EXAM: MRI HEAD WITHOUT CONTRAST MRI CERVICAL SPINE  WITHOUT CONTRAST TECHNIQUE: Multiplanar, multiecho pulse sequences of the brain and surrounding structures, and cervical spine, to include the craniocervical junction and cervicothoracic junction, were obtained without intravenous contrast. COMPARISON:  CT HEAD Jun 09, 2017 and MRI of the cervical spine March 23, 2017 and MRI of the head April 22, 2012 FINDINGS: MRI HEAD FINDINGS INTRACRANIAL CONTENTS: Patchy reduced diffusion LEFT frontoparietal lobes with low ADC values. Subcentimeter reduced diffusion LEFT thalamus with normalized ADC values. No susceptibility artifact to suggest hemorrhage. Confluent supratentorial white matter FLAIR T2 hyperintensities. Patchy pontine T2 hyperintensities. Old RIGHT basal ganglia and RIGHT thalamus cystic small infarcts. Hazy T2 hyperintensities bilateral basal ganglia and bilateral thalami seen with chronic small vessel ischemic changes. No parenchymal brain volume loss for age. No midline shift, mass effect or masses. VASCULAR: Normal major intracranial vascular flow voids present at skull base. SKULL AND UPPER CERVICAL SPINE: No abnormal sellar expansion. No suspicious calvarial bone marrow signal. Craniocervical junction maintained. SINUSES/ORBITS: The mastoid air-cells and included paranasal sinuses are well-aerated.The included ocular globes and orbital contents are non-suspicious. OTHER: None. MRI CERVICAL SPINE FINDINGS ALIGNMENT: Straightened cervical lordosis. Minimal grade 1 C4-5 anterolisthesis. VERTEBRAE/DISCS: Vertebral bodies are intact. Intervertebral disc and demonstrate normal morphology with mild desiccation. No abnormal or acute bone marrow signal. CORD:Cervical spinal cord is normal morphology and signal characteristics from the cervicomedullary junction to level of T2-3, the most caudal well visualized level. POSTERIOR FOSSA, VERTEBRAL ARTERIES, PARASPINAL TISSUES: No MR findings of ligamentous  injury. Vertebral artery flow voids present. Included posterior fossa and paraspinal soft tissues are normal. DISC LEVELS: C2-3: No disc bulge. Uncovertebral hypertrophy and severe facet arthropathy without canal stenosis. Moderate to severe RIGHT neural foraminal narrowing. C3-4: Uncovertebral hypertrophy. Moderate to severe bilateral facet arthropathy. No canal stenosis. Moderate to severe LEFT neural foraminal narrowing. C4-5: Anterolisthesis. Severe RIGHT and moderate LEFT facet arthropathy with trace RIGHT facet effusion. No canal stenosis or neural foraminal narrowing. C5-6, C6-7: No disc bulge, canal stenosis nor neural foraminal narrowing. C7-T1: No disc bulge. Mild facet arthropathy. No canal stenosis. Mild LEFT neural foraminal narrowing. IMPRESSION: MRI HEAD: 1. Patchy acute nonhemorrhagic infarcts LEFT frontoparietal/MCA territories. Subacute subcentimeter LEFT thalamus infarct. 2. Old RIGHT basal ganglia and old RIGHT thalamus small infarcts. Severe chronic small vessel ischemic changes. MRI CERVICAL SPINE: 1. Progressed degenerative change of the cervical spine, grade 1 C4-5 anterolisthesis. 2. No canal stenosis. Moderate to severe C2-3 and C3-4 neural foraminal narrowing. Electronically Signed   By: Elon Alas M.D.   On: 06/09/2017 18:59   Mr Cervical Spine Wo Contrast  Result Date: 06/09/2017 CLINICAL DATA:  Generalized numbness beginning in upper extremity since this morning, weakness. Recent viral illness, on prednisone. EXAM: MRI HEAD WITHOUT CONTRAST MRI CERVICAL SPINE WITHOUT CONTRAST TECHNIQUE: Multiplanar, multiecho pulse sequences of the brain and surrounding structures, and cervical spine, to include the craniocervical junction and cervicothoracic junction, were obtained without intravenous contrast. COMPARISON:  CT HEAD Jun 09, 2017 and MRI of the cervical spine March 23, 2017 and MRI of the head April 22, 2012 FINDINGS: MRI HEAD FINDINGS INTRACRANIAL CONTENTS: Patchy reduced  diffusion LEFT frontoparietal lobes with low ADC values. Subcentimeter reduced diffusion LEFT thalamus with normalized ADC values. No susceptibility artifact to suggest hemorrhage. Confluent supratentorial white matter FLAIR T2 hyperintensities. Patchy pontine T2 hyperintensities. Old RIGHT basal ganglia and RIGHT thalamus cystic small infarcts. Hazy T2 hyperintensities bilateral basal ganglia and bilateral thalami seen with chronic small vessel ischemic changes. No parenchymal brain volume loss for age. No midline shift,  mass effect or masses. VASCULAR: Normal major intracranial vascular flow voids present at skull base. SKULL AND UPPER CERVICAL SPINE: No abnormal sellar expansion. No suspicious calvarial bone marrow signal. Craniocervical junction maintained. SINUSES/ORBITS: The mastoid air-cells and included paranasal sinuses are well-aerated.The included ocular globes and orbital contents are non-suspicious. OTHER: None. MRI CERVICAL SPINE FINDINGS ALIGNMENT: Straightened cervical lordosis. Minimal grade 1 C4-5 anterolisthesis. VERTEBRAE/DISCS: Vertebral bodies are intact. Intervertebral disc and demonstrate normal morphology with mild desiccation. No abnormal or acute bone marrow signal. CORD:Cervical spinal cord is normal morphology and signal characteristics from the cervicomedullary junction to level of T2-3, the most caudal well visualized level. POSTERIOR FOSSA, VERTEBRAL ARTERIES, PARASPINAL TISSUES: No MR findings of ligamentous injury. Vertebral artery flow voids present. Included posterior fossa and paraspinal soft tissues are normal. DISC LEVELS: C2-3: No disc bulge. Uncovertebral hypertrophy and severe facet arthropathy without canal stenosis. Moderate to severe RIGHT neural foraminal narrowing. C3-4: Uncovertebral hypertrophy. Moderate to severe bilateral facet arthropathy. No canal stenosis. Moderate to severe LEFT neural foraminal narrowing. C4-5: Anterolisthesis. Severe RIGHT and moderate LEFT  facet arthropathy with trace RIGHT facet effusion. No canal stenosis or neural foraminal narrowing. C5-6, C6-7: No disc bulge, canal stenosis nor neural foraminal narrowing. C7-T1: No disc bulge. Mild facet arthropathy. No canal stenosis. Mild LEFT neural foraminal narrowing. IMPRESSION: MRI HEAD: 1. Patchy acute nonhemorrhagic infarcts LEFT frontoparietal/MCA territories. Subacute subcentimeter LEFT thalamus infarct. 2. Old RIGHT basal ganglia and old RIGHT thalamus small infarcts. Severe chronic small vessel ischemic changes. MRI CERVICAL SPINE: 1. Progressed degenerative change of the cervical spine, grade 1 C4-5 anterolisthesis. 2. No canal stenosis. Moderate to severe C2-3 and C3-4 neural foraminal narrowing. Electronically Signed   By: Elon Alas M.D.   On: 06/09/2017 18:59   Dg Chest Port 1 View  Result Date: 06/09/2017 CLINICAL DATA:  Weakness EXAM: PORTABLE CHEST 1 VIEW COMPARISON:  01/02/2017 FINDINGS: Porta catheter on the right with tip at the upper cavoatrial junction. Borderline heart size. Chronic right diaphragm elevation with scarring at the right base. There is no edema, consolidation, effusion, or pneumothorax. No acute osseous finding. Distal right clavicle resection. IMPRESSION: Stable from prior.  No evidence of active disease. Electronically Signed   By: Monte Fantasia M.D.   On: 06/09/2017 16:13    Procedures Procedures (including critical care time)  Medications Ordered in ED Medications  LORazepam (ATIVAN) injection 0.5 mg (0.5 mg Intravenous Refused 06/09/17 1747)  iopamidol (ISOVUE-370) 76 % injection 100 mL (80 mLs Intravenous Contrast Given 06/09/17 2018)     Initial Impression / Assessment and Plan / ED Course  I have reviewed the triage vital signs and the nursing notes.  Pertinent labs & imaging results that were available during my care of the patient were reviewed by me and considered in my medical decision making (see chart for details).     Discussed  CT results with Dr. Leonel Ramsay.  Recommends MRI.  MRI with multiple acute strokes in the left frontoparietal region.  Discussed with Dr. Malen Gauze.  Asked to have CT angios head and neck performed while in the emergency department.  Recommends transfer to [facility] for admission.  Hospitalist will see in the emergency department and arrange transfer. Final Clinical Impressions(s) / ED Diagnoses   Final diagnoses:  Neurologic deficit due to acute ischemic stroke [facility])    ED Discharge Orders    None       Julianne Rice, MD 06/09/17 2046

## 2017-06-09 NOTE — ED Notes (Signed)
Patient transported to CT 

## 2017-06-09 NOTE — H&P (Signed)
History and Physical    [name] [name] XAJ:[id number] DOB: 07-16-1940 DOA: 06/09/2017  PCP: [name], MD   Patient coming from: Home   Chief Complaint: Numbness, change in speech   HPI: [name] [name] is a 77 y.o. female with medical history significant for HTN, polymyositis, diastolic CHF, AVM of the colon, presented to the [facility] ED today with complaints of numbness that started at ~5 a.m this morning, started in the upper extremities, but later reports numbness in upper and lower extremities.  At the time of my evaluation patient reports numbness has resolved.  Patient has chronic bilateral upper extremity weakness from bilateral shoulder problems, also is wheelchair-bound, so not exactly able to tell if she has associated weakness of her extremities.  Patient's son who is patient primary care giver also reported change in speech-speech slowed but not slurred. Patient is not on daily aspirin.  Patient also reports cough-productive of whitish sputum of 2 weeks duration, with associated nasal congestion.   ED Course: temp 97.4, pressure elevated systolic 094B to 096G, heart rate 50s to 60s.  Globin at baseline 9.6 WBC 7.4, unremarkable CMP, Mag 1.6, chronically elevated CK 581, troponin negative, UA- trace leuks.  Portable chest x-ray negative for acute abnormality, CT head without contrast-hypodensity right internal capsule which may represents subacute lacunar infarct.  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  Neurology was consulted- recs for CT angios and admission to [facility] for CVA workup.  Review of Systems: As per HPI otherwise 10 point review of systems negative.   Past Medical History:  Diagnosis Date  . Allergic rhinitis, cause unspecified   . Benign neoplasm of colon   . Esophageal reflux   . Excessive daytime sleepiness   . 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 GI hemorrhage  2011   recurrent  . Hyperlipidemia   . Osteoporosis   . Polymyositis ([place])   . Unspecified essential hypertension   . Vitamin D deficiency     Past Surgical History:  Procedure Laterality Date  . ABDOMINAL HYSTERECTOMY     partial  . bilateral  rotator cuff surgery    . COLONOSCOPY  01/09/2011   others also  . COLONOSCOPY  05/29/2011   Procedure: COLONOSCOPY;  Surgeon: Gatha Mayer, MD;  Location: [facility] ENDOSCOPY;  Service: Endoscopy;  Laterality: N/A;  Greggory Brandy Carlean Purl  . COLONOSCOPY WITH PROPOFOL N/A 02/16/2014   Procedure: COLONOSCOPY WITH PROPOFOL;  Surgeon: Milus Banister, MD;  Location: [facility] ENDOSCOPY;  Service: Endoscopy;  Laterality: N/A;  . ESOPHAGOGASTRODUODENOSCOPY  12/08/2010   others also  . ESOPHAGOGASTRODUODENOSCOPY (EGD) WITH PROPOFOL N/A 02/16/2014   Procedure: ESOPHAGOGASTRODUODENOSCOPY (EGD) WITH PROPOFOL;  Surgeon: Milus Banister, MD;  Location: [facility] ENDOSCOPY;  Service: Endoscopy;  Laterality: N/A;  . EUS    . HOT HEMOSTASIS  05/29/2011   Procedure: HOT HEMOSTASIS (ARGON PLASMA COAGULATION/BICAP);  Surgeon: Gatha Mayer, MD;  Location: [facility];  Service: Endoscopy;  Laterality: N/A;  . LUMBAR LAMINECTOMY    . TONSILLECTOMY  age 12     reports that she has never smoked. She has quit using smokeless tobacco. Her smokeless tobacco use included snuff. She reports that she does not drink alcohol or use drugs.  Allergies  Allergen Reactions  . Codeine Other (See Comments)    Feels drunk  . Hydrocodone     "makes me sleepy"  . Tramadol Hcl Other (See Comments)    Makes  me feel drunk    Family History  Problem Relation Age of Onset  . Dementia Mother   . Hypertension Father   . Malignant hyperthermia Father   . Colon cancer Neg Hx     Prior to Admission medications   Medication Sig Start Date End Date Taking? Authorizing Provider  allopurinol (ZYLOPRIM) 300 MG tablet Take 300 mg by mouth daily. 10/11/16  Yes [provider]  Ascorbic Acid (VITAMIN  C) 1000 MG tablet Take 1,000 mg by mouth daily.    Yes [provider]  atenolol (TENORMIN) 50 MG tablet Take 50 mg by mouth daily.     Yes [provider]  Calcium Carbonate (CALTRATE 600) 1500 MG TABS Take 2 tablets by mouth daily.    Yes [provider]  Cholecalciferol (VITAMIN D PO) Take 2 tablets by mouth daily.    Yes [provider]  DUREZOL 0.05 % EMUL Place 1 drop into both eyes daily. 05/13/17  Yes [provider]  esomeprazole (NEXIUM) 40 MG capsule Take one capsule by thirty minutes before breakfast Take one capsule by thirty minutes before dinner Patient taking differently: Take 40 mg by mouth 2 (two) times daily before a meal. Take one capsule by thirty minutes before breakfast Take one capsule by thirty minutes before dinner 05/11/13  Yes Zehr, Laban Emperor, PA-C  folic acid (FOLVITE) 169 MCG tablet Take 400 mcg by mouth daily.     Yes [provider]  furosemide (LASIX) 40 MG tablet Take 40 mg by mouth daily.     Yes [provider]  mycophenolate (CELLCEPT) 500 MG tablet 500 mg TWICE DAILY 03/29/15  Yes [provider]  omega-3 acid ethyl esters (LOVAZA) 1 g capsule Take 2 g by mouth 2 (two) times daily.  10/09/16  Yes [provider]  OVER THE COUNTER MEDICATION Take 1 capsule by mouth daily. Beet Root   Yes [provider]  PAPAYA PO Take 1 tablet by mouth daily.   Yes [provider]  potassium chloride SA (K-DUR,KLOR-CON) 20 MEQ tablet Take 2 tablets (40 mEq total) by mouth 2 (two) times daily. 05/15/17  Yes Nanavati, Ankit, MD  predniSONE (DELTASONE) 20 MG tablet TK 1 T PO ONCE D FOR 6 DAYS 06/03/17  Yes [provider]  PROLENSA 0.07 % SOLN Place 1 drop into both eyes daily. As directed 05/13/17  Yes [provider]  psyllium (HYDROCIL/METAMUCIL) 95 % PACK Take 1 packet by mouth daily. Patient taking differently: Take 1 packet by mouth daily as needed (gas).  05/15/11  Yes  [name], MD  ULORIC 40 MG tablet Take 40 mg by mouth daily. 06/20/16  Yes [provider]  VITAMIN A PO Take 1 tablet by mouth daily.   Yes [provider]  vitamin B-12 (CYANOCOBALAMIN) 1000 MCG tablet Take 1,000 mcg by mouth daily.     Yes [provider]  VITAMIN E PO Take 1 tablet by mouth.   Yes [provider]  acetaminophen (TYLENOL 8 HOUR) 650 MG CR tablet Take 1 tablet (650 mg total) by mouth every 8 (eight) hours as needed. Patient taking differently: Take 650 mg by mouth every 8 (eight) hours as needed for pain.  05/15/17   Varney Biles, MD  ALPRAZolam Duanne Moron) 0.25 MG tablet Take 0.25 mg by mouth 2 (two) times daily as needed for anxiety or sleep.     [provider]  benzonatate (TESSALON) 100 MG capsule TK 1 C PO  TID PRN COUGH 05/26/17   [provider]  CARAFATE 1 GM/10ML suspension Take 1 g by mouth 2 (two) times daily as needed (GAS/INDIGESTION).  11/25/16   [provider]  colchicine 0.6 MG tablet Take 0.6 mg by mouth. Take 1 tablet by mouth every 8 hours as needed for gout flare 10/13/16   [provider]  fluticasone (FLONASE) 50 MCG/ACT nasal spray Place 1 spray into both nostrils daily as needed for allergies.  06/05/17   [provider]  gabapentin (NEURONTIN) 100 MG capsule TAKE ONE CAPSULE BY MOUTH TWICE DAILY Patient taking differently: TAKE ONE CAPSULE BY MOUTH TWICE DAILY AS NEEDED FOR NERVE PAIN 04/14/17   Wallene Huh, DPM  loratadine (CLARITIN) 10 MG tablet Take 10 mg by mouth daily as needed. For allergies.    [provider]  sodium chloride (OCEAN) 0.65 % nasal spray Place 1 spray into the nose at bedtime as needed for congestion (allergies).     [provider]    Physical Exam: Vitals:   06/09/17 1945 06/09/17 2127 06/09/17 2130 06/09/17 2200  BP: (!) 170/76 (!) 150/86 (!) 164/73 (!) 158/82  Pulse:      Resp: 19 19 16  (!) 22  Temp:      TempSrc:        SpO2:      Weight:      Height:        Constitutional: NAD, calm, comfortable Vitals:   06/09/17 1945 06/09/17 2127 06/09/17 2130 06/09/17 2200  BP: (!) 170/76 (!) 150/86 (!) 164/73 (!) 158/82  Pulse:      Resp: 19 19 16  (!) 22  Temp:      TempSrc:      SpO2:      Weight:      Height:       Eyes: PERRL, lids and conjunctivae normal ENMT: Mucous membranes are moist. Posterior pharynx clear of any exudate or lesions.Normal dentition.  Neck: normal, supple, no masses, no thyromegaly Respiratory: clear to auscultation bilaterally, no wheezing, no crackles. Normal respiratory effort. No accessory muscle use.  Cardiovascular: Regular rate and rhythm, no murmurs / rubs / gallops. No extremity edema. 2+ pedal pulses. No carotid bruits.  Abdomen: no tenderness, no masses palpated. No hepatosplenomegaly. Bowel sounds positive.  Musculoskeletal: no clubbing / cyanosis. No joint deformity upper and lower extremities. Good ROM, no contractures. Normal muscle tone.  Skin: no rashes, lesions, ulcers. No induration Neurologic: Speech appears slurred but son says this is her baseline, Tongue appears deviated to right, Sensation intact upper and lower extremities,  strength 3/5 bilat upper extremities, bilat lower extremities hip flexors- 3/5, foot flexion and extension- 5/5.  Unable to assess finger-to-nose or heel-to-shin due to extremity weakness. Psychiatric: Normal judgment and insight. Alert and oriented x 3. Normal mood.   Labs on Admission: I have personally reviewed following labs and imaging studies  CBC: Recent Labs  Lab 06/09/17 1648 06/09/17 1656  WBC 7.4  --   NEUTROABS 4.2  --   HGB 9.6* 10.5*  HCT 31.2* 31.0*  MCV 83.9  --   PLT 296  --    Basic Metabolic Panel: Recent Labs  Lab 06/09/17 1648 06/09/17 1656  NA 140 142  K 3.6 3.6  CL 104 105  CO2 25  --   GLUCOSE 84 81  BUN 12 9  CREATININE 0.42* 0.50  CALCIUM 9.2  --   MG 1.6*  --    Liver Function  Tests:  Recent Labs  Lab 06/09/17 1648  AST 24  ALT 18  ALKPHOS 49  BILITOT 0.5  PROT 6.2*  ALBUMIN 3.6   Coagulation Profile: Recent Labs  Lab 06/09/17 1648  INR 1.01   Cardiac Enzymes: Recent Labs  Lab 06/09/17 1648  CKTOTAL 581*   Urine analysis:    Component Value Date/Time   COLORURINE STRAW (A) 06/09/2017 2135   APPEARANCEUR CLEAR 06/09/2017 2135   LABSPEC 1.023 06/09/2017 2135   PHURINE 5.0 06/09/2017 2135   GLUCOSEU NEGATIVE 06/09/2017 2135   HGBUR NEGATIVE 06/09/2017 2135   BILIRUBINUR NEGATIVE 06/09/2017 2135   KETONESUR NEGATIVE 06/09/2017 2135   PROTEINUR NEGATIVE 06/09/2017 2135   UROBILINOGEN 0.2 04/30/2013 1803   NITRITE NEGATIVE 06/09/2017 2135   LEUKOCYTESUR TRACE (A) 06/09/2017 2135    Radiological Exams on Admission: Ct Angio Head W Or Wo Contrast  Result Date: 06/09/2017 CLINICAL DATA:  Generalized numbness beginning in bilateral hands this morning. Follow-up infarct. History of hyperlipidemia. EXAM: CT ANGIOGRAPHY HEAD AND NECK TECHNIQUE: Multidetector CT imaging of the head and neck was performed using the standard protocol during bolus administration of intravenous contrast. Multiplanar CT image reconstructions and MIPs were obtained to evaluate the vascular anatomy. Carotid stenosis measurements (when applicable) are obtained utilizing NASCET criteria, using the distal internal carotid diameter as the denominator. CONTRAST:  73mL ISOVUE-370 IOPAMIDOL (ISOVUE-370) INJECTION 76% COMPARISON:  MRI of the head Jun 09, 2017 FINDINGS: CTA NECK FINDINGS: AORTIC ARCH: Normal appearance of the thoracic arch, normal branch pattern. Mild calcific atherosclerosis. The origins of the innominate, left Common carotid artery and subclavian artery are widely patent. RIGHT CAROTID SYSTEM: Common carotid artery is patent. Normal appearance of the carotid bifurcation without hemodynamically significant stenosis by NASCET criteria. Normal appearance of the internal carotid  artery. LEFT CAROTID SYSTEM: Common carotid artery is patent. Normal appearance of the carotid bifurcation without hemodynamically significant stenosis by NASCET criteria. Normal appearance of the internal carotid artery. VERTEBRAL ARTERIES:Left vertebral artery is dominant. Normal appearance of the vertebral arteries, widely patent. SKELETON: No acute osseous process though bone windows have not been submitted. Minimal grade 1 C4-5 anterolisthesis with multilevel advanced upper cervical facet arthropathy better demonstrated on today's MRI of the cervical spine. OTHER NECK: Soft tissues of the neck are nonacute though, not tailored for evaluation. RIGHT chest Port-A-Cath. UPPER CHEST: Multiple RIGHT upper lobe subsolid pulmonary nodules measuring to 5 mm. No superior mediastinal lymphadenopathy. CTA HEAD FINDINGS: ANTERIOR CIRCULATION: Patent cervical internal carotid arteries, petrous, cavernous and supra clinoid internal carotid arteries. Patent anterior and middle cerebral arteries. Multifocal severe stenosis bilateral A2 and A3 segments. Severe stenosis LEFT M2 inferior division origin. Multifocal severe stenosis bilateral mid to distal MCA including LEFT M3 segment. No large vessel occlusion, contrast extravasation or aneurysm. POSTERIOR CIRCULATION: Patent vertebral arteries, vertebrobasilar junction and basilar artery, as well as main branch vessels. Patent posterior cerebral arteries. Moderate stenosis RIGHT P2 segment. No large vessel occlusion, contrast extravasation or aneurysm. VENOUS SINUSES: Major dural venous sinuses are patent though not tailored for evaluation on this angiographic examination. ANATOMIC VARIANTS: None. DELAYED PHASE: No abnormal intracranial enhancement. MIP images reviewed. IMPRESSION: CTA NECK: 1. No hemodynamically significant stenosis or acute vascular process in the neck. 2. **An incidental finding of potential clinical significance has been found. Multiple RIGHT upper lobe  pulmonary nodules measuring less than 6 mm. Non-contrast chest CT at 3-6 months is recommended. If nodules persist and are stable at that time, consider additional non-contrast chest CT examinations at 2 and  4 years. This recommendation follows the consensus statement: Guidelines for Management of Incidental Pulmonary Nodules Detected on CT Images: From the Fleischner Society 2017; Radiology 2017; 284:228-243.** CTA HEAD: 1. No emergent large vessel occlusion. 2. Multifocal severe stenosis anterior and middle cerebral artery's may be secondary to atherosclerosis or hypertensive vasculopathy. Aortic Atherosclerosis (ICD10-I70.0). Electronically Signed   By: Elon Alas M.D.   On: 06/09/2017 20:54   Ct Head Wo Contrast  Result Date: 06/09/2017 CLINICAL DATA:  Numbness and weakness. EXAM: CT HEAD WITHOUT CONTRAST TECHNIQUE: Contiguous axial images were obtained from the base of the skull through the vertex without intravenous contrast. COMPARISON:  10/22/2016 FINDINGS: Brain: More prominent hypodensity along the anterior limb of the right internal capsule may represent a subacute lacunar infarct. Older adjacent lacunar infarct of the basal ganglia appears stable. Stable advanced small vessel ischemic disease in the periventricular white matter. The brain demonstrates no evidence of hemorrhage, edema, mass effect, extra-axial fluid collection, hydrocephalus or mass lesion. Vascular: No hyperdense vessel or unexpected calcification. Skull: The skull shows osteopenia without evidence of fracture or bony lesions. Sinuses/Orbits: There are air-fluid levels in both visualized maxillary antra. Mucosal thickening also noted in bilateral ethmoid and sphenoid air cells. Other: None. IMPRESSION: 1. More prominent hypodensity along the anterior limb of the right internal capsule may represent a subacute lacunar infarct. Older lacunar infarcts and advanced small vessel disease appears stable. 2. New air-fluid levels in  both maxillary antra as well as mucosal thickening in bilateral ethmoid and sphenoid air cells are suggestive of active sinusitis. Electronically Signed   By: Aletta Edouard M.D.   On: 06/09/2017 15:37   Ct Angio Neck W And/or Wo Contrast  Result Date: 06/09/2017 CLINICAL DATA:  Generalized numbness beginning in bilateral hands this morning. Follow-up infarct. History of hyperlipidemia. EXAM: CT ANGIOGRAPHY HEAD AND NECK TECHNIQUE: Multidetector CT imaging of the head and neck was performed using the standard protocol during bolus administration of intravenous contrast. Multiplanar CT image reconstructions and MIPs were obtained to evaluate the vascular anatomy. Carotid stenosis measurements (when applicable) are obtained utilizing NASCET criteria, using the distal internal carotid diameter as the denominator. CONTRAST:  105mL ISOVUE-370 IOPAMIDOL (ISOVUE-370) INJECTION 76% COMPARISON:  MRI of the head Jun 09, 2017 FINDINGS: CTA NECK FINDINGS: AORTIC ARCH: Normal appearance of the thoracic arch, normal branch pattern. Mild calcific atherosclerosis. The origins of the innominate, left Common carotid artery and subclavian artery are widely patent. RIGHT CAROTID SYSTEM: Common carotid artery is patent. Normal appearance of the carotid bifurcation without hemodynamically significant stenosis by NASCET criteria. Normal appearance of the internal carotid artery. LEFT CAROTID SYSTEM: Common carotid artery is patent. Normal appearance of the carotid bifurcation without hemodynamically significant stenosis by NASCET criteria. Normal appearance of the internal carotid artery. VERTEBRAL ARTERIES:Left vertebral artery is dominant. Normal appearance of the vertebral arteries, widely patent. SKELETON: No acute osseous process though bone windows have not been submitted. Minimal grade 1 C4-5 anterolisthesis with multilevel advanced upper cervical facet arthropathy better demonstrated on today's MRI of the cervical spine. OTHER  NECK: Soft tissues of the neck are nonacute though, not tailored for evaluation. RIGHT chest Port-A-Cath. UPPER CHEST: Multiple RIGHT upper lobe subsolid pulmonary nodules measuring to 5 mm. No superior mediastinal lymphadenopathy. CTA HEAD FINDINGS: ANTERIOR CIRCULATION: Patent cervical internal carotid arteries, petrous, cavernous and supra clinoid internal carotid arteries. Patent anterior and middle cerebral arteries. Multifocal severe stenosis bilateral A2 and A3 segments. Severe stenosis LEFT M2 inferior division origin. Multifocal severe stenosis bilateral  mid to distal MCA including LEFT M3 segment. No large vessel occlusion, contrast extravasation or aneurysm. POSTERIOR CIRCULATION: Patent vertebral arteries, vertebrobasilar junction and basilar artery, as well as main branch vessels. Patent posterior cerebral arteries. Moderate stenosis RIGHT P2 segment. No large vessel occlusion, contrast extravasation or aneurysm. VENOUS SINUSES: Major dural venous sinuses are patent though not tailored for evaluation on this angiographic examination. ANATOMIC VARIANTS: None. DELAYED PHASE: No abnormal intracranial enhancement. MIP images reviewed. IMPRESSION: CTA NECK: 1. No hemodynamically significant stenosis or acute vascular process in the neck. 2. **An incidental finding of potential clinical significance has been found. Multiple RIGHT upper lobe pulmonary nodules measuring less than 6 mm. Non-contrast chest CT at 3-6 months is recommended. If nodules persist and are stable at that time, consider additional non-contrast chest CT examinations at 2 and 4 years. This recommendation follows the consensus statement: Guidelines for Management of Incidental Pulmonary Nodules Detected on CT Images: From the Fleischner Society 2017; Radiology 2017; 284:228-243.** CTA HEAD: 1. No emergent large vessel occlusion. 2. Multifocal severe stenosis anterior and middle cerebral artery's may be secondary to atherosclerosis or  hypertensive vasculopathy. Aortic Atherosclerosis (ICD10-I70.0). Electronically Signed   By: Elon Alas M.D.   On: 06/09/2017 20:54   Mr Brain Wo Contrast  Result Date: 06/09/2017 CLINICAL DATA:  Generalized numbness beginning in upper extremity since this morning, weakness. Recent viral illness, on prednisone. EXAM: MRI HEAD WITHOUT CONTRAST MRI CERVICAL SPINE WITHOUT CONTRAST TECHNIQUE: Multiplanar, multiecho pulse sequences of the brain and surrounding structures, and cervical spine, to include the craniocervical junction and cervicothoracic junction, were obtained without intravenous contrast. COMPARISON:  CT HEAD Jun 09, 2017 and MRI of the cervical spine March 23, 2017 and MRI of the head April 22, 2012 FINDINGS: MRI HEAD FINDINGS INTRACRANIAL CONTENTS: Patchy reduced diffusion LEFT frontoparietal lobes with low ADC values. Subcentimeter reduced diffusion LEFT thalamus with normalized ADC values. No susceptibility artifact to suggest hemorrhage. Confluent supratentorial white matter FLAIR T2 hyperintensities. Patchy pontine T2 hyperintensities. Old RIGHT basal ganglia and RIGHT thalamus cystic small infarcts. Hazy T2 hyperintensities bilateral basal ganglia and bilateral thalami seen with chronic small vessel ischemic changes. No parenchymal brain volume loss for age. No midline shift, mass effect or masses. VASCULAR: Normal major intracranial vascular flow voids present at skull base. SKULL AND UPPER CERVICAL SPINE: No abnormal sellar expansion. No suspicious calvarial bone marrow signal. Craniocervical junction maintained. SINUSES/ORBITS: The mastoid air-cells and included paranasal sinuses are well-aerated.The included ocular globes and orbital contents are non-suspicious. OTHER: None. MRI CERVICAL SPINE FINDINGS ALIGNMENT: Straightened cervical lordosis. Minimal grade 1 C4-5 anterolisthesis. VERTEBRAE/DISCS: Vertebral bodies are intact. Intervertebral disc and demonstrate normal morphology with  mild desiccation. No abnormal or acute bone marrow signal. CORD:Cervical spinal cord is normal morphology and signal characteristics from the cervicomedullary junction to level of T2-3, the most caudal well visualized level. POSTERIOR FOSSA, VERTEBRAL ARTERIES, PARASPINAL TISSUES: No MR findings of ligamentous injury. Vertebral artery flow voids present. Included posterior fossa and paraspinal soft tissues are normal. DISC LEVELS: C2-3: No disc bulge. Uncovertebral hypertrophy and severe facet arthropathy without canal stenosis. Moderate to severe RIGHT neural foraminal narrowing. C3-4: Uncovertebral hypertrophy. Moderate to severe bilateral facet arthropathy. No canal stenosis. Moderate to severe LEFT neural foraminal narrowing. C4-5: Anterolisthesis. Severe RIGHT and moderate LEFT facet arthropathy with trace RIGHT facet effusion. No canal stenosis or neural foraminal narrowing. C5-6, C6-7: No disc bulge, canal stenosis nor neural foraminal narrowing. C7-T1: No disc bulge. Mild facet arthropathy. No canal stenosis. Mild LEFT  neural foraminal narrowing. IMPRESSION: MRI HEAD: 1. Patchy acute nonhemorrhagic infarcts LEFT frontoparietal/MCA territories. Subacute subcentimeter LEFT thalamus infarct. 2. Old RIGHT basal ganglia and old RIGHT thalamus small infarcts. Severe chronic small vessel ischemic changes. MRI CERVICAL SPINE: 1. Progressed degenerative change of the cervical spine, grade 1 C4-5 anterolisthesis. 2. No canal stenosis. Moderate to severe C2-3 and C3-4 neural foraminal narrowing. Electronically Signed   By: Elon Alas M.D.   On: 06/09/2017 18:59   Mr Cervical Spine Wo Contrast  Result Date: 06/09/2017 CLINICAL DATA:  Generalized numbness beginning in upper extremity since this morning, weakness. Recent viral illness, on prednisone. EXAM: MRI HEAD WITHOUT CONTRAST MRI CERVICAL SPINE WITHOUT CONTRAST TECHNIQUE: Multiplanar, multiecho pulse sequences of the brain and surrounding structures, and  cervical spine, to include the craniocervical junction and cervicothoracic junction, were obtained without intravenous contrast. COMPARISON:  CT HEAD Jun 09, 2017 and MRI of the cervical spine March 23, 2017 and MRI of the head April 22, 2012 FINDINGS: MRI HEAD FINDINGS INTRACRANIAL CONTENTS: Patchy reduced diffusion LEFT frontoparietal lobes with low ADC values. Subcentimeter reduced diffusion LEFT thalamus with normalized ADC values. No susceptibility artifact to suggest hemorrhage. Confluent supratentorial white matter FLAIR T2 hyperintensities. Patchy pontine T2 hyperintensities. Old RIGHT basal ganglia and RIGHT thalamus cystic small infarcts. Hazy T2 hyperintensities bilateral basal ganglia and bilateral thalami seen with chronic small vessel ischemic changes. No parenchymal brain volume loss for age. No midline shift, mass effect or masses. VASCULAR: Normal major intracranial vascular flow voids present at skull base. SKULL AND UPPER CERVICAL SPINE: No abnormal sellar expansion. No suspicious calvarial bone marrow signal. Craniocervical junction maintained. SINUSES/ORBITS: The mastoid air-cells and included paranasal sinuses are well-aerated.The included ocular globes and orbital contents are non-suspicious. OTHER: None. MRI CERVICAL SPINE FINDINGS ALIGNMENT: Straightened cervical lordosis. Minimal grade 1 C4-5 anterolisthesis. VERTEBRAE/DISCS: Vertebral bodies are intact. Intervertebral disc and demonstrate normal morphology with mild desiccation. No abnormal or acute bone marrow signal. CORD:Cervical spinal cord is normal morphology and signal characteristics from the cervicomedullary junction to level of T2-3, the most caudal well visualized level. POSTERIOR FOSSA, VERTEBRAL ARTERIES, PARASPINAL TISSUES: No MR findings of ligamentous injury. Vertebral artery flow voids present. Included posterior fossa and paraspinal soft tissues are normal. DISC LEVELS: C2-3: No disc bulge. Uncovertebral hypertrophy and  severe facet arthropathy without canal stenosis. Moderate to severe RIGHT neural foraminal narrowing. C3-4: Uncovertebral hypertrophy. Moderate to severe bilateral facet arthropathy. No canal stenosis. Moderate to severe LEFT neural foraminal narrowing. C4-5: Anterolisthesis. Severe RIGHT and moderate LEFT facet arthropathy with trace RIGHT facet effusion. No canal stenosis or neural foraminal narrowing. C5-6, C6-7: No disc bulge, canal stenosis nor neural foraminal narrowing. C7-T1: No disc bulge. Mild facet arthropathy. No canal stenosis. Mild LEFT neural foraminal narrowing. IMPRESSION: MRI HEAD: 1. Patchy acute nonhemorrhagic infarcts LEFT frontoparietal/MCA territories. Subacute subcentimeter LEFT thalamus infarct. 2. Old RIGHT basal ganglia and old RIGHT thalamus small infarcts. Severe chronic small vessel ischemic changes. MRI CERVICAL SPINE: 1. Progressed degenerative change of the cervical spine, grade 1 C4-5 anterolisthesis. 2. No canal stenosis. Moderate to severe C2-3 and C3-4 neural foraminal narrowing. Electronically Signed   By: Elon Alas M.D.   On: 06/09/2017 18:59   Dg Chest Port 1 View  Result Date: 06/09/2017 CLINICAL DATA:  Weakness EXAM: PORTABLE CHEST 1 VIEW COMPARISON:  01/02/2017 FINDINGS: Porta catheter on the right with tip at the upper cavoatrial junction. Borderline heart size. Chronic right diaphragm elevation with scarring at the right base. There is no edema,  consolidation, effusion, or pneumothorax. No acute osseous finding. Distal right clavicle resection. IMPRESSION: Stable from prior.  No evidence of active disease. Electronically Signed   By: Monte Fantasia M.D.   On: 06/09/2017 16:13    EKG: Independently reviewed.   Assessment/Plan Active Problems:   Essential hypertension   Polymyositis (HCC)   Chronic diastolic CHF (congestive heart failure) (HCC)   CVA (cerebral vascular accident) ([place])   Multiple pulmonary nodules   Numbness- Resolved. MRI- Patchy  acute non-hemorrhagic infarcts left Frontoparietal/MCA territories, subacute subcentimeter left thalamus infarct.  Old right basal ganglia old right thalamic small infarcts.  Chronic bilateral upper lower extremity weakness. wheelchair-bound at baseline.  Not on home aspirin. - Aspirin 325 X 1 , 81 mg daily - Hold antihypertensives to allow for permissive hypertension in the setting of recent CVA -Neurology consulted f/u recs - CTA head W/Wo contrast- ordered per neuro recs-no emergent large vessel occlusion, multifocal severe stenosis anterior medial cerebral arteries-atherosclerosis or hypertensive vasculopathy. - No carotid Dopplers, as CTA head and neck done - Echo - Lipid, Hgba1c - Swallow eval, PT, OT  Polymyositis-CK chronically elevated.  Reports she follows with a rheumatologist Dr. Elpidio Galea. -Continue home mycophenolate -Continue home Uloric  Diastolic CHF-no echo on file.  Home medications Lasix 40 daily.  Appears euvolemic. -Hold home Lasix at this time -Echo - Mag- 1.6, replete.  Multiple pulmonary nodules- CTA head and neck-multiple right upper lobe pulmonary nodules measuring less than 6 mm.  Noncontrast CT chest recommended 3 to 6 months. -Follow-up outpatient  HTN-pressure elevated up to 180s.  Hold home medications atenolol. -Allow for permissive hypertension in the setting of acute CVA -PRN hydralazine  DVT prophylaxis: Lovenox Code Status: Full Family Communication: Son Land at bedside Disposition Plan: Per rounding team Consults called: Neurology Admission status: Obs, tele   Bethena Roys MD [facility] Pager [phone] From 6PM-2AM.  Otherwise please contact night-coverage [email or web address] Password [facility]  06/09/2017, 10:28 PM

## 2017-06-09 NOTE — ED Notes (Signed)
Patient transported to MRI 

## 2017-06-09 NOTE — ED Notes (Signed)
Darren--son-- [phone] c [phone]--pt gave verbal permission and request that Darren be called when pt goes upstairs so that he knows where to go tomorrow

## 2017-06-09 NOTE — ED Notes (Signed)
Hospitalist at bedside 

## 2017-06-10 ENCOUNTER — Observation Stay (HOSPITAL_BASED_OUTPATIENT_CLINIC_OR_DEPARTMENT_OTHER): Payer: Medicare Other

## 2017-06-10 ENCOUNTER — Observation Stay (HOSPITAL_COMMUNITY): Payer: Medicare Other

## 2017-06-10 ENCOUNTER — Encounter (HOSPITAL_COMMUNITY): Payer: Self-pay

## 2017-06-10 DIAGNOSIS — I10 Essential (primary) hypertension: Secondary | ICD-10-CM

## 2017-06-10 DIAGNOSIS — I340 Nonrheumatic mitral (valve) insufficiency: Secondary | ICD-10-CM | POA: Diagnosis not present

## 2017-06-10 DIAGNOSIS — R4701 Aphasia: Secondary | ICD-10-CM | POA: Diagnosis not present

## 2017-06-10 DIAGNOSIS — D62 Acute posthemorrhagic anemia: Secondary | ICD-10-CM | POA: Diagnosis not present

## 2017-06-10 DIAGNOSIS — E785 Hyperlipidemia, unspecified: Secondary | ICD-10-CM | POA: Diagnosis not present

## 2017-06-10 DIAGNOSIS — M3320 Polymyositis, organ involvement unspecified: Secondary | ICD-10-CM | POA: Diagnosis not present

## 2017-06-10 DIAGNOSIS — I6300 Cerebral infarction due to thrombosis of unspecified precerebral artery: Secondary | ICD-10-CM | POA: Diagnosis not present

## 2017-06-10 DIAGNOSIS — I672 Cerebral atherosclerosis: Secondary | ICD-10-CM | POA: Diagnosis not present

## 2017-06-10 DIAGNOSIS — I639 Cerebral infarction, unspecified: Secondary | ICD-10-CM | POA: Diagnosis not present

## 2017-06-10 DIAGNOSIS — I63519 Cerebral infarction due to unspecified occlusion or stenosis of unspecified middle cerebral artery: Secondary | ICD-10-CM | POA: Diagnosis not present

## 2017-06-10 DIAGNOSIS — R402362 Coma scale, best motor response, obeys commands, at arrival to emergency department: Secondary | ICD-10-CM | POA: Diagnosis not present

## 2017-06-10 DIAGNOSIS — K5520 Angiodysplasia of colon without hemorrhage: Secondary | ICD-10-CM | POA: Diagnosis not present

## 2017-06-10 DIAGNOSIS — E876 Hypokalemia: Secondary | ICD-10-CM | POA: Diagnosis not present

## 2017-06-10 DIAGNOSIS — I361 Nonrheumatic tricuspid (valve) insufficiency: Secondary | ICD-10-CM | POA: Diagnosis not present

## 2017-06-10 DIAGNOSIS — R402142 Coma scale, eyes open, spontaneous, at arrival to emergency department: Secondary | ICD-10-CM | POA: Diagnosis not present

## 2017-06-10 DIAGNOSIS — R29818 Other symptoms and signs involving the nervous system: Secondary | ICD-10-CM

## 2017-06-10 DIAGNOSIS — I5032 Chronic diastolic (congestive) heart failure: Secondary | ICD-10-CM

## 2017-06-10 DIAGNOSIS — R918 Other nonspecific abnormal finding of lung field: Secondary | ICD-10-CM

## 2017-06-10 DIAGNOSIS — I110 Hypertensive heart disease with heart failure: Secondary | ICD-10-CM | POA: Diagnosis not present

## 2017-06-10 DIAGNOSIS — I63032 Cerebral infarction due to thrombosis of left carotid artery: Secondary | ICD-10-CM

## 2017-06-10 DIAGNOSIS — E559 Vitamin D deficiency, unspecified: Secondary | ICD-10-CM | POA: Diagnosis not present

## 2017-06-10 DIAGNOSIS — R29713 NIHSS score 13: Secondary | ICD-10-CM | POA: Diagnosis not present

## 2017-06-10 DIAGNOSIS — Q2730 Arteriovenous malformation, site unspecified: Secondary | ICD-10-CM | POA: Diagnosis not present

## 2017-06-10 DIAGNOSIS — J069 Acute upper respiratory infection, unspecified: Secondary | ICD-10-CM | POA: Diagnosis not present

## 2017-06-10 DIAGNOSIS — G14 Postpolio syndrome: Secondary | ICD-10-CM | POA: Diagnosis not present

## 2017-06-10 DIAGNOSIS — R531 Weakness: Secondary | ICD-10-CM | POA: Diagnosis present

## 2017-06-10 DIAGNOSIS — Z993 Dependence on wheelchair: Secondary | ICD-10-CM | POA: Diagnosis not present

## 2017-06-10 DIAGNOSIS — G8250 Quadriplegia, unspecified: Secondary | ICD-10-CM | POA: Diagnosis not present

## 2017-06-10 DIAGNOSIS — R402252 Coma scale, best verbal response, oriented, at arrival to emergency department: Secondary | ICD-10-CM | POA: Diagnosis not present

## 2017-06-10 DIAGNOSIS — Z8673 Personal history of transient ischemic attack (TIA), and cerebral infarction without residual deficits: Secondary | ICD-10-CM | POA: Diagnosis not present

## 2017-06-10 DIAGNOSIS — M810 Age-related osteoporosis without current pathological fracture: Secondary | ICD-10-CM | POA: Diagnosis not present

## 2017-06-10 DIAGNOSIS — K219 Gastro-esophageal reflux disease without esophagitis: Secondary | ICD-10-CM | POA: Diagnosis not present

## 2017-06-10 DIAGNOSIS — Z87891 Personal history of nicotine dependence: Secondary | ICD-10-CM | POA: Diagnosis not present

## 2017-06-10 DIAGNOSIS — Z8249 Family history of ischemic heart disease and other diseases of the circulatory system: Secondary | ICD-10-CM | POA: Diagnosis not present

## 2017-06-10 LAB — ECHOCARDIOGRAM COMPLETE
Height: 63 in
Weight: 2720 oz

## 2017-06-10 LAB — LIPID PANEL
Cholesterol: 299 mg/dL — ABNORMAL HIGH (ref 0–200)
HDL: 66 mg/dL (ref >40)
LDL Cholesterol: 164 mg/dL — ABNORMAL HIGH (ref 0–99)
TRIGLYCERIDES: 347 mg/dL — AB (ref <150)
Total CHOL/HDL Ratio: 4.5 RATIO
VLDL: 69 mg/dL — ABNORMAL HIGH (ref 0–40)

## 2017-06-10 MED ORDER — STROKE: EARLY STAGES OF RECOVERY BOOK
Freq: Once | Status: AC
  Administered 2017-06-10: 05:00:00

## 2017-06-10 MED ORDER — ASPIRIN 81 MG PO CHEW
81.0000 mg | CHEWABLE_TABLET | Freq: Every day | ORAL | Status: DC
  Administered 2017-06-10 – 2017-06-12 (×3): 81 mg via ORAL
  Filled 2017-06-10 (×3): qty 1

## 2017-06-10 MED ORDER — ENOXAPARIN SODIUM 40 MG/0.4ML ~~LOC~~ SOLN
40.0000 mg | SUBCUTANEOUS | Status: DC
  Administered 2017-06-10 – 2017-06-12 (×3): 40 mg via SUBCUTANEOUS
  Filled 2017-06-10 (×3): qty 0.4

## 2017-06-10 MED ORDER — ENSURE ENLIVE PO LIQD
237.0000 mL | Freq: Two times a day (BID) | ORAL | Status: DC
  Administered 2017-06-11 (×2): 237 mL via ORAL

## 2017-06-10 MED ORDER — FEBUXOSTAT 40 MG PO TABS
40.0000 mg | ORAL_TABLET | Freq: Every day | ORAL | Status: DC
  Administered 2017-06-10 – 2017-06-12 (×3): 40 mg via ORAL
  Filled 2017-06-10 (×3): qty 1

## 2017-06-10 MED ORDER — ALPRAZOLAM 0.25 MG PO TABS: 0.2500 mg | ORAL_TABLET | Freq: Two times a day (BID) | ORAL | Status: DC | PRN

## 2017-06-10 MED ORDER — FLUTICASONE PROPIONATE 50 MCG/ACT NA SUSP
1.0000 | Freq: Every day | NASAL | Status: DC | PRN
  Administered 2017-06-11: 1 via NASAL
  Filled 2017-06-10: qty 16

## 2017-06-10 MED ORDER — SENNOSIDES-DOCUSATE SODIUM 8.6-50 MG PO TABS: 1.0000 | ORAL_TABLET | Freq: Every evening | ORAL | Status: DC | PRN

## 2017-06-10 MED ORDER — HEPARIN SOD (PORK) LOCK FLUSH 100 UNIT/ML IV SOLN
500.0000 [IU] | INTRAVENOUS | Status: DC
  Administered 2017-06-10: 500 [IU]
  Filled 2017-06-10: qty 5

## 2017-06-10 MED ORDER — ACETAMINOPHEN 160 MG/5ML PO SOLN: 650.0000 mg | ORAL | Status: DC | PRN

## 2017-06-10 MED ORDER — ACETAMINOPHEN 650 MG RE SUPP: 650.0000 mg | RECTAL | Status: DC | PRN

## 2017-06-10 MED ORDER — CLOPIDOGREL BISULFATE 75 MG PO TABS
75.0000 mg | ORAL_TABLET | Freq: Every day | ORAL | Status: DC
  Administered 2017-06-10 – 2017-06-12 (×3): 75 mg via ORAL
  Filled 2017-06-10 (×3): qty 1

## 2017-06-10 MED ORDER — AMOXICILLIN-POT CLAVULANATE 875-125 MG PO TABS
1.0000 | ORAL_TABLET | Freq: Two times a day (BID) | ORAL | Status: DC
  Administered 2017-06-10 – 2017-06-12 (×5): 1 via ORAL
  Filled 2017-06-10 (×5): qty 1

## 2017-06-10 MED ORDER — ACETAMINOPHEN 325 MG PO TABS: 650.0000 mg | ORAL_TABLET | ORAL | Status: DC | PRN

## 2017-06-10 MED ORDER — HEPARIN SOD (PORK) LOCK FLUSH 100 UNIT/ML IV SOLN
500.0000 [IU] | INTRAVENOUS | Status: DC | PRN
  Filled 2017-06-10: qty 5

## 2017-06-10 MED ORDER — MYCOPHENOLATE MOFETIL 250 MG PO CAPS
500.0000 mg | ORAL_CAPSULE | Freq: Two times a day (BID) | ORAL | Status: DC
  Administered 2017-06-10 – 2017-06-12 (×4): 500 mg via ORAL
  Filled 2017-06-10 (×5): qty 2

## 2017-06-10 MED ORDER — ATORVASTATIN CALCIUM 40 MG PO TABS: 40.0000 mg | ORAL_TABLET | Freq: Every day | ORAL | Status: DC

## 2017-06-10 MED ORDER — PANTOPRAZOLE SODIUM 40 MG PO TBEC
40.0000 mg | DELAYED_RELEASE_TABLET | Freq: Every day | ORAL | Status: DC
  Administered 2017-06-10 – 2017-06-12 (×3): 40 mg via ORAL
  Filled 2017-06-10 (×3): qty 1

## 2017-06-10 MED ORDER — CALCIUM CARBONATE 1250 (500 CA) MG PO TABS
2.0000 | ORAL_TABLET | Freq: Every day | ORAL | Status: DC
  Administered 2017-06-10 – 2017-06-12 (×3): 1000 mg via ORAL
  Filled 2017-06-10 (×5): qty 1

## 2017-06-10 NOTE — Evaluation (Signed)
Occupational Therapy Evaluation Patient Details Name: [name] [name] MRN: [id number] DOB: 22-Mar-1940 Today's Date: 06/10/2017    History of Present Illness 77 y.o. female past medical history of hypertension, polymyositis, diastolic CHF, colonic AVM, was brought into the [facility] emergency room on 06/09/2017 with complaints of generalized numbness and weakness that started around 5 AM on the morning of 06/09/2017.  Imaging revealed mutilple scattered left MCA territory strokes   Clinical Impression   Pt with significant decline in function and safety with ADLs and functional mobility. PTA pt at home with family and using power w/c, bathing, dressing and feeding herself; had assist with getting OOB, transferring to toilet and with home mgt. Pt currently unable to feed herself and requires + 2 assist with mobility and total A with ADLs and self feeding. Pt with no AROM of R UE and limited AROM of L UE with significant shoulder subluxation. Pt would benefit from acute OT services to address impairments to maximize level of function and safety. Recommending CIR at this time for continued rehab after acute care d/c     Follow Up Recommendations  CIR;Supervision/Assistance - 24 hour    Equipment Recommendations  Other (comment)(TBD at next venue of care)    Recommendations for Other Services Rehab consult     Precautions / Restrictions Precautions Precautions: Shoulder;Fall Type of Shoulder Precautions: L shoulder with subluxation Precaution Comments: requires + 2 asssit for bed mobility and for lateral scoot transfers Restrictions Weight Bearing Restrictions: No      Mobility Bed Mobility Overal bed mobility: Needs Assistance Bed Mobility: Supine to Sit     Supine to sit: Mod assist;+2 for physical assistance     General bed mobility comments: +2 moderate assist to move LEs to EOB and elevate trunk to upright, use of chuck pad to rotate hips to EOB  Transfers Overall transfer  level: Needs assistance   Transfers: Lateral/Scoot Transfers          Lateral/Scoot Transfers: Max assist;+2 physical assistance General transfer comment: +2 max assist to peform lateral scoot to left in 3 incremental movements using gait belt and chuck pad    Balance Overall balance assessment: Needs assistance Sitting-balance support: Feet supported Sitting balance-Leahy Scale: Fair Sitting balance - Comments: able to sit EOB unsupported but demonstrates a left bias at times Postural control: Left lateral lean                                 ADL either performed or assessed with clinical judgement   ADL Overall ADL's : Needs assistance/impaired Eating/Feeding: Sitting;Total assistance Eating/Feeding Details (indicate cue type and reason): pt unable to feed self due to impaired grip strength and impaired AROM of B UES Grooming: Total assistance   Upper Body Bathing: Total assistance   Lower Body Bathing: Total assistance   Upper Body Dressing : Total assistance   Lower Body Dressing: Total assistance   Toilet Transfer: +2 for physical assistance;+2 for safety/equipment;Maximal assistance Toilet Transfer Details (indicate cue type and reason): pt transeferred with max A + 2 lateral scoot to drop arm recliner from sitting EOB Toileting- Clothing Manipulation and Hygiene: Total assistance       Functional mobility during ADLs: Total assistance;+2 for physical assistance;+2 for safety/equipment General ADL Comments: Poor dynamic sitting balance, Fair static sitting balance sitting EOB     Vision Patient Visual Report: No change from baseline(per pt report)  Perception     Praxis      Pertinent Vitals/Pain Pain Assessment: No/denies pain Pain Score: 0-No pain Pain Intervention(s): Monitored during session;Repositioned     Hand Dominance Right   Extremity/Trunk Assessment Upper Extremity Assessment Upper Extremity Assessment: Generalized  weakness;RUE deficits/detail;LUE deficits/detail RUE Deficits / Details: zero AROM, flaccid tone RUE Coordination: decreased fine motor;decreased gross motor LUE Deficits / Details: L shoulder subluxation, AROM impaired in all planes LUE Coordination: decreased fine motor;decreased gross motor   Lower Extremity Assessment Lower Extremity Assessment: Defer to PT evaluation RLE Deficits / Details: noted assymterical weakness R weaker than Left <3/5 gross motions RLE Coordination: decreased fine motor;decreased gross motor LLE Deficits / Details: generalized weakness noted  LLE Coordination: decreased fine motor;decreased gross motor       Communication Communication Communication: No difficulties   Cognition Arousal/Alertness: Awake/alert Behavior During Therapy: WFL for tasks assessed/performed Overall Cognitive Status: No family/caregiver present to determine baseline cognitive functioning                                     General Comments       Exercises     Shoulder Instructions      Home Living Family/patient expects to be discharged to:: Private residence Living Arrangements: Children;Other relatives Available Help at Discharge: Family Type of Home: Apartment Home Access: Level entry     Home Layout: One level     Bathroom Shower/Tub: Teacher, early years/pre: Standard Bathroom Accessibility: Yes   Home Equipment: Wheelchair - power;Adaptive equipment;Grab bars - toilet Adaptive Equipment: Reacher        Prior Functioning/Environment Level of Independence: Needs assistance  Gait / Transfers Assistance Needed: son helps with bed mobility and transfers ADL's / Homemaking Assistance Needed: pan bathes per pt, family assist with home mgt            OT Problem List: Decreased strength;Decreased activity tolerance;Decreased knowledge of use of DME or AE;Impaired tone;Impaired UE functional use;Decreased range of motion;Decreased  coordination;Impaired balance (sitting and/or standing)      OT Treatment/Interventions: Self-care/ADL training;DME and/or AE instruction;Therapeutic activities;Balance training;Therapeutic exercise;Neuromuscular education;Patient/family education    OT Goals(Current goals can be found in the care plan section) Acute Rehab OT Goals Patient Stated Goal: to get back to how she was OT Goal Formulation: With patient Time For Goal Achievement: 06/24/17 Potential to Achieve Goals: Good ADL Goals Pt Will Perform Eating: with max assist;sitting Pt Will Perform Grooming: with max assist;sitting Pt Will Transfer to Toilet: with mod assist;stand pivot transfer;with transfer board;bedside commode Additional ADL Goal #1: pt will sit EOB x 5-10 minutes for trunk stability/balance tasks to improve posture for seated funcitonal tasks Additional ADL Goal #2: Pt will tolerate PROM to BUEs while seated EOB Additional ADL Goal #3: Pt will tolerate NMR and shoulder sling to improve subluxation of L shoulder  OT Frequency: Min 2X/week   Barriers to D/C: Decreased caregiver support          Co-evaluation   Reason for Co-Treatment: Complexity of the patient's impairments (multi-system involvement);Necessary to address cognition/behavior during functional activity;For patient/therapist safety;To address functional/ADL transfers PT goals addressed during session: Mobility/safety with mobility OT goals addressed during session: ADL's and self-care      AM-PAC PT "6 Clicks" Daily Activity     Outcome Measure Help from another person eating meals?: Total Help from another person taking care of personal grooming?:  Total Help from another person toileting, which includes using toliet, bedpan, or urinal?: Total Help from another person bathing (including washing, rinsing, drying)?: Total Help from another person to put on and taking off regular upper body clothing?: Total Help from another person to put on and  taking off regular lower body clothing?: Total 6 Click Score: 6   End of Session Equipment Utilized During Treatment: Gait belt  Activity Tolerance: Patient tolerated treatment well Patient left: in chair;with call bell/phone within reach;with nursing/sitter in room  OT Visit Diagnosis: Other symptoms and signs involving the nervous system (R29.898);Hemiplegia and hemiparesis Hemiplegia - Right/Left: Right Hemiplegia - dominant/non-dominant: Dominant Hemiplegia - caused by: Unspecified                Time: 4451-4604 OT Time Calculation (min): 25 min Charges:  OT General Charges $OT Visit: 1 Visit OT Evaluation $OT Eval Moderate Complexity: 1 Mod G-Codes: OT G-codes **NOT FOR INPATIENT CLASS** Functional Assessment Tool Used: AM-PAC 6 Clicks Daily Activity     Britt Bottom 06/10/2017, 2:31 PM

## 2017-06-10 NOTE — Progress Notes (Signed)
Pt granddaughter brought her some food and asked if we could feed her later. Pt refused.

## 2017-06-10 NOTE — ED Notes (Signed)
Pt stated "I've a;ways had trouble with my arms being weak.  I have a port because they can't ever get blood."

## 2017-06-10 NOTE — Progress Notes (Signed)
Attending MD note  Patient was seen, examined,treatment plan was discussed with the PA-S.  I have personally reviewed the clinical findings, lab, imaging studies and management of this patient in detail. I agree with the documentation, as recorded by the PA-S  Patient is a 77 year old female with history of hypertension, polymyositis, chronic diastolic CHF, colon AVMs was admitted to the hospital on 5/28 for bilateral right greater than left upper extremity weakness.  MRI on admission showed left frontoparietal CVA.  Neurology was consulted  BP 135/74 (BP Location: Right Arm)   Pulse 73   Temp 97.7 F (36.5 C) (Oral)   Resp 18   Ht 5\' 3"  (1.6 m)   Wt 77.1 kg (170 lb)   SpO2 99%   BMI 30.11 kg/m  On Exam: Gen. exam: Awake, alert, not in any distress Chest: Good air entry bilaterally, no rhonchi or rales CVS: S1-S2 regular, no murmurs Abdomen: Soft, nontender and nondistended Neurology: Significant right-sided upper extremity weakness 2 out of 5, weak but equal throughout the rest Skin: No rash or lesions  Plan  Acute CVA -Neurology consulted, MRI with patchy acute nonhemorrhagic infarcts in the left frontoparietal/MCA territories, it also did show old right basal ganglia and old right thalamus and small infarcts.  LDL elevated, start statin, A1c pending -PT recommends CIR, obtain rehab consult  Hypertension -Hold home medications, allow permissive hypertension, slowly normalized within 3 to 5 days  Pulmonary nodules -No history of smoking, will need repeat CT as an outpatient as per guidelines, defer to primary care  Polymyositis with significant deconditioning -Continue CellCept  Chronic diastolic CHF -2D echo is pending, she appears euvolemic on exam  URI -Patient has been having URI type symptoms with cough sputum production for the past 3 weeks.  She has been getting antibiotics for a few days and has just finished a steroid taper -Started on Augmentin on admission  for concern for aspiration, continue for 3 days as chest x-ray was negative    Rest as below  [name] [name] M. [name], MD [facility] [phone]  If 7PM-7AM, please contact night-coverage [email or web address] Password [email or web address]   PROGRESS NOTE  [name] [name] BSJ:[id number] DOB: 1940-07-21 DOA: 06/09/2017 PCP: [name], MD   LOS: 0 days   Brief Narrative / Interim history: [name] [name] is a 77 y.o. Female with a history of hypertension, polymyositis, diastolic CHF, AVM of the colon admitted to the hospital on 5/28 for bilateral weakness and numbness in upper extremities. Head MRI showed new nonhemorrhagic infarcts of left frontoparietal/MCA territories. Old right basal ganglia and thalamus infarcts noted.   Assessment & Plan: Active Problems:   Essential hypertension   Polymyositis (HCC)   Chronic diastolic CHF (congestive heart failure) (HCC)   CVA (cerebral vascular accident) ([place])   Multiple pulmonary nodules  CVA -Consult neurology for stroke workup -Obtain echo, lipid panel, PT/OT -CT head/neck angio shows multifocal severe stenosis anterior and middle cerebral arteries, patent carotid arteries, and incidental pulmonary nodules\ -Pharmological treatment: Aspirin chewable 81mg  qd, Plavix 75mg  qd, Lovenox 40mg  qd -PT recommending inpatient rehab  Hypertension -BP reading elevated in hospital stay, with recent episode of CVA permissible hypertension allowed. Follow up with PCP recommended.   Pulmonary Nodules -Incidental finding on CTA neck, Non contrast CT 3-6 months recommended -Denies history of smoking  Polymyositis -Pt is wheelchair bound for past 8 months with chronic weakness of all 4 extremities -Continue Cellcept   Chronic diastolic CHF -Echocardiogram ordered, results pending -Appears compensated, continue to monitor  Severe  Deconditioning -Pt previously wheelchair bound at home, needs assistance in ADLs such as moving from chair to commode -Suspect  will need inpatient rehab versus SNF  URI -Chest Xray shows no evidence of active pulmonary disease -Concern for aspiration, started on Augmentin on admission, continue    DVT prophylaxis: Lovenox 40mg  qd Code Status: Full code Family Communication: N/A Disposition Plan: SNF when improved if patient agrees  Consultants:   Neurology   Procedures:   2D echo: pending   Antimicrobials:  Augmentin 875-125mg  BID started 5/29  Subjective: Pt reports regaining some strength in her left extremity but her right remains weak. Prior to the incident she was able to feed herself and use both arms. However, since she has not been able to lift her right arm to do so. She has not experienced any associated confusion, but does report previous headache that has resolved. She is oriented to person, place, and time.   Objective: Vitals:   06/10/17 0319 06/10/17 0519 06/10/17 0752 06/10/17 1125  BP: (!) 164/138 (!) 183/82 (!) 156/93 135/74  Pulse: 67 71 75 73  Resp: 20 20 16 18   Temp: 97.6 F (36.4 C) (!) 97.5 F (36.4 C) 97.8 F (36.6 C) 97.7 F (36.5 C)  TempSrc: Axillary Oral Oral Oral  SpO2: 95% 98% 100% 99%  Weight:      Height:        Intake/Output Summary (Last 24 hours) at 06/10/2017 1205 Last data filed at 06/10/2017 0123 Gross per 24 hour  Intake 50 ml  Output -  Net 50 ml   Filed Weights   06/09/17 1450  Weight: 77.1 kg (170 lb)    Examination:  Constitutional: NAD Eyes: PERRL, lids and conjunctivae normal Respiratory: clear to auscultation bilaterally, no wheezing, no crackles. Normal respiratory effort. No accessory muscle use.  Cardiovascular: Regular rate and rhythm, no murmurs / rubs / gallops. No LE edema. 2+ pedal pulses. Abdomen: no tenderness. Bowel sounds positive.  Musculoskeletal: no clubbing / cyanosis. No joint deformity upper and lower extremities. No contractures. Normal muscle tone.  Skin: no rashes, lesions, ulcers. No induration Neurologic: CN  2-12 grossly intact. Strength 2/5 in right UE and 4/5 in left UE. 5/5 in bilateral LE.  Psychiatric: Normal judgment and insight. Alert and oriented x 3. Normal mood.    Data Reviewed: I have independently reviewed following labs and imaging studies.  CBC: Recent Labs  Lab 06/09/17 1648 06/09/17 1656  WBC 7.4  --   NEUTROABS 4.2  --   HGB 9.6* 10.5*  HCT 31.2* 31.0*  MCV 83.9  --   PLT 296  --    Basic Metabolic Panel: Recent Labs  Lab 06/09/17 1648 06/09/17 1656  NA 140 142  K 3.6 3.6  CL 104 105  CO2 25  --   GLUCOSE 84 81  BUN 12 9  CREATININE 0.42* 0.50  CALCIUM 9.2  --   MG 1.6*  --    GFR: Estimated Creatinine Clearance: 57.9 mL/min (by C-G formula based on SCr of 0.5 mg/dL). Liver Function Tests: Recent Labs  Lab 06/09/17 1648  AST 24  ALT 18  ALKPHOS 49  BILITOT 0.5  PROT 6.2*  ALBUMIN 3.6   No results for input(s): LIPASE, AMYLASE in the last 168 hours. No results for input(s): AMMONIA in the last 168 hours. Coagulation Profile: Recent Labs  Lab 06/09/17 1648  INR 1.01   Cardiac Enzymes: Recent Labs  Lab 06/09/17 1648  CKTOTAL 581*   BNP (  last 3 results) No results for input(s): PROBNP in the last 8760 hours. HbA1C: No results for input(s): HGBA1C in the last 72 hours. CBG: No results for input(s): GLUCAP in the last 168 hours. Lipid Profile: Recent Labs    06/10/17 0545  CHOL 299*  HDL 66  LDLCALC 164*  TRIG 347*  CHOLHDL 4.5   Thyroid Function Tests: No results for input(s): TSH, T4TOTAL, FREET4, T3FREE, THYROIDAB in the last 72 hours. Anemia Panel: No results for input(s): VITAMINB12, FOLATE, FERRITIN, TIBC, IRON, RETICCTPCT in the last 72 hours. Urine analysis:    Component Value Date/Time   COLORURINE STRAW (A) 06/09/2017 2135   APPEARANCEUR CLEAR 06/09/2017 2135   LABSPEC 1.023 06/09/2017 2135   PHURINE 5.0 06/09/2017 2135   GLUCOSEU NEGATIVE 06/09/2017 2135   HGBUR NEGATIVE 06/09/2017 2135   BILIRUBINUR NEGATIVE  06/09/2017 2135   KETONESUR NEGATIVE 06/09/2017 2135   PROTEINUR NEGATIVE 06/09/2017 2135   UROBILINOGEN 0.2 04/30/2013 1803   NITRITE NEGATIVE 06/09/2017 2135   LEUKOCYTESUR TRACE (A) 06/09/2017 2135   Sepsis Labs: Invalid input(s): PROCALCITONIN, LACTICIDVEN  No results found for this or any previous visit (from the past 240 hour(s)).    Radiology Studies: Ct Angio Head W Or Wo Contrast  Result Date: 06/09/2017 CLINICAL DATA:  Generalized numbness beginning in bilateral hands this morning. Follow-up infarct. History of hyperlipidemia. EXAM: CT ANGIOGRAPHY HEAD AND NECK TECHNIQUE: Multidetector CT imaging of the head and neck was performed using the standard protocol during bolus administration of intravenous contrast. Multiplanar CT image reconstructions and MIPs were obtained to evaluate the vascular anatomy. Carotid stenosis measurements (when applicable) are obtained utilizing NASCET criteria, using the distal internal carotid diameter as the denominator. CONTRAST:  44mL ISOVUE-370 IOPAMIDOL (ISOVUE-370) INJECTION 76% COMPARISON:  MRI of the head Jun 09, 2017 FINDINGS: CTA NECK FINDINGS: AORTIC ARCH: Normal appearance of the thoracic arch, normal branch pattern. Mild calcific atherosclerosis. The origins of the innominate, left Common carotid artery and subclavian artery are widely patent. RIGHT CAROTID SYSTEM: Common carotid artery is patent. Normal appearance of the carotid bifurcation without hemodynamically significant stenosis by NASCET criteria. Normal appearance of the internal carotid artery. LEFT CAROTID SYSTEM: Common carotid artery is patent. Normal appearance of the carotid bifurcation without hemodynamically significant stenosis by NASCET criteria. Normal appearance of the internal carotid artery. VERTEBRAL ARTERIES:Left vertebral artery is dominant. Normal appearance of the vertebral arteries, widely patent. SKELETON: No acute osseous process though bone windows have not been  submitted. Minimal grade 1 C4-5 anterolisthesis with multilevel advanced upper cervical facet arthropathy better demonstrated on today's MRI of the cervical spine. OTHER NECK: Soft tissues of the neck are nonacute though, not tailored for evaluation. RIGHT chest Port-A-Cath. UPPER CHEST: Multiple RIGHT upper lobe subsolid pulmonary nodules measuring to 5 mm. No superior mediastinal lymphadenopathy. CTA HEAD FINDINGS: ANTERIOR CIRCULATION: Patent cervical internal carotid arteries, petrous, cavernous and supra clinoid internal carotid arteries. Patent anterior and middle cerebral arteries. Multifocal severe stenosis bilateral A2 and A3 segments. Severe stenosis LEFT M2 inferior division origin. Multifocal severe stenosis bilateral mid to distal MCA including LEFT M3 segment. No large vessel occlusion, contrast extravasation or aneurysm. POSTERIOR CIRCULATION: Patent vertebral arteries, vertebrobasilar junction and basilar artery, as well as main branch vessels. Patent posterior cerebral arteries. Moderate stenosis RIGHT P2 segment. No large vessel occlusion, contrast extravasation or aneurysm. VENOUS SINUSES: Major dural venous sinuses are patent though not tailored for evaluation on this angiographic examination. ANATOMIC VARIANTS: None. DELAYED PHASE: No abnormal intracranial enhancement. MIP images  reviewed. IMPRESSION: CTA NECK: 1. No hemodynamically significant stenosis or acute vascular process in the neck. 2. **An incidental finding of potential clinical significance has been found. Multiple RIGHT upper lobe pulmonary nodules measuring less than 6 mm. Non-contrast chest CT at 3-6 months is recommended. If nodules persist and are stable at that time, consider additional non-contrast chest CT examinations at 2 and 4 years. This recommendation follows the consensus statement: Guidelines for Management of Incidental Pulmonary Nodules Detected on CT Images: From the Fleischner Society 2017; Radiology 2017;  284:228-243.** CTA HEAD: 1. No emergent large vessel occlusion. 2. Multifocal severe stenosis anterior and middle cerebral artery's may be secondary to atherosclerosis or hypertensive vasculopathy. Aortic Atherosclerosis (ICD10-I70.0). Electronically Signed   By: Elon Alas M.D.   On: 06/09/2017 20:54   Ct Head Wo Contrast  Result Date: 06/09/2017 CLINICAL DATA:  Numbness and weakness. EXAM: CT HEAD WITHOUT CONTRAST TECHNIQUE: Contiguous axial images were obtained from the base of the skull through the vertex without intravenous contrast. COMPARISON:  10/22/2016 FINDINGS: Brain: More prominent hypodensity along the anterior limb of the right internal capsule may represent a subacute lacunar infarct. Older adjacent lacunar infarct of the basal ganglia appears stable. Stable advanced small vessel ischemic disease in the periventricular white matter. The brain demonstrates no evidence of hemorrhage, edema, mass effect, extra-axial fluid collection, hydrocephalus or mass lesion. Vascular: No hyperdense vessel or unexpected calcification. Skull: The skull shows osteopenia without evidence of fracture or bony lesions. Sinuses/Orbits: There are air-fluid levels in both visualized maxillary antra. Mucosal thickening also noted in bilateral ethmoid and sphenoid air cells. Other: None. IMPRESSION: 1. More prominent hypodensity along the anterior limb of the right internal capsule may represent a subacute lacunar infarct. Older lacunar infarcts and advanced small vessel disease appears stable. 2. New air-fluid levels in both maxillary antra as well as mucosal thickening in bilateral ethmoid and sphenoid air cells are suggestive of active sinusitis. Electronically Signed   By: Aletta Edouard M.D.   On: 06/09/2017 15:37   Ct Angio Neck W And/or Wo Contrast  Result Date: 06/09/2017 CLINICAL DATA:  Generalized numbness beginning in bilateral hands this morning. Follow-up infarct. History of hyperlipidemia. EXAM: CT  ANGIOGRAPHY HEAD AND NECK TECHNIQUE: Multidetector CT imaging of the head and neck was performed using the standard protocol during bolus administration of intravenous contrast. Multiplanar CT image reconstructions and MIPs were obtained to evaluate the vascular anatomy. Carotid stenosis measurements (when applicable) are obtained utilizing NASCET criteria, using the distal internal carotid diameter as the denominator. CONTRAST:  2mL ISOVUE-370 IOPAMIDOL (ISOVUE-370) INJECTION 76% COMPARISON:  MRI of the head Jun 09, 2017 FINDINGS: CTA NECK FINDINGS: AORTIC ARCH: Normal appearance of the thoracic arch, normal branch pattern. Mild calcific atherosclerosis. The origins of the innominate, left Common carotid artery and subclavian artery are widely patent. RIGHT CAROTID SYSTEM: Common carotid artery is patent. Normal appearance of the carotid bifurcation without hemodynamically significant stenosis by NASCET criteria. Normal appearance of the internal carotid artery. LEFT CAROTID SYSTEM: Common carotid artery is patent. Normal appearance of the carotid bifurcation without hemodynamically significant stenosis by NASCET criteria. Normal appearance of the internal carotid artery. VERTEBRAL ARTERIES:Left vertebral artery is dominant. Normal appearance of the vertebral arteries, widely patent. SKELETON: No acute osseous process though bone windows have not been submitted. Minimal grade 1 C4-5 anterolisthesis with multilevel advanced upper cervical facet arthropathy better demonstrated on today's MRI of the cervical spine. OTHER NECK: Soft tissues of the neck are nonacute though, not tailored for  evaluation. RIGHT chest Port-A-Cath. UPPER CHEST: Multiple RIGHT upper lobe subsolid pulmonary nodules measuring to 5 mm. No superior mediastinal lymphadenopathy. CTA HEAD FINDINGS: ANTERIOR CIRCULATION: Patent cervical internal carotid arteries, petrous, cavernous and supra clinoid internal carotid arteries. Patent anterior and  middle cerebral arteries. Multifocal severe stenosis bilateral A2 and A3 segments. Severe stenosis LEFT M2 inferior division origin. Multifocal severe stenosis bilateral mid to distal MCA including LEFT M3 segment. No large vessel occlusion, contrast extravasation or aneurysm. POSTERIOR CIRCULATION: Patent vertebral arteries, vertebrobasilar junction and basilar artery, as well as main branch vessels. Patent posterior cerebral arteries. Moderate stenosis RIGHT P2 segment. No large vessel occlusion, contrast extravasation or aneurysm. VENOUS SINUSES: Major dural venous sinuses are patent though not tailored for evaluation on this angiographic examination. ANATOMIC VARIANTS: None. DELAYED PHASE: No abnormal intracranial enhancement. MIP images reviewed. IMPRESSION: CTA NECK: 1. No hemodynamically significant stenosis or acute vascular process in the neck. 2. **An incidental finding of potential clinical significance has been found. Multiple RIGHT upper lobe pulmonary nodules measuring less than 6 mm. Non-contrast chest CT at 3-6 months is recommended. If nodules persist and are stable at that time, consider additional non-contrast chest CT examinations at 2 and 4 years. This recommendation follows the consensus statement: Guidelines for Management of Incidental Pulmonary Nodules Detected on CT Images: From the Fleischner Society 2017; Radiology 2017; 284:228-243.** CTA HEAD: 1. No emergent large vessel occlusion. 2. Multifocal severe stenosis anterior and middle cerebral artery's may be secondary to atherosclerosis or hypertensive vasculopathy. Aortic Atherosclerosis (ICD10-I70.0). Electronically Signed   By: Elon Alas M.D.   On: 06/09/2017 20:54   Mr Brain Wo Contrast  Result Date: 06/09/2017 CLINICAL DATA:  Generalized numbness beginning in upper extremity since this morning, weakness. Recent viral illness, on prednisone. EXAM: MRI HEAD WITHOUT CONTRAST MRI CERVICAL SPINE WITHOUT CONTRAST TECHNIQUE:  Multiplanar, multiecho pulse sequences of the brain and surrounding structures, and cervical spine, to include the craniocervical junction and cervicothoracic junction, were obtained without intravenous contrast. COMPARISON:  CT HEAD Jun 09, 2017 and MRI of the cervical spine March 23, 2017 and MRI of the head April 22, 2012 FINDINGS: MRI HEAD FINDINGS INTRACRANIAL CONTENTS: Patchy reduced diffusion LEFT frontoparietal lobes with low ADC values. Subcentimeter reduced diffusion LEFT thalamus with normalized ADC values. No susceptibility artifact to suggest hemorrhage. Confluent supratentorial white matter FLAIR T2 hyperintensities. Patchy pontine T2 hyperintensities. Old RIGHT basal ganglia and RIGHT thalamus cystic small infarcts. Hazy T2 hyperintensities bilateral basal ganglia and bilateral thalami seen with chronic small vessel ischemic changes. No parenchymal brain volume loss for age. No midline shift, mass effect or masses. VASCULAR: Normal major intracranial vascular flow voids present at skull base. SKULL AND UPPER CERVICAL SPINE: No abnormal sellar expansion. No suspicious calvarial bone marrow signal. Craniocervical junction maintained. SINUSES/ORBITS: The mastoid air-cells and included paranasal sinuses are well-aerated.The included ocular globes and orbital contents are non-suspicious. OTHER: None. MRI CERVICAL SPINE FINDINGS ALIGNMENT: Straightened cervical lordosis. Minimal grade 1 C4-5 anterolisthesis. VERTEBRAE/DISCS: Vertebral bodies are intact. Intervertebral disc and demonstrate normal morphology with mild desiccation. No abnormal or acute bone marrow signal. CORD:Cervical spinal cord is normal morphology and signal characteristics from the cervicomedullary junction to level of T2-3, the most caudal well visualized level. POSTERIOR FOSSA, VERTEBRAL ARTERIES, PARASPINAL TISSUES: No MR findings of ligamentous injury. Vertebral artery flow voids present. Included posterior fossa and paraspinal soft  tissues are normal. DISC LEVELS: C2-3: No disc bulge. Uncovertebral hypertrophy and severe facet arthropathy without canal stenosis. Moderate to severe RIGHT neural  foraminal narrowing. C3-4: Uncovertebral hypertrophy. Moderate to severe bilateral facet arthropathy. No canal stenosis. Moderate to severe LEFT neural foraminal narrowing. C4-5: Anterolisthesis. Severe RIGHT and moderate LEFT facet arthropathy with trace RIGHT facet effusion. No canal stenosis or neural foraminal narrowing. C5-6, C6-7: No disc bulge, canal stenosis nor neural foraminal narrowing. C7-T1: No disc bulge. Mild facet arthropathy. No canal stenosis. Mild LEFT neural foraminal narrowing. IMPRESSION: MRI HEAD: 1. Patchy acute nonhemorrhagic infarcts LEFT frontoparietal/MCA territories. Subacute subcentimeter LEFT thalamus infarct. 2. Old RIGHT basal ganglia and old RIGHT thalamus small infarcts. Severe chronic small vessel ischemic changes. MRI CERVICAL SPINE: 1. Progressed degenerative change of the cervical spine, grade 1 C4-5 anterolisthesis. 2. No canal stenosis. Moderate to severe C2-3 and C3-4 neural foraminal narrowing. Electronically Signed   By: Elon Alas M.D.   On: 06/09/2017 18:59   Mr Cervical Spine Wo Contrast  Result Date: 06/09/2017 CLINICAL DATA:  Generalized numbness beginning in upper extremity since this morning, weakness. Recent viral illness, on prednisone. EXAM: MRI HEAD WITHOUT CONTRAST MRI CERVICAL SPINE WITHOUT CONTRAST TECHNIQUE: Multiplanar, multiecho pulse sequences of the brain and surrounding structures, and cervical spine, to include the craniocervical junction and cervicothoracic junction, were obtained without intravenous contrast. COMPARISON:  CT HEAD Jun 09, 2017 and MRI of the cervical spine March 23, 2017 and MRI of the head April 22, 2012 FINDINGS: MRI HEAD FINDINGS INTRACRANIAL CONTENTS: Patchy reduced diffusion LEFT frontoparietal lobes with low ADC values. Subcentimeter reduced diffusion LEFT  thalamus with normalized ADC values. No susceptibility artifact to suggest hemorrhage. Confluent supratentorial white matter FLAIR T2 hyperintensities. Patchy pontine T2 hyperintensities. Old RIGHT basal ganglia and RIGHT thalamus cystic small infarcts. Hazy T2 hyperintensities bilateral basal ganglia and bilateral thalami seen with chronic small vessel ischemic changes. No parenchymal brain volume loss for age. No midline shift, mass effect or masses. VASCULAR: Normal major intracranial vascular flow voids present at skull base. SKULL AND UPPER CERVICAL SPINE: No abnormal sellar expansion. No suspicious calvarial bone marrow signal. Craniocervical junction maintained. SINUSES/ORBITS: The mastoid air-cells and included paranasal sinuses are well-aerated.The included ocular globes and orbital contents are non-suspicious. OTHER: None. MRI CERVICAL SPINE FINDINGS ALIGNMENT: Straightened cervical lordosis. Minimal grade 1 C4-5 anterolisthesis. VERTEBRAE/DISCS: Vertebral bodies are intact. Intervertebral disc and demonstrate normal morphology with mild desiccation. No abnormal or acute bone marrow signal. CORD:Cervical spinal cord is normal morphology and signal characteristics from the cervicomedullary junction to level of T2-3, the most caudal well visualized level. POSTERIOR FOSSA, VERTEBRAL ARTERIES, PARASPINAL TISSUES: No MR findings of ligamentous injury. Vertebral artery flow voids present. Included posterior fossa and paraspinal soft tissues are normal. DISC LEVELS: C2-3: No disc bulge. Uncovertebral hypertrophy and severe facet arthropathy without canal stenosis. Moderate to severe RIGHT neural foraminal narrowing. C3-4: Uncovertebral hypertrophy. Moderate to severe bilateral facet arthropathy. No canal stenosis. Moderate to severe LEFT neural foraminal narrowing. C4-5: Anterolisthesis. Severe RIGHT and moderate LEFT facet arthropathy with trace RIGHT facet effusion. No canal stenosis or neural foraminal  narrowing. C5-6, C6-7: No disc bulge, canal stenosis nor neural foraminal narrowing. C7-T1: No disc bulge. Mild facet arthropathy. No canal stenosis. Mild LEFT neural foraminal narrowing. IMPRESSION: MRI HEAD: 1. Patchy acute nonhemorrhagic infarcts LEFT frontoparietal/MCA territories. Subacute subcentimeter LEFT thalamus infarct. 2. Old RIGHT basal ganglia and old RIGHT thalamus small infarcts. Severe chronic small vessel ischemic changes. MRI CERVICAL SPINE: 1. Progressed degenerative change of the cervical spine, grade 1 C4-5 anterolisthesis. 2. No canal stenosis. Moderate to severe C2-3 and C3-4 neural foraminal narrowing. Electronically Signed  By: Elon Alas M.D.   On: 06/09/2017 18:59   Dg Chest Port 1 View  Result Date: 06/09/2017 CLINICAL DATA:  Weakness EXAM: PORTABLE CHEST 1 VIEW COMPARISON:  01/02/2017 FINDINGS: Porta catheter on the right with tip at the upper cavoatrial junction. Borderline heart size. Chronic right diaphragm elevation with scarring at the right base. There is no edema, consolidation, effusion, or pneumothorax. No acute osseous finding. Distal right clavicle resection. IMPRESSION: Stable from prior.  No evidence of active disease. Electronically Signed   By: Monte Fantasia M.D.   On: 06/09/2017 16:13   Dg Swallowing Func-speech Pathology  Result Date: 06/10/2017 Objective Swallowing Evaluation: Type of Study: MBS-Modified Barium Swallow Study  Patient Details Name: [name] [name] MRN: [id number] Date of Birth: [birth year] Today's Date: 06/10/2017 Time: SLP Start Time (ACUTE ONLY): 0825 -SLP Stop Time (ACUTE ONLY): 0859 SLP Time Calculation (min) (ACUTE ONLY): 34 min Past Medical History: Past Medical History: Diagnosis Date . Allergic rhinitis, cause unspecified  . Benign neoplasm of colon  . Esophageal reflux  . Excessive daytime sleepiness  . 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 GI hemorrhage 2011  recurrent . Hyperlipidemia  . Osteoporosis  . Polymyositis ([place])   . Unspecified essential hypertension  . Vitamin D deficiency  Past Surgical History: Past Surgical History: Procedure Laterality Date . ABDOMINAL HYSTERECTOMY    partial . bilateral  rotator cuff surgery   . COLONOSCOPY  01/09/2011  others also . COLONOSCOPY  05/29/2011  Procedure: COLONOSCOPY;  Surgeon: Gatha Mayer, MD;  Location: [facility];  Service: Endoscopy;  Laterality: N/A;  Greggory Brandy Carlean Purl . COLONOSCOPY WITH PROPOFOL N/A 02/16/2014  Procedure: COLONOSCOPY WITH PROPOFOL;  Surgeon: Milus Banister, MD;  Location: [facility];  Service: Endoscopy;  Laterality: N/A; . ESOPHAGOGASTRODUODENOSCOPY  12/08/2010  others also . ESOPHAGOGASTRODUODENOSCOPY (EGD) WITH PROPOFOL N/A 02/16/2014  Procedure: ESOPHAGOGASTRODUODENOSCOPY (EGD) WITH PROPOFOL;  Surgeon: Milus Banister, MD;  Location: [facility];  Service: Endoscopy;  Laterality: N/A; . EUS   . HOT HEMOSTASIS  05/29/2011  Procedure: HOT HEMOSTASIS (ARGON PLASMA COAGULATION/BICAP);  Surgeon: Gatha Mayer, MD;  Location: [facility];  Service: Endoscopy;  Laterality: N/A; . LUMBAR LAMINECTOMY   . 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  Pt initially passed RNSSS but then was noted to have foaming secretions in mouth and drooling.  Pt is wheelchair bound and has chronic upper body weakness.  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  SLP advised to order MBS in lieu of clinical evaluation.   Subjective: pt awake in chair Assessment / Plan / Recommendation CHL IP CLINICAL IMPRESSIONS 06/10/2017 Clinical Impression Pt presents with overall functional oropharyngeal swallow.  NO aspiration/consistent laryngeal penetration of consistencies tested.  Pt did have a single episode of laryngeal penetration to vocal folds with thin x1 clearing independently.  Of note, pt did cough during MBS but barium was not visualized in  larynx.  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   SLP Visit Diagnosis Dysphagia, pharyngoesophageal phase (R13.14) Attention and concentration deficit following -- Frontal lobe and executive function deficit following -- Impact on safety and function Moderate aspiration risk   CHL IP TREATMENT RECOMMENDATION 06/10/2017 Treatment Recommendations Therapy as outlined in treatment plan below   Prognosis 06/10/2017 Prognosis for Safe Diet Advancement Guarded Barriers  to Reach Goals -- Barriers/Prognosis Comment -- CHL IP DIET RECOMMENDATION 06/10/2017 SLP Diet Recommendations Dysphagia 3 (Mech soft) solids;Thin liquid Liquid Administration via Cup;Straw Medication Administration Whole meds with puree Compensations Slow rate;Small sips/bites Postural Changes Seated upright at 90 degrees;Remain semi-upright after after feeds/meals (Comment)   CHL IP OTHER RECOMMENDATIONS 06/10/2017 Recommended Consults -- Oral Care Recommendations Oral care BID Other Recommendations --   CHL IP FOLLOW UP RECOMMENDATIONS 06/10/2017 Follow up Recommendations None   CHL IP FREQUENCY AND DURATION 06/10/2017 Speech Therapy Frequency (ACUTE ONLY) min 1 x/week Treatment Duration 1 week      CHL IP ORAL PHASE 06/10/2017 Oral Phase WFL Oral - Pudding Teaspoon -- Oral - Pudding Cup -- Oral - Honey Teaspoon -- Oral - Honey Cup -- Oral - Nectar Teaspoon WFL Oral - Nectar Cup -- Oral - Nectar Straw WFL Oral - Thin Teaspoon WFL Oral - Thin Cup WFL Oral - Thin Straw WFL Oral - Puree WFL Oral - Mech Soft -- Oral - Regular WFL Oral - Multi-Consistency -- Oral - Pill WFL Oral Phase - Comment --  CHL IP PHARYNGEAL PHASE 06/10/2017 Pharyngeal Phase Impaired Pharyngeal- Pudding Teaspoon -- Pharyngeal -- Pharyngeal- Pudding Cup -- Pharyngeal -- Pharyngeal- Honey Teaspoon -- Pharyngeal -- Pharyngeal- Honey Cup -- Pharyngeal -- Pharyngeal- Nectar Teaspoon Delayed swallow initiation-vallecula  Pharyngeal -- Pharyngeal- Nectar Cup -- Pharyngeal -- Pharyngeal- Nectar Straw Delayed swallow initiation-vallecula Pharyngeal -- Pharyngeal- Thin Teaspoon Delayed swallow initiation-vallecula;WFL Pharyngeal -- Pharyngeal- Thin Cup WFL;Delayed swallow initiation-vallecula Pharyngeal -- Pharyngeal- Thin Straw Penetration/Aspiration before swallow;Delayed swallow initiation-vallecula;Delayed swallow initiation-pyriform sinuses Pharyngeal Material enters airway, CONTACTS cords and then ejected out Pharyngeal- Puree WFL Pharyngeal -- Pharyngeal- Mechanical Soft -- Pharyngeal -- Pharyngeal- Regular WFL Pharyngeal -- Pharyngeal- Multi-consistency -- Pharyngeal -- Pharyngeal- Pill WFL Pharyngeal -- Pharyngeal Comment trace silent laryngeal penetration of thin via straw to vocal cords x1 of 5 boluses   CHL IP CERVICAL ESOPHAGEAL PHASE 06/10/2017 Cervical Esophageal Phase Impaired Pudding Teaspoon -- Pudding Cup -- Honey Teaspoon -- Honey Cup -- Nectar Teaspoon Reduced cricopharyngeal relaxation;Prominent cricopharyngeal segment Nectar Cup -- Nectar Straw Reduced cricopharyngeal relaxation;Prominent cricopharyngeal segment Thin Teaspoon Reduced cricopharyngeal relaxation;Prominent cricopharyngeal segment Thin Cup Reduced cricopharyngeal relaxation;Prominent cricopharyngeal segment Thin Straw Reduced cricopharyngeal relaxation;Prominent cricopharyngeal segment Puree Reduced cricopharyngeal relaxation;Prominent cricopharyngeal segment Mechanical Soft -- Regular Reduced cricopharyngeal relaxation;Prominent cricopharyngeal segment Multi-consistency -- Pill Prominent cricopharyngeal segment;Reduced cricopharyngeal relaxation Cervical Esophageal Comment appearance of possibly developing prominent cricopharyngeus that may be developing into Zenker's diverticulum,  No flowsheet data found. Macario Golds 06/10/2017, 9:35 AM    Luanna Salk, MS [facility] SLP [phone]             Scheduled Meds: . amoxicillin-clavulanate  1 tablet  Oral Q12H  . aspirin  81 mg Oral Daily  . calcium carbonate  2 tablet Oral Daily  . clopidogrel  75 mg Oral Daily  . enoxaparin (LOVENOX) injection  40 mg Subcutaneous Q24H  . febuxostat  40 mg Oral Daily  . heparin lock flush  500 Units Intracatheter Q30 days  . mycophenolate  500 mg Oral BID  . pantoprazole  40 mg Oral Daily   Carita Pian, PA-S   Marzetta Board, MD, PhD [facility] Pager [phone]  If 7PM-7AM, please contact night-coverage [email or web address] Password [email or web address] 06/10/2017, 12:05 PM   [email or web address]

## 2017-06-10 NOTE — Evaluation (Signed)
Physical Therapy Evaluation Patient Details Name: [name] [name] MRN: [id number] DOB: [birth year] Today's Date: 06/10/2017   History of Present Illness  77 y.o. female past medical history of hypertension, polymyositis, diastolic CHF, colonic AVM, was brought into the [facility] emergency room on 06/09/2017 with complaints of generalized numbness and weakness that started around 5 AM on the morning of 06/09/2017.  Imaging revealed mutilple scattered left MCA territory strokes  Clinical Impression  Orders received for PT evaluation. Patient demonstrates deficits in functional mobility as indicated below. Will benefit from continued skilled PT to address deficits and maximize function. Will see as indicated and progress as tolerated.  Prior to admission, patient was able to transfer in and out of power chair with assist from son, patient able to feed herself and perform some functions of care. At this time, patient unable to do so despite increased assist. Recommend CIR consult for comprehensive therapies to decrease burden of care.    Follow Up Recommendations CIR;Supervision/Assistance - 24 hour    Equipment Recommendations  (TBD)    Recommendations for Other Services       Precautions / Restrictions Precautions Precautions: Fall      Mobility  Bed Mobility Overal bed mobility: Needs Assistance Bed Mobility: Supine to Sit     Supine to sit: Mod assist;+2 for physical assistance     General bed mobility comments: +2 moderate assist to move LEs to EOB and elevate trunk to upright, use of chuck pad to rotate hips to EOB  Transfers Overall transfer level: Needs assistance   Transfers: Lateral/Scoot Transfers          Lateral/Scoot Transfers: Max assist;+2 physical assistance General transfer comment: +2 max assist to peform lateral scoot to left in 3 incremental movements using gait belt and chuck pad  Ambulation/Gait             General Gait Details: non  ambulatory  Stairs            Wheelchair Mobility    Modified Rankin (Stroke Patients Only) Modified Rankin (Stroke Patients Only) Pre-Morbid Rankin Score: Severe disability Modified Rankin: Severe disability     Balance Overall balance assessment: Needs assistance Sitting-balance support: Feet supported Sitting balance-Leahy Scale: Fair Sitting balance - Comments: able to sit EOB unsupported but demonstrates a left bias at times Postural control: Left lateral lean                                   Pertinent Vitals/Pain Pain Assessment: 0-10    Home Living Family/patient expects to be discharged to:: Private residence Living Arrangements: Children;Other relatives Available Help at Discharge: Family Type of Home: Apartment Home Access: Level entry     Home Layout: One level Home Equipment: Wheelchair - power      Prior Function Level of Independence: Needs assistance   Gait / Transfers Assistance Needed: son helps with bed mobility and transfers  ADL's / Homemaking Assistance Needed: pan bathes per pt, family assist with home mgt        Hand Dominance   Dominant Hand: Right    Extremity/Trunk Assessment   Upper Extremity Assessment Upper Extremity Assessment: Generalized weakness;RUE deficits/detail;LUE deficits/detail    Lower Extremity Assessment Lower Extremity Assessment: Generalized weakness;RLE deficits/detail;LLE deficits/detail RLE Deficits / Details: noted assymterical weakness R weaker than Left <3/5 gross motions RLE Coordination: decreased fine motor;decreased gross motor LLE Deficits / Details: generalized weakness noted  LLE Coordination: decreased fine motor;decreased gross motor       Communication   Communication: No difficulties  Cognition Arousal/Alertness: Awake/alert Behavior During Therapy: WFL for tasks assessed/performed Overall Cognitive Status: No family/caregiver present to determine baseline cognitive  functioning                                        General Comments      Exercises     Assessment/Plan    PT Assessment Patient needs continued PT services  PT Problem List Decreased strength;Decreased activity tolerance;Decreased balance;Decreased mobility;Decreased safety awareness;Impaired tone;Decreased coordination       PT Treatment Interventions DME instruction;Gait training;Functional mobility training;Therapeutic activities;Therapeutic exercise;Balance training;Neuromuscular re-education;Patient/family education    PT Goals (Current goals can be found in the Care Plan section)  Acute Rehab PT Goals Patient Stated Goal: to get back to how she was PT Goal Formulation: With patient Time For Goal Achievement: 06/24/17 Potential to Achieve Goals: Fair    Frequency Min 3X/week   Barriers to discharge        Co-evaluation PT/OT/SLP Co-Evaluation/Treatment: Yes Reason for Co-Treatment: Complexity of the patient's impairments (multi-system involvement);Necessary to address cognition/behavior during functional activity;To address functional/ADL transfers;For patient/therapist safety PT goals addressed during session: Mobility/safety with mobility OT goals addressed during session: ADL's and self-care       AM-PAC PT "6 Clicks" Daily Activity  Outcome Measure Difficulty turning over in bed (including adjusting bedclothes, sheets and blankets)?: Unable Difficulty moving from lying on back to sitting on the side of the bed? : Unable Difficulty sitting down on and standing up from a chair with arms (e.g., wheelchair, bedside commode, etc,.)?: Unable Help needed moving to and from a bed to chair (including a wheelchair)?: A Lot Help needed walking in hospital room?: Total Help needed climbing 3-5 steps with a railing? : Total 6 Click Score: 7    End of Session Equipment Utilized During Treatment: Gait belt Activity Tolerance: Patient tolerated treatment  well Patient left: in chair;with call bell/phone within reach Nurse Communication: Mobility status PT Visit Diagnosis: Other symptoms and signs involving the nervous system (R29.898)    Time: 8372-9021 PT Time Calculation (min) (ACUTE ONLY): 25 min   Charges:   PT Evaluation $PT Eval Moderate Complexity: 1 Mod     PT G Codes:        Alben Deeds, PT DPT  Board Certified Neurologic Specialist [place] 06/10/2017, 1:26 PM

## 2017-06-10 NOTE — Consult Note (Signed)
Physical Medicine and Rehabilitation Consult Reason for Consult: Decreased functional mobility Referring Physician: [facility]   HPI: [name] [name] is a 77 y.o. right-handed female with history of hypertension, polymyositis, diastolic congestive heart failure, colonic AVM.  Presented to [facility] emergency room 06/09/2017 with complaints of generalized numbness and weakness.  At baseline patient is essentially wheelchair-bound and has chronic bilateral upper extremity weakness attributable to mild polymyositis.  Son would help with bed mobility and transfers.  One level apartment with level entry.  Son works during the day but would help his mother get up in the mornings and get to her power chair before leaving for work.  CT head reviewed, showing anterior capsule infarct.  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  Subacute subcentimeter left thalamus infarction as well as old right basal ganglia and old right thalamus and small infarcts.  MRI cervical spine with no canal stenosis.  Patient did not receive TPA.  CT angiogram of head and neck with no emergent large vessel occlusion.  Incidental findings of multiple right upper lobe pulmonary nodules with recommendations of chest CT 3 to 6 months recommended.  Echocardiogram is pending.  Presently on aspirin and Plavix for CVA prophylaxis.  Subcutaneous Lovenox for DVT prophylaxis.  Tolerating a regular diet.  Physical therapy evaluation completed with recommendations of physical medicine rehab consult.   Review of Systems  Constitutional: Negative for chills and fever.  HENT: Negative for hearing loss.   Eyes: Negative for blurred vision and double vision.  Respiratory: Negative for cough and shortness of breath.   Cardiovascular: Positive for leg swelling. Negative for chest pain and palpitations.  Gastrointestinal:       GERD  Skin: Negative for rash.  Neurological: Positive for  sensory change and weakness.  Psychiatric/Behavioral: The patient has insomnia.   All other systems reviewed and are negative.  Past Medical History:  Diagnosis Date  . Allergic rhinitis, cause unspecified   . Benign neoplasm of colon   . Esophageal reflux   . Excessive daytime sleepiness   . 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 GI hemorrhage 2011   recurrent  . Hyperlipidemia   . Osteoporosis   . Polymyositis ([place])   . Unspecified essential hypertension   . Vitamin D deficiency    Past Surgical History:  Procedure Laterality Date  . ABDOMINAL HYSTERECTOMY     partial  . bilateral  rotator cuff surgery    . COLONOSCOPY  01/09/2011   others also  . COLONOSCOPY  05/29/2011   Procedure: COLONOSCOPY;  Surgeon: Gatha Mayer, MD;  Location: [facility] ENDOSCOPY;  Service: Endoscopy;  Laterality: N/A;  Greggory Brandy Carlean Purl  . COLONOSCOPY WITH PROPOFOL N/A 02/16/2014   Procedure: COLONOSCOPY WITH PROPOFOL;  Surgeon: Milus Banister, MD;  Location: [facility] ENDOSCOPY;  Service: Endoscopy;  Laterality: N/A;  . ESOPHAGOGASTRODUODENOSCOPY  12/08/2010   others also  . ESOPHAGOGASTRODUODENOSCOPY (EGD) WITH PROPOFOL N/A 02/16/2014   Procedure: ESOPHAGOGASTRODUODENOSCOPY (EGD) WITH PROPOFOL;  Surgeon: Milus Banister, MD;  Location: [facility] ENDOSCOPY;  Service: Endoscopy;  Laterality: N/A;  . EUS    . HOT HEMOSTASIS  05/29/2011   Procedure: HOT HEMOSTASIS (ARGON PLASMA COAGULATION/BICAP);  Surgeon: Gatha Mayer, MD;  Location: [facility];  Service: Endoscopy;  Laterality: N/A;  . LUMBAR LAMINECTOMY    . TONSILLECTOMY  age 63   Family History  Problem Relation Age of Onset  . Dementia Mother   .  Hypertension Father   . Malignant hyperthermia Father   . Colon cancer Neg Hx    Social History:  reports that she has never smoked. She has quit using smokeless tobacco. Her smokeless tobacco use included snuff. She reports that she does not drink alcohol or use drugs. Allergies:  Allergies  Allergen Reactions   . Codeine Other (See Comments)    Feels drunk  . Hydrocodone     "makes me sleepy"  . Tramadol Hcl Other (See Comments)    Makes me feel drunk   Medications Prior to Admission  Medication Sig Dispense Refill  . allopurinol (ZYLOPRIM) 300 MG tablet Take 300 mg by mouth daily.  10  . Ascorbic Acid (VITAMIN C) 1000 MG tablet Take 1,000 mg by mouth daily.     Marland Kitchen atenolol (TENORMIN) 50 MG tablet Take 50 mg by mouth daily.      . Calcium Carbonate (CALTRATE 600) 1500 MG TABS Take 2 tablets by mouth daily.     . Cholecalciferol (VITAMIN D PO) Take 2 tablets by mouth daily.     . DUREZOL 0.05 % EMUL Place 1 drop into both eyes daily.    Marland Kitchen esomeprazole (NEXIUM) 40 MG capsule Take one capsule by thirty minutes before breakfast Take one capsule by thirty minutes before dinner (Patient taking differently: Take 40 mg by mouth 2 (two) times daily before a meal. Take one capsule by thirty minutes before breakfast Take one capsule by thirty minutes before dinner) 60 capsule 3  . folic acid (FOLVITE) 505 MCG tablet Take 400 mcg by mouth daily.      . furosemide (LASIX) 40 MG tablet Take 40 mg by mouth daily.      . mycophenolate (CELLCEPT) 500 MG tablet 500 mg TWICE DAILY  1  . omega-3 acid ethyl esters (LOVAZA) 1 g capsule Take 2 g by mouth 2 (two) times daily.   10  . OVER THE COUNTER MEDICATION Take 1 capsule by mouth daily. Beet Root    . PAPAYA PO Take 1 tablet by mouth daily.    . potassium chloride SA (K-DUR,KLOR-CON) 20 MEQ tablet Take 2 tablets (40 mEq total) by mouth 2 (two) times daily. 20 tablet 0  . predniSONE (DELTASONE) 20 MG tablet TK 1 T PO ONCE D FOR 6 DAYS  0  . PROLENSA 0.07 % SOLN Place 1 drop into both eyes daily. As directed    . psyllium (HYDROCIL/METAMUCIL) 95 % PACK Take 1 packet by mouth daily. (Patient taking differently: Take 1 packet by mouth daily as needed (gas). ) 56 each 6  . ULORIC 40 MG tablet Take 40 mg by mouth daily.    Marland Kitchen VITAMIN A PO Take 1 tablet by mouth daily.     . vitamin B-12 (CYANOCOBALAMIN) 1000 MCG tablet Take 1,000 mcg by mouth daily.      Marland Kitchen VITAMIN E PO Take 1 tablet by mouth.    Marland Kitchen acetaminophen (TYLENOL 8 HOUR) 650 MG CR tablet Take 1 tablet (650 mg total) by mouth every 8 (eight) hours as needed. (Patient taking differently: Take 650 mg by mouth every 8 (eight) hours as needed for pain. ) 30 tablet 0  . ALPRAZolam (XANAX) 0.25 MG tablet Take 0.25 mg by mouth 2 (two) times daily as needed for anxiety or sleep.     . benzonatate (TESSALON) 100 MG capsule TK 1 C PO TID PRN COUGH  0  . CARAFATE 1 GM/10ML suspension Take 1 g by mouth 2 (two)  times daily as needed (GAS/INDIGESTION).     Marland Kitchen colchicine 0.6 MG tablet Take 0.6 mg by mouth. Take 1 tablet by mouth every 8 hours as needed for gout flare  5  . fluticasone (FLONASE) 50 MCG/ACT nasal spray Place 1 spray into both nostrils daily as needed for allergies.     Marland Kitchen gabapentin (NEURONTIN) 100 MG capsule TAKE ONE CAPSULE BY MOUTH TWICE DAILY (Patient taking differently: TAKE ONE CAPSULE BY MOUTH TWICE DAILY AS NEEDED FOR NERVE PAIN) 30 capsule 0  . loratadine (CLARITIN) 10 MG tablet Take 10 mg by mouth daily as needed. For allergies.    . sodium chloride (OCEAN) 0.65 % nasal spray Place 1 spray into the nose at bedtime as needed for congestion (allergies).       Home: Home Living Family/patient expects to be discharged to:: Private residence Living Arrangements: Children, Other relatives Available Help at Discharge: Family Type of Home: Apartment Home Access: Level entry Home Layout: One level Bathroom Shower/Tub: Chiropodist: Standard Bathroom Accessibility: Yes Home Equipment: Wheelchair - power  Functional History: Prior Function Level of Independence: Needs assistance Gait / Transfers Assistance Needed: son helps with bed mobility and transfers ADL's / Homemaking Assistance Needed: pan bathes per pt, family assist with home mgt Functional Status:  Mobility: Bed  Mobility Overal bed mobility: Needs Assistance Bed Mobility: Supine to Sit Supine to sit: Mod assist, +2 for physical assistance General bed mobility comments: +2 moderate assist to move LEs to EOB and elevate trunk to upright, use of chuck pad to rotate hips to EOB Transfers Overall transfer level: Needs assistance Transfers: Lateral/Scoot Transfers  Lateral/Scoot Transfers: Max assist, +2 physical assistance General transfer comment: +2 max assist to peform lateral scoot to left in 3 incremental movements using gait belt and chuck pad Ambulation/Gait General Gait Details: non ambulatory    ADL:    Cognition: Cognition Overall Cognitive Status: No family/caregiver present to determine baseline cognitive functioning Orientation Level: Oriented X4 Cognition Arousal/Alertness: Awake/alert Behavior During Therapy: WFL for tasks assessed/performed Overall Cognitive Status: No family/caregiver present to determine baseline cognitive functioning  Blood pressure 135/74, pulse 73, temperature 97.7 F (36.5 C), temperature source Oral, resp. rate 18, height 5\' 3"  (1.6 m), weight 77.1 kg (170 lb), SpO2 99 %. Physical Exam  Vitals reviewed. Constitutional: She is oriented to person, place, and time. She appears well-developed and well-nourished.  HENT:  Head: Normocephalic and atraumatic.  Eyes: EOM are normal. Right eye exhibits no discharge. Left eye exhibits no discharge.  Neck: Normal range of motion. Neck supple. No thyromegaly present.  Cardiovascular: Normal rate and regular rhythm.  Respiratory: Effort normal and breath sounds normal. No respiratory distress.  GI: Soft. Bowel sounds are normal. She exhibits no distension.  Musculoskeletal:  No edema or tenderness in extremities  Neurological: She is alert and oriented to person, place, and time.  Follows commands Dysarthria  Facial weakness Motor: Right upper extremity: Shoulder abduction, elbow flexion/extension 2/5, hand  3/5 grip Left lower extremity Left upper extremity: Shoulder abduction, elbow flexion/extension 2+/5, handgrip 4/5  Bilateral lower extremities: Proximally 2/5, distally 3/5 (right weaker than left) Sensation intact light touch  Skin: Skin is warm and dry.  Psychiatric: She has a normal mood and affect. Her behavior is normal.    Results for orders placed or performed during the hospital encounter of 06/09/17 (from the past 24 hour(s))  Protime-INR     Status: None   Collection Time: 06/09/17  4:48 PM  Result  Value Ref Range   Prothrombin Time 13.2 11.4 - 15.2 seconds   INR 1.01   APTT     Status: Abnormal   Collection Time: 06/09/17  4:48 PM  Result Value Ref Range   aPTT 95 (H) 24 - 36 seconds  CBC     Status: Abnormal   Collection Time: 06/09/17  4:48 PM  Result Value Ref Range   WBC 7.4 4.0 - 10.5 K/uL   RBC 3.72 (L) 3.87 - 5.11 MIL/uL   Hemoglobin 9.6 (L) 12.0 - 15.0 g/dL   HCT 31.2 (L) 36.0 - 46.0 %   MCV 83.9 78.0 - 100.0 fL   MCH 25.8 (L) 26.0 - 34.0 pg   MCHC 30.8 30.0 - 36.0 g/dL   RDW 17.8 (H) 11.5 - 15.5 %   Platelets 296 150 - 400 K/uL  Differential     Status: None   Collection Time: 06/09/17  4:48 PM  Result Value Ref Range   Neutrophils Relative % 55 %   Neutro Abs 4.2 1.7 - 7.7 K/uL   Lymphocytes Relative 33 %   Lymphs Abs 2.4 0.7 - 4.0 K/uL   Monocytes Relative 10 %   Monocytes Absolute 0.7 0.1 - 1.0 K/uL   Eosinophils Relative 2 %   Eosinophils Absolute 0.1 0.0 - 0.7 K/uL   Basophils Relative 0 %   Basophils Absolute 0.0 0.0 - 0.1 K/uL  Comprehensive metabolic panel     Status: Abnormal   Collection Time: 06/09/17  4:48 PM  Result Value Ref Range   Sodium 140 135 - 145 mmol/L   Potassium 3.6 3.5 - 5.1 mmol/L   Chloride 104 101 - 111 mmol/L   CO2 25 22 - 32 mmol/L   Glucose, Bld 84 65 - 99 mg/dL   BUN 12 6 - 20 mg/dL   Creatinine, Ser 0.42 (L) 0.44 - 1.00 mg/dL   Calcium 9.2 8.9 - 10.3 mg/dL   Total Protein 6.2 (L) 6.5 - 8.1 g/dL   Albumin 3.6  3.5 - 5.0 g/dL   AST 24 15 - 41 U/L   ALT 18 14 - 54 U/L   Alkaline Phosphatase 49 38 - 126 U/L   Total Bilirubin 0.5 0.3 - 1.2 mg/dL   GFR calc non Af Amer >60 >60 mL/min   GFR calc Af Amer >60 >60 mL/min   Anion gap 11 5 - 15  Magnesium     Status: Abnormal   Collection Time: 06/09/17  4:48 PM  Result Value Ref Range   Magnesium 1.6 (L) 1.7 - 2.4 mg/dL  CK     Status: Abnormal   Collection Time: 06/09/17  4:48 PM  Result Value Ref Range   Total CK 581 (H) 38 - 234 U/L  I-stat troponin, ED     Status: None   Collection Time: 06/09/17  4:54 PM  Result Value Ref Range   Troponin i, poc 0.00 0.00 - 0.08 ng/mL   Comment 3          I-Stat Chem 8, ED     Status: Abnormal   Collection Time: 06/09/17  4:56 PM  Result Value Ref Range   Sodium 142 135 - 145 mmol/L   Potassium 3.6 3.5 - 5.1 mmol/L   Chloride 105 101 - 111 mmol/L   BUN 9 6 - 20 mg/dL   Creatinine, Ser 0.50 0.44 - 1.00 mg/dL   Glucose, Bld 81 65 - 99 mg/dL   Calcium, Ion 1.27 1.15 - 1.40  mmol/L   TCO2 27 22 - 32 mmol/L   Hemoglobin 10.5 (L) 12.0 - 15.0 g/dL   HCT 31.0 (L) 36.0 - 46.0 %  Urinalysis, Routine w reflex microscopic     Status: Abnormal   Collection Time: 06/09/17  9:35 PM  Result Value Ref Range   Color, Urine STRAW (A) YELLOW   APPearance CLEAR CLEAR   Specific Gravity, Urine 1.023 1.005 - 1.030   pH 5.0 5.0 - 8.0   Glucose, UA NEGATIVE NEGATIVE mg/dL   Hgb urine dipstick NEGATIVE NEGATIVE   Bilirubin Urine NEGATIVE NEGATIVE   Ketones, ur NEGATIVE NEGATIVE mg/dL   Protein, ur NEGATIVE NEGATIVE mg/dL   Nitrite NEGATIVE NEGATIVE   Leukocytes, UA TRACE (A) NEGATIVE   RBC / HPF 0-5 0 - 5 RBC/hpf   WBC, UA 0-5 0 - 5 WBC/hpf   Bacteria, UA NONE SEEN NONE SEEN   Squamous Epithelial / LPF 0-5 0 - 5   Mucus PRESENT   Lipid panel     Status: Abnormal   Collection Time: 06/10/17  5:45 AM  Result Value Ref Range   Cholesterol 299 (H) 0 - 200 mg/dL   Triglycerides 347 (H) <150 mg/dL   HDL 66 >40 mg/dL    Total CHOL/HDL Ratio 4.5 RATIO   VLDL 69 (H) 0 - 40 mg/dL   LDL Cholesterol 164 (H) 0 - 99 mg/dL   Ct Angio Head W Or Wo Contrast  Result Date: 06/09/2017 CLINICAL DATA:  Generalized numbness beginning in bilateral hands this morning. Follow-up infarct. History of hyperlipidemia. EXAM: CT ANGIOGRAPHY HEAD AND NECK TECHNIQUE: Multidetector CT imaging of the head and neck was performed using the standard protocol during bolus administration of intravenous contrast. Multiplanar CT image reconstructions and MIPs were obtained to evaluate the vascular anatomy. Carotid stenosis measurements (when applicable) are obtained utilizing NASCET criteria, using the distal internal carotid diameter as the denominator. CONTRAST:  46mL ISOVUE-370 IOPAMIDOL (ISOVUE-370) INJECTION 76% COMPARISON:  MRI of the head Jun 09, 2017 FINDINGS: CTA NECK FINDINGS: AORTIC ARCH: Normal appearance of the thoracic arch, normal branch pattern. Mild calcific atherosclerosis. The origins of the innominate, left Common carotid artery and subclavian artery are widely patent. RIGHT CAROTID SYSTEM: Common carotid artery is patent. Normal appearance of the carotid bifurcation without hemodynamically significant stenosis by NASCET criteria. Normal appearance of the internal carotid artery. LEFT CAROTID SYSTEM: Common carotid artery is patent. Normal appearance of the carotid bifurcation without hemodynamically significant stenosis by NASCET criteria. Normal appearance of the internal carotid artery. VERTEBRAL ARTERIES:Left vertebral artery is dominant. Normal appearance of the vertebral arteries, widely patent. SKELETON: No acute osseous process though bone windows have not been submitted. Minimal grade 1 C4-5 anterolisthesis with multilevel advanced upper cervical facet arthropathy better demonstrated on today's MRI of the cervical spine. OTHER NECK: Soft tissues of the neck are nonacute though, not tailored for evaluation. RIGHT chest Port-A-Cath.  UPPER CHEST: Multiple RIGHT upper lobe subsolid pulmonary nodules measuring to 5 mm. No superior mediastinal lymphadenopathy. CTA HEAD FINDINGS: ANTERIOR CIRCULATION: Patent cervical internal carotid arteries, petrous, cavernous and supra clinoid internal carotid arteries. Patent anterior and middle cerebral arteries. Multifocal severe stenosis bilateral A2 and A3 segments. Severe stenosis LEFT M2 inferior division origin. Multifocal severe stenosis bilateral mid to distal MCA including LEFT M3 segment. No large vessel occlusion, contrast extravasation or aneurysm. POSTERIOR CIRCULATION: Patent vertebral arteries, vertebrobasilar junction and basilar artery, as well as main branch vessels. Patent posterior cerebral arteries. Moderate stenosis RIGHT P2 segment. No  large vessel occlusion, contrast extravasation or aneurysm. VENOUS SINUSES: Major dural venous sinuses are patent though not tailored for evaluation on this angiographic examination. ANATOMIC VARIANTS: None. DELAYED PHASE: No abnormal intracranial enhancement. MIP images reviewed. IMPRESSION: CTA NECK: 1. No hemodynamically significant stenosis or acute vascular process in the neck. 2. **An incidental finding of potential clinical significance has been found. Multiple RIGHT upper lobe pulmonary nodules measuring less than 6 mm. Non-contrast chest CT at 3-6 months is recommended. If nodules persist and are stable at that time, consider additional non-contrast chest CT examinations at 2 and 4 years. This recommendation follows the consensus statement: Guidelines for Management of Incidental Pulmonary Nodules Detected on CT Images: From the Fleischner Society 2017; Radiology 2017; 284:228-243.** CTA HEAD: 1. No emergent large vessel occlusion. 2. Multifocal severe stenosis anterior and middle cerebral artery's may be secondary to atherosclerosis or hypertensive vasculopathy. Aortic Atherosclerosis (ICD10-I70.0). Electronically Signed   By: Elon Alas  M.D.   On: 06/09/2017 20:54   Ct Head Wo Contrast  Result Date: 06/09/2017 CLINICAL DATA:  Numbness and weakness. EXAM: CT HEAD WITHOUT CONTRAST TECHNIQUE: Contiguous axial images were obtained from the base of the skull through the vertex without intravenous contrast. COMPARISON:  10/22/2016 FINDINGS: Brain: More prominent hypodensity along the anterior limb of the right internal capsule may represent a subacute lacunar infarct. Older adjacent lacunar infarct of the basal ganglia appears stable. Stable advanced small vessel ischemic disease in the periventricular white matter. The brain demonstrates no evidence of hemorrhage, edema, mass effect, extra-axial fluid collection, hydrocephalus or mass lesion. Vascular: No hyperdense vessel or unexpected calcification. Skull: The skull shows osteopenia without evidence of fracture or bony lesions. Sinuses/Orbits: There are air-fluid levels in both visualized maxillary antra. Mucosal thickening also noted in bilateral ethmoid and sphenoid air cells. Other: None. IMPRESSION: 1. More prominent hypodensity along the anterior limb of the right internal capsule may represent a subacute lacunar infarct. Older lacunar infarcts and advanced small vessel disease appears stable. 2. New air-fluid levels in both maxillary antra as well as mucosal thickening in bilateral ethmoid and sphenoid air cells are suggestive of active sinusitis. Electronically Signed   By: Aletta Edouard M.D.   On: 06/09/2017 15:37   Ct Angio Neck W And/or Wo Contrast  Result Date: 06/09/2017 CLINICAL DATA:  Generalized numbness beginning in bilateral hands this morning. Follow-up infarct. History of hyperlipidemia. EXAM: CT ANGIOGRAPHY HEAD AND NECK TECHNIQUE: Multidetector CT imaging of the head and neck was performed using the standard protocol during bolus administration of intravenous contrast. Multiplanar CT image reconstructions and MIPs were obtained to evaluate the vascular anatomy. Carotid  stenosis measurements (when applicable) are obtained utilizing NASCET criteria, using the distal internal carotid diameter as the denominator. CONTRAST:  42mL ISOVUE-370 IOPAMIDOL (ISOVUE-370) INJECTION 76% COMPARISON:  MRI of the head Jun 09, 2017 FINDINGS: CTA NECK FINDINGS: AORTIC ARCH: Normal appearance of the thoracic arch, normal branch pattern. Mild calcific atherosclerosis. The origins of the innominate, left Common carotid artery and subclavian artery are widely patent. RIGHT CAROTID SYSTEM: Common carotid artery is patent. Normal appearance of the carotid bifurcation without hemodynamically significant stenosis by NASCET criteria. Normal appearance of the internal carotid artery. LEFT CAROTID SYSTEM: Common carotid artery is patent. Normal appearance of the carotid bifurcation without hemodynamically significant stenosis by NASCET criteria. Normal appearance of the internal carotid artery. VERTEBRAL ARTERIES:Left vertebral artery is dominant. Normal appearance of the vertebral arteries, widely patent. SKELETON: No acute osseous process though bone windows have not been  submitted. Minimal grade 1 C4-5 anterolisthesis with multilevel advanced upper cervical facet arthropathy better demonstrated on today's MRI of the cervical spine. OTHER NECK: Soft tissues of the neck are nonacute though, not tailored for evaluation. RIGHT chest Port-A-Cath. UPPER CHEST: Multiple RIGHT upper lobe subsolid pulmonary nodules measuring to 5 mm. No superior mediastinal lymphadenopathy. CTA HEAD FINDINGS: ANTERIOR CIRCULATION: Patent cervical internal carotid arteries, petrous, cavernous and supra clinoid internal carotid arteries. Patent anterior and middle cerebral arteries. Multifocal severe stenosis bilateral A2 and A3 segments. Severe stenosis LEFT M2 inferior division origin. Multifocal severe stenosis bilateral mid to distal MCA including LEFT M3 segment. No large vessel occlusion, contrast extravasation or aneurysm.  POSTERIOR CIRCULATION: Patent vertebral arteries, vertebrobasilar junction and basilar artery, as well as main branch vessels. Patent posterior cerebral arteries. Moderate stenosis RIGHT P2 segment. No large vessel occlusion, contrast extravasation or aneurysm. VENOUS SINUSES: Major dural venous sinuses are patent though not tailored for evaluation on this angiographic examination. ANATOMIC VARIANTS: None. DELAYED PHASE: No abnormal intracranial enhancement. MIP images reviewed. IMPRESSION: CTA NECK: 1. No hemodynamically significant stenosis or acute vascular process in the neck. 2. **An incidental finding of potential clinical significance has been found. Multiple RIGHT upper lobe pulmonary nodules measuring less than 6 mm. Non-contrast chest CT at 3-6 months is recommended. If nodules persist and are stable at that time, consider additional non-contrast chest CT examinations at 2 and 4 years. This recommendation follows the consensus statement: Guidelines for Management of Incidental Pulmonary Nodules Detected on CT Images: From the Fleischner Society 2017; Radiology 2017; 284:228-243.** CTA HEAD: 1. No emergent large vessel occlusion. 2. Multifocal severe stenosis anterior and middle cerebral artery's may be secondary to atherosclerosis or hypertensive vasculopathy. Aortic Atherosclerosis (ICD10-I70.0). Electronically Signed   By: Elon Alas M.D.   On: 06/09/2017 20:54   Mr Brain Wo Contrast  Result Date: 06/09/2017 CLINICAL DATA:  Generalized numbness beginning in upper extremity since this morning, weakness. Recent viral illness, on prednisone. EXAM: MRI HEAD WITHOUT CONTRAST MRI CERVICAL SPINE WITHOUT CONTRAST TECHNIQUE: Multiplanar, multiecho pulse sequences of the brain and surrounding structures, and cervical spine, to include the craniocervical junction and cervicothoracic junction, were obtained without intravenous contrast. COMPARISON:  CT HEAD Jun 09, 2017 and MRI of the cervical spine March 23, 2017 and MRI of the head April 22, 2012 FINDINGS: MRI HEAD FINDINGS INTRACRANIAL CONTENTS: Patchy reduced diffusion LEFT frontoparietal lobes with low ADC values. Subcentimeter reduced diffusion LEFT thalamus with normalized ADC values. No susceptibility artifact to suggest hemorrhage. Confluent supratentorial white matter FLAIR T2 hyperintensities. Patchy pontine T2 hyperintensities. Old RIGHT basal ganglia and RIGHT thalamus cystic small infarcts. Hazy T2 hyperintensities bilateral basal ganglia and bilateral thalami seen with chronic small vessel ischemic changes. No parenchymal brain volume loss for age. No midline shift, mass effect or masses. VASCULAR: Normal major intracranial vascular flow voids present at skull base. SKULL AND UPPER CERVICAL SPINE: No abnormal sellar expansion. No suspicious calvarial bone marrow signal. Craniocervical junction maintained. SINUSES/ORBITS: The mastoid air-cells and included paranasal sinuses are well-aerated.The included ocular globes and orbital contents are non-suspicious. OTHER: None. MRI CERVICAL SPINE FINDINGS ALIGNMENT: Straightened cervical lordosis. Minimal grade 1 C4-5 anterolisthesis. VERTEBRAE/DISCS: Vertebral bodies are intact. Intervertebral disc and demonstrate normal morphology with mild desiccation. No abnormal or acute bone marrow signal. CORD:Cervical spinal cord is normal morphology and signal characteristics from the cervicomedullary junction to level of T2-3, the most caudal well visualized level. POSTERIOR FOSSA, VERTEBRAL ARTERIES, PARASPINAL TISSUES: No MR findings of ligamentous injury.  Vertebral artery flow voids present. Included posterior fossa and paraspinal soft tissues are normal. DISC LEVELS: C2-3: No disc bulge. Uncovertebral hypertrophy and severe facet arthropathy without canal stenosis. Moderate to severe RIGHT neural foraminal narrowing. C3-4: Uncovertebral hypertrophy. Moderate to severe bilateral facet arthropathy. No canal  stenosis. Moderate to severe LEFT neural foraminal narrowing. C4-5: Anterolisthesis. Severe RIGHT and moderate LEFT facet arthropathy with trace RIGHT facet effusion. No canal stenosis or neural foraminal narrowing. C5-6, C6-7: No disc bulge, canal stenosis nor neural foraminal narrowing. C7-T1: No disc bulge. Mild facet arthropathy. No canal stenosis. Mild LEFT neural foraminal narrowing. IMPRESSION: MRI HEAD: 1. Patchy acute nonhemorrhagic infarcts LEFT frontoparietal/MCA territories. Subacute subcentimeter LEFT thalamus infarct. 2. Old RIGHT basal ganglia and old RIGHT thalamus small infarcts. Severe chronic small vessel ischemic changes. MRI CERVICAL SPINE: 1. Progressed degenerative change of the cervical spine, grade 1 C4-5 anterolisthesis. 2. No canal stenosis. Moderate to severe C2-3 and C3-4 neural foraminal narrowing. Electronically Signed   By: Elon Alas M.D.   On: 06/09/2017 18:59   Mr Cervical Spine Wo Contrast  Result Date: 06/09/2017 CLINICAL DATA:  Generalized numbness beginning in upper extremity since this morning, weakness. Recent viral illness, on prednisone. EXAM: MRI HEAD WITHOUT CONTRAST MRI CERVICAL SPINE WITHOUT CONTRAST TECHNIQUE: Multiplanar, multiecho pulse sequences of the brain and surrounding structures, and cervical spine, to include the craniocervical junction and cervicothoracic junction, were obtained without intravenous contrast. COMPARISON:  CT HEAD Jun 09, 2017 and MRI of the cervical spine March 23, 2017 and MRI of the head April 22, 2012 FINDINGS: MRI HEAD FINDINGS INTRACRANIAL CONTENTS: Patchy reduced diffusion LEFT frontoparietal lobes with low ADC values. Subcentimeter reduced diffusion LEFT thalamus with normalized ADC values. No susceptibility artifact to suggest hemorrhage. Confluent supratentorial white matter FLAIR T2 hyperintensities. Patchy pontine T2 hyperintensities. Old RIGHT basal ganglia and RIGHT thalamus cystic small infarcts. Hazy T2  hyperintensities bilateral basal ganglia and bilateral thalami seen with chronic small vessel ischemic changes. No parenchymal brain volume loss for age. No midline shift, mass effect or masses. VASCULAR: Normal major intracranial vascular flow voids present at skull base. SKULL AND UPPER CERVICAL SPINE: No abnormal sellar expansion. No suspicious calvarial bone marrow signal. Craniocervical junction maintained. SINUSES/ORBITS: The mastoid air-cells and included paranasal sinuses are well-aerated.The included ocular globes and orbital contents are non-suspicious. OTHER: None. MRI CERVICAL SPINE FINDINGS ALIGNMENT: Straightened cervical lordosis. Minimal grade 1 C4-5 anterolisthesis. VERTEBRAE/DISCS: Vertebral bodies are intact. Intervertebral disc and demonstrate normal morphology with mild desiccation. No abnormal or acute bone marrow signal. CORD:Cervical spinal cord is normal morphology and signal characteristics from the cervicomedullary junction to level of T2-3, the most caudal well visualized level. POSTERIOR FOSSA, VERTEBRAL ARTERIES, PARASPINAL TISSUES: No MR findings of ligamentous injury. Vertebral artery flow voids present. Included posterior fossa and paraspinal soft tissues are normal. DISC LEVELS: C2-3: No disc bulge. Uncovertebral hypertrophy and severe facet arthropathy without canal stenosis. Moderate to severe RIGHT neural foraminal narrowing. C3-4: Uncovertebral hypertrophy. Moderate to severe bilateral facet arthropathy. No canal stenosis. Moderate to severe LEFT neural foraminal narrowing. C4-5: Anterolisthesis. Severe RIGHT and moderate LEFT facet arthropathy with trace RIGHT facet effusion. No canal stenosis or neural foraminal narrowing. C5-6, C6-7: No disc bulge, canal stenosis nor neural foraminal narrowing. C7-T1: No disc bulge. Mild facet arthropathy. No canal stenosis. Mild LEFT neural foraminal narrowing. IMPRESSION: MRI HEAD: 1. Patchy acute nonhemorrhagic infarcts LEFT  frontoparietal/MCA territories. Subacute subcentimeter LEFT thalamus infarct. 2. Old RIGHT basal ganglia and old RIGHT thalamus small infarcts. Severe  chronic small vessel ischemic changes. MRI CERVICAL SPINE: 1. Progressed degenerative change of the cervical spine, grade 1 C4-5 anterolisthesis. 2. No canal stenosis. Moderate to severe C2-3 and C3-4 neural foraminal narrowing. Electronically Signed   By: Elon Alas M.D.   On: 06/09/2017 18:59   Dg Chest Port 1 View  Result Date: 06/09/2017 CLINICAL DATA:  Weakness EXAM: PORTABLE CHEST 1 VIEW COMPARISON:  01/02/2017 FINDINGS: Porta catheter on the right with tip at the upper cavoatrial junction. Borderline heart size. Chronic right diaphragm elevation with scarring at the right base. There is no edema, consolidation, effusion, or pneumothorax. No acute osseous finding. Distal right clavicle resection. IMPRESSION: Stable from prior.  No evidence of active disease. Electronically Signed   By: Monte Fantasia M.D.   On: 06/09/2017 16:13   Dg Swallowing Func-speech Pathology  Result Date: 06/10/2017 Objective Swallowing Evaluation: Type of Study: MBS-Modified Barium Swallow Study  Patient Details Name: [name] [name] MRN: [id number] Date of Birth: [birth year] Today's Date: 06/10/2017 Time: SLP Start Time (ACUTE ONLY): 0825 -SLP Stop Time (ACUTE ONLY): 0859 SLP Time Calculation (min) (ACUTE ONLY): 34 min Past Medical History: Past Medical History: Diagnosis Date . Allergic rhinitis, cause unspecified  . Benign neoplasm of colon  . Esophageal reflux  . Excessive daytime sleepiness  . 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 GI hemorrhage 2011  recurrent . Hyperlipidemia  . Osteoporosis  . Polymyositis ([place])  . Unspecified essential hypertension  . Vitamin D deficiency  Past Surgical History: Past Surgical History: Procedure Laterality Date . ABDOMINAL HYSTERECTOMY    partial . bilateral  rotator cuff surgery   . COLONOSCOPY  01/09/2011  others also .  COLONOSCOPY  05/29/2011  Procedure: COLONOSCOPY;  Surgeon: Gatha Mayer, MD;  Location: [facility];  Service: Endoscopy;  Laterality: N/A;  Greggory Brandy Carlean Purl . COLONOSCOPY WITH PROPOFOL N/A 02/16/2014  Procedure: COLONOSCOPY WITH PROPOFOL;  Surgeon: Milus Banister, MD;  Location: [facility];  Service: Endoscopy;  Laterality: N/A; . ESOPHAGOGASTRODUODENOSCOPY  12/08/2010  others also . ESOPHAGOGASTRODUODENOSCOPY (EGD) WITH PROPOFOL N/A 02/16/2014  Procedure: ESOPHAGOGASTRODUODENOSCOPY (EGD) WITH PROPOFOL;  Surgeon: Milus Banister, MD;  Location: [facility];  Service: Endoscopy;  Laterality: N/A; . EUS   . HOT HEMOSTASIS  05/29/2011  Procedure: HOT HEMOSTASIS (ARGON PLASMA COAGULATION/BICAP);  Surgeon: Gatha Mayer, MD;  Location: [facility];  Service: Endoscopy;  Laterality: N/A; . LUMBAR LAMINECTOMY   . 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  Pt initially passed RNSSS but then was noted to have foaming secretions in mouth and drooling.  Pt is wheelchair bound and has chronic upper body weakness.  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  SLP advised to order MBS in lieu of clinical evaluation.   Subjective: pt awake in chair Assessment / Plan / Recommendation CHL IP CLINICAL IMPRESSIONS 06/10/2017 Clinical Impression Pt presents with overall functional oropharyngeal swallow.  NO aspiration/consistent laryngeal penetration of consistencies tested.  Pt did have a single episode of laryngeal penetration to vocal folds with thin x1 clearing independently.  Of note, pt did cough during MBS but barium was not visualized in larynx.  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   SLP Visit Diagnosis Dysphagia, pharyngoesophageal phase (R13.14) Attention and concentration deficit following --  Frontal lobe and executive function deficit  following -- Impact on safety and function Moderate aspiration risk   CHL IP TREATMENT RECOMMENDATION 06/10/2017 Treatment Recommendations Therapy as outlined in treatment plan below   Prognosis 06/10/2017 Prognosis for Safe Diet Advancement Guarded Barriers to Reach Goals -- Barriers/Prognosis Comment -- CHL IP DIET RECOMMENDATION 06/10/2017 SLP Diet Recommendations Dysphagia 3 (Mech soft) solids;Thin liquid Liquid Administration via Cup;Straw Medication Administration Whole meds with puree Compensations Slow rate;Small sips/bites Postural Changes Seated upright at 90 degrees;Remain semi-upright after after feeds/meals (Comment)   CHL IP OTHER RECOMMENDATIONS 06/10/2017 Recommended Consults -- Oral Care Recommendations Oral care BID Other Recommendations --   CHL IP FOLLOW UP RECOMMENDATIONS 06/10/2017 Follow up Recommendations None   CHL IP FREQUENCY AND DURATION 06/10/2017 Speech Therapy Frequency (ACUTE ONLY) min 1 x/week Treatment Duration 1 week      CHL IP ORAL PHASE 06/10/2017 Oral Phase WFL Oral - Pudding Teaspoon -- Oral - Pudding Cup -- Oral - Honey Teaspoon -- Oral - Honey Cup -- Oral - Nectar Teaspoon WFL Oral - Nectar Cup -- Oral - Nectar Straw WFL Oral - Thin Teaspoon WFL Oral - Thin Cup WFL Oral - Thin Straw WFL Oral - Puree WFL Oral - Mech Soft -- Oral - Regular WFL Oral - Multi-Consistency -- Oral - Pill WFL Oral Phase - Comment --  CHL IP PHARYNGEAL PHASE 06/10/2017 Pharyngeal Phase Impaired Pharyngeal- Pudding Teaspoon -- Pharyngeal -- Pharyngeal- Pudding Cup -- Pharyngeal -- Pharyngeal- Honey Teaspoon -- Pharyngeal -- Pharyngeal- Honey Cup -- Pharyngeal -- Pharyngeal- Nectar Teaspoon Delayed swallow initiation-vallecula Pharyngeal -- Pharyngeal- Nectar Cup -- Pharyngeal -- Pharyngeal- Nectar Straw Delayed swallow initiation-vallecula Pharyngeal -- Pharyngeal- Thin Teaspoon Delayed swallow initiation-vallecula;WFL Pharyngeal -- Pharyngeal- Thin Cup WFL;Delayed  swallow initiation-vallecula Pharyngeal -- Pharyngeal- Thin Straw Penetration/Aspiration before swallow;Delayed swallow initiation-vallecula;Delayed swallow initiation-pyriform sinuses Pharyngeal Material enters airway, CONTACTS cords and then ejected out Pharyngeal- Puree WFL Pharyngeal -- Pharyngeal- Mechanical Soft -- Pharyngeal -- Pharyngeal- Regular WFL Pharyngeal -- Pharyngeal- Multi-consistency -- Pharyngeal -- Pharyngeal- Pill WFL Pharyngeal -- Pharyngeal Comment trace silent laryngeal penetration of thin via straw to vocal cords x1 of 5 boluses   CHL IP CERVICAL ESOPHAGEAL PHASE 06/10/2017 Cervical Esophageal Phase Impaired Pudding Teaspoon -- Pudding Cup -- Honey Teaspoon -- Honey Cup -- Nectar Teaspoon Reduced cricopharyngeal relaxation;Prominent cricopharyngeal segment Nectar Cup -- Nectar Straw Reduced cricopharyngeal relaxation;Prominent cricopharyngeal segment Thin Teaspoon Reduced cricopharyngeal relaxation;Prominent cricopharyngeal segment Thin Cup Reduced cricopharyngeal relaxation;Prominent cricopharyngeal segment Thin Straw Reduced cricopharyngeal relaxation;Prominent cricopharyngeal segment Puree Reduced cricopharyngeal relaxation;Prominent cricopharyngeal segment Mechanical Soft -- Regular Reduced cricopharyngeal relaxation;Prominent cricopharyngeal segment Multi-consistency -- Pill Prominent cricopharyngeal segment;Reduced cricopharyngeal relaxation Cervical Esophageal Comment appearance of possibly developing prominent cricopharyngeus that may be developing into Zenker's diverticulum,  No flowsheet data found. Macario Golds 06/10/2017, 9:35 AM    Luanna Salk, MS [facility] SLP [phone]            Assessment/Plan: Diagnosis: infarct left frontoparietal MCA Labs and images independently reviewed.  Records reviewed and summated above. Stroke: Continue secondary stroke prophylaxis and Risk Factor Modification listed below:   Antiplatelet therapy:   Blood Pressure Management:  Continue  current medication with prn's with permisive HTN per primary team Statin Agent:   Right sided hemiparesis: fit for orthosis to prevent contractures (resting hand splint for day, wrist cock up splint at night, PRAFO, etc) Motor recovery: Fluoxetine  1. Does the need for close, 24 hr/day medical supervision in concert with the patient's rehab needs make it unreasonable for this patient to be served in a  less intensive setting? Yes  2. Co-Morbidities requiring supervision/potential complications: HTN (monitor and provide prns in accordance with increased physical exertion and pain), hypokalemia (continue to monitor and replete as necessary), polymyositis, diastolic congestive heart failure (monitor for signs and symptoms of fluid overload), colonic AVM, acute blood loss anemia (transfuse if necessary to ensure appropriate perfusion for increased activity tolerance) 3. Due to bladder management, safety, skin/wound care, disease management and patient education, does the patient require 24 hr/day rehab nursing? Yes 4. Does the patient require coordinated care of a physician, rehab nurse, PT (1-2 hrs/day, 5 days/week), OT (1-2 hrs/day, 5 days/week) and SLP (1-2 hrs/day, 5 days/week) to address physical and functional deficits in the context of the above medical diagnosis(es)? Yes Addressing deficits in the following areas: balance, endurance, locomotion, strength, transferring, bathing, dressing, toileting, speech, swallowing and psychosocial support 5. Can the patient actively participate in an intensive therapy program of at least 3 hrs of therapy per day at least 5 days per week? Potentially 6. The potential for patient to make measurable gains while on inpatient rehab is good 7. Anticipated functional outcomes upon discharge from inpatient rehab are min assist and mod assist  with PT, min assist and mod assist with OT, modified independent and supervision with SLP. 8. Estimated rehab length of stay to  reach the above functional goals is: 17-20 days. 9. Anticipated D/C setting: Home 10. Anticipated post D/C treatments: HH therapy and Home excercise program 11. Overall Rehab/Functional Prognosis: good and fair  RECOMMENDATIONS: This patient's condition is appropriate for continued rehabilitative care in the following setting: CIR if adequate caregiver support available upon discharge after completion of medical work-up. Patient has agreed to participate in recommended program. Potentially Note that insurance prior authorization may be required for reimbursement for recommended care.  Comment: Rehab Admissions Coordinator to follow up.   I have personally performed a face to face diagnostic evaluation, including, but not limited to relevant history and physical exam findings, of this patient and developed relevant assessment and plan.  Additionally, I have reviewed and concur with the physician assistant's documentation above.   Delice Lesch, MD, ABPMR Lavon Paganini Angiulli, PA-C 06/10/2017

## 2017-06-10 NOTE — Consult Note (Signed)
Neurology Consultation  Reason for Consult: Stroke Referring Physician: Dr. Lita Mains  CC: Speech problems, numbness  History is obtained from: Chart  HPI: [name] [name] is a 77 y.o. female past medical history of hypertension, polymyositis, diastolic CHF, colonic AVM, was brought into the [place] emergency room on 06/09/2017 with complaints of generalized numbness and weakness that started around 5 AM on the morning of 06/09/2017.  At baseline, patient is wheelchair-bound and has chronic bilateral upper extremity weakness attributable to mild polymyositis.  The patient did not provide me good history as I had examined her around 1 AM at [facility] and she just woke up from sleep. Per chart review, patient's son, who is the primary caregiver, reported some change in speech- specifically listed as not slurring but slowness of speech associated with this.  The patient has been sick with a cough with productive sputum for 2 weeks. In the emergency room, her exam was nonfocal, but given the history of speech change and numbness, further imaging was pursued in the form of an MRI. MRI of the brain revealed scattered left MCA territory strokes, following which a neurological consultation was obtained for further work-up   LKW: Unclear- symptoms witnessed first at 5 AM on 06/09/2017.  Assuming last seen well was the night prior tpa given?: no, outside the window Premorbid modified Rankin scale (mRS): 4   ROS: Unable to obtain due to altered mental status.   Past Medical History:  Diagnosis Date  . Allergic rhinitis, cause unspecified   . Benign neoplasm of colon   . Esophageal reflux   . Excessive daytime sleepiness   . 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 GI hemorrhage 2011   recurrent  . Hyperlipidemia   . Osteoporosis   . Polymyositis ([place])   . Unspecified essential hypertension   . Vitamin D deficiency     Family History  Problem Relation Age of Onset  .  Dementia Mother   . Hypertension Father   . Malignant hyperthermia Father   . Colon cancer Neg Hx     Social History:   reports that she has never smoked. She has quit using smokeless tobacco. Her smokeless tobacco use included snuff. She reports that she does not drink alcohol or use drugs.   Medications  Current Facility-Administered Medications:  .  hydrALAZINE (APRESOLINE) injection 10 mg, 10 mg, Intravenous, Q4H PRN, Emokpae, Ejiroghene E, MD .  LORazepam (ATIVAN) injection 0.5 mg, 0.5 mg, Intravenous, Once, Julianne Rice, MD  Current Outpatient Medications:  .  allopurinol (ZYLOPRIM) 300 MG tablet, Take 300 mg by mouth daily., Disp: , Rfl: 10 .  Ascorbic Acid (VITAMIN C) 1000 MG tablet, Take 1,000 mg by mouth daily. , Disp: , Rfl:  .  atenolol (TENORMIN) 50 MG tablet, Take 50 mg by mouth daily.  , Disp: , Rfl:  .  Calcium Carbonate (CALTRATE 600) 1500 MG TABS, Take 2 tablets by mouth daily. , Disp: , Rfl:  .  Cholecalciferol (VITAMIN D PO), Take 2 tablets by mouth daily. , Disp: , Rfl:  .  DUREZOL 0.05 % EMUL, Place 1 drop into both eyes daily., Disp: , Rfl:  .  esomeprazole (NEXIUM) 40 MG capsule, Take one capsule by thirty minutes before breakfast Take one capsule by thirty minutes before dinner (Patient taking differently: Take 40 mg by mouth 2 (two) times daily before a meal. Take one capsule by thirty minutes before breakfast Take one capsule by thirty minutes before  dinner), Disp: 60 capsule, Rfl: 3 .  folic acid (FOLVITE) 875 MCG tablet, Take 400 mcg by mouth daily.  , Disp: , Rfl:  .  furosemide (LASIX) 40 MG tablet, Take 40 mg by mouth daily.  , Disp: , Rfl:  .  mycophenolate (CELLCEPT) 500 MG tablet, 500 mg TWICE DAILY, Disp: , Rfl: 1 .  omega-3 acid ethyl esters (LOVAZA) 1 g capsule, Take 2 g by mouth 2 (two) times daily. , Disp: , Rfl: 10 .  OVER THE COUNTER MEDICATION, Take 1 capsule by mouth daily. Beet Root, Disp: , Rfl:  .  PAPAYA PO, Take 1 tablet by mouth  daily., Disp: , Rfl:  .  potassium chloride SA (K-DUR,KLOR-CON) 20 MEQ tablet, Take 2 tablets (40 mEq total) by mouth 2 (two) times daily., Disp: 20 tablet, Rfl: 0 .  predniSONE (DELTASONE) 20 MG tablet, TK 1 T PO ONCE D FOR 6 DAYS, Disp: , Rfl: 0 .  PROLENSA 0.07 % SOLN, Place 1 drop into both eyes daily. As directed, Disp: , Rfl:  .  psyllium (HYDROCIL/METAMUCIL) 95 % PACK, Take 1 packet by mouth daily. (Patient taking differently: Take 1 packet by mouth daily as needed (gas). ), Disp: 56 each, Rfl: 6 .  ULORIC 40 MG tablet, Take 40 mg by mouth daily., Disp: , Rfl:  .  VITAMIN A PO, Take 1 tablet by mouth daily., Disp: , Rfl:  .  vitamin B-12 (CYANOCOBALAMIN) 1000 MCG tablet, Take 1,000 mcg by mouth daily.  , Disp: , Rfl:  .  VITAMIN E PO, Take 1 tablet by mouth., Disp: , Rfl:  .  acetaminophen (TYLENOL 8 HOUR) 650 MG CR tablet, Take 1 tablet (650 mg total) by mouth every 8 (eight) hours as needed. (Patient taking differently: Take 650 mg by mouth every 8 (eight) hours as needed for pain. ), Disp: 30 tablet, Rfl: 0 .  ALPRAZolam (XANAX) 0.25 MG tablet, Take 0.25 mg by mouth 2 (two) times daily as needed for anxiety or sleep. , Disp: , Rfl:  .  benzonatate (TESSALON) 100 MG capsule, TK 1 C PO TID PRN COUGH, Disp: , Rfl: 0 .  CARAFATE 1 GM/10ML suspension, Take 1 g by mouth 2 (two) times daily as needed (GAS/INDIGESTION). , Disp: , Rfl:  .  colchicine 0.6 MG tablet, Take 0.6 mg by mouth. Take 1 tablet by mouth every 8 hours as needed for gout flare, Disp: , Rfl: 5 .  fluticasone (FLONASE) 50 MCG/ACT nasal spray, Place 1 spray into both nostrils daily as needed for allergies. , Disp: , Rfl:  .  gabapentin (NEURONTIN) 100 MG capsule, TAKE ONE CAPSULE BY MOUTH TWICE DAILY (Patient taking differently: TAKE ONE CAPSULE BY MOUTH TWICE DAILY AS NEEDED FOR NERVE PAIN), Disp: 30 capsule, Rfl: 0 .  loratadine (CLARITIN) 10 MG tablet, Take 10 mg by mouth daily as needed. For allergies., Disp: , Rfl:  .  sodium  chloride (OCEAN) 0.65 % nasal spray, Place 1 spray into the nose at bedtime as needed for congestion (allergies). , Disp: , Rfl:   Exam: Current vital signs: BP (!) 160/118   Pulse 64   Temp (!) 97.3 F (36.3 C)   Resp (!) 22   Ht 5\' 3"  (1.6 m)   Wt 77.1 kg (170 lb)   SpO2 100%   BMI 30.11 kg/m  Vital signs in last 24 hours: Temp:  [97.3 F (36.3 C)-97.4 F (36.3 C)] 97.3 F (36.3 C) (05/28 1714) Pulse Rate:  [  40-130] 64 (05/28 2330) Resp:  [16-25] 22 (05/28 2200) BP: (135-185)/(72-118) 160/118 (05/28 2345) SpO2:  [96 %-100 %] 100 % (05/28 2330) Weight:  [77.1 kg (170 lb)] 77.1 kg (170 lb) (05/28 1450) General: Patient is awake, alert, following all commands, a phasic. HEENT: Normocephalic, atraumatic CVS: S1-S2 heard, regular rate rhythm Abdomen: Soft nondistended nontender Neurological exam Patient is awake, alert, following all commands. Her speech was abnormal-she was unable to name or repeat.  She was able to comprehend all commands.  She also mimicked all commands. Cranial nerves: Pupils equal round reactive to light, extra ocular movements intact, visual fields full, right nasolabial fold flattening, uvula midline, tongue midline. Motor exam: She is symmetrically weak and able to go antigravity in both her upper extremities but both her arms fall down to the bed before 10 seconds.  In her lower extremities, she was barely antigravity in both her lower extremities but the right side seemed weaker. Sensory exam: Is intact to light touch all over Coordination: Was unable to perform finger-nose-finger test for me  NIHSS 1a Level of Conscious.: 0 1b LOC Questions: 2 1c LOC Commands: 0 2 Best Gaze: 0 3 Visual: 0 4 Facial Palsy: 1 5a Motor Arm - left: 1 5b Motor Arm - Right: 1 6a Motor Leg - Left: 2 6b Motor Leg - Right: 2 7 Limb Ataxia: 0 8 Sensory: 0 9 Best Language: 2 10 Dysarthria: 0 11 Extinct. and Inatten.:2  TOTAL: 13  Labs I have reviewed labs in epic  and the results pertinent to this consultation are:  CBC    Component Value Date/Time   WBC 7.4 06/09/2017 1648   RBC 3.72 (L) 06/09/2017 1648   HGB 10.5 (L) 06/09/2017 1656   HCT 31.0 (L) 06/09/2017 1656   PLT 296 06/09/2017 1648   MCV 83.9 06/09/2017 1648   MCH 25.8 (L) 06/09/2017 1648   MCHC 30.8 06/09/2017 1648   RDW 17.8 (H) 06/09/2017 1648   LYMPHSABS 2.4 06/09/2017 1648   MONOABS 0.7 06/09/2017 1648   EOSABS 0.1 06/09/2017 1648   BASOSABS 0.0 06/09/2017 1648   CMP     Component Value Date/Time   NA 142 06/09/2017 1656   K 3.6 06/09/2017 1656   CL 105 06/09/2017 1656   CO2 25 06/09/2017 1648   GLUCOSE 81 06/09/2017 1656   BUN 9 06/09/2017 1656   CREATININE 0.50 06/09/2017 1656   CALCIUM 9.2 06/09/2017 1648   PROT 6.2 (L) 06/09/2017 1648   ALBUMIN 3.6 06/09/2017 1648   AST 24 06/09/2017 1648   ALT 18 06/09/2017 1648   ALKPHOS 49 06/09/2017 1648   BILITOT 0.5 06/09/2017 1648   GFRNONAA >60 06/09/2017 1648   GFRAA >60 06/09/2017 1648   Lipid Panel     Component Value Date/Time   CHOL 295 (H) 09/20/2014 0930   TRIG 209 (H) 09/20/2014 0930   HDL 73 09/20/2014 0930   CHOLHDL 4.0 09/20/2014 0930   VLDL 42 (H) 09/20/2014 0930   LDLCALC 180 (H) 09/20/2014 0930   Imaging I have reviewed the images obtained:  CT-scan of the brain- multiple old lacunar infarcts.  No evidence of evolving ischemic stroke. CT angios head and neck showed no hemodynamically significant stenosis.  Incidental finding of potential clinical significance reported by radiology-multiple right upper lobe pulmonary nodules measuring less than 6 mm for which they recommended a noncontrast chest CT at 3 to 6 months. MRI examination of the brain-patchy infarcts in the left MCA territory, also subacute left  thalamic infarct.  Old right basal ganglia and old right thalamic small infarcts with severe chronic small vessel ischemic changes. An MRI examination of the C-spine was done which showed progressed  degenerative changes of C-spine grade 1 C4-5 anterolisthesis and moderate to severe C2-3 and C4 neuroforaminal narrowing  Assessment:  77 year old woman with past history of hypertension, polymyositis, diastolic CHF and colonic AVM was brought into [facility] emergency room for evaluation of generalized numbness, weakness as well as speech difficulties. On examination, which was done at a very inopportune time as the patient was sleeping and just woken up from deep sleep, she did have expressive aphasia and generalized weakness but some focal worse weakness on the right side. MR showed left MCA territory scattered infarcts.  CT Angie head and neck did not reveal any carotid occlusion or stenosis to explain the infarct. Given her history of CHF, I suspect cardio embolic source.  We should look for underlying paroxysmal atrial fibrillation with cardiac monitoring.  Impression: Acute ischemic stroke in the left MCA territory-?  [place] versus cardioembolic  Recommendations: Admit to hospitalist at [facility] on telemetry Aspirin 325 by mouth daily Atorvastatin 80 mg by mouth daily 2D echocardiogram Hemoglobin A1c Lipid panel Frequent neurochecks Physical therapy Speech therapy Occupational therapy Keep n.p.o. until passes bedside or formal swallow eval  Stroke service will follow with you. Please call with questions  -- Amie Portland, MD [facility] Pager: [phone] If 7pm to 7am, please call on call as listed on AMION.

## 2017-06-10 NOTE — Progress Notes (Signed)
Rehab Admissions Coordinator Note:  Patient was screened by Cleatrice Burke for appropriateness for an Inpatient Acute Rehab Consult per PT recommendation.  At this time, we are recommending Inpatient Rehab consult. I will contact Dr. [name] for order.  Cleatrice Burke 06/10/2017, 1:29 PM  I can be reached at [phone].

## 2017-06-10 NOTE — Progress Notes (Signed)
  Echocardiogram 2D Echocardiogram has been performed.  Rafay Dahan G Keigen Caddell 06/10/2017, 3:56 PM

## 2017-06-10 NOTE — Progress Notes (Signed)
Received report from [place] at [facility].  Patient arrived to floor with white foamy substance dried around bottom lip and drooling saliva out of corner of mouth.  Tried to repeat stroke swallow evaluation but pt could not stop coughing and RN decided to make pt NPO and order speech evaluation instead.

## 2017-06-10 NOTE — Progress Notes (Signed)
Pt refused oral care.

## 2017-06-10 NOTE — Progress Notes (Signed)
Initial Nutrition Assessment  DOCUMENTATION CODES:   Not applicable  INTERVENTION:  Ensure Enlive po BID, each supplement provides 350 kcal and 20 grams of protein  NUTRITION DIAGNOSIS:   Inadequate oral intake related to acute illness as evidenced by meal completion < 50%.  GOAL:   Patient will meet greater than or equal to 90% of their needs  MONITOR:   PO intake, Supplement acceptance, Weight trends  REASON FOR ASSESSMENT:   Malnutrition Screening Tool    ASSESSMENT:   [name] [name] is a 77 yo female with PMH HTN, Polymoysitis, Diastolic CHF, AVM of colon who presents with numbness, MRI demonstrates acute infarcts  RD drawn to patient for positive MST. Spoke with patient at bedside. She reports decreased appetite for 2 weeks, would eat once a day, often a small portion of pancakes, grits or cereal because "I'm just not hungry." She ate maybe 25% of lunch today. She states prior to these two weeks she was eating well, eating twice a day. She reports a 10 pound weight loss over 3 months from 180 pounds to 170 pounds, insignificant for time frame. Patient is total care, wheelchair bound with chronic bilateral upper extremity weakness related to mild polymyositis. She is unable to feed herself. It is unclear how long she has been this way, but she exhibits muscle wasting likely related to this.  Would not diagnose with malnutrition based on this. Monitor PO intake and supplement acceptance.  Labs reviewed Medications reviewed and include:  Calcium Carbonate  NUTRITION - FOCUSED PHYSICAL EXAM:    Most Recent Value  Orbital Region  Mild depletion  Upper Arm Region  Severe depletion  Thoracic and Lumbar Region  Unable to assess  Buccal Region  Mild depletion  Temple Region  Mild depletion  Clavicle Bone Region  Mild depletion  Clavicle and Acromion Bone Region  Severe depletion  Scapular Bone Region  Unable to assess  Dorsal Hand  No depletion  Patellar Region   Moderate depletion  Anterior Thigh Region  Moderate depletion  Posterior Calf Region  Moderate depletion       Diet Order:   Diet Order           DIET DYS 3 Room service appropriate? Yes; Fluid consistency: Thin  Diet effective now          EDUCATION NEEDS:   Not appropriate for education at this time  Skin:  Skin Assessment: Reviewed RN Assessment  Last BM:  PTA  Height:   Ht Readings from Last 1 Encounters:  06/09/17 5\' 3"  (1.6 m)    Weight:   Wt Readings from Last 1 Encounters:  06/09/17 170 lb (77.1 kg)    Ideal Body Weight:  52.27 kg  BMI:  Body mass index is 30.11 kg/m.  Estimated Nutritional Needs:   Kcal:  1600-1900 calories  Protein:  93-116 grams  Fluid:  >1.6L  Satira Anis. Tijana Walder, MS, RD LDN Inpatient Clinical Dietitian Pager [phone]

## 2017-06-10 NOTE — Progress Notes (Addendum)
Stroke Team Progress Note  HPI: ) Dr Rory Percy )[name] [name] is a 76 y.o. female past medical history of hypertension, polymyositis, diastolic CHF, colonic AVM, was brought into the [facility] emergency room on 06/09/2017 with complaints of generalized numbness and weakness that started around 5 AM on the morning of 06/09/2017.  At baseline, patient is wheelchair-bound and has chronic bilateral upper extremity weakness attributable to mild polymyositis.  The patient did not provide me good history as I had examined her around 1 AM at [facility] and she just woke up from sleep. Per chart review, patient's son, who is the primary caregiver, reported some change in speech- specifically listed as not slurring but slowness of speech associated with this.  The patient has been sick with a cough with productive sputum for 2 weeks. In the emergency room, her exam was nonfocal, but given the history of speech change and numbness, further imaging was pursued in the form of an MRI. MRI of the brain revealed scattered left MCA territory strokes, following which a neurological consultation was obtained for further work-up   LKW: Unclear- symptoms witnessed first at 5 AM on 06/09/2017.  Assuming last seen well was the night prior tpa given?: no, outside the window Premorbid modified Rankin scale (mRS): 4    SUBJECTIVE Patient was alone in the room. I obtain detailed history of present illness from the patient. She has long-standing history of polymyositis greater than 10 years which was diagnosed in [place]. She is on CellCept and prednisone when necessary. She is bedridden at baseline with significant proximal weakness. She complains of increased speech difficulties and right-sided weakness. OBJECTIVE Most recent Vital Signs: Temp: 97.7 F (36.5 C) (05/29 1125) Temp Source: Oral (05/29 1125) BP: 135/74 (05/29 1125) Pulse Rate: 73 (05/29 1125) Respiratory Rate: 18 O2 Saturdation: 99%  CBG  (last 3)  No results for input(s): GLUCAP in the last 72 hours.     Studies:  CT head More prominent hypodensity along the anterior limb of the right internal capsule may represent a subacute lacunar infarct. Older lacunar infarcts and advanced small vessel disease appears stable MRI brain Patchy reduced diffusion LEFT frontoparietal lobes with low ADC values. Subcentimeter reduced diffusion LEFT thalamus with normalized ADC values CTAs :  No hemodynamically significant stenosis in the neck . Multifocal severe stenosis of the anterior and middle cerebral arteries due to atherosclerosis intracranially. ECHO pending LDL164 mg percent HbA1c pending  Physical Exam:    Elderly African-American lady who looks frail and malnourished. . Afebrile. Head is nontraumatic. Neck is supple without bruit.    Cardiac exam no murmur or gallop. Lungs are clear to auscultation. Distal pulses are well felt. Neurological Exam ; She is awake and alert but disoriented. She follows simple one and some two-step commands. Speech is dysarthric but can be understood. Extraocular moments are full range without nystagmus. She blinks to threat bilaterally. Fundi were not visualized. Mild right lower facial weakness. Tongue midline. Motor system exam reveals bilateral proximal weakness in both shoulders as well as legs. She has weakness of right grip and intrinsic hand muscles. She has good grip on the left. She has only grade 2/5 strength in both upper and lower extremities but right side is weaker than left. Deep tendon reflexes are depressed. Plantars are downgoing. Sensation appears intact. Unable to perform finger-to-nose and needle coordination. Gait not tested. ASSESSMENT [name] [name] is a 77 y.o. female with  Long-standing history of polymyositis and significant quadriparesis  was bedridden and has vascular risk factors of hypertension, hyperlipidemia and intracranial atherosclerosis who has presented with  multifocal left hemispheric infarct  Hospital day # 0  TREATMENT/PLAN  I have personally examined this patient, reviewed notes, independently viewed imaging studies, participated in medical decision making and plan of care.ROS completed by me personally and pertinent positives fully documented  I have made any additions or clarifications directly to the above note.  Recommend dual antiplatelet therapy of aspirin and Plavix for 3 months due to severe intracranial atherosclerosis. Continue ongoing stroke workup. Aggressive risk factor modification. Add statin for elevated lipids despite her muscle weakness from polymyositis to lower stroke risk. No family available at the bedside for discussion. Discussed with Dr. Gloris Ham.  I spent  35  minutes in total face-to-face time with the patient, more than 50% of which was spent in counseling and coordination of care, reviewing test results, reviewing medication and discussing or reviewing the diagnosis of strokes and polymyositis   , the prognosis and treatment options.   Antony Contras, MD Medical Director [facility] Pager: [phone] 06/10/2017 3:49 PM

## 2017-06-11 DIAGNOSIS — M810 Age-related osteoporosis without current pathological fracture: Secondary | ICD-10-CM | POA: Diagnosis present

## 2017-06-11 DIAGNOSIS — I6300 Cerebral infarction due to thrombosis of unspecified precerebral artery: Secondary | ICD-10-CM

## 2017-06-11 DIAGNOSIS — M3320 Polymyositis, organ involvement unspecified: Secondary | ICD-10-CM | POA: Diagnosis present

## 2017-06-11 DIAGNOSIS — I63519 Cerebral infarction due to unspecified occlusion or stenosis of unspecified middle cerebral artery: Secondary | ICD-10-CM | POA: Diagnosis present

## 2017-06-11 DIAGNOSIS — Z8673 Personal history of transient ischemic attack (TIA), and cerebral infarction without residual deficits: Secondary | ICD-10-CM | POA: Diagnosis not present

## 2017-06-11 DIAGNOSIS — Z87891 Personal history of nicotine dependence: Secondary | ICD-10-CM | POA: Diagnosis not present

## 2017-06-11 DIAGNOSIS — R2689 Other abnormalities of gait and mobility: Secondary | ICD-10-CM | POA: Diagnosis not present

## 2017-06-11 DIAGNOSIS — R29713 NIHSS score 13: Secondary | ICD-10-CM | POA: Diagnosis present

## 2017-06-11 DIAGNOSIS — R531 Weakness: Secondary | ICD-10-CM | POA: Diagnosis present

## 2017-06-11 DIAGNOSIS — I110 Hypertensive heart disease with heart failure: Secondary | ICD-10-CM | POA: Diagnosis present

## 2017-06-11 DIAGNOSIS — K5520 Angiodysplasia of colon without hemorrhage: Secondary | ICD-10-CM | POA: Diagnosis present

## 2017-06-11 DIAGNOSIS — G14 Postpolio syndrome: Secondary | ICD-10-CM | POA: Diagnosis present

## 2017-06-11 DIAGNOSIS — J069 Acute upper respiratory infection, unspecified: Secondary | ICD-10-CM | POA: Diagnosis present

## 2017-06-11 DIAGNOSIS — E876 Hypokalemia: Secondary | ICD-10-CM | POA: Diagnosis present

## 2017-06-11 DIAGNOSIS — R4701 Aphasia: Secondary | ICD-10-CM | POA: Diagnosis present

## 2017-06-11 DIAGNOSIS — K219 Gastro-esophageal reflux disease without esophagitis: Secondary | ICD-10-CM | POA: Diagnosis present

## 2017-06-11 DIAGNOSIS — R402252 Coma scale, best verbal response, oriented, at arrival to emergency department: Secondary | ICD-10-CM | POA: Diagnosis present

## 2017-06-11 DIAGNOSIS — E559 Vitamin D deficiency, unspecified: Secondary | ICD-10-CM | POA: Diagnosis present

## 2017-06-11 DIAGNOSIS — G8250 Quadriplegia, unspecified: Secondary | ICD-10-CM | POA: Diagnosis present

## 2017-06-11 DIAGNOSIS — R402362 Coma scale, best motor response, obeys commands, at arrival to emergency department: Secondary | ICD-10-CM | POA: Diagnosis present

## 2017-06-11 DIAGNOSIS — I63512 Cerebral infarction due to unspecified occlusion or stenosis of left middle cerebral artery: Secondary | ICD-10-CM | POA: Diagnosis not present

## 2017-06-11 DIAGNOSIS — R29818 Other symptoms and signs involving the nervous system: Secondary | ICD-10-CM | POA: Diagnosis not present

## 2017-06-11 DIAGNOSIS — R402142 Coma scale, eyes open, spontaneous, at arrival to emergency department: Secondary | ICD-10-CM | POA: Diagnosis present

## 2017-06-11 DIAGNOSIS — Z993 Dependence on wheelchair: Secondary | ICD-10-CM | POA: Diagnosis not present

## 2017-06-11 DIAGNOSIS — E785 Hyperlipidemia, unspecified: Secondary | ICD-10-CM | POA: Diagnosis present

## 2017-06-11 DIAGNOSIS — I672 Cerebral atherosclerosis: Secondary | ICD-10-CM | POA: Diagnosis present

## 2017-06-11 DIAGNOSIS — R918 Other nonspecific abnormal finding of lung field: Secondary | ICD-10-CM | POA: Diagnosis not present

## 2017-06-11 DIAGNOSIS — Z8249 Family history of ischemic heart disease and other diseases of the circulatory system: Secondary | ICD-10-CM | POA: Diagnosis not present

## 2017-06-11 DIAGNOSIS — I5032 Chronic diastolic (congestive) heart failure: Secondary | ICD-10-CM | POA: Diagnosis present

## 2017-06-11 DIAGNOSIS — I10 Essential (primary) hypertension: Secondary | ICD-10-CM | POA: Diagnosis not present

## 2017-06-11 LAB — HEMOGLOBIN A1C
Hgb A1c MFr Bld: 4.7 % — ABNORMAL LOW (ref 4.8–5.6)
MEAN PLASMA GLUCOSE: 88 mg/dL

## 2017-06-11 MED ORDER — ONDANSETRON HCL 4 MG/2ML IJ SOLN
4.0000 mg | Freq: Three times a day (TID) | INTRAMUSCULAR | Status: DC | PRN
  Administered 2017-06-11: 4 mg via INTRAVENOUS
  Filled 2017-06-11: qty 2

## 2017-06-11 MED ORDER — ATORVASTATIN CALCIUM 10 MG PO TABS
10.0000 mg | ORAL_TABLET | Freq: Every day | ORAL | Status: DC
  Administered 2017-06-11: 10 mg via ORAL
  Filled 2017-06-11: qty 1

## 2017-06-11 NOTE — Progress Notes (Signed)
PROGRESS NOTE Attending MD note  Patient was seen, examined,treatment plan was discussed with the PA-S.  I have personally reviewed the clinical findings, lab, imaging studies and management of this patient in detail. I agree with the documentation, as recorded by the PA-S  Patient is 77 year old female with history of hypertension, polymyositis, chronic diastolic CHF, colon AVMs was admitted to the hospital on 5/28 for bilateral right greater than left upper extremity weakness.  MRI on admission showed left frontoparietal CVA.  Neurology was consulted   BP (!) 147/77 (BP Location: Right Arm)   Pulse 82   Temp (!) 97.5 F (36.4 C) (Oral)   Resp 16   Ht 5\' 3"  (1.6 m)   Wt 77.1 kg (170 lb)   SpO2 99%   BMI 30.11 kg/m  On Exam: Gen. exam: Awake, alert, not in any distress Chest: Good air entry bilaterally, no rhonchi or rales CVS: S1-S2 regular, no murmurs Abdomen: Soft, nontender and nondistended Neurology: RUE weakness, persistent.  Skin: No rash or lesions  Plan  Acute CVA -Neurology consulted, MRI with patchy acute nonhemorrhagic infarcts in the left frontoparietal/MCA territories, it also did show old right basal ganglia and old right thalamus and small infarcts.  LDL elevated, start statin, A1c pending -PT recommends CIR, discussed with admission coordinator, started insurance approval  Hypertension -Hold home medications, allow permissive hypertension, slowly normalized within 3 to 5 days  Pulmonary nodules -No history of smoking, will need repeat CT as an outpatient as per guidelines, defer to primary care  Polymyositis with significant deconditioning -Continue CellCept  Chronic diastolic CHF -2D echo with normal EF  URI -Patient has been having URI type symptoms with cough sputum production for the past 3 weeks.  She has been getting antibiotics for a few days and has just finished a steroid taper -Started on Augmentin on admission for concern for  aspiration, continue for 3 days as chest x-ray was negative     Rest as below  [name] M. [name], MD [facility] [phone]  If 7PM-7AM, please contact night-coverage [email or web address] Password [email or web address]    ELTON CATALANO XIP:[id number] DOB: 09/29/1940 DOA: 06/09/2017 PCP: [name], MD   LOS: 0 days   Brief Narrative / Interim history: [name] [name] is a 77 y.o. Female with a history of hypertension, polymyositis, diastolic CHF, AVM of the colon admitted to the hospital on 5/28 for bilateral weakness and numbness in upper extremities. Echocardiogram showed EF of 60-65% and mild LVH.   Assessment & Plan: Active Problems:   Essential hypertension   Polymyositis (HCC)   Chronic diastolic CHF (congestive heart failure) (HCC)   CVA (cerebral vascular accident) ([place])   Multiple pulmonary nodules   Neurologic deficit due to acute ischemic stroke (HCC)   Hypokalemia   Chronic diastolic congestive heart failure ([place])   AVM (arteriovenous malformation)  CVA -Pt improving since yesterday, remains weak in RUE but has regained sensation.  -Continue OT/PT -Continue Aspirin chewable 81mg  qd, Plavix 75mg  qd, Lovenox 40mg  qd -PT recommending inpatient rehab. Although patient wishes to return home, she understands CIR best option  Hypertension -BP reading elevated in hospital stay, with recent episode of CVA permissible hypertension allowed. Follow up with PCP recommended.   Pulmonary Nodules -Incidental finding on CTA neck, Non contrast CT 3-6 months recommended -Denies history of smoking  Polymyositis -Pt is wheelchair bound for past 8 months with chronic weakness of all 4 extremities -Continue Cellcept   Chronic diastolic CHF -Echocardiogram ordered, results stated below -Appears compensated,  continue to monitor, EF 60-65%  Severe Deconditioning -Pt previously wheelchair bound at home, needs assistance in ADLs such as moving from chair to commode -Suspect will need  inpatient rehab versus SNF  URI -Chest Xray shows no evidence of active pulmonary disease -Concern for aspiration, started on Augmentin on admission, continue    DVT prophylaxis: Lovenox 40mg  qd Code Status: Full code Family Communication: N/A Disposition Plan: CIR when improved if patient agrees  Consultants:   Neurology   Procedures:   2D echo: mild LVH with EF 60-65%. Normal wall motion. Trivial Aortic and Mitral valve regurgitation.    Antimicrobials:  Augmentin 875-125mg  BID started 5/29  Subjective: Pt reports feeling better than yesterday. She has regained more strength in the left arm and lower extremities.  She also has increased sensation in the right UE, but continues to have weakness. She reports having less difficulty feeding herself today as compared to yesterday. She reports talking to the therapist about CIR and that may be best for her post discharge.   Objective: Vitals:   06/10/17 2337 06/11/17 0436 06/11/17 0805 06/11/17 1119  BP: (!) 148/74 (!) 155/83 (!) 148/72 (!) 147/77  Pulse: 78 94 81 82  Resp: 18 18 20 16   Temp: 98.6 F (37 C) 99 F (37.2 C) 98.4 F (36.9 C) (!) 97.5 F (36.4 C)  TempSrc: Oral Oral Oral Oral  SpO2: 98% 99% 100% 99%  Weight:      Height:        Intake/Output Summary (Last 24 hours) at 06/11/2017 1249 Last data filed at 06/11/2017 1000 Gross per 24 hour  Intake 720 ml  Output 600 ml  Net 120 ml   Filed Weights   06/09/17 1450  Weight: 77.1 kg (170 lb)    Examination:  Constitutional: NAD Eyes: PERRL, lids and conjunctivae normal Respiratory: clear to auscultation bilaterally, no wheezing, no crackles. Normal respiratory effort. No accessory muscle use.  Cardiovascular: Regular rate and rhythm, no murmurs / rubs / gallops. No LE edema. 2+ pedal pulses. Abdomen: no tenderness. Bowel sounds positive.  Musculoskeletal: no clubbing / cyanosis. No joint deformity upper and lower extremities. No contractures. Normal  muscle tone.  Skin: no rashes, lesions, ulcers. No induration Neurologic: CN 2-12 grossly intact. Strength 2/5 in right UE and 4/5 in left UE. 5/5 in bilateral LE.  Psychiatric: Normal judgment and insight. Alert and oriented x 3. Normal mood.    Data Reviewed: I have independently reviewed following labs and imaging studies.  CBC: Recent Labs  Lab 06/09/17 1648 06/09/17 1656  WBC 7.4  --   NEUTROABS 4.2  --   HGB 9.6* 10.5*  HCT 31.2* 31.0*  MCV 83.9  --   PLT 296  --    Basic Metabolic Panel: Recent Labs  Lab 06/09/17 1648 06/09/17 1656  NA 140 142  K 3.6 3.6  CL 104 105  CO2 25  --   GLUCOSE 84 81  BUN 12 9  CREATININE 0.42* 0.50  CALCIUM 9.2  --   MG 1.6*  --    GFR: Estimated Creatinine Clearance: 57.9 mL/min (by C-G formula based on SCr of 0.5 mg/dL). Liver Function Tests: Recent Labs  Lab 06/09/17 1648  AST 24  ALT 18  ALKPHOS 49  BILITOT 0.5  PROT 6.2*  ALBUMIN 3.6   No results for input(s): LIPASE, AMYLASE in the last 168 hours. No results for input(s): AMMONIA in the last 168 hours. Coagulation Profile: Recent Labs  Lab 06/09/17 1648  INR 1.01   Cardiac Enzymes: Recent Labs  Lab 06/09/17 1648  CKTOTAL 581*   BNP (last 3 results) No results for input(s): PROBNP in the last 8760 hours. HbA1C: Recent Labs    06/10/17 0545  HGBA1C 4.7*   CBG: No results for input(s): GLUCAP in the last 168 hours. Lipid Profile: Recent Labs    06/10/17 0545  CHOL 299*  HDL 66  LDLCALC 164*  TRIG 347*  CHOLHDL 4.5   Thyroid Function Tests: No results for input(s): TSH, T4TOTAL, FREET4, T3FREE, THYROIDAB in the last 72 hours. Anemia Panel: No results for input(s): VITAMINB12, FOLATE, FERRITIN, TIBC, IRON, RETICCTPCT in the last 72 hours. Urine analysis:    Component Value Date/Time   COLORURINE STRAW (A) 06/09/2017 2135   APPEARANCEUR CLEAR 06/09/2017 2135   LABSPEC 1.023 06/09/2017 2135   PHURINE 5.0 06/09/2017 2135   GLUCOSEU NEGATIVE  06/09/2017 2135   HGBUR NEGATIVE 06/09/2017 2135   BILIRUBINUR NEGATIVE 06/09/2017 2135   KETONESUR NEGATIVE 06/09/2017 2135   PROTEINUR NEGATIVE 06/09/2017 2135   UROBILINOGEN 0.2 04/30/2013 1803   NITRITE NEGATIVE 06/09/2017 2135   LEUKOCYTESUR TRACE (A) 06/09/2017 2135   Sepsis Labs: Invalid input(s): PROCALCITONIN, LACTICIDVEN  No results found for this or any previous visit (from the past 240 hour(s)).    Radiology Studies: Ct Angio Head W Or Wo Contrast  Result Date: 06/09/2017 CLINICAL DATA:  Generalized numbness beginning in bilateral hands this morning. Follow-up infarct. History of hyperlipidemia. EXAM: CT ANGIOGRAPHY HEAD AND NECK TECHNIQUE: Multidetector CT imaging of the head and neck was performed using the standard protocol during bolus administration of intravenous contrast. Multiplanar CT image reconstructions and MIPs were obtained to evaluate the vascular anatomy. Carotid stenosis measurements (when applicable) are obtained utilizing NASCET criteria, using the distal internal carotid diameter as the denominator. CONTRAST:  74mL ISOVUE-370 IOPAMIDOL (ISOVUE-370) INJECTION 76% COMPARISON:  MRI of the head Jun 09, 2017 FINDINGS: CTA NECK FINDINGS: AORTIC ARCH: Normal appearance of the thoracic arch, normal branch pattern. Mild calcific atherosclerosis. The origins of the innominate, left Common carotid artery and subclavian artery are widely patent. RIGHT CAROTID SYSTEM: Common carotid artery is patent. Normal appearance of the carotid bifurcation without hemodynamically significant stenosis by NASCET criteria. Normal appearance of the internal carotid artery. LEFT CAROTID SYSTEM: Common carotid artery is patent. Normal appearance of the carotid bifurcation without hemodynamically significant stenosis by NASCET criteria. Normal appearance of the internal carotid artery. VERTEBRAL ARTERIES:Left vertebral artery is dominant. Normal appearance of the vertebral arteries, widely patent.  SKELETON: No acute osseous process though bone windows have not been submitted. Minimal grade 1 C4-5 anterolisthesis with multilevel advanced upper cervical facet arthropathy better demonstrated on today's MRI of the cervical spine. OTHER NECK: Soft tissues of the neck are nonacute though, not tailored for evaluation. RIGHT chest Port-A-Cath. UPPER CHEST: Multiple RIGHT upper lobe subsolid pulmonary nodules measuring to 5 mm. No superior mediastinal lymphadenopathy. CTA HEAD FINDINGS: ANTERIOR CIRCULATION: Patent cervical internal carotid arteries, petrous, cavernous and supra clinoid internal carotid arteries. Patent anterior and middle cerebral arteries. Multifocal severe stenosis bilateral A2 and A3 segments. Severe stenosis LEFT M2 inferior division origin. Multifocal severe stenosis bilateral mid to distal MCA including LEFT M3 segment. No large vessel occlusion, contrast extravasation or aneurysm. POSTERIOR CIRCULATION: Patent vertebral arteries, vertebrobasilar junction and basilar artery, as well as main branch vessels. Patent posterior cerebral arteries. Moderate stenosis RIGHT P2 segment. No large vessel occlusion, contrast extravasation or aneurysm. VENOUS SINUSES: Major dural venous sinuses are patent  though not tailored for evaluation on this angiographic examination. ANATOMIC VARIANTS: None. DELAYED PHASE: No abnormal intracranial enhancement. MIP images reviewed. IMPRESSION: CTA NECK: 1. No hemodynamically significant stenosis or acute vascular process in the neck. 2. **An incidental finding of potential clinical significance has been found. Multiple RIGHT upper lobe pulmonary nodules measuring less than 6 mm. Non-contrast chest CT at 3-6 months is recommended. If nodules persist and are stable at that time, consider additional non-contrast chest CT examinations at 2 and 4 years. This recommendation follows the consensus statement: Guidelines for Management of Incidental Pulmonary Nodules Detected on  CT Images: From the Fleischner Society 2017; Radiology 2017; 284:228-243.** CTA HEAD: 1. No emergent large vessel occlusion. 2. Multifocal severe stenosis anterior and middle cerebral artery's may be secondary to atherosclerosis or hypertensive vasculopathy. Aortic Atherosclerosis (ICD10-I70.0). Electronically Signed   By: Elon Alas M.D.   On: 06/09/2017 20:54   Ct Head Wo Contrast  Result Date: 06/09/2017 CLINICAL DATA:  Numbness and weakness. EXAM: CT HEAD WITHOUT CONTRAST TECHNIQUE: Contiguous axial images were obtained from the base of the skull through the vertex without intravenous contrast. COMPARISON:  10/22/2016 FINDINGS: Brain: More prominent hypodensity along the anterior limb of the right internal capsule may represent a subacute lacunar infarct. Older adjacent lacunar infarct of the basal ganglia appears stable. Stable advanced small vessel ischemic disease in the periventricular white matter. The brain demonstrates no evidence of hemorrhage, edema, mass effect, extra-axial fluid collection, hydrocephalus or mass lesion. Vascular: No hyperdense vessel or unexpected calcification. Skull: The skull shows osteopenia without evidence of fracture or bony lesions. Sinuses/Orbits: There are air-fluid levels in both visualized maxillary antra. Mucosal thickening also noted in bilateral ethmoid and sphenoid air cells. Other: None. IMPRESSION: 1. More prominent hypodensity along the anterior limb of the right internal capsule may represent a subacute lacunar infarct. Older lacunar infarcts and advanced small vessel disease appears stable. 2. New air-fluid levels in both maxillary antra as well as mucosal thickening in bilateral ethmoid and sphenoid air cells are suggestive of active sinusitis. Electronically Signed   By: Aletta Edouard M.D.   On: 06/09/2017 15:37   Ct Angio Neck W And/or Wo Contrast  Result Date: 06/09/2017 CLINICAL DATA:  Generalized numbness beginning in bilateral hands this  morning. Follow-up infarct. History of hyperlipidemia. EXAM: CT ANGIOGRAPHY HEAD AND NECK TECHNIQUE: Multidetector CT imaging of the head and neck was performed using the standard protocol during bolus administration of intravenous contrast. Multiplanar CT image reconstructions and MIPs were obtained to evaluate the vascular anatomy. Carotid stenosis measurements (when applicable) are obtained utilizing NASCET criteria, using the distal internal carotid diameter as the denominator. CONTRAST:  46mL ISOVUE-370 IOPAMIDOL (ISOVUE-370) INJECTION 76% COMPARISON:  MRI of the head Jun 09, 2017 FINDINGS: CTA NECK FINDINGS: AORTIC ARCH: Normal appearance of the thoracic arch, normal branch pattern. Mild calcific atherosclerosis. The origins of the innominate, left Common carotid artery and subclavian artery are widely patent. RIGHT CAROTID SYSTEM: Common carotid artery is patent. Normal appearance of the carotid bifurcation without hemodynamically significant stenosis by NASCET criteria. Normal appearance of the internal carotid artery. LEFT CAROTID SYSTEM: Common carotid artery is patent. Normal appearance of the carotid bifurcation without hemodynamically significant stenosis by NASCET criteria. Normal appearance of the internal carotid artery. VERTEBRAL ARTERIES:Left vertebral artery is dominant. Normal appearance of the vertebral arteries, widely patent. SKELETON: No acute osseous process though bone windows have not been submitted. Minimal grade 1 C4-5 anterolisthesis with multilevel advanced upper cervical facet arthropathy better demonstrated  on today's MRI of the cervical spine. OTHER NECK: Soft tissues of the neck are nonacute though, not tailored for evaluation. RIGHT chest Port-A-Cath. UPPER CHEST: Multiple RIGHT upper lobe subsolid pulmonary nodules measuring to 5 mm. No superior mediastinal lymphadenopathy. CTA HEAD FINDINGS: ANTERIOR CIRCULATION: Patent cervical internal carotid arteries, petrous, cavernous and  supra clinoid internal carotid arteries. Patent anterior and middle cerebral arteries. Multifocal severe stenosis bilateral A2 and A3 segments. Severe stenosis LEFT M2 inferior division origin. Multifocal severe stenosis bilateral mid to distal MCA including LEFT M3 segment. No large vessel occlusion, contrast extravasation or aneurysm. POSTERIOR CIRCULATION: Patent vertebral arteries, vertebrobasilar junction and basilar artery, as well as main branch vessels. Patent posterior cerebral arteries. Moderate stenosis RIGHT P2 segment. No large vessel occlusion, contrast extravasation or aneurysm. VENOUS SINUSES: Major dural venous sinuses are patent though not tailored for evaluation on this angiographic examination. ANATOMIC VARIANTS: None. DELAYED PHASE: No abnormal intracranial enhancement. MIP images reviewed. IMPRESSION: CTA NECK: 1. No hemodynamically significant stenosis or acute vascular process in the neck. 2. **An incidental finding of potential clinical significance has been found. Multiple RIGHT upper lobe pulmonary nodules measuring less than 6 mm. Non-contrast chest CT at 3-6 months is recommended. If nodules persist and are stable at that time, consider additional non-contrast chest CT examinations at 2 and 4 years. This recommendation follows the consensus statement: Guidelines for Management of Incidental Pulmonary Nodules Detected on CT Images: From the Fleischner Society 2017; Radiology 2017; 284:228-243.** CTA HEAD: 1. No emergent large vessel occlusion. 2. Multifocal severe stenosis anterior and middle cerebral artery's may be secondary to atherosclerosis or hypertensive vasculopathy. Aortic Atherosclerosis (ICD10-I70.0). Electronically Signed   By: Elon Alas M.D.   On: 06/09/2017 20:54   Mr Brain Wo Contrast  Result Date: 06/09/2017 CLINICAL DATA:  Generalized numbness beginning in upper extremity since this morning, weakness. Recent viral illness, on prednisone. EXAM: MRI HEAD WITHOUT  CONTRAST MRI CERVICAL SPINE WITHOUT CONTRAST TECHNIQUE: Multiplanar, multiecho pulse sequences of the brain and surrounding structures, and cervical spine, to include the craniocervical junction and cervicothoracic junction, were obtained without intravenous contrast. COMPARISON:  CT HEAD Jun 09, 2017 and MRI of the cervical spine March 23, 2017 and MRI of the head April 22, 2012 FINDINGS: MRI HEAD FINDINGS INTRACRANIAL CONTENTS: Patchy reduced diffusion LEFT frontoparietal lobes with low ADC values. Subcentimeter reduced diffusion LEFT thalamus with normalized ADC values. No susceptibility artifact to suggest hemorrhage. Confluent supratentorial white matter FLAIR T2 hyperintensities. Patchy pontine T2 hyperintensities. Old RIGHT basal ganglia and RIGHT thalamus cystic small infarcts. Hazy T2 hyperintensities bilateral basal ganglia and bilateral thalami seen with chronic small vessel ischemic changes. No parenchymal brain volume loss for age. No midline shift, mass effect or masses. VASCULAR: Normal major intracranial vascular flow voids present at skull base. SKULL AND UPPER CERVICAL SPINE: No abnormal sellar expansion. No suspicious calvarial bone marrow signal. Craniocervical junction maintained. SINUSES/ORBITS: The mastoid air-cells and included paranasal sinuses are well-aerated.The included ocular globes and orbital contents are non-suspicious. OTHER: None. MRI CERVICAL SPINE FINDINGS ALIGNMENT: Straightened cervical lordosis. Minimal grade 1 C4-5 anterolisthesis. VERTEBRAE/DISCS: Vertebral bodies are intact. Intervertebral disc and demonstrate normal morphology with mild desiccation. No abnormal or acute bone marrow signal. CORD:Cervical spinal cord is normal morphology and signal characteristics from the cervicomedullary junction to level of T2-3, the most caudal well visualized level. POSTERIOR FOSSA, VERTEBRAL ARTERIES, PARASPINAL TISSUES: No MR findings of ligamentous injury. Vertebral artery flow voids  present. Included posterior fossa and paraspinal soft tissues are normal.  DISC LEVELS: C2-3: No disc bulge. Uncovertebral hypertrophy and severe facet arthropathy without canal stenosis. Moderate to severe RIGHT neural foraminal narrowing. C3-4: Uncovertebral hypertrophy. Moderate to severe bilateral facet arthropathy. No canal stenosis. Moderate to severe LEFT neural foraminal narrowing. C4-5: Anterolisthesis. Severe RIGHT and moderate LEFT facet arthropathy with trace RIGHT facet effusion. No canal stenosis or neural foraminal narrowing. C5-6, C6-7: No disc bulge, canal stenosis nor neural foraminal narrowing. C7-T1: No disc bulge. Mild facet arthropathy. No canal stenosis. Mild LEFT neural foraminal narrowing. IMPRESSION: MRI HEAD: 1. Patchy acute nonhemorrhagic infarcts LEFT frontoparietal/MCA territories. Subacute subcentimeter LEFT thalamus infarct. 2. Old RIGHT basal ganglia and old RIGHT thalamus small infarcts. Severe chronic small vessel ischemic changes. MRI CERVICAL SPINE: 1. Progressed degenerative change of the cervical spine, grade 1 C4-5 anterolisthesis. 2. No canal stenosis. Moderate to severe C2-3 and C3-4 neural foraminal narrowing. Electronically Signed   By: Elon Alas M.D.   On: 06/09/2017 18:59   Mr Cervical Spine Wo Contrast  Result Date: 06/09/2017 CLINICAL DATA:  Generalized numbness beginning in upper extremity since this morning, weakness. Recent viral illness, on prednisone. EXAM: MRI HEAD WITHOUT CONTRAST MRI CERVICAL SPINE WITHOUT CONTRAST TECHNIQUE: Multiplanar, multiecho pulse sequences of the brain and surrounding structures, and cervical spine, to include the craniocervical junction and cervicothoracic junction, were obtained without intravenous contrast. COMPARISON:  CT HEAD Jun 09, 2017 and MRI of the cervical spine March 23, 2017 and MRI of the head April 22, 2012 FINDINGS: MRI HEAD FINDINGS INTRACRANIAL CONTENTS: Patchy reduced diffusion LEFT frontoparietal lobes  with low ADC values. Subcentimeter reduced diffusion LEFT thalamus with normalized ADC values. No susceptibility artifact to suggest hemorrhage. Confluent supratentorial white matter FLAIR T2 hyperintensities. Patchy pontine T2 hyperintensities. Old RIGHT basal ganglia and RIGHT thalamus cystic small infarcts. Hazy T2 hyperintensities bilateral basal ganglia and bilateral thalami seen with chronic small vessel ischemic changes. No parenchymal brain volume loss for age. No midline shift, mass effect or masses. VASCULAR: Normal major intracranial vascular flow voids present at skull base. SKULL AND UPPER CERVICAL SPINE: No abnormal sellar expansion. No suspicious calvarial bone marrow signal. Craniocervical junction maintained. SINUSES/ORBITS: The mastoid air-cells and included paranasal sinuses are well-aerated.The included ocular globes and orbital contents are non-suspicious. OTHER: None. MRI CERVICAL SPINE FINDINGS ALIGNMENT: Straightened cervical lordosis. Minimal grade 1 C4-5 anterolisthesis. VERTEBRAE/DISCS: Vertebral bodies are intact. Intervertebral disc and demonstrate normal morphology with mild desiccation. No abnormal or acute bone marrow signal. CORD:Cervical spinal cord is normal morphology and signal characteristics from the cervicomedullary junction to level of T2-3, the most caudal well visualized level. POSTERIOR FOSSA, VERTEBRAL ARTERIES, PARASPINAL TISSUES: No MR findings of ligamentous injury. Vertebral artery flow voids present. Included posterior fossa and paraspinal soft tissues are normal. DISC LEVELS: C2-3: No disc bulge. Uncovertebral hypertrophy and severe facet arthropathy without canal stenosis. Moderate to severe RIGHT neural foraminal narrowing. C3-4: Uncovertebral hypertrophy. Moderate to severe bilateral facet arthropathy. No canal stenosis. Moderate to severe LEFT neural foraminal narrowing. C4-5: Anterolisthesis. Severe RIGHT and moderate LEFT facet arthropathy with trace RIGHT  facet effusion. No canal stenosis or neural foraminal narrowing. C5-6, C6-7: No disc bulge, canal stenosis nor neural foraminal narrowing. C7-T1: No disc bulge. Mild facet arthropathy. No canal stenosis. Mild LEFT neural foraminal narrowing. IMPRESSION: MRI HEAD: 1. Patchy acute nonhemorrhagic infarcts LEFT frontoparietal/MCA territories. Subacute subcentimeter LEFT thalamus infarct. 2. Old RIGHT basal ganglia and old RIGHT thalamus small infarcts. Severe chronic small vessel ischemic changes. MRI CERVICAL SPINE: 1. Progressed degenerative change of the cervical  spine, grade 1 C4-5 anterolisthesis. 2. No canal stenosis. Moderate to severe C2-3 and C3-4 neural foraminal narrowing. Electronically Signed   By: Elon Alas M.D.   On: 06/09/2017 18:59   Dg Chest Port 1 View  Result Date: 06/09/2017 CLINICAL DATA:  Weakness EXAM: PORTABLE CHEST 1 VIEW COMPARISON:  01/02/2017 FINDINGS: Porta catheter on the right with tip at the upper cavoatrial junction. Borderline heart size. Chronic right diaphragm elevation with scarring at the right base. There is no edema, consolidation, effusion, or pneumothorax. No acute osseous finding. Distal right clavicle resection. IMPRESSION: Stable from prior.  No evidence of active disease. Electronically Signed   By: Monte Fantasia M.D.   On: 06/09/2017 16:13   Dg Swallowing Func-speech Pathology  Result Date: 06/10/2017 Objective Swallowing Evaluation: Type of Study: MBS-Modified Barium Swallow Study  Patient Details Name: [name] [name] MRN: [id number] Date of Birth: 08/07/1940 Today's Date: 06/10/2017 Time: SLP Start Time (ACUTE ONLY): 0825 -SLP Stop Time (ACUTE ONLY): 0859 SLP Time Calculation (min) (ACUTE ONLY): 34 min Past Medical History: Past Medical History: Diagnosis Date . Allergic rhinitis, cause unspecified  . Benign neoplasm of colon  . Esophageal reflux  . Excessive daytime sleepiness  . 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 GI hemorrhage 2011  recurrent .  Hyperlipidemia  . Osteoporosis  . Polymyositis ([place])  . Unspecified essential hypertension  . Vitamin D deficiency  Past Surgical History: Past Surgical History: Procedure Laterality Date . ABDOMINAL HYSTERECTOMY    partial . bilateral  rotator cuff surgery   . COLONOSCOPY  01/09/2011  others also . COLONOSCOPY  05/29/2011  Procedure: COLONOSCOPY;  Surgeon: Gatha Mayer, MD;  Location: [facility];  Service: Endoscopy;  Laterality: N/A;  Greggory Brandy Carlean Purl . COLONOSCOPY WITH PROPOFOL N/A 02/16/2014  Procedure: COLONOSCOPY WITH PROPOFOL;  Surgeon: Milus Banister, MD;  Location: [facility];  Service: Endoscopy;  Laterality: N/A; . ESOPHAGOGASTRODUODENOSCOPY  12/08/2010  others also . ESOPHAGOGASTRODUODENOSCOPY (EGD) WITH PROPOFOL N/A 02/16/2014  Procedure: ESOPHAGOGASTRODUODENOSCOPY (EGD) WITH PROPOFOL;  Surgeon: Milus Banister, MD;  Location: [facility];  Service: Endoscopy;  Laterality: N/A; . EUS   . HOT HEMOSTASIS  05/29/2011  Procedure: HOT HEMOSTASIS (ARGON PLASMA COAGULATION/BICAP);  Surgeon: Gatha Mayer, MD;  Location: [facility];  Service: Endoscopy;  Laterality: N/A; . LUMBAR LAMINECTOMY   . 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  Pt initially passed RNSSS but then was noted to have foaming secretions in mouth and drooling.  Pt is wheelchair bound and has chronic upper body weakness.  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  SLP advised to order MBS in lieu of clinical evaluation.   Subjective: pt awake in chair Assessment / Plan / Recommendation CHL IP CLINICAL IMPRESSIONS 06/10/2017 Clinical Impression Pt presents with overall functional oropharyngeal swallow.  NO aspiration/consistent laryngeal penetration of consistencies tested.  Pt did have a single episode of laryngeal penetration to vocal folds with thin x1 clearing independently.  Of note, pt  did cough during MBS but barium was not visualized in larynx.  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   SLP Visit Diagnosis Dysphagia, pharyngoesophageal phase (R13.14) Attention and concentration deficit following -- Frontal lobe and executive function deficit following -- Impact on safety and function Moderate aspiration risk   CHL IP TREATMENT  RECOMMENDATION 06/10/2017 Treatment Recommendations Therapy as outlined in treatment plan below   Prognosis 06/10/2017 Prognosis for Safe Diet Advancement Guarded Barriers to Reach Goals -- Barriers/Prognosis Comment -- CHL IP DIET RECOMMENDATION 06/10/2017 SLP Diet Recommendations Dysphagia 3 (Mech soft) solids;Thin liquid Liquid Administration via Cup;Straw Medication Administration Whole meds with puree Compensations Slow rate;Small sips/bites Postural Changes Seated upright at 90 degrees;Remain semi-upright after after feeds/meals (Comment)   CHL IP OTHER RECOMMENDATIONS 06/10/2017 Recommended Consults -- Oral Care Recommendations Oral care BID Other Recommendations --   CHL IP FOLLOW UP RECOMMENDATIONS 06/10/2017 Follow up Recommendations None   CHL IP FREQUENCY AND DURATION 06/10/2017 Speech Therapy Frequency (ACUTE ONLY) min 1 x/week Treatment Duration 1 week      CHL IP ORAL PHASE 06/10/2017 Oral Phase WFL Oral - Pudding Teaspoon -- Oral - Pudding Cup -- Oral - Honey Teaspoon -- Oral - Honey Cup -- Oral - Nectar Teaspoon WFL Oral - Nectar Cup -- Oral - Nectar Straw WFL Oral - Thin Teaspoon WFL Oral - Thin Cup WFL Oral - Thin Straw WFL Oral - Puree WFL Oral - Mech Soft -- Oral - Regular WFL Oral - Multi-Consistency -- Oral - Pill WFL Oral Phase - Comment --  CHL IP PHARYNGEAL PHASE 06/10/2017 Pharyngeal Phase Impaired Pharyngeal- Pudding Teaspoon -- Pharyngeal -- Pharyngeal- Pudding Cup -- Pharyngeal -- Pharyngeal- Honey Teaspoon -- Pharyngeal -- Pharyngeal- Honey Cup -- Pharyngeal -- Pharyngeal-  Nectar Teaspoon Delayed swallow initiation-vallecula Pharyngeal -- Pharyngeal- Nectar Cup -- Pharyngeal -- Pharyngeal- Nectar Straw Delayed swallow initiation-vallecula Pharyngeal -- Pharyngeal- Thin Teaspoon Delayed swallow initiation-vallecula;WFL Pharyngeal -- Pharyngeal- Thin Cup WFL;Delayed swallow initiation-vallecula Pharyngeal -- Pharyngeal- Thin Straw Penetration/Aspiration before swallow;Delayed swallow initiation-vallecula;Delayed swallow initiation-pyriform sinuses Pharyngeal Material enters airway, CONTACTS cords and then ejected out Pharyngeal- Puree WFL Pharyngeal -- Pharyngeal- Mechanical Soft -- Pharyngeal -- Pharyngeal- Regular WFL Pharyngeal -- Pharyngeal- Multi-consistency -- Pharyngeal -- Pharyngeal- Pill WFL Pharyngeal -- Pharyngeal Comment trace silent laryngeal penetration of thin via straw to vocal cords x1 of 5 boluses   CHL IP CERVICAL ESOPHAGEAL PHASE 06/10/2017 Cervical Esophageal Phase Impaired Pudding Teaspoon -- Pudding Cup -- Honey Teaspoon -- Honey Cup -- Nectar Teaspoon Reduced cricopharyngeal relaxation;Prominent cricopharyngeal segment Nectar Cup -- Nectar Straw Reduced cricopharyngeal relaxation;Prominent cricopharyngeal segment Thin Teaspoon Reduced cricopharyngeal relaxation;Prominent cricopharyngeal segment Thin Cup Reduced cricopharyngeal relaxation;Prominent cricopharyngeal segment Thin Straw Reduced cricopharyngeal relaxation;Prominent cricopharyngeal segment Puree Reduced cricopharyngeal relaxation;Prominent cricopharyngeal segment Mechanical Soft -- Regular Reduced cricopharyngeal relaxation;Prominent cricopharyngeal segment Multi-consistency -- Pill Prominent cricopharyngeal segment;Reduced cricopharyngeal relaxation Cervical Esophageal Comment appearance of possibly developing prominent cricopharyngeus that may be developing into Zenker's diverticulum,  No flowsheet data found. Macario Golds 06/10/2017, 9:35 AM    Luanna Salk, MS [facility] SLP [phone]              Scheduled Meds: . amoxicillin-clavulanate  1 tablet Oral Q12H  . aspirin  81 mg Oral Daily  . calcium carbonate  2 tablet Oral Daily  . clopidogrel  75 mg Oral Daily  . enoxaparin (LOVENOX) injection  40 mg Subcutaneous Q24H  . febuxostat  40 mg Oral Daily  . feeding supplement (ENSURE ENLIVE)  237 mL Oral BID BM  . heparin lock flush  500 Units Intracatheter Q30 days  . mycophenolate  500 mg Oral BID  . pantoprazole  40 mg Oral Daily   Carita Pian, PA-S   Marzetta Board, MD, PhD [facility] Pager [phone]  If 7PM-7AM, please contact night-coverage [email or web address] Password [facility] 06/11/2017, 12:49 PM   @  [email or web address]

## 2017-06-11 NOTE — Progress Notes (Signed)
Inpatient Rehabilitation-Admissions Coordinator    Met with patient and daughter at the bedside to discuss team's recommendation for inpatient rehabilitation. Shared booklets, expectations while in CIR, expected length of stay, and anticipated functional level at DC. AC clarified PLOF and what can be provided at home after CIR DC. Daughter reports she can be there as often as she needs and can assist some physically (min A-as she has her own medical needs). Spoke with son over the phone who is the main caregiver who said he could provide the physical assistance needed after DC (he was performing up to Max transfers per his report). Family and pt wanting CIR but awaiting benefits check before final decision. AC will also meet with the son and pt later today per his request. [facility] plans to open insurance case today. Plan to follow for timing of medical readiness, insurance authorization, and IP Rehab bed availability. Call if questions.   Jhonnie Garner, OTR/L  Rehab Admissions Coordinator  [phone] 06/11/2017 11:38 AM

## 2017-06-11 NOTE — Progress Notes (Addendum)
Inpatient Rehabilitation-Admissions Coordinator   Spoke with son, daughter, and patient at length regarding CIR and shared benefits letter. All in agreement that CIR is best choice for pt at this time. AC is still awaiting insurance authorization and is hopeful for admit to CIR tomorrow.  Call if questions.   Jhonnie Garner, OTR/L  Rehab Admissions Coordinator  [phone] 06/11/2017 5:28 PM

## 2017-06-11 NOTE — Progress Notes (Signed)
Occupational Therapy Treatment Patient Details Name: [name] [name] MRN: [id number] DOB: 1940-10-23 Today's Date: 06/11/2017    History of present illness 77 y.o. female past medical history of hypertension, polymyositis, diastolic CHF, colonic AVM, was brought into the [place] emergency room on 06/09/2017 with complaints of generalized numbness and weakness that started around 5 AM on the morning of 06/09/2017.  Imaging revealed mutilple scattered left MCA territory strokes   OT comments  Pt making limited progress towards goals this session. Fatigued and sleepy. Pt able to perform bed level grooming this session prior to sitting EOB for additional functional dynamic balance activities. Pt declined OOB at this time. OT will continue to follow acutely and CIR remains appropriate dc plan.   Follow Up Recommendations  CIR;Supervision/Assistance - 24 hour    Equipment Recommendations  Other (comment)(defer to next venue)    Recommendations for Other Services Rehab consult    Precautions / Restrictions Precautions Precautions: Shoulder;Fall Type of Shoulder Precautions: L shoulder with subluxation Precaution Comments: good carryover of precautions for RUE from earlier PT session Restrictions Weight Bearing Restrictions: No       Mobility Bed Mobility Overal bed mobility: Needs Assistance Bed Mobility: Rolling Rolling: Min assist;Max assist         General bed mobility comments: min assist to roll to right using bed rail, max assist to roll to L side, bed pad used to complete turn; vc for sequencing and using BLE bent at knees for technique  Transfers                 General transfer comment: Pt declined transfer at this time    Balance Overall balance assessment: Needs assistance Sitting-balance support: Feet supported Sitting balance-Leahy Scale: Fair Sitting balance - Comments: able to sit EOB unsupported but demonstrates a left bias at times Postural control:  Left lateral lean(varies)                                 ADL either performed or assessed with clinical judgement   ADL Overall ADL's : Needs assistance/impaired     Grooming: Wash/dry face;Maximal assistance;Bed level Grooming Details (indicate cue type and reason): [facility]                                     Vision       Perception     Praxis      Cognition Arousal/Alertness: Awake/alert Behavior During Therapy: WFL for tasks assessed/performed Overall Cognitive Status: Within Functional Limits for tasks assessed                                          Exercises     Shoulder Instructions       General Comments      Pertinent Vitals/ Pain       Pain Assessment: No/denies pain  Home Living                                          Prior Functioning/Environment              Frequency  Min 2X/week  Progress Toward Goals  OT Goals(current goals can now be found in the care plan section)  Progress towards OT goals: Progressing toward goals(limited)  Acute Rehab OT Goals Patient Stated Goal: to get back to how she was OT Goal Formulation: With patient Time For Goal Achievement: 06/24/17 Potential to Achieve Goals: Good  Plan Discharge plan remains appropriate;Frequency remains appropriate    Co-evaluation                 AM-PAC PT "6 Clicks" Daily Activity     Outcome Measure   Help from another person eating meals?: Total Help from another person taking care of personal grooming?: Total Help from another person toileting, which includes using toliet, bedpan, or urinal?: Total Help from another person bathing (including washing, rinsing, drying)?: Total Help from another person to put on and taking off regular upper body clothing?: Total Help from another person to put on and taking off regular lower body clothing?: Total 6 Click Score: 6    End of Session    OT  Visit Diagnosis: Other symptoms and signs involving the nervous system (R29.898);Hemiplegia and hemiparesis Hemiplegia - Right/Left: Right Hemiplegia - dominant/non-dominant: Dominant Hemiplegia - caused by: Unspecified   Activity Tolerance Patient limited by fatigue   Patient Left in bed;with call bell/phone within reach;with bed alarm set;with family/visitor present;with SCD's reapplied   Nurse Communication Mobility status        Time: 0037-0488 OT Time Calculation (min): 24 min  Charges: OT General Charges $OT Visit: 1 Visit OT Treatments $Self Care/Home Management : 8-22 mins $Therapeutic Activity: 8-22 mins  Hulda Humphrey OTR/L [place] 06/11/2017, 6:06 PM

## 2017-06-11 NOTE — Evaluation (Signed)
Speech Language Pathology Evaluation Patient Details Name: [name] [name] MRN: [id number] DOB: 10-12-1940 Today's Date: 06/11/2017 Time: 7408-1448 SLP Time Calculation (min) (ACUTE ONLY): 25 min  Problem List:  Patient Active Problem List   Diagnosis Date Noted  . Neurologic deficit due to acute ischemic stroke ([place])   . Hypokalemia   . Chronic diastolic congestive heart failure ([place])   . AVM (arteriovenous malformation)   . CVA (cerebral vascular accident) ([place]) 06/09/2017  . Multiple pulmonary nodules 06/09/2017  . Hypothermia 10/22/2016  . Chronic diastolic CHF (congestive heart failure) ([place]) 10/22/2016  . Abdominal pain, unspecified site 05/17/2013  . Nausea alone 05/17/2013  . 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 Abdominal pain, left lower quadrant 05/28/2011  . Cecal ulcer with hemorrhage 01/10/2011  . Acute blood loss anemia 12/06/2010  . Hematochezia 12/05/2010  . Anemia 12/05/2010  . Adrenal insufficiency ([place]) 12/05/2010  . Osteoporosis 12/05/2010  . LUNG INVOLVEMENT OTHER DISEASES CLASSIFIED ELSW 08/13/2009  . OTHER SPECIFIED DISORDER OF STOMACH AND DUODENUM 07/06/2008  . Essential hypertension 04/26/2007  . ALLERGIC RHINITIS 04/26/2007  . GASTROESOPHAGEAL REFLUX DISEASE 04/26/2007  . Polymyositis ([place]) 04/26/2007  . COLONIC POLYPS, ADENOMATOUS 02/06/2005   Past Medical History:  Past Medical History:  Diagnosis Date  . Allergic rhinitis, cause unspecified   . Benign neoplasm of colon   . Esophageal reflux   . Excessive daytime sleepiness   . 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 GI hemorrhage 2011   recurrent  . Hyperlipidemia   . Osteoporosis   . Polymyositis ([place])   . Unspecified essential hypertension   . Vitamin D deficiency    Past Surgical History:  Past Surgical History:  Procedure Laterality Date  . ABDOMINAL HYSTERECTOMY     partial  . bilateral  rotator cuff surgery    . COLONOSCOPY  01/09/2011    others also  . COLONOSCOPY  05/29/2011   Procedure: COLONOSCOPY;  Surgeon: Gatha Mayer, MD;  Location: [facility];  Service: Endoscopy;  Laterality: N/A;  Greggory Brandy Carlean Purl  . COLONOSCOPY WITH PROPOFOL N/A 02/16/2014   Procedure: COLONOSCOPY WITH PROPOFOL;  Surgeon: Milus Banister, MD;  Location: [facility];  Service: Endoscopy;  Laterality: N/A;  . ESOPHAGOGASTRODUODENOSCOPY  12/08/2010   others also  . ESOPHAGOGASTRODUODENOSCOPY (EGD) WITH PROPOFOL N/A 02/16/2014   Procedure: ESOPHAGOGASTRODUODENOSCOPY (EGD) WITH PROPOFOL;  Surgeon: Milus Banister, MD;  Location: [facility];  Service: Endoscopy;  Laterality: N/A;  . EUS    . HOT HEMOSTASIS  05/29/2011   Procedure: HOT HEMOSTASIS (ARGON PLASMA COAGULATION/BICAP);  Surgeon: Gatha Mayer, MD;  Location: [facility];  Service: Endoscopy;  Laterality: N/A;  . LUMBAR LAMINECTOMY    . TONSILLECTOMY  age 64   HPI:  77 yo female transferred from 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  Pt initially passed RNSSS but then was noted to have foaming secretions in mouth and drooling.  Pt is wheelchair bound and has chronic upper body weakness.  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       Assessment / Plan / Recommendation Clinical Impression  Pt presents with fluent expression, excellent comprehension, no naming deficits.  Speech is fully intelligible, with subtle dysarthria.  Pt is a good historian, with good memory for recent events and normal insight.  No SLP needs identified - will follow briefly to address swallow goals only.  SLP Assessment  SLP Recommendation/Assessment: Patient does not need any further Speech Lanaguage Pathology Services    Follow Up Recommendations  None    Frequency and Duration           SLP Evaluation Cognition  Overall Cognitive Status: Within Functional Limits for tasks assessed Arousal/Alertness:  Awake/alert Orientation Level: Oriented X4 Attention: Selective Selective Attention: Appears intact Memory: Appears intact Awareness: Appears intact Problem Solving: Appears intact       Comprehension  Auditory Comprehension Overall Auditory Comprehension: Appears within functional limits for tasks assessed Visual Recognition/Discrimination Discrimination: Within Function Limits Reading Comprehension Reading Status: Within funtional limits    Expression Expression Primary Mode of Expression: Verbal Verbal Expression Overall Verbal Expression: Appears within functional limits for tasks assessed Written Expression Dominant Hand: Right Written Expression: Not tested   Oral / Motor  Motor Speech Overall Motor Speech: Appears within functional limits for tasks assessed   GO                    [name] [name] 06/11/2017, 9:56 AM

## 2017-06-11 NOTE — Progress Notes (Signed)
Stroke Team Progress Note     SUBJECTIVE Patient was alone in the room she states that patient difficulties have improved but weakness persist. OBJECTIVE Most recent Vital Signs: Temp: 98.5 F (36.9 C) (05/30 1525) Temp Source: Oral (05/30 1525) BP: 123/68 (05/30 1525) Pulse Rate: 83 (05/30 1525) Respiratory Rate: 20 O2 Saturdation: 99%  CBG (last 3)  No results for input(s): GLUCAP in the last 72 hours.     Studies:  CT head More prominent hypodensity along the anterior limb of the right internal capsule may represent a subacute lacunar infarct. Older lacunar infarcts and advanced small vessel disease appears stable MRI brain Patchy reduced diffusion LEFT frontoparietal lobes with low ADC values. Subcentimeter reduced diffusion LEFT thalamus with normalized ADC values CTAs :  No hemodynamically significant stenosis in the neck . Multifocal severe stenosis of the anterior and middle cerebral arteries due to atherosclerosis intracranially. ECHO Left ventricle: The cavity size was normal. Wall thickness was   increased in a pattern of mild LVH. Systolic function was normal.   The estimated ejection fraction was in the range of 60% to 65%.   Wall motion was normal; there were no regional wall motion   abnormalitiesLDL164 mg percent HbA1c pending  Physical Exam:    Elderly African-American lady who looks frail and malnourished. . Afebrile. Head is nontraumatic. Neck is supple without bruit.    Cardiac exam no murmur or gallop. Lungs are clear to auscultation. Distal pulses are well felt. Neurological Exam ; She is awake and alert but disoriented. She follows simple one and some two-step commands. Speech is slightly dysarthric  Extraocular moments are full range without nystagmus. She blinks to threat bilaterally. Fundi were not visualized. Mild right lower facial weakness. Tongue midline. Motor system exam reveals bilateral proximal weakness in both shoulders as well as legs. She  has weakness of right grip and intrinsic hand muscles. She has good grip on the left. She has only grade 2/5 strength in both upper and lower extremities but right side is weaker than left. Deep tendon reflexes are depressed. Plantars are downgoing. Sensation appears intact. Unable to perform finger-to-nose and needle coordination. Gait not tested. ASSESSMENT [name] is a 77 y.o. female with  Long-standing history of polymyositis and significant quadriparesis was bedridden and has vascular risk factors of hypertension, hyperlipidemia and intracranial atherosclerosis who has presented with multifocal left hemispheric infarct  Hospital day # 0  TREATMENT/PLAN  Continue dual antiplatelet therapy of aspirin and Plavix for 3 months due to severe intracranial atherosclerosis.  . Aggressive risk factor modification. Add statin for elevated lipids despite her muscle weakness from polymyositis to lower stroke risk. No family available at the bedside for discussion. Discussed with Dr. Gloris Ham. Continues statin in the short-term but consider switching to the new PCS 9 inhibitor injections for treatment of her hyperlipidemia due to fear of statin myopathy worsening her weakness from polymyositis I spent  25  minutes in total face-to-face time with the patient, more than 50% of which was spent in counseling and coordination of care, reviewing test results, reviewing medication and discussing or reviewing the diagnosis of strokes and polymyositis   , the prognosis and treatment options.  Stroke team will sign off. Follow-up as an outpatient in stroke clinic and with her primary neurologist for her polymyositis. Antony Contras, MD Medical Director [place] Pager: [phone] 06/11/2017 5:04 PM

## 2017-06-11 NOTE — PMR Pre-admission (Signed)
PMR Admission Coordinator Pre-Admission Assessment  Patient: [name] [name] is an 77 y.o., female MRN: [id number] DOB: 11-18-1940 Height: 5\' 3"  (160 cm) Weight: 77.1 kg (170 lb)              Insurance Information HMO: Yes    PPO:      PCP:      IPA:      80/20:      OTHER: AARP MCR Complete Plan 1 PRIMARY: UHC Medicare      Policy#: [id number]      Subscriber: Patient CM Name: [name] [name]      Phone#: [phone]     Fax#: [phone]  Update CM on day 7 (admit date 5/31: fax clinical updates on 6/7) Pre-Cert#: [id number]      Employer:  Benefits:  Phone #: NA     Name: Checked through [email or web address] Eff. Date: 04/13/2017     Deduct: $0      Out of Pocket Max: $4,400      Life Max: NA CIR: $345/day for days 1-5; $0/day for days 6+      SNF: $0/day for days 1-20; $160/day for days 21-48; $0/day for days 49-100. 100 day SNF limit Outpatient: as necessary     Co-Pay: $40/visit Home Health: as necessary (100% covered)     Co-Pay: $0 DME: 80% covered     Co-Pay: 20% Providers: In network  Medicaid Application Date:       Case Manager:  Disability Application Date:       Case Worker:   Emergency [place]    Name Relation Home Work Mobile   Briceno,Sandrekia Daughter [phone]       Current Medical History  Patient Admitting Diagnosis: Infarct Left Frontoparietal MCA History of Present Illness: [name] [name] is a 77 year old right-handed female with history of hypertension, polymyositis, diastolic congestive heart failure, colonic AVM.  Presented to [facility] emergency room 06/09/2017 with complaints of generalized numbness and weakness.  At baseline patient is essentially wheelchair-bound and has chronic bilateral upper extremity weakness attributed to polymyositis. CT of head reviewed, showing anterior capsule infarction.  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  Subacute subcentimeter left thalamus  infarction as well as old right basal ganglia and old right thalamus and small infarcts.  MRI cervical spine negative.  Patient did not receive TPA.  CT angiogram of head and neck with no emergent large vessel occlusion.  Incidental findings of multiple right upper lobe pulmonary nodules and recommendations of CT of chest 3 to 6 months.  Echocardiogram with ejection fraction of 06% grade 1 diastolic dysfunction.  Currently maintained on aspirin and Plavix for CVA prophylaxis x3 months.  Subcutaneous Lovenox for DVT prophylaxis.  Patient developed upper respiratory infection.  Chest x-ray showed no evidence of active pulmonary disease.  Started on Augmentin 06/10/2017.  Tolerating a regular diet.  Physical and occupational therapy evaluations completed with recommendations of physical medicine rehab consult.  Patient is to be admitted for a comprehensive rehab program on 06/12/17.  Total: 10    Past Medical History  Past Medical History:  Diagnosis Date  . Allergic rhinitis, cause unspecified   . Benign neoplasm of colon   . Esophageal reflux   . Excessive daytime sleepiness   . 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 GI hemorrhage 2011   recurrent  . Hyperlipidemia   . Osteoporosis   . Polymyositis ([place])   . Unspecified essential  hypertension   . Vitamin D deficiency     Family History  family history includes Dementia in her mother; Hypertension in her father; Malignant hyperthermia in her father.  Prior Rehab/Hospitalizations:  Has the patient had major surgery during 100 days prior to admission? No  Current Medications   Current Facility-Administered Medications:  .  acetaminophen (TYLENOL) tablet 650 mg, 650 mg, Oral, Q4H PRN **OR** acetaminophen (TYLENOL) solution 650 mg, 650 mg, Per Tube, Q4H PRN **OR** acetaminophen (TYLENOL) suppository 650 mg, 650 mg, Rectal, Q4H PRN, Emokpae, Ejiroghene E, MD .  ALPRAZolam (XANAX) tablet 0.25 mg, 0.25 mg, Oral, BID PRN, Emokpae,  Ejiroghene E, MD .  amoxicillin-clavulanate (AUGMENTIN) 875-125 MG per tablet 1 tablet, 1 tablet, Oral, Q12H, Emokpae, Ejiroghene E, MD, 1 tablet at 06/12/17 1044 .  aspirin chewable tablet 81 mg, 81 mg, Oral, Daily, Emokpae, Ejiroghene E, MD, 81 mg at 06/12/17 1047 .  atorvastatin (LIPITOR) tablet 10 mg, 10 mg, Oral, q1800, Caren Griffins, MD, 10 mg at 06/11/17 1751 .  calcium carbonate (OS-CAL - dosed in mg of elemental calcium) tablet 1,000 mg of elemental calcium, 2 tablet, Oral, Daily, Emokpae, Ejiroghene E, MD, 1,000 mg of elemental calcium at 06/12/17 1046 .  clopidogrel (PLAVIX) tablet 75 mg, 75 mg, Oral, Daily, Garvin Fila, MD, 75 mg at 06/12/17 1047 .  enoxaparin (LOVENOX) injection 40 mg, 40 mg, Subcutaneous, Q24H, Emokpae, Ejiroghene E, MD, 40 mg at 06/12/17 0409 .  febuxostat (ULORIC) tablet 40 mg, 40 mg, Oral, Daily, Emokpae, Ejiroghene E, MD, 40 mg at 06/12/17 1045 .  feeding supplement (ENSURE ENLIVE) (ENSURE ENLIVE) liquid 237 mL, 237 mL, Oral, BID BM, Gherghe, Costin M, MD, 237 mL at 06/11/17 1430 .  fluticasone (FLONASE) 50 MCG/ACT nasal spray 1 spray, 1 spray, Each Nare, Daily PRN, Emokpae, Ejiroghene E, MD, 1 spray at 06/11/17 0906 .  heparin lock flush 100 unit/mL, 500 Units, Intracatheter, Q30 days, 500 Units at 06/10/17 0519 **AND** heparin lock flush 100 unit/mL, 500 Units, Intracatheter, PRN, Hennie Duos, MD .  hydrALAZINE (APRESOLINE) injection 10 mg, 10 mg, Intravenous, Q4H PRN, Emokpae, Ejiroghene E, MD .  mycophenolate (CELLCEPT) capsule 500 mg, 500 mg, Oral, BID, Emokpae, Ejiroghene E, MD, 500 mg at 06/12/17 1044 .  ondansetron (ZOFRAN) injection 4 mg, 4 mg, Intravenous, Q8H PRN, Caren Griffins, MD, 4 mg at 06/11/17 0919 .  pantoprazole (PROTONIX) EC tablet 40 mg, 40 mg, Oral, Daily, Emokpae, Ejiroghene E, MD, 40 mg at 06/12/17 1044 .  senna-docusate (Senokot-S) tablet 1 tablet, 1 tablet, Oral, QHS PRN, Emokpae, Ejiroghene E, MD  Patients Current Diet:   Diet Order           DIET DYS 3 Room service appropriate? Yes; Fluid consistency: Thin  Diet effective now          Precautions / Restrictions Precautions Precautions: Shoulder, Fall Type of Shoulder Precautions: L shoulder with subluxation Precaution Comments: good carryover of precautions for RUE from earlier PT session Restrictions Weight Bearing Restrictions: No   Has the patient had 2 or more falls or a fall with injury in the past year?No  Prior Activity Level Community (5-7x/wk): pt uses [employer] bus and gets out daily with use of Copy / Equipment Home Assistive Devices/Equipment: Chana Bode (specify type), Wheelchair Home Equipment: Wheelchair - power, Adaptive equipment, Grab bars - toilet  Prior Device Use: Indicate devices/aids used by the patient prior to current illness, exacerbation or injury? Motorized wheelchair or scooter  Prior Functional Level Prior Function Level of Independence: Needs assistance Gait / Transfers Assistance Needed: son helps with bed mobility and transfers ADL's / Homemaking Assistance Needed: pan bathes per pt, family assist with home mgt  Self Care: Did the patient need help bathing, dressing, using the toilet or eating?  Needed some help (set up for sponge bathing; clothing management for toileting; set up for eating).   Indoor Mobility: Did the patient need assistance with walking from room to room (with or without device)? Dependent  Use of powerchair at baseline  Stairs: Did the patient need assistance with internal or external stairs (with or without device)? Dependent  Functional Cognition: Did the patient need help planning regular tasks such as shopping or remembering to take medications? Independent  Current Functional Level Cognition  Arousal/Alertness: Awake/alert Overall Cognitive Status: Within Functional Limits for tasks assessed Orientation Level: Oriented X4 Attention:  Selective Selective Attention: Appears intact Memory: Appears intact Awareness: Appears intact Problem Solving: Appears intact    Extremity Assessment (includes Sensation/Coordination)  Upper Extremity Assessment: RUE deficits/detail, LUE deficits/detail, Generalized weakness RUE Deficits / Details: zero AROM, flaccid tone RUE Coordination: decreased fine motor, decreased gross motor LUE Deficits / Details: L shoulder subluxation, AROM impaired in all planes LUE Coordination: decreased fine motor, decreased gross motor  Lower Extremity Assessment: Defer to PT evaluation RLE Deficits / Details: noted assymterical weakness R weaker than Left <3/5 gross motions RLE Coordination: decreased fine motor, decreased gross motor LLE Deficits / Details: generalized weakness noted  LLE Coordination: decreased fine motor, decreased gross motor    ADLs  Overall ADL's : Needs assistance/impaired Eating/Feeding: Sitting, Total assistance Eating/Feeding Details (indicate cue type and reason): pt unable to feed self due to impaired grip strength and impaired AROM of B UES Grooming: Wash/dry face, Maximal assistance, Bed level Grooming Details (indicate cue type and reason): HOH Upper Body Bathing: Total assistance Lower Body Bathing: Total assistance Upper Body Dressing : Total assistance Lower Body Dressing: Total assistance Toilet Transfer: +2 for physical assistance, +2 for safety/equipment, Maximal assistance Toilet Transfer Details (indicate cue type and reason): pt transeferred with max A + 2 lateral scoot to drop arm recliner from sitting EOB Toileting- Clothing Manipulation and Hygiene: Total assistance Functional mobility during ADLs: Total assistance, +2 for physical assistance, +2 for safety/equipment General ADL Comments: Poor dynamic sitting balance, Fair static sitting balance sitting EOB    Mobility  Overal bed mobility: Needs Assistance Bed Mobility: Rolling Rolling: Min assist,  Max assist Supine to sit: Mod assist, +2 for physical assistance General bed mobility comments: min assist to roll to right using bed rail, max assist to roll to L side, bed pad used to complete turn; vc for sequencing and using BLE bent at knees for technique    Transfers  Overall transfer level: Needs assistance Transfers: Lateral/Scoot Transfers  Lateral/Scoot Transfers: Max assist, +2 physical assistance General transfer comment: Pt declined transfer at this time    Ambulation / Gait / Stairs / Emergency planning/management officer  Ambulation/Gait General Gait Details: non ambulatory    Posture / Balance Dynamic Sitting Balance Sitting balance - Comments: able to sit EOB unsupported but demonstrates a left bias at times Balance Overall balance assessment: Needs assistance Sitting-balance support: Feet supported Sitting balance-Leahy Scale: Fair Sitting balance - Comments: able to sit EOB unsupported but demonstrates a left bias at times Postural control: Left lateral lean(varies)    Special needs/care consideration BiPAP/CPAP: No CPM:No Continuous Drip IV: No Dialysis: No  Days: No Life Vest: No Oxygen: No Special Bed: No Trach Size: No Wound Vac (area): No      Location: No Skin: nothing of consideration                               Location: NA Bowel mgmt:06/09/17 Bladder mgmt:continent  Diabetic mgmt: NA     Previous Home Environment Living Arrangements: Children, Other relatives  Lives With: Son Available Help at Discharge: Family Type of Home: Apartment Home Layout: One level Home Access: Level entry Bathroom Shower/Tub: Optometrist: Yes Home Care Services: Yes Type of Home Care Services: [place] (if known): unknown  Discharge Living Setting Plans for Discharge Living Setting: Patient's home, Lives with (comment)(lives with son) Type of Home at Discharge: Apartment Discharge Home Layout: One  level, Other (Comment)(handicapped accessible) Discharge Home Access: Level entry Discharge Bathroom Shower/Tub: Tub/shower unit(pt only sponge bathes. ) Discharge Bathroom Toilet: Standard Discharge Bathroom Accessibility: Yes How Accessible: Accessible via wheelchair Does the patient have any problems obtaining your medications?: No  Social/Family/Support Systems Patient Roles: Parent Contact Information: (son: Carrolyn Leigh [phone]; daugther: Hal Morales [phone]) Anticipated Caregiver: son and daugther Anticipated Caregiver's Contact Information: see above Ability/Limitations of Caregiver: son: currently works 3days/week; daughter can provide Min A (son performing Mod/Max transfers at [facility]) Caregiver Availability: 24/7(son and daugther adament they can provide assistance needed)  Discharge Plan Discussed with Primary Caregiver: Yes(son and daughter in agreement with plan); Aware of recommended level of Assist at DC; reports they will do anything at DC to give her that support; son indicating he has word from [facility] agency regarding additional aide support. Son also said he can take a leave of absence from work if need be. Is Caregiver In Agreement with Plan?: Yes Does Caregiver/Family have Issues with Lodging/Transportation while Pt is in Rehab?: No   Goals/Additional Needs Patient/Family Goal for Rehab: PT-Min to Mod A; OT-Min to Mod A; SLF-Mod I/Supervision  Expected length of stay: 17-20 days Cultural Considerations: Christian  Dietary Needs: DYS 3; thin liquids; ground meats:extra gravy/sauce(no tomatoes; light salt diet) Equipment Needs: TBD Special Service Needs: NA Additional Information: NA Pt/Family Agrees to Admission and willing to participate: Yes Program Orientation Provided & Reviewed with Pt/Caregiver Including Roles  & Responsibilities: Yes(pt, son, and daughter) Additional Information Needs: Son says he has heard from [facility] agency he can get up to 36 hours of care? Will  need to check into that Information Needs to be Provided By: SW  Barriers to Discharge: Lack of/limited family support   Decrease burden of Care through IP rehab admission: Decrease number of caregivers   Possible need for SNF placement upon discharge: Not anticipated    Patient Condition: This patient's condition remains as documented in the consult dated 06/10/17, in which the Rehabilitation Physician determined and documented that the patient's condition is appropriate for intensive rehabilitative care in an inpatient rehabilitation facility. Will admit to inpatient rehab today.  Preadmission Screen Completed By:  Jhonnie Garner, 06/12/2017 11:22 AM ______________________________________________________________________   Discussed status with Dr. Naaman Plummer on 06/11/17 at 11:16AM and received telephone approval for admission today.  Admission Coordinator:  Jhonnie Garner, time 11/16AM Sudie Grumbling: 06/12/17

## 2017-06-11 NOTE — Progress Notes (Signed)
Physical Therapy Treatment Patient Details Name: [name] [name] MRN: [id number] DOB: February 15, 1940 Today's Date: 06/11/2017    History of Present Illness 77 y.o. female past medical history of hypertension, polymyositis, diastolic CHF, colonic AVM, was brought into the [facility] emergency room on 06/09/2017 with complaints of generalized numbness and weakness that started around 5 AM on the morning of 06/09/2017.  Imaging revealed mutilple scattered left MCA territory strokes    PT Comments    Pt participated with LE/UE exercises/neuromuscular re-education and graded, repeated rolling; pt declined EOB; continue PT plan of care; pt and family are agreeable to CIR, if an option; son is able to continue to provide transfer assist at home  Follow Up Recommendations  CIR;Supervision/Assistance - 24 hour     Equipment Recommendations  None recommended by PT    Recommendations for Other Services       Precautions / Restrictions Precautions Precautions: Shoulder;Fall Type of Shoulder Precautions: L shoulder with subluxation Precaution Comments: on arrival R UE laying across bed rail; educated on awareness and positioning, P/AAROM  RUE    Mobility  Bed Mobility Overal bed mobility: Needs Assistance Bed Mobility: Rolling Rolling: Min assist;Max assist         General bed mobility comments: min assist to roll to right using bed rail, assist to flex knees, cues for technique; max assist to roll to L side, bed pad used to complete turn; repeated x3 to right, x2 to pt Left  Transfers                 General transfer comment: encouraged pt to sit EOB, pt declined stating she was too "full from lunch and tired"  Ambulation/Gait                 Stairs             Wheelchair Mobility    Modified Rankin (Stroke Patients Only)       Balance                                            Cognition Arousal/Alertness: Awake/alert Behavior During  Therapy: WFL for tasks assessed/performed Overall Cognitive Status: Within Functional Limits for tasks assessed                                        Exercises General Exercises - Upper Extremity Shoulder Flexion: AAROM;Right;10 reps;Supine Shoulder Horizontal ABduction: AAROM;PROM;Right;10 reps;Supine Elbow Flexion: AAROM;Right;10 reps;Supine Elbow Extension: PROM;Supine;10 reps General Exercises - Lower Extremity Ankle Circles/Pumps: AROM;AAROM;Both;10 reps Quad Sets: AROM;Both;5 reps;Limitations Quad Sets Limitations: weak bil, unable to achieve full knee extension ~15* to  20* flexion bil Heel Slides: AAROM;AROM;Both;20 reps Hip ABduction/ADduction: AAROM;Both;15 reps Straight Leg Raises: AAROM;AROM;Both;10 reps    General Comments        Pertinent Vitals/Pain Pain Assessment: No/denies pain    Home Living     Available Help at Discharge: Family Type of Home: Apartment              Prior Function            PT Goals (current goals can now be found in the care plan section) Acute Rehab PT Goals Patient Stated Goal: to get back to how she was PT Goal Formulation: With patient Time  For Goal Achievement: 06/24/17 Potential to Achieve Goals: Fair Progress towards PT goals: Progressing toward goals    Frequency    Min 3X/week      PT Plan Current plan remains appropriate    Co-evaluation              AM-PAC PT "6 Clicks" Daily Activity  Outcome Measure  Difficulty turning over in bed (including adjusting bedclothes, sheets and blankets)?: Unable Difficulty moving from lying on back to sitting on the side of the bed? : Unable Difficulty sitting down on and standing up from a chair with arms (e.g., wheelchair, bedside commode, etc,.)?: Unable Help needed moving to and from a bed to chair (including a wheelchair)?: A Lot Help needed walking in hospital room?: Total Help needed climbing 3-5 steps with a railing? : Total 6 Click  Score: 7    End of Session   Activity Tolerance: Patient tolerated treatment well Patient left: in bed;with call bell/phone within reach;with bed alarm set;with family/visitor present Nurse Communication: Mobility status PT Visit Diagnosis: Other symptoms and signs involving the nervous system (O37.858)     Time: 8502-7741 PT Time Calculation (min) (ACUTE ONLY): 24 min  Charges:  $Therapeutic Activity: 8-22 mins $Neuromuscular Re-education: 8-22 mins                    G CodesKenyon Ana, PT Pager: [phone] 06/11/2017    [facility] 06/11/2017, 12:57 PM

## 2017-06-12 ENCOUNTER — Other Ambulatory Visit: Payer: Self-pay

## 2017-06-12 ENCOUNTER — Encounter (HOSPITAL_COMMUNITY): Payer: Self-pay

## 2017-06-12 ENCOUNTER — Inpatient Hospital Stay (HOSPITAL_COMMUNITY)
Admission: RE | Admit: 2017-06-12 | Discharge: 2017-06-25 | DRG: 092 | Disposition: A | Discharge status: Skilled Nursing Facility | Payer: Medicare Other | Source: Intra-hospital | Attending: Physical Medicine & Rehabilitation | Admitting: Physical Medicine & Rehabilitation

## 2017-06-12 DIAGNOSIS — Z791 Long term (current) use of non-steroidal anti-inflammatories (NSAID): Secondary | ICD-10-CM

## 2017-06-12 DIAGNOSIS — R918 Other nonspecific abnormal finding of lung field: Secondary | ICD-10-CM | POA: Diagnosis present

## 2017-06-12 DIAGNOSIS — M792 Neuralgia and neuritis, unspecified: Secondary | ICD-10-CM

## 2017-06-12 DIAGNOSIS — Z993 Dependence on wheelchair: Secondary | ICD-10-CM

## 2017-06-12 DIAGNOSIS — I5032 Chronic diastolic (congestive) heart failure: Secondary | ICD-10-CM | POA: Diagnosis present

## 2017-06-12 DIAGNOSIS — M810 Age-related osteoporosis without current pathological fracture: Secondary | ICD-10-CM | POA: Diagnosis present

## 2017-06-12 DIAGNOSIS — J069 Acute upper respiratory infection, unspecified: Secondary | ICD-10-CM | POA: Diagnosis present

## 2017-06-12 DIAGNOSIS — F410 Panic disorder [episodic paroxysmal anxiety] without agoraphobia: Secondary | ICD-10-CM | POA: Diagnosis present

## 2017-06-12 DIAGNOSIS — E785 Hyperlipidemia, unspecified: Secondary | ICD-10-CM | POA: Diagnosis present

## 2017-06-12 DIAGNOSIS — Z8249 Family history of ischemic heart disease and other diseases of the circulatory system: Secondary | ICD-10-CM | POA: Diagnosis not present

## 2017-06-12 DIAGNOSIS — E876 Hypokalemia: Secondary | ICD-10-CM | POA: Diagnosis present

## 2017-06-12 DIAGNOSIS — G4700 Insomnia, unspecified: Secondary | ICD-10-CM | POA: Diagnosis present

## 2017-06-12 DIAGNOSIS — I110 Hypertensive heart disease with heart failure: Secondary | ICD-10-CM | POA: Diagnosis present

## 2017-06-12 DIAGNOSIS — J309 Allergic rhinitis, unspecified: Secondary | ICD-10-CM | POA: Diagnosis present

## 2017-06-12 DIAGNOSIS — K219 Gastro-esophageal reflux disease without esophagitis: Secondary | ICD-10-CM | POA: Diagnosis present

## 2017-06-12 DIAGNOSIS — R4189 Other symptoms and signs involving cognitive functions and awareness: Secondary | ICD-10-CM | POA: Diagnosis present

## 2017-06-12 DIAGNOSIS — D649 Anemia, unspecified: Secondary | ICD-10-CM | POA: Diagnosis present

## 2017-06-12 DIAGNOSIS — M3320 Polymyositis, organ involvement unspecified: Secondary | ICD-10-CM | POA: Diagnosis present

## 2017-06-12 DIAGNOSIS — Z87891 Personal history of nicotine dependence: Secondary | ICD-10-CM

## 2017-06-12 DIAGNOSIS — I10 Essential (primary) hypertension: Secondary | ICD-10-CM | POA: Diagnosis not present

## 2017-06-12 DIAGNOSIS — Z90710 Acquired absence of both cervix and uterus: Secondary | ICD-10-CM

## 2017-06-12 DIAGNOSIS — Z6830 Body mass index (BMI) 30.0-30.9, adult: Secondary | ICD-10-CM

## 2017-06-12 DIAGNOSIS — R2689 Other abnormalities of gait and mobility: Principal | ICD-10-CM | POA: Diagnosis present

## 2017-06-12 DIAGNOSIS — I63512 Cerebral infarction due to unspecified occlusion or stenosis of left middle cerebral artery: Secondary | ICD-10-CM

## 2017-06-12 DIAGNOSIS — E669 Obesity, unspecified: Secondary | ICD-10-CM | POA: Diagnosis present

## 2017-06-12 DIAGNOSIS — Z885 Allergy status to narcotic agent status: Secondary | ICD-10-CM | POA: Diagnosis not present

## 2017-06-12 DIAGNOSIS — D62 Acute posthemorrhagic anemia: Secondary | ICD-10-CM | POA: Diagnosis present

## 2017-06-12 DIAGNOSIS — I69322 Dysarthria following cerebral infarction: Secondary | ICD-10-CM

## 2017-06-12 DIAGNOSIS — Z79899 Other long term (current) drug therapy: Secondary | ICD-10-CM

## 2017-06-12 MED ORDER — ALPRAZOLAM 0.25 MG PO TABS
0.2500 mg | ORAL_TABLET | Freq: Two times a day (BID) | ORAL | Status: DC | PRN
  Administered 2017-06-14: 0.25 mg via ORAL
  Filled 2017-06-12: qty 1

## 2017-06-12 MED ORDER — ASPIRIN 81 MG PO CHEW
81.0000 mg | CHEWABLE_TABLET | Freq: Every day | ORAL | Status: DC
  Administered 2017-06-13 – 2017-06-25 (×13): 81 mg via ORAL
  Filled 2017-06-12 (×13): qty 1

## 2017-06-12 MED ORDER — SENNOSIDES-DOCUSATE SODIUM 8.6-50 MG PO TABS: 1.0000 | ORAL_TABLET | Freq: Every evening | ORAL | Status: DC | PRN

## 2017-06-12 MED ORDER — ENOXAPARIN SODIUM 40 MG/0.4ML ~~LOC~~ SOLN: 40.0000 mg | SUBCUTANEOUS | Status: DC

## 2017-06-12 MED ORDER — GABAPENTIN 100 MG PO CAPS
100.0000 mg | ORAL_CAPSULE | Freq: Two times a day (BID) | ORAL | Status: DC
  Administered 2017-06-12 – 2017-06-25 (×26): 100 mg via ORAL
  Filled 2017-06-12 (×27): qty 1

## 2017-06-12 MED ORDER — MYCOPHENOLATE MOFETIL 250 MG PO CAPS
500.0000 mg | ORAL_CAPSULE | Freq: Two times a day (BID) | ORAL | Status: DC
  Administered 2017-06-12 – 2017-06-25 (×26): 500 mg via ORAL
  Filled 2017-06-12 (×27): qty 2

## 2017-06-12 MED ORDER — FLUTICASONE PROPIONATE 50 MCG/ACT NA SUSP
1.0000 | Freq: Every day | NASAL | Status: DC | PRN
  Filled 2017-06-12: qty 16

## 2017-06-12 MED ORDER — ACETAMINOPHEN 160 MG/5ML PO SOLN: 650.0000 mg | ORAL | Status: DC | PRN

## 2017-06-12 MED ORDER — ACETAMINOPHEN 650 MG RE SUPP: 650.0000 mg | RECTAL | Status: DC | PRN

## 2017-06-12 MED ORDER — CLOPIDOGREL BISULFATE 75 MG PO TABS: 75.0000 mg | ORAL_TABLET | Freq: Every day | ORAL | Status: DC

## 2017-06-12 MED ORDER — ENSURE ENLIVE PO LIQD
237.0000 mL | Freq: Two times a day (BID) | ORAL | Status: DC
  Administered 2017-06-13 – 2017-06-25 (×20): 237 mL via ORAL

## 2017-06-12 MED ORDER — FEBUXOSTAT 40 MG PO TABS
40.0000 mg | ORAL_TABLET | Freq: Every day | ORAL | Status: DC
  Administered 2017-06-13 – 2017-06-25 (×13): 40 mg via ORAL
  Filled 2017-06-12 (×13): qty 1

## 2017-06-12 MED ORDER — ACETAMINOPHEN 325 MG PO TABS
650.0000 mg | ORAL_TABLET | ORAL | Status: DC | PRN
  Administered 2017-06-18 – 2017-06-23 (×6): 650 mg via ORAL
  Filled 2017-06-12 (×7): qty 2

## 2017-06-12 MED ORDER — ATORVASTATIN CALCIUM 10 MG PO TABS: 10.0000 mg | ORAL_TABLET | Freq: Every day | ORAL | Status: DC

## 2017-06-12 MED ORDER — ATENOLOL 50 MG PO TABS
50.0000 mg | ORAL_TABLET | Freq: Every day | ORAL | Status: DC
  Administered 2017-06-12 – 2017-06-25 (×14): 50 mg via ORAL
  Filled 2017-06-12 (×15): qty 1

## 2017-06-12 MED ORDER — FUROSEMIDE 40 MG PO TABS
40.0000 mg | ORAL_TABLET | Freq: Every day | ORAL | Status: DC
  Administered 2017-06-12 – 2017-06-25 (×14): 40 mg via ORAL
  Filled 2017-06-12 (×14): qty 1

## 2017-06-12 MED ORDER — ENOXAPARIN SODIUM 40 MG/0.4ML ~~LOC~~ SOLN
40.0000 mg | SUBCUTANEOUS | Status: DC
  Administered 2017-06-13 – 2017-06-14 (×2): 40 mg via SUBCUTANEOUS
  Filled 2017-06-12 (×2): qty 0.4

## 2017-06-12 MED ORDER — ATORVASTATIN CALCIUM 10 MG PO TABS
10.0000 mg | ORAL_TABLET | Freq: Every day | ORAL | Status: DC
  Administered 2017-06-12 – 2017-06-24 (×13): 10 mg via ORAL
  Filled 2017-06-12 (×13): qty 1

## 2017-06-12 MED ORDER — PANTOPRAZOLE SODIUM 40 MG PO TBEC
40.0000 mg | DELAYED_RELEASE_TABLET | Freq: Every day | ORAL | Status: DC
  Administered 2017-06-13 – 2017-06-25 (×13): 40 mg via ORAL
  Filled 2017-06-12 (×13): qty 1

## 2017-06-12 MED ORDER — CLOPIDOGREL BISULFATE 75 MG PO TABS
75.0000 mg | ORAL_TABLET | Freq: Every day | ORAL | Status: DC
  Administered 2017-06-13 – 2017-06-25 (×13): 75 mg via ORAL
  Filled 2017-06-12 (×13): qty 1

## 2017-06-12 MED ORDER — AMOXICILLIN-POT CLAVULANATE 875-125 MG PO TABS
1.0000 | ORAL_TABLET | Freq: Two times a day (BID) | ORAL | Status: DC
  Administered 2017-06-12 – 2017-06-17 (×10): 1 via ORAL
  Filled 2017-06-12 (×10): qty 1

## 2017-06-12 MED ORDER — ASPIRIN 81 MG PO CHEW: 81.0000 mg | CHEWABLE_TABLET | Freq: Every day | ORAL | Status: DC

## 2017-06-12 MED ORDER — CALCIUM CARBONATE 1250 (500 CA) MG PO TABS
2.0000 | ORAL_TABLET | Freq: Every day | ORAL | Status: DC
  Administered 2017-06-13 – 2017-06-25 (×13): 1000 mg via ORAL
  Filled 2017-06-12 (×13): qty 2

## 2017-06-12 NOTE — Care Management Note (Signed)
Case Management Note  Patient Details  Name: [name] [name] MRN: [id number] Date of Birth: 02/16/1940  Subjective/Objective:    Pt admitted with CVA. She is from home with son.                 Action/Plan: Pt discharging to CIR today. No further needs per CM.   Expected Discharge Date:  06/12/17               Expected Discharge Plan:  [place]  In-House Referral:     Discharge planning Services  CM Consult  Post Acute Care Choice:    Choice offered to:     DME Arranged:    DME Agency:     HH Arranged:    HH Agency:     Status of Service:  Completed, signed off  If discussed at [employer], dates discussed:    Additional Comments:  Pollie Friar, RN 06/12/2017, 11:25 AM

## 2017-06-12 NOTE — Discharge Summary (Signed)
Physician Discharge Summary  [name] [name] OFB:[id number] DOB: August 22, 1940 DOA: 06/09/2017  PCP: [name], MD  Admit date: 06/09/2017 Discharge date: 06/12/2017  Admitted From: home Disposition:  CIR  Recommendations for Outpatient Follow-up:  1. Follow up with PCP in 1-2 weeks 2. Follow up with neurology in 6 weeks 3. Continue Aspirin and Plavix for 3 months  Home Health: none Equipment/Devices: none  Discharge Condition: stable CODE STATUS: Full code Diet recommendation: heart healthy  HPI: Per Dr. Denton Brick, [name] [name] is a 77 y.o. female with medical history significant for HTN, polymyositis, diastolic CHF, AVM of the colon, presented to the [facility] ED today with complaints of numbness that started at ~5 a.m this morning, started in the upper extremities, but later reports numbness in upper and lower extremities.  At the time of my evaluation patient reports numbness has resolved.  Patient has chronic bilateral upper extremity weakness from bilateral shoulder problems, also is wheelchair-bound, so not exactly able to tell if she has associated weakness of her extremities.  Patient's son who is patient primary care giver also reported change in speech-speech slowed but not slurred. Patient is not on daily aspirin.  Patient also reports cough-productive of whitish sputum of 2 weeks duration, with associated nasal congestion.   Hospital Course: Acute CVA -Neurology consulted and followed patient while hospitalized. She underwent an MRI which showed patchy acute nonhemorrhagic infarcts in the left frontoparietal/MCA territories, it also did show old right basal ganglia and old right thalamus and small infarcts.LDL was found to be elevated, start statin, A1c 4.7. 2D echo showed normal EF 60-65%, grade 1 DD. PT recommended inpatient rehab and will be discharged in stable condition.  Hypertension -resume home medications Pulmonary nodules -No history of smoking, will need repeat CT as  an outpatient as per guidelines, defer to primary care Polymyositis with significant deconditioning -Continue CellCept Chronic diastolic CHF -2D echo with normal EF URI -Patient has been having URI type symptoms with cough sputum production for the past 3 weeks. She has been getting antibiotics for a few days and has just finished a steroid taper. Completed 3 days of Augmentin in the hospital, cough improved. Chest x-ray was negative    Discharge Diagnoses:  Active Problems:   Essential hypertension   Polymyositis (HCC)   Chronic diastolic CHF (congestive heart failure) (HCC)   CVA (cerebral vascular accident) ([place])   Multiple pulmonary nodules   Neurologic deficit due to acute ischemic stroke (HCC)   Hypokalemia   Chronic diastolic congestive heart failure ([place])   AVM (arteriovenous malformation)     Discharge Instructions   Allergies as of 06/12/2017      Reactions   Codeine Other (See Comments)   Feels drunk   Hydrocodone    "makes me sleepy"   Tramadol Hcl Other (See Comments)   Makes me feel drunk      Medication List    STOP taking these medications   predniSONE 20 MG tablet Commonly known as:  DELTASONE     TAKE these medications   acetaminophen 650 MG CR tablet Commonly known as:  TYLENOL 8 HOUR Take 1 tablet (650 mg total) by mouth every 8 (eight) hours as needed. What changed:  reasons to take this   allopurinol 300 MG tablet Commonly known as:  ZYLOPRIM Take 300 mg by mouth daily.   ALPRAZolam 0.25 MG tablet Commonly known as:  XANAX Take 0.25 mg by mouth 2 (two) times daily as needed for anxiety or sleep.  aspirin 81 MG chewable tablet Chew 1 tablet (81 mg total) by mouth daily.   atenolol 50 MG tablet Commonly known as:  TENORMIN Take 50 mg by mouth daily.   atorvastatin 10 MG tablet Commonly known as:  LIPITOR Take 1 tablet (10 mg total) by mouth daily at 6 PM.   benzonatate 100 MG capsule Commonly known as:  TESSALON TK 1 C PO TID  PRN COUGH   CALTRATE 600 1500 (600 Ca) MG Tabs tablet Generic drug:  calcium carbonate Take 2 tablets by mouth daily.   CARAFATE 1 GM/10ML suspension Generic drug:  sucralfate Take 1 g by mouth 2 (two) times daily as needed (GAS/INDIGESTION).   clopidogrel 75 MG tablet Commonly known as:  PLAVIX Take 1 tablet (75 mg total) by mouth daily.   colchicine 0.6 MG tablet Take 0.6 mg by mouth. Take 1 tablet by mouth every 8 hours as needed for gout flare   DUREZOL 0.05 % Emul Generic drug:  Difluprednate Place 1 drop into both eyes daily.   esomeprazole 40 MG capsule Commonly known as:  NEXIUM Take one capsule by thirty minutes before breakfast Take one capsule by thirty minutes before dinner What changed:    how much to take  how to take this  when to take this  additional instructions   fluticasone 50 MCG/ACT nasal spray Commonly known as:  FLONASE Place 1 spray into both nostrils daily as needed for allergies.   folic acid 580 MCG tablet Commonly known as:  FOLVITE Take 400 mcg by mouth daily.   furosemide 40 MG tablet Commonly known as:  LASIX Take 40 mg by mouth daily.   gabapentin 100 MG capsule Commonly known as:  NEURONTIN TAKE ONE CAPSULE BY MOUTH TWICE DAILY What changed:    how much to take  how to take this  when to take this   loratadine 10 MG tablet Commonly known as:  CLARITIN Take 10 mg by mouth daily as needed. For allergies.   mycophenolate 500 MG tablet Commonly known as:  CELLCEPT 500 mg TWICE DAILY   omega-3 acid ethyl esters 1 g capsule Commonly known as:  LOVAZA Take 2 g by mouth 2 (two) times daily.   OVER THE COUNTER MEDICATION Take 1 capsule by mouth daily. Beet Root   PAPAYA PO Take 1 tablet by mouth daily.   potassium chloride SA 20 MEQ tablet Commonly known as:  K-DUR,KLOR-CON Take 2 tablets (40 mEq total) by mouth 2 (two) times daily.   PROLENSA 0.07 % Soln Generic drug:  Bromfenac Sodium Place 1 drop into both  eyes daily. As directed   psyllium 95 % Pack Commonly known as:  HYDROCIL/METAMUCIL Take 1 packet by mouth daily. What changed:    when to take this  reasons to take this   sodium chloride 0.65 % nasal spray Commonly known as:  OCEAN Place 1 spray into the nose at bedtime as needed for congestion (allergies).   ULORIC 40 MG tablet Generic drug:  febuxostat Take 40 mg by mouth daily.   VITAMIN A PO Take 1 tablet by mouth daily.   vitamin B-12 1000 MCG tablet Commonly known as:  CYANOCOBALAMIN Take 1,000 mcg by mouth daily.   vitamin C 1000 MG tablet Take 1,000 mg by mouth daily.   VITAMIN D PO Take 2 tablets by mouth daily.   VITAMIN E PO Take 1 tablet by mouth.        Consultations:  Neurology   Procedures/Studies:  2D echo  Impressions: - LVEF 60-65%, mild LVH, normal wall motion, grade 1 DD, elevated LV filling pressure, aortic sclerosis with trivial AI, trivial MR, normal LA, moderate TR, RVSP 42 mmHg, normal IVC.  Ct Angio Head W Or Wo Contrast  Result Date: 06/09/2017 CLINICAL DATA:  Generalized numbness beginning in bilateral hands this morning. Follow-up infarct. History of hyperlipidemia. EXAM: CT ANGIOGRAPHY HEAD AND NECK TECHNIQUE: Multidetector CT imaging of the head and neck was performed using the standard protocol during bolus administration of intravenous contrast. Multiplanar CT image reconstructions and MIPs were obtained to evaluate the vascular anatomy. Carotid stenosis measurements (when applicable) are obtained utilizing NASCET criteria, using the distal internal carotid diameter as the denominator. CONTRAST:  33mL ISOVUE-370 IOPAMIDOL (ISOVUE-370) INJECTION 76% COMPARISON:  MRI of the head Jun 09, 2017 FINDINGS: CTA NECK FINDINGS: AORTIC ARCH: Normal appearance of the thoracic arch, normal branch pattern. Mild calcific atherosclerosis. The origins of the innominate, left Common carotid artery and subclavian artery are widely patent. RIGHT  CAROTID SYSTEM: Common carotid artery is patent. Normal appearance of the carotid bifurcation without hemodynamically significant stenosis by NASCET criteria. Normal appearance of the internal carotid artery. LEFT CAROTID SYSTEM: Common carotid artery is patent. Normal appearance of the carotid bifurcation without hemodynamically significant stenosis by NASCET criteria. Normal appearance of the internal carotid artery. VERTEBRAL ARTERIES:Left vertebral artery is dominant. Normal appearance of the vertebral arteries, widely patent. SKELETON: No acute osseous process though bone windows have not been submitted. Minimal grade 1 C4-5 anterolisthesis with multilevel advanced upper cervical facet arthropathy better demonstrated on today's MRI of the cervical spine. OTHER NECK: Soft tissues of the neck are nonacute though, not tailored for evaluation. RIGHT chest Port-A-Cath. UPPER CHEST: Multiple RIGHT upper lobe subsolid pulmonary nodules measuring to 5 mm. No superior mediastinal lymphadenopathy. CTA HEAD FINDINGS: ANTERIOR CIRCULATION: Patent cervical internal carotid arteries, petrous, cavernous and supra clinoid internal carotid arteries. Patent anterior and middle cerebral arteries. Multifocal severe stenosis bilateral A2 and A3 segments. Severe stenosis LEFT M2 inferior division origin. Multifocal severe stenosis bilateral mid to distal MCA including LEFT M3 segment. No large vessel occlusion, contrast extravasation or aneurysm. POSTERIOR CIRCULATION: Patent vertebral arteries, vertebrobasilar junction and basilar artery, as well as main branch vessels. Patent posterior cerebral arteries. Moderate stenosis RIGHT P2 segment. No large vessel occlusion, contrast extravasation or aneurysm. VENOUS SINUSES: Major dural venous sinuses are patent though not tailored for evaluation on this angiographic examination. ANATOMIC VARIANTS: None. DELAYED PHASE: No abnormal intracranial enhancement. MIP images reviewed. IMPRESSION:  CTA NECK: 1. No hemodynamically significant stenosis or acute vascular process in the neck. 2. **An incidental finding of potential clinical significance has been found. Multiple RIGHT upper lobe pulmonary nodules measuring less than 6 mm. Non-contrast chest CT at 3-6 months is recommended. If nodules persist and are stable at that time, consider additional non-contrast chest CT examinations at 2 and 4 years. This recommendation follows the consensus statement: Guidelines for Management of Incidental Pulmonary Nodules Detected on CT Images: From the Fleischner Society 2017; Radiology 2017; 284:228-243.** CTA HEAD: 1. No emergent large vessel occlusion. 2. Multifocal severe stenosis anterior and middle cerebral artery's may be secondary to atherosclerosis or hypertensive vasculopathy. Aortic Atherosclerosis (ICD10-I70.0). Electronically Signed   By: Elon Alas M.D.   On: 06/09/2017 20:54   Ct Head Wo Contrast  Result Date: 06/09/2017 CLINICAL DATA:  Numbness and weakness. EXAM: CT HEAD WITHOUT CONTRAST TECHNIQUE: Contiguous axial images were obtained from the base of the skull through the vertex  without intravenous contrast. COMPARISON:  10/22/2016 FINDINGS: Brain: More prominent hypodensity along the anterior limb of the right internal capsule may represent a subacute lacunar infarct. Older adjacent lacunar infarct of the basal ganglia appears stable. Stable advanced small vessel ischemic disease in the periventricular white matter. The brain demonstrates no evidence of hemorrhage, edema, mass effect, extra-axial fluid collection, hydrocephalus or mass lesion. Vascular: No hyperdense vessel or unexpected calcification. Skull: The skull shows osteopenia without evidence of fracture or bony lesions. Sinuses/Orbits: There are air-fluid levels in both visualized maxillary antra. Mucosal thickening also noted in bilateral ethmoid and sphenoid air cells. Other: None. IMPRESSION: 1. More prominent hypodensity  along the anterior limb of the right internal capsule may represent a subacute lacunar infarct. Older lacunar infarcts and advanced small vessel disease appears stable. 2. New air-fluid levels in both maxillary antra as well as mucosal thickening in bilateral ethmoid and sphenoid air cells are suggestive of active sinusitis. Electronically Signed   By: Aletta Edouard M.D.   On: 06/09/2017 15:37   Ct Angio Neck W And/or Wo Contrast  Result Date: 06/09/2017 CLINICAL DATA:  Generalized numbness beginning in bilateral hands this morning. Follow-up infarct. History of hyperlipidemia. EXAM: CT ANGIOGRAPHY HEAD AND NECK TECHNIQUE: Multidetector CT imaging of the head and neck was performed using the standard protocol during bolus administration of intravenous contrast. Multiplanar CT image reconstructions and MIPs were obtained to evaluate the vascular anatomy. Carotid stenosis measurements (when applicable) are obtained utilizing NASCET criteria, using the distal internal carotid diameter as the denominator. CONTRAST:  44mL ISOVUE-370 IOPAMIDOL (ISOVUE-370) INJECTION 76% COMPARISON:  MRI of the head Jun 09, 2017 FINDINGS: CTA NECK FINDINGS: AORTIC ARCH: Normal appearance of the thoracic arch, normal branch pattern. Mild calcific atherosclerosis. The origins of the innominate, left Common carotid artery and subclavian artery are widely patent. RIGHT CAROTID SYSTEM: Common carotid artery is patent. Normal appearance of the carotid bifurcation without hemodynamically significant stenosis by NASCET criteria. Normal appearance of the internal carotid artery. LEFT CAROTID SYSTEM: Common carotid artery is patent. Normal appearance of the carotid bifurcation without hemodynamically significant stenosis by NASCET criteria. Normal appearance of the internal carotid artery. VERTEBRAL ARTERIES:Left vertebral artery is dominant. Normal appearance of the vertebral arteries, widely patent. SKELETON: No acute osseous process though  bone windows have not been submitted. Minimal grade 1 C4-5 anterolisthesis with multilevel advanced upper cervical facet arthropathy better demonstrated on today's MRI of the cervical spine. OTHER NECK: Soft tissues of the neck are nonacute though, not tailored for evaluation. RIGHT chest Port-A-Cath. UPPER CHEST: Multiple RIGHT upper lobe subsolid pulmonary nodules measuring to 5 mm. No superior mediastinal lymphadenopathy. CTA HEAD FINDINGS: ANTERIOR CIRCULATION: Patent cervical internal carotid arteries, petrous, cavernous and supra clinoid internal carotid arteries. Patent anterior and middle cerebral arteries. Multifocal severe stenosis bilateral A2 and A3 segments. Severe stenosis LEFT M2 inferior division origin. Multifocal severe stenosis bilateral mid to distal MCA including LEFT M3 segment. No large vessel occlusion, contrast extravasation or aneurysm. POSTERIOR CIRCULATION: Patent vertebral arteries, vertebrobasilar junction and basilar artery, as well as main branch vessels. Patent posterior cerebral arteries. Moderate stenosis RIGHT P2 segment. No large vessel occlusion, contrast extravasation or aneurysm. VENOUS SINUSES: Major dural venous sinuses are patent though not tailored for evaluation on this angiographic examination. ANATOMIC VARIANTS: None. DELAYED PHASE: No abnormal intracranial enhancement. MIP images reviewed. IMPRESSION: CTA NECK: 1. No hemodynamically significant stenosis or acute vascular process in the neck. 2. **An incidental finding of potential clinical significance has been found. Multiple  RIGHT upper lobe pulmonary nodules measuring less than 6 mm. Non-contrast chest CT at 3-6 months is recommended. If nodules persist and are stable at that time, consider additional non-contrast chest CT examinations at 2 and 4 years. This recommendation follows the consensus statement: Guidelines for Management of Incidental Pulmonary Nodules Detected on CT Images: From the Fleischner Society  2017; Radiology 2017; 284:228-243.** CTA HEAD: 1. No emergent large vessel occlusion. 2. Multifocal severe stenosis anterior and middle cerebral artery's may be secondary to atherosclerosis or hypertensive vasculopathy. Aortic Atherosclerosis (ICD10-I70.0). Electronically Signed   By: Elon Alas M.D.   On: 06/09/2017 20:54   Mr Brain Wo Contrast  Result Date: 06/09/2017 CLINICAL DATA:  Generalized numbness beginning in upper extremity since this morning, weakness. Recent viral illness, on prednisone. EXAM: MRI HEAD WITHOUT CONTRAST MRI CERVICAL SPINE WITHOUT CONTRAST TECHNIQUE: Multiplanar, multiecho pulse sequences of the brain and surrounding structures, and cervical spine, to include the craniocervical junction and cervicothoracic junction, were obtained without intravenous contrast. COMPARISON:  CT HEAD Jun 09, 2017 and MRI of the cervical spine March 23, 2017 and MRI of the head April 22, 2012 FINDINGS: MRI HEAD FINDINGS INTRACRANIAL CONTENTS: Patchy reduced diffusion LEFT frontoparietal lobes with low ADC values. Subcentimeter reduced diffusion LEFT thalamus with normalized ADC values. No susceptibility artifact to suggest hemorrhage. Confluent supratentorial white matter FLAIR T2 hyperintensities. Patchy pontine T2 hyperintensities. Old RIGHT basal ganglia and RIGHT thalamus cystic small infarcts. Hazy T2 hyperintensities bilateral basal ganglia and bilateral thalami seen with chronic small vessel ischemic changes. No parenchymal brain volume loss for age. No midline shift, mass effect or masses. VASCULAR: Normal major intracranial vascular flow voids present at skull base. SKULL AND UPPER CERVICAL SPINE: No abnormal sellar expansion. No suspicious calvarial bone marrow signal. Craniocervical junction maintained. SINUSES/ORBITS: The mastoid air-cells and included paranasal sinuses are well-aerated.The included ocular globes and orbital contents are non-suspicious. OTHER: None. MRI CERVICAL SPINE  FINDINGS ALIGNMENT: Straightened cervical lordosis. Minimal grade 1 C4-5 anterolisthesis. VERTEBRAE/DISCS: Vertebral bodies are intact. Intervertebral disc and demonstrate normal morphology with mild desiccation. No abnormal or acute bone marrow signal. CORD:Cervical spinal cord is normal morphology and signal characteristics from the cervicomedullary junction to level of T2-3, the most caudal well visualized level. POSTERIOR FOSSA, VERTEBRAL ARTERIES, PARASPINAL TISSUES: No MR findings of ligamentous injury. Vertebral artery flow voids present. Included posterior fossa and paraspinal soft tissues are normal. DISC LEVELS: C2-3: No disc bulge. Uncovertebral hypertrophy and severe facet arthropathy without canal stenosis. Moderate to severe RIGHT neural foraminal narrowing. C3-4: Uncovertebral hypertrophy. Moderate to severe bilateral facet arthropathy. No canal stenosis. Moderate to severe LEFT neural foraminal narrowing. C4-5: Anterolisthesis. Severe RIGHT and moderate LEFT facet arthropathy with trace RIGHT facet effusion. No canal stenosis or neural foraminal narrowing. C5-6, C6-7: No disc bulge, canal stenosis nor neural foraminal narrowing. C7-T1: No disc bulge. Mild facet arthropathy. No canal stenosis. Mild LEFT neural foraminal narrowing. IMPRESSION: MRI HEAD: 1. Patchy acute nonhemorrhagic infarcts LEFT frontoparietal/MCA territories. Subacute subcentimeter LEFT thalamus infarct. 2. Old RIGHT basal ganglia and old RIGHT thalamus small infarcts. Severe chronic small vessel ischemic changes. MRI CERVICAL SPINE: 1. Progressed degenerative change of the cervical spine, grade 1 C4-5 anterolisthesis. 2. No canal stenosis. Moderate to severe C2-3 and C3-4 neural foraminal narrowing. Electronically Signed   By: Elon Alas M.D.   On: 06/09/2017 18:59   Mr Cervical Spine Wo Contrast  Result Date: 06/09/2017 CLINICAL DATA:  Generalized numbness beginning in upper extremity since this morning, weakness.  Recent viral  illness, on prednisone. EXAM: MRI HEAD WITHOUT CONTRAST MRI CERVICAL SPINE WITHOUT CONTRAST TECHNIQUE: Multiplanar, multiecho pulse sequences of the brain and surrounding structures, and cervical spine, to include the craniocervical junction and cervicothoracic junction, were obtained without intravenous contrast. COMPARISON:  CT HEAD Jun 09, 2017 and MRI of the cervical spine March 23, 2017 and MRI of the head April 22, 2012 FINDINGS: MRI HEAD FINDINGS INTRACRANIAL CONTENTS: Patchy reduced diffusion LEFT frontoparietal lobes with low ADC values. Subcentimeter reduced diffusion LEFT thalamus with normalized ADC values. No susceptibility artifact to suggest hemorrhage. Confluent supratentorial white matter FLAIR T2 hyperintensities. Patchy pontine T2 hyperintensities. Old RIGHT basal ganglia and RIGHT thalamus cystic small infarcts. Hazy T2 hyperintensities bilateral basal ganglia and bilateral thalami seen with chronic small vessel ischemic changes. No parenchymal brain volume loss for age. No midline shift, mass effect or masses. VASCULAR: Normal major intracranial vascular flow voids present at skull base. SKULL AND UPPER CERVICAL SPINE: No abnormal sellar expansion. No suspicious calvarial bone marrow signal. Craniocervical junction maintained. SINUSES/ORBITS: The mastoid air-cells and included paranasal sinuses are well-aerated.The included ocular globes and orbital contents are non-suspicious. OTHER: None. MRI CERVICAL SPINE FINDINGS ALIGNMENT: Straightened cervical lordosis. Minimal grade 1 C4-5 anterolisthesis. VERTEBRAE/DISCS: Vertebral bodies are intact. Intervertebral disc and demonstrate normal morphology with mild desiccation. No abnormal or acute bone marrow signal. CORD:Cervical spinal cord is normal morphology and signal characteristics from the cervicomedullary junction to level of T2-3, the most caudal well visualized level. POSTERIOR FOSSA, VERTEBRAL ARTERIES, PARASPINAL TISSUES: No MR  findings of ligamentous injury. Vertebral artery flow voids present. Included posterior fossa and paraspinal soft tissues are normal. DISC LEVELS: C2-3: No disc bulge. Uncovertebral hypertrophy and severe facet arthropathy without canal stenosis. Moderate to severe RIGHT neural foraminal narrowing. C3-4: Uncovertebral hypertrophy. Moderate to severe bilateral facet arthropathy. No canal stenosis. Moderate to severe LEFT neural foraminal narrowing. C4-5: Anterolisthesis. Severe RIGHT and moderate LEFT facet arthropathy with trace RIGHT facet effusion. No canal stenosis or neural foraminal narrowing. C5-6, C6-7: No disc bulge, canal stenosis nor neural foraminal narrowing. C7-T1: No disc bulge. Mild facet arthropathy. No canal stenosis. Mild LEFT neural foraminal narrowing. IMPRESSION: MRI HEAD: 1. Patchy acute nonhemorrhagic infarcts LEFT frontoparietal/MCA territories. Subacute subcentimeter LEFT thalamus infarct. 2. Old RIGHT basal ganglia and old RIGHT thalamus small infarcts. Severe chronic small vessel ischemic changes. MRI CERVICAL SPINE: 1. Progressed degenerative change of the cervical spine, grade 1 C4-5 anterolisthesis. 2. No canal stenosis. Moderate to severe C2-3 and C3-4 neural foraminal narrowing. Electronically Signed   By: Elon Alas M.D.   On: 06/09/2017 18:59   Ct Abdomen Pelvis W Contrast  Result Date: 05/15/2017 CLINICAL DATA:  Left upper and mid abdominal pain for 4 days. Abdominal pain, diverticulitis suspected. EXAM: CT ABDOMEN AND PELVIS WITH CONTRAST TECHNIQUE: Multidetector CT imaging of the abdomen and pelvis was performed using the standard protocol following bolus administration of intravenous contrast. CONTRAST:  147mL ISOVUE-300 IOPAMIDOL (ISOVUE-300) INJECTION 61% COMPARISON:  Renal ultrasound 01/02/2017. CT of the abdomen pelvis 05/03/2015. FINDINGS: Lower chest: Linear atelectasis or scarring is again seen at the right lung base. There is minimal atelectasis at the left  lung base. Heart size is normal. Coronary artery calcifications are present. No significant pleural or pericardial effusion is present. Hepatobiliary: No focal liver abnormality is seen. No gallstones, gallbladder wall thickening, or biliary dilatation. Pancreas: Unremarkable. No pancreatic ductal dilatation or surrounding inflammatory changes. Spleen: Normal in size without focal abnormality. Adrenals/Urinary Tract: The adrenal glands are normal. Right upper pole  simple cyst of the right kidney measures 3.2 cm. Other subcentimeter cortical cysts are stable. Subcortical cysts are present in the left kidney is well. No stone or obstruction is present. No other mass lesion is present. Ureters and urinary bladder are within normal limits. Stomach/Bowel: The stomach and duodenum are within normal limits. Small bowel is unremarkable. Terminal ileum is within normal limits. Appendix is not discretely visualized and may be surgically absent. The ascending and proximal transverse colon are normal. Diverticular changes are present within the descending sigmoid colon. There is mild inflammatory change surrounding the splenic flexure. There is some thickening of the distal sigmoid wall without inflammatory change. This is likely due to under distention. Vascular/Lymphatic: Atherosclerotic calcifications are present within the aorta and branch vessels. Bilateral renal artery ostia calcifications are present. Calcifications extend into the iliac vessels bilaterally without aneurysm or focal stenosis. No significant adenopathy is present. Reproductive: Hysterectomy is noted. Adnexa are within normal limits. Other: No abdominal wall hernia or abnormality. No abdominopelvic ascites. Musculoskeletal: Lumbar fusion at L5-S1 is stable. A remote superior endplate fracture at L4 is stable. Vertebral body heights and alignment are otherwise stable. Leftward curvature lumbar spine is again noted. Bony pelvis is intact. No focal lytic or  blastic lesions are present. IMPRESSION: 1. Diverticulosis with subtle inflammatory changes in the splenic flexure which could reflect early diverticulitis. 2. No free air or fluid to suggest complicating features or abscess. 3. Appendix not visualized. 4.  Aortic Atherosclerosis (ICD10-I70.0). 5. Coronary artery disease 6. Degenerative and postoperative changes of the lumbosacral spine. 7. Stable renal cysts. Electronically Signed   By: San Morelle M.D.   On: 05/15/2017 13:08   Dg Chest Port 1 View  Result Date: 06/09/2017 CLINICAL DATA:  Weakness EXAM: PORTABLE CHEST 1 VIEW COMPARISON:  01/02/2017 FINDINGS: Porta catheter on the right with tip at the upper cavoatrial junction. Borderline heart size. Chronic right diaphragm elevation with scarring at the right base. There is no edema, consolidation, effusion, or pneumothorax. No acute osseous finding. Distal right clavicle resection. IMPRESSION: Stable from prior.  No evidence of active disease. Electronically Signed   By: Monte Fantasia M.D.   On: 06/09/2017 16:13   Dg Swallowing Func-speech Pathology  Result Date: 06/10/2017 Objective Swallowing Evaluation: Type of Study: MBS-Modified Barium Swallow Study  Patient Details Name: CAROLL WEINHEIMER MRN: [id number] Date of Birth: 1940/12/16 Today's Date: 06/10/2017 Time: SLP Start Time (ACUTE ONLY): 0825 -SLP Stop Time (ACUTE ONLY): 0859 SLP Time Calculation (min) (ACUTE ONLY): 34 min Past Medical History: Past Medical History: Diagnosis Date . Allergic rhinitis, cause unspecified  . Benign neoplasm of colon  . Esophageal reflux  . Excessive daytime sleepiness  . 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 GI hemorrhage 2011  recurrent . Hyperlipidemia  . Osteoporosis  . Polymyositis ([place])  . Unspecified essential hypertension  . Vitamin D deficiency  Past Surgical History: Past Surgical History: Procedure Laterality Date . ABDOMINAL HYSTERECTOMY    partial . bilateral  rotator cuff surgery   .  COLONOSCOPY  01/09/2011  others also . COLONOSCOPY  05/29/2011  Procedure: COLONOSCOPY;  Surgeon: Gatha Mayer, MD;  Location: [facility];  Service: Endoscopy;  Laterality: N/A;  Greggory Brandy Carlean Purl . COLONOSCOPY WITH PROPOFOL N/A 02/16/2014  Procedure: COLONOSCOPY WITH PROPOFOL;  Surgeon: Milus Banister, MD;  Location: [facility];  Service: Endoscopy;  Laterality: N/A; . ESOPHAGOGASTRODUODENOSCOPY  12/08/2010  others also . ESOPHAGOGASTRODUODENOSCOPY (EGD) WITH PROPOFOL N/A 02/16/2014  Procedure: ESOPHAGOGASTRODUODENOSCOPY (EGD) WITH PROPOFOL;  Surgeon: Milus Banister, MD;  Location: [facility];  Service: Endoscopy;  Laterality: N/A; . EUS   . HOT HEMOSTASIS  05/29/2011  Procedure: HOT HEMOSTASIS (ARGON PLASMA COAGULATION/BICAP);  Surgeon: Gatha Mayer, MD;  Location: [facility];  Service: Endoscopy;  Laterality: N/A; . LUMBAR LAMINECTOMY   . 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  Pt initially passed RNSSS but then was noted to have foaming secretions in mouth and drooling.  Pt is wheelchair bound and has chronic upper body weakness.  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  SLP advised to order MBS in lieu of clinical evaluation.   Subjective: pt awake in chair Assessment / Plan / Recommendation CHL IP CLINICAL IMPRESSIONS 06/10/2017 Clinical Impression Pt presents with overall functional oropharyngeal swallow.  NO aspiration/consistent laryngeal penetration of consistencies tested.  Pt did have a single episode of laryngeal penetration to vocal folds with thin x1 clearing independently.  Of note, pt did cough during MBS but barium was not visualized in larynx.  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   SLP Visit Diagnosis Dysphagia, pharyngoesophageal phase (R13.14) Attention and  concentration deficit following -- Frontal lobe and executive function deficit following -- Impact on safety and function Moderate aspiration risk   CHL IP TREATMENT RECOMMENDATION 06/10/2017 Treatment Recommendations Therapy as outlined in treatment plan below   Prognosis 06/10/2017 Prognosis for Safe Diet Advancement Guarded Barriers to Reach Goals -- Barriers/Prognosis Comment -- CHL IP DIET RECOMMENDATION 06/10/2017 SLP Diet Recommendations Dysphagia 3 (Mech soft) solids;Thin liquid Liquid Administration via Cup;Straw Medication Administration Whole meds with puree Compensations Slow rate;Small sips/bites Postural Changes Seated upright at 90 degrees;Remain semi-upright after after feeds/meals (Comment)   CHL IP OTHER RECOMMENDATIONS 06/10/2017 Recommended Consults -- Oral Care Recommendations Oral care BID Other Recommendations --   CHL IP FOLLOW UP RECOMMENDATIONS 06/10/2017 Follow up Recommendations None   CHL IP FREQUENCY AND DURATION 06/10/2017 Speech Therapy Frequency (ACUTE ONLY) min 1 x/week Treatment Duration 1 week      CHL IP ORAL PHASE 06/10/2017 Oral Phase WFL Oral - Pudding Teaspoon -- Oral - Pudding Cup -- Oral - Honey Teaspoon -- Oral - Honey Cup -- Oral - Nectar Teaspoon WFL Oral - Nectar Cup -- Oral - Nectar Straw WFL Oral - Thin Teaspoon WFL Oral - Thin Cup WFL Oral - Thin Straw WFL Oral - Puree WFL Oral - Mech Soft -- Oral - Regular WFL Oral - Multi-Consistency -- Oral - Pill WFL Oral Phase - Comment --  CHL IP PHARYNGEAL PHASE 06/10/2017 Pharyngeal Phase Impaired Pharyngeal- Pudding Teaspoon -- Pharyngeal -- Pharyngeal- Pudding Cup -- Pharyngeal -- Pharyngeal- Honey Teaspoon -- Pharyngeal -- Pharyngeal- Honey Cup -- Pharyngeal -- Pharyngeal- Nectar Teaspoon Delayed swallow initiation-vallecula Pharyngeal -- Pharyngeal- Nectar Cup -- Pharyngeal -- Pharyngeal- Nectar Straw Delayed swallow initiation-vallecula Pharyngeal -- Pharyngeal- Thin Teaspoon Delayed swallow initiation-vallecula;WFL Pharyngeal  -- Pharyngeal- Thin Cup WFL;Delayed swallow initiation-vallecula Pharyngeal -- Pharyngeal- Thin Straw Penetration/Aspiration before swallow;Delayed swallow initiation-vallecula;Delayed swallow initiation-pyriform sinuses Pharyngeal Material enters airway, CONTACTS cords and then ejected out Pharyngeal- Puree WFL Pharyngeal -- Pharyngeal- Mechanical Soft -- Pharyngeal -- Pharyngeal- Regular WFL Pharyngeal -- Pharyngeal- Multi-consistency -- Pharyngeal -- Pharyngeal- Pill WFL Pharyngeal -- Pharyngeal Comment trace silent laryngeal penetration of thin via straw to vocal cords x1 of 5 boluses  CHL IP CERVICAL ESOPHAGEAL PHASE 06/10/2017 Cervical Esophageal Phase Impaired Pudding Teaspoon -- Pudding Cup -- Honey Teaspoon -- Honey Cup -- Nectar Teaspoon Reduced cricopharyngeal relaxation;Prominent cricopharyngeal segment Nectar Cup -- Nectar Straw Reduced cricopharyngeal relaxation;Prominent cricopharyngeal segment Thin Teaspoon Reduced cricopharyngeal relaxation;Prominent cricopharyngeal segment Thin Cup Reduced cricopharyngeal relaxation;Prominent cricopharyngeal segment Thin Straw Reduced cricopharyngeal relaxation;Prominent cricopharyngeal segment Puree Reduced cricopharyngeal relaxation;Prominent cricopharyngeal segment Mechanical Soft -- Regular Reduced cricopharyngeal relaxation;Prominent cricopharyngeal segment Multi-consistency -- Pill Prominent cricopharyngeal segment;Reduced cricopharyngeal relaxation Cervical Esophageal Comment appearance of possibly developing prominent cricopharyngeus that may be developing into Zenker's diverticulum,  No flowsheet data found. Macario Golds 06/10/2017, 9:35 AM    Luanna Salk, MS [facility] SLP [phone]              Subjective: - no chest pain, shortness of breath, no abdominal pain, nausea or vomiting.   Discharge Exam: Vitals:   06/12/17 0416 06/12/17 0743  BP: (!) 109/58 104/66  Pulse: (!) 110 (!) 112  Resp: 16 17  Temp: 99.3 F (37.4 C) 98.9 F (37.2 C)    SpO2: 98% 96%    General: Pt is alert, awake, not in acute distress Cardiovascular: RRR, S1/S2 +, no rubs, no gallops Respiratory: CTA bilaterally, no wheezing, no rhonchi Abdominal: Soft, NT, ND, bowel sounds +   The results of significant diagnostics from this hospitalization (including imaging, microbiology, ancillary and laboratory) are listed below for reference.     Microbiology: No results found for this or any previous visit (from the past 240 hour(s)).   Labs: BNP (last 3 results) No results for input(s): BNP in the last 8760 hours. Basic Metabolic Panel: Recent Labs  Lab 06/09/17 1648 06/09/17 1656  NA 140 142  K 3.6 3.6  CL 104 105  CO2 25  --   GLUCOSE 84 81  BUN 12 9  CREATININE 0.42* 0.50  CALCIUM 9.2  --   MG 1.6*  --    Liver Function Tests: Recent Labs  Lab 06/09/17 1648  AST 24  ALT 18  ALKPHOS 49  BILITOT 0.5  PROT 6.2*  ALBUMIN 3.6   No results for input(s): LIPASE, AMYLASE in the last 168 hours. No results for input(s): AMMONIA in the last 168 hours. CBC: Recent Labs  Lab 06/09/17 1648 06/09/17 1656  WBC 7.4  --   NEUTROABS 4.2  --   HGB 9.6* 10.5*  HCT 31.2* 31.0*  MCV 83.9  --   PLT 296  --    Cardiac Enzymes: Recent Labs  Lab 06/09/17 1648  CKTOTAL 581*   BNP: Invalid input(s): POCBNP CBG: No results for input(s): GLUCAP in the last 168 hours. D-Dimer No results for input(s): DDIMER in the last 72 hours. Hgb A1c Recent Labs    06/10/17 0545  HGBA1C 4.7*   Lipid Profile Recent Labs    06/10/17 0545  CHOL 299*  HDL 66  LDLCALC 164*  TRIG 347*  CHOLHDL 4.5   Thyroid function studies No results for input(s): TSH, T4TOTAL, T3FREE, THYROIDAB in the last 72 hours.  Invalid input(s): FREET3 Anemia work up No results for input(s): VITAMINB12, FOLATE, FERRITIN, TIBC, IRON, RETICCTPCT in the last 72 hours. Urinalysis    Component Value Date/Time   COLORURINE STRAW (A) 06/09/2017 2135   APPEARANCEUR CLEAR  06/09/2017 2135   LABSPEC 1.023 06/09/2017 2135   PHURINE 5.0 06/09/2017 2135   GLUCOSEU NEGATIVE 06/09/2017 2135   HGBUR NEGATIVE 06/09/2017 2135   BILIRUBINUR NEGATIVE 06/09/2017 2135   [place] 06/09/2017  2135   PROTEINUR NEGATIVE 06/09/2017 2135   UROBILINOGEN 0.2 04/30/2013 1803   NITRITE NEGATIVE 06/09/2017 2135   LEUKOCYTESUR TRACE (A) 06/09/2017 2135   Sepsis Labs Invalid input(s): PROCALCITONIN,  WBC,  LACTICIDVEN   Time coordinating discharge: 35 minutes  SIGNED:  Marzetta Board, MD  [facility] 06/12/2017, 10:42 AM Pager [phone]  If 7PM-7AM, please contact night-coverage [email or web address] Password [email or web address]

## 2017-06-12 NOTE — H&P (Signed)
Physical Medicine and Rehabilitation Admission H&P    Chief Complaint  Patient presents with  . Weakness  . Numbness  : HPI: [name] [name] is a 77 year old right-handed female with history of hypertension, polymyositis, diastolic congestive heart failure, colonic AVM.  Presented to [facility] emergency room 06/09/2017 with complaints of generalized numbness and weakness.  At baseline patient is essentially wheelchair-bound and has chronic bilateral upper extremity weakness attributed to polymyositis.  Son would help with bed mobility and transfers.  One level apartment with level entry.  Son works during the day but would help his mother get up in the mornings and get her to the power chair before leaving for work.  CT of head reviewed, showing anterior capsule infarction.  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  Subacute subcentimeter left thalamus infarction as well as old right basal ganglia and old right thalamus and small infarcts.  MRI cervical spine negative.  Patient did not receive TPA.  CT angiogram of head and neck with no emergent large vessel occlusion.  Incidental findings of multiple right upper lobe pulmonary nodules and recommendations of CT of chest 3 to 6 months.  Echocardiogram with ejection fraction of 22% grade 1 diastolic dysfunction.  Currently maintained on aspirin and Plavix for CVA prophylaxis x3 months.  Subcutaneous Lovenox for DVT prophylaxis.  Patient developed upper respiratory infection.  Chest x-ray showed no evidence of active pulmonary disease.  Started on Augmentin 06/10/2017.  Tolerating a regular diet.  Physical and occupational therapy evaluations completed with recommendations of physical medicine rehab consult.  Patient was admitted for a comprehensive rehab program.  Review of Systems  Constitutional: Negative for chills and fever.  HENT: Negative for hearing loss.   Eyes: Negative for blurred  vision and double vision.  Respiratory: Negative for cough and shortness of breath.   Cardiovascular: Positive for leg swelling. Negative for chest pain and palpitations.  Gastrointestinal: Positive for constipation. Negative for nausea and vomiting.       GERD  Genitourinary: Negative for dysuria and flank pain.  Musculoskeletal: Positive for myalgias.  Neurological: Positive for sensory change, speech change and focal weakness.  Psychiatric/Behavioral: The patient has insomnia.   All other systems reviewed and are negative.  Past Medical History:  Diagnosis Date  . Allergic rhinitis, cause unspecified   . Benign neoplasm of colon   . Esophageal reflux   . Excessive daytime sleepiness   . 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 GI hemorrhage 2011   recurrent  . Hyperlipidemia   . Osteoporosis   . Polymyositis ([place])   . Unspecified essential hypertension   . Vitamin D deficiency    Past Surgical History:  Procedure Laterality Date  . ABDOMINAL HYSTERECTOMY     partial  . bilateral  rotator cuff surgery    . COLONOSCOPY  01/09/2011   others also  . COLONOSCOPY  05/29/2011   Procedure: COLONOSCOPY;  Surgeon: Gatha Mayer, MD;  Location: [facility];  Service: Endoscopy;  Laterality: N/A;  Greggory Brandy Carlean Purl  . COLONOSCOPY WITH PROPOFOL N/A 02/16/2014   Procedure: COLONOSCOPY WITH PROPOFOL;  Surgeon: Milus Banister, MD;  Location: [facility];  Service: Endoscopy;  Laterality: N/A;  . ESOPHAGOGASTRODUODENOSCOPY  12/08/2010   others also  . ESOPHAGOGASTRODUODENOSCOPY (EGD) WITH PROPOFOL N/A 02/16/2014   Procedure: ESOPHAGOGASTRODUODENOSCOPY (EGD) WITH PROPOFOL;  Surgeon: Milus Banister, MD;  Location: [facility];  Service: Endoscopy;  Laterality: N/A;  .  EUS    . HOT HEMOSTASIS  05/29/2011   Procedure: HOT HEMOSTASIS (ARGON PLASMA COAGULATION/BICAP);  Surgeon: Gatha Mayer, MD;  Location: [facility];  Service: Endoscopy;  Laterality: N/A;  . LUMBAR LAMINECTOMY    .  TONSILLECTOMY  age 55   Family History  Problem Relation Age of Onset  . Dementia Mother   . Hypertension Father   . Malignant hyperthermia Father   . Colon cancer Neg Hx    Social History:  reports that she has never smoked. She has quit using smokeless tobacco. Her smokeless tobacco use included snuff. She reports that she does not drink alcohol or use drugs. Allergies:  Allergies  Allergen Reactions  . Codeine Other (See Comments)    Feels drunk  . Hydrocodone     "makes me sleepy"  . Tramadol Hcl Other (See Comments)    Makes me feel drunk   Medications Prior to Admission  Medication Sig Dispense Refill  . allopurinol (ZYLOPRIM) 300 MG tablet Take 300 mg by mouth daily.  10  . Ascorbic Acid (VITAMIN C) 1000 MG tablet Take 1,000 mg by mouth daily.     Marland Kitchen atenolol (TENORMIN) 50 MG tablet Take 50 mg by mouth daily.      . Calcium Carbonate (CALTRATE 600) 1500 MG TABS Take 2 tablets by mouth daily.     . Cholecalciferol (VITAMIN D PO) Take 2 tablets by mouth daily.     . DUREZOL 0.05 % EMUL Place 1 drop into both eyes daily.    Marland Kitchen esomeprazole (NEXIUM) 40 MG capsule Take one capsule by thirty minutes before breakfast Take one capsule by thirty minutes before dinner (Patient taking differently: Take 40 mg by mouth 2 (two) times daily before a meal. Take one capsule by thirty minutes before breakfast Take one capsule by thirty minutes before dinner) 60 capsule 3  . folic acid (FOLVITE) 277 MCG tablet Take 400 mcg by mouth daily.      . furosemide (LASIX) 40 MG tablet Take 40 mg by mouth daily.      . mycophenolate (CELLCEPT) 500 MG tablet 500 mg TWICE DAILY  1  . omega-3 acid ethyl esters (LOVAZA) 1 g capsule Take 2 g by mouth 2 (two) times daily.   10  . OVER THE COUNTER MEDICATION Take 1 capsule by mouth daily. Beet Root    . PAPAYA PO Take 1 tablet by mouth daily.    . potassium chloride SA (K-DUR,KLOR-CON) 20 MEQ tablet Take 2 tablets (40 mEq total) by mouth 2 (two) times daily.  20 tablet 0  . predniSONE (DELTASONE) 20 MG tablet TK 1 T PO ONCE D FOR 6 DAYS  0  . PROLENSA 0.07 % SOLN Place 1 drop into both eyes daily. As directed    . psyllium (HYDROCIL/METAMUCIL) 95 % PACK Take 1 packet by mouth daily. (Patient taking differently: Take 1 packet by mouth daily as needed (gas). ) 56 each 6  . ULORIC 40 MG tablet Take 40 mg by mouth daily.    Marland Kitchen VITAMIN A PO Take 1 tablet by mouth daily.    . vitamin B-12 (CYANOCOBALAMIN) 1000 MCG tablet Take 1,000 mcg by mouth daily.      Marland Kitchen VITAMIN E PO Take 1 tablet by mouth.    Marland Kitchen acetaminophen (TYLENOL 8 HOUR) 650 MG CR tablet Take 1 tablet (650 mg total) by mouth every 8 (eight) hours as needed. (Patient taking differently: Take 650 mg by mouth every 8 (eight) hours  as needed for pain. ) 30 tablet 0  . ALPRAZolam (XANAX) 0.25 MG tablet Take 0.25 mg by mouth 2 (two) times daily as needed for anxiety or sleep.     . benzonatate (TESSALON) 100 MG capsule TK 1 C PO TID PRN COUGH  0  . CARAFATE 1 GM/10ML suspension Take 1 g by mouth 2 (two) times daily as needed (GAS/INDIGESTION).     Marland Kitchen colchicine 0.6 MG tablet Take 0.6 mg by mouth. Take 1 tablet by mouth every 8 hours as needed for gout flare  5  . fluticasone (FLONASE) 50 MCG/ACT nasal spray Place 1 spray into both nostrils daily as needed for allergies.     Marland Kitchen gabapentin (NEURONTIN) 100 MG capsule TAKE ONE CAPSULE BY MOUTH TWICE DAILY (Patient taking differently: TAKE ONE CAPSULE BY MOUTH TWICE DAILY AS NEEDED FOR NERVE PAIN) 30 capsule 0  . loratadine (CLARITIN) 10 MG tablet Take 10 mg by mouth daily as needed. For allergies.    . sodium chloride (OCEAN) 0.65 % nasal spray Place 1 spray into the nose at bedtime as needed for congestion (allergies).       Drug Regimen Review Drug regimen was reviewed and remains appropriate with no significant issues identified  Home: Home Living Family/patient expects to be discharged to:: Private residence Living Arrangements: Children, Other  relatives Available Help at Discharge: Family Type of Home: Apartment Home Access: Level entry Home Layout: One level Bathroom Shower/Tub: Chiropodist: Standard Bathroom Accessibility: Yes Home Equipment: Wheelchair - power, Research scientist (life sciences), Grab bars - toilet Adaptive Equipment: Reacher  Lives With: Son   Functional History: Prior Function Level of Independence: Needs assistance Gait / Transfers Assistance Needed: son helps with bed mobility and transfers ADL's / Homemaking Assistance Needed: pan bathes per pt, family assist with home mgt  Functional Status:  Mobility: Bed Mobility Overal bed mobility: Needs Assistance Bed Mobility: Rolling Rolling: Min assist, Max assist Supine to sit: Mod assist, +2 for physical assistance General bed mobility comments: min assist to roll to right using bed rail, max assist to roll to L side, bed pad used to complete turn; vc for sequencing and using BLE bent at knees for technique Transfers Overall transfer level: Needs assistance Transfers: Lateral/Scoot Transfers  Lateral/Scoot Transfers: Max assist, +2 physical assistance General transfer comment: Pt declined transfer at this time Ambulation/Gait General Gait Details: non ambulatory    ADL: ADL Overall ADL's : Needs assistance/impaired Eating/Feeding: Sitting, Total assistance Eating/Feeding Details (indicate cue type and reason): pt unable to feed self due to impaired grip strength and impaired AROM of B UES Grooming: Wash/dry face, Maximal assistance, Bed level Grooming Details (indicate cue type and reason): HOH Upper Body Bathing: Total assistance Lower Body Bathing: Total assistance Upper Body Dressing : Total assistance Lower Body Dressing: Total assistance Toilet Transfer: +2 for physical assistance, +2 for safety/equipment, Maximal assistance Toilet Transfer Details (indicate cue type and reason): pt transeferred with max A + 2 lateral scoot to drop  arm recliner from sitting EOB Toileting- Clothing Manipulation and Hygiene: Total assistance Functional mobility during ADLs: Total assistance, +2 for physical assistance, +2 for safety/equipment General ADL Comments: Poor dynamic sitting balance, Fair static sitting balance sitting EOB  Cognition: Cognition Overall Cognitive Status: Within Functional Limits for tasks assessed Arousal/Alertness: Awake/alert Orientation Level: Oriented X4 Attention: Selective Selective Attention: Appears intact Memory: Appears intact Awareness: Appears intact Problem Solving: Appears intact Cognition Arousal/Alertness: Awake/alert Behavior During Therapy: WFL for tasks assessed/performed Overall Cognitive Status: Within  Functional Limits for tasks assessed  Physical Exam: Blood pressure (!) 109/58, pulse (!) 110, temperature 99.3 F (37.4 C), temperature source Oral, resp. rate 16, height 5\' 3"  (1.6 m), weight 77.1 kg (170 lb), SpO2 98 %. Physical Exam  Constitutional: She is oriented to person, place, and time. She appears well-developed.  HENT:  Mouth/Throat: Oropharynx is clear and moist.  Missing teeth  Eyes: Pupils are equal, round, and reactive to light. EOM are normal.  Neck: Normal range of motion. No tracheal deviation present. No thyromegaly present.  Cardiovascular: Normal rate. Exam reveals no gallop and no friction rub.  No murmur heard. Respiratory: Effort normal. No respiratory distress. She has no wheezes. She has no rales.  GI: Soft. She exhibits no distension. There is no tenderness. There is no rebound.  Musculoskeletal: Normal range of motion. She exhibits no edema or deformity.  Neurological: She is alert and oriented to person, place, and time.  Dysarthric. Right central 7. No language issues. Reasonable insight and awareness. RUE 1 to 1+/5 deltoid, biceps, triceps. RLE: 2-3/5 prox to distal with ?apraxia. LUE 4+/5. LLE 4/5. Senses pain and light touch equally on all 4.      No results found for this or any previous visit (from the past 48 hour(s)). Dg Swallowing Func-speech Pathology  Result Date: 06/10/2017 Objective Swallowing Evaluation: Type of Study: MBS-Modified Barium Swallow Study  Patient Details Name: [name] [name] MRN: [id number] Date of Birth: Apr 18, 1940 Today's Date: 06/10/2017 Time: SLP Start Time (ACUTE ONLY): 0825 -SLP Stop Time (ACUTE ONLY): 0859 SLP Time Calculation (min) (ACUTE ONLY): 34 min Past Medical History: Past Medical History: Diagnosis Date . Allergic rhinitis, cause unspecified  . Benign neoplasm of colon  . Esophageal reflux  . Excessive daytime sleepiness  . 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 GI hemorrhage 2011  recurrent . Hyperlipidemia  . Osteoporosis  . Polymyositis ([place])  . Unspecified essential hypertension  . Vitamin D deficiency  Past Surgical History: Past Surgical History: Procedure Laterality Date . ABDOMINAL HYSTERECTOMY    partial . bilateral  rotator cuff surgery   . COLONOSCOPY  01/09/2011  others also . COLONOSCOPY  05/29/2011  Procedure: COLONOSCOPY;  Surgeon: Gatha Mayer, MD;  Location: [facility];  Service: Endoscopy;  Laterality: N/A;  Greggory Brandy Carlean Purl . COLONOSCOPY WITH PROPOFOL N/A 02/16/2014  Procedure: COLONOSCOPY WITH PROPOFOL;  Surgeon: Milus Banister, MD;  Location: [facility];  Service: Endoscopy;  Laterality: N/A; . ESOPHAGOGASTRODUODENOSCOPY  12/08/2010  others also . ESOPHAGOGASTRODUODENOSCOPY (EGD) WITH PROPOFOL N/A 02/16/2014  Procedure: ESOPHAGOGASTRODUODENOSCOPY (EGD) WITH PROPOFOL;  Surgeon: Milus Banister, MD;  Location: [facility];  Service: Endoscopy;  Laterality: N/A; . EUS   . HOT HEMOSTASIS  05/29/2011  Procedure: HOT HEMOSTASIS (ARGON PLASMA COAGULATION/BICAP);  Surgeon: Gatha Mayer, MD;  Location: [facility];  Service: Endoscopy;  Laterality: N/A; . LUMBAR LAMINECTOMY   . TONSILLECTOMY  age 41 HPI: 77 yo female transferred from [facility] - found to have Left MCA and subacute Left thalamic CVA -  old right thalamus CVA.  Pt initially passed RNSSS but then was noted to have foaming secretions in mouth and drooling.  Pt is wheelchair bound and has chronic upper body weakness.  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  SLP advised to order MBS in lieu of clinical evaluation.   Subjective: pt awake in chair Assessment / Plan / Recommendation CHL IP CLINICAL IMPRESSIONS 06/10/2017 Clinical Impression Pt presents with overall functional oropharyngeal  swallow.  NO aspiration/consistent laryngeal penetration of consistencies tested.  Pt did have a single episode of laryngeal penetration to vocal folds with thin x1 clearing independently.  Of note, pt did cough during MBS but barium was not visualized in larynx.  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   SLP Visit Diagnosis Dysphagia, pharyngoesophageal phase (R13.14) Attention and concentration deficit following -- Frontal lobe and executive function deficit following -- Impact on safety and function Moderate aspiration risk   CHL IP TREATMENT RECOMMENDATION 06/10/2017 Treatment Recommendations Therapy as outlined in treatment plan below   Prognosis 06/10/2017 Prognosis for Safe Diet Advancement Guarded Barriers to Reach Goals -- Barriers/Prognosis Comment -- CHL IP DIET RECOMMENDATION 06/10/2017 SLP Diet Recommendations Dysphagia 3 (Mech soft) solids;Thin liquid Liquid Administration via Cup;Straw Medication Administration Whole meds with puree Compensations Slow rate;Small sips/bites Postural Changes Seated upright at 90 degrees;Remain semi-upright after after feeds/meals (Comment)   CHL IP OTHER RECOMMENDATIONS 06/10/2017 Recommended Consults -- Oral Care Recommendations Oral care BID Other Recommendations --   CHL IP FOLLOW UP RECOMMENDATIONS 06/10/2017 Follow up Recommendations None   CHL IP FREQUENCY AND DURATION 06/10/2017 Speech Therapy  Frequency (ACUTE ONLY) min 1 x/week Treatment Duration 1 week      CHL IP ORAL PHASE 06/10/2017 Oral Phase WFL Oral - Pudding Teaspoon -- Oral - Pudding Cup -- Oral - Honey Teaspoon -- Oral - Honey Cup -- Oral - Nectar Teaspoon WFL Oral - Nectar Cup -- Oral - Nectar Straw WFL Oral - Thin Teaspoon WFL Oral - Thin Cup WFL Oral - Thin Straw WFL Oral - Puree WFL Oral - Mech Soft -- Oral - Regular WFL Oral - Multi-Consistency -- Oral - Pill WFL Oral Phase - Comment --  CHL IP PHARYNGEAL PHASE 06/10/2017 Pharyngeal Phase Impaired Pharyngeal- Pudding Teaspoon -- Pharyngeal -- Pharyngeal- Pudding Cup -- Pharyngeal -- Pharyngeal- Honey Teaspoon -- Pharyngeal -- Pharyngeal- Honey Cup -- Pharyngeal -- Pharyngeal- Nectar Teaspoon Delayed swallow initiation-vallecula Pharyngeal -- Pharyngeal- Nectar Cup -- Pharyngeal -- Pharyngeal- Nectar Straw Delayed swallow initiation-vallecula Pharyngeal -- Pharyngeal- Thin Teaspoon Delayed swallow initiation-vallecula;WFL Pharyngeal -- Pharyngeal- Thin Cup WFL;Delayed swallow initiation-vallecula Pharyngeal -- Pharyngeal- Thin Straw Penetration/Aspiration before swallow;Delayed swallow initiation-vallecula;Delayed swallow initiation-pyriform sinuses Pharyngeal Material enters airway, CONTACTS cords and then ejected out Pharyngeal- Puree WFL Pharyngeal -- Pharyngeal- Mechanical Soft -- Pharyngeal -- Pharyngeal- Regular WFL Pharyngeal -- Pharyngeal- Multi-consistency -- Pharyngeal -- Pharyngeal- Pill WFL Pharyngeal -- Pharyngeal Comment trace silent laryngeal penetration of thin via straw to vocal cords x1 of 5 boluses   CHL IP CERVICAL ESOPHAGEAL PHASE 06/10/2017 Cervical Esophageal Phase Impaired Pudding Teaspoon -- Pudding Cup -- Honey Teaspoon -- Honey Cup -- Nectar Teaspoon Reduced cricopharyngeal relaxation;Prominent cricopharyngeal segment Nectar Cup -- Nectar Straw Reduced cricopharyngeal relaxation;Prominent cricopharyngeal segment Thin Teaspoon Reduced cricopharyngeal  relaxation;Prominent cricopharyngeal segment Thin Cup Reduced cricopharyngeal relaxation;Prominent cricopharyngeal segment Thin Straw Reduced cricopharyngeal relaxation;Prominent cricopharyngeal segment Puree Reduced cricopharyngeal relaxation;Prominent cricopharyngeal segment Mechanical Soft -- Regular Reduced cricopharyngeal relaxation;Prominent cricopharyngeal segment Multi-consistency -- Pill Prominent cricopharyngeal segment;Reduced cricopharyngeal relaxation Cervical Esophageal Comment appearance of possibly developing prominent cricopharyngeus that may be developing into Zenker's diverticulum,  No flowsheet data found. Macario Golds 06/10/2017, 9:35 AM    Luanna Salk, MS [facility] SLP [phone]               Medical Problem List and Plan: 1.  Decreased functional mobility secondary to left frontoparietal MCA infarction complicated by history of polymyositis  -admit to  inpatient rehab 2.  DVT Prophylaxis/Anticoagulation: Subcutaneous Lovenox.  Monitor for any bleeding episodes 3. Pain Management: Neurontin 100 mg twice daily Tylenol as needed 4. Mood: Xanax 0.25 mg twice daily as needed 5. Neuropsych: This patient is capable of making decisions on her own behalf. 6. Skin/Wound Care: Routine skin checks 7. Fluids/Electrolytes/Nutrition: Routine in and outs with follow-up chemistries 8.  Polymyositis.  Continue CellCept 500 mg twice daily.  Patient has been wheelchair-bound for approximately the past 8 months 9.  Chronic diastolic congestive heart failure.  Monitor for any signs of fluid overload 10.  Hypertension.  Allow permissive hypertension.   Tenormin 50 mg daily, Lasix 40 mg daily prior to admission.  11.  Pulmonary nodules.  Incidental finding on CTA of neck.  Noncontrast CT 3 to 6 months 12.  Hyperlipidemia.  Lipitor  Post Admission Physician Evaluation: 1. 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 2. Patient is admitted  to receive collaborative, interdisciplinary care between the physiatrist, rehab nursing staff, and therapy team. 3. Patient's level of medical complexity and substantial therapy needs in context of that medical necessity cannot be provided at a lesser intensity of care such as a SNF. 4. Patient has experienced substantial functional loss from his/her baseline which was documented above under the "Functional History" and "Functional Status" headings.  Judging by the patient's diagnosis, physical exam, and functional history, the patient has potential for functional progress which will result in measurable gains while on inpatient rehab.  These gains will be of substantial and practical use upon discharge  in facilitating mobility and self-care at the household level. 5. Physiatrist will provide 24 hour management of medical needs as well as oversight of the therapy plan/treatment and provide guidance as appropriate regarding the interaction of the two. 6. The Preadmission Screening has been reviewed and patient status is unchanged unless otherwise stated above. 7. 24 hour rehab nursing will assist with bladder management, bowel management, safety, skin/wound care, disease management, medication administration, pain management and patient education  and help integrate therapy concepts, techniques,education, etc. 8. PT will assess and treat for/with: Lower extremity strength, range of motion, stamina, balance, functional mobility, safety, adaptive techniques and equipment, NMR, family education .   Goals are: min to mod assist. 9. OT will assess and treat for/with: ADL's, functional mobility, safety, upper extremity strength, adaptive techniques and equipment, NMR, family education.   Goals are: min to mod assist. Therapy may proceed with showering this patient. 10. SLP will assess and treat for/with: speech, cognition, communication, family ed.  Goals are: mod I to supervision. 11. Case Management and Social  Worker will assess and treat for psychological issues and discharge planning. 12. Team conference will be held weekly to assess progress toward goals and to determine barriers to discharge. 13. Patient will receive at least 3 hours of therapy per day at least 5 days per week. 14. ELOS: 17-21 days       15. Prognosis:  excellent    I have personally performed a face to face diagnostic evaluation of this patient and formulated the key components of the plan.  Additionally, I have personally reviewed laboratory data, imaging studies, as well as relevant notes and concur with the physician assistant's documentation above.  Meredith Staggers, MD, FAAPMR   Lavon Paganini [place], PA-C 06/12/2017

## 2017-06-12 NOTE — H&P (Signed)
Physical Medicine and Rehabilitation Admission H&P       Chief Complaint  Patient presents with  . Weakness  . Numbness  : HPI: [name] [name] is a 77 year old right-handed female with history of hypertension, polymyositis, diastolic congestive heart failure, colonic AVM.  Presented to [facility] emergency room 06/09/2017 with complaints of generalized numbness and weakness.  At baseline patient is essentially wheelchair-bound and has chronic bilateral upper extremity weakness attributed to polymyositis.  Son would help with bed mobility and transfers.  One level apartment with level entry.  Son works during the day but would help his mother get up in the mornings and get her to the power chair before leaving for work.  CT of head reviewed, showing anterior capsule infarction.  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  Subacute subcentimeter left thalamus infarction as well as old right basal ganglia and old right thalamus and small infarcts.  MRI cervical spine negative.  Patient did not receive TPA.  CT angiogram of head and neck with no emergent large vessel occlusion.  Incidental findings of multiple right upper lobe pulmonary nodules and recommendations of CT of chest 3 to 6 months.  Echocardiogram with ejection fraction of 25% grade 1 diastolic dysfunction.  Currently maintained on aspirin and Plavix for CVA prophylaxis x3 months.  Subcutaneous Lovenox for DVT prophylaxis.  Patient developed upper respiratory infection.  Chest x-ray showed no evidence of active pulmonary disease.  Started on Augmentin 06/10/2017.  Tolerating a regular diet.  Physical and occupational therapy evaluations completed with recommendations of physical medicine rehab consult.  Patient was admitted for a comprehensive rehab program.  Review of Systems  Constitutional: Negative for chills and fever.  HENT: Negative for hearing loss.   Eyes: Negative for  blurred vision and double vision.  Respiratory: Negative for cough and shortness of breath.   Cardiovascular: Positive for leg swelling. Negative for chest pain and palpitations.  Gastrointestinal: Positive for constipation. Negative for nausea and vomiting.       GERD  Genitourinary: Negative for dysuria and flank pain.  Musculoskeletal: Positive for myalgias.  Neurological: Positive for sensory change, speech change and focal weakness.  Psychiatric/Behavioral: The patient has insomnia.   All other systems reviewed and are negative.      Past Medical History:  Diagnosis Date  . Allergic rhinitis, cause unspecified   . Benign neoplasm of colon   . Esophageal reflux   . Excessive daytime sleepiness   . 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 GI hemorrhage 2011   recurrent  . Hyperlipidemia   . Osteoporosis   . Polymyositis ([place])   . Unspecified essential hypertension   . Vitamin D deficiency         Past Surgical History:  Procedure Laterality Date  . ABDOMINAL HYSTERECTOMY     partial  . bilateral  rotator cuff surgery    . COLONOSCOPY  01/09/2011   others also  . COLONOSCOPY  05/29/2011   Procedure: COLONOSCOPY;  Surgeon: Gatha Mayer, MD;  Location: [facility];  Service: Endoscopy;  Laterality: N/A;  Greggory Brandy Carlean Purl  . COLONOSCOPY WITH PROPOFOL N/A 02/16/2014   Procedure: COLONOSCOPY WITH PROPOFOL;  Surgeon: Milus Banister, MD;  Location: [facility];  Service: Endoscopy;  Laterality: N/A;  . ESOPHAGOGASTRODUODENOSCOPY  12/08/2010   others also  . ESOPHAGOGASTRODUODENOSCOPY (EGD) WITH PROPOFOL N/A 02/16/2014   Procedure: ESOPHAGOGASTRODUODENOSCOPY (EGD) WITH PROPOFOL;  Surgeon: Milus Banister, MD;  Location: [facility];  Service: Endoscopy;  Laterality: N/A;  . EUS    . HOT HEMOSTASIS  05/29/2011   Procedure: HOT HEMOSTASIS (ARGON PLASMA COAGULATION/BICAP);  Surgeon: Gatha Mayer, MD;  Location: [facility];  Service: Endoscopy;   Laterality: N/A;  . LUMBAR LAMINECTOMY    . TONSILLECTOMY  age 69        Family History  Problem Relation Age of Onset  . Dementia Mother   . Hypertension Father   . Malignant hyperthermia Father   . Colon cancer Neg Hx    Social History:  reports that she has never smoked. She has quit using smokeless tobacco. Her smokeless tobacco use included snuff. She reports that she does not drink alcohol or use drugs. Allergies:       Allergies  Allergen Reactions  . Codeine Other (See Comments)    Feels drunk  . Hydrocodone     "makes me sleepy"  . Tramadol Hcl Other (See Comments)    Makes me feel drunk         Medications Prior to Admission  Medication Sig Dispense Refill  . allopurinol (ZYLOPRIM) 300 MG tablet Take 300 mg by mouth daily.  10  . Ascorbic Acid (VITAMIN C) 1000 MG tablet Take 1,000 mg by mouth daily.     Marland Kitchen atenolol (TENORMIN) 50 MG tablet Take 50 mg by mouth daily.      . Calcium Carbonate (CALTRATE 600) 1500 MG TABS Take 2 tablets by mouth daily.     . Cholecalciferol (VITAMIN D PO) Take 2 tablets by mouth daily.     . DUREZOL 0.05 % EMUL Place 1 drop into both eyes daily.    Marland Kitchen esomeprazole (NEXIUM) 40 MG capsule Take one capsule by thirty minutes before breakfast Take one capsule by thirty minutes before dinner (Patient taking differently: Take 40 mg by mouth 2 (two) times daily before a meal. Take one capsule by thirty minutes before breakfast Take one capsule by thirty minutes before dinner) 60 capsule 3  . folic acid (FOLVITE) 128 MCG tablet Take 400 mcg by mouth daily.      . furosemide (LASIX) 40 MG tablet Take 40 mg by mouth daily.      . mycophenolate (CELLCEPT) 500 MG tablet 500 mg TWICE DAILY  1  . omega-3 acid ethyl esters (LOVAZA) 1 g capsule Take 2 g by mouth 2 (two) times daily.   10  . OVER THE COUNTER MEDICATION Take 1 capsule by mouth daily. Beet Root    . PAPAYA PO Take 1 tablet by mouth daily.    . potassium  chloride SA (K-DUR,KLOR-CON) 20 MEQ tablet Take 2 tablets (40 mEq total) by mouth 2 (two) times daily. 20 tablet 0  . predniSONE (DELTASONE) 20 MG tablet TK 1 T PO ONCE D FOR 6 DAYS  0  . PROLENSA 0.07 % SOLN Place 1 drop into both eyes daily. As directed    . psyllium (HYDROCIL/METAMUCIL) 95 % PACK Take 1 packet by mouth daily. (Patient taking differently: Take 1 packet by mouth daily as needed (gas). ) 56 each 6  . ULORIC 40 MG tablet Take 40 mg by mouth daily.    Marland Kitchen VITAMIN A PO Take 1 tablet by mouth daily.    . vitamin B-12 (CYANOCOBALAMIN) 1000 MCG tablet Take 1,000 mcg by mouth daily.      Marland Kitchen VITAMIN E PO Take 1 tablet by mouth.    Marland Kitchen acetaminophen (TYLENOL 8 HOUR) 650 MG CR  tablet Take 1 tablet (650 mg total) by mouth every 8 (eight) hours as needed. (Patient taking differently: Take 650 mg by mouth every 8 (eight) hours as needed for pain. ) 30 tablet 0  . ALPRAZolam (XANAX) 0.25 MG tablet Take 0.25 mg by mouth 2 (two) times daily as needed for anxiety or sleep.     . benzonatate (TESSALON) 100 MG capsule TK 1 C PO TID PRN COUGH  0  . CARAFATE 1 GM/10ML suspension Take 1 g by mouth 2 (two) times daily as needed (GAS/INDIGESTION).     Marland Kitchen colchicine 0.6 MG tablet Take 0.6 mg by mouth. Take 1 tablet by mouth every 8 hours as needed for gout flare  5  . fluticasone (FLONASE) 50 MCG/ACT nasal spray Place 1 spray into both nostrils daily as needed for allergies.     Marland Kitchen gabapentin (NEURONTIN) 100 MG capsule TAKE ONE CAPSULE BY MOUTH TWICE DAILY (Patient taking differently: TAKE ONE CAPSULE BY MOUTH TWICE DAILY AS NEEDED FOR NERVE PAIN) 30 capsule 0  . loratadine (CLARITIN) 10 MG tablet Take 10 mg by mouth daily as needed. For allergies.    . sodium chloride (OCEAN) 0.65 % nasal spray Place 1 spray into the nose at bedtime as needed for congestion (allergies).       Drug Regimen Review Drug regimen was reviewed and remains appropriate with no significant issues  identified  Home: Home Living Family/patient expects to be discharged to:: Private residence Living Arrangements: Children, Other relatives Available Help at Discharge: Family Type of Home: Apartment Home Access: Level entry Home Layout: One level Bathroom Shower/Tub: Chiropodist: Standard Bathroom Accessibility: Yes Home Equipment: Wheelchair - power, Research scientist (life sciences), Grab bars - toilet Adaptive Equipment: Reacher  Lives With: Son   Functional History: Prior Function Level of Independence: Needs assistance Gait / Transfers Assistance Needed: son helps with bed mobility and transfers ADL's / Homemaking Assistance Needed: pan bathes per pt, family assist with home mgt  Functional Status:  Mobility: Bed Mobility Overal bed mobility: Needs Assistance Bed Mobility: Rolling Rolling: Min assist, Max assist Supine to sit: Mod assist, +2 for physical assistance General bed mobility comments: min assist to roll to right using bed rail, max assist to roll to L side, bed pad used to complete turn; vc for sequencing and using BLE bent at knees for technique Transfers Overall transfer level: Needs assistance Transfers: Lateral/Scoot Transfers  Lateral/Scoot Transfers: Max assist, +2 physical assistance General transfer comment: Pt declined transfer at this time Ambulation/Gait General Gait Details: non ambulatory  ADL: ADL Overall ADL's : Needs assistance/impaired Eating/Feeding: Sitting, Total assistance Eating/Feeding Details (indicate cue type and reason): pt unable to feed self due to impaired grip strength and impaired AROM of B UES Grooming: Wash/dry face, Maximal assistance, Bed level Grooming Details (indicate cue type and reason): HOH Upper Body Bathing: Total assistance Lower Body Bathing: Total assistance Upper Body Dressing : Total assistance Lower Body Dressing: Total assistance Toilet Transfer: +2 for physical assistance, +2 for  safety/equipment, Maximal assistance Toilet Transfer Details (indicate cue type and reason): pt transeferred with max A + 2 lateral scoot to drop arm recliner from sitting EOB Toileting- Clothing Manipulation and Hygiene: Total assistance Functional mobility during ADLs: Total assistance, +2 for physical assistance, +2 for safety/equipment General ADL Comments: Poor dynamic sitting balance, Fair static sitting balance sitting EOB  Cognition: Cognition Overall Cognitive Status: Within Functional Limits for tasks assessed Arousal/Alertness: Awake/alert Orientation Level: Oriented X4 Attention: Selective Selective Attention: Appears  intact Memory: Appears intact Awareness: Appears intact Problem Solving: Appears intact Cognition Arousal/Alertness: Awake/alert Behavior During Therapy: WFL for tasks assessed/performed Overall Cognitive Status: Within Functional Limits for tasks assessed  Physical Exam: Blood pressure (!) 109/58, pulse (!) 110, temperature 99.3 F (37.4 C), temperature source Oral, resp. rate 16, height 5\' 3"  (1.6 m), weight 77.1 kg (170 lb), SpO2 98 %. Physical Exam  Constitutional: She is oriented to person, place, and time. She appears well-developed.  HENT:  Mouth/Throat: Oropharynx is clear and moist.  Missing teeth  Eyes: Pupils are equal, round, and reactive to light. EOM are normal.  Neck: Normal range of motion. No tracheal deviation present. No thyromegaly present.  Cardiovascular: Normal rate. Exam reveals no gallop and no friction rub.  No murmur heard. Respiratory: Effort normal. No respiratory distress. She has no wheezes. She has no rales.  GI: Soft. She exhibits no distension. There is no tenderness. There is no rebound.  Musculoskeletal: Normal range of motion. She exhibits no edema or deformity.  Neurological: She is alert and oriented to person, place, and time.  Dysarthric. Right central 7. No language issues. Reasonable insight and awareness. RUE  1 to 1+/5 deltoid, biceps, triceps. RLE: 2-3/5 prox to distal with ?apraxia. LUE 4+/5. LLE 4/5. Senses pain and light touch equally on all 4.     LabResultsLast48Hours  No results found for this or any previous visit (from the past 48 hour(s)).    ImagingResults(Last48hours)  Dg Swallowing Func-speech Pathology  Result Date: 06/10/2017 Objective Swallowing Evaluation: Type of Study: MBS-Modified Barium Swallow Study  Patient Details Name: AMEISHA MCCLELLAN MRN: [id number] Date of Birth: February 17, 1940 Today's Date: 06/10/2017 Time: SLP Start Time (ACUTE ONLY): 0825 -SLP Stop Time (ACUTE ONLY): 0859 SLP Time Calculation (min) (ACUTE ONLY): 34 min Past Medical History: Past Medical History: Diagnosis Date . Allergic rhinitis, cause unspecified  . Benign neoplasm of colon  . Esophageal reflux  . Excessive daytime sleepiness  . 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 GI hemorrhage 2011  recurrent . Hyperlipidemia  . Osteoporosis  . Polymyositis ([place])  . Unspecified essential hypertension  . Vitamin D deficiency  Past Surgical History: Past Surgical History: Procedure Laterality Date . ABDOMINAL HYSTERECTOMY    partial . bilateral  rotator cuff surgery   . COLONOSCOPY  01/09/2011  others also . COLONOSCOPY  05/29/2011  Procedure: COLONOSCOPY;  Surgeon: Gatha Mayer, MD;  Location: [facility];  Service: Endoscopy;  Laterality: N/A;  Greggory Brandy Carlean Purl . COLONOSCOPY WITH PROPOFOL N/A 02/16/2014  Procedure: COLONOSCOPY WITH PROPOFOL;  Surgeon: Milus Banister, MD;  Location: [facility];  Service: Endoscopy;  Laterality: N/A; . ESOPHAGOGASTRODUODENOSCOPY  12/08/2010  others also . ESOPHAGOGASTRODUODENOSCOPY (EGD) WITH PROPOFOL N/A 02/16/2014  Procedure: ESOPHAGOGASTRODUODENOSCOPY (EGD) WITH PROPOFOL;  Surgeon: Milus Banister, MD;  Location: [facility];  Service: Endoscopy;  Laterality: N/A; . EUS   . HOT HEMOSTASIS  05/29/2011  Procedure: HOT HEMOSTASIS (ARGON PLASMA COAGULATION/BICAP);  Surgeon: Gatha Mayer,  MD;  Location: [facility];  Service: Endoscopy;  Laterality: N/A; . LUMBAR LAMINECTOMY   . TONSILLECTOMY  age 53 HPI: 77 yo female transferred from [facility] - found to have Left MCA and subacute Left thalamic CVA - old right thalamus CVA.  Pt initially passed RNSSS but then was noted to have foaming secretions in mouth and drooling.  Pt is wheelchair bound and has chronic upper body weakness.  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  SLP advised to  order MBS in lieu of clinical evaluation.   Subjective: pt awake in chair Assessment / Plan / Recommendation CHL IP CLINICAL IMPRESSIONS 06/10/2017 Clinical Impression Pt presents with overall functional oropharyngeal swallow.  NO aspiration/consistent laryngeal penetration of consistencies tested.  Pt did have a single episode of laryngeal penetration to vocal folds with thin x1 clearing independently.  Of note, pt did cough during MBS but barium was not visualized in larynx.  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   SLP Visit Diagnosis Dysphagia, pharyngoesophageal phase (R13.14) Attention and concentration deficit following -- Frontal lobe and executive function deficit following -- Impact on safety and function Moderate aspiration risk   CHL IP TREATMENT RECOMMENDATION 06/10/2017 Treatment Recommendations Therapy as outlined in treatment plan below   Prognosis 06/10/2017 Prognosis for Safe Diet Advancement Guarded Barriers to Reach Goals -- Barriers/Prognosis Comment -- CHL IP DIET RECOMMENDATION 06/10/2017 SLP Diet Recommendations Dysphagia 3 (Mech soft) solids;Thin liquid Liquid Administration via Cup;Straw Medication Administration Whole meds with puree Compensations Slow rate;Small sips/bites Postural Changes Seated upright at 90 degrees;Remain semi-upright after after feeds/meals (Comment)   CHL IP OTHER RECOMMENDATIONS 06/10/2017  Recommended Consults -- Oral Care Recommendations Oral care BID Other Recommendations --   CHL IP FOLLOW UP RECOMMENDATIONS 06/10/2017 Follow up Recommendations None   CHL IP FREQUENCY AND DURATION 06/10/2017 Speech Therapy Frequency (ACUTE ONLY) min 1 x/week Treatment Duration 1 week      CHL IP ORAL PHASE 06/10/2017 Oral Phase WFL Oral - Pudding Teaspoon -- Oral - Pudding Cup -- Oral - Honey Teaspoon -- Oral - Honey Cup -- Oral - Nectar Teaspoon WFL Oral - Nectar Cup -- Oral - Nectar Straw WFL Oral - Thin Teaspoon WFL Oral - Thin Cup WFL Oral - Thin Straw WFL Oral - Puree WFL Oral - Mech Soft -- Oral - Regular WFL Oral - Multi-Consistency -- Oral - Pill WFL Oral Phase - Comment --  CHL IP PHARYNGEAL PHASE 06/10/2017 Pharyngeal Phase Impaired Pharyngeal- Pudding Teaspoon -- Pharyngeal -- Pharyngeal- Pudding Cup -- Pharyngeal -- Pharyngeal- Honey Teaspoon -- Pharyngeal -- Pharyngeal- Honey Cup -- Pharyngeal -- Pharyngeal- Nectar Teaspoon Delayed swallow initiation-vallecula Pharyngeal -- Pharyngeal- Nectar Cup -- Pharyngeal -- Pharyngeal- Nectar Straw Delayed swallow initiation-vallecula Pharyngeal -- Pharyngeal- Thin Teaspoon Delayed swallow initiation-vallecula;WFL Pharyngeal -- Pharyngeal- Thin Cup WFL;Delayed swallow initiation-vallecula Pharyngeal -- Pharyngeal- Thin Straw Penetration/Aspiration before swallow;Delayed swallow initiation-vallecula;Delayed swallow initiation-pyriform sinuses Pharyngeal Material enters airway, CONTACTS cords and then ejected out Pharyngeal- Puree WFL Pharyngeal -- Pharyngeal- Mechanical Soft -- Pharyngeal -- Pharyngeal- Regular WFL Pharyngeal -- Pharyngeal- Multi-consistency -- Pharyngeal -- Pharyngeal- Pill WFL Pharyngeal -- Pharyngeal Comment trace silent laryngeal penetration of thin via straw to vocal cords x1 of 5 boluses   CHL IP CERVICAL ESOPHAGEAL PHASE 06/10/2017 Cervical Esophageal Phase Impaired Pudding Teaspoon -- Pudding Cup -- Honey Teaspoon -- Honey Cup -- Nectar  Teaspoon Reduced cricopharyngeal relaxation;Prominent cricopharyngeal segment Nectar Cup -- Nectar Straw Reduced cricopharyngeal relaxation;Prominent cricopharyngeal segment Thin Teaspoon Reduced cricopharyngeal relaxation;Prominent cricopharyngeal segment Thin Cup Reduced cricopharyngeal relaxation;Prominent cricopharyngeal segment Thin Straw Reduced cricopharyngeal relaxation;Prominent cricopharyngeal segment Puree Reduced cricopharyngeal relaxation;Prominent cricopharyngeal segment Mechanical Soft -- Regular Reduced cricopharyngeal relaxation;Prominent cricopharyngeal segment Multi-consistency -- Pill Prominent cricopharyngeal segment;Reduced cricopharyngeal relaxation Cervical Esophageal Comment appearance of possibly developing prominent cricopharyngeus that may be developing into Zenker's diverticulum,  No flowsheet data found. Macario Golds 06/10/2017, 9:35 AM    Luanna Salk, [facility] SLP [phone]  Medical Problem List and Plan: 1.  Decreased functional mobility secondary to left frontoparietal MCA infarction complicated by history of polymyositis             -admit to inpatient rehab 2.  DVT Prophylaxis/Anticoagulation: Subcutaneous Lovenox.  Monitor for any bleeding episodes 3. Pain Management: Neurontin 100 mg twice daily Tylenol as needed 4. Mood: Xanax 0.25 mg twice daily as needed 5. Neuropsych: This patient is capable of making decisions on her own behalf. 6. Skin/Wound Care: Routine skin checks 7. Fluids/Electrolytes/Nutrition: Routine in and outs with follow-up chemistries 8.  Polymyositis.  Continue CellCept 500 mg twice daily.  Patient has been wheelchair-bound for approximately the past 8 months 9.  Chronic diastolic congestive heart failure.  Monitor for any signs of fluid overload 10.  Hypertension.  Allow permissive hypertension.   Tenormin 50 mg daily, Lasix 40 mg daily prior to admission.  11.  Pulmonary nodules.  Incidental finding on CTA of neck.   Noncontrast CT 3 to 6 months 12.  Hyperlipidemia.  Lipitor  Post Admission Physician Evaluation: 1. Functional deficits secondary  to left frontoparietal MCA infarct in the setting polymyositis. 2. Patient is admitted to receive collaborative, interdisciplinary care between the physiatrist, rehab nursing staff, and therapy team. 3. Patient's level of medical complexity and substantial therapy needs in context of that medical necessity cannot be provided at a lesser intensity of care such as a SNF. 4. Patient has experienced substantial functional loss from his/her baseline which was documented above under the "Functional History" and "Functional Status" headings.  Judging by the patient's diagnosis, physical exam, and functional history, the patient has potential for functional progress which will result in measurable gains while on inpatient rehab.  These gains will be of substantial and practical use upon discharge  in facilitating mobility and self-care at the household level. 5. Physiatrist will provide 24 hour management of medical needs as well as oversight of the therapy plan/treatment and provide guidance as appropriate regarding the interaction of the two. 6. The Preadmission Screening has been reviewed and patient status is unchanged unless otherwise stated above. 7. 24 hour rehab nursing will assist with bladder management, bowel management, safety, skin/wound care, disease management, medication administration, pain management and patient education  and help integrate therapy concepts, techniques,education, etc. 8. PT will assess and treat for/with: Lower extremity strength, range of motion, stamina, balance, functional mobility, safety, adaptive techniques and equipment, NMR, family education .   Goals are: min to mod assist. 9. OT will assess and treat for/with: ADL's, functional mobility, safety, upper extremity strength, adaptive techniques and equipment, NMR, family education.   Goals  are: min to mod assist. Therapy may proceed with showering this patient. 10. SLP will assess and treat for/with: speech, cognition, communication, family ed.  Goals are: mod I to supervision. 11. Case Management and Social Worker will assess and treat for psychological issues and discharge planning. 12. Team conference will be held weekly to assess progress toward goals and to determine barriers to discharge. 13. Patient will receive at least 3 hours of therapy per day at least 5 days per week. 14. ELOS: 17-21 days       15. Prognosis:  excellent    I have personally performed a face to face diagnostic evaluation of this patient and formulated the key components of the plan.  Additionally, I have personally reviewed laboratory data, imaging studies, as well as relevant notes and concur with the physician assistant's documentation above.  Meredith Staggers,  MD, Mellody Drown   Lavon Paganini Angiulli, PA-C 06/12/2017

## 2017-06-12 NOTE — Evaluation (Addendum)
Physical Therapy Assessment and Plan  Patient Details  Name: [name] [name] MRN: [id number] Date of Birth: 05/19/1940  PT Diagnosis: Coordination disorder, Hemiplegia dominant and Muscle weakness Rehab Potential: Fair ELOS: 19-21 days   Today's Date: 06/13/2017 PT Individual Time: 1100-1200 AND 1300-1315 PT Individual Time Calculation (min): 60 min AND 15 min  Problem List:  Patient Active Problem List   Diagnosis Date Noted  . 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 Neurologic deficit due to acute ischemic stroke ([place])   . Hypokalemia   . Chronic diastolic congestive heart failure ([place])   . AVM (arteriovenous malformation)   . CVA (cerebral vascular accident) ([place]) 06/09/2017  . Multiple pulmonary nodules 06/09/2017  . Hypothermia 10/22/2016  . Chronic diastolic CHF (congestive heart failure) ([place]) 10/22/2016  . Abdominal pain, unspecified site 05/17/2013  . Nausea alone 05/17/2013  . 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 Abdominal pain, left lower quadrant 05/28/2011  . Cecal ulcer with hemorrhage 01/10/2011  . Acute blood loss anemia 12/06/2010  . Hematochezia 12/05/2010  . Anemia 12/05/2010  . Adrenal insufficiency ([place]) 12/05/2010  . Osteoporosis 12/05/2010  . LUNG INVOLVEMENT OTHER DISEASES CLASSIFIED ELSW 08/13/2009  . OTHER SPECIFIED DISORDER OF STOMACH AND DUODENUM 07/06/2008  . Essential hypertension 04/26/2007  . ALLERGIC RHINITIS 04/26/2007  . GASTROESOPHAGEAL REFLUX DISEASE 04/26/2007  . Polymyositis ([place]) 04/26/2007  . COLONIC POLYPS, ADENOMATOUS 02/06/2005    Past Medical History:  Past Medical History:  Diagnosis Date  . Allergic rhinitis, cause unspecified   . Benign neoplasm of colon   . Esophageal reflux   . Excessive daytime sleepiness   . 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 GI hemorrhage 2011   recurrent  . Hyperlipidemia   . Osteoporosis   . Polymyositis ([place])   . Unspecified  essential hypertension   . Vitamin D deficiency    Past Surgical History:  Past Surgical History:  Procedure Laterality Date  . ABDOMINAL HYSTERECTOMY     partial  . bilateral  rotator cuff surgery    . COLONOSCOPY  01/09/2011   others also  . COLONOSCOPY  05/29/2011   Procedure: COLONOSCOPY;  Surgeon: Gatha Mayer, MD;  Location: [facility] END[facility]COPY;  Service: Endoscopy;  Laterality: N/A;  Greggory Brandy Carlean Purl  . COLONOSCOPY WITH PROPOFOL N/A 02/16/2014   Procedure: COLONOSCOPY WITH PROPOFOL;  Surgeon: Milus Banister, MD;  Location: [facility] END[facility];  Service: Endoscopy;  Laterality: N/A;  . ESOPHAGOGASTRODUODENOSCOPY  12/08/2010   others also  . ESOPHAGOGASTRODUODENOSCOPY (EGD) WITH PROPOFOL N/A 02/16/2014   Procedure: ESOPHAGOGASTRODUODENOSCOPY (EGD) WITH PROPOFOL;  Surgeon: Milus Banister, MD;  Location: [facility];  Service: Endoscopy;  Laterality: N/A;  . EUS    . HOT HEMOSTASIS  05/29/2011   Procedure: HOT HEMOSTASIS (ARGON PLASMA COAGULATION/BICAP);  Surgeon: Gatha Mayer, MD;  Location: [facility];  Service: Endoscopy;  Laterality: N/A;  . LUMBAR LAMINECTOMY    . TONSILLECTOMY  age 21    Assessment & Plan Clinical Impression: Patient is a 77 year old right-handed female with history of hypertension, polymyositis, diastolic congestive heart failure, colonic AVM.  Presented to [facility] emergency room 06/09/2017 with complaints of generalized numbness and weakness.  At baseline patient is essentially wheelchair-bound and has chronic bilateral upper extremity weakness attributed to polymyositis.  Son would help with bed mobility and transfers.  One level apartment with level entry.  Son works during the day but would help his mother get up in the mornings and  get her to the power chair before leaving for work.  CT of head reviewed, showing anterior capsule infarction.  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  Subacute subcentimeter left  thalamus infarction as well as old right basal ganglia and old right thalamus and small infarcts.  MRI cervical spine negative.  Patient did not receive TPA.  CT angiogram of head and neck with no emergent large vessel occlusion.  Incidental findings of multiple right upper lobe pulmonary nodules and recommendations of CT of chest 3 to 6 months.  Echocardiogram with ejection fraction of 84% grade 1 diastolic dysfunction.  Currently maintained on aspirin and Plavix for CVA prophylaxis x3 months.  Subcutaneous Lovenox for DVT prophylaxis.  Patient developed upper respiratory infection.  Chest x-ray showed no evidence of active pulmonary disease.  Started on Augmentin 06/10/2017.  Tolerating a regular diet.  Physical and occupational therapy evaluations completed with recommendations of physical medicine rehab consult. Patient transferred to CIR on 06/12/2017 .   Patient currently requires total with mobility secondary to muscle weakness, decreased cardiorespiratoy endurance, decreased coordination and decreased motor planning and decreased sitting balance, decreased postural control, hemiplegia and decreased balance strategies.  Prior to hospitalization, patient was mod with bed mobility and transfers, and modified independent w/ power w/c mobility. Pt lived with Son in a [place] home.  Home access is  Level entry.  Patient will benefit from skilled PT intervention to maximize safe functional mobility, minimize fall risk and decrease caregiver burden for planned discharge home with 24 hour assist.  Anticipate patient will benefit from follow up [facility] at discharge.  PT - End of Session Activity Tolerance: Tolerates 10 - 20 min activity with multiple rests Endurance Deficit: Yes Endurance Deficit Description: decreased PT Assessment Rehab Potential (ACUTE/IP ONLY): Fair PT Barriers to Discharge: Decreased caregiver support PT Barriers to Discharge Comments: son works during the day, may not be able to provide  enough physical assistance at d/c as well PT Patient demonstrates impairments in the following area(s): Balance;Behavior;Endurance;Motor;Safety PT Transfers Functional Problem(s): Bed Mobility;Bed to Chair;Car;Furniture;Floor PT Locomotion Functional Problem(s): Wheelchair Mobility;Ambulation;Stairs PT Plan PT Intensity: Minimum of 1-2 x/day ,45 to 90 minutes PT Frequency: 5 out of 7 days PT Duration Estimated Length of Stay: 19-21 days PT Treatment/Interventions: Visual/perceptual remediation/compensation;Pain management;Disease management/prevention;Balance/vestibular training;Ambulation/gait training;DME/adaptive equipment instruction;Patient/family education;Therapeutic Activities;Wheelchair propulsion/positioning;Therapeutic Exercise;Psychosocial support;Cognitive remediation/compensation;Functional electrical stimulation;Community reintegration;Functional mobility training;Skin care/wound management;UE/LE Strength taining/ROM;UE/LE Coordination activities;Splinting/orthotics;Neuromuscular re-education;Discharge planning PT Transfers Anticipated Outcome(s): Mod assist PT Locomotion Anticipated Outcome(s): Modified independent at w/c level PT Recommendation Follow Up Recommendations: Home health PT Patient destination: Home Equipment Recommended: To be determined Equipment Details: Has power chair, may need new one in light of recent stroke  Skilled Therapeutic Intervention  Session 1: Pt in supine and agreeable to therapy, denies pain. Pt instructed patient in PT Evaluation and initiated treatment intervention; see below for results. Pt educated patient in [place], rehab potential, rehab goals, and discharge recommendations. Discussed benefits of TIS w/c while pt is admitted to rehab including pressure relief, positioning, and tolerance to OOB activity. Pt in agreement. Returned to room and ended session in TIS w/c, call bell within reach and all needs met.   Session 2 addendum: Therapist  presented to room w/ NT to assist w/ placement of maximove sling to return to bed for toileting on bedpan. Pt found on toilet in bathroom, son and other family members are present. Son had transferred pt to toilet himself as he didn't feel staff were  coming to room fast enough. Educated all family members of importance of waiting for staff to perform transfers 2/2 safety in light of recent stroke. All verbalized understanding and in agreement. While therapist was present, son showed how he transfers patient at home when returning to w/c. Son performed total assist stand/squat pivot by lifting pt underarms. Therapist providing min guard hands-on for safety. Son w/ poor body mechanics and lifting pt under RUE that has been subluxed per medical record. Son reports pt feels about the same as how she felt w/ transfers prior to stroke. Additionally educated family on importance of safe body mechanics as well and reinforced importance of staff performing transfers because of skilled body mechanics required. Again, family verbalized understanding and in agreement. Also of note, son appears to be blind although no family members or son endorsed this. Other family member set up chair for transfer, assisted pt w/ pericare, and guided son to bathroom/pt via hand-over-hand assist.   PT Evaluation Precautions/Restrictions Precautions Precautions: Fall Type of Shoulder Precautions: R shoulder with subluxation most recent stroke Restrictions Weight Bearing Restrictions: No Pain Pain Assessment Pain Scale: 0-10 Pain Score: 0-No pain Home Living/Prior Functioning Home Living Available Help at Discharge: Family Type of Home: Apartment Home Access: Level entry Home Layout: One level Bathroom Shower/Tub: Chiropodist: Standard Bathroom Accessibility: Yes Additional Comments: Accessible via power chair  Lives With: Son Prior Function Level of Independence: Requires assistive device for  independence;Needs assistance with homemaking;Needs assistance with tranfers;Needs assistance with gait(pt reports she has not walked in 7 years, uses power chair for independence)  Able to Take Stairs?: No Driving: No Vocation: Unemployed Comments: Son helps pt get OOB in morning and up to power chair, then he would leave for work. When asked what pt would do if she needed to use restroom, she reports she never had to. Vision/Perception     Cognition Overall Cognitive Status: No family/caregiver present to determine baseline cognitive functioning Arousal/Alertness: Awake/alert Orientation Level: Oriented X4 Safety/Judgment: Appears intact Sensation Sensation Light Touch: Appears Intact Proprioception: Appears Intact Motor  Motor Motor: Tetraplegia;Other (comment) Motor - Skilled Clinical Observations: BLE weakness, RUE weakness, generalized deconditioning  Mobility Bed Mobility Bed Mobility: Rolling Right;Rolling Left;Supine to Sit;Sit to Supine Rolling Right: 2: Max assist Rolling Right Details: Manual facilitation for placement;Manual facilitation for weight bearing;Manual facilitation for weight shifting;Verbal cues for technique;Verbal cues for precautions/safety;Tactile cues for initiation Rolling Left: 2: Max assist Supine to Sit: 2: Max assist Supine to Sit Details: Verbal cues for precautions/safety;Manual facilitation for placement;Manual facilitation for weight shifting;Manual facilitation for weight bearing;Verbal cues for technique;Tactile cues for initiation Sit to Supine: 2: Max assist Sit to Supine - Details: Tactile cues for initiation;Manual facilitation for weight bearing;Verbal cues for technique;Manual facilitation for weight shifting;Verbal cues for precautions/safety;Manual facilitation for placement Transfers Transfers: Yes Lateral/Scoot Transfers: 1: +2 Total assist;With slide board Lateral/Scoot Transfer Details: Manual facilitation for weight  shifting;Verbal cues for technique;Manual facilitation for placement;Verbal cues for precautions/safety;Tactile cues for initiation;Manual facilitation for weight bearing Locomotion  Ambulation Ambulation: No Gait Gait: No Stairs / Additional Locomotion Stairs: No Wheelchair Mobility Wheelchair Mobility: No  Trunk/Postural Assessment  Cervical Assessment Cervical Assessment: Exceptions to WFL(forward head, rounded shoulder posture) Thoracic Assessment Thoracic Assessment: Within Functional Limits Lumbar Assessment Lumbar Assessment: Exceptions to WFL(posterior pelvic tilt) Postural Control Postural Control: Deficits on evaluation(delayed)  Balance Balance Balance Assessed: Yes Static Sitting Balance Static Sitting - Balance Support: No upper extremity supported;Feet supported Static Sitting - Level of Assistance:  5: Stand by assistance Dynamic Sitting Balance Dynamic Sitting - Balance Support: No upper extremity supported;Feet supported Dynamic Sitting - Level of Assistance: 4: Min assist Extremity Assessment      RLE Assessment RLE Assessment: Exceptions to WFL(3- to 3/5 globally) LLE Assessment LLE Assessment: Exceptions to WFL(3- to 3/5 globally)   See Function Navigator for Current Functional Status.   Refer to Care Plan for Long Term Goals  Recommendations for other services: None   Discharge Criteria: Patient will be discharged from PT if patient refuses treatment 3 consecutive times without medical reason, if treatment goals not met, if there is a change in medical status, if patient makes no progress towards goals or if patient is discharged from hospital.  The above assessment, treatment plan, treatment alternatives and goals were discussed and mutually agreed upon: by patient  Eva Griffo K Arnette 06/13/2017, 12:29 PM

## 2017-06-12 NOTE — Progress Notes (Signed)
PMR Admission Coordinator Pre-Admission Assessment   Patient: [name] [name] is an 77 y.o., female MRN: [id number] DOB: 09/14/1940 Height: 5\' 3"  (160 cm) Weight: 77.1 kg (170 lb)                                                                                                                                                  Insurance Information HMO: Yes    PPO:      PCP:      IPA:      80/20:      OTHER: AARP MCR Complete Plan 1 PRIMARY: UHC Medicare      Policy#: [id number]      Subscriber: Patient CM Name: Vevelyn Royals      Phone#: [phone]     Fax#: [phone]  Update CM on day 7 (admit date 5/31: fax clinical updates on 6/7) Pre-Cert#: [id number]      Employer:  Benefits:  Phone #: NA     Name: Checked through [email or web address] Eff. Date: 04/13/2017     Deduct: $0      Out of Pocket Max: $4,400      Life Max: NA CIR: $345/day for days 1-5; $0/day for days 6+      SNF: $0/day for days 1-20; $160/day for days 21-48; $0/day for days 49-100. 100 day SNF limit Outpatient: as necessary     Co-Pay: $40/visit Home Health: as necessary (100% covered)     Co-Pay: $0 DME: 80% covered     Co-Pay: 20% Providers: In network   Medicaid Application Date:       Case Manager:  Disability Application Date:       Case Worker:    Emergency Darwin     Name Relation Home Work Mobile    Briceno,Sandrekia Daughter [phone]           Current Medical History  Patient Admitting Diagnosis: Infarct Left Frontoparietal MCA History of Present Illness: [name] [name] is a 77 year old right-handed female with history of hypertension, polymyositis, diastolic congestive heart failure, colonic AVM.  Presented to [facility] emergency room 06/09/2017 with complaints of generalized numbness and weakness.  At baseline patient is essentially wheelchair-bound and has chronic bilateral upper extremity weakness attributed to polymyositis. CT of head reviewed, showing anterior capsule  infarction.  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  Subacute subcentimeter left thalamus infarction as well as old right basal ganglia and old right thalamus and small infarcts.  MRI cervical spine negative.  Patient did not receive TPA.  CT angiogram of head and neck with no emergent large vessel occlusion.  Incidental findings of multiple right upper lobe pulmonary nodules and recommendations of CT of chest 3 to 6 months.  Echocardiogram with ejection fraction of 65% grade 1  diastolic dysfunction.  Currently maintained on aspirin and Plavix for CVA prophylaxis x3 months.  Subcutaneous Lovenox for DVT prophylaxis.  Patient developed upper respiratory infection.  Chest x-ray showed no evidence of active pulmonary disease.  Started on Augmentin 06/10/2017.  Tolerating a regular diet.  Physical and occupational therapy evaluations completed with recommendations of physical medicine rehab consult.  Patient is to be admitted for a comprehensive rehab program on 06/12/17.   Total: 10   Past Medical History      Past Medical History:  Diagnosis Date  . Allergic rhinitis, cause unspecified    . Benign neoplasm of colon    . Esophageal reflux    . Excessive daytime sleepiness    . 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 GI hemorrhage 2011    recurrent  . Hyperlipidemia    . Osteoporosis    . Polymyositis ([place])    . Unspecified essential hypertension    . Vitamin D deficiency        Family History  family history includes Dementia in her mother; Hypertension in her father; Malignant hyperthermia in her father.   Prior Rehab/Hospitalizations:  Has the patient had major surgery during 100 days prior to admission? No   Current Medications    Current Facility-Administered Medications:  .  acetaminophen (TYLENOL) tablet 650 mg, 650 mg, Oral, Q4H PRN **OR** acetaminophen (TYLENOL) solution 650 mg, 650 mg, Per Tube, Q4H PRN **OR**  acetaminophen (TYLENOL) suppository 650 mg, 650 mg, Rectal, Q4H PRN, Emokpae, Ejiroghene E, MD .  ALPRAZolam (XANAX) tablet 0.25 mg, 0.25 mg, Oral, BID PRN, Emokpae, Ejiroghene E, MD .  amoxicillin-clavulanate (AUGMENTIN) 875-125 MG per tablet 1 tablet, 1 tablet, Oral, Q12H, Emokpae, Ejiroghene E, MD, 1 tablet at 06/12/17 1044 .  aspirin chewable tablet 81 mg, 81 mg, Oral, Daily, Emokpae, Ejiroghene E, MD, 81 mg at 06/12/17 1047 .  atorvastatin (LIPITOR) tablet 10 mg, 10 mg, Oral, q1800, Caren Griffins, MD, 10 mg at 06/11/17 1751 .  calcium carbonate (OS-CAL - dosed in mg of elemental calcium) tablet 1,000 mg of elemental calcium, 2 tablet, Oral, Daily, Emokpae, Ejiroghene E, MD, 1,000 mg of elemental calcium at 06/12/17 1046 .  clopidogrel (PLAVIX) tablet 75 mg, 75 mg, Oral, Daily, Garvin Fila, MD, 75 mg at 06/12/17 1047 .  enoxaparin (LOVENOX) injection 40 mg, 40 mg, Subcutaneous, Q24H, Emokpae, Ejiroghene E, MD, 40 mg at 06/12/17 0409 .  febuxostat (ULORIC) tablet 40 mg, 40 mg, Oral, Daily, Emokpae, Ejiroghene E, MD, 40 mg at 06/12/17 1045 .  feeding supplement (ENSURE ENLIVE) (ENSURE ENLIVE) liquid 237 mL, 237 mL, Oral, BID BM, Gherghe, Costin M, MD, 237 mL at 06/11/17 1430 .  fluticasone (FLONASE) 50 MCG/ACT nasal spray 1 spray, 1 spray, Each Nare, Daily PRN, Emokpae, Ejiroghene E, MD, 1 spray at 06/11/17 0906 .  heparin lock flush 100 unit/mL, 500 Units, Intracatheter, Q30 days, 500 Units at 06/10/17 0519 **AND** heparin lock flush 100 unit/mL, 500 Units, Intracatheter, PRN, Hennie Duos, MD .  hydrALAZINE (APRESOLINE) injection 10 mg, 10 mg, Intravenous, Q4H PRN, Emokpae, Ejiroghene E, MD .  mycophenolate (CELLCEPT) capsule 500 mg, 500 mg, Oral, BID, Emokpae, Ejiroghene E, MD, 500 mg at 06/12/17 1044 .  ondansetron (ZOFRAN) injection 4 mg, 4 mg, Intravenous, Q8H PRN, Caren Griffins, MD, 4 mg at 06/11/17 0919 .  pantoprazole (PROTONIX) EC tablet 40 mg, 40 mg, Oral, Daily, Emokpae,  Ejiroghene E, MD, 40 mg at 06/12/17 1044 .  senna-docusate (Senokot-S) tablet 1 tablet, 1 tablet, Oral, QHS PRN, Emokpae, Ejiroghene E, MD   Patients Current Diet:       Diet Order              DIET DYS 3 Room service appropriate? Yes; Fluid consistency: Thin  Diet effective now             Precautions / Restrictions Precautions Precautions: Shoulder, Fall Type of Shoulder Precautions: L shoulder with subluxation Precaution Comments: good carryover of precautions for RUE from earlier PT session Restrictions Weight Bearing Restrictions: No    Has the patient had 2 or more falls or a fall with injury in the past year?No   Prior Activity Level Community (5-7x/wk): pt uses [employer] bus and gets out daily with use of Dispensing optician / Equipment Home Assistive Devices/Equipment: Chana Bode (specify type), Wheelchair Home Equipment: Wheelchair - power, Adaptive equipment, Grab bars - toilet   Prior Device Use: Indicate devices/aids used by the patient prior to current illness, exacerbation or injury? Motorized wheelchair or scooter   Prior Functional Level Prior Function Level of Independence: Needs assistance Gait / Transfers Assistance Needed: son helps with bed mobility and transfers ADL's / Homemaking Assistance Needed: pan bathes per pt, family assist with home mgt   Self Care: Did the patient need help bathing, dressing, using the toilet or eating?  Needed some help (set up for sponge bathing; clothing management for toileting; set up for eating).    Indoor Mobility: Did the patient need assistance with walking from room to room (with or without device)? Dependent  Use of powerchair at baseline   Stairs: Did the patient need assistance with internal or external stairs (with or without device)? Dependent   Functional Cognition: Did the patient need help planning regular tasks such as shopping or remembering to take medications? Independent   Current  Functional Level Cognition   Arousal/Alertness: Awake/alert Overall Cognitive Status: Within Functional Limits for tasks assessed Orientation Level: Oriented X4 Attention: Selective Selective Attention: Appears intact Memory: Appears intact Awareness: Appears intact Problem Solving: Appears intact    Extremity Assessment (includes Sensation/Coordination)   Upper Extremity Assessment: RUE deficits/detail, LUE deficits/detail, Generalized weakness RUE Deficits / Details: zero AROM, flaccid tone RUE Coordination: decreased fine motor, decreased gross motor LUE Deficits / Details: L shoulder subluxation, AROM impaired in all planes LUE Coordination: decreased fine motor, decreased gross motor  Lower Extremity Assessment: Defer to PT evaluation RLE Deficits / Details: noted assymterical weakness R weaker than Left <3/5 gross motions RLE Coordination: decreased fine motor, decreased gross motor LLE Deficits / Details: generalized weakness noted  LLE Coordination: decreased fine motor, decreased gross motor     ADLs   Overall ADL's : Needs assistance/impaired Eating/Feeding: Sitting, Total assistance Eating/Feeding Details (indicate cue type and reason): pt unable to feed self due to impaired grip strength and impaired AROM of B UES Grooming: Wash/dry face, Maximal assistance, Bed level Grooming Details (indicate cue type and reason): HOH Upper Body Bathing: Total assistance Lower Body Bathing: Total assistance Upper Body Dressing : Total assistance Lower Body Dressing: Total assistance Toilet Transfer: +2 for physical assistance, +2 for safety/equipment, Maximal assistance Toilet Transfer Details (indicate cue type and reason): pt transeferred with max A + 2 lateral scoot to drop arm recliner from sitting EOB Toileting- Clothing Manipulation and Hygiene: Total assistance Functional mobility during ADLs: Total assistance, +2 for physical assistance, +2 for safety/equipment General ADL  Comments: Poor dynamic sitting balance,  Fair static sitting balance sitting EOB     Mobility   Overal bed mobility: Needs Assistance Bed Mobility: Rolling Rolling: Min assist, Max assist Supine to sit: Mod assist, +2 for physical assistance General bed mobility comments: min assist to roll to right using bed rail, max assist to roll to L side, bed pad used to complete turn; vc for sequencing and using BLE bent at knees for technique     Transfers   Overall transfer level: Needs assistance Transfers: Lateral/Scoot Transfers  Lateral/Scoot Transfers: Max assist, +2 physical assistance General transfer comment: Pt declined transfer at this time     Ambulation / Gait / Stairs / Emergency planning/management officer   Ambulation/Gait General Gait Details: non ambulatory     Posture / Balance Dynamic Sitting Balance Sitting balance - Comments: able to sit EOB unsupported but demonstrates a left bias at times Balance Overall balance assessment: Needs assistance Sitting-balance support: Feet supported Sitting balance-Leahy Scale: Fair Sitting balance - Comments: able to sit EOB unsupported but demonstrates a left bias at times Postural control: Left lateral lean(varies)     Special needs/care consideration BiPAP/CPAP: No CPM:No Continuous Drip IV: No Dialysis: No        Days: No Life Vest: No Oxygen: No Special Bed: No Trach Size: No Wound Vac (area): No      Location: No Skin: nothing of consideration                               Location: NA Bowel mgmt:06/09/17 Bladder mgmt:continent  Diabetic mgmt: NA        Previous Home Environment Living Arrangements: Children, Other relatives  Lives With: Son Available Help at Discharge: Family Type of Home: Apartment Home Layout: One level Home Access: Level entry Bathroom Shower/Tub: Chiropodist: Standard Bathroom Accessibility: Yes Home Care Services: Yes Type of Home Care Services: [place] (if known):  unknown   Discharge Living Setting Plans for Discharge Living Setting: Patient's home, Lives with (comment)(lives with son) Type of Home at Discharge: Apartment Discharge Home Layout: One level, Other (Comment)(handicapped accessible) Discharge Home Access: Level entry Discharge Bathroom Shower/Tub: Tub/shower unit(pt only sponge bathes. ) Discharge Bathroom Toilet: Standard Discharge Bathroom Accessibility: Yes How Accessible: Accessible via wheelchair Does the patient have any problems obtaining your medications?: No   Social/Family/Support Systems Patient Roles: Parent Contact Information: (son: Carrolyn Leigh [phone]; daugther: Hal Morales [phone]) Anticipated Caregiver: son and daugther Anticipated Caregiver's Contact Information: see above Ability/Limitations of Caregiver: son: currently works 3days/week; daughter can provide Min A (son performing Mod/Max transfers at [facility]) Caregiver Availability: 24/7(son and daugther adament they can provide assistance needed)  Discharge Plan Discussed with Primary Caregiver: Yes(son and daughter in agreement with plan); Aware of recommended level of Assist at DC; reports they will do anything at DC to give her that support; son indicating he has word from [facility] agency regarding additional aide support. Son also said he can take a leave of absence from work if need be. Is Caregiver In Agreement with Plan?: Yes Does Caregiver/Family have Issues with Lodging/Transportation while Pt is in Rehab?: No     Goals/Additional Needs Patient/Family Goal for Rehab: PT-Min to Mod A; OT-Min to Mod A; SLF-Mod I/Supervision  Expected length of stay: 17-20 days Cultural Considerations: Christian  Dietary Needs: DYS 3; thin liquids; ground meats:extra gravy/sauce(no tomatoes; light salt diet) Equipment Needs: TBD Special Service Needs: NA Additional Information: NA Pt/Family Agrees to  Admission and willing to participate: Yes Program Orientation Provided &  Reviewed with Pt/Caregiver Including Roles  & Responsibilities: Yes(pt, son, and daughter) Additional Information Needs: Son says he has heard from [facility] agency he can get up to 36 hours of care? Will need to check into that Information Needs to be Provided By: SW  Barriers to Discharge: Lack of/limited family support     Decrease burden of Care through IP rehab admission: Decrease number of caregivers     Possible need for SNF placement upon discharge: Not anticipated      Patient Condition: This patient's condition remains as documented in the consult dated 06/10/17, in which the Rehabilitation Physician determined and documented that the patient's condition is appropriate for intensive rehabilitative care in an inpatient rehabilitation facility. Will admit to inpatient rehab today.   Preadmission Screen Completed By:  Jhonnie Garner, 06/12/2017 11:22 AM ______________________________________________________________________   Discussed status with Dr. Naaman Plummer on 06/11/17 at 11:16AM and received telephone approval for admission today.   Admission Coordinator:  Jhonnie Garner, time 11/16AM Sudie Grumbling: 06/12/17               Cosigned by: Meredith Staggers, MD at 06/12/2017 11:28 AM  Revision History

## 2017-06-12 NOTE — Progress Notes (Signed)
Patient arrived to the unit by nursing staff. Patient is in room comfortable and eating dinner

## 2017-06-12 NOTE — Progress Notes (Signed)
Inpatient Rehabilitation-Admissions Coordinator   [facility] received insurance authorization for CIR. AC received medical approval and bed available. Pt will be admitted to CIR today. CM and floor RN aware of plans. Call if questions.   Jhonnie Garner, OTR/L  Rehab Admissions Coordinator  [phone] 06/12/2017 10:54 AM

## 2017-06-12 NOTE — Progress Notes (Signed)
Physical Medicine and Rehabilitation Consult Reason for Consult: Decreased functional mobility Referring Physician: [facility]     HPI: [name] [name] is a 77 y.o. right-handed female with history of hypertension, polymyositis, diastolic congestive heart failure, colonic AVM.  Presented to [facility] emergency room 06/09/2017 with complaints of generalized numbness and weakness.  At baseline patient is essentially wheelchair-bound and has chronic bilateral upper extremity weakness attributable to mild polymyositis.  Son would help with bed mobility and transfers.  One level apartment with level entry.  Son works during the day but would help his mother get up in the mornings and get to her power chair before leaving for work.  CT head reviewed, showing anterior capsule infarct.  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  Subacute subcentimeter left thalamus infarction as well as old right basal ganglia and old right thalamus and small infarcts.  MRI cervical spine with no canal stenosis.  Patient did not receive TPA.  CT angiogram of head and neck with no emergent large vessel occlusion.  Incidental findings of multiple right upper lobe pulmonary nodules with recommendations of chest CT 3 to 6 months recommended.  Echocardiogram is pending.  Presently on aspirin and Plavix for CVA prophylaxis.  Subcutaneous Lovenox for DVT prophylaxis.  Tolerating a regular diet.  Physical therapy evaluation completed with recommendations of physical medicine rehab consult.     Review of Systems  Constitutional: Negative for chills and fever.  HENT: Negative for hearing loss.   Eyes: Negative for blurred vision and double vision.  Respiratory: Negative for cough and shortness of breath.   Cardiovascular: Positive for leg swelling. Negative for chest pain and palpitations.  Gastrointestinal:       GERD  Skin: Negative for rash.  Neurological: Positive for sensory  change and weakness.  Psychiatric/Behavioral: The patient has insomnia.   All other systems reviewed and are negative.   Past Medical History:  Diagnosis Date  . Allergic rhinitis, cause unspecified    . Benign neoplasm of colon    . Esophageal reflux    . Excessive daytime sleepiness    . 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 GI hemorrhage 2011    recurrent  . Hyperlipidemia    . Osteoporosis    . Polymyositis ([place])    . Unspecified essential hypertension    . Vitamin D deficiency           Past Surgical History:  Procedure Laterality Date  . ABDOMINAL HYSTERECTOMY        partial  . bilateral  rotator cuff surgery      . COLONOSCOPY   01/09/2011    others also  . COLONOSCOPY   05/29/2011    Procedure: COLONOSCOPY;  Surgeon: Gatha Mayer, MD;  Location: [facility] ENDOSCOPY;  Service: Endoscopy;  Laterality: N/A;  Greggory Brandy Carlean Purl  . COLONOSCOPY WITH PROPOFOL N/A 02/16/2014    Procedure: COLONOSCOPY WITH PROPOFOL;  Surgeon: Milus Banister, MD;  Location: [facility] ENDOSCOPY;  Service: Endoscopy;  Laterality: N/A;  . ESOPHAGOGASTRODUODENOSCOPY   12/08/2010    others also  . ESOPHAGOGASTRODUODENOSCOPY (EGD) WITH PROPOFOL N/A 02/16/2014    Procedure: ESOPHAGOGASTRODUODENOSCOPY (EGD) WITH PROPOFOL;  Surgeon: Milus Banister, MD;  Location: [facility] ENDOSCOPY;  Service: Endoscopy;  Laterality: N/A;  . EUS      . HOT HEMOSTASIS   05/29/2011    Procedure: HOT HEMOSTASIS (ARGON PLASMA COAGULATION/BICAP);  Surgeon: Gatha Mayer, MD;  Location: [facility];  Service: Endoscopy;  Laterality: N/A;  . LUMBAR LAMINECTOMY      . TONSILLECTOMY   age 3         Family History  Problem Relation Age of Onset  . Dementia Mother    . Hypertension Father    . Malignant hyperthermia Father    . Colon cancer Neg Hx      Social History:  reports that she has never smoked. She has quit using smokeless tobacco. Her smokeless tobacco use included snuff. She reports that she does not drink alcohol or use  drugs. Allergies:  Allergies  Allergen Reactions  . Codeine Other (See Comments)      Feels drunk  . Hydrocodone        "makes me sleepy"  . Tramadol Hcl Other (See Comments)      Makes me feel drunk          Medications Prior to Admission  Medication Sig Dispense Refill  . allopurinol (ZYLOPRIM) 300 MG tablet Take 300 mg by mouth daily.   10  . Ascorbic Acid (VITAMIN C) 1000 MG tablet Take 1,000 mg by mouth daily.       Marland Kitchen atenolol (TENORMIN) 50 MG tablet Take 50 mg by mouth daily.        . Calcium Carbonate (CALTRATE 600) 1500 MG TABS Take 2 tablets by mouth daily.       . Cholecalciferol (VITAMIN D PO) Take 2 tablets by mouth daily.       . DUREZOL 0.05 % EMUL Place 1 drop into both eyes daily.      Marland Kitchen esomeprazole (NEXIUM) 40 MG capsule Take one capsule by thirty minutes before breakfast Take one capsule by thirty minutes before dinner (Patient taking differently: Take 40 mg by mouth 2 (two) times daily before a meal. Take one capsule by thirty minutes before breakfast Take one capsule by thirty minutes before dinner) 60 capsule 3  . folic acid (FOLVITE) 025 MCG tablet Take 400 mcg by mouth daily.        . furosemide (LASIX) 40 MG tablet Take 40 mg by mouth daily.        . mycophenolate (CELLCEPT) 500 MG tablet 500 mg TWICE DAILY   1  . omega-3 acid ethyl esters (LOVAZA) 1 g capsule Take 2 g by mouth 2 (two) times daily.    10  . OVER THE COUNTER MEDICATION Take 1 capsule by mouth daily. Beet Root      . PAPAYA PO Take 1 tablet by mouth daily.      . potassium chloride SA (K-DUR,KLOR-CON) 20 MEQ tablet Take 2 tablets (40 mEq total) by mouth 2 (two) times daily. 20 tablet 0  . predniSONE (DELTASONE) 20 MG tablet TK 1 T PO ONCE D FOR 6 DAYS   0  . PROLENSA 0.07 % SOLN Place 1 drop into both eyes daily. As directed      . psyllium (HYDROCIL/METAMUCIL) 95 % PACK Take 1 packet by mouth daily. (Patient taking differently: Take 1 packet by mouth daily as needed (gas). ) 56 each 6  .  ULORIC 40 MG tablet Take 40 mg by mouth daily.      Marland Kitchen VITAMIN A PO Take 1 tablet by mouth daily.      . vitamin B-12 (CYANOCOBALAMIN) 1000 MCG tablet Take 1,000 mcg by mouth daily.        Marland Kitchen VITAMIN E PO Take 1 tablet by mouth.      Marland Kitchen  acetaminophen (TYLENOL 8 HOUR) 650 MG CR tablet Take 1 tablet (650 mg total) by mouth every 8 (eight) hours as needed. (Patient taking differently: Take 650 mg by mouth every 8 (eight) hours as needed for pain. ) 30 tablet 0  . ALPRAZolam (XANAX) 0.25 MG tablet Take 0.25 mg by mouth 2 (two) times daily as needed for anxiety or sleep.       . benzonatate (TESSALON) 100 MG capsule TK 1 C PO TID PRN COUGH   0  . CARAFATE 1 GM/10ML suspension Take 1 g by mouth 2 (two) times daily as needed (GAS/INDIGESTION).       Marland Kitchen colchicine 0.6 MG tablet Take 0.6 mg by mouth. Take 1 tablet by mouth every 8 hours as needed for gout flare   5  . fluticasone (FLONASE) 50 MCG/ACT nasal spray Place 1 spray into both nostrils daily as needed for allergies.       Marland Kitchen gabapentin (NEURONTIN) 100 MG capsule TAKE ONE CAPSULE BY MOUTH TWICE DAILY (Patient taking differently: TAKE ONE CAPSULE BY MOUTH TWICE DAILY AS NEEDED FOR NERVE PAIN) 30 capsule 0  . loratadine (CLARITIN) 10 MG tablet Take 10 mg by mouth daily as needed. For allergies.      . sodium chloride (OCEAN) 0.65 % nasal spray Place 1 spray into the nose at bedtime as needed for congestion (allergies).           Home: Home Living Family/patient expects to be discharged to:: Private residence Living Arrangements: Children, Other relatives Available Help at Discharge: Family Type of Home: Apartment Home Access: Level entry Home Layout: One level Bathroom Shower/Tub: Chiropodist: Standard Bathroom Accessibility: Yes Home Equipment: Wheelchair - power  Functional History: Prior Function Level of Independence: Needs assistance Gait / Transfers Assistance Needed: son helps with bed mobility and transfers ADL's /  Homemaking Assistance Needed: pan bathes per pt, family assist with home mgt Functional Status:  Mobility: Bed Mobility Overal bed mobility: Needs Assistance Bed Mobility: Supine to Sit Supine to sit: Mod assist, +2 for physical assistance General bed mobility comments: +2 moderate assist to move LEs to EOB and elevate trunk to upright, use of chuck pad to rotate hips to EOB Transfers Overall transfer level: Needs assistance Transfers: Lateral/Scoot Transfers  Lateral/Scoot Transfers: Max assist, +2 physical assistance General transfer comment: +2 max assist to peform lateral scoot to left in 3 incremental movements using gait belt and chuck pad Ambulation/Gait General Gait Details: non ambulatory   ADL:   Cognition: Cognition Overall Cognitive Status: No family/caregiver present to determine baseline cognitive functioning Orientation Level: Oriented X4 Cognition Arousal/Alertness: Awake/alert Behavior During Therapy: WFL for tasks assessed/performed Overall Cognitive Status: No family/caregiver present to determine baseline cognitive functioning   Blood pressure 135/74, pulse 73, temperature 97.7 F (36.5 C), temperature source Oral, resp. rate 18, height 5\' 3"  (1.6 m), weight 77.1 kg (170 lb), SpO2 99 %. Physical Exam  Vitals reviewed. Constitutional: She is oriented to person, place, and time. She appears well-developed and well-nourished.  HENT:  Head: Normocephalic and atraumatic.  Eyes: EOM are normal. Right eye exhibits no discharge. Left eye exhibits no discharge.  Neck: Normal range of motion. Neck supple. No thyromegaly present.  Cardiovascular: Normal rate and regular rhythm.  Respiratory: Effort normal and breath sounds normal. No respiratory distress.  GI: Soft. Bowel sounds are normal. She exhibits no distension.  Musculoskeletal:  No edema or tenderness in extremities  Neurological: She is alert and oriented to person, place,  and time.  Follows  commands Dysarthria  Facial weakness Motor: Right upper extremity: Shoulder abduction, elbow flexion/extension 2/5, hand 3/5 grip Left lower extremity Left upper extremity: Shoulder abduction, elbow flexion/extension 2+/5, handgrip 4/5  Bilateral lower extremities: Proximally 2/5, distally 3/5 (right weaker than left) Sensation intact light touch  Skin: Skin is warm and dry.  Psychiatric: She has a normal mood and affect. Her behavior is normal.      Lab Results Last 24 Hours       Results for orders placed or performed during the hospital encounter of 06/09/17 (from the past 24 hour(s))  Protime-INR     Status: None    Collection Time: 06/09/17  4:48 PM  Result Value Ref Range    Prothrombin Time 13.2 11.4 - 15.2 seconds    INR 1.01    APTT     Status: Abnormal    Collection Time: 06/09/17  4:48 PM  Result Value Ref Range    aPTT 95 (H) 24 - 36 seconds  CBC     Status: Abnormal    Collection Time: 06/09/17  4:48 PM  Result Value Ref Range    WBC 7.4 4.0 - 10.5 K/uL    RBC 3.72 (L) 3.87 - 5.11 MIL/uL    Hemoglobin 9.6 (L) 12.0 - 15.0 g/dL    HCT 31.2 (L) 36.0 - 46.0 %    MCV 83.9 78.0 - 100.0 fL    MCH 25.8 (L) 26.0 - 34.0 pg    MCHC 30.8 30.0 - 36.0 g/dL    RDW 17.8 (H) 11.5 - 15.5 %    Platelets 296 150 - 400 K/uL  Differential     Status: None    Collection Time: 06/09/17  4:48 PM  Result Value Ref Range    Neutrophils Relative % 55 %    Neutro Abs 4.2 1.7 - 7.7 K/uL    Lymphocytes Relative 33 %    Lymphs Abs 2.4 0.7 - 4.0 K/uL    Monocytes Relative 10 %    Monocytes Absolute 0.7 0.1 - 1.0 K/uL    Eosinophils Relative 2 %    Eosinophils Absolute 0.1 0.0 - 0.7 K/uL    Basophils Relative 0 %    Basophils Absolute 0.0 0.0 - 0.1 K/uL  Comprehensive metabolic panel     Status: Abnormal    Collection Time: 06/09/17  4:48 PM  Result Value Ref Range    Sodium 140 135 - 145 mmol/L    Potassium 3.6 3.5 - 5.1 mmol/L    Chloride 104 101 - 111 mmol/L    CO2 25 22 - 32  mmol/L    Glucose, Bld 84 65 - 99 mg/dL    BUN 12 6 - 20 mg/dL    Creatinine, Ser 0.42 (L) 0.44 - 1.00 mg/dL    Calcium 9.2 8.9 - 10.3 mg/dL    Total Protein 6.2 (L) 6.5 - 8.1 g/dL    Albumin 3.6 3.5 - 5.0 g/dL    AST 24 15 - 41 U/L    ALT 18 14 - 54 U/L    Alkaline Phosphatase 49 38 - 126 U/L    Total Bilirubin 0.5 0.3 - 1.2 mg/dL    GFR calc non Af Amer >60 >60 mL/min    GFR calc Af Amer >60 >60 mL/min    Anion gap 11 5 - 15  Magnesium     Status: Abnormal    Collection Time: 06/09/17  4:48 PM  Result Value Ref  Range    Magnesium 1.6 (L) 1.7 - 2.4 mg/dL  CK     Status: Abnormal    Collection Time: 06/09/17  4:48 PM  Result Value Ref Range    Total CK 581 (H) 38 - 234 U/L  I-stat troponin, ED     Status: None    Collection Time: 06/09/17  4:54 PM  Result Value Ref Range    Troponin i, poc 0.00 0.00 - 0.08 ng/mL    Comment 3           I-Stat Chem 8, ED     Status: Abnormal    Collection Time: 06/09/17  4:56 PM  Result Value Ref Range    Sodium 142 135 - 145 mmol/L    Potassium 3.6 3.5 - 5.1 mmol/L    Chloride 105 101 - 111 mmol/L    BUN 9 6 - 20 mg/dL    Creatinine, Ser 0.50 0.44 - 1.00 mg/dL    Glucose, Bld 81 65 - 99 mg/dL    Calcium, Ion 1.27 1.15 - 1.40 mmol/L    TCO2 27 22 - 32 mmol/L    Hemoglobin 10.5 (L) 12.0 - 15.0 g/dL    HCT 31.0 (L) 36.0 - 46.0 %  Urinalysis, Routine w reflex microscopic     Status: Abnormal    Collection Time: 06/09/17  9:35 PM  Result Value Ref Range    Color, Urine STRAW (A) YELLOW    APPearance CLEAR CLEAR    Specific Gravity, Urine 1.023 1.005 - 1.030    pH 5.0 5.0 - 8.0    Glucose, UA NEGATIVE NEGATIVE mg/dL    Hgb urine dipstick NEGATIVE NEGATIVE    Bilirubin Urine NEGATIVE NEGATIVE    Ketones, ur NEGATIVE NEGATIVE mg/dL    Protein, ur NEGATIVE NEGATIVE mg/dL    Nitrite NEGATIVE NEGATIVE    Leukocytes, UA TRACE (A) NEGATIVE    RBC / HPF 0-5 0 - 5 RBC/hpf    WBC, UA 0-5 0 - 5 WBC/hpf    Bacteria, UA NONE SEEN NONE SEEN     Squamous Epithelial / LPF 0-5 0 - 5    Mucus PRESENT    Lipid panel     Status: Abnormal    Collection Time: 06/10/17  5:45 AM  Result Value Ref Range    Cholesterol 299 (H) 0 - 200 mg/dL    Triglycerides 347 (H) <150 mg/dL    HDL 66 >40 mg/dL    Total CHOL/HDL Ratio 4.5 RATIO    VLDL 69 (H) 0 - 40 mg/dL    LDL Cholesterol 164 (H) 0 - 99 mg/dL       Imaging Results (Last 48 hours)  Ct Angio Head W Or Wo Contrast   Result Date: 06/09/2017 CLINICAL DATA:  Generalized numbness beginning in bilateral hands this morning. Follow-up infarct. History of hyperlipidemia. EXAM: CT ANGIOGRAPHY HEAD AND NECK TECHNIQUE: Multidetector CT imaging of the head and neck was performed using the standard protocol during bolus administration of intravenous contrast. Multiplanar CT image reconstructions and MIPs were obtained to evaluate the vascular anatomy. Carotid stenosis measurements (when applicable) are obtained utilizing NASCET criteria, using the distal internal carotid diameter as the denominator. CONTRAST:  70mL ISOVUE-370 IOPAMIDOL (ISOVUE-370) INJECTION 76% COMPARISON:  MRI of the head Jun 09, 2017 FINDINGS: CTA NECK FINDINGS: AORTIC ARCH: Normal appearance of the thoracic arch, normal branch pattern. Mild calcific atherosclerosis. The origins of the innominate, left Common carotid artery and subclavian artery are widely patent. RIGHT  CAROTID SYSTEM: Common carotid artery is patent. Normal appearance of the carotid bifurcation without hemodynamically significant stenosis by NASCET criteria. Normal appearance of the internal carotid artery. LEFT CAROTID SYSTEM: Common carotid artery is patent. Normal appearance of the carotid bifurcation without hemodynamically significant stenosis by NASCET criteria. Normal appearance of the internal carotid artery. VERTEBRAL ARTERIES:Left vertebral artery is dominant. Normal appearance of the vertebral arteries, widely patent. SKELETON: No acute osseous process though bone  windows have not been submitted. Minimal grade 1 C4-5 anterolisthesis with multilevel advanced upper cervical facet arthropathy better demonstrated on today's MRI of the cervical spine. OTHER NECK: Soft tissues of the neck are nonacute though, not tailored for evaluation. RIGHT chest Port-A-Cath. UPPER CHEST: Multiple RIGHT upper lobe subsolid pulmonary nodules measuring to 5 mm. No superior mediastinal lymphadenopathy. CTA HEAD FINDINGS: ANTERIOR CIRCULATION: Patent cervical internal carotid arteries, petrous, cavernous and supra clinoid internal carotid arteries. Patent anterior and middle cerebral arteries. Multifocal severe stenosis bilateral A2 and A3 segments. Severe stenosis LEFT M2 inferior division origin. Multifocal severe stenosis bilateral mid to distal MCA including LEFT M3 segment. No large vessel occlusion, contrast extravasation or aneurysm. POSTERIOR CIRCULATION: Patent vertebral arteries, vertebrobasilar junction and basilar artery, as well as main branch vessels. Patent posterior cerebral arteries. Moderate stenosis RIGHT P2 segment. No large vessel occlusion, contrast extravasation or aneurysm. VENOUS SINUSES: Major dural venous sinuses are patent though not tailored for evaluation on this angiographic examination. ANATOMIC VARIANTS: None. DELAYED PHASE: No abnormal intracranial enhancement. MIP images reviewed. IMPRESSION: CTA NECK: 1. No hemodynamically significant stenosis or acute vascular process in the neck. 2. **An incidental finding of potential clinical significance has been found. Multiple RIGHT upper lobe pulmonary nodules measuring less than 6 mm. Non-contrast chest CT at 3-6 months is recommended. If nodules persist and are stable at that time, consider additional non-contrast chest CT examinations at 2 and 4 years. This recommendation follows the consensus statement: Guidelines for Management of Incidental Pulmonary Nodules Detected on CT Images: From the Fleischner Society 2017;  Radiology 2017; 284:228-243.** CTA HEAD: 1. No emergent large vessel occlusion. 2. Multifocal severe stenosis anterior and middle cerebral artery's may be secondary to atherosclerosis or hypertensive vasculopathy. Aortic Atherosclerosis (ICD10-I70.0). Electronically Signed   By: Elon Alas M.D.   On: 06/09/2017 20:54    Ct Head Wo Contrast   Result Date: 06/09/2017 CLINICAL DATA:  Numbness and weakness. EXAM: CT HEAD WITHOUT CONTRAST TECHNIQUE: Contiguous axial images were obtained from the base of the skull through the vertex without intravenous contrast. COMPARISON:  10/22/2016 FINDINGS: Brain: More prominent hypodensity along the anterior limb of the right internal capsule may represent a subacute lacunar infarct. Older adjacent lacunar infarct of the basal ganglia appears stable. Stable advanced small vessel ischemic disease in the periventricular white matter. The brain demonstrates no evidence of hemorrhage, edema, mass effect, extra-axial fluid collection, hydrocephalus or mass lesion. Vascular: No hyperdense vessel or unexpected calcification. Skull: The skull shows osteopenia without evidence of fracture or bony lesions. Sinuses/Orbits: There are air-fluid levels in both visualized maxillary antra. Mucosal thickening also noted in bilateral ethmoid and sphenoid air cells. Other: None. IMPRESSION: 1. More prominent hypodensity along the anterior limb of the right internal capsule may represent a subacute lacunar infarct. Older lacunar infarcts and advanced small vessel disease appears stable. 2. New air-fluid levels in both maxillary antra as well as mucosal thickening in bilateral ethmoid and sphenoid air cells are suggestive of active sinusitis. Electronically Signed   By: Jenness Corner.D.  On: 06/09/2017 15:37    Ct Angio Neck W And/or Wo Contrast   Result Date: 06/09/2017 CLINICAL DATA:  Generalized numbness beginning in bilateral hands this morning. Follow-up infarct. History of  hyperlipidemia. EXAM: CT ANGIOGRAPHY HEAD AND NECK TECHNIQUE: Multidetector CT imaging of the head and neck was performed using the standard protocol during bolus administration of intravenous contrast. Multiplanar CT image reconstructions and MIPs were obtained to evaluate the vascular anatomy. Carotid stenosis measurements (when applicable) are obtained utilizing NASCET criteria, using the distal internal carotid diameter as the denominator. CONTRAST:  1mL ISOVUE-370 IOPAMIDOL (ISOVUE-370) INJECTION 76% COMPARISON:  MRI of the head Jun 09, 2017 FINDINGS: CTA NECK FINDINGS: AORTIC ARCH: Normal appearance of the thoracic arch, normal branch pattern. Mild calcific atherosclerosis. The origins of the innominate, left Common carotid artery and subclavian artery are widely patent. RIGHT CAROTID SYSTEM: Common carotid artery is patent. Normal appearance of the carotid bifurcation without hemodynamically significant stenosis by NASCET criteria. Normal appearance of the internal carotid artery. LEFT CAROTID SYSTEM: Common carotid artery is patent. Normal appearance of the carotid bifurcation without hemodynamically significant stenosis by NASCET criteria. Normal appearance of the internal carotid artery. VERTEBRAL ARTERIES:Left vertebral artery is dominant. Normal appearance of the vertebral arteries, widely patent. SKELETON: No acute osseous process though bone windows have not been submitted. Minimal grade 1 C4-5 anterolisthesis with multilevel advanced upper cervical facet arthropathy better demonstrated on today's MRI of the cervical spine. OTHER NECK: Soft tissues of the neck are nonacute though, not tailored for evaluation. RIGHT chest Port-A-Cath. UPPER CHEST: Multiple RIGHT upper lobe subsolid pulmonary nodules measuring to 5 mm. No superior mediastinal lymphadenopathy. CTA HEAD FINDINGS: ANTERIOR CIRCULATION: Patent cervical internal carotid arteries, petrous, cavernous and supra clinoid internal carotid  arteries. Patent anterior and middle cerebral arteries. Multifocal severe stenosis bilateral A2 and A3 segments. Severe stenosis LEFT M2 inferior division origin. Multifocal severe stenosis bilateral mid to distal MCA including LEFT M3 segment. No large vessel occlusion, contrast extravasation or aneurysm. POSTERIOR CIRCULATION: Patent vertebral arteries, vertebrobasilar junction and basilar artery, as well as main branch vessels. Patent posterior cerebral arteries. Moderate stenosis RIGHT P2 segment. No large vessel occlusion, contrast extravasation or aneurysm. VENOUS SINUSES: Major dural venous sinuses are patent though not tailored for evaluation on this angiographic examination. ANATOMIC VARIANTS: None. DELAYED PHASE: No abnormal intracranial enhancement. MIP images reviewed. IMPRESSION: CTA NECK: 1. No hemodynamically significant stenosis or acute vascular process in the neck. 2. **An incidental finding of potential clinical significance has been found. Multiple RIGHT upper lobe pulmonary nodules measuring less than 6 mm. Non-contrast chest CT at 3-6 months is recommended. If nodules persist and are stable at that time, consider additional non-contrast chest CT examinations at 2 and 4 years. This recommendation follows the consensus statement: Guidelines for Management of Incidental Pulmonary Nodules Detected on CT Images: From the Fleischner Society 2017; Radiology 2017; 284:228-243.** CTA HEAD: 1. No emergent large vessel occlusion. 2. Multifocal severe stenosis anterior and middle cerebral artery's may be secondary to atherosclerosis or hypertensive vasculopathy. Aortic Atherosclerosis (ICD10-I70.0). Electronically Signed   By: Elon Alas M.D.   On: 06/09/2017 20:54    Mr Brain Wo Contrast   Result Date: 06/09/2017 CLINICAL DATA:  Generalized numbness beginning in upper extremity since this morning, weakness. Recent viral illness, on prednisone. EXAM: MRI HEAD WITHOUT CONTRAST MRI CERVICAL SPINE  WITHOUT CONTRAST TECHNIQUE: Multiplanar, multiecho pulse sequences of the brain and surrounding structures, and cervical spine, to include the craniocervical junction and cervicothoracic junction, were obtained  without intravenous contrast. COMPARISON:  CT HEAD Jun 09, 2017 and MRI of the cervical spine March 23, 2017 and MRI of the head April 22, 2012 FINDINGS: MRI HEAD FINDINGS INTRACRANIAL CONTENTS: Patchy reduced diffusion LEFT frontoparietal lobes with low ADC values. Subcentimeter reduced diffusion LEFT thalamus with normalized ADC values. No susceptibility artifact to suggest hemorrhage. Confluent supratentorial white matter FLAIR T2 hyperintensities. Patchy pontine T2 hyperintensities. Old RIGHT basal ganglia and RIGHT thalamus cystic small infarcts. Hazy T2 hyperintensities bilateral basal ganglia and bilateral thalami seen with chronic small vessel ischemic changes. No parenchymal brain volume loss for age. No midline shift, mass effect or masses. VASCULAR: Normal major intracranial vascular flow voids present at skull base. SKULL AND UPPER CERVICAL SPINE: No abnormal sellar expansion. No suspicious calvarial bone marrow signal. Craniocervical junction maintained. SINUSES/ORBITS: The mastoid air-cells and included paranasal sinuses are well-aerated.The included ocular globes and orbital contents are non-suspicious. OTHER: None. MRI CERVICAL SPINE FINDINGS ALIGNMENT: Straightened cervical lordosis. Minimal grade 1 C4-5 anterolisthesis. VERTEBRAE/DISCS: Vertebral bodies are intact. Intervertebral disc and demonstrate normal morphology with mild desiccation. No abnormal or acute bone marrow signal. CORD:Cervical spinal cord is normal morphology and signal characteristics from the cervicomedullary junction to level of T2-3, the most caudal well visualized level. POSTERIOR FOSSA, VERTEBRAL ARTERIES, PARASPINAL TISSUES: No MR findings of ligamentous injury. Vertebral artery flow voids present. Included posterior  fossa and paraspinal soft tissues are normal. DISC LEVELS: C2-3: No disc bulge. Uncovertebral hypertrophy and severe facet arthropathy without canal stenosis. Moderate to severe RIGHT neural foraminal narrowing. C3-4: Uncovertebral hypertrophy. Moderate to severe bilateral facet arthropathy. No canal stenosis. Moderate to severe LEFT neural foraminal narrowing. C4-5: Anterolisthesis. Severe RIGHT and moderate LEFT facet arthropathy with trace RIGHT facet effusion. No canal stenosis or neural foraminal narrowing. C5-6, C6-7: No disc bulge, canal stenosis nor neural foraminal narrowing. C7-T1: No disc bulge. Mild facet arthropathy. No canal stenosis. Mild LEFT neural foraminal narrowing. IMPRESSION: MRI HEAD: 1. Patchy acute nonhemorrhagic infarcts LEFT frontoparietal/MCA territories. Subacute subcentimeter LEFT thalamus infarct. 2. Old RIGHT basal ganglia and old RIGHT thalamus small infarcts. Severe chronic small vessel ischemic changes. MRI CERVICAL SPINE: 1. Progressed degenerative change of the cervical spine, grade 1 C4-5 anterolisthesis. 2. No canal stenosis. Moderate to severe C2-3 and C3-4 neural foraminal narrowing. Electronically Signed   By: Elon Alas M.D.   On: 06/09/2017 18:59    Mr Cervical Spine Wo Contrast   Result Date: 06/09/2017 CLINICAL DATA:  Generalized numbness beginning in upper extremity since this morning, weakness. Recent viral illness, on prednisone. EXAM: MRI HEAD WITHOUT CONTRAST MRI CERVICAL SPINE WITHOUT CONTRAST TECHNIQUE: Multiplanar, multiecho pulse sequences of the brain and surrounding structures, and cervical spine, to include the craniocervical junction and cervicothoracic junction, were obtained without intravenous contrast. COMPARISON:  CT HEAD Jun 09, 2017 and MRI of the cervical spine March 23, 2017 and MRI of the head April 22, 2012 FINDINGS: MRI HEAD FINDINGS INTRACRANIAL CONTENTS: Patchy reduced diffusion LEFT frontoparietal lobes with low ADC values.  Subcentimeter reduced diffusion LEFT thalamus with normalized ADC values. No susceptibility artifact to suggest hemorrhage. Confluent supratentorial white matter FLAIR T2 hyperintensities. Patchy pontine T2 hyperintensities. Old RIGHT basal ganglia and RIGHT thalamus cystic small infarcts. Hazy T2 hyperintensities bilateral basal ganglia and bilateral thalami seen with chronic small vessel ischemic changes. No parenchymal brain volume loss for age. No midline shift, mass effect or masses. VASCULAR: Normal major intracranial vascular flow voids present at skull base. SKULL AND UPPER CERVICAL SPINE: No abnormal sellar expansion.  No suspicious calvarial bone marrow signal. Craniocervical junction maintained. SINUSES/ORBITS: The mastoid air-cells and included paranasal sinuses are well-aerated.The included ocular globes and orbital contents are non-suspicious. OTHER: None. MRI CERVICAL SPINE FINDINGS ALIGNMENT: Straightened cervical lordosis. Minimal grade 1 C4-5 anterolisthesis. VERTEBRAE/DISCS: Vertebral bodies are intact. Intervertebral disc and demonstrate normal morphology with mild desiccation. No abnormal or acute bone marrow signal. CORD:Cervical spinal cord is normal morphology and signal characteristics from the cervicomedullary junction to level of T2-3, the most caudal well visualized level. POSTERIOR FOSSA, VERTEBRAL ARTERIES, PARASPINAL TISSUES: No MR findings of ligamentous injury. Vertebral artery flow voids present. Included posterior fossa and paraspinal soft tissues are normal. DISC LEVELS: C2-3: No disc bulge. Uncovertebral hypertrophy and severe facet arthropathy without canal stenosis. Moderate to severe RIGHT neural foraminal narrowing. C3-4: Uncovertebral hypertrophy. Moderate to severe bilateral facet arthropathy. No canal stenosis. Moderate to severe LEFT neural foraminal narrowing. C4-5: Anterolisthesis. Severe RIGHT and moderate LEFT facet arthropathy with trace RIGHT facet effusion. No canal  stenosis or neural foraminal narrowing. C5-6, C6-7: No disc bulge, canal stenosis nor neural foraminal narrowing. C7-T1: No disc bulge. Mild facet arthropathy. No canal stenosis. Mild LEFT neural foraminal narrowing. IMPRESSION: MRI HEAD: 1. Patchy acute nonhemorrhagic infarcts LEFT frontoparietal/MCA territories. Subacute subcentimeter LEFT thalamus infarct. 2. Old RIGHT basal ganglia and old RIGHT thalamus small infarcts. Severe chronic small vessel ischemic changes. MRI CERVICAL SPINE: 1. Progressed degenerative change of the cervical spine, grade 1 C4-5 anterolisthesis. 2. No canal stenosis. Moderate to severe C2-3 and C3-4 neural foraminal narrowing. Electronically Signed   By: Elon Alas M.D.   On: 06/09/2017 18:59    Dg Chest Port 1 View   Result Date: 06/09/2017 CLINICAL DATA:  Weakness EXAM: PORTABLE CHEST 1 VIEW COMPARISON:  01/02/2017 FINDINGS: Porta catheter on the right with tip at the upper cavoatrial junction. Borderline heart size. Chronic right diaphragm elevation with scarring at the right base. There is no edema, consolidation, effusion, or pneumothorax. No acute osseous finding. Distal right clavicle resection. IMPRESSION: Stable from prior.  No evidence of active disease. Electronically Signed   By: Monte Fantasia M.D.   On: 06/09/2017 16:13    Dg Swallowing Func-speech Pathology   Result Date: 06/10/2017 Objective Swallowing Evaluation: Type of Study: MBS-Modified Barium Swallow Study  Patient Details Name: MIRAYAH WREN MRN: [id number] Date of Birth: [birth year] Today's Date: 06/10/2017 Time: SLP Start Time (ACUTE ONLY): 0825 -SLP Stop Time (ACUTE ONLY): 0859 SLP Time Calculation (min) (ACUTE ONLY): 34 min Past Medical History: Past Medical History: Diagnosis Date . Allergic rhinitis, cause unspecified  . Benign neoplasm of colon  . Esophageal reflux  . Excessive daytime sleepiness  . 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 GI hemorrhage 2011  recurrent . Hyperlipidemia  .  Osteoporosis  . Polymyositis ([place])  . Unspecified essential hypertension  . Vitamin D deficiency  Past Surgical History: Past Surgical History: Procedure Laterality Date . ABDOMINAL HYSTERECTOMY    partial . bilateral  rotator cuff surgery   . COLONOSCOPY  01/09/2011  others also . COLONOSCOPY  05/29/2011  Procedure: COLONOSCOPY;  Surgeon: Gatha Mayer, MD;  Location: [facility];  Service: Endoscopy;  Laterality: N/A;  Greggory Brandy Carlean Purl . COLONOSCOPY WITH PROPOFOL N/A 02/16/2014  Procedure: COLONOSCOPY WITH PROPOFOL;  Surgeon: Milus Banister, MD;  Location: [facility];  Service: Endoscopy;  Laterality: N/A; . ESOPHAGOGASTRODUODENOSCOPY  12/08/2010  others also . ESOPHAGOGASTRODUODENOSCOPY (EGD) WITH PROPOFOL N/A 02/16/2014  Procedure: ESOPHAGOGASTRODUODENOSCOPY (EGD) WITH PROPOFOL;  Surgeon: Milus Banister, MD;  Location: [facility];  Service: Endoscopy;  Laterality: N/A; . EUS   . HOT HEMOSTASIS  05/29/2011  Procedure: HOT HEMOSTASIS (ARGON PLASMA COAGULATION/BICAP);  Surgeon: Gatha Mayer, MD;  Location: [facility];  Service: Endoscopy;  Laterality: N/A; . LUMBAR LAMINECTOMY   . 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  Pt initially passed RNSSS but then was noted to have foaming secretions in mouth and drooling.  Pt is wheelchair bound and has chronic upper body weakness.  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  SLP advised to order MBS in lieu of clinical evaluation.   Subjective: pt awake in chair Assessment / Plan / Recommendation CHL IP CLINICAL IMPRESSIONS 06/10/2017 Clinical Impression Pt presents with overall functional oropharyngeal swallow.  NO aspiration/consistent laryngeal penetration of consistencies tested.  Pt did have a single episode of laryngeal penetration to vocal folds with thin x1 clearing independently.  Of note, pt did cough during MBS  but barium was not visualized in larynx.  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   SLP Visit Diagnosis Dysphagia, pharyngoesophageal phase (R13.14) Attention and concentration deficit following -- Frontal lobe and executive function deficit following -- Impact on safety and function Moderate aspiration risk   CHL IP TREATMENT RECOMMENDATION 06/10/2017 Treatment Recommendations Therapy as outlined in treatment plan below   Prognosis 06/10/2017 Prognosis for Safe Diet Advancement Guarded Barriers to Reach Goals -- Barriers/Prognosis Comment -- CHL IP DIET RECOMMENDATION 06/10/2017 SLP Diet Recommendations Dysphagia 3 (Mech soft) solids;Thin liquid Liquid Administration via Cup;Straw Medication Administration Whole meds with puree Compensations Slow rate;Small sips/bites Postural Changes Seated upright at 90 degrees;Remain semi-upright after after feeds/meals (Comment)   CHL IP OTHER RECOMMENDATIONS 06/10/2017 Recommended Consults -- Oral Care Recommendations Oral care BID Other Recommendations --   CHL IP FOLLOW UP RECOMMENDATIONS 06/10/2017 Follow up Recommendations None   CHL IP FREQUENCY AND DURATION 06/10/2017 Speech Therapy Frequency (ACUTE ONLY) min 1 x/week Treatment Duration 1 week      CHL IP ORAL PHASE 06/10/2017 Oral Phase WFL Oral - Pudding Teaspoon -- Oral - Pudding Cup -- Oral - Honey Teaspoon -- Oral - Honey Cup -- Oral - Nectar Teaspoon WFL Oral - Nectar Cup -- Oral - Nectar Straw WFL Oral - Thin Teaspoon WFL Oral - Thin Cup WFL Oral - Thin Straw WFL Oral - Puree WFL Oral - Mech Soft -- Oral - Regular WFL Oral - Multi-Consistency -- Oral - Pill WFL Oral Phase - Comment --  CHL IP PHARYNGEAL PHASE 06/10/2017 Pharyngeal Phase Impaired Pharyngeal- Pudding Teaspoon -- Pharyngeal -- Pharyngeal- Pudding Cup -- Pharyngeal -- Pharyngeal- Honey Teaspoon -- Pharyngeal -- Pharyngeal- Honey Cup -- Pharyngeal -- Pharyngeal- Nectar Teaspoon  Delayed swallow initiation-vallecula Pharyngeal -- Pharyngeal- Nectar Cup -- Pharyngeal -- Pharyngeal- Nectar Straw Delayed swallow initiation-vallecula Pharyngeal -- Pharyngeal- Thin Teaspoon Delayed swallow initiation-vallecula;WFL Pharyngeal -- Pharyngeal- Thin Cup WFL;Delayed swallow initiation-vallecula Pharyngeal -- Pharyngeal- Thin Straw Penetration/Aspiration before swallow;Delayed swallow initiation-vallecula;Delayed swallow initiation-pyriform sinuses Pharyngeal Material enters airway, CONTACTS cords and then ejected out Pharyngeal- Puree WFL Pharyngeal -- Pharyngeal- Mechanical Soft -- Pharyngeal -- Pharyngeal- Regular WFL Pharyngeal -- Pharyngeal- Multi-consistency -- Pharyngeal -- Pharyngeal- Pill WFL Pharyngeal -- Pharyngeal Comment trace silent laryngeal penetration of thin via straw to vocal cords x1 of 5 boluses   CHL IP CERVICAL ESOPHAGEAL PHASE 06/10/2017  Cervical Esophageal Phase Impaired Pudding Teaspoon -- Pudding Cup -- Honey Teaspoon -- Honey Cup -- Nectar Teaspoon Reduced cricopharyngeal relaxation;Prominent cricopharyngeal segment Nectar Cup -- Nectar Straw Reduced cricopharyngeal relaxation;Prominent cricopharyngeal segment Thin Teaspoon Reduced cricopharyngeal relaxation;Prominent cricopharyngeal segment Thin Cup Reduced cricopharyngeal relaxation;Prominent cricopharyngeal segment Thin Straw Reduced cricopharyngeal relaxation;Prominent cricopharyngeal segment Puree Reduced cricopharyngeal relaxation;Prominent cricopharyngeal segment Mechanical Soft -- Regular Reduced cricopharyngeal relaxation;Prominent cricopharyngeal segment Multi-consistency -- Pill Prominent cricopharyngeal segment;Reduced cricopharyngeal relaxation Cervical Esophageal Comment appearance of possibly developing prominent cricopharyngeus that may be developing into Zenker's diverticulum,  No flowsheet data found. Macario Golds 06/10/2017, 9:35 AM    Luanna Salk, MS [facility] SLP [phone]                Assessment/Plan: Diagnosis: infarct left frontoparietal MCA Labs and images independently reviewed.  Records reviewed and summated above. Stroke: Continue secondary stroke prophylaxis and Risk Factor Modification listed below:   Antiplatelet therapy:   Blood Pressure Management:  Continue current medication with prn's with permisive HTN per primary team Statin Agent:   Right sided hemiparesis: fit for orthosis to prevent contractures (resting hand splint for day, wrist cock up splint at night, PRAFO, etc) Motor recovery: Fluoxetine   1. Does the need for close, 24 hr/day medical supervision in concert with the patient's rehab needs make it unreasonable for this patient to be served in a less intensive setting? Yes  2. Co-Morbidities requiring supervision/potential complications: HTN (monitor and provide prns in accordance with increased physical exertion and pain), hypokalemia (continue to monitor and replete as necessary), polymyositis, diastolic congestive heart failure (monitor for signs and symptoms of fluid overload), colonic AVM, acute blood loss anemia (transfuse if necessary to ensure appropriate perfusion for increased activity tolerance) 3. Due to bladder management, safety, skin/wound care, disease management and patient education, does the patient require 24 hr/day rehab nursing? Yes 4. Does the patient require coordinated care of a physician, rehab nurse, PT (1-2 hrs/day, 5 days/week), OT (1-2 hrs/day, 5 days/week) and SLP (1-2 hrs/day, 5 days/week) to address physical and functional deficits in the context of the above medical diagnosis(es)? Yes Addressing deficits in the following areas: balance, endurance, locomotion, strength, transferring, bathing, dressing, toileting, speech, swallowing and psychosocial support 5. Can the patient actively participate in an intensive therapy program of at least 3 hrs of therapy per day at least 5 days per week? Potentially 6. The potential for  patient to make measurable gains while on inpatient rehab is good 7. Anticipated functional outcomes upon discharge from inpatient rehab are min assist and mod assist  with PT, min assist and mod assist with OT, modified independent and supervision with SLP. 8. Estimated rehab length of stay to reach the above functional goals is: 17-20 days. 9. Anticipated D/C setting: Home 10. Anticipated post D/C treatments: HH therapy and Home excercise program 11. Overall Rehab/Functional Prognosis: good and fair   RECOMMENDATIONS: This patient's condition is appropriate for continued rehabilitative care in the following setting: CIR if adequate caregiver support available upon discharge after completion of medical work-up. Patient has agreed to participate in recommended program. Potentially Note that insurance prior authorization may be required for reimbursement for recommended care.   Comment: Rehab Admissions Coordinator to follow up.     I have personally performed a face to face diagnostic evaluation, including, but not limited to relevant history and physical exam findings, of this patient and developed relevant assessment and plan.  Additionally, I have reviewed and concur with the physician assistant's documentation above.  Delice Lesch, MD, ABPMR Lavon Paganini Angiulli, PA-C 06/10/2017          Revision History        Routing History

## 2017-06-13 ENCOUNTER — Inpatient Hospital Stay (HOSPITAL_COMMUNITY): Payer: Medicare Other | Admitting: Occupational Therapy

## 2017-06-13 ENCOUNTER — Inpatient Hospital Stay (HOSPITAL_COMMUNITY): Payer: Medicare Other

## 2017-06-13 ENCOUNTER — Inpatient Hospital Stay (HOSPITAL_COMMUNITY): Payer: Medicare Other | Admitting: Physical Therapy

## 2017-06-13 DIAGNOSIS — M792 Neuralgia and neuritis, unspecified: Secondary | ICD-10-CM

## 2017-06-13 DIAGNOSIS — I10 Essential (primary) hypertension: Secondary | ICD-10-CM

## 2017-06-13 DIAGNOSIS — D62 Acute posthemorrhagic anemia: Secondary | ICD-10-CM

## 2017-06-13 LAB — CBC
HEMATOCRIT: 33.7 % — AB (ref 36.0–46.0)
HEMOGLOBIN: 10.3 g/dL — AB (ref 12.0–15.0)
MCH: 25.4 pg — AB (ref 26.0–34.0)
MCHC: 30.6 g/dL (ref 30.0–36.0)
MCV: 83.2 fL (ref 78.0–100.0)
Platelets: 290 10*3/uL (ref 150–400)
RBC: 4.05 MIL/uL (ref 3.87–5.11)
RDW: 17.8 % — ABNORMAL HIGH (ref 11.5–15.5)
WBC: 7.9 10*3/uL (ref 4.0–10.5)

## 2017-06-13 LAB — CREATININE, SERUM: Creatinine, Ser: <0.3 mg/dL — ABNORMAL LOW (ref 0.44–1.00)

## 2017-06-13 MED ORDER — ALTEPLASE 2 MG IJ SOLR
2.0000 mg | Freq: Once | INTRAMUSCULAR | Status: AC
  Administered 2017-06-13: 2 mg

## 2017-06-13 MED ORDER — CHLORHEXIDINE GLUCONATE CLOTH 2 % EX PADS
6.0000 | MEDICATED_PAD | Freq: Every day | CUTANEOUS | Status: DC
  Administered 2017-06-13 – 2017-06-25 (×10): 6 via TOPICAL

## 2017-06-13 NOTE — Progress Notes (Signed)
RN was notified that patient's son transferred patient from wheelchair to bathroom without notifying RN and NT. PT was in room to help transfer patient back to wheelchair. Educated family members.

## 2017-06-13 NOTE — Progress Notes (Signed)
Initial Nutrition Assessment  DOCUMENTATION CODES:   Non-severe (moderate) malnutrition in context of chronic illness, Obesity unspecified  INTERVENTION:  - Will order Ensure Enlive BID, each supplement provides 350 kcal and 20 grams of protein. - Continue to encourage PO intakes.   NUTRITION DIAGNOSIS:   Moderate Malnutrition related to chronic illness(polymoysitis) as evidenced by mild fat depletion, moderate fat depletion, mild muscle depletion, moderate muscle depletion.  GOAL:   Patient will meet greater than or equal to 90% of their needs  MONITOR:   PO intake, Supplement acceptance, Weight trends, Labs  REASON FOR ASSESSMENT:   Malnutrition Screening Tool  ASSESSMENT:   77 year old right-handed female with history of hypertension, polymyositis, diastolic congestive heart failure, colonic AVM.  Presented [facility] room [facility]/09/2017 with complaints of generalized numbness and weakness.  At baseline patient is essentially wheelchair-bound and has chronic bilateral upper extremity weakness attributed to polymyositis.  BMI indicates obesity. No intakes documented this admission. Pt was discharged on 5/30 and re-admitted on 5/31. She was seen by an RD on 5/29. Per chart review, she consumed 60% of dinner on 5/29 and 25% of breakfast on 5/30. She confirms that she has had a decreased appetite for ~2 weeks and that during that time she would typically only have one meal/day, often breakfast meal. She also confirms that prior to that 2 week period, her appetite was at baseline and that she was eating 2 larger meals/day (her usual).   She states that her UBW is ~180 lbs and that she last weighed this around Valentine's Day. Based on UBW and current weight, pt has lost 10 lbs (5.5% body weight) in 3-3.5 months. This is not significant for time frame.  Medications reviewed; 2 tablets Oscal/day, 40 mg oral Lasix/day, 40 mg oral Protonix/day. Labs reviewed.    NUTRITION -  FOCUSED PHYSICAL EXAM:    Most Recent Value  Orbital Region  Mild depletion  Upper Arm Region  Severe depletion  Thoracic and Lumbar Region  Moderate depletion  Buccal Region  Mild depletion  Temple Region  Mild depletion  Clavicle Bone Region  Moderate depletion  Clavicle and Acromion Bone Region  Severe depletion  Scapular Bone Region  Unable to assess  Dorsal Hand  No depletion  Patellar Region  Mild depletion  Anterior Thigh Region  Moderate depletion  Posterior Calf Region  Moderate depletion  Edema (RD Assessment)  None  Hair  Reviewed  Eyes  Reviewed  Mouth  Reviewed  Skin  Reviewed  Nails  Reviewed       Diet Order:   Diet Order           DIET DYS 3 Room service appropriate? Yes; Fluid consistency: Thin  Diet effective now          EDUCATION NEEDS:   No education needs have been identified at this time  Skin:  Skin Assessment: Reviewed RN Assessment  Last BM:  5/28 (PTA)  Height:   Ht Readings from Last 1 Encounters:  06/12/17 5\' 3"  (1.6 m)    Weight:   Wt Readings from Last 1 Encounters:  06/12/17 170 lb (77.1 kg)    Ideal Body Weight:  52.27 kg  BMI:  Body mass index is 30.11 kg/m.  Estimated Nutritional Needs:   Kcal:  1540-1775 (20-23 kcal/kg)  Protein:  75-90 grams  Fluid:  >/= 1.5 L/day      Jarome Matin, MS, RD, LDN, CNSC Inpatient Clinical Dietitian Pager # [phone] After hours/weekend pager #  [phone]  

## 2017-06-13 NOTE — Progress Notes (Signed)
[place] PHYSICAL MEDICINE & REHABILITATION     PROGRESS NOTE  Subjective/Complaints:  Patient seen lying in bed this morning.  She states she did not sleep well overnight.  She states her head became stuck between the rails and had to ask for assistance to remove it.  ROS: Denies CP, S OB, nausea, vomiting, diarrhea.  Objective: Vital Signs: Blood pressure (!) 175/72, pulse 64, temperature (!) 97.5 F (36.4 C), resp. rate 16, height 5\' 3"  (1.6 m), weight 77.1 kg (170 lb), SpO2 98 %. No results found. Recent Labs    06/13/17 1558  WBC 7.9  HGB 10.3*  HCT 33.7*  PLT 290   No results for input(s): NA, K, CL, GLUCOSE, BUN, CREATININE, CALCIUM in the last 72 hours.  Invalid input(s): CO CBG (last 3)  No results for input(s): GLUCAP in the last 72 hours.  Wt Readings from Last 3 Encounters:  06/12/17 77.1 kg (170 lb)  06/09/17 77.1 kg (170 lb)  05/15/17 77.1 kg (170 lb)    Physical Exam:  BP (!) 175/72 (BP Location: Left Arm)   Pulse 64   Temp (!) 97.5 F (36.4 C)   Resp 16   Ht 5\' 3"  (1.6 m)   Wt 77.1 kg (170 lb)   SpO2 98%   BMI 30.11 kg/m   Constitutional: She appearswell-developed.  Obese. HENT: Poor dentition.  Normocephalic.  Atraumatic.   Eyes:EOMI.  No discharge.   Cardiovascular:Normal rate. No JVD. Respiratory:Effort normal.  Clear. GI: She exhibitsno distension.  Bowel sounds normal.   Musculoskeletal: No edema or tenderness in extremities  Neurological: She isalertand oriented. Dysarthria.  Motor: RUE 1/5 deltoid, biceps, triceps.  RLE: 1/5  hip flexion, knee extension, 4-/5 ankle dorsiflexion.    Assessment/Plan: 1. 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 Physiatrist is providing close team supervision and 24 hour management of active medical problems listed below. Physiatrist and  rehab team continue to assess barriers to discharge/monitor patient progress toward functional and medical goals.  Function:  Bathing Bathing position      Bathing parts   Body parts bathed by helper: Right arm, Left arm, Chest, Abdomen, Front perineal area, Buttocks, Right upper leg, Left upper leg, Right lower leg, Left lower leg, Back  Bathing assist Assist Level: (total A)      Upper Body Dressing/Undressing Upper body dressing   What is the patient wearing?: Hospital gown                Upper body assist Assist Level: (total A)      Lower Body Dressing/Undressing Lower body dressing   What is the patient wearing?: Non-skid slipper socks           Non-skid slipper socks- Performed by helper: Don/doff right sock, Don/doff left sock                  Lower body assist        Toileting Toileting Toileting activity did not occur: No continent bowel/bladder event        Toileting assist     Transfers Chair/bed transfer   Chair/bed transfer method: Lateral scoot Chair/bed transfer assist level: 2 helpers Chair/bed transfer assistive device: Sliding board, Armrests     Locomotion Ambulation Ambulation activity did not occur: Safety/medical concerns         Wheelchair   Type: Motorized  Assist Level: Dependent (Pt equals 0%)  Cognition Comprehension Comprehension assist level: Follows basic conversation/direction with no assist  Expression Expression assist level: Expresses basic 90% of the time/requires cueing < 10% of the time.  Social Interaction Social Interaction assist level: Interacts appropriately 90% of the time - Needs monitoring or encouragement for participation or interaction.  Problem Solving Problem solving assist level: Solves basic 75 - 89% of the time/requires cueing 10 - 24% of the time  Memory Memory assist level: Recognizes or recalls 90% of the time/requires cueing < 10% of the time    Medical Problem List and  Plan: 1.Decreased functional mobilitysecondary to left frontoparietal MCA infarction on 6/94 complicated by history of polymyositis  Begin CIR   Notes reviewed, labs reviewed, images reviewed 2. DVT Prophylax notes reviewed, labs reviewed, is/Anticoagulation: Subcutaneous Lovenox. Monitor for any bleeding episodes 3. Pain Management:Neurontin 100 mg twice daily Tylenol as needed 4. Mood:Xanax 0.25 mg twice daily as needed 5. Neuropsych: This patientiscapable of making decisions on herown behalf. 6. Skin/Wound Care:Routine skin checks 7. Fluids/Electrolytes/Nutrition:Routine in and outs   BMP within acceptable range on 5/28  Labs pending 8.Polymyositis. Continue CellCept 500 mg twice daily. Patient has been wheelchair-bound for approximately the past 8 months 9.Chronic diastolic congestive heart failure. Monitor for any signs of fluid overload Filed Weights   06/12/17 1842  Weight: 77.1 kg (170 lb)   10.Hypertension. Tenormin 50 mg daily, Lasix 40 mg daily prior to admission.   Monitor with increased mobility 11.Pulmonary nodules. Incidental finding on CTA of neck. Noncontrast CT 3 to 6 months 12.Hyperlipidemia. Lipitor 13.  Acute blood loss anemia   Hemoglobin 10.3 on 6/1   Continue to monitor  LOS (Days) 1 A FACE TO FACE EVALUATION WAS PERFORMED  Ankit Lorie Phenix 06/13/2017 4:41 PM

## 2017-06-13 NOTE — Evaluation (Signed)
Speech Language Pathology Assessment and Plan  Patient Details  Name: [name] [name] MRN: [id number] Date of Birth: December 22, 1940  SLP Diagnosis: Cognitive Impairments;Dysphagia  Rehab Potential: Good ELOS: 19-21 days     Today's Date: 06/13/2017 SLP Individual Time: 1315-1400 SLP Individual Time Calculation (min): 45 min   Problem List:  Patient Active Problem List   Diagnosis Date Noted  . Neuropathic pain   . Left middle cerebral artery stroke ([place]) 06/12/2017  . Neurologic deficit due to acute ischemic stroke ([place])   . Hypokalemia   . Chronic diastolic congestive heart failure ([place])   . AVM (arteriovenous malformation)   . CVA (cerebral vascular accident) ([place]) 06/09/2017  . Multiple pulmonary nodules 06/09/2017  . Hypothermia 10/22/2016  . Chronic diastolic CHF (congestive heart failure) ([place]) 10/22/2016  . Abdominal pain, unspecified site 05/17/2013  . Nausea alone 05/17/2013  . 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 Abdominal pain, left lower quadrant 05/28/2011  . Cecal ulcer with hemorrhage 01/10/2011  . Acute blood loss anemia 12/06/2010  . Hematochezia 12/05/2010  . Anemia 12/05/2010  . Adrenal insufficiency ([place]) 12/05/2010  . Osteoporosis 12/05/2010  . LUNG INVOLVEMENT OTHER DISEASES CLASSIFIED ELSW 08/13/2009  . OTHER SPECIFIED DISORDER OF STOMACH AND DUODENUM 07/06/2008  . Essential hypertension 04/26/2007  . ALLERGIC RHINITIS 04/26/2007  . GASTROESOPHAGEAL REFLUX DISEASE 04/26/2007  . Polymyositis ([place]) 04/26/2007  . COLONIC POLYPS, ADENOMATOUS 02/06/2005   Past Medical History:  Past Medical History:  Diagnosis Date  . Allergic rhinitis, cause unspecified   . Benign neoplasm of colon   . Esophageal reflux   . Excessive daytime sleepiness   . 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 GI hemorrhage 2011   recurrent  . Hyperlipidemia   . Osteoporosis   . Polymyositis ([place])   . Unspecified essential hypertension    . Vitamin D deficiency    Past Surgical History:  Past Surgical History:  Procedure Laterality Date  . ABDOMINAL HYSTERECTOMY     partial  . bilateral  rotator cuff surgery    . COLONOSCOPY  01/09/2011   others also  . COLONOSCOPY  05/29/2011   Procedure: COLONOSCOPY;  Surgeon: Gatha Mayer, MD;  Location: [facility] ENDOSCOPY;  Service: Endoscopy;  Laterality: N/A;  Greggory Brandy Carlean Purl  . COLONOSCOPY WITH PROPOFOL N/A 02/16/2014   Procedure: COLONOSCOPY WITH PROPOFOL;  Surgeon: Milus Banister, MD;  Location: [facility] ENDOSCOPY;  Service: Endoscopy;  Laterality: N/A;  . ESOPHAGOGASTRODUODENOSCOPY  12/08/2010   others also  . ESOPHAGOGASTRODUODENOSCOPY (EGD) WITH PROPOFOL N/A 02/16/2014   Procedure: ESOPHAGOGASTRODUODENOSCOPY (EGD) WITH PROPOFOL;  Surgeon: Milus Banister, MD;  Location: [facility] ENDOSCOPY;  Service: Endoscopy;  Laterality: N/A;  . EUS    . HOT HEMOSTASIS  05/29/2011   Procedure: HOT HEMOSTASIS (ARGON PLASMA COAGULATION/BICAP);  Surgeon: Gatha Mayer, MD;  Location: [facility];  Service: Endoscopy;  Laterality: N/A;  . LUMBAR LAMINECTOMY    . TONSILLECTOMY  age 20    Assessment / Plan / Recommendation Clinical Impression CATTALEYA WIEN is a 77 year old right-handed female with history of hypertension, polymyositis, diastolic congestive heart failure, colonic AVM.  Presented to [facility] emergency room 06/09/2017 with complaints of generalized numbness and weakness.  At baseline patient is essentially wheelchair-bound and has chronic bilateral upper extremity weakness attributed to polymyositis. CT of head reviewed, showing anterior capsule infarction.  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  Subacute subcentimeter left thalamus infarction as well as  old right basal ganglia and old right thalamus and small infarcts.  MRI cervical spine negative.  Patient did not receive TPA.  CT angiogram of head and neck with no emergent large vessel  occlusion.  Incidental findings of multiple right upper lobe pulmonary nodules and recommendations of CT of chest 3 to 6 months.  Echocardiogram with ejection fraction of 38% grade 1 diastolic dysfunction.  Currently maintained on aspirin and Plavix for CVA prophylaxis x3 months.  Subcutaneous Lovenox for DVT prophylaxis.  Patient developed upper respiratory infection.  Chest x-ray showed no evidence of active pulmonary disease.  Started on Augmentin 06/10/2017.  Tolerating a regular diet.  Physical and occupational therapy evaluations completed with recommendations of physical medicine rehab consult.  Patient is to be admitted for a comprehensive rehab program on 06/12/17.  Pt presents with min impairments in higher level cognitive skills of mildly complex problem solving and anticipatory awareness with novel deficits impacting functional of both arms, however family states at baseline and was considered Mod I piror to admission for cognitive needs. Pt managed medication and finances, however given subsections of ALFA on medication management required min A verbal cues.  Pt presents in prolong mastication of regular textured foods with no overt s/s aspiration, however SLP would like to assess diet tolerance at meal time prior to upgrade. Pt demonstrated no overt s/s aspiration on thin liquid via cup/straw. SLP recommends continuing dys 3 and thin liquid diet at this time.  Pt would benefit from skilled ST services in order to maximize functional independence and reduce burden of care prior to discharge   Skilled Therapeutic Interventions          Skilled ST services focused on cognitive skills. SLP facilitated problem solving skills in medication management task with min A verbal cues . Pt was left in room with call bell within reach and family present. Reccomend to continue skilled ST services.    SLP Assessment  Patient will need skilled Speech Lanaguage Pathology Services during CIR admission     Recommendations  SLP Diet Recommendations: Thin;Dysphagia 3 (Mech soft) Liquid Administration via: Straw;Cup Medication Administration: Whole meds with liquid Supervision: Full supervision/cueing for compensatory strategies;Staff to assist with self feeding Compensations: Slow rate;Small sips/bites Postural Changes and/or Swallow Maneuvers: Seated upright 90 degrees Oral Care Recommendations: Oral care BID Patient destination: Home Follow up Recommendations: None Equipment Recommended: None recommended by SLP    SLP Frequency 3 to 5 out of 7 days   SLP Duration  SLP Intensity  SLP Treatment/Interventions 19-21 days   Minumum of 1-2 x/day, 30 to 90 minutes  Cognitive remediation/compensation;Cueing hierarchy;Dysphagia/aspiration precaution training;Functional tasks;Patient/family education    Pain Pain Assessment Pain Score: 0-No pain  Prior Functioning Cognitive/Linguistic Baseline: Within functional limits(according to family ) Type of Home: Apartment  Lives With: Son Available Help at Discharge: Family Education: (11th grade ) Vocation: Unemployed  Function:  Eating Eating   Modified Consistency Diet: Yes Eating Assist Level: Help managing cup/glass;Helper brings food to mouth;Helper scoops food on utensil;Helper checks for pocketed food     Helper Scoops Food on Utensil: Every Photographer Food to Mouth: Every scoop   Cognition Comprehension Comprehension assist level: Follows basic conversation/direction with no assist  Expression   Expression assist level: Expresses basic 90% of the time/requires cueing < 10% of the time.  Social Interaction Social Interaction assist level: Interacts appropriately 90% of the time - Needs monitoring or encouragement for participation or interaction.  Problem Solving Problem solving assist  level: Solves basic 75 - 89% of the time/requires cueing 10 - 24% of the time  Memory Memory assist level: Recognizes or recalls 90%  of the time/requires cueing < 10% of the time   Short Term Goals: Week 1: SLP Short Term Goal 1 (Week 1): Pt will consume regular textured trials with efficitent mastication and no overt aspiration to demonstrate readiness for diet advancement. SLP Short Term Goal 2 (Week 1): Pt will demonstrate functional problem solving for familar mildly complex tasks with supervision A verbal and question cues. SLP Short Term Goal 3 (Week 1): Pt will identitfy 3 activties pt can and can not participate in at home due to current physical deficits given supervision A verbal cues,   Refer to Care Plan for Long Term Goals  Recommendations for other services: None   Discharge Criteria: Patient will be discharged from SLP if patient refuses treatment 3 consecutive times without medical reason, if treatment goals not met, if there is a change in medical status, if patient makes no progress towards goals or if patient is discharged from hospital.  The above assessment, treatment plan, treatment alternatives and goals were discussed and mutually agreed upon: by patient and by family  Lonzy Mato  [facility] 06/13/2017, 5:53 PM

## 2017-06-13 NOTE — Evaluation (Signed)
Occupational Therapy Assessment and Plan  Patient Details  Name: [name] [name] MRN: [id number] Date of Birth: May 20, 1940  OT Diagnosis: abnormal posture, hemiplegia affecting dominant side, muscle weakness (generalized) and coordination disorder Rehab Potential: Rehab Potential (ACUTE ONLY): Fair ELOS: 19-21 days   Today's Date: 06/13/2017 OT Individual Time: 6599-3570 OT Individual Time Calculation (min): 70 min     Problem List:  Patient Active Problem List   Diagnosis Date Noted  . 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 Neurologic deficit due to acute ischemic stroke ([place])   . Hypokalemia   . Chronic diastolic congestive heart failure ([place])   . AVM (arteriovenous malformation)   . CVA (cerebral vascular accident) ([place]) 06/09/2017  . Multiple pulmonary nodules 06/09/2017  . Hypothermia 10/22/2016  . Chronic diastolic CHF (congestive heart failure) ([place]) 10/22/2016  . Abdominal pain, unspecified site 05/17/2013  . Nausea alone 05/17/2013  . 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 Abdominal pain, left lower quadrant 05/28/2011  . Cecal ulcer with hemorrhage 01/10/2011  . Acute blood loss anemia 12/06/2010  . Hematochezia 12/05/2010  . Anemia 12/05/2010  . Adrenal insufficiency ([place]) 12/05/2010  . Osteoporosis 12/05/2010  . LUNG INVOLVEMENT OTHER DISEASES CLASSIFIED ELSW 08/13/2009  . OTHER SPECIFIED DISORDER OF STOMACH AND DUODENUM 07/06/2008  . Essential hypertension 04/26/2007  . ALLERGIC RHINITIS 04/26/2007  . GASTROESOPHAGEAL REFLUX DISEASE 04/26/2007  . Polymyositis ([place]) 04/26/2007  . COLONIC POLYPS, ADENOMATOUS 02/06/2005    Past Medical History:  Past Medical History:  Diagnosis Date  . Allergic rhinitis, cause unspecified   . Benign neoplasm of colon   . Esophageal reflux   . Excessive daytime sleepiness   . 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 GI hemorrhage 2011   recurrent  . Hyperlipidemia   .  Osteoporosis   . Polymyositis ([place])   . Unspecified essential hypertension   . Vitamin D deficiency    Past Surgical History:  Past Surgical History:  Procedure Laterality Date  . ABDOMINAL HYSTERECTOMY     partial  . bilateral  rotator cuff surgery    . COLONOSCOPY  01/09/2011   others also  . COLONOSCOPY  05/29/2011   Procedure: COLONOSCOPY;  Surgeon: Gatha Mayer, MD;  Location: [facility];  Service: Endoscopy;  Laterality: N/A;  Greggory Brandy Carlean Purl  . COLONOSCOPY WITH PROPOFOL N/A 02/16/2014   Procedure: COLONOSCOPY WITH PROPOFOL;  Surgeon: Milus Banister, MD;  Location: [facility];  Service: Endoscopy;  Laterality: N/A;  . ESOPHAGOGASTRODUODENOSCOPY  12/08/2010   others also  . ESOPHAGOGASTRODUODENOSCOPY (EGD) WITH PROPOFOL N/A 02/16/2014   Procedure: ESOPHAGOGASTRODUODENOSCOPY (EGD) WITH PROPOFOL;  Surgeon: Milus Banister, MD;  Location: [facility];  Service: Endoscopy;  Laterality: N/A;  . EUS    . HOT HEMOSTASIS  05/29/2011   Procedure: HOT HEMOSTASIS (ARGON PLASMA COAGULATION/BICAP);  Surgeon: Gatha Mayer, MD;  Location: [facility];  Service: Endoscopy;  Laterality: N/A;  . LUMBAR LAMINECTOMY    . TONSILLECTOMY  age 77    Assessment & Plan Clinical Impression: Patient is a 77 y.o. year old female with history of hypertension, polymyositis, diastolic congestive heart failure, colonic AVM. Presented to [facility] room 06/09/2017 with complaints of generalized numbness and weakness. At baseline patient is essentially wheelchair-bound and has chronic bilateral upper extremity weakness attributed to polymyositis. Son would help with bed mobility and transfers. One level apartment with level entry. Son works during the day but would help his mother get up  in the mornings and get her to the power chair before leaving for work. CT of head reviewed, showing anterior capsule infarction. Per report, CT/MRI showed patchy acute nonhemorrhagic infarct left frontoparietal MCA  territories. Subacute subcentimeter left thalamus infarction as well as old right basal ganglia and old right thalamus and small infarcts. MRI cervical spine negative. Patient did not receive TPA. CT angiogram of head and neck with no emergent large vessel occlusion. Incidental findings of multiple right upper lobe pulmonary nodules and recommendations of CT of chest 3 to 6 months. Echocardiogram with ejection fraction of 50% grade 1 diastolic dysfunction. Currently maintained on aspirin and Plavix for CVA prophylaxis x3 months. Subcutaneous Lovenox for DVT prophylaxis. Patient developed upper respiratory infection. Chest x-ray showed no evidence of active pulmonary disease. Started on Augmentin 06/10/2017. Tolerating a regular diet. Physical and occupational therapy evaluations completed with recommendations of physical medicine rehab consult. Patient was admitted for a comprehensive rehab program. Patient transferred to CIR on 06/12/2017 .    Patient currently requires total with basic self-care skills secondary to muscle weakness, decreased cardiorespiratoy endurance, decreased coordination, decreased safety awareness and decreased sitting balance, decreased standing balance and hemiplegia.  Prior to hospitalization, patient could complete ADLs with mod.  Patient will benefit from skilled intervention to decrease level of assist with basic self-care skills prior to discharge home with care partner.  Anticipate patient will require 24 hour supervision and moderate physical assestance and follow up home health.  OT - End of Session Activity Tolerance: Decreased this session Endurance Deficit: Yes Endurance Deficit Description: decreased OT Assessment Rehab Potential (ACUTE ONLY): Fair OT Barriers to Discharge: Other (comments) OT Barriers to Discharge Comments: caregiver is legally blind OT Patient demonstrates impairments in the following area(s): Balance;Endurance;Motor;Pain;Safety OT  Basic ADL's Functional Problem(s): Grooming;Bathing;Dressing;Toileting;Eating OT Transfers Functional Problem(s): Toilet OT Additional Impairment(s): Fuctional Use of Upper Extremity OT Plan OT Intensity: Minimum of 1-2 x/day, 45 to 90 minutes OT Frequency: 5 out of 7 days OT Duration/Estimated Length of Stay: 19-21 days OT Treatment/Interventions: Balance/vestibular training;Neuromuscular re-education;Self Care/advanced ADL retraining;Therapeutic Exercise;Wheelchair propulsion/positioning;Cognitive remediation/compensation;DME/adaptive equipment instruction;Pain management;UE/LE Strength taining/ROM;Community reintegration;Patient/family education;UE/LE Coordination activities;Discharge planning;Functional mobility training;Psychosocial support;Therapeutic Activities OT Self Feeding Anticipated Outcome(s): mod A OT Basic Self-Care Anticipated Outcome(s): min - mod A OT Toileting Anticipated Outcome(s): mod A OT Bathroom Transfers Anticipated Outcome(s): mod A OT Recommendation Recommendations for Other Services: Other (comment)(none at this time) Patient destination: Home Follow Up Recommendations: 24 hour supervision/assistance;Home health OT Equipment Recommended: To be determined   Skilled Therapeutic Intervention Upon entering the room, pt supine in bed and requesting assistance with breakfast. Pt required max A for supine >sit for trunk and R LE. Pt seated on EOB with overall close supervision with static sitting balance. Pt required hand over hand assistance to utilize utensils, scoop, and bring food to mouth. Pt leaning forwards to attempt to use straw for drinking but requires steady assistance for balance. Pt returning to supine secondary to fatigue. Hand over hand assistance to wash face while supine. Attempts made with L and R UE this session for functional tasks. OT educated pt on OT purpose, POC, and goals with pt verbalizing understanding.   OT  Evaluation Precautions/Restrictions  Precautions Precautions: Fall Type of Shoulder Precautions: R shoulder weakness and subluxation most recent stroke Restrictions Weight Bearing Restrictions: No Vital Signs Therapy Vitals Temp: (!) 97.5 F (36.4 C) Pulse Rate: 64 BP: (!) 175/72 Patient Position (if appropriate): Sitting Oxygen Therapy SpO2: 98 % O2 Device: Room Air Pain  Pain Assessment Pain Score: 0-No pain Home Living/Prior Functioning Home Living Family/patient expects to be discharged to:: Private residence Living Arrangements: Children Available Help at Discharge: Family Type of Home: Apartment Home Access: Level entry Home Layout: One level Bathroom Shower/Tub: Optometrist: Yes Additional Comments: Accessible via power chair  Lives With: Son Prior Function Level of Independence: Requires assistive device for independence, Needs assistance with homemaking, Needs assistance with tranfers  Able to Take Stairs?: No Driving: No Vocation: Unemployed Comments: Son helps pt get OOB in morning and up to power chair, then he would leave for work. Pt using bathroom in brief prior to son leaving and getting changed. "pan" bath from bed level. Pt reports she could dress herself before. Vision Baseline Vision/History: Wears glasses Wears Glasses: Reading only Patient Visual Report: No change from baseline Vision Assessment?: No apparent visual deficits Cognition Overall Cognitive Status: No family/caregiver present to determine baseline cognitive functioning Arousal/Alertness: Awake/alert Orientation Level: Person;Place;Situation Person: Oriented Place: Oriented Situation: Oriented Year: 2019 Month: June Day of Week: Correct Memory: Appears intact Immediate Memory Recall: Sock;Blue;Bed Memory Recall: Sock;Blue;Bed Memory Recall Sock: Without Cue Memory Recall Blue: With Cue Memory Recall Bed: With  Cue Safety/Judgment: Appears intact Sensation Sensation Light Touch: Impaired by gross assessment Proprioception: Appears Intact;Impaired by gross assessment Coordination Gross Motor Movements are Fluid and Coordinated: No Fine Motor Movements are Fluid and Coordinated: No Motor  Motor Motor: Tetraplegia;Other (comment) Motor - Skilled Clinical Observations: BLE weakness, RUE weakness, generalized deconditioning Mobility  Bed Mobility Bed Mobility: Rolling Right;Rolling Left;Supine to Sit;Sit to Supine Rolling Right: 2: Max assist Rolling Right Details: Manual facilitation for placement;Manual facilitation for weight bearing;Manual facilitation for weight shifting;Verbal cues for technique;Verbal cues for precautions/safety;Tactile cues for initiation Rolling Left: 2: Max assist Supine to Sit: 2: Max assist Supine to Sit Details: Verbal cues for precautions/safety;Manual facilitation for placement;Manual facilitation for weight shifting;Manual facilitation for weight bearing;Verbal cues for technique;Tactile cues for initiation Sit to Supine: 2: Max assist Sit to Supine - Details: Tactile cues for initiation;Manual facilitation for weight bearing;Verbal cues for technique;Manual facilitation for weight shifting;Verbal cues for precautions/safety;Manual facilitation for placement  Trunk/Postural Assessment  Cervical Assessment Cervical Assessment: Exceptions to WFL(forward head) Thoracic Assessment Thoracic Assessment: Exceptions to [facility] Lumbar Assessment Lumbar Assessment: Exceptions to WFL(posterior pelvic tilt) Postural Control Postural Control: Deficits on evaluation  Balance Balance Balance Assessed: Yes Static Sitting Balance Static Sitting - Balance Support: No upper extremity supported;Feet supported Static Sitting - Level of Assistance: 5: Stand by assistance Dynamic Sitting Balance Dynamic Sitting - Balance Support: No upper extremity supported;Feet supported Dynamic  Sitting - Level of Assistance: 4: Min assist Extremity/Trunk Assessment RUE Assessment RUE Assessment: Exceptions to WFL(trace movement throughout) LUE Assessment LUE Assessment: Exceptions to WFL(2+/5 throughout, shoulder sublux and unable to really functionally use)   See Function Navigator for Current Functional Status.   Refer to Care Plan for Long Term Goals  Recommendations for other services: None    Discharge Criteria: Patient will be discharged from OT if patient refuses treatment 3 consecutive times without medical reason, if treatment goals not met, if there is a change in medical status, if patient makes no progress towards goals or if patient is discharged from hospital.  The above assessment, treatment plan, treatment alternatives and goals were discussed and mutually agreed upon: by patient  Gypsy Decant 06/13/2017, 4:52 PM

## 2017-06-14 ENCOUNTER — Inpatient Hospital Stay (HOSPITAL_COMMUNITY): Payer: Medicare Other

## 2017-06-14 MED ORDER — ENOXAPARIN SODIUM 40 MG/0.4ML ~~LOC~~ SOLN
40.0000 mg | SUBCUTANEOUS | Status: DC
  Administered 2017-06-15 – 2017-06-25 (×10): 40 mg via SUBCUTANEOUS
  Filled 2017-06-14 (×10): qty 0.4

## 2017-06-14 MED ORDER — ALUM & MAG HYDROXIDE-SIMETH 200-200-20 MG/5ML PO SUSP
30.0000 mL | Freq: Two times a day (BID) | ORAL | Status: DC | PRN
  Administered 2017-06-14 – 2017-06-24 (×5): 30 mL via ORAL
  Filled 2017-06-14 (×5): qty 30

## 2017-06-14 NOTE — Progress Notes (Signed)
[facility] PHYSICAL MEDICINE & REHABILITATION     PROGRESS NOTE  Subjective/Complaints:  Patient seen lying in bed this morning. She states she slept well overnight. She states she had a good first in therapies yesterday.  ROS: denies CP, S OB, nausea, vomiting, diarrhea.  Objective: Vital Signs: Blood pressure 111/64, pulse 83, temperature 99 F (37.2 C), temperature source Oral, resp. rate 18, height 5\' 3"  (1.6 m), weight 77.1 kg (170 lb), SpO2 97 %. No results found. Recent Labs    06/13/17 1558  WBC 7.9  HGB 10.3*  HCT 33.7*  PLT 290   Recent Labs    06/13/17 1558  CREATININE <0.30*   CBG (last 3)  No results for input(s): GLUCAP in the last 72 hours.  Wt Readings from Last 3 Encounters:  06/12/17 77.1 kg (170 lb)  06/09/17 77.1 kg (170 lb)  05/15/17 77.1 kg (170 lb)    Physical Exam:  BP 111/64 (BP Location: Left Arm)   Pulse 83   Temp 99 F (37.2 C) (Oral)   Resp 18   Ht 5\' 3"  (1.6 m)   Wt 77.1 kg (170 lb)   SpO2 97%   BMI 30.11 kg/m  Constitutional: She appearswell-developed.  Obese. HENT: Poor dentition.  Normocephalic.  Atraumatic.   Eyes:EOMI.  No discharge.   Cardiovascular:RRR. No JVD. Respiratory:Effort normal.  Clear. GI: She exhibitsno distension.  Bowel sounds normal.   Musculoskeletal: No edema or tenderness in extremities  Neurological: She isalertand oriented. Dysarthria.  Motor: RUE 1/5 deltoid, biceps, triceps.  RLE: 2+/5  hip flexion, knee extension, 4+/5 ankle dorsiflexion.    Assessment/Plan: 1. 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 Physiatrist is providing close team supervision and 24 hour management of active medical problems listed below. Physiatrist and rehab team continue to assess barriers to discharge/monitor patient progress toward functional and medical  goals.  Function:  Bathing Bathing position      Bathing parts   Body parts bathed by helper: Right arm, Left arm, Chest, Abdomen, Front perineal area, Buttocks, Right upper leg, Left upper leg, Right lower leg, Left lower leg, Back  Bathing assist Assist Level: (total A)      Upper Body Dressing/Undressing Upper body dressing   What is the patient wearing?: Hospital gown                Upper body assist Assist Level: (total A)      Lower Body Dressing/Undressing Lower body dressing   What is the patient wearing?: Non-skid slipper socks           Non-skid slipper socks- Performed by helper: Don/doff right sock, Don/doff left sock                  Lower body assist        Toileting Toileting Toileting activity did not occur: No continent bowel/bladder event        Toileting assist     Transfers Chair/bed transfer   Chair/bed transfer method: Lateral scoot Chair/bed transfer assist level: 2 helpers Chair/bed transfer assistive device: Sliding board, Armrests     Locomotion Ambulation Ambulation activity did not occur: Safety/medical concerns         Wheelchair   Type: Motorized   Assist Level: Dependent (Pt equals 0%)  Cognition Comprehension Comprehension assist level: Follows basic conversation/direction with no assist  Expression Expression assist  level: Expresses basic 90% of the time/requires cueing < 10% of the time.  Social Interaction Social Interaction assist level: Interacts appropriately 90% of the time - Needs monitoring or encouragement for participation or interaction.  Problem Solving Problem solving assist level: Solves basic 75 - 89% of the time/requires cueing 10 - 24% of the time  Memory Memory assist level: Recognizes or recalls 90% of the time/requires cueing < 10% of the time    Medical Problem List and Plan: 1.Decreased functional mobilitysecondary to left frontoparietal MCA infarction on 6/00 complicated by history  of polymyositis  Continue CIR 2. DVT Prophylax notes reviewed, labs reviewed, is/Anticoagulation: Subcutaneous Lovenox. Monitor for any bleeding episodes 3. Pain Management:Neurontin 100 mg twice daily Tylenol as needed 4. Mood:Xanax 0.25 mg twice daily as needed 5. Neuropsych: This patientiscapable of making decisions on herown behalf. 6. Skin/Wound Care:Routine skin checks 7. Fluids/Electrolytes/Nutrition:Routine in and outs   BMP within acceptable range on 5/28  Labs pending 8.Polymyositis. Continue CellCept 500 mg twice daily. Patient has been wheelchair-bound for approximately the past 8 months 9.Chronic diastolic congestive heart failure. Monitor for any signs of fluid overload Filed Weights   06/12/17 1842  Weight: 77.1 kg (170 lb)   Daily weights ordered 10.Hypertension. Tenormin 50 mg daily, Lasix 40 mg daily prior to admission.   Relatively controlled on 6/2 11.Pulmonary nodules. Incidental finding on CTA of neck. Noncontrast CT 3 to 6 months 12.Hyperlipidemia. Lipitor 13.  Acute blood loss anemia   Hemoglobin 10.3 on 6/1   Continue to monitor  LOS (Days) 2 A FACE TO FACE EVALUATION WAS PERFORMED  Ankit Lorie Phenix 06/14/2017 7:31 AM

## 2017-06-14 NOTE — Progress Notes (Signed)
Physical Therapy Session Note  Patient Details  Name: [name] [name] MRN: [id number] Date of Birth: 1940-10-16  Today's Date: 06/14/2017 PT Individual Time: 0800-0911 PT Individual Time Calculation (min): 71 min   Short Term Goals: Week 1:  PT Short Term Goal 1 (Week 1): Pt will perform bed mobility w/ mod assist PT Short Term Goal 2 (Week 1): Pt will perform sit<>stand w/ max assist x2 for LE strengthening PT Short Term Goal 3 (Week 1): Pt will maintain dynamic sitting balance w/ supervision PT Short Term Goal 4 (Week 1): Pt will perform bed<>chair transfer w/ max assist x1  Skilled Therapeutic Interventions/Progress Updates:    Pt supine in bed upon PT arrival, agreeable to therapy tx and denies pain. Pt reports feeling "bad and bloated" this session. Pt performed rolling in each direction to don pants with total assist. Pt performed rolling in each direction x 2 with emphasis on techniques for NMR, max assist. Pt transferred from supine>sittting total assist. Pt performed anterior/posteior scooting for NMR with max assist and manual facilitation for weightshifting. Pt worked on seated balance with stand by assist while discussing plan for this session, pt reports feeling nervous. Pt performed Sitting<>sidelying on elbow x 5 each side with min assist when leaning on L elbow and mod assist for R elbow. Pt performed slideboard transfer from bed>w/c total assist, manual facilitation for weightshifting. Pt transported to the gym. Pt performed LE therex including ankle pumps, marches, LAQ and hip abduction with theraband. Therapist perform manual hamstring stretches bilaterally 2 x 1 min. Pt performed AAROM for B elbow flexion x 10. Pt transported back to room and transferred back to bed with slideboard total assist. Pt left supine in bed with needs in reach.  Therapy Documentation Precautions:  Precautions Precautions: Fall Type of Shoulder Precautions: R shoulder weakness and subluxation most  recent stroke Restrictions Weight Bearing Restrictions: No   See Function Navigator for Current Functional Status.   Therapy/Group: Individual Therapy  Netta Corrigan, PT, DPT 06/14/2017, 7:49 AM

## 2017-06-14 NOTE — IPOC Note (Signed)
Overall Plan of Care [facility]) Patient Details Name: [name] [name] MRN: [id number] DOB: [birth year]  Admitting Diagnosis: <principal problem not specified>  Hospital Problems: Active Problems:   Left middle cerebral artery stroke ([place])   Neuropathic pain     Functional Problem List: Nursing Bladder, Bowel, Nutrition, Safety  PT Balance, Behavior, Endurance, Motor, Safety  OT Balance, Endurance, Motor, Pain, Safety  SLP    TR         Basic ADL's: OT Grooming, Bathing, Dressing, Toileting, Eating     Advanced  ADL's: OT       Transfers: PT Bed Mobility, Bed to Chair, Car, Furniture, Floor  OT Toilet     Locomotion: PT Emergency planning/management officer, Ambulation, Stairs     Additional Impairments: OT Fuctional Use of Upper Extremity  SLP Swallowing, Social Cognition   Problem Solving, Awareness  TR      Anticipated Outcomes Item Anticipated Outcome  Self Feeding mod A  Swallowing  Mod I   Basic self-care  min - mod A  Toileting  mod A   Bathroom Transfers mod A  Bowel/Bladder  Continue to be continent of bowel and bladder during admission  Transfers  Mod assist  Locomotion  Modified independent at w/c level  Communication     Cognition  Mod I  Pain  Patient will be pain free or pain less than 3  Safety/Judgment  Patient will be free from fall during admission   Therapy Plan: PT Intensity: Minimum of 1-2 x/day ,45 to 90 minutes PT Frequency: 5 out of 7 days PT Duration Estimated Length of Stay: 19-21 days OT Intensity: Minimum of 1-2 x/day, 45 to 90 minutes OT Frequency: 5 out of 7 days OT Duration/Estimated Length of Stay: 19-21 days SLP Intensity: Minumum of 1-2 x/day, 30 to 90 minutes SLP Frequency: 3 to 5 out of 7 days SLP Duration/Estimated Length of Stay: 19-21 days     Team Interventions: Nursing Interventions Patient/Family Education, Medication Management, Dysphagia/Aspiration Precaution Training, Disease Management/Prevention  PT interventions  Visual/perceptual remediation/compensation, Pain management, Disease management/prevention, Training and development officer, Ambulation/gait training, DME/adaptive equipment instruction, Patient/family education, Therapeutic Activities, Wheelchair propulsion/positioning, Therapeutic Exercise, Psychosocial support, Cognitive remediation/compensation, Functional electrical stimulation, Community reintegration, Functional mobility training, Skin care/wound management, UE/LE Strength taining/ROM, UE/LE Coordination activities, Splinting/orthotics, Neuromuscular re-education, Discharge planning  OT Interventions Balance/vestibular training, Neuromuscular re-education, Self Care/advanced ADL retraining, Therapeutic Exercise, Wheelchair propulsion/positioning, Cognitive remediation/compensation, DME/adaptive equipment instruction, Pain management, UE/LE Strength taining/ROM, Community reintegration, Barrister's clerk education, UE/LE Coordination activities, Discharge planning, Functional mobility training, Psychosocial support, Therapeutic Activities  SLP Interventions Cognitive remediation/compensation, Cueing hierarchy, Dysphagia/aspiration precaution training, Functional tasks, Patient/family education  TR Interventions    SW/CM Interventions     Barriers to Discharge MD  Medical stability  Nursing      PT Decreased caregiver support son works during the day, may not be able to provide enough physical assistance at d/c as well  OT Other (comments) caregiver is legally blind  SLP      SW       Team Discharge Planning: Destination: PT-Home ,OT- Home , SLP-Home Projected Follow-up: PT-Home health PT, OT-  24 hour supervision/assistance, Home health OT, SLP-None Projected Equipment Needs: PT-To be determined, OT- To be determined, SLP-None recommended by SLP Equipment Details: PT-Has power chair, may need new one in light of recent stroke, OT-  Patient/family involved in discharge planning: PT- Patient,   OT-Patient, SLP-Patient, Family member/caregiver  MD ELOS: 17-21d Medical Rehab Prognosis:  Good Assessment:   77 year old right-handed  female with history of hypertension, polymyositis, diastolic congestive heart failure, colonic AVM. Presented to [facility] emergency room 06/09/2017 with complaints of generalized numbness and weakness. At baseline patient is essentially wheelchair-bound and has chronic bilateral upper extremity weakness attributed to polymyositis. Son would help with bed mobility and transfers. One level apartment with level entry. Son works during the day but would help his mother get up in the mornings and get her to the power chair before leaving for work. CT of head reviewed, showing anterior capsule infarction. 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 Subacute subcentimeter left thalamus infarction as well as old right basal ganglia and old right thalamus and small infarcts. MRI cervical spine negative. Patient did not receive TPA. CT angiogram of head and neck with no emergent large vessel occlusion. Incidental findings of multiple right upper lobe pulmonary nodules and recommendations of CT of chest 3 to 6 months. Echocardiogram with ejection fraction of 93% grade 1 diastolic dysfunction. Currently maintained on aspirin and Plavix for CVA prophylaxis x3 months. Subcutaneous Lovenox for DVT prophylaxis. Patient developed upper respiratory infection. Chest x-ray showed no evidence of active pulmonary disease. Started on Augmentin 06/10/2017.   Now requiring 24/7 Rehab RN,MD, as well as CIR level PT, OT and SLP.  Treatment team will focus on ADLs and mobility with goals set at Mod A See Team Conference Notes for weekly updates to the plan of care

## 2017-06-15 ENCOUNTER — Inpatient Hospital Stay (HOSPITAL_COMMUNITY): Payer: Medicare Other | Admitting: Physical Therapy

## 2017-06-15 ENCOUNTER — Inpatient Hospital Stay (HOSPITAL_COMMUNITY): Payer: Medicare Other

## 2017-06-15 ENCOUNTER — Inpatient Hospital Stay (HOSPITAL_COMMUNITY): Payer: Medicare Other | Admitting: Occupational Therapy

## 2017-06-15 LAB — COMPREHENSIVE METABOLIC PANEL
ALK PHOS: 51 U/L (ref 38–126)
ALT: 14 U/L (ref 14–54)
AST: 24 U/L (ref 15–41)
Albumin: 3.2 g/dL — ABNORMAL LOW (ref 3.5–5.0)
Anion gap: 11 (ref 5–15)
BILIRUBIN TOTAL: 0.9 mg/dL (ref 0.3–1.2)
BUN: 13 mg/dL (ref 6–20)
CALCIUM: 8.9 mg/dL (ref 8.9–10.3)
CO2: 30 mmol/L (ref 22–32)
CREATININE: 0.68 mg/dL (ref 0.44–1.00)
Chloride: 101 mmol/L (ref 101–111)
GFR calc non Af Amer: >60 mL/min (ref >60)
Glucose, Bld: 121 mg/dL — ABNORMAL HIGH (ref 65–99)
Potassium: 3 mmol/L — ABNORMAL LOW (ref 3.5–5.1)
SODIUM: 142 mmol/L (ref 135–145)
TOTAL PROTEIN: 5.8 g/dL — AB (ref 6.5–8.1)

## 2017-06-15 LAB — CBC WITH DIFFERENTIAL/PLATELET
ABS IMMATURE GRANULOCYTES: 0.1 10*3/uL (ref 0.0–0.1)
BASOS ABS: 0 10*3/uL (ref 0.0–0.1)
Basophils Relative: 1 %
EOS PCT: 6 %
Eosinophils Absolute: 0.4 10*3/uL (ref 0.0–0.7)
HEMATOCRIT: 33.3 % — AB (ref 36.0–46.0)
HEMOGLOBIN: 10.1 g/dL — AB (ref 12.0–15.0)
Immature Granulocytes: 1 %
LYMPHS ABS: 2 10*3/uL (ref 0.7–4.0)
LYMPHS PCT: 33 %
MCH: 25.3 pg — AB (ref 26.0–34.0)
MCHC: 30.3 g/dL (ref 30.0–36.0)
MCV: 83.5 fL (ref 78.0–100.0)
MONOS PCT: 9 %
Monocytes Absolute: 0.5 10*3/uL (ref 0.1–1.0)
NEUTROS ABS: 3.1 10*3/uL (ref 1.7–7.7)
Neutrophils Relative %: 50 %
Platelets: 278 10*3/uL (ref 150–400)
RBC: 3.99 MIL/uL (ref 3.87–5.11)
RDW: 17.3 % — ABNORMAL HIGH (ref 11.5–15.5)
WBC: 6.1 10*3/uL (ref 4.0–10.5)

## 2017-06-15 MED ORDER — ALPRAZOLAM 0.5 MG PO TABS
0.5000 mg | ORAL_TABLET | Freq: Two times a day (BID) | ORAL | Status: DC | PRN
Start: 2017-06-15 — End: 2017-06-25
  Administered 2017-06-15 – 2017-06-24 (×10): 0.5 mg via ORAL
  Filled 2017-06-15 (×11): qty 1

## 2017-06-15 MED ORDER — SENNOSIDES-DOCUSATE SODIUM 8.6-50 MG PO TABS
2.0000 | ORAL_TABLET | Freq: Two times a day (BID) | ORAL | Status: DC
  Administered 2017-06-15 – 2017-06-21 (×11): 2 via ORAL
  Filled 2017-06-15 (×15): qty 2

## 2017-06-15 MED ORDER — POTASSIUM CHLORIDE CRYS ER 20 MEQ PO TBCR
40.0000 meq | EXTENDED_RELEASE_TABLET | Freq: Every day | ORAL | Status: DC
  Administered 2017-06-15 – 2017-06-25 (×11): 40 meq via ORAL
  Filled 2017-06-15 (×11): qty 2

## 2017-06-15 NOTE — Progress Notes (Signed)
Speech Language Pathology Daily Session Note  Patient Details  Name: [name] [name] MRN: [id number] Date of Birth: Aug 05, 1940  Today's Date: 06/15/2017 SLP Individual Time: 0102-7253 SLP Individual Time Calculation (min): 43 min  Short Term Goals: Week 1: SLP Short Term Goal 1 (Week 1): Pt will consume regular textured trials with efficitent mastication and no overt aspiration to demonstrate readiness for diet advancement. SLP Short Term Goal 2 (Week 1): Pt will demonstrate functional problem solving for familar mildly complex tasks with supervision A verbal and question cues. SLP Short Term Goal 3 (Week 1): Pt will identitfy 3 activties pt can and can not participate in at home due to current physical deficits given supervision A verbal cues,   Skilled Therapeutic Interventions:Skilled ST services focused on cognitive skills. SLP facilitated personal medication management providing medication list and educating pt on current medications, pt required supervision A for initial medication management, however needs to be completed at next session. Pt continues to demonstrate reduced insight into recent deficit of RUE and it's impact on daily living with question scenarios requiring mod-min A verbal cues. Pt was left in room with call bell within reach. Recommend to continue skilled ST services.     Function:  Eating Eating                 Cognition Comprehension Comprehension assist level: Follows basic conversation/direction with extra time/assistive device  Expression   Expression assist level: Expresses basic needs/ideas: With extra time/assistive device  Social Interaction Social Interaction assist level: Interacts appropriately 75 - 89% of the time - Needs redirection for appropriate language or to initiate interaction.  Problem Solving Problem solving assist level: Solves complex 90% of the time/cues < 10% of the time  Memory Memory assist level: Recognizes or recalls 90% of the  time/requires cueing < 10% of the time    Pain Pain Assessment Pain Score: 0-No pain  Therapy/Group: Individual Therapy  [name] [name]  [facility] 06/15/2017, 2:50 PM

## 2017-06-15 NOTE — Progress Notes (Signed)
Social Work  Social Work Assessment and Plan  Patient Details  Name: [name] [name] MRN: [id number] Date of Birth: 26-Jan-1940  Today's Date: 06/15/2017  Problem List:  Patient Active Problem List   Diagnosis Date Noted  . Neuropathic pain   . Left middle cerebral artery stroke ([place]) 06/12/2017  . Neurologic deficit due to acute ischemic stroke ([place])   . Hypokalemia   . Chronic diastolic congestive heart failure ([place])   . AVM (arteriovenous malformation)   . CVA (cerebral vascular accident) ([employer]) 06/09/2017  . Multiple pulmonary nodules 06/09/2017  . Hypothermia 10/22/2016  . Chronic diastolic CHF (congestive heart failure) ([place]) 10/22/2016  . Abdominal pain, unspecified site 05/17/2013  . Nausea alone 05/17/2013  . 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 Abdominal pain, left lower quadrant 05/28/2011  . Cecal ulcer with hemorrhage 01/10/2011  . Acute blood loss anemia 12/06/2010  . Hematochezia 12/05/2010  . Anemia 12/05/2010  . Adrenal insufficiency ([place]) 12/05/2010  . Osteoporosis 12/05/2010  . LUNG INVOLVEMENT OTHER DISEASES CLASSIFIED ELSW 08/13/2009  . OTHER SPECIFIED DISORDER OF STOMACH AND DUODENUM 07/06/2008  . Essential hypertension 04/26/2007  . ALLERGIC RHINITIS 04/26/2007  . GASTROESOPHAGEAL REFLUX DISEASE 04/26/2007  . Polymyositis ([place]) 04/26/2007  . COLONIC POLYPS, ADENOMATOUS 02/06/2005   Past Medical History:  Past Medical History:  Diagnosis Date  . Allergic rhinitis, cause unspecified   . Benign neoplasm of colon   . Esophageal reflux   . Excessive daytime sleepiness   . 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 GI hemorrhage 2011   recurrent  . Hyperlipidemia   . Osteoporosis   . Polymyositis ([place])   . Unspecified essential hypertension   . Vitamin D deficiency    Past Surgical History:  Past Surgical History:  Procedure Laterality Date  . ABDOMINAL HYSTERECTOMY     partial  . bilateral  rotator cuff  surgery    . COLONOSCOPY  01/09/2011   others also  . COLONOSCOPY  05/29/2011   Procedure: COLONOSCOPY;  Surgeon: Gatha Mayer, MD;  Location: [facility] ENDOSCOPY;  Service: Endoscopy;  Laterality: N/A;  Greggory Brandy Carlean Purl  . COLONOSCOPY WITH PROPOFOL N/A 02/16/2014   Procedure: COLONOSCOPY WITH PROPOFOL;  Surgeon: Milus Banister, MD;  Location: [facility] ENDOSCOPY;  Service: Endoscopy;  Laterality: N/A;  . ESOPHAGOGASTRODUODENOSCOPY  12/08/2010   others also  . ESOPHAGOGASTRODUODENOSCOPY (EGD) WITH PROPOFOL N/A 02/16/2014   Procedure: ESOPHAGOGASTRODUODENOSCOPY (EGD) WITH PROPOFOL;  Surgeon: Milus Banister, MD;  Location: [facility] ENDOSCOPY;  Service: Endoscopy;  Laterality: N/A;  . EUS    . HOT HEMOSTASIS  05/29/2011   Procedure: HOT HEMOSTASIS (ARGON PLASMA COAGULATION/BICAP);  Surgeon: Gatha Mayer, MD;  Location: [facility];  Service: Endoscopy;  Laterality: N/A;  . LUMBAR LAMINECTOMY    . TONSILLECTOMY  age 54   Social History:  reports that she has never smoked. She has quit using smokeless tobacco. Her smokeless tobacco use included snuff. She reports that she does not drink alcohol or use drugs.  Family / Support Systems Marital Status: Divorced Patient Roles: Parent Children: Gerhard Munch [phone]   Sandrekia-daughter [phone] Other Supports: [place] aide who comes T & Thurs Anticipated Caregiver: Son and daughter Ability/Limitations of Caregiver: Son works three days per week from 5am-6pm, he is blind but was assisting pt prior to admission Caregiver Availability: Other (Comment)(Son and daughter will need to work on 24 hr care) Family Dynamics: Close with her two local children-son and daughter and she has another son  and daughter in the [place] area that come and visit her on the weekends. They rely upon one another and feel this works for them.   Social History Preferred language: English Religion: Presbyterian Cultural Background: No issues Education: Secretary/administrator educated Read: Yes Write:  Yes Employment Status: Retired Freight forwarder Issues: No issues Guardian/Conservator: none-according to MD pt is capable of making her own decisions while here   Abuse/Neglect Abuse/Neglect Assessment Can Be Completed: Yes Physical Abuse: Denies Verbal Abuse: Denies Sexual Abuse: Denies Exploitation of patient/patient's resources: Denies Self-Neglect: Denies  Emotional Status Pt's affect, behavior adn adjustment status: Pt is feeling her hands are the main issue this time and she will need to work on this, due to she needed assist with her transfers and used a power chair prior to admission. She would stay at home alone while son worked and would hold her urine which she felt was not a big issue. Recent Psychosocial Issues: required assist prior to admission and used a power chair.  Pyschiatric History: No history deferred depression due to feel she is doing well. This worker feels she would benefit from seeing neuro-psych while here due to multiple issues and stressors in her life. Substance Abuse History: She dips snuff and does not plan to quit this  Patient / Family Perceptions, Expectations & Goals Pt/Family understanding of illness & functional limitations: Pt and son can explain her medical issues and loss of some function in her hand. She wants to be able to for herself and has spoken with the MD about her treatment plan and lack of sleeping. He is working with her on medication adjustment. Premorbid pt/family roles/activities: Mom, retiree, Designer, fashion/clothing, church memeber,etc Anticipated changes in roles/activities/participation: resume Pt/family expectations/goals: Pt states: " I want to get back home and be moving my hands better." Son states: " I feel she is getting back to the level she was before coming into the hospital."  [employer]: Other (Comment)(Aero-Aide T & TH 4hrs per day & Mobile Meals) Premorbid Home Care/DME Agencies: Other  (Comment)(has a power chair and RW) Transportation available at discharge: [facility] uses for appointments Resource referrals recommended: Neuropsychology, Support group (specify)  Discharge Planning Living Arrangements: Children Support Systems: Children Type of Residence: Private residence Insurance Resources: Multimedia programmer (specify)(UHC_Medicare) Financial Resources: Radio broadcast assistant Screen Referred: No Living Expenses: Rent Money Management: Patient Does the patient have any problems obtaining your medications?: No Home Management: Physiological scientist does the cleaning and she and son cook but also has mobile meals Patient/Family Preliminary Plans: Return home with son who works three days a week. Made aware the team would recommend 24 hr care for safety and for her care. Will work with son and daughter to see if a plan for more care can be worked out. Pt seems to feel she is safe and can do what she eneds to do for herself or can wait until son gets home. Sw Barriers to Discharge: Decreased caregiver support Sw Barriers to Discharge Comments: Does not have 24hr care Social Work Anticipated Follow Up Needs: HH/OP, Support Group  Clinical Impression Pleasant female who has made adjustments in her life to be able to stay at home with her son. Son has been assisting pt prior to admission with her transfers and mobility. She has all needed equipment and has many community resources-mobile meals and [facility] along with an aide two days a week. Will work with all on the best and safest plan for pt for discharge.  Elease Hashimoto 06/15/2017, 2:09 PM

## 2017-06-15 NOTE — Progress Notes (Signed)
C/O "gas", PRN maalox given at 2015, with "some" relief. At 2135, PRN xanax given per patient's request for insomnia. Same strength from home, but not effective last night. Denies pain, but grimaces when repositioned. Black area noted to tongue. Right porta cath accessed and dressing CD&I. Reports poor appetite. [name] [name] A

## 2017-06-15 NOTE — Progress Notes (Addendum)
Physical Therapy Session Note  Patient Details  Name: [name] [name] MRN: [id number] Date of Birth: 11-Jun-1940  Today's Date: 06/15/2017 PT Individual Time: 1455-1605 PT Individual Time Calculation (min): 70 min   Short Term Goals: Week 1:  PT Short Term Goal 1 (Week 1): Pt will perform bed mobility w/ mod assist PT Short Term Goal 2 (Week 1): Pt will perform sit<>stand w/ max assist x2 for LE strengthening PT Short Term Goal 3 (Week 1): Pt will maintain dynamic sitting balance w/ supervision PT Short Term Goal 4 (Week 1): Pt will perform bed<>chair transfer w/ max assist x1  Skilled Therapeutic Interventions/Progress Updates:  Pt received in w/c & agreeable to tx. Pt denied c/o pain. Spent time discussing PLOF & pt reports she has not ambulated in 7-8 years 2/2 multiple falls due to back issues. Pt reports prior to this hospital admission her son transferred her in/out of bed and bed<>chair, reporting "he did most of it" and if she had to do it it would "take me half a day to get out of bed" on her own. Pt reports PTA she would bath herself from bed level and could feed herself. Pt reports her son works in [place] during the day and he is legally blind and using walking stick for ambulation. Pt reports no one is home with her during the day but her daughter lives 3 blocks away but will be having a hip replacement in the near future, uses a [facility], and is unable to assist the pt with functional tasks. Informed LCSW (Becky) of need for pt's to son to come in for PT session as soon as possible to discuss pt's PLOF, CLOF, and goals for CIR. Pt reports she has a power chair with the control on the R side. With pt's permission therapist contacted [employer] and left message regarding need to switch control to L side and potential need for new cushion 2/2 pt's change in status. Pt reports she does not use the bathroom at all during the day, instead waiting until her son gets home at 6pm but reports need  to have BM & void during session. Pt returned to bed with maximove & +2 assist with +2 assist for rolling L/R as pt with limited use of LUE only. Pt left on bed pan with call bell in reach & bed alarm set.   Pt AxO x 4 with cuing to recall she had a "stroke".   Therapy Documentation Precautions:  Precautions Precautions: Fall Type of Shoulder Precautions: R shoulder weakness and subluxation most recent stroke Restrictions Weight Bearing Restrictions: No    See Function Navigator for Current Functional Status.   Therapy/Group: Individual Therapy  Waunita Schooner 06/15/2017, 4:26 PM

## 2017-06-15 NOTE — Discharge Instructions (Signed)
Inpatient Rehab Discharge Instructions  KEARA PAGLIARULO Discharge date and time: No discharge date for patient encounter.   Activities/Precautions/ Functional Status: Activity: activity as tolerated Diet: soft Wound Care: none needed Functional status:  ___ No restrictions     ___ Walk up steps independently ___ 24/7 supervision/assistance   ___ Walk up steps with assistance ___ Intermittent supervision/assistance  ___ Bathe/dress independently ___ Walk with walker     _x__ Bathe/dress with assistance ___ Walk Independently    ___ Shower independently ___ Walk with assistance    ___ Shower with assistance ___ No alcohol     ___ Return to work/school ________  Special Instructions: Continue aspirin and Plavix x3 months with follow-up per neurology services  CT of the chest in 3 to 6 months to evaluate incidental findings of right upper lobe pulmonary nodule STROKE/TIA DISCHARGE INSTRUCTIONS SMOKING Cigarette smoking nearly doubles your risk of having a stroke & is the single most alterable risk factor  If you smoke or have smoked in the last 12 months, you are advised to quit smoking for your health.  Most of the excess cardiovascular risk related to smoking disappears within a year of stopping.  Ask you doctor about anti-smoking medications  [place] Quit Line: 1-800-QUIT NOW  Free Smoking Cessation Classes [phone]  CHOLESTEROL Know your levels; limit fat & cholesterol in your diet  Lipid Panel     Component Value Date/Time   CHOL 299 (H) 06/10/2017 0545   TRIG 347 (H) 06/10/2017 0545   HDL 66 06/10/2017 0545   CHOLHDL 4.5 06/10/2017 0545   VLDL 69 (H) 06/10/2017 0545   LDLCALC 164 (H) 06/10/2017 0545      Many patients benefit from treatment even if their cholesterol is at goal.  Goal: Total Cholesterol (CHOL) less than 160  Goal:  Triglycerides (TRIG) less than 150  Goal:  HDL greater than 40  Goal:  LDL (LDLCALC) less than 100   BLOOD PRESSURE American Stroke  Association blood pressure target is less that 120/80 mm/Hg  Your discharge blood pressure is:  BP: 108/74  Monitor your blood pressure  Limit your salt and alcohol intake  Many individuals will require more than one medication for high blood pressure  DIABETES (A1c is a blood sugar average for last 3 months) Goal HGBA1c is under 7% (HBGA1c is blood sugar average for last 3 months)  Diabetes: No known diagnosis of diabetes    Lab Results  Component Value Date   HGBA1C 4.7 (L) 06/10/2017     Your HGBA1c can be lowered with medications, healthy diet, and exercise.  Check your blood sugar as directed by your physician  Call your physician if you experience unexplained or low blood sugars.  PHYSICAL ACTIVITY/REHABILITATION Goal is 30 minutes at least 4 days per week  Activity: Increase activity slowly, Therapies: Physical Therapy: Home Health Return to work:   Activity decreases your risk of heart attack and stroke and makes your heart stronger.  It helps control your weight and blood pressure; helps you relax and can improve your mood.  Participate in a regular exercise program.  Talk with your doctor about the best form of exercise for you (dancing, walking, swimming, cycling).  DIET/WEIGHT Goal is to maintain a healthy weight  Your discharge diet is:  Diet Order           DIET DYS 3 Room service appropriate? Yes; Fluid consistency: Thin  Diet effective now  liquids Your height is:  Height: 5\' 3"  (160 cm) Your current weight is: Weight: 67.9 kg (149 lb 11.1 oz) Your Body Mass Index (BMI) is:  BMI (Calculated): 26.52  Following the type of diet specifically designed for you will help prevent another stroke.  Your goal weight range is:    Your goal Body Mass Index (BMI) is 19-24.  Healthy food habits can help reduce 3 risk factors for stroke:  High cholesterol, hypertension, and excess weight.  RESOURCES Stroke/Support Group:  Call [phone]   STROKE EDUCATION  PROVIDED/REVIEWED AND GIVEN TO PATIENT Stroke warning signs and symptoms How to activate emergency medical system (call 911). Medications prescribed at discharge. Need for follow-up after discharge. Personal risk factors for stroke. Pneumonia vaccine given:  Flu vaccine given:  My questions have been answered, the writing is legible, and I understand these instructions.  I will adhere to these goals & educational materials that have been provided to me after my discharge from the hospital.      My questions have been answered and I understand these instructions. I will adhere to these goals and the provided educational materials after my discharge from the hospital.  Patient/Caregiver Signature _______________________________ Date __________  Clinician Signature _______________________________________ Date __________  Please bring this form and your medication list with you to all your follow-up doctor's appointments.

## 2017-06-15 NOTE — Care Management Note (Signed)
Inpatient [facility] Individual Statement of Services  Patient Name:  [name] [name]  Date:  06/15/2017  Welcome to the [facility].  Our goal is to provide you with an individualized program based on your diagnosis and situation, designed to meet your specific needs.  With this comprehensive rehabilitation program, you will be expected to participate in at least 3 hours of rehabilitation therapies Monday-Friday, with modified therapy programming on the weekends.  Your rehabilitation program will include the following services:  Physical Therapy (PT), Occupational Therapy (OT), Speech Therapy (ST), 24 hour per day rehabilitation nursing, Neuropsychology, Case Management (Social Worker), Rehabilitation Medicine, Nutrition Services and Pharmacy Services  Weekly team conferences will be held on Wednesday to discuss your progress.  Your Social Worker will talk with you frequently to get your input and to update you on team discussions.  Team conferences with you and your family in attendance may also be held.  Expected length of stay: 19-21 days Overall anticipated outcome: mod assist wheelchair level  Depending on your progress and recovery, your program may change. Your Social Worker will coordinate services and will keep you informed of any changes. Your Social Worker's name and contact numbers are listed  below.  The following services may also be recommended but are not provided by the [facility]:    [place]eline will be made to provide these services after discharge if needed.  Arrangements include referral to agencies that provide these services.  Your insurance has been verified to be:  [employer]Medicare Your primary doctor is:  [name]  Pertinent information will be shared with your doctor and your insurance company.  Social Worker:  Ovidio Kin, [place] or [phone]  Information discussed with and copy given to patient by: Elease Hashimoto, 06/15/2017, 1:47 PM

## 2017-06-15 NOTE — Progress Notes (Addendum)
[facility] PHYSICAL MEDICINE & REHABILITATION     PROGRESS NOTE  Subjective/Complaints:   Poor sleep, took a sleep medicine at [facility]oe (was for panic attacks-alprazolam)  ROS: denies CP, S OB, nausea, vomiting, diarrhea.  Objective: Vital Signs: Blood pressure 108/74, pulse 87, temperature 98.2 F (36.8 C), resp. rate 18, height 5\' 3"  (1.6 m), weight 67.9 kg (149 lb 11.1 oz), SpO2 100 %. No results found. Recent Labs    06/13/17 1558  WBC 7.9  HGB 10.3*  HCT 33.7*  PLT 290   Recent Labs    06/13/17 1558  CREATININE <0.30*   CBG (last 3)  No results for input(s): GLUCAP in the last 72 hours.  Wt Readings from Last 3 Encounters:  06/15/17 67.9 kg (149 lb 11.1 oz)  06/09/17 77.1 kg (170 lb)  05/15/17 77.1 kg (170 lb)    Physical Exam:  BP 108/74 (BP Location: Right Arm)   Pulse 87   Temp 98.2 F (36.8 C)   Resp 18   Ht 5\' 3"  (1.6 m)   Wt 67.9 kg (149 lb 11.1 oz)   SpO2 100%   BMI 26.52 kg/m  Constitutional: She appearswell-developed.  Obese. HENT: Poor dentition.  Normocephalic.  Atraumatic.   Eyes:EOMI.  No discharge.   Cardiovascular:RRR. No JVD. Respiratory:Effort normal.  Clear. GI: She exhibitsno distension.  Bowel sounds normal.   Musculoskeletal: No edema or tenderness in extremities  Neurological: She isalertand oriented. Dysarthria.  Motor: RUE 1/5 deltoid, biceps, triceps.  RLE: 2+/5  hip flexion, knee extension, 2/5 ankle dorsiflexion.    Assessment/Plan: 1. 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 Physiatrist is providing close team supervision and 24 hour management of active medical problems listed below. Physiatrist and rehab team continue to assess barriers to discharge/monitor patient progress toward functional and medical goals.  Function:  Bathing Bathing position       Bathing parts   Body parts bathed by helper: Right arm, Left arm, Chest, Abdomen, Front perineal area, Buttocks, Right upper leg, Left upper leg, Right lower leg, Left lower leg, Back  Bathing assist Assist Level: (total A)      Upper Body Dressing/Undressing Upper body dressing   What is the patient wearing?: Hospital gown                Upper body assist Assist Level: (total A)      Lower Body Dressing/Undressing Lower body dressing   What is the patient wearing?: Non-skid slipper socks           Non-skid slipper socks- Performed by helper: Don/doff right sock, Don/doff left sock                  Lower body assist        Toileting Toileting Toileting activity did not occur: Safety/medical concerns        Toileting assist     Transfers Chair/bed transfer   Chair/bed transfer method: Lateral scoot Chair/bed transfer assist level: Total assist (Pt < 25%) Chair/bed transfer assistive device: Sliding board, Armrests     Locomotion Ambulation Ambulation activity did not occur: Safety/medical concerns         Wheelchair   Type: Motorized   Assist Level: Dependent (Pt equals 0%)  Cognition Comprehension Comprehension assist level: Follows basic conversation/direction with no assist  Expression Expression assist level: Expresses basic 90% of the time/requires  cueing < 10% of the time.  Social Interaction Social Interaction assist level: Interacts appropriately 90% of the time - Needs monitoring or encouragement for participation or interaction.  Problem Solving Problem solving assist level: Solves basic 75 - 89% of the time/requires cueing 10 - 24% of the time  Memory Memory assist level: Recognizes or recalls 90% of the time/requires cueing < 10% of the time    Medical Problem List and Plan: 1.Decreased functional mobilitysecondary to left frontoparietal MCA infarction on 7/16 complicated by history of polymyositis  Continue CIR PT, OT, SLP 2.  DVT Prophylax notes reviewed, labs reviewed, is/Anticoagulation: Subcutaneous Lovenox. Monitor for any bleeding episodes 3. Pain Management:Neurontin 100 mg twice daily Tylenol as needed 4. Mood:Xanax 0.25 mg twice daily as needed 5. Neuropsych: This patientiscapable of making decisions on herown behalf. 6. Skin/Wound Care:Routine skin checks 7. Fluids/Electrolytes/Nutrition:Routine in and outs   BMP within acceptable range on 5/28  6/1 creat< 0.3 8.Polymyositis. Continue CellCept 500 mg twice daily. Patient has been wheelchair-bound for approximately the past 8 months 9.Chronic diastolic congestive heart failure. Monitor for any signs of fluid overload Filed Weights   06/12/17 1842 06/15/17 0438  Weight: 77.1 kg (170 lb) 67.9 kg (149 lb 11.1 oz)   Daily weights ordered 10.Hypertension. Tenormin 50 mg daily, Lasix 40 mg daily prior to admission.  Vitals:   06/14/17 1941 06/15/17 0438  BP: (!) 116/57 108/74  Pulse: (!) 56 87  Resp: 18 18  Temp: 98 F (36.7 C) 98.2 F (36.8 C)  SpO2: 100% 100%    controlled on 6/3 11.Pulmonary nodules. Incidental finding on CTA of neck. Noncontrast CT 3 to 6 months 12.Hyperlipidemia. Lipitor 13.  Acute blood loss anemia   Hemoglobin 10.3 on 6/1   Continue to monitor  LOS (Days) 3 A FACE TO FACE EVALUATION WAS PERFORMED  Charlett Blake 06/15/2017 8:09 AM

## 2017-06-15 NOTE — Progress Notes (Signed)
Occupational Therapy Session Note  Patient Details  Name: [name] [name] MRN: [id number] Date of Birth: 22-Nov-1940  Today's Date: 06/15/2017 OT Individual Time: 1000-1130 OT Individual Time Calculation (min): 90 min   Short Term Goals: Week 1:  OT Short Term Goal 1 (Week 1): Pt will perform UB dressing with max A.  OT Short Term Goal 2 (Week 1): Pt will transfer onto toilet with one person for assistance.  Skilled Therapeutic Interventions/Progress Updates:    Pt greeted semi-reclined in bed and agreeable to OT. Pt brought into long sitting in bed and brought LE's into circle sitting with OT assistance. Pt able to maintain sitting balance with supervision. OT set pt up with wash cloth and pt was able to wash LEs using L UE. OT provided hand over hand A to integrate R UE into bathing tasks for neuro re-ed with improve shoulder and scap activation. Pt leaned back for rest, then brought back into long sitting to wash upper body. Hand over hand A to bring L UE to abdomen and chest 2/2 strength deficits and fatigue. Total A to thread B LEs into pants and rolling completed L and R to pull pants over hips. Total A for UB dressing in long sitting. Maximove used to transfer pt to TIS wc 2/2 no +2 available. Pt brought down to therapy gym and worked on [place] with towel pushes on raised table. Pt with good shoulder activation.  Pt able to oppose R thumb and flex first finger slightly. Pt returned to room and left seated in TIS wc with safety belt on and needs met.   Therapy Documentation Precautions:  Precautions Precautions: Fall Type of Shoulder Precautions: R shoulder weakness and subluxation most recent stroke Restrictions Weight Bearing Restrictions: No  See Function Navigator for Current Functional Status.   Therapy/Group: Individual Therapy  Valma Cava 06/15/2017, 11:10 AM

## 2017-06-16 ENCOUNTER — Inpatient Hospital Stay (HOSPITAL_COMMUNITY): Payer: Medicare Other | Admitting: Physical Therapy

## 2017-06-16 ENCOUNTER — Inpatient Hospital Stay (HOSPITAL_COMMUNITY): Payer: Medicare Other

## 2017-06-16 ENCOUNTER — Inpatient Hospital Stay (HOSPITAL_COMMUNITY): Payer: Medicare Other | Admitting: Occupational Therapy

## 2017-06-16 NOTE — Progress Notes (Signed)
Occupational Therapy Session Note  Patient Details  Name: [name] [name] MRN: [id number] Date of Birth: Aug 22, 1940  Today's Date: 06/16/2017 OT Individual Time: 1305-1500 OT Individual Time Calculation (min): 115 min    Short Term Goals: Week 1:  OT Short Term Goal 1 (Week 1): Pt will perform UB dressing with max A.  OT Short Term Goal 2 (Week 1): Pt will transfer onto toilet with one person for assistance.  Skilled Therapeutic Interventions/Progress Updates:    Pt seen for OT session focusing on ADL re-training, functional transfers, pre-standing activity, neuro re-ed and core strengthening/stability. Pt in supine upon arrival with hand off to OT from NT who was providing total A for feeding of lunch. OT provided max hand over hand assist to use  UE to self feed. Pt with decreased strength proximal> distal, able to functional grasp fork and cup, however, unable to bring to mouth, assisted with support in gravity eliminated position.  Following lunch, requested use of bed pan, required +2 assist to place bedpan. Total A toileting, hygiene and clothing management to don pants at bed level. She transferred to sitting EOB with +2 assist using hospital bed functions. Pt able to laterally lean to R/L and return to midline with guarding assist. Completed +2 sliding board transfer to power chair.  Attempted standing with use of SARA plus, unable to clear buttock from chair despite lift and +2 assist. Will attempt use of standing frame in future session.  At high/low table completed functional grasping task with L UE, pt able to pick up and place bean bag into small basket with significantly increased time and rest breaks required throughout. Completed arm skate task with R UE. Pt with 3/4 range tricep extension in gravity eliminated position and able to achieve 1/4 ROM elbow flexion in gravity eliminated position. Able to achieve fll range extension with use of arm skate and min-mod A required for elbow  flexion. Pt with poor sensation and proprioceptive abilities in R UE and poor attention to extremity affecting her ability to attend to and attempt movement in R UE. Pt completed x3 sets of 10 "crunches" from w/c level, chair reclined back on subsequent trials. Pt able to come into upright right sitting from reclined chair for core strengthening/stabilty.  Pt returned to room at end of session, left with all needs in reach.   Therapy Documentation Precautions:  Precautions Precautions: Fall Type of Shoulder Precautions: R shoulder weakness and subluxation most recent stroke Restrictions Weight Bearing Restrictions: No Pain:   No/denies pain  See Function Navigator for Current Functional Status.   Therapy/Group: Individual Therapy  [name] 06/16/2017, 7:11 AM

## 2017-06-16 NOTE — Progress Notes (Signed)
Speech Language Pathology Daily Session Note  Patient Details  Name: [name] [name] MRN: [id number] Date of Birth: 1940/07/08  Today's Date: 06/16/2017 SLP Individual Time: 1035-1100 SLP Individual Time Calculation (min): 25 min  Short Term Goals: Week 1: SLP Short Term Goal 1 (Week 1): Pt will consume regular textured trials with efficitent mastication and no overt aspiration to demonstrate readiness for diet advancement. SLP Short Term Goal 2 (Week 1): Pt will demonstrate functional problem solving for familar mildly complex tasks with supervision A verbal and question cues. SLP Short Term Goal 3 (Week 1): Pt will identitfy 3 activties pt can and can not participate in at home due to current physical deficits given supervision A verbal cues,   Skilled Therapeutic Interventions:Skilled ST services focused on cognitive skills. SLP facilitated semi-complex problem solving with medication management requring min A for problem solving and errors awareness/correction, however unable to complete exercises due to focusing on awareness skills. SLP facilitated awareness of deficits and anticipatory awareness planning for discharge with RUE deficit along with prior left arm impairment, pt required max A fading to min A verbal and visual cues to agree that she will need help during the day when her son is at work and even to [facility] stay. Pt stated piror to admission, utilize "cup" bedside bed to urinate while son was at work and agree after attempt to recreate positioning that she is not longer able/safe to preform this task, leading to increase in insight. Pt was left in room with call bell within reach. Reccomend to continue skilled ST services.      Function:  Eating Eating                 Cognition Comprehension Comprehension assist level: Follows basic conversation/direction with extra time/assistive device  Expression   Expression assist level: Expresses basic needs/ideas: With extra  time/assistive device  Social Interaction Social Interaction assist level: Interacts appropriately 75 - 89% of the time - Needs redirection for appropriate language or to initiate interaction.  Problem Solving Problem solving assist level: Solves basic problems with no assist  Memory Memory assist level: Recognizes or recalls 90% of the time/requires cueing < 10% of the time    Pain Pain Assessment Pain Score: 0-No pain  Therapy/Group: Individual Therapy  [name] [name]  [facility] 06/16/2017, 12:45 PM

## 2017-06-16 NOTE — Progress Notes (Signed)
Social Work Patient ID: [name] [name], female   DOB: 14-Apr-1940, 77 y.o.   MRN: [id number]  Spoke with son-Andreal to discuss coming in for therapies, he is off on Thursday and will let worker know when he can be here to observe. Discussed therapy tea will recommend 24 hr care at discharge. Pt is aware of this and feelt they way thery were before she can manage being alone for 10-11 hours three days a week. Will await son's call.

## 2017-06-16 NOTE — Progress Notes (Addendum)
Physical Therapy Session Note  Patient Details  Name: [name] [name] MRN: [id number] Date of Birth: [birth year]  Today's Date: 06/16/2017 PT Individual Time: 3614-4315 -- PT Individual Time Calculation (min): 61 min PT Concurrent Time: 4008-6761 -- PT Concurrent Time Calculation (min): 15 min   Short Term Goals: Week 1:  PT Short Term Goal 1 (Week 1): Pt will perform bed mobility w/ mod assist PT Short Term Goal 2 (Week 1): Pt will perform sit<>stand w/ max assist x2 for LE strengthening PT Short Term Goal 3 (Week 1): Pt will maintain dynamic sitting balance w/ supervision PT Short Term Goal 4 (Week 1): Pt will perform bed<>chair transfer w/ max assist x1  Skilled Therapeutic Interventions/Progress Updates:  Treatment 1: Pt received in bed reporting she feels "lousy" 2/2 receiving medication last night & RN made aware. No c/o pain reported & pt agreeable to tx. Provided pt with power w/c and pt performed rolling L<>R with +2 assist as pt unable to grasp bed rail with RUE and with limited ability to assist with LUE 2/2 weakness. Maximove sling placed total assist and pt transferred to power w/c with maximove lift for safety. Pt unable to maneuver w/c 2/2 impaired use of RUE therefore awaiting w/c tech to switch controls to L side. Therapist adjusted legrests and seat belt on w/c for optimal positioning and increased safety when sitting in w/c. Pt with tight heel cords and inability to place LE in neutral position on foot places; clonus also observed in LLE. Pt with BLE splaying when in bed and w/c therefore quick release belt used to maintain proper hip/knee alignment. At end of session pt left in power w/c with seat belt donned & in handoff to SLP.  Treatment 2: Pt seen for concurrent session. Pt received in power w/c & agreeable to tx, no c/o pain reported. Transported pt to dayroom via w/c total assist. Therapist adjusted headrest for improved postioning & comfort. Pt maneuvered power w/c back  towards room (~75 ft) before reporting LUE fatigue and therapist assisted pt remainder of way to room. At end of session pt left in w/c with quick release belt donned & call bell within reach.   Therapy Documentation Precautions:  Precautions Precautions: Fall Type of Shoulder Precautions: R shoulder weakness and subluxation most recent stroke Restrictions Weight Bearing Restrictions: No    See Function Navigator for Current Functional Status.   Therapy/Group: Individual Therapy  Waunita Schooner 06/16/2017, 5:59 PM

## 2017-06-16 NOTE — Progress Notes (Signed)
[facility] PHYSICAL MEDICINE & REHABILITATION     PROGRESS NOTE  Subjective/Complaints:   No issues overnite, eating without cough , no bowel or bladder c/os  ROS: denies CP, S OB, nausea, vomiting, diarrhea.  Objective: Vital Signs: Blood pressure 114/62, pulse 82, temperature 99 F (37.2 C), temperature source Oral, resp. rate 18, height 5\' 3"  (1.6 m), weight 68 kg (149 lb 14.6 oz), SpO2 96 %. No results found. Recent Labs    06/13/17 1558 06/15/17 0809  WBC 7.9 6.1  HGB 10.3* 10.1*  HCT 33.7* 33.3*  PLT 290 278   Recent Labs    06/13/17 1558 06/15/17 0809  NA  --  142  K  --  3.0*  CL  --  101  GLUCOSE  --  121*  BUN  --  13  CREATININE <0.30* 0.68  CALCIUM  --  8.9   CBG (last 3)  No results for input(s): GLUCAP in the last 72 hours.  Wt Readings from Last 3 Encounters:  06/16/17 68 kg (149 lb 14.6 oz)  06/09/17 77.1 kg (170 lb)  05/15/17 77.1 kg (170 lb)    Physical Exam:  BP 114/62 (BP Location: Left Arm)   Pulse 82   Temp 99 F (37.2 C) (Oral)   Resp 18   Ht 5\' 3"  (1.6 m)   Wt 68 kg (149 lb 14.6 oz)   SpO2 96%   BMI 26.56 kg/m  Constitutional: She appearswell-developed.  Obese. HENT: Poor dentition.  Normocephalic.  Atraumatic.   Eyes:EOMI.  No discharge.   Cardiovascular:RRR. No JVD. Respiratory:Effort normal.  Clear. GI: She exhibitsno distension.  Bowel sounds normal.   Musculoskeletal: No edema or tenderness in extremities  Neurological: She isalertand oriented. Dysarthria.  Motor: RUE 1/5 deltoid, biceps, triceps.  RLE: 2+/5  hip flexion, knee extension, 2/5 ankle dorsiflexion.    Assessment/Plan: 1. 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 Physiatrist is providing close team supervision and 24 hour management of active medical problems listed below. Physiatrist and  rehab team continue to assess barriers to discharge/monitor patient progress toward functional and medical goals.  Function:  Bathing Bathing position   Position: Bed  Bathing parts Body parts bathed by patient: Right upper leg, Left upper leg Body parts bathed by helper: Right arm, Left arm, Chest, Abdomen, Front perineal area, Buttocks, Right upper leg, Left upper leg, Right lower leg, Left lower leg, Back  Bathing assist Assist Level: 2 helpers      Upper Body Dressing/Undressing Upper body dressing   What is the patient wearing?: Pull over shirt/dress       Pull over shirt/dress - Perfomed by helper: Thread/unthread right sleeve, Thread/unthread left sleeve, Put head through opening, Pull shirt over trunk        Upper body assist Assist Level: (Total A)      Lower Body Dressing/Undressing Lower body dressing   What is the patient wearing?: Pants, Non-skid slipper socks       Pants- Performed by helper: Thread/unthread right pants leg, Thread/unthread left pants leg, Pull pants up/down   Non-skid slipper socks- Performed by helper: Don/doff right sock, Don/doff left sock                  Lower body assist Assist for lower body dressing: 2 Helpers      Toileting Toileting Toileting activity did not occur: Safety/medical  concerns   Toileting steps completed by helper: Adjust clothing prior to toileting, Performs perineal hygiene, Adjust clothing after toileting(per Candice, NT report)    Toileting assist Assist level: Two helpers(per Haylee Rivay, NT report)   Transfers Chair/bed transfer   Chair/bed transfer method: Lateral scoot Chair/bed transfer assist level: 2 helpers Chair/bed transfer assistive device: Mechanical lift Mechanical lift: Maximove   Locomotion Ambulation Ambulation activity did not occur: Safety/medical concerns         Wheelchair   Type: Motorized   Assist Level: Dependent (Pt equals 0%)  Cognition Comprehension Comprehension  assist level: Follows basic conversation/direction with extra time/assistive device  Expression Expression assist level: Expresses basic needs/ideas: With extra time/assistive device  Social Interaction Social Interaction assist level: Interacts appropriately 75 - 89% of the time - Needs redirection for appropriate language or to initiate interaction.  Problem Solving Problem solving assist level: Solves complex 90% of the time/cues < 10% of the time  Memory Memory assist level: Recognizes or recalls 90% of the time/requires cueing < 10% of the time    Medical Problem List and Plan: 1.Decreased functional mobilitysecondary to left frontoparietal MCA infarction on 5/91 complicated by history of polymyositis  Continue CIR PT, OT, SLP- team conf in am 2. DVT Prophylax notes reviewed, labs reviewed, is/Anticoagulation: Subcutaneous Lovenox. Monitor for any bleeding episodes 3. Pain Management:Neurontin 100 mg twice daily Tylenol as needed 4. Mood:Xanax 0.25 mg twice daily as needed 5. Neuropsych: This patientiscapable of making decisions on herown behalf. 6. Skin/Wound Care:Routine skin checks 7. Fluids/Electrolytes/Nutrition:Routine in and outs   BMP within acceptable range on 5/28  6/1 creat< 0.3 8.Polymyositis. Continue CellCept 500 mg twice daily. Patient has been wheelchair-bound for approximately the past 8 months 9.Chronic diastolic congestive heart failure. Monitor for any signs of fluid overload Filed Weights   06/12/17 1842 06/15/17 0438 06/16/17 0340  Weight: 77.1 kg (170 lb) 67.9 kg (149 lb 11.1 oz) 68 kg (149 lb 14.6 oz)   Daily weights ordered 10.Hypertension. Tenormin 50 mg daily, Lasix 40 mg daily prior to admission.  Vitals:   06/15/17 2030 06/16/17 0340  BP: (!) 108/54 114/62  Pulse: 79 82  Resp: 18 18  Temp: 98 F (36.7 C) 99 F (37.2 C)  SpO2: 95% 96%    controlled on 6/4 11.Pulmonary nodules. Incidental finding on CTA of neck. Noncontrast  CT 3 to 6 months 12.Hyperlipidemia. Lipitor 13.  Acute blood loss anemia   Hemoglobin 10.3 on 6/1   Continue to monitor 14.   LOS (Days) 4 A FACE TO FACE EVALUATION WAS PERFORMED  Charlett Blake 06/16/2017 8:07 AM

## 2017-06-17 ENCOUNTER — Inpatient Hospital Stay (HOSPITAL_COMMUNITY): Payer: Medicare Other

## 2017-06-17 ENCOUNTER — Inpatient Hospital Stay (HOSPITAL_COMMUNITY): Payer: Medicare Other | Admitting: Physical Therapy

## 2017-06-17 ENCOUNTER — Inpatient Hospital Stay (HOSPITAL_COMMUNITY): Payer: Medicare Other | Admitting: Occupational Therapy

## 2017-06-17 NOTE — Progress Notes (Signed)
Speech Language Pathology Daily Session Note  Patient Details  Name: [name] [name] MRN: [id number] Date of Birth: [birth year]  Today's Date: 06/17/2017 SLP Individual Time: 2811-8867 / 930-100 SLP Individual Time Calculation (min): 30 min and 30 min  Short Term Goals: Week 1: SLP Short Term Goal 1 (Week 1): Pt will consume regular textured trials with efficitent mastication and no overt aspiration to demonstrate readiness for diet advancement. SLP Short Term Goal 1 - Progress (Week 1): Met SLP Short Term Goal 2 (Week 1): Pt will demonstrate functional problem solving for familar mildly complex tasks with supervision A verbal and question cues. SLP Short Term Goal 3 (Week 1): Pt will identitfy 3 activties pt can and can not participate in at discharge due to current physical deficits given supervision A verbal cues,   Skilled Therapeutic Interventions: 1# Skilled ST services focused on swallow and cognitive skills. SLP facilitated recall of medication management, pt required max A  Fading to min A verbal cues to recall medication utilizing visual aid. Pt required max A verbal cues fading to min A verbal cues for awareness of deficits in RUE impacting daily living function ( stating she can use scissors in her right arm to cut off the top of milk carton, after meals on wheels sets up lunch tray when pt is at home alone) given visual return demonstration. SLP continues to educate pt about safety and changes in daily living activities due to RUE and requiring 24 hour/supervision A.  SLP facilitated PO trial of regular textured foods with prolonged mastication and mod I use of liquid wash no overt s/s aspiration. Pt was left in room with call bell within reach. Reccomend to continue skilled ST services.  2# Skilled ST services focused on swallow skills and family eductaion. SLP facilitated PO consumption trial tray of regular textured foods and thin liquids with min right buccal pocketing and no overt  s/s aspiration. SLP upgraded diet to regular textured an thin liquids with full supervision for physical assistance and checking for right side pocketing. SLP educated pt and daughter about recommendation of SNF, daughter stated agreement and began reviewing placements with pt on utilizing Internet on phone . Pt was left in room with call bell within reach. Reccomend to continue skilled ST services.     Function:  Eating Eating   Modified Consistency Diet: No(regular trial ) Eating Assist Level: Set up assist for;Supervision or verbal cues;More than reasonable amount of time   Eating Set Up Assist For: Opening containers;Cutting food       Cognition Comprehension Comprehension assist level: Follows basic conversation/direction with extra time/assistive device  Expression   Expression assist level: Expresses basic needs/ideas: With extra time/assistive device  Social Interaction Social Interaction assist level: Interacts appropriately 75 - 89% of the time - Needs redirection for appropriate language or to initiate interaction.  Problem Solving Problem solving assist level: Solves basic 50 - 74% of the time/requires cueing 25 - 49% of the time  Memory Memory assist level: Recognizes or recalls 75 - 89% of the time/requires cueing 10 - 24% of the time    Pain Pain Assessment Pain Score: 0-No pain  Therapy/Group: Individual Therapy  Othniel Maret  [facility] 06/17/2017, 3:46 PM

## 2017-06-17 NOTE — Plan of Care (Signed)
  Problem: RH BOWEL ELIMINATION Goal: RH STG MANAGE BOWEL WITH ASSISTANCE Description STG Manage Bowel with Mod Assistance.  Outcome: Progressing Goal: RH STG MANAGE BOWEL W/MEDICATION W/ASSISTANCE Description STG Manage Bowel with Medication with mod I Assistance.  Outcome: Progressing   Problem: RH BLADDER ELIMINATION Goal: RH STG MANAGE BLADDER WITH ASSISTANCE Description STG Manage Bladder With Mod Assistance  Outcome: Progressing Goal: RH OTHER STG BLADDER ELIMINATION GOALS W/ASSIST Description Other STG Bladder Elimination Goals With Assistance mod  Outcome: Progressing   Problem: RH BLADDER ELIMINATION Goal: RH OTHER STG BLADDER ELIMINATION GOALS W/ASSIST Description Other STG Bladder Elimination Goals With Assistance mod  Outcome: Progressing   Problem: RH SKIN INTEGRITY Goal: RH STG SKIN FREE OF INFECTION/BREAKDOWN Description Patient will be free of any skin breakdown entire stay on rehab with min assist  Outcome: Progressing Goal: RH STG MAINTAIN SKIN INTEGRITY WITH ASSISTANCE Description STG Maintain Skin Integrity With min  Assistance.  Outcome: Progressing    Problem: RH SAFETY Goal: RH STG ADHERE TO SAFETY PRECAUTIONS W/ASSISTANCE/DEVICE Description STG Adhere to Safety Precautions Mod With Assistance/Device.  Outcome: Progressing Goal: RH STG DECREASED RISK OF FALL WITH ASSISTANCE Description STG Decreased Risk of Fall With Mod Assistance.  Outcome: Progressing   Problem: RH PAIN MANAGEMENT Goal: RH STG PAIN MANAGED AT OR BELOW PT'S PAIN GOAL Description Pain free or less than 3  Outcome: Progressing   Problem: RH KNOWLEDGE DEFICIT Goal: RH STG INCREASE KNOWLEDGE OF HYPERTENSION Description Min assist  Outcome: Progressing

## 2017-06-17 NOTE — Progress Notes (Signed)
Physical Therapy Session Note  Patient Details  Name: [name] MRN: [id number] Date of Birth: [birth year]  Today's Date: 06/17/2017 PT Individual Time: 1600-1630 PT Individual Time Calculation (min): 30 min   Short Term Goals: Week 1:  PT Short Term Goal 1 (Week 1): Pt will perform bed mobility w/ mod assist PT Short Term Goal 2 (Week 1): Pt will perform sit<>stand w/ max assist x2 for LE strengthening PT Short Term Goal 3 (Week 1): Pt will maintain dynamic sitting balance w/ supervision PT Short Term Goal 4 (Week 1): Pt will perform bed<>chair transfer w/ max assist x1  Skilled Therapeutic Interventions/Progress Updates:    Pt received sidelying in bed asleep, easily arousable and agreeable to PT. No complaints of pain but pt does report feeling very fatigued. Sidelying to sitting EOB with max A for LE and trunk control, use of bedrails and HOB elevated. Sitting balance EOB with B feet supported on stool. Seated reaching with alt UE outside BOS to the L and R. Pt exhibits poor ability to reach with BUE secondary to weakness. Seated lateral leans on R/L UE and return to midline with max A. Educated pt on what a stroke is, her deficits, therapy treatment, etc. Pt seems somewhat receptive to education. Sit to supine total A. Pt left sidelying in bed with needs in reach, bed alarm in place.  Therapy Documentation Precautions:  Precautions Precautions: Fall Type of Shoulder Precautions: R shoulder weakness and subluxation most recent stroke Restrictions Weight Bearing Restrictions: No  See Function Navigator for Current Functional Status.   Therapy/Group: Individual Therapy  Excell Seltzer, PT, DPT  06/17/2017, 4:59 PM

## 2017-06-17 NOTE — Progress Notes (Signed)
Social Work Patient ID: [name] [name], female   DOB: 12-11-1940, 77 y.o.   MRN: [id number]  Met with pt and daughter was also here to discuss team conference recommendation of 24 hr care for safety and care. Discussed it is not safe of pt to be alone 10 hours three days a week when her son is working and her aide is there for four hours two days but other day she is there for 10 hours by herself. She feels she is better there then in a nursing home. Her brother was in one and was not treated well. Discussed could look at ones she preferred. Daughter voiced she is not physically able to assist her due to her own health issues. Pt will need to decide on home versus going to a NH for a short time. Son to come in tomorrow to attend therapies with pt. Will discuss same issues with him. Pt to decide what she plans to do at discharge aware date is 6/12.

## 2017-06-17 NOTE — Progress Notes (Signed)
Occupational Therapy Session Note  Patient Details  Name: [name] [name] MRN: [id number] Date of Birth: Jul 25, 1940  Today's Date: 06/17/2017 OT Individual Time: 0160-1093 OT Individual Time Calculation (min): 60 min    Short Term Goals: Week 1:  OT Short Term Goal 1 (Week 1): Pt will perform UB dressing with max A.  OT Short Term Goal 2 (Week 1): Pt will transfer onto toilet with one person for assistance.  Skilled Therapeutic Interventions/Progress Updates:    Pt seen for OT session focusing on functional sitting balance, bed mobility, and transfer. Pt in supine upon arrival with nurse present about to assist with meal, hand off to OT. Pt transferred to sitting EOB with max A. She was able to maintain static sitting balance with supervision, guarding assist for dynamic balance with one total LOB to R onto bed and requiring max A to return to midline. Pt able to self feed 5 bites with L UE, support at elbow for gravity eliminated position. Pt using compensatory strateigies she used PTA. Pt coming forward to hand to self feed though fatigues quickly and total A provided for remainder of meal.  She returned to supine for brief to be donned and pants pulled up via rolling. Mod A rolling to R, +2 for rolling to L.  Completed +2 sliding board transfer to power w/c.  Grooming tasks completed from w/c level at sink. Pt using compensatory strategies to complete task 2/2 limited ROM- suspect this is how pt completed PTA. Assist for set-up.  Pt left seated in power w/c at end of session, seat belt donned, all needs in reach awaiting hand off to SLP  Therapy Documentation Precautions:  Precautions Precautions: Fall Type of Shoulder Precautions: R shoulder weakness and subluxation most recent stroke Restrictions Weight Bearing Restrictions: No Pain:   No/denies pain  See Function Navigator for Current Functional Status.   Therapy/Group: Individual Therapy  Lonna Rabold L 06/17/2017, 7:06 AM

## 2017-06-17 NOTE — Progress Notes (Signed)
[facility] PHYSICAL MEDICINE & REHABILITATION     PROGRESS NOTE  Subjective/Complaints:     ROS: denies CP, S OB, nausea, vomiting, diarrhea.  Objective: Vital Signs: Blood pressure 121/63, pulse 86, temperature 99 F (37.2 C), temperature source Oral, resp. rate 18, height 5\' 3"  (1.6 m), weight 68.1 kg (150 lb 2.1 oz), SpO2 96 %. No results found. Recent Labs    06/15/17 0809  WBC 6.1  HGB 10.1*  HCT 33.3*  PLT 278   Recent Labs    06/15/17 0809  NA 142  K 3.0*  CL 101  GLUCOSE 121*  BUN 13  CREATININE 0.68  CALCIUM 8.9   CBG (last 3)  No results for input(s): GLUCAP in the last 72 hours.  Wt Readings from Last 3 Encounters:  06/17/17 68.1 kg (150 lb 2.1 oz)  06/09/17 77.1 kg (170 lb)  05/15/17 77.1 kg (170 lb)    Physical Exam:  BP 121/63 (BP Location: Left Arm)   Pulse 86   Temp 99 F (37.2 C) (Oral)   Resp 18   Ht 5\' 3"  (1.6 m)   Wt 68.1 kg (150 lb 2.1 oz)   SpO2 96%   BMI 26.59 kg/m  Constitutional: She appearswell-developed.  Obese. HENT: Poor dentition.  Normocephalic.  Atraumatic.   Eyes:EOMI.  No discharge.   Cardiovascular:RRR. No JVD. Respiratory:Effort normal.  Clear. GI: She exhibitsno distension.  Bowel sounds normal.   Musculoskeletal: No edema or tenderness in extremities  Neurological: She isalertand oriented. Dysarthria.  Motor: RUE 1/5 deltoid, biceps, triceps.  RLE: 2+/5  hip flexion, knee extension, 2/5 ankle dorsiflexion.    Assessment/Plan: 1. 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 Physiatrist is providing close team supervision and 24 hour management of active medical problems listed below. Physiatrist and rehab team continue to assess barriers to discharge/monitor patient progress toward functional and medical goals.  Function:  Bathing Bathing  position   Position: Bed  Bathing parts Body parts bathed by patient: Right upper leg, Left upper leg Body parts bathed by helper: Right arm, Left arm, Chest, Abdomen, Front perineal area, Buttocks, Right upper leg, Left upper leg, Right lower leg, Left lower leg, Back  Bathing assist Assist Level: 2 helpers      Upper Body Dressing/Undressing Upper body dressing   What is the patient wearing?: Pull over shirt/dress       Pull over shirt/dress - Perfomed by helper: Thread/unthread right sleeve, Thread/unthread left sleeve, Put head through opening, Pull shirt over trunk        Upper body assist Assist Level: (Total A)      Lower Body Dressing/Undressing Lower body dressing   What is the patient wearing?: Pants, Non-skid slipper socks       Pants- Performed by helper: Thread/unthread right pants leg, Thread/unthread left pants leg, Pull pants up/down   Non-skid slipper socks- Performed by helper: Don/doff right sock, Don/doff left sock                  Lower body assist Assist for lower body dressing: 2 Helpers      Toileting Toileting Toileting activity did not occur: Safety/medical concerns   Toileting steps completed by helper: Adjust clothing prior to toileting, Performs perineal hygiene, Adjust clothing after toileting Toileting Assistive Devices: Grab bar or rail  Toileting assist Assist level: Two helpers(per Haylee Rivay, NT report)  Transfers Chair/bed transfer   Chair/bed transfer method: Lateral scoot Chair/bed transfer assist level: 2 helpers Chair/bed transfer assistive device: Mechanical lift Mechanical lift: Maximove   Locomotion Ambulation Ambulation activity did not occur: Safety/medical concerns         Wheelchair   Type: Motorized Max wheelchair distance: 75 ft Assist Level: Supervision or verbal cues  Cognition Comprehension Comprehension assist level: Follows basic conversation/direction with extra time/assistive device   Expression Expression assist level: Expresses basic needs/ideas: With extra time/assistive device  Social Interaction Social Interaction assist level: Interacts appropriately 75 - 89% of the time - Needs redirection for appropriate language or to initiate interaction.  Problem Solving Problem solving assist level: Solves basic problems with no assist  Memory Memory assist level: Recognizes or recalls 90% of the time/requires cueing < 10% of the time    Medical Problem List and Plan: 1.Decreased functional mobilitysecondary to left frontoparietal MCA infarction on 4/53 complicated by history of polymyositis  Continue CIR PT, OT, SLP- team conf in am 2. DVT Prophylax notes reviewed, labs reviewed, is/Anticoagulation: Subcutaneous Lovenox. Monitor for any bleeding episodes 3. Pain Management:Neurontin 100 mg twice daily Tylenol as needed 4. Mood:Xanax 0.25 mg twice daily as needed 5. Neuropsych: This patientiscapable of making decisions on herown behalf. 6. Skin/Wound Care:Routine skin checks 7. Fluids/Electrolytes/Nutrition:Routine in and outs   6/3 HypoK- cont KCL 27meq daily, recheck in am  6/1 creat< 0.3 8.Polymyositis. Continue CellCept 500 mg twice daily. Patient has been wheelchair-bound for approximately the past 8 months 9.Chronic diastolic congestive heart failure. Monitor for any signs of fluid overload Filed Weights   06/15/17 0438 06/16/17 0340 06/17/17 0457  Weight: 67.9 kg (149 lb 11.1 oz) 68 kg (149 lb 14.6 oz) 68.1 kg (150 lb 2.1 oz)   Daily weights ordered 10.Hypertension. Tenormin 50 mg daily, Lasix 40 mg daily prior to admission.  Vitals:   06/16/17 2013 06/17/17 0457  BP: (!) 157/79 121/63  Pulse: 71 86  Resp: 18 18  Temp: (!) 97.4 F (36.3 C) 99 F (37.2 C)  SpO2: 98% 96%    controlled on 6/5 but has been Labile 11.Pulmonary nodules. Incidental finding on CTA of neck. Noncontrast CT 3 to 6 months 12.Hyperlipidemia. Lipitor 13.   Acute blood loss anemia   Hemoglobin 10.3 on 6/1   Continue to monitor 14.   LOS (Days) 5 A FACE TO FACE EVALUATION WAS PERFORMED  Charlett Blake 06/17/2017 8:20 AM

## 2017-06-17 NOTE — Progress Notes (Signed)
Physical Therapy Session Note  Patient Details  Name: [name] [name] MRN: [id number] Date of Birth: [birth year]  Today's Date: 06/17/2017 PT Individual Time: 1336-1430 PT Individual Time Calculation (min): 54 min   Short Term Goals: Week 1:  PT Short Term Goal 1 (Week 1): Pt will perform bed mobility w/ mod assist PT Short Term Goal 2 (Week 1): Pt will perform sit<>stand w/ max assist x2 for LE strengthening PT Short Term Goal 3 (Week 1): Pt will maintain dynamic sitting balance w/ supervision PT Short Term Goal 4 (Week 1): Pt will perform bed<>chair transfer w/ max assist x1  Skilled Therapeutic Interventions/Progress Updates:  Pt received in w/c with daughter Guerry Minors) present for session. Pt requesting to return to bed 2/2 fatigue. Spent significant portion of session discussing pt's PLOF, purpose and goals of therapy, and d/c recommendations. Pt's daughter reports pt was able to take herself to the bathroom ~1 month ago, but has gotten stuck a couple times and has required assistance from daughter. She also reports pt has set up a device to allow her to void EOB and place trash bags underneath her to prevent soiling bed. Pt's daughter ambulates with a SPC & reports she needs B hip replacements in the future herself. Inquired if Guerry Minors could bring pt's power w/c into CIR, possibly accompanying pt's son on SCAT to bring it up here for adjustments to be made. Educated her on need to switch control to L side 2/2 impaired use of RUE and need for pt to stay mobile until pt's RUE is stronger. Pt's daughter hopeful that her RUE will become stronger while in therapy but educated this could take weeks to months. Pt's daughter does not appear able to bring power w/c in for adjustments. Therapist educated pt & daughter on recommendation of 24 hr assist upon d/c and pt & daughter report pt is agreeable to going SNF upon d/c from [place] Jacqlyn Larsen) made aware. Pt then set up for slide board transfer elevated  w/c>low bed with +2 assist. Pt requires total assist for slide board placement and max multimodal cuing for sequencing. Pt with very little ability to help with LUE and requires assistance to maintain balance when transferring across board. Once sitting EOB pt required assistance to lower trunk to supine, assistance with RLE, but pt able to bring LLE onto bed. Pt reports she typically sleeps on her L side and can roll to this position on her own at night with increased time. Educated pt on need to attempt these tasks during therapy sessions. Pt required min assist to roll L with max cuing to utilize LUE to hold RUE but pt requires assistance to attain this position. Pt requires max cuing for LE placement and sequencing and is unable to fully transfer into L sidelying but almost achieves this. At end of session pt left in bed with alarm set & needs within reach.  Therapy Documentation Precautions:  Precautions Precautions: Fall Type of Shoulder Precautions: R shoulder weakness and subluxation most recent stroke Restrictions Weight Bearing Restrictions: No   See Function Navigator for Current Functional Status.   Therapy/Group: Individual Therapy  Waunita Schooner 06/17/2017, 4:23 PM

## 2017-06-17 NOTE — Patient Care Conference (Signed)
Inpatient RehabilitationTeam Conference and Plan of Care Update Date: 06/17/2017   Time: 10:30 AM    Patient Name: [name] [name]      Medical Record Number: [id number]  Date of Birth: 1940/04/16 Sex: Female         Room/Bed: 4W21C/4W21C-01 Payor Info: Payor: [employer] MEDICARE / Plan: [employer] MEDICARE / Product Type: *No Product type* /    Admitting Diagnosis: CVA  Admit Date/Time:  06/12/2017  4:57 PM Admission Comments: No comment available   Primary Diagnosis:  <principal problem not specified> Principal Problem: <principal problem not specified>  Patient Active Problem List   Diagnosis Date Noted  . Neuropathic pain   . 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 Neurologic deficit due to acute ischemic stroke ([place])   . Hypokalemia   . Chronic diastolic congestive heart failure ([place])   . AVM (arteriovenous malformation)   . CVA (cerebral vascular accident) ([place]) 06/09/2017  . Multiple pulmonary nodules 06/09/2017  . Hypothermia 10/22/2016  . Chronic diastolic CHF (congestive heart failure) ([place]) 10/22/2016  . Abdominal pain, unspecified site 05/17/2013  . Nausea alone 05/17/2013  . 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 Abdominal pain, left lower quadrant 05/28/2011  . Cecal ulcer with hemorrhage 01/10/2011  . Acute blood loss anemia 12/06/2010  . Hematochezia 12/05/2010  . Anemia 12/05/2010  . Adrenal insufficiency ([place]) 12/05/2010  . Osteoporosis 12/05/2010  . LUNG INVOLVEMENT OTHER DISEASES CLASSIFIED ELSW 08/13/2009  . OTHER SPECIFIED DISORDER OF STOMACH AND DUODENUM 07/06/2008  . Essential hypertension 04/26/2007  . ALLERGIC RHINITIS 04/26/2007  . GASTROESOPHAGEAL REFLUX DISEASE 04/26/2007  . Polymyositis ([place]) 04/26/2007  . COLONIC POLYPS, ADENOMATOUS 02/06/2005    Expected Discharge Date: Expected Discharge Date: 06/24/17  Team Members Present: Physician leading conference: Dr. 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, dysarthria and swallowing problems  Maintain medical stability during rehabilitation stay  Correct electrolyte disturbance, hypokalemia, monitor mild anemia   Bowel/Bladder   pt is continent of b/b with 2 assist LBM 06/04  pt will remain continent of b/b with mod assist with normal bowel pattern  assess b/b qshift laxatives as needed   Swallow/Nutrition/ Hydration   Dys 3 and thin  Mod I  regular trials    ADL's   +2 basic transfers; total A UB/LB bathing/dressing from bed level; Total A grooming and self feeding  [place] mod A overall; min A UB dressing and grooming  Functional mobility, neuro re-ed, ADL re-training, family ed   Mobility   total assist bed mobility, dependent bed<>w/c transfer, per pt report she is close to baseline with exception of bed mobility, working on adjusting power w/c to increase ease of pt's use  mod I power w/c mobility, max assist bed<>w/c transfer, mod assist bed mobility  NMR, strengthening, significant pt/family education & d/c planning, bed mobility, transfers   Communication             Safety/Cognition/ Behavioral Observations  Mod A  supervision A - downgrade   mildly complex problem solving and anticitpatory awareness   Pain   pt denies any pain   pain <=2/10  assess pain qshift and medicate prn  Skin   pt skin intact   pts skin will remain intact without any breakdown  assess skin qshift and prn      *See Care Plan and progress notes for long and short-term goals.     Barriers to Discharge  Current Status/Progress Possible Resolutions Date Resolved   Physician    Medical stability      Progressing towards goals  Continue rehabilitation program      Nursing                  PT  Decreased caregiver support;Lack of/limited family support  son is blind and works during the day so pt home alone at baseline              OT Other (comments)  caregiver is legally blind             SLP                SW Decreased caregiver support Does not have 24hr care            Discharge Planning/Teaching Needs:  Pt plans to return home with son who works three days a week and pt is alone for 10 hrs. She feels fine with this and is considered competent and able to make her own decisions. Team recommends 24 hr care.      Team Discussion:  Main issues are bed mobility and right hand usage.Pt used a power chair at home prior to admission and needed assistance. Son was assisting but pt was alone while he is at work-10 hours. Fatigues quickly. Continent of B & B. Team recommends NHP see if pt will agree to this. Son to come in tomorrow for training. Pt is capable of making her own decisions according to MD  Revisions to Treatment Plan:  NH versus home    Continued Need for Acute Rehabilitation Level of Care: The patient requires daily medical management by a physician with specialized training in physical medicine and rehabilitation for the following conditions: Daily direction of a multidisciplinary physical rehabilitation program to ensure safe treatment while eliciting the highest outcome that is of practical value to the patient.: Yes Daily medical management of patient stability for increased activity during participation in an intensive rehabilitation regime.: Yes Daily analysis of laboratory values and/or radiology reports with any subsequent need for medication adjustment of medical intervention for : Neurological problems;Other  Elease Hashimoto 06/17/2017, 12:45 PM

## 2017-06-17 NOTE — Consult Note (Signed)
Neuropsychological Consultation   Patient:   [name] [name]   DOB:   Aug 10, 1940  MR Number:  [id number]  Location:  [facility] [id number] [place] [address] Dept: [phone] GUR: [phone]           Date of Service:   06/17/2017  Start Time:   3 PM End Time:   4 PM  Provider/Observer:  Ilean Skill, Psy.D.       Clinical Neuropsychologist       Billing Code/Service: [phone] Units  Chief Complaint:    [name] [name] is a 77 year old female with history of hypertension, polymyositis, diastolic congestive heart failure, colonic AVM.  Presented 06/09/2017 with complaints of generalized numbness and weakness.  Prior baseline was wheelchair-bound and chronic bilateral upper extremity weakness attributed to polymyositis.  MRI showed patchy acute nonhemorrhagic infarct of left frontoparietal MCA territories.  Subacute sub centimeter left thalamus infarction as well as old right basal ganglia and old right thalamus and small infarcts.  Patient does not have good family support for physical needs and son gives most help and is legally blind and works 3 days per week.    Reason for Service:  Patient was referred for neuropsychological consultation due to concerns that cognitive impairments would limit patient's ability to make competent decisions about discharge planning and ability to care for self at level needed due to limitation in family support.  Below is the HPI for the current admission.      [name] [name] a 77 year old right-handed female with history of hypertension, polymyositis, diastolic congestive heart failure, colonic AVM. Presented to [facility] emergency room 06/09/2017 with complaints of generalized numbness and weakness. At baseline patient is essentially wheelchair-bound and has chronic bilateral upper extremity weakness attributed to polymyositis. Son would help  with bed mobility and transfers. One level apartment with level entry. Son works during the day but would help his mother get up in the mornings and get her to the power chair before leaving for work. CT of head reviewed, showing anterior capsule infarction. 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 Subacute subcentimeter left thalamus infarction as well as old right basal ganglia and old right thalamus and small infarcts. MRI cervical spine negative. Patient did not receive TPA. CT angiogram of head and neck with no emergent large vessel occlusion. Incidental findings of multiple right upper lobe pulmonary nodules and recommendations of CT of chest 3 to 6 months. Echocardiogram with ejection fraction of 71% grade 1 diastolic dysfunction. Currently maintained on aspirin and Plavix for CVA prophylaxis x3 months. Subcutaneous Lovenox for DVT prophylaxis. Patient developed upper respiratory infection. Chest x-ray showed no evidence of active pulmonary disease. Started on Augmentin 06/10/2017. Tolerating a regular diet. Physical and occupational therapy evaluations completed with recommendations of physical medicine rehab consult. Patient was admitted for a comprehensive rehab program.  Current Status:  Patient was orientated during clinical interview.  She was able to clearly express her concerns about going to skilled nursing after discharge from [place] [facility] program.  Patient was able to describe other's concern about her ability care for herself when no family can be around for hours at time.  The patient clearly described reasons for wanting to be at home but does state her understanding that now her limitations are to the point that she is reluctant but willing to look at transfer to [place] facility.  Patient  is competent intellectually to make decisions at this time.    Behavioral Observation: [name] [name]  presents as a 77  y.o.-year-old Right African American Female who appeared her stated age. her dress was Appropriate and she was Well Groomed and her manners were Appropriate to the situation.  her participation was indicative of Appropriate and Drowsy behaviors.  There were any physical disabilities noted.  she displayed an appropriate level of cooperation and motivation.     Interactions:    Active Appropriate and Drowsy  Attention:   abnormal and attention span appeared shorter than expected for age  Memory:   abnormal; Mild memory impairments.  Visuo-spatial:  not examined  Speech (Volume):  low  Speech:   normal; normal  Thought Process:  Coherent and Relevant  Though Content:  WNL; not suicidal and not homicidal  Orientation:   person, place, time/date and situation  Judgment:   Fair  Planning:   Fair  Affect:    Blunted  Mood:    Dysphoric  Insight:   Fair  Intelligence:   normal  Medical History:   Past Medical History:  Diagnosis Date  . Allergic rhinitis, cause unspecified   . Benign neoplasm of colon   . Esophageal reflux   . Excessive daytime sleepiness   . 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 GI hemorrhage 2011   recurrent  . Hyperlipidemia   . Osteoporosis   . Polymyositis ([place])   . Unspecified essential hypertension   . Vitamin D deficiency         Psychiatric History:  No prior psychiatric history  Family Med/Psych History:  Family History  Problem Relation Age of Onset  . Dementia Mother   . Hypertension Father   . Malignant hyperthermia Father   . Colon cancer Neg Hx     Risk of Suicide/Violence: low Patient denies SI or HI  Impression/DX:  [name] [name] is a 77 year old female with history of hypertension, polymyositis, diastolic congestive heart failure, colonic AVM.  Presented 06/09/2017 with complaints of generalized numbness and weakness.  Prior baseline was wheelchair-bound and chronic bilateral upper extremity weakness attributed to  polymyositis.  MRI showed patchy acute nonhemorrhagic infarct of left frontoparietal MCA territories.  Subacute sub centimeter left thalamus infarction as well as old right basal ganglia and old right thalamus and small infarcts.  Patient does not have good family support for physical needs and son gives most help and is legally blind and works 3 days per week.    Patient was orientated during clinical interview.  She was able to clearly express her concerns about going to skilled nursing after discharge from [place] program.  Patient was able to describe other's concern about her ability care for herself when no family can be around for hours at time.  The patient clearly described reasons for wanting to be at home but does state her understanding that now her limitations are to the point that she is reluctant but willing to look at transfer to [place] facility.  Patient is competent intellectually to make decisions at this time.     Diagnosis:    Left middle cerebral artery stroke [facility]) - Plan: Ambulatory referral to Physical Medicine Rehab         Electronically Signed   _______________________ Ilean Skill, Psy.D.

## 2017-06-18 ENCOUNTER — Inpatient Hospital Stay (HOSPITAL_COMMUNITY): Payer: Medicare Other | Admitting: Occupational Therapy

## 2017-06-18 ENCOUNTER — Encounter (HOSPITAL_COMMUNITY): Payer: Medicare Other | Admitting: Occupational Therapy

## 2017-06-18 ENCOUNTER — Ambulatory Visit (HOSPITAL_COMMUNITY): Payer: Medicare Other | Admitting: Speech Pathology

## 2017-06-18 ENCOUNTER — Ambulatory Visit (HOSPITAL_COMMUNITY): Payer: Medicare Other

## 2017-06-18 MED ORDER — SODIUM CHLORIDE 0.9% FLUSH
10.0000 mL | INTRAVENOUS | Status: DC | PRN
  Administered 2017-06-18 – 2017-06-25 (×3): 10 mL
  Filled 2017-06-18 (×2): qty 40

## 2017-06-18 NOTE — Progress Notes (Signed)
[facility] PHYSICAL MEDICINE & REHABILITATION     PROGRESS NOTE  Subjective/Complaints:   We discussed her weakness pattern prior to CVA.  She has been in power chair for "a couple years"   ROS: denies CP, SOB, nausea, vomiting, diarrhea.  Objective: Vital Signs: Blood pressure (!) 116/52, pulse 99, temperature 98.4 F (36.9 C), temperature source Oral, resp. rate 18, height 5\' 3"  (1.6 m), weight 68.1 kg (150 lb 2.1 oz), SpO2 98 %. No results found. No results for input(s): WBC, HGB, HCT, PLT in the last 72 hours. No results for input(s): NA, K, CL, GLUCOSE, BUN, CREATININE, CALCIUM in the last 72 hours.  Invalid input(s): CO CBG (last 3)  No results for input(s): GLUCAP in the last 72 hours.  Wt Readings from Last 3 Encounters:  06/18/17 68.1 kg (150 lb 2.1 oz)  06/09/17 77.1 kg (170 lb)  05/15/17 77.1 kg (170 lb)    Physical Exam:  BP (!) 116/52 (BP Location: Left Arm)   Pulse 99   Temp 98.4 F (36.9 C) (Oral)   Resp 18   Ht 5\' 3"  (1.6 m)   Wt 68.1 kg (150 lb 2.1 oz)   SpO2 98%   BMI 26.59 kg/m  Constitutional: She appearswell-developed.  Obese. HENT: Poor dentition.  Normocephalic.  Atraumatic.   Eyes:EOMI.  No discharge.   Cardiovascular:RRR. No JVD. Respiratory:Effort normal.  Clear. GI: She exhibitsno distension.  Bowel sounds normal.   Musculoskeletal: No edema or tenderness in extremities  Neurological: She isalertand oriented. Dysarthria.  Motor: 1/5 bilateral delt , bilateral hip flex, add, abd 2-bilateral knee ext, bilat biceps, 4- left and 0/5 RIght  finger flex ext, bilateral ankle  PF/DF  Assessment/Plan: 1. 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 Physiatrist is providing close team supervision and 24 hour management of active medical problems listed below. Physiatrist and rehab  team continue to assess barriers to discharge/monitor patient progress toward functional and medical goals.  Function:  Bathing Bathing position   Position: Bed  Bathing parts Body parts bathed by patient: Right upper leg, Left upper leg Body parts bathed by helper: Right arm, Left arm, Chest, Abdomen, Front perineal area, Buttocks, Right upper leg, Left upper leg, Right lower leg, Left lower leg, Back  Bathing assist Assist Level: 2 helpers      Upper Body Dressing/Undressing Upper body dressing   What is the patient wearing?: Pull over shirt/dress       Pull over shirt/dress - Perfomed by helper: Thread/unthread right sleeve, Thread/unthread left sleeve, Put head through opening, Pull shirt over trunk        Upper body assist Assist Level: (Total A)      Lower Body Dressing/Undressing Lower body dressing   What is the patient wearing?: Pants, Non-skid slipper socks       Pants- Performed by helper: Thread/unthread right pants leg, Thread/unthread left pants leg, Pull pants up/down   Non-skid slipper socks- Performed by helper: Don/doff right sock, Don/doff left sock                  Lower body assist Assist for lower body dressing: 2 Helpers      Toileting Toileting Toileting activity did not occur: Safety/medical concerns   Toileting steps completed by helper: Adjust clothing prior to toileting, Performs perineal hygiene, Adjust clothing after toileting Toileting Assistive Devices: Grab bar or rail  Toileting assist Assist level: Two helpers(per Haylee Rivay, NT report)   Transfers Chair/bed transfer   Chair/bed transfer method: Lateral scoot Chair/bed transfer assist level: 2 helpers Chair/bed transfer assistive device: Sliding board Mechanical lift: Maximove   Locomotion Ambulation Ambulation activity did not occur: Safety/medical concerns         Wheelchair   Type: Motorized Max wheelchair distance: 75 ft Assist Level: Supervision or verbal  cues  Cognition Comprehension Comprehension assist level: Follows basic conversation/direction with extra time/assistive device  Expression Expression assist level: Expresses basic needs/ideas: With extra time/assistive device  Social Interaction Social Interaction assist level: Interacts appropriately 75 - 89% of the time - Needs redirection for appropriate language or to initiate interaction.  Problem Solving Problem solving assist level: Solves basic 50 - 74% of the time/requires cueing 25 - 49% of the time  Memory Memory assist level: Recognizes or recalls 75 - 89% of the time/requires cueing 10 - 24% of the time    Medical Problem List and Plan: 1.Decreased functional mobilitysecondary to left frontoparietal MCA infarction on 0/17 complicated by history of polymyositis  Continue CIR PT, OT, SLP-tent d/c 6/14 2. DVT Prophylax notes reviewed, labs reviewed, is/Anticoagulation: Subcutaneous Lovenox. Monitor for any bleeding episodes 3. Pain Management:Neurontin 100 mg twice daily Tylenol as needed 4. Mood:Xanax 0.25 mg twice daily as needed 5. Neuropsych: This patientiscapable of making decisions on herown behalf. 6. Skin/Wound Care:Routine skin checks 7. Fluids/Electrolytes/Nutrition:Routine in and outs   6/3 HypoK- cont KCL 36meq daily, recheck in am  6/1 creat< 0.3 8.Polymyositis. Continue CellCept 500 mg twice daily. Patient has been wheelchair-bound for approximately the past 8 months although the pt states "a couple years"  Has shoulder and hip girdle weakness, no active inflammation /pain 9.Chronic diastolic congestive heart failure. Monitor for any signs of fluid overload Filed Weights   06/16/17 0340 06/17/17 0457 06/18/17 0500  Weight: 68 kg (149 lb 14.6 oz) 68.1 kg (150 lb 2.1 oz) 68.1 kg (150 lb 2.1 oz)   Daily weights ordered 10.Hypertension. Tenormin 50 mg daily, Lasix 40 mg daily prior to admission.  Vitals:   06/17/17 1956 06/18/17 0553  BP: 120/70  (!) 116/52  Pulse: 82 99  Resp: 18 18  Temp:  98.4 F (36.9 C)  SpO2: 98% 98%    controlled on 6/6  11.Pulmonary nodules. Incidental finding on CTA of neck. Noncontrast CT 3 to 6 months 12.Hyperlipidemia. Lipitor 13.  Acute blood loss anemia   Hemoglobin 10.3 on 6/1   Continue to monitor 14.   LOS (Days) 6 A FACE TO FACE EVALUATION WAS PERFORMED  Charlett Blake 06/18/2017 8:21 AM

## 2017-06-18 NOTE — Progress Notes (Signed)
Speech Language Pathology Daily Session Note  Patient Details  Name: [name] [name] MRN: [id number] Date of Birth: 1940-11-06  Today's Date: 06/18/2017 SLP Individual Time: 0730-0830 SLP Individual Time Calculation (min): 60 min  Short Term Goals: Week 1: SLP Short Term Goal 1 (Week 1): Pt will consume regular textured trials with efficitent mastication and no overt aspiration to demonstrate readiness for diet advancement. SLP Short Term Goal 1 - Progress (Week 1): Met SLP Short Term Goal 2 (Week 1): Pt will demonstrate functional problem solving for familar mildly complex tasks with supervision A verbal and question cues. SLP Short Term Goal 3 (Week 1): Pt will identitfy 3 activties pt can and can not participate in at discharge due to current physical deficits given supervision A verbal cues,   Skilled Therapeutic Interventions:  Skilled treatment session focused on cognition goals. SLP facilitated session by providing 5 minute delayed recall of items/information. Pt required Max A to recall ~ 25% of information. Pt with increased anticipatory awareness as indicated by need for Min A questions cues to list ways that she would need assistance at discharge. Pt was left upright in bed, bed alarm on and all needs within reach. Continue per current plan of care.      Function:  Eating Eating   Modified Consistency Diet: No Eating Assist Level: Set up assist for;Supervision or verbal cues;More than reasonable amount of time   Eating Set Up Assist For: Opening containers;Cutting food Helper Scoops Food on Utensil: Every scoop Helper Brings Food to Mouth: Every scoop   Cognition Comprehension Comprehension assist level: Follows basic conversation/direction with extra time/assistive device  Expression   Expression assist level: Expresses basic needs/ideas: With extra time/assistive device  Social Interaction Social Interaction assist level: Interacts appropriately 75 - 89% of the time -  Needs redirection for appropriate language or to initiate interaction.  Problem Solving Problem solving assist level: Solves basic 50 - 74% of the time/requires cueing 25 - 49% of the time  Memory Memory assist level: Recognizes or recalls 75 - 89% of the time/requires cueing 10 - 24% of the time    Pain    Therapy/Group: Individual Therapy  Joni Colegrove 06/18/2017, 10:56 AM

## 2017-06-18 NOTE — Progress Notes (Signed)
Physical Therapy Session Note  Patient Details  Name: [name] [name] MRN: [id number] Date of Birth: Feb 12, 1940  Today's Date: 06/18/2017 PT Individual Time: 1000-1105 PT Individual Time Calculation (min): 65 min   Short Term Goals: Week 1:  PT Short Term Goal 1 (Week 1): Pt will perform bed mobility w/ mod assist PT Short Term Goal 2 (Week 1): Pt will perform sit<>stand w/ max assist x2 for LE strengthening PT Short Term Goal 3 (Week 1): Pt will maintain dynamic sitting balance w/ supervision PT Short Term Goal 4 (Week 1): Pt will perform bed<>chair transfer w/ max assist x1  Skilled Therapeutic Interventions/Progress Updates:    Focused on NMR to address postural control, cervical strengthening to neutral, weightbearing the BLE, stretching to BLE (significant limitations in passive ROM of knee extension, hip extension, and ankle dorsiflexion) and motor control/activation in RUE biceps/triceps while on tilt table (utilized maxi move for transfer on and off) with min to max assist for anterior and lateral weightshifts for placement of sling. Pt able to tolerate up to 50 degrees for small periods of time limited by "pain" from stretching. Education to patient and then later to family when they arrived about her ROM limitations due to sedentary nature over last several years affecting her overall ability to tolerate upright position as well as old and new onset of weakness of trunk musculature and R > L sided weakness. In supine on table during rest breaks, engaged in bridging x 10 reps and then x 10 reps with attempt for 5 second hold. Pt with max encouragement needed to continue to participate in all activities throughout session and education on importance of therapies. Son and son in law arrived and then last 20 min of session, pt's son also present. Educated family on all this as well. w/c mobility training in power w/c with LUE control with supervision including setting up w/c in room next to bed  to prepare for transfer. Upon return to room, had pt's son go through and demonstrate his transfer technique (required assist for set up due to unfamiliar environment and pt's son is blind) but he was able to demonstrate dependent squat pivot transfer power w/c <> bed. Educated on options of use of slideboard (how she has been transferring here in therapy) to increase her functional ability to participate in transfers. Demonstrated tilt feature on w/c to pt for pressure relief and positioning for OOB tolerance. Handoff to CSW and family in room.   Therapy Documentation Precautions:  Precautions Precautions: Fall Type of Shoulder Precautions: R shoulder weakness and subluxation most recent stroke Restrictions Weight Bearing Restrictions: No  Pain:  Denies pain at rest. Reports pain in BLE with weightbearing/stretching. Rest breaks as able.    See Function Navigator for Current Functional Status.   Therapy/Group: Individual Therapy  Canary Brim Ivory Broad, PT, DPT  06/18/2017, 11:09 AM

## 2017-06-18 NOTE — Progress Notes (Signed)
Social Work Patient ID: [name] [name], female   DOB: 09/03/1940, 76 y.o.   MRN: [id number]  Met with pt, son and son in-law who were here to attend therapies with pt. Discussed once again discharge plans and pt has agreed to go to [facility] if they can offer her a bed on 6/12. Informed all may need another option in case [facility] has no bed or can not offer pt a bed. Daughter to research some other facilities and get back with this worker. All can see pt's hand does not move as well as it did prior to stroke. Will look into [facility] for available bed.  

## 2017-06-18 NOTE — Progress Notes (Signed)
Occupational Therapy Session Note  Patient Details  Name: [name] [name] MRN: [id number] Date of Birth: 02-Sep-1940  Today's Date: 06/18/2017 OT Individual Time: 0900-1000 OT Individual Time Calculation (min): 60 min    Short Term Goals: Week 1:  OT Short Term Goal 1 (Week 1): Pt will perform UB dressing with max A.  OT Short Term Goal 2 (Week 1): Pt will transfer onto toilet with one person for assistance.  Skilled Therapeutic Interventions/Progress Updates:    Pt seen for OT session focusing on functional mobility, ADL re-training, and attempts at standing for LE WBing. Pt in supine upon arrival, agreeable to tx session. She declined need to change clothes this morning.  She transferred to EOB with max A +1. Required +2 total A for sliding board transfer into power w/c.  Completed grooming tasks from w/c level at sink. Pt cont to be inconsistent in her reports of PLOF. Reporting now that L UE is at weaker level than PTA, however, no neurological reasoning for this and continues to be weaker proximal to distal. She completed grooming tasks in modified manner due to proximal weakness and assist provided at elbow for gravity eliminated position.  Pt able to drive power chair to therapy day room with distant supervision. Attempted x2 trials of standing with use of standing frame, however, due to complaints of R knee pain and trunkal weakness, pt unable to obtain erect or upright posture in standing frame and cont to yell "put me back down" once buttock cleared from chair despite +3 assist.  Pt returned to w/c and left with hand off to PT, plans to attempt tilt table for LE WBing.   Therapy Documentation Precautions:  Precautions Precautions: Fall Type of Shoulder Precautions: R shoulder weakness and subluxation most recent stroke Restrictions Weight Bearing Restrictions: No Pain:    See Function Navigator for Current Functional Status.   Therapy/Group: Individual Therapy  Taura Lamarre  L 06/18/2017, 7:00 AM

## 2017-06-18 NOTE — Progress Notes (Signed)
Occupational Therapy Session Note  Patient Details  Name: [name] [name] MRN: [id number] Date of Birth: 10-25-1940  Today's Date: 06/18/2017 OT Individual Time: 1400-1430 OT Individual Time Calculation (min): 30 min    Short Term Goals: Week 1:  OT Short Term Goal 1 (Week 1): Pt will perform UB dressing with max A.  OT Short Term Goal 2 (Week 1): Pt will transfer onto toilet with one person for assistance.  Skilled Therapeutic Interventions/Progress Updates:    1:1 Focus on [facility] and shoulder/ scapular mobility with functional reach and grasp of large pegs. Able to grasp and put into a bucket with min A for gravity assisted- pt able to initiate movement. Transitioned to left UE performing task and unable to use bicep/ tricep activation to put pegs in the box after obtaining them. Pt continues to practice driving power chair and able to perform at slow speed. A to get into tilt position back in room.   Therapy Documentation Precautions:  Precautions Precautions: Fall Type of Shoulder Precautions: R shoulder weakness and subluxation most recent stroke Restrictions Weight Bearing Restrictions: No Pain:  no c/o pain   See Function Navigator for Current Functional Status.   Therapy/Group: Individual Therapy  Willeen Cass [facility] 06/18/2017, 4:04 PM

## 2017-06-18 NOTE — NC FL2 (Signed)
[place] LEVEL OF CARE SCREENING TOOL     IDENTIFICATION  Patient Name: [name] [name] Birthdate: 08/27/1940 Sex: female Admission Date (Current Location): 06/12/2017  [facility] and [place] Number:  Herbalist and Address:  [facility], [place] [address], [place], [place] [address]      Provider Number: [id number]  Attending Physician Name and Address:  Charlett Blake, MD  Relative Name and Phone Number:  Hal Morales Briceno-daughter [phone]    Current Level of Care: Other (Comment)(rehab) Recommended Level of Care: [place] Prior Approval Number:    Date Approved/Denied:   PASRR Number: [id number]  Discharge Plan: SNF    Current Diagnoses: Patient Active Problem List   Diagnosis Date Noted  . Neuropathic pain   . 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 Neurologic deficit due to acute ischemic stroke ([place])   . Hypokalemia   . Chronic diastolic congestive heart failure ([place])   . AVM (arteriovenous malformation)   . CVA (cerebral vascular accident) ([place]) 06/09/2017  . Multiple pulmonary nodules 06/09/2017  . Hypothermia 10/22/2016  . Chronic diastolic CHF (congestive heart failure) ([place]) 10/22/2016  . Abdominal pain, unspecified site 05/17/2013  . Nausea alone 05/17/2013  . 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 Abdominal pain, left lower quadrant 05/28/2011  . Cecal ulcer with hemorrhage 01/10/2011  . Acute blood loss anemia 12/06/2010  . Hematochezia 12/05/2010  . Anemia 12/05/2010  . Adrenal insufficiency ([place]) 12/05/2010  . Osteoporosis 12/05/2010  . LUNG INVOLVEMENT OTHER DISEASES CLASSIFIED ELSW 08/13/2009  . OTHER SPECIFIED DISORDER OF STOMACH AND DUODENUM 07/06/2008  . Essential hypertension 04/26/2007  . ALLERGIC RHINITIS 04/26/2007  . GASTROESOPHAGEAL REFLUX DISEASE 04/26/2007  . Polymyositis ([place]) 04/26/2007  . COLONIC POLYPS,  ADENOMATOUS 02/06/2005    Orientation RESPIRATION BLADDER Height & Weight     Self, Time, Situation, Place  Normal Continent Weight: 150 lb 2.1 oz (68.1 kg) Height:  5\' 3"  (160 cm)  BEHAVIORAL SYMPTOMS/MOOD NEUROLOGICAL BOWEL NUTRITION STATUS      Continent Diet(regular diet)  AMBULATORY STATUS COMMUNICATION OF NEEDS Skin   Total Care Verbally Normal                       Personal Care Assistance Level of Assistance  Bathing, Feeding, Dressing Bathing Assistance: Maximum assistance Feeding assistance: Limited assistance Dressing Assistance: Maximum assistance     Functional Limitations Info             SPECIAL CARE FACTORS FREQUENCY  PT (By licensed PT), OT (By licensed OT), Speech therapy     PT Frequency: 5x week OT Frequency: 5x week     Speech Therapy Frequency: 5x week      Contractures Contractures Info: Not present    Additional Factors Info  Code Status, Allergies Code Status Info: Full Code Allergies Info: Codeine, Hydrocodone, Tramadol           Current Medications (06/18/2017):  This is the current hospital active medication list Current Facility-Administered Medications  Medication Dose Route Frequency Provider Last Rate Last Dose  . acetaminophen (TYLENOL) tablet 650 mg  650 mg Oral Q4H PRN Angiulli, Lavon Paganini, PA-C       Or  . acetaminophen (TYLENOL) solution 650 mg  650 mg Per Tube Q4H PRN Angiulli, Lavon Paganini, PA-C       Or  . acetaminophen (TYLENOL) suppository 650 mg  650 mg Rectal Q4H PRN Angiulli,  Lavon Paganini, PA-C      . ALPRAZolam Duanne Moron) tablet 0.5 mg  0.5 mg Oral BID PRN Charlett Blake, MD   0.5 mg at 06/17/17 2237  . alum & mag hydroxide-simeth (MAALOX/MYLANTA) 200-200-20 MG/5ML suspension 30 mL  30 mL Oral BID PRN Jamse Arn, MD   30 mL at 06/15/17 0830  . aspirin chewable tablet 81 mg  81 mg Oral Daily Cathlyn Parsons, PA-C   81 mg at 06/18/17 0759  . atenolol (TENORMIN) tablet 50 mg  50 mg Oral Daily Cathlyn Parsons, PA-C   50 mg at 06/18/17 0759  . atorvastatin (LIPITOR) tablet 10 mg  10 mg Oral q1800 AngiulliLavon Paganini, PA-C   10 mg at 06/17/17 1711  . calcium carbonate (OS-CAL - dosed in mg of elemental calcium) tablet 1,000 mg of elemental calcium  2 tablet Oral Daily Angiulli, Lavon Paganini, PA-C   1,000 mg of elemental calcium at 06/18/17 0757  . Chlorhexidine Gluconate Cloth 2 % PADS 6 each  6 each Topical Q0600 Jamse Arn, MD   6 each at 06/18/17 0550  . clopidogrel (PLAVIX) tablet 75 mg  75 mg Oral Daily Cathlyn Parsons, PA-C   75 mg at 06/18/17 0758  . enoxaparin (LOVENOX) injection 40 mg  40 mg Subcutaneous Q24H Kirsteins, Luanna Salk, MD   40 mg at 06/18/17 0550  . febuxostat (ULORIC) tablet 40 mg  40 mg Oral Daily Cathlyn Parsons, PA-C   40 mg at 06/18/17 0757  . feeding supplement (ENSURE ENLIVE) (ENSURE ENLIVE) liquid 237 mL  237 mL Oral BID BM AngiulliLavon Paganini, PA-C   237 mL at 06/18/17 1029  . fluticasone (FLONASE) 50 MCG/ACT nasal spray 1 spray  1 spray Each Nare Daily PRN Angiulli, Lavon Paganini, PA-C      . furosemide (LASIX) tablet 40 mg  40 mg Oral Daily Cathlyn Parsons, PA-C   40 mg at 06/18/17 0759  . gabapentin (NEURONTIN) capsule 100 mg  100 mg Oral BID Cathlyn Parsons, PA-C   100 mg at 06/18/17 0759  . mycophenolate (CELLCEPT) capsule 500 mg  500 mg Oral BID Cathlyn Parsons, PA-C   500 mg at 06/18/17 0758  . pantoprazole (PROTONIX) EC tablet 40 mg  40 mg Oral Daily Cathlyn Parsons, PA-C   40 mg at 06/18/17 0758  . potassium chloride SA (K-DUR,KLOR-CON) CR tablet 40 mEq  40 mEq Oral Daily Cathlyn Parsons, PA-C   40 mEq at 06/18/17 0758  . senna-docusate (Senokot-S) tablet 2 tablet  2 tablet Oral BID Charlett Blake, MD   2 tablet at 06/18/17 9937     Discharge Medications: Please see discharge summary for a list of discharge medications.  Relevant Imaging Results:  Relevant Lab Results:   Additional Information SSN: [id number]  Carolin Quang, Gardiner Rhyme,  LCSW

## 2017-06-18 NOTE — Progress Notes (Signed)
Nutrition Follow-up  DOCUMENTATION CODES:   Non-severe (moderate) malnutrition in context of chronic illness, Obesity unspecified  INTERVENTION:  Continue Ensure Enlive po BID, each supplement provides 350 kcal and 20 grams of protein.  Encourage adequate PO intake.   NUTRITION DIAGNOSIS:   Moderate Malnutrition related to chronic illness(polymoysitis) as evidenced by mild fat depletion, moderate fat depletion, mild muscle depletion, moderate muscle depletion; ongoing  GOAL:   Patient will meet greater than or equal to 90% of their needs; progressing  MONITOR:   PO intake, Supplement acceptance, Weight trends, Labs  REASON FOR ASSESSMENT:   Malnutrition Screening Tool    ASSESSMENT:   77 year old right-handed female with history of hypertension, polymyositis, diastolic congestive heart failure, colonic AVM.  Presented to [facility] emergency room 06/09/2017 with complaints of generalized numbness and weakness.  At baseline patient is essentially wheelchair-bound and has chronic bilateral upper extremity weakness attributed to polymyositis.  Meal completion has been 20-80% with 75% intake at breakfast and lunch today. Pt currently has Ensure ordered and has been consuming them. RD to continue with current orders to aid in caloric and protein needs. Pt encouraged to eat her food at meals and to drink her supplements.   Diet Order:   Diet Order           Diet regular Room service appropriate? Yes; Fluid consistency: Thin  Diet effective 1000          EDUCATION NEEDS:   No education needs have been identified at this time  Skin:  Skin Assessment: Reviewed RN Assessment  Last BM:  6/5  Height:   Ht Readings from Last 1 Encounters:  06/12/17 5\' 3"  (1.6 m)    Weight:   Wt Readings from Last 1 Encounters:  06/18/17 150 lb 2.1 oz (68.1 kg)    Ideal Body Weight:  52.27 kg  BMI:  Body mass index is 26.59 kg/m.  Estimated Nutritional Needs:   Kcal:   1700-1850  Protein:  75-90 grams  Fluid:  1.7 - 1.8 L/day    Corrin Parker, MS, RD, LDN Pager # [phone] After hours/ weekend pager # [phone]

## 2017-06-19 ENCOUNTER — Inpatient Hospital Stay (HOSPITAL_COMMUNITY): Payer: Medicare Other | Admitting: Physical Therapy

## 2017-06-19 ENCOUNTER — Inpatient Hospital Stay (HOSPITAL_COMMUNITY): Payer: Medicare Other | Admitting: Occupational Therapy

## 2017-06-19 ENCOUNTER — Inpatient Hospital Stay (HOSPITAL_COMMUNITY): Payer: Medicare Other | Admitting: Speech Pathology

## 2017-06-19 LAB — CREATININE, SERUM: Creatinine, Ser: 0.78 mg/dL (ref 0.44–1.00)

## 2017-06-19 NOTE — Progress Notes (Signed)
Physical Therapy Session Note  Patient Details  Name: [name] [name] MRN: [id number] Date of Birth: Nov 28, 1940  Today's Date: 06/19/2017 PT Individual Time: 4580-9983 and 3825-0539 PT Individual Time Calculation (min): 71 min and 26 min   Short Term Goals: Week 1:  PT Short Term Goal 1 (Week 1): Pt will perform bed mobility w/ mod assist PT Short Term Goal 2 (Week 1): Pt will perform sit<>stand w/ max assist x2 for LE strengthening PT Short Term Goal 3 (Week 1): Pt will maintain dynamic sitting balance w/ supervision PT Short Term Goal 4 (Week 1): Pt will perform bed<>chair transfer w/ max assist x1  Skilled Therapeutic Interventions/Progress Updates:  Treatment 1: Pt received in power w/c & agreeable to to tx. Pt maneuvers power w/c throughout unit with LUE on control and supervision. Pt gave this therapist permission to contact [employer] to discuss having her current power w/c modified. Per [employer] representative they cannot transport the pt's personal chair 2/2 liability reasons. Their recommendation is that pt/therapist contact them immediately prior to her discharging from next venue of care (SNF) so they can have pt present and make w/c modifications prior to her returning home. Pt & LCSW [facility]) made aware of this information. Pt reported "I just don't feel good" but unable to elaborate except for pointing to L ribcage- therapist suggested repositioning w/c and pt required total assist to operate w/c controls to do so. Pt then transferred w/c<>mat table, always going to lower height surface, using slide board. Pt transferred w/c>mat with +1 total assist but required +2 total assist when returning to w/c. Pt able to place LUE with assist but unable to assist with transfer 2/2 very poor trunk control (therapist provides significant assist to prevent anterior LOB with anterior weight shifting) and significant weakness. While sitting EOM pt able to maintain static sitting balance with supervision  and dynamic with close supervision/occasional steady assist. Pt engaged in reaching to touch objects to challenge dynamic balance but difficulty challenging balance in this manner 2/2 pt's limited strength & ROM in BUE. Pt attempted to activate RUE on occasion but with little functional use in extremity. Pt's family arrived during 2nd half of session to observe and therapist educated them on stroke recovery. At end of session pt left sitting in power w/c with seat belt donned, family present to supervise, and call bell in reach.   Treatment 2: Pt received in w/c with family exiting upon PT arrival. Session focused on maneuvering power w/c with LUE (nondominant hand) in hospital and simulated home environment. Pt maneuvered w/c room<>gym with supervision, then between cones to simulate narrow spaces with extra time and decreased speed to avoid hitting obstacles but with supervision from therapist. Pt able to maneuver between all cones, only hitting 3. At end of session pt left in w/c with seat belt donned & needs within reach.  Therapy Documentation Precautions:  Precautions Precautions: Fall Type of Shoulder Precautions: R shoulder weakness and subluxation most recent stroke Restrictions Weight Bearing Restrictions: No    See Function Navigator for Current Functional Status.   Therapy/Group: Individual Therapy  Waunita Schooner 06/19/2017, 4:08 PM

## 2017-06-19 NOTE — Progress Notes (Signed)
[place] PHYSICAL MEDICINE & REHABILITATION     PROGRESS NOTE  Subjective/Complaints:   Appreciate dietary note Pt states she was diagnosed with PM ~20 yr, pt also states "I'm weak from the steroids"    ROS: denies CP, SOB, nausea, vomiting, diarrhea.  Objective: Vital Signs: Blood pressure (!) 99/48, pulse 88, temperature 98.1 F (36.7 C), temperature source Oral, resp. rate 18, height 5\' 3"  (1.6 m), weight 66.9 kg (147 lb 7.8 oz), SpO2 98 %. No results found. No results for input(s): WBC, HGB, HCT, PLT in the last 72 hours. Recent Labs    06/19/17 0415  CREATININE 0.78   CBG (last 3)   No results for input(s): GLUCAP in the last 72 hours.  Wt Readings from Last 3 Encounters:  06/19/17 66.9 kg (147 lb 7.8 oz)  06/09/17 77.1 kg (170 lb)  05/15/17 77.1 kg (170 lb)    Physical Exam:  BP (!) 99/48 (BP Location: Right Arm)   Pulse 88   Temp 98.1 F (36.7 C) (Oral)   Resp 18   Ht 5\' 3"  (1.6 m)   Wt 66.9 kg (147 lb 7.8 oz)   SpO2 98%   BMI 26.13 kg/m  Constitutional: She appearswell-developed.  Obese. HENT: Poor dentition.  Normocephalic.  Atraumatic.   Eyes:EOMI.  No discharge.   Cardiovascular:RRR. No JVD. Respiratory:Effort normal.  Clear. GI: She exhibitsno distension.  Bowel sounds normal.   Musculoskeletal: No edema or tenderness in extremities  Neurological: She isalertand oriented. Dysarthria.  Motor: 1/5 bilateral delt , bilateral hip flex, add, abd 2-bilateral knee ext, bilat biceps, 4- left and 0/5 RIght  finger flex ext, bilateral ankle  PF/DF  Assessment/Plan: 1. 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 Physiatrist is providing close team supervision and 24 hour management of active medical problems listed below. Physiatrist and rehab team continue to assess barriers to  discharge/monitor patient progress toward functional and medical goals.  Function:  Bathing Bathing position   Position: Bed  Bathing parts Body parts bathed by patient: Right upper leg, Left upper leg Body parts bathed by helper: Right arm, Left arm, Chest, Abdomen, Front perineal area, Buttocks, Right upper leg, Left upper leg, Right lower leg, Left lower leg, Back  Bathing assist Assist Level: 2 helpers      Upper Body Dressing/Undressing Upper body dressing   What is the patient wearing?: Pull over shirt/dress       Pull over shirt/dress - Perfomed by helper: Thread/unthread right sleeve, Thread/unthread left sleeve, Put head through opening, Pull shirt over trunk        Upper body assist Assist Level: (Total A)      Lower Body Dressing/Undressing Lower body dressing   What is the patient wearing?: Pants, Non-skid slipper socks       Pants- Performed by helper: Thread/unthread right pants leg, Thread/unthread left pants leg, Pull pants up/down   Non-skid slipper socks- Performed by helper: Don/doff right sock, Don/doff left sock                  Lower body assist Assist for lower body dressing: 2 Helpers      Toileting Toileting Toileting activity did not occur: Safety/medical concerns   Toileting steps completed by helper: Adjust clothing prior to toileting, Performs perineal hygiene, Adjust clothing after toileting Toileting Assistive Devices: Grab bar or rail  Toileting assist Assist level:  Two helpers   Transfers Chair/bed transfer   Chair/bed transfer method: Lateral scoot Chair/bed transfer assist level: 2 helpers Chair/bed transfer assistive device: Sliding board Mechanical lift: Maximove   Locomotion Ambulation Ambulation activity did not occur: Safety/medical concerns         Wheelchair   Type: Motorized Max wheelchair distance: 100' Assist Level: Supervision or verbal cues  Cognition Comprehension Comprehension assist level: Follows  basic conversation/direction with extra time/assistive device  Expression Expression assist level: Expresses basic needs/ideas: With extra time/assistive device  Social Interaction Social Interaction assist level: Interacts appropriately 75 - 89% of the time - Needs redirection for appropriate language or to initiate interaction.  Problem Solving Problem solving assist level: Solves basic 50 - 74% of the time/requires cueing 25 - 49% of the time  Memory Memory assist level: Recognizes or recalls 75 - 89% of the time/requires cueing 10 - 24% of the time    Medical Problem List and Plan: 1.Decreased functional mobilitysecondary to left frontoparietal MCA infarction on 0/17 complicated by history of polymyositis  Continue CIR PT, OT, SLP-tent d/c 6/14 but now for SNF , FL2 signed stable when bed availiable 2. DVT Prophylax notes reviewed, labs reviewed, is/Anticoagulation: Subcutaneous Lovenox. Monitor for any bleeding episodes 3. Pain Management:Neurontin 100 mg twice daily Tylenol as needed 4. Mood:Xanax 0.25 mg twice daily as needed 5. Neuropsych: This patientiscapable of making decisions on herown behalf. 6. Skin/Wound Care:Routine skin checks 7. Fluids/Electrolytes/Nutrition:Routine in and outs   6/3 HypoK- cont KCL 63meq daily, recheck in am  6/1 creat< 0.3 8.Polymyositis. Continue CellCept 500 mg twice daily. 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 Monitor for any signs of fluid overload Filed Weights   06/17/17 0457 06/18/17 0500 06/19/17 0432  Weight: 68.1 kg (150 lb 2.1 oz) 68.1 kg (150 lb 2.1 oz) 66.9 kg (147 lb 7.8 oz)   Daily weights ordered 10.Hypertension. Tenormin 50 mg daily, Lasix 40 mg daily prior to admission.  Vitals:   06/18/17 2051 06/19/17  0435  BP: (!) 109/53 (!) 99/48  Pulse: 85 88  Resp: 18 18  Temp: 98.2 F (36.8 C) 98.1 F (36.7 C)  SpO2: 100% 98%    controlled on 6/7 11.Pulmonary nodules. Incidental finding on CTA of neck. Noncontrast CT 3 to 6 months 12.Hyperlipidemia. Lipitor 13.  Acute blood loss anemia   Hemoglobin 10.3 on 6/1   Continue to monitor 14.   LOS (Days) 7 A FACE TO FACE EVALUATION WAS PERFORMED  Charlett Blake 06/19/2017 8:04 AM

## 2017-06-19 NOTE — Progress Notes (Signed)
Speech Language Pathology Weekly Progress and Session Note  Patient Details  Name: [name] [name] MRN: [id number] Date of Birth: 1940-04-06  Beginning of progress report period: Jun 12, 2017  End of progress report period: June 19, 2017  Today's Date: 06/19/2017 SLP Individual Time: 0835-0900 SLP Individual Time Calculation (min): 25 min  Short Term Goals: Week 1: SLP Short Term Goal 1 (Week 1): Pt will consume regular textured trials with efficitent mastication and no overt aspiration to demonstrate readiness for diet advancement. SLP Short Term Goal 1 - Progress (Week 1): Met SLP Short Term Goal 2 (Week 1): Pt will demonstrate functional problem solving for familar mildly complex tasks with supervision A verbal and question cues. SLP Short Term Goal 2 - Progress (Week 1): Progressing toward goal SLP Short Term Goal 3 (Week 1): Pt will identitfy 3 activties pt can and can not participate in at discharge due to current physical deficits given supervision A verbal cues,  SLP Short Term Goal 3 - Progress (Week 1): Progressing toward goal    New Short Term Goals: Week 2: SLP Short Term Goal 1 (Week 2): STG=LTG due to remaining length of stay  Weekly Progress Updates:   Pt has made no gains this reporting period and has met 1 out of 3 short term goals.  Pt is currently mod assist for tasks due to moderate cognitive deficits.  Pt is consuming regular diet, thin liquids with mod I  use of swallowing precautions.  Pt and family education is ongoing.  Pt and family members all provide inconsistent reports of pt's baseline level of function; however, suspect that since pt was dependent for all her care at baseline per PMR pre-admission report that pt is not vastly different from prior to admission.  Will downgrade goals to min assist to account for minimal progress made while inpatient.  Pt is now awaiting SNF placement.    Intensity: Minumum of 1-2 x/day, 30 to 90 minutes Frequency: 3 to 5 out of 7  days Duration/Length of Stay: 19-21 days  Treatment/Interventions: Cognitive remediation/compensation;Cueing hierarchy;Dysphagia/aspiration precaution training;Functional tasks;Patient/family education   Daily Session  Skilled Therapeutic Interventions: Pt was seen for skilled ST targeting cognitive goals.  SLP facilitated the session with structured conversations centered around a familiar recipe to address verbal problem solving goals.  Pt needed min-mod assist verbal cues for thoroughness and task organization when describing how to make lasagna.  Pt was left in bed with bed alarm set and call bell within reach.  Goals updated on this date to reflect current progress and plan of care.       Function:   Eating Eating                 Cognition Comprehension Comprehension assist level: Follows basic conversation/direction with extra time/assistive device  Expression   Expression assist level: Expresses basic needs/ideas: With extra time/assistive device  Social Interaction Social Interaction assist level: Interacts appropriately 75 - 89% of the time - Needs redirection for appropriate language or to initiate interaction.  Problem Solving Problem solving assist level: Solves basic 50 - 74% of the time/requires cueing 25 - 49% of the time  Memory Memory assist level: Recognizes or recalls 75 - 89% of the time/requires cueing 10 - 24% of the time   General    Pain Pain Assessment Pain Scale: 0-10 Pain Score: 0-No pain  Therapy/Group: Individual Therapy  Mandel Seiden, Selinda Orion 06/19/2017, 9:04 AM

## 2017-06-19 NOTE — Progress Notes (Signed)
Occupational Therapy Session Note  Patient Details  Name: [name] [name] MRN: [id number] Date of Birth: 07/16/1940  Today's Date: 06/19/2017 OT Individual Time: 1859-0931 OT Individual Time Calculation (min): 60 min    Short Term Goals: Week 1:  OT Short Term Goal 1 (Week 1): Pt will perform UB dressing with max A.  OT Short Term Goal 1 - Progress (Week 1): Met OT Short Term Goal 2 (Week 1): Pt will transfer onto toilet with one person for assistance. OT Short Term Goal 2 - Progress (Week 1): Not met  Skilled Therapeutic Interventions/Progress Updates:    1:1 Engaged in self care retraining at EOB to include bed mobility and dressing at EOB. Pt required max A to come to EOB . Pt required max to total A to maintain static to dynamic sitting balance to participate in dressing with lateral leans. Pt unable to hold a lateral lean position today - resulting in more sidelying during LB clothing management. Focus on trying to incorporate right hand at stabilizer with mod A. Pt performed slide board transfer with total A with maintaining a forward weight shift. Pt able to successful drive her power chair to the dayroom and up to a table. Engaged in functional use of right hand with functional reach and initiating movement in all planes with decr compensatory movements.   Also engaged in self feeding with left hand with table raised to her mid upper chest and right hand stabilized on the edge of bedside table to feed herself. Only occasionally needed assistance to stab food.  Pt able to return to her room via power chair with distant supervision with extra time at a slow speed.    Therapy Documentation Precautions:  Precautions Precautions: Fall Type of Shoulder Precautions: R shoulder weakness and subluxation most recent stroke Restrictions Weight Bearing Restrictions: No Pain:  no c/o in session  See Function Navigator for Current Functional Status.   Therapy/Group: Individual  Therapy  Willeen [facility] 06/19/2017, 1:47 PM

## 2017-06-19 NOTE — Progress Notes (Signed)
Physical Therapy Weekly Progress Note  Patient Details  Name: [name] [name] MRN: [id number] Date of Birth: 04/12/1940  Beginning of progress report period: June 13, 2017 End of progress report period: June 19, 2017  Today's Date: 06/19/2017    Patient has met 0 of 4 short term goals.  Pt is making slow progress towards all goals 2/2 decreased strength, endurance, activity tolerance, and PLOF limitations. Focus of physical therapy treatment is on bed mobility, sitting balance, transfers, ROM, activity tolerance, pt/family education, NMR, and d/c planning. Have attempted to gain acces to pt's power w/c (either through nu-motion or family) to make adjustments as pt would benefit from control being on L side 2/2 new R hemiplegia. This therapist recommends that pt have 24 hr care upon d/c.  Patient continues to demonstrate the following deficits muscle weakness and muscle joint tightness, decreased cardiorespiratoy endurance, decreased coordination, decreased safety awareness, and decreased sitting balance, decreased postural control, hemiplegia and decreased balance strategies and therefore will continue to benefit from skilled PT intervention to increase functional independence with mobility.  Patient not progressing toward long term goals.  See goal revision..  Plan of care revisions: power w/c goals downgraded to supervision.  PT Short Term Goals Week 1:  PT Short Term Goal 1 (Week 1): Pt will perform bed mobility w/ mod assist PT Short Term Goal 1 - Progress (Week 1): Not met PT Short Term Goal 2 (Week 1): Pt will perform sit<>stand w/ max assist x2 for LE strengthening PT Short Term Goal 2 - Progress (Week 1): Not met PT Short Term Goal 3 (Week 1): Pt will maintain dynamic sitting balance w/ supervision PT Short Term Goal 3 - Progress (Week 1): Not met PT Short Term Goal 4 (Week 1): Pt will perform bed<>chair transfer w/ max assist x1 PT Short Term Goal 4 - Progress (Week 1): Not met Week 2:   PT Short Term Goal 1 (Week 2): STG = LTG due to estimated d/c date/discharge to SNF.   Therapy Documentation Precautions:  Precautions Precautions: Fall Type of Shoulder Precautions: R shoulder weakness and subluxation most recent stroke Restrictions Weight Bearing Restrictions: No   See Function Navigator for Current Functional Status.  Therapy/Group: Individual Therapy  Waunita Schooner 06/19/2017, 12:21 PM

## 2017-06-19 NOTE — Progress Notes (Signed)
Occupational Therapy Weekly Progress Note  Patient Details  Name: [name] [name] MRN: [id number] Date of Birth: Feb 22, 1940  Beginning of progress report period: June 13, 2017 End of progress report period: June 19, 2017  Patient has met 1 of 2 short term goals.  Pt is making very slow but steady progress towards OT goals. She cont to be most limited by R hemiparesis though does demonstrate returning movements in R UE. Pt with very sedentary life stlye PTA using power w/c and dependent for transfers. As a result, pt unable to tolerate WBing through LEs due to stretching position and with poor trunk control/ stabilization. Pt's family did not attend planned OT family ed. Session, however, did attend PT session where her son demonstrated the total A squat pivot transfer method they used PTA.  Recommending pt d/c to SNF in order to cont to decrease caregiver burden, increase independence with ADLs, as well as for family to piece together recommended 24hr care for pt as family unable to provide that at this time.   Patient continues to demonstrate the following deficits: abnormal posture, hemiplegia affecting dominant side, muscle weakness (generalized) and coordination disorder and therefore will continue to benefit from skilled OT intervention to enhance overall performance with BADL and Reduce care partner burden.  Patient not progressing toward long term goals.  See goal revision..  Plan of care revisions: Goals revised following discussion with pt's family regarding her PLOF which was max-total A overall from bed level. See POC for goal details. .  OT Short Term Goals Week 1:  OT Short Term Goal 1 (Week 1): Pt will perform UB dressing with max A.  OT Short Term Goal 1 - Progress (Week 1): Met OT Short Term Goal 2 (Week 1): Pt will transfer onto toilet with one person for assistance. OT Short Term Goal 2 - Progress (Week 1): Not met Week 2:  OT Short Term Goal 1 (Week 2): STG=LTG due to  LOS  Therapy Documentation Precautions:  Precautions Precautions: Fall Type of Shoulder Precautions: R shoulder weakness and subluxation most recent stroke Restrictions Weight Bearing Restrictions: No   [name] [name] L 06/19/2017, 6:57 AM

## 2017-06-20 ENCOUNTER — Inpatient Hospital Stay (HOSPITAL_COMMUNITY): Payer: Medicare Other | Admitting: Speech Pathology

## 2017-06-20 ENCOUNTER — Inpatient Hospital Stay (HOSPITAL_COMMUNITY): Payer: Medicare Other | Admitting: Physical Therapy

## 2017-06-20 NOTE — Progress Notes (Signed)
[place] PHYSICAL MEDICINE & REHABILITATION     PROGRESS NOTE  Subjective/Complaints:   No new problems overnight.    ROS: Patient denies fever, rash, sore throat, blurred vision, nausea, vomiting, diarrhea, cough, shortness of breath or chest pain, joint or back pain, headache, or mood change.        Objective: Vital Signs: Blood pressure 114/61, pulse 86, temperature 97.7 F (36.5 C), temperature source Oral, resp. rate 16, height 5\' 3"  (1.6 m), weight 65.3 kg (143 lb 15.4 oz), SpO2 98 %. No results found. No results for input(s): WBC, HGB, HCT, PLT in the last 72 hours. Recent Labs    06/19/17 0415  CREATININE 0.78   CBG (last 3)   No results for input(s): GLUCAP in the last 72 hours.  Wt Readings from Last 3 Encounters:  06/20/17 65.3 kg (143 lb 15.4 oz)  06/09/17 77.1 kg (170 lb)  05/15/17 77.1 kg (170 lb)    Physical Exam:  BP 114/61 (BP Location: Right Arm)   Pulse 86   Temp 97.7 F (36.5 C) (Oral)   Resp 16   Ht 5\' 3"  (1.6 m)   Wt 65.3 kg (143 lb 15.4 oz)   SpO2 98%   BMI 25.50 kg/m  Constitutional: No distress . Vital signs reviewed. HEENT: EOMI, oral membranes moist Neck: supple Cardiovascular: RRR without murmur. No JVD    Respiratory: CTA Bilaterally without wheezes or rales. Normal effort    GI: BS +, non-tender, non-distended   Musculoskeletal: No edema or tenderness in extremities  Neurological: She isalertand oriented. Dysarthria.  Motor: 1/5 bilateral delt , bilateral hip flex, add, abd 2-bilateral knee ext, bilat biceps, 4- left and 0/5 RIght  finger flex ext, bilateral ankle  PF/DF  Assessment/Plan: 1. 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 Physiatrist is providing close team supervision and 24 hour management of active medical problems listed below. Physiatrist and rehab  team continue to assess barriers to discharge/monitor patient progress toward functional and medical goals.  Function:  Bathing Bathing position   Position: Bed  Bathing parts Body parts bathed by patient: Right upper leg, Left upper leg Body parts bathed by helper: Right arm, Left arm, Chest, Abdomen, Front perineal area, Buttocks, Right upper leg, Left upper leg, Right lower leg, Left lower leg, Back  Bathing assist Assist Level: 2 helpers      Upper Body Dressing/Undressing Upper body dressing   What is the patient wearing?: Pull over shirt/dress       Pull over shirt/dress - Perfomed by helper: Thread/unthread right sleeve, Thread/unthread left sleeve, Put head through opening, Pull shirt over trunk        Upper body assist Assist Level: (Total A)      Lower Body Dressing/Undressing Lower body dressing   What is the patient wearing?: Pants, Non-skid slipper socks       Pants- Performed by helper: Thread/unthread right pants leg, Thread/unthread left pants leg, Pull pants up/down   Non-skid slipper socks- Performed by helper: Don/doff right sock, Don/doff left sock                  Lower body assist Assist for lower body dressing: 2 Helpers      Toileting Toileting Toileting activity did not occur: Safety/medical concerns   Toileting steps completed by helper: Adjust clothing prior to toileting, Performs perineal hygiene, Adjust clothing after  toileting Toileting Assistive Devices: Grab bar or rail  Toileting assist Assist level: Two helpers   Transfers Chair/bed transfer   Chair/bed transfer method: Lateral scoot Chair/bed transfer assist level: 2 helpers Chair/bed transfer assistive device: Sliding board Mechanical lift: Chiropractor Ambulation activity did not occur: Safety/medical concerns         Wheelchair   Type: Motorized Max wheelchair distance: 150 ft  Assist Level: Supervision or verbal cues   Cognition Comprehension Comprehension assist level: Follows basic conversation/direction with extra time/assistive device  Expression Expression assist level: Expresses basic needs/ideas: With extra time/assistive device  Social Interaction Social Interaction assist level: Interacts appropriately 75 - 89% of the time - Needs redirection for appropriate language or to initiate interaction.  Problem Solving Problem solving assist level: Solves basic 50 - 74% of the time/requires cueing 25 - 49% of the time  Memory Memory assist level: Recognizes or recalls 75 - 89% of the time/requires cueing 10 - 24% of the time    Medical Problem List and Plan: 1.Decreased functional mobilitysecondary to left frontoparietal MCA infarction on 7/62 complicated by history of polymyositis  Continue CIR PT, OT, SLP-tent d/c 6/14 but now for SNF   =FL2 signed stable when bed availiable 2. DVT Prophylax notes reviewed, labs reviewed, is/Anticoagulation: Subcutaneous Lovenox. Monitor for any bleeding episodes 3. Pain Management:Neurontin 100 mg twice daily Tylenol as needed 4. Mood:Xanax 0.25 mg twice daily as needed 5. Neuropsych: This patientiscapable of making decisions on herown behalf. 6. Skin/Wound Care:Routine skin checks 7. Fluids/Electrolytes/Nutrition:Routine in and outs   6/3 HypoK- cont KCL 15meq daily, recheck in am  6/1 creat< 0.3 8.Polymyositis. Continue CellCept 500 mg twice daily. 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 Monitor for any signs of fluid overload Filed Weights   06/18/17 0500 06/19/17 0432 06/20/17 0613  Weight: 68.1 kg (150 lb 2.1 oz) 66.9 kg (147 lb 7.8 oz) 65.3 kg (143 lb 15.4 oz)   Daily weights   10.Hypertension. Tenormin 50 mg daily, Lasix 40 mg  daily prior to admission.  Vitals:   06/19/17 1935 06/20/17 0613  BP: (!) 108/52 114/61  Pulse: 82 86  Resp: 18 16  Temp: 97.7 F (36.5 C) 97.7 F (36.5 C)  SpO2: 100% 98%    controlled on 6/8 11.Pulmonary nodules. Incidental finding on CTA of neck. Noncontrast CT 3 to 6 months 12.Hyperlipidemia. Lipitor 13.  Acute blood loss anemia   Hemoglobin 10.3 on 6/1   Continue to monitor 14.   LOS (Days) 8 A FACE TO FACE EVALUATION WAS PERFORMED  Meredith Staggers 06/20/2017 8:59 AM

## 2017-06-20 NOTE — Progress Notes (Addendum)
Physical Therapy Session Note  Patient Details  Name: [name] [name] MRN: [id number] Date of Birth: May 26, 1940  Today's Date: 06/20/2017 PT Individual Time: 0802-0900 PT Individual Time Calculation (min): 58 min   Short Term Goals: Week 2:  PT Short Term Goal 1 (Week 2): STG = LTG due to estimated d/c date/discharge to SNF.  Skilled Therapeutic Interventions/Progress Updates:  Pt received in bed with NT providing dependent assist for feeding pt breakfast. Therapist educated pt & NT on pt's ability to feed herself per yesterday's OT note. Pt adamant she is unable to feed herself. Therapist set up tray to mid shoulder height for increased access to food tray but pt having difficulty properly positioning arms to feed herself in bed. Provided max encouragement to transfer to w/c for increased ease of meal consumption. Therapist donned pt's pants total assist from bed level; pt able to lift RLE off of bed to increase ease of task but otherwise pt required total assist for rolling L<>R and donning pants. Pt with little ability to follow instructions for LE to increase ease of rolling and unable to grasp bed rails to assist. Pt requires total assist to transfer sidelying>sitting EOB and for slide board placement. Pt transferred bed>w/c via slide board down hill with dependent assist (pt = 0% work). Pt also with discomfort when leaning forward 2/2 inability to control anterior weight shift & trunk. Pt maneuvered power w/c to dayroom and therapist provided meal tray set up with BUE supported on tray, lid on coffee to prevent spilling. Pt declined consuming any food, only finishing her drinks with compensatory strategy of leaning forward, pt also able to wipe mouth with napkin without assistance. In gym pt transferred w/c<>bed (always going to lower surface, using large step to support LE) with +2 assist with slide board. Once sitting EOM pt performed sit ups from wedge & 3 pillows without assistance with activity  focusing on core strengthening. At end of session pt returned to room and maneuvered in small room without assistance. Pt left sitting in power w/c with all needs within reach.   Therapy Documentation Precautions:  Precautions Precautions: Fall Type of Shoulder Precautions: R shoulder weakness and subluxation most recent stroke Restrictions Weight Bearing Restrictions: No    See Function Navigator for Current Functional Status.   Therapy/Group: Individual Therapy  Waunita Schooner 06/20/2017, 9:03 AM

## 2017-06-20 NOTE — Progress Notes (Signed)
Physical Therapy Session Note  Patient Details  Name: [name] [name] MRN: [id number] Date of Birth: 28-Mar-1940  Today's Date: 06/20/2017 PT Individual Time: 1415-1515 PT Individual Time Calculation (min): 60 min   Short Term Goals: Week 1:  PT Short Term Goal 1 (Week 1): Pt will perform bed mobility w/ mod assist PT Short Term Goal 1 - Progress (Week 1): Not met PT Short Term Goal 2 (Week 1): Pt will perform sit<>stand w/ max assist x2 for LE strengthening PT Short Term Goal 2 - Progress (Week 1): Not met PT Short Term Goal 3 (Week 1): Pt will maintain dynamic sitting balance w/ supervision PT Short Term Goal 3 - Progress (Week 1): Not met PT Short Term Goal 4 (Week 1): Pt will perform bed<>chair transfer w/ max assist x1 PT Short Term Goal 4 - Progress (Week 1): Not met Week 2:  PT Short Term Goal 1 (Week 2): STG = LTG due to estimated d/c date/discharge to SNF.  Skilled Therapeutic Interventions/Progress Updates:   Pt received supine in bed and agreeable to bed level PT.  PT instructed pt in bed level LE and UE therex: BLE: Hip/knee flexion/extension x 12 : hip extension with straight knee x 10 BLE, hip adductionx 10 hip abduction x 12, SAQ x 10, isometric bridge x 5, ankle DF with 10 sec hold at end range with assist from PT.   BUE:  Chest press with 1lb bar weight with assist from PT to stabilize bar, Bicep curl with 1lb bar weight, mid row with manual resistance, tripe extension with manual resistance. Pt required ace wrap on the R hand to maintain grasp on the the bar weight.   Rolling R and L x 5 each and return to supine with mod assist to the R and Max assist to the L with max multimodal cues for sequencing to improve trunk rotation with initiation through UE and contralateral hip extension to facilitate pelvic rotation.   Pt left supine in bed in with call bell in reach and all needs met.           Therapy Documentation Precautions:  Precautions Precautions:  Fall Type of Shoulder Precautions: R shoulder weakness and subluxation most recent stroke Restrictions Weight Bearing Restrictions: No    Vital Signs: Therapy Vitals Temp: 98.3 F (36.8 C) Pulse Rate: 74 Resp: 18 BP: 114/70 Patient Position (if appropriate): Lying Oxygen Therapy SpO2: 100 % O2 Device: Room Air Pain 4/10 "backside" "hurts"  See Function Navigator for Current Functional Status.   Therapy/Group: Individual Therapy  Lorie Phenix 06/20/2017, 3:13 PM

## 2017-06-20 NOTE — Progress Notes (Signed)
Speech Language Pathology Daily Session Note  Patient Details  Name: [name] [name] MRN: [id number] Date of Birth: Jun 14, 1940  Today's Date: 06/20/2017 SLP Individual Time: 1130-1155 SLP Individual Time Calculation (min): 25 min  Short Term Goals: Week 2: SLP Short Term Goal 1 (Week 2): STG=LTG due to remaining length of stay  Skilled Therapeutic Interventions: Skilled treatment session focused on cognitive goals. SLP facilitated session by providing Mod A verbal cues for problem solving in regards to basic wants/needs (repositioning in chair, fixing positioning of arm and ordering dinner meal).  Patient required encouragement for participation 2/2 wanting to go back to bed. However, patient became more engaged as session progressed. Patient left upright in wheelchair with all needs within reach and belt on. Continue with current plan of care.      Function:  Cognition Comprehension Comprehension assist level: Follows basic conversation/direction with extra time/assistive device  Expression   Expression assist level: Expresses basic needs/ideas: With extra time/assistive device  Social Interaction Social Interaction assist level: Interacts appropriately 75 - 89% of the time - Needs redirection for appropriate language or to initiate interaction.  Problem Solving Problem solving assist level: Solves basic 50 - 74% of the time/requires cueing 25 - 49% of the time  Memory Memory assist level: Recognizes or recalls 75 - 89% of the time/requires cueing 10 - 24% of the time    Pain Pain Assessment Pain Score: 0-No pain  Therapy/Group: Individual Therapy  Manning Luna 06/20/2017, 12:03 PM

## 2017-06-21 ENCOUNTER — Inpatient Hospital Stay (HOSPITAL_COMMUNITY): Payer: Medicare Other

## 2017-06-21 NOTE — Progress Notes (Signed)
Occupational Therapy Session Note  Patient Details  Name: [name] [name] MRN: [id number] Date of Birth: Jul 15, 1940  Today's Date: 06/21/2017 OT Individual Time: 0900-1000 OT Individual Time Calculation (min): 60 min    Skilled Therapeutic Interventions/Progress Updates:    1;1. Pt greeted in sidelying on bed reporting pain in R buttock, RN aware and already given medication. Pt rolls B with MOD A overall for OT to wash buttock/peri area as well as don clean brief. OT provides HOH A to wash B armpits. Pt requesting to don new night gown with Total HOH A for all steps. Pt supine>sitting EOB with total A of 1. Pt able to maintain static sitting balance ~4 min with overall min A but mod A on occasion while placing slide board. Pt slide board transfer EOB>PWC with +2 A  With step under feet and sheet under thighs to prevent sheering as night gown not long enough. Pt eats in dayroom with tray set at mid chest height. Pt cued to keep elbow supported on tray to decrease degrees of freedom and improve control over utensil. Pt compensating by bringing chest forward to tray as opposed to flexing elbow. Pt given MOD  HOH A for NMR during self feeding/gross grasp of cup for efficiency. Exited session with pt seated in [place]  In room with call light in lap and all needs met.   Therapy Documentation Precautions:  Precautions Precautions: Fall Type of Shoulder Precautions: R shoulder weakness and subluxation most recent stroke Restrictions Weight Bearing Restrictions: No General:   Vital Signs: Therapy Vitals Pulse Rate: 96 BP: 102/65 Pain: Pain Assessment Pain Score: 0-No pain See Function Navigator for Current Functional Status.   Therapy/Group: Individual Therapy  Tonny Branch 06/21/2017, 10:54 AM

## 2017-06-21 NOTE — Progress Notes (Signed)
[place] PHYSICAL MEDICINE & REHABILITATION     PROGRESS NOTE  Subjective/Complaints:  Patient reports buttock pain.  Patient points today to the area which appears to be perirectal  ROS: Patient denies fever, rash, sore throat, blurred vision, nausea, vomiting, diarrhea, cough, shortness of breath or chest pain, joint or back pain, headache, or mood change.         Objective: Vital Signs: Blood pressure 102/65, pulse 96, temperature 97.8 F (36.6 C), temperature source Oral, resp. rate 17, height 5\' 3"  (1.6 m), weight 67.1 kg (147 lb 14.9 oz), SpO2 93 %. No results found. No results for input(s): WBC, HGB, HCT, PLT in the last 72 hours. Recent Labs    06/19/17 0415  CREATININE 0.78   CBG (last 3)   No results for input(s): GLUCAP in the last 72 hours.  Wt Readings from Last 3 Encounters:  06/21/17 67.1 kg (147 lb 14.9 oz)  06/09/17 77.1 kg (170 lb)  05/15/17 77.1 kg (170 lb)    Physical Exam:  BP 102/65   Pulse 96   Temp 97.8 F (36.6 C) (Oral)   Resp 17   Ht 5\' 3"  (1.6 m)   Wt 67.1 kg (147 lb 14.9 oz)   SpO2 93%   BMI 26.20 kg/m  Constitutional: No distress . Vital signs reviewed. HEENT: EOMI, oral membranes moist Neck: supple Cardiovascular: RRR without murmur. No JVD    Respiratory: CTA Bilaterally without wheezes or rales. Normal effort    GI: BS +, non-tender, non-distended  Musculoskeletal: No edema or tenderness in extremities  Neurological: She isalertand oriented. Dysarthria.  Motor: 1/5 bilateral delt , bilateral hip flex, add, abd 2-bilateral knee ext, bilat biceps, 4- left and 0/5 RIght  finger flex ext, bilateral ankle  PF/DF Skin: No areas of breakdown in the gluteal or perirectal areas.  I see no hemorrhoids either.  Area is not tender to touch  Assessment/Plan: 1. Functional deficits secondary to Left frontoparietal MCA infarction with history of polymyositis which require 3+ hours per day of interdisciplinary therapy in a comprehensive  inpatient rehab setting. Physiatrist is providing close team supervision and 24 hour management of active medical problems listed below. Physiatrist and rehab team continue to assess barriers to discharge/monitor patient progress toward functional and medical goals.  Function:  Bathing Bathing position   Position: Bed  Bathing parts Body parts bathed by patient: Right upper leg, Left upper leg Body parts bathed by helper: Right arm, Left arm, Chest, Abdomen, Front perineal area, Buttocks, Right upper leg, Left upper leg, Right lower leg, Left lower leg, Back  Bathing assist Assist Level: 2 helpers      Upper Body Dressing/Undressing Upper body dressing   What is the patient wearing?: Pull over shirt/dress       Pull over shirt/dress - Perfomed by helper: Thread/unthread right sleeve, Thread/unthread left sleeve, Put head through opening, Pull shirt over trunk        Upper body assist Assist Level: (Total A)      Lower Body Dressing/Undressing Lower body dressing   What is the patient wearing?: Pants, Non-skid slipper socks       Pants- Performed by helper: Thread/unthread right pants leg, Thread/unthread left pants leg, Pull pants up/down   Non-skid slipper socks- Performed by helper: Don/doff right sock, Don/doff left sock                  Lower body assist Assist for lower body dressing: 2 Helpers  Toileting Toileting Toileting activity did not occur: Safety/medical concerns   Toileting steps completed by helper: Adjust clothing prior to toileting, Performs perineal hygiene, Adjust clothing after toileting Toileting Assistive Devices: Grab bar or rail  Toileting assist Assist level: Two helpers   Transfers Chair/bed transfer   Chair/bed transfer method: Lateral scoot Chair/bed transfer assist level: 2 helpers Chair/bed transfer assistive device: Sliding board Mechanical lift: Maximove   Locomotion Ambulation Ambulation activity did not occur:  Safety/medical concerns         Wheelchair   Type: Motorized Max wheelchair distance: 150 ft  Assist Level: Supervision or verbal cues  Cognition Comprehension Comprehension assist level: Follows basic conversation/direction with extra time/assistive device  Expression Expression assist level: Expresses basic needs/ideas: With extra time/assistive device  Social Interaction Social Interaction assist level: Interacts appropriately 75 - 89% of the time - Needs redirection for appropriate language or to initiate interaction.  Problem Solving Problem solving assist level: Solves basic 50 - 74% of the time/requires cueing 25 - 49% of the time  Memory Memory assist level: Recognizes or recalls 75 - 89% of the time/requires cueing 10 - 24% of the time    Medical Problem List and Plan: 1.Decreased functional mobilitysecondary to left frontoparietal MCA infarction on 7/74 complicated by history of polymyositis  Continue CIR PT, OT, SLP-tent d/c 6/14 but now for SNF   =FL2 signed stable when bed availiable 2. DVT Prophylax notes reviewed, labs reviewed, is/Anticoagulation: Subcutaneous Lovenox. Monitor for any bleeding episodes 3. Pain Management:Neurontin 100 mg twice daily Tylenol as needed 4. Mood:Xanax 0.25 mg twice daily as needed 5. Neuropsych: This patientiscapable of making decisions on herown behalf. 6. Skin/Wound Care:Routine skin checks  -Aloe and local care to skin.  I see no signs of breakdown in the buttocks.  -Checked with nurse also and she reports no problems there either other than patient's complaints. May be muscular related to #8 below 7. Fluids/Electrolytes/Nutrition:Routine in and outs   6/3 HypoK- cont KCL 35meq daily, recheck in am  6/1 creat< 0.3 8.Polymyositis. Continue CellCept 500 mg twice daily. 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 Monitor for any signs of fluid overload Filed Weights   06/19/17 0432 06/20/17 0613 06/21/17 0347  Weight: 66.9 kg (147 lb 7.8 oz) 65.3 kg (143 lb 15.4 oz) 67.1 kg (147 lb 14.9 oz)   Daily weights   10.Hypertension. Tenormin 50 mg daily, Lasix 40 mg daily prior to admission.  Vitals:   06/21/17 0347 06/21/17 0823  BP: (!) 109/52 102/65  Pulse: (!) 113 96  Resp: 17   Temp: 97.8 F (36.6 C)   SpO2: 93%     controlled on 6/9 11.Pulmonary nodules. Incidental finding on CTA of neck. Noncontrast CT 3 to 6 months 12.Hyperlipidemia. Lipitor 13.  Acute blood loss anemia   Hemoglobin 10.3 on 6/1   Continue to monitor 14.   LOS (Days) 9 A FACE TO FACE EVALUATION WAS PERFORMED  Meredith Staggers 06/21/2017 8:52 AM

## 2017-06-22 ENCOUNTER — Inpatient Hospital Stay (HOSPITAL_COMMUNITY): Payer: Medicare Other | Admitting: Physical Therapy

## 2017-06-22 ENCOUNTER — Inpatient Hospital Stay (HOSPITAL_COMMUNITY): Payer: Medicare Other | Admitting: Speech Pathology

## 2017-06-22 ENCOUNTER — Inpatient Hospital Stay (HOSPITAL_COMMUNITY): Payer: Medicare Other | Admitting: Occupational Therapy

## 2017-06-22 LAB — BASIC METABOLIC PANEL
Anion gap: 12 (ref 5–15)
BUN: 23 mg/dL — AB (ref 6–20)
CO2: 26 mmol/L (ref 22–32)
CREATININE: 0.8 mg/dL (ref 0.44–1.00)
Calcium: 9.4 mg/dL (ref 8.9–10.3)
Chloride: 104 mmol/L (ref 101–111)
GFR calc Af Amer: >60 mL/min (ref >60)
Glucose, Bld: 145 mg/dL — ABNORMAL HIGH (ref 65–99)
Potassium: 3.8 mmol/L (ref 3.5–5.1)
SODIUM: 142 mmol/L (ref 135–145)

## 2017-06-22 MED ORDER — SENNOSIDES-DOCUSATE SODIUM 8.6-50 MG PO TABS: 2.0000 | ORAL_TABLET | Freq: Every evening | ORAL | Status: DC | PRN

## 2017-06-22 NOTE — Plan of Care (Signed)
  Problem: RH SKIN INTEGRITY Goal: RH STG SKIN FREE OF INFECTION/BREAKDOWN Description Patient will be free of any skin breakdown entire stay on rehab with min assist  Outcome: Progressing Goal: RH STG MAINTAIN SKIN INTEGRITY WITH ASSISTANCE Description STG Maintain Skin Integrity With min  Assistance.  Outcome: Progressing   Problem: RH SAFETY Goal: RH STG ADHERE TO SAFETY PRECAUTIONS W/ASSISTANCE/DEVICE Description STG Adhere to Safety Precautions Mod With Assistance/Device.  Outcome: Progressing Goal: RH STG DECREASED RISK OF FALL WITH ASSISTANCE Description STG Decreased Risk of Fall With Mod Assistance.  Outcome: Progressing   Problem: RH PAIN MANAGEMENT Goal: RH STG PAIN MANAGED AT OR BELOW PT'S PAIN GOAL Description Pain free or less than 3  Outcome: Progressing   Problem: RH KNOWLEDGE DEFICIT Goal: RH STG INCREASE KNOWLEDGE OF HYPERTENSION Description Min assist  Outcome: Progressing

## 2017-06-22 NOTE — Progress Notes (Signed)
Physical Therapy Session Note  Patient Details  Name: [name] [name] MRN: [id number] Date of Birth: October 11, 1940  Today's Date: 06/22/2017 PT Individual Time: 1036-1202 PT Individual Time Calculation (min): 86 min   Short Term Goals: Week 2:  PT Short Term Goal 1 (Week 2): STG = LTG due to estimated d/c date/discharge to SNF.  Skilled Therapeutic Interventions/Progress Updates:  Pt received in power w/c, reporting she's "sleepy" but agreeable to tx. Pt maneuvered power w/c throughout unit with LUE and distant supervision. In gym pt completes downhill/equal slide board transfer w/c<>mat table with +2 assist. Pt with very poor trunk control with significant anterior weight shifting and little to no ability to assist with pushing/pulling with LUE. Once on mat table pt performed lateral lean/elbow pushups to return to midline for BUE strengthening, activity tolerance, and for task to carryover into bed mobility. Pt required 1 rest break & educated on pursed lip breathing 2/2 dizziness that dissipated with rest - pt educated to decrease speed of repetitions and no further c/o dizziness reported. Pt then performed sit-ups from wedge & 2 pillows with task focusing on core/trunk strengthening. Pt then transferred on/off tilt table with maximove +2 assist. Pt able to tolerate table up to 40 degrees for 5-6 minutes before returning supine 2/2 "pain" with stretching. Encouraged pt attempt task again but after a few seconds at 35 degrees pt reported "I think we need to stop" and pt returned to w/c after rest break. Pt placed on tilt table to focus on prolonged stretch to B ankles, knees, hips and pt unable to achieve neutral alignment while on table. Pt educated on need to stretch joints to prevent permanent ROM loss. Pt with impaired memory as she was unable to recall using tilt table in previous session. At end of session pt left sitting in power w/c with all needs within reach.   While on mat table pt performed  supine<>sit and rolling L<>R with max/+2 assist 2/2 weakness to allow therapist to pull pants over hips.  Therapy Documentation Precautions:  Precautions Precautions: Fall Type of Shoulder Precautions: R shoulder weakness and subluxation most recent stroke Restrictions Weight Bearing Restrictions: No    See Function Navigator for Current Functional Status.   Therapy/Group: Individual Therapy  Waunita Schooner 06/22/2017, 12:04 PM

## 2017-06-22 NOTE — Progress Notes (Signed)
Speech Language Pathology Discharge Summary  Patient Details  Name: [name] [name] MRN: [id number] Date of Birth: [birth year]  Today's Date: 06/22/2017 SLP Individual Time: 0730-0830 SLP Individual Time Calculation (min): 60 min   Skilled Therapeutic Interventions:  Skilled treatment session focused on cognition goals. SLP facilitated session by providing supervision for cognitive problem solving and information about discharge planning. Pt's ability to perform ADLs is limited by physical deficits but cognitive abilities are deemed to be within baseline functional limits at this time. No further skilled ST is indicated at this time.     Patient has met 5 of 5 long term goals.  Patient to discharge at [facility] level.    Clinical Impression/Discharge Summary:   Pt has made progress during this reporting period and as a result has met 5 of 5 LTGs and requires Min A to Supervision largely d/t physical deficits. Pt plans to discharge to SNF to help with physical needs as her family is not able to provide support.   Care Partner:  Caregiver Able to Provide Assistance: No     Recommendation:  Skilled Nursing facility  Rationale for SLP Follow Up: Reduce caregiver burden   Equipment:     Reasons for discharge: Treatment goals met   Patient/Family Agrees with Progress Made and Goals Achieved: Yes   Function:    Cognition Comprehension Comprehension assist level: Follows basic conversation/direction with extra time/assistive device  Expression   Expression assist level: Expresses basic needs/ideas: With extra time/assistive device  Social Interaction Social Interaction assist level: Interacts appropriately 75 - 89% of the time - Needs redirection for appropriate language or to initiate interaction.  Problem Solving Problem solving assist level: Solves basic 75 - 89% of the time/requires cueing 10 - 24% of the time  Memory Memory assist level: Recognizes or recalls 75 - 89% of the  time/requires cueing 10 - 24% of the time   Wes Lezotte 06/22/2017, 12:17 PM

## 2017-06-22 NOTE — Progress Notes (Signed)
Occupational Therapy Session Note  Patient Details  Name: [name] [name] MRN: [id number] Date of Birth: 07/15/1940  Today's Date: 06/22/2017 OT Individual Time: 6962-9528 OT Individual Time Calculation (min): 60 min    Short Term Goals: Week 2:  OT Short Term Goal 1 (Week 2): STG=LTG due to LOS  Skilled Therapeutic Interventions/Progress Updates:    Pt seen for OT session focusing on functional sitting balance, transfer and ADL re-training. Pt in supine upon arrival with RN present admistering morning meds. Pt transferred t EOB with total A +1 using hospital bed functions. She sat EOB to take medications. LB dressing also completed from EOB. Pt able to assist with pulling pants up over knees once pants threaded. Total A using lateral leans to pull pant sup with pt requiring max A to return to midline following lateral lean. She completed total A +2 sliding board transfer to power w/c. She completed grooming tasks from w/c level at sink. Hand over hand assist to use R UE at stabilizer level. Pt demonstrates poor problem solving/ awareness as pt attempting to use R UE at level it is not capable of at this time due to hemiparesis and unable to problem solve alternative method. Pt able to anterior weight shift to spit into sink and then recline back, however fatigued quickly throughout task.  Pt left seated in power w/c at end of session, all needs in reach, seat belt on.   Therapy Documentation Precautions:  Precautions Precautions: Fall Type of Shoulder Precautions: R shoulder weakness and subluxation most recent stroke Restrictions Weight Bearing Restrictions: No Pain:   No/denies pain  See Function Navigator for Current Functional Status.   Therapy/Group: Individual Therapy  [name] L 06/22/2017, 7:07 AM

## 2017-06-22 NOTE — Progress Notes (Signed)
[place] PHYSICAL MEDICINE & REHABILITATION     PROGRESS NOTE  Subjective/Complaints:  Pt asking about going home , we discussed deficits from CVA and next step of SNF.  SHe recalled speaking to MSW about this  ROS: Patient denies fever, rash, sore throat, blurred vision, nausea, vomiting, diarrhea, cough, shortness of breath or chest pain, joint or back pain, headache, or mood change.         Objective: Vital Signs: Blood pressure 131/65, pulse 89, temperature 97.8 F (36.6 C), temperature source Oral, resp. rate 12, height 5\' 3"  (1.6 m), weight 65.1 kg (143 lb 8.3 oz), SpO2 100 %. No results found. No results for input(s): WBC, HGB, HCT, PLT in the last 72 hours. No results for input(s): NA, K, CL, GLUCOSE, BUN, CREATININE, CALCIUM in the last 72 hours.  Invalid input(s): CO CBG (last 3)   No results for input(s): GLUCAP in the last 72 hours.  Wt Readings from Last 3 Encounters:  06/22/17 65.1 kg (143 lb 8.3 oz)  06/09/17 77.1 kg (170 lb)  05/15/17 77.1 kg (170 lb)    Physical Exam:  BP 131/65 (BP Location: Left Arm)   Pulse 89   Temp 97.8 F (36.6 C) (Oral)   Resp 12   Ht 5\' 3"  (1.6 m)   Wt 65.1 kg (143 lb 8.3 oz)   SpO2 100%   BMI 25.42 kg/m  Constitutional: No distress . Vital signs reviewed. HEENT: EOMI, oral membranes moist Neck: supple Cardiovascular: RRR without murmur. No JVD    Respiratory: CTA Bilaterally without wheezes or rales. Normal effort    GI: BS +, non-tender, non-distended  Musculoskeletal: No edema or tenderness in extremities  Neurological: She isalertand oriented. Dysarthria.  Motor: 1/5 bilateral delt , bilateral hip flex, add, abd 2-bilateral knee ext, bilat biceps, 4- left and 0/5 RIght  finger flex ext, bilateral ankle  PF/DF Skin: No areas of breakdown in the gluteal or perirectal areas.  I see no hemorrhoids either.  Area is not tender to touch  Assessment/Plan: 1. Functional deficits secondary to Left frontoparietal MCA  infarction with history of polymyositis which require 3+ hours per day of interdisciplinary therapy in a comprehensive inpatient rehab setting. Physiatrist is providing close team supervision and 24 hour management of active medical problems listed below. Physiatrist and rehab team continue to assess barriers to discharge/monitor patient progress toward functional and medical goals.  Function:  Bathing Bathing position   Position: Bed  Bathing parts Body parts bathed by patient: Right upper leg, Left upper leg Body parts bathed by helper: Right arm, Left arm, Chest, Abdomen, Front perineal area, Buttocks, Right upper leg, Left upper leg, Right lower leg, Left lower leg, Back  Bathing assist Assist Level: 2 helpers      Upper Body Dressing/Undressing Upper body dressing   What is the patient wearing?: Pull over shirt/dress       Pull over shirt/dress - Perfomed by helper: Thread/unthread right sleeve, Thread/unthread left sleeve, Put head through opening, Pull shirt over trunk        Upper body assist Assist Level: (Total A)      Lower Body Dressing/Undressing Lower body dressing   What is the patient wearing?: Pants, Non-skid slipper socks       Pants- Performed by helper: Thread/unthread right pants leg, Thread/unthread left pants leg, Pull pants up/down   Non-skid slipper socks- Performed by helper: Don/doff right sock, Don/doff left sock  Lower body assist Assist for lower body dressing: 2 Helpers      Toileting Toileting Toileting activity did not occur: Safety/medical concerns   Toileting steps completed by helper: Adjust clothing prior to toileting, Performs perineal hygiene, Adjust clothing after toileting Toileting Assistive Devices: Grab bar or rail  Toileting assist Assist level: Two helpers   Transfers Chair/bed transfer   Chair/bed transfer method: Lateral scoot Chair/bed transfer assist level: 2 helpers Chair/bed transfer  assistive device: Sliding board Mechanical lift: Maximove   Locomotion Ambulation Ambulation activity did not occur: Safety/medical concerns         Wheelchair   Type: Motorized Max wheelchair distance: 150 ft  Assist Level: Supervision or verbal cues  Cognition Comprehension Comprehension assist level: Follows basic conversation/direction with extra time/assistive device  Expression Expression assist level: Expresses basic needs/ideas: With extra time/assistive device  Social Interaction Social Interaction assist level: Interacts appropriately 75 - 89% of the time - Needs redirection for appropriate language or to initiate interaction.  Problem Solving Problem solving assist level: Solves basic 50 - 74% of the time/requires cueing 25 - 49% of the time  Memory Memory assist level: Recognizes or recalls 75 - 89% of the time/requires cueing 10 - 24% of the time    Medical Problem List and Plan: 1.Decreased functional mobilitysecondary to left frontoparietal MCA infarction on 7/84 complicated by history of polymyositis  Continue CIR PT, OT, SLP-tent d/c 6/14 but now for SNF   =FL2 signed stable when bed availiable 2. DVT Prophylax notes reviewed, labs reviewed, is/Anticoagulation: Subcutaneous Lovenox. Monitor for any bleeding episodes 3. Pain Management:Neurontin 100 mg twice daily Tylenol as needed 4. Mood:Xanax 0.25 mg twice daily as needed 5. Neuropsych: This patientiscapable of making decisions on herown behalf. 6. Skin/Wound Care:Routine skin checks  -Aloe and local care to skin.  I see no signs of breakdown in the buttocks.  -Checked with nurse also and she reports no problems there either other than patient's complaints. May be muscular related to #8 below 7. Fluids/Electrolytes/Nutrition:Routine in and outs   6/3 HypoK- cont KCL 79meq daily, recheck today  6/1 creat< 0.3 8.Polymyositis. Continue CellCept 500 mg twice daily. 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 Monitor for any signs of fluid overload Filed Weights   06/20/17 0613 06/21/17 0347 06/22/17 0506  Weight: 65.3 kg (143 lb 15.4 oz) 67.1 kg (147 lb 14.9 oz) 65.1 kg (143 lb 8.3 oz)   Daily weights  fluctuate 10.Hypertension. Tenormin 50 mg daily, Lasix 40 mg daily prior to admission.  Vitals:   06/21/17 1956 06/22/17 0506  BP: 122/61 131/65  Pulse: 89 89  Resp: 16 12  Temp: 98.5 F (36.9 C) 97.8 F (36.6 C)  SpO2: 100% 100%    controlled on 6/10 11.Pulmonary nodules. Incidental finding on CTA of neck. Noncontrast CT 3 to 6 months 12.Hyperlipidemia. Lipitor 13.   Anemia- normocytic normochromic, check stool quaic   Hemoglobin 10.1 on 6/3   Continue to monitor 14.   LOS (Days) 10 A FACE TO FACE EVALUATION WAS PERFORMED  Charlett Blake 06/22/2017 8:40 AM

## 2017-06-23 ENCOUNTER — Inpatient Hospital Stay (HOSPITAL_COMMUNITY): Payer: Medicare Other | Admitting: Occupational Therapy

## 2017-06-23 MED ORDER — WHITE PETROLATUM EX OINT
TOPICAL_OINTMENT | CUTANEOUS | Status: AC
  Administered 2017-06-23: 15:00:00
  Filled 2017-06-23: qty 28.35

## 2017-06-23 NOTE — Progress Notes (Signed)
Occupational Therapy Session Note  Patient Details  Name: [name] [name] MRN: [id number] Date of Birth: [birth year]  Today's Date: 06/23/2017 OT Individual Time: 1300-1330 OT Individual Time Calculation (min): 30 min    Short Term Goals: Week 1:  OT Short Term Goal 1 (Week 1): Pt will perform UB dressing with max A.  OT Short Term Goal 1 - Progress (Week 1): Met OT Short Term Goal 2 (Week 1): Pt will transfer onto toilet with one person for assistance. OT Short Term Goal 2 - Progress (Week 1): Not met Week 2:  OT Short Term Goal 1 (Week 2): STG=LTG due to LOS  Skilled Therapeutic Interventions/Progress Updates:    1:1 Focus on functional use of right hand including pincher grasp with light resistance close pins and manipulation of softest tan theraputty. Pt with improved ability to grasp radially and perform functional reach with arm supported against gravity.  Pt with significant improvement with driving power chair in busy environment.   Therapy Documentation Precautions:  Precautions Precautions: Fall Type of Shoulder Precautions: R shoulder weakness and subluxation most recent stroke Restrictions Weight Bearing Restrictions: No General:   Vital Signs:   Pain: Reported discomfort in bottom- reeducated on pressure relief with tilt feature of power chair. After pressure relieving position pt's discomfort relieved. See Function Navigator for Current Functional Status.   Therapy/Group: Individual Therapy  [name] [name] [facility] 06/23/2017, 2:27 PM

## 2017-06-23 NOTE — Progress Notes (Signed)
Physical Therapy Session Note  Patient Details  Name: [name] [name] MRN: [id number] Date of Birth: [birth year]  Today's Date: 06/23/2017 PT Individual Time: 0800-0900 PT Individual Time Calculation (min): 60 min   Short Term Goals: Week 2:  PT Short Term Goal 1 (Week 2): STG = LTG due to estimated d/c date/discharge to SNF.  Skilled Therapeutic Interventions/Progress Updates:    Pt supine in bed upon PT arrival, agreeable to therapy tx and denies pain at rest. Pt reports having to use the bathroom. Pt transferred from supine>sitting EOB with max assist. Pt performed slideboard transfer from bed>bedside commode with total assist. Once sitting on commode pt requiring total assist +2 for lateral leans in each direction to perform clothing management. Total assist +2 for lateral leans to don pants. Pt performed slideboard transfer back to bed total assist. Pt requiring min guard for seated balance while EOB. Pt performed slideboard transfer from bed>power w/c with total assist. Pt performed power w/c mobility to the dayroom with supervision x 150 ft. Pt performed slideboard transfer from w/c<>tilt table with +2 assist. Pt transferred to supine total assist. Using the tilt table, pt tilted to 30 degrees for increased LE weightbearing, manual facilitation for LE placement to increase hamstring/heel cord stretching, pt able to tolerate position x 3 minutes. While tilted pt performed R elbow flexion/extension against gravity x 10 with stabilization at the elbow joint. Pt flat on tilt table performed x 5 R hip flexion AROM and x 10 bridges with manual facilitation. Pt performed slideboard transfer back to w/c and performed power w/c mobility back to room, left seated with needs in reach and seat belt in place.   Therapy Documentation Precautions:  Precautions Precautions: Fall Type of Shoulder Precautions: R shoulder weakness and subluxation most recent stroke Restrictions Weight Bearing Restrictions:  No See Function Navigator for Current Functional Status.   Therapy/Group: Individual Therapy  Netta Corrigan, PT, DPT 06/23/2017, 7:47 AM

## 2017-06-23 NOTE — Progress Notes (Signed)
Occupational Therapy Session Note  Patient Details  Name: [name] [name] MRN: [id number] Date of Birth: September 10, 1940  Today's Date: 06/23/2017 OT Individual Time: 1100-1230 OT Individual Time Calculation (min): 90 min    Short Term Goals: Week 2:  OT Short Term Goal 1 (Week 2): STG=LTG due to LOS  Skilled Therapeutic Interventions/Progress Updates:    Pt seen for OT session focusing on neuro re-ed, LE stretching, and self-feeding as part of ADL re-training. Session co-tx with OT clinical specialist.  Pt sitting up in power w/c upon arrival, agreeable to tx session. She maneuvered power w/c throughout unit at supervision level, able to manipulate all w/c controls as needed appropriately. Throughout session completed sliding board transfer with total A +2. Placed in supine on mat, LEs placed in semi extended position to pt tolerance for hamstring stretch and positioning for internal rotation to midline of L LE.  While in supine, completed gravity eliminated and supported movements with R UE for neuro re-ed. Pt demonstrates poor proprioceptive abilities in R UE, however, demonstrates full tricep extension in gravity supported position and controlled bicep for accurate placement with movements with visual feedback of placement. Pt also with minimal finger flexion/extension and some wrist extension, however, unable to isolate wrist movements. Returned to w/c and steered w/c to therapy day room. Pt set-up with lunch and able to self feed with L UE at raised table, wrist supported on table. Intermittent assist to scoop food onto utensil and manage cup for energy conservation and reduce frustration.  Pt returned to room at end of session, left seated in power chair with all needs in reach, seat belt donned.   Therapy Documentation Precautions:  Precautions Precautions: Fall Type of Shoulder Precautions: R shoulder weakness and subluxation most recent stroke Restrictions Weight Bearing Restrictions:  No Pain:    No/denies pain  See Function Navigator for Current Functional Status.   Therapy/Group: Individual Therapy  Yulonda Wheeling L 06/23/2017, 7:01 AM

## 2017-06-23 NOTE — Progress Notes (Signed)
[facility] PHYSICAL MEDICINE & REHABILITATION     PROGRESS NOTE  Subjective/Complaints:    ROS: Patient denies fever, rash, sore throat, blurred vision, nausea, vomiting, diarrhea, cough, shortness of breath or chest pain, joint or back pain, headache, or mood change.         Objective: Vital Signs: Blood pressure 127/70, pulse 99, temperature 98.3 F (36.8 C), temperature source Oral, resp. rate 18, height 5\' 3"  (1.6 m), weight 66.2 kg (145 lb 15.1 oz), SpO2 98 %. No results found. No results for input(s): WBC, HGB, HCT, PLT in the last 72 hours. Recent Labs    06/22/17 0914  NA 142  K 3.8  CL 104  GLUCOSE 145*  BUN 23*  CREATININE 0.80  CALCIUM 9.4   CBG (last 3)   No results for input(s): GLUCAP in the last 72 hours.  Wt Readings from Last 3 Encounters:  06/23/17 66.2 kg (145 lb 15.1 oz)  06/09/17 77.1 kg (170 lb)  05/15/17 77.1 kg (170 lb)    Physical Exam:  BP 127/70 (BP Location: Left Arm)   Pulse 99   Temp 98.3 F (36.8 C) (Oral)   Resp 18   Ht 5\' 3"  (1.6 m)   Wt 66.2 kg (145 lb 15.1 oz)   SpO2 98%   BMI 25.85 kg/m  Constitutional: No distress . Vital signs reviewed. HEENT: EOMI, oral membranes moist Neck: supple Cardiovascular: RRR without murmur. No JVD    Respiratory: CTA Bilaterally without wheezes or rales. Normal effort    GI: BS +, non-tender, non-distended  Musculoskeletal: No edema or tenderness in extremities  Neurological: She isalertand oriented. Dysarthria.  Motor: 1/5 bilateral delt , bilateral hip flex, add, abd 2-bilateral knee ext, 0/5 bilateral hip add,3- bilat biceps, 4- left and 0/5 RIght  finger flex ext, 3- bilateral ankle  PF/DF Scapular winging on left side Assessment/Plan: 1. 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 Physiatrist is providing close team  supervision and 24 hour management of active medical problems listed below. Physiatrist and rehab team continue to assess barriers to discharge/monitor patient progress toward functional and medical goals.  Function:  Bathing Bathing position   Position: Bed  Bathing parts Body parts bathed by patient: Right upper leg, Left upper leg Body parts bathed by helper: Right arm, Left arm, Chest, Abdomen, Front perineal area, Buttocks, Right upper leg, Left upper leg, Right lower leg, Left lower leg, Back  Bathing assist Assist Level: 2 helpers      Upper Body Dressing/Undressing Upper body dressing   What is the patient wearing?: Pull over shirt/dress       Pull over shirt/dress - Perfomed by helper: Thread/unthread right sleeve, Thread/unthread left sleeve, Put head through opening, Pull shirt over trunk        Upper body assist Assist Level: (Total A)      Lower Body Dressing/Undressing Lower body dressing   What is the patient wearing?: Pants, Non-skid slipper socks       Pants- Performed by helper: Pull pants up/down   Non-skid slipper socks- Performed by helper: Don/doff right sock, Don/doff left sock                  Lower body assist Assist for lower body dressing: Touching or steadying assistance (Pt > 75%)      Toileting Toileting Toileting activity did not occur: Safety/medical concerns  Toileting steps completed by helper: Adjust clothing prior to toileting, Performs perineal hygiene, Adjust clothing after toileting Toileting Assistive Devices: Grab bar or rail  Toileting assist Assist level: Two helpers   Transfers Chair/bed transfer   Chair/bed transfer method: Lateral scoot Chair/bed transfer assist level: 2 helpers Chair/bed transfer assistive device: Sliding board Mechanical lift: Maximove   Locomotion Ambulation Ambulation activity did not occur: Safety/medical concerns         Wheelchair   Type: Motorized Max wheelchair distance: 150 ft   Assist Level: Supervision or verbal cues  Cognition Comprehension Comprehension assist level: Follows basic conversation/direction with extra time/assistive device  Expression Expression assist level: Expresses basic needs/ideas: With extra time/assistive device  Social Interaction Social Interaction assist level: Interacts appropriately 75 - 89% of the time - Needs redirection for appropriate language or to initiate interaction.  Problem Solving Problem solving assist level: Solves basic 75 - 89% of the time/requires cueing 10 - 24% of the time  Memory Memory assist level: Recognizes or recalls 75 - 89% of the time/requires cueing 10 - 24% of the time    Medical Problem List and Plan: 1.Decreased functional mobilitysecondary to left frontoparietal MCA infarction on 1/77 complicated by history of polymyositis  Continue CIR PT, OT, SLP-tent d/c 6/14 but now for SNF team conf in am  FL2 signed stable when bed availiable 2. DVT Prophylax notes reviewed, labs reviewed, is/Anticoagulation: Subcutaneous Lovenox. Monitor for any bleeding episodes 3. Pain Management:Neurontin 100 mg twice daily Tylenol as needed 4. Mood:Xanax 0.25 mg twice daily as needed 5. Neuropsych: This patientiscapable of making decisions on herown behalf. 6. Skin/Wound Care:Routine skin checks  -Aloe and local care to skin.  I see no signs of breakdown in the buttocks.  -Checked with nurse also and she reports no problems there either other than patient's complaints. May be muscular related to #8 below 7. Fluids/Electrolytes/Nutrition:Routine in and outs   6/3 HypoK- cont KCL 16meq daily, recheck 6/10- 3.8  6/1 creat  0.80 8.Polymyositis. Continue CellCept 500 mg twice daily. 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 Monitor for any signs of fluid overload Filed Weights   06/21/17 0347 06/22/17 0506 06/23/17 0542  Weight: 67.1 kg (147 lb 14.9 oz) 65.1 kg (143 lb 8.3 oz) 66.2 kg (145 lb 15.1 oz)   Daily weights  fluctuate 10.Hypertension. Tenormin 50 mg daily, Lasix 40 mg daily prior to admission.  Vitals:   06/22/17 2222 06/23/17 0542  BP: (!) 114/52 127/70  Pulse: 99 99  Resp: 16 18  Temp: 98.5 F (36.9 C) 98.3 F (36.8 C)  SpO2: 96% 98%    controlled on 6/11 11.Pulmonary nodules. Incidental finding on CTA of neck. Noncontrast CT 3 to 6 months 12.Hyperlipidemia. Lipitor 13.   Anemia- normocytic normochromic,  stool guaic pending    Hemoglobin 10.1 on 6/3   Continue to monitor  LOS (Days) 11 A FACE TO FACE EVALUATION WAS PERFORMED  Charlett Blake 06/23/2017 8:15 AM

## 2017-06-24 ENCOUNTER — Inpatient Hospital Stay (HOSPITAL_COMMUNITY): Payer: Medicare Other | Admitting: Physical Therapy

## 2017-06-24 ENCOUNTER — Inpatient Hospital Stay (HOSPITAL_COMMUNITY): Payer: Medicare Other | Admitting: Occupational Therapy

## 2017-06-24 MED ORDER — SENNOSIDES-DOCUSATE SODIUM 8.6-50 MG PO TABS: 2.0000 | ORAL_TABLET | Freq: Every evening | ORAL | Status: DC | PRN

## 2017-06-24 MED ORDER — POTASSIUM CHLORIDE CRYS ER 20 MEQ PO TBCR: 40.0000 meq | EXTENDED_RELEASE_TABLET | Freq: Every day | ORAL | Status: DC

## 2017-06-24 MED ORDER — FUROSEMIDE 40 MG PO TABS: 40.0000 mg | ORAL_TABLET | Freq: Every day | ORAL | Status: DC

## 2017-06-24 MED ORDER — ALPRAZOLAM 0.5 MG PO TABS: 0.5000 mg | ORAL_TABLET | Freq: Two times a day (BID) | ORAL | 0 refills | Status: DC | PRN

## 2017-06-24 MED ORDER — MYCOPHENOLATE MOFETIL 250 MG PO CAPS: 500.0000 mg | ORAL_CAPSULE | Freq: Two times a day (BID) | ORAL | Status: DC

## 2017-06-24 MED ORDER — PANTOPRAZOLE SODIUM 40 MG PO TBEC: 40.0000 mg | DELAYED_RELEASE_TABLET | Freq: Every day | ORAL | Status: DC

## 2017-06-24 NOTE — Progress Notes (Addendum)
Physical Therapy Discharge Summary  Patient Details  Name: MAJESTI GAMBRELL MRN: [id number] Date of Birth: 11-29-1940  Today's Date: 06/24/2017   Patient has met 3 of 5 long term goals due to improved activity tolerance, improved postural control, increased strength and ability to compensate for deficits.  Patient to discharge at a power wheelchair level, total assist +1-2 for transfers. This therapist recommends pt d/c with 24 hr assist 2/2 significant strength & mobility deficits.  Reasons goals not met: significant strength deficits   Recommendation:  Patient will benefit from ongoing skilled PT services in skilled nursing facility setting to continue to advance safe functional mobility, address ongoing impairments in decreased strength & endurance, and minimize fall risk.  Equipment: TBD in next venue of care, but recommending hospital bed at home  Reasons for discharge: discharge from hospital  Patient/family agrees with progress made and goals achieved: Yes   Addendum: Pt has a personal power w/c she received <2 years ago from [employer]. This therapist contacted them regarding making adjustments to her personal chair (I.e. Moving control to L side 2/2 new diagnosis of R hemiplegia, and possible need for new cushion) but they state these changes must be made just prior to pt's d/c from SNF.  PT Discharge Precautions/Restrictions Precautions Precautions: Fall Type of Shoulder Precautions: R shoulder weakness and subluxation most recent stroke Restrictions Weight Bearing Restrictions: No  Cognition Overall Cognitive Status: Within Functional Limits for tasks assessed Arousal/Alertness: Awake/alert Awareness: Impaired Awareness Impairment: Anticipatory impairment Executive Function: Reasoning;Decision Making Reasoning: Impaired Decision Making: Impaired  Sensation Coordination Gross Motor Movements are Fluid and Coordinated: No Fine Motor Movements are Fluid and  Coordinated: No Coordination and Movement Description: Severe genalized weakness 2/2 polymyositis as well as hx L hemiperesis and new R hemiperesis  Motor  Motor Motor - Skilled Clinical Observations: BLE weakness, RUE weakness, generalized deconditioning, hx of polymyositis Motor - Discharge Observations: BLE weakness, RUE weakness, generalized deconditioning, hx of polymyositis   Mobility Bed Mobility Bed Mobility: Rolling Right;Rolling Left;Supine to Sit;Sit to Supine (on mat table) Rolling Right: 2 Helpers Rolling Right Details: Tactile cues for initiation;Manual facilitation for weight shifting;Manual facilitation for placement;Verbal cues for technique;Verbal cues for sequencing;Tactile cues for weight shifting;Tactile cues for posture;Tactile cues for placement Rolling Left: 2 Helpers Rolling Left Details: Tactile cues for initiation;Tactile cues for weight shifting;Tactile cues for placement;Tactile cues for posture;Verbal cues for sequencing;Verbal cues for technique;Manual facilitation for placement;Manual facilitation for weight shifting Supine to Sit: 2 Helpers Supine to Sit Details: Manual facilitation for placement;Manual facilitation for weight shifting;Verbal cues for technique;Tactile cues for initiation;Tactile cues for weight shifting;Tactile cues for placement;Tactile cues for posture;Verbal cues for sequencing Sit to Supine: 2 Helpers Sit to Supine - Details: Verbal cues for technique;Manual facilitation for weight shifting;Manual facilitation for placement;Tactile cues for weight shifting;Tactile cues for sequencing;Tactile cues for posture;Tactile cues for placement;Tactile cues for initiation;Verbal cues for sequencing;Verbal cues for precautions/safety Transfers Transfers: Lateral/Scoot Transfers with slide board Lateral/Scoot Transfers: 2 Helpers Lateral/Scoot Transfer Details: Tactile cues for initiation;Tactile cues for weight shifting;Tactile cues for  placement;Tactile cues for sequencing;Tactile cues for posture;Verbal cues for sequencing;Verbal cues for technique;Manual facilitation for placement;Manual facilitation for weight shifting Lateral/Scoot Transfer Details (indicate cue type and reason): (slide board)   Locomotion  Gait Ambulation: No Gait Gait: No Stairs / Additional Locomotion Stairs: No Wheelchair Mobility Wheelchair Mobility: Yes Wheelchair Assistance: 5: Supervision;6: Modified independent (Device/Increase time)(power w/c) Wheelchair Propulsion: Left upper extremity(requries control to be on L side) Wheelchair Parts Management: Needs assistance  Distance: 150 ft    Trunk/Postural Assessment  Cervical Assessment Cervical Assessment: Exceptions to WFL(Forward head) Thoracic Assessment Thoracic Assessment: Exceptions to WFL(Rounded shoulders; kyphotic) Lumbar Assessment Lumbar Assessment: Exceptions to WFL(Posterior pelvic tilt) Postural Control Postural Control: Deficits on evaluation(Delayed and insufficient 2/2 generalized weakness throughout)   Balance Balance Balance Assessed: Yes Static Sitting Balance Static Sitting - Balance Support: Feet supported Static Sitting - Level of Assistance: 5: Stand by assistance Dynamic Sitting Balance Dynamic Sitting - Balance Support: Feet unsupported;Right upper extremity supported Dynamic Sitting - Level of Assistance: 5: Stand by assistance Dynamic Sitting - Balance Activities: Reaching across midline;Reaching for objects Sitting balance - Comments: able to sit EOB unsupported but demonstrates a left bias at times   Extremity Assessment  BUE: significant shoulder & overall weakness 2/2 hx of polymyositis, decreased grasp BUE (R>L) BLE: 1/5 BLE ankle dorsiflexion, 1/5 BLE hip flexion, 2-/5 BLE knee extension Unable to achieve neutral alignment at hips, knees, or ankles 2/2 significant tightness and potential muscle contractures starting to develop  See Function  Navigator for Current Functional Status.  Waunita Schooner 06/24/2017, 4:37 PM

## 2017-06-24 NOTE — Progress Notes (Signed)
Occupational Therapy Session Note  Patient Details  Name: [name] [name] MRN: [id number] Date of Birth: Apr 28, 1940  Today's Date: 06/24/2017 OT Individual Time: 2947-6546 OT Individual Time Calculation (min): 56 min    Short Term Goals: Week 2:  OT Short Term Goal 1 (Week 2): STG=LTG due to LOS  Skilled Therapeutic Interventions/Progress Updates:    Pt seen for OT ADL bathing/dressing session. Pt in supine upon arrival, agreeable to tx session and denying pain. Pt requesting to complete bathing/dressing this session LB bathing/dressing completed from bed elvel. Pt able to bring LEs in towards body in order to reach to doff B socks with significantly increased time and effort. Pt able to assist with washing of B thights using L UE with assist for se-tup and to bring arm up on top of knee, however, pt fatigues very qucikly and therapist assist for thoroughness and completion. Pants threaded with max A. Attempted with multimodal cuing to teach pt briding in supine for pants to be pulled up, however, uanble to motor plan to complete task at functional level and therefore rolled to have pants pulled up.  She transferred to EOB with max A +1. She was able to sit EOB with supervision. Completed sliding board transfer into power w/c with total A +1 with +2 for stabilizing equipment and safety. Grooming tasks completed from w/c level at sink, assist for set-up and positioning of UEs. Pt able to grasp wash cloth and squeeze to ring out. Pt left seated in w/c at sink with NT present to assist with oral care.   Therapy Documentation Precautions:  Precautions Precautions: Fall Type of Shoulder Precautions: R shoulder weakness and subluxation most recent stroke Restrictions Weight Bearing Restrictions: No Pain:    No/denies pain  See Function Navigator for Current Functional Status.   Therapy/Group: Individual Therapy  Ozias Dicenzo L 06/24/2017, 7:04 AM

## 2017-06-24 NOTE — Discharge Summary (Signed)
Physician Discharge Summary  Patient ID: [name] [name] MRN: [id number] DOB/AGE: [birth year] 77 y.o.  Admit date: 06/12/2017 Discharge date: 06/25/2017  Discharge Diagnoses:  Principal Problem:   Left middle cerebral artery stroke [facility]) Active Problems:   Polymyositis (HCC)   Anemia   Multiple pulmonary nodules   Chronic diastolic congestive heart failure (HCC)   Neuropathic pain   Discharged Condition: stable   Significant Diagnostic Studies: N/A   Labs:  Basic Metabolic Panel: BMP Latest Ref Rng & Units 06/22/2017 06/19/2017 06/15/2017  Glucose 65 - 99 mg/dL 145(H) - 121(H)  BUN 6 - 20 mg/dL 23(H) - 13  Creatinine 0.44 - 1.00 mg/dL 0.80 0.78 0.68  Sodium 135 - 145 mmol/L 142 - 142  Potassium 3.5 - 5.1 mmol/L 3.8 - 3.0(L)  Chloride 101 - 111 mmol/L 104 - 101  CO2 22 - 32 mmol/L 26 - 30  Calcium 8.9 - 10.3 mg/dL 9.4 - 8.9    CBC: CBC Latest Ref Rng & Units 06/15/2017 06/13/2017 06/09/2017  WBC 4.0 - 10.5 K/uL 6.1 7.9 -  Hemoglobin 12.0 - 15.0 g/dL 10.1(L) 10.3(L) 10.5(L)  Hematocrit 36.0 - 46.0 % 33.3(L) 33.7(L) 31.0(L)  Platelets 150 - 400 K/uL 278 290 -    CBG: No results for input(s): GLUCAP in the last 168 hours.  Brief HPI:   [name] [name] is a 77 year old female with history of HTN, polymyositis, chronic diastolic CHF, colonic AVM who was admitted on 06/09/17 with numbness and weakness. MRI brain done revealing patchy acute nonhemorrhagic infarcts in left frontoparietal MCA territories, subacute subcentimeter left thalamus infarct and old right BG/thalamic infarcts. CTA head/neck showed multifocal severe stenosis ACA/MCA felt to be due to atherosclerosis or HTN vasculolpathy and incidental findings of multiple RUL pulmonary nodules. Neurology recommended ASA/Plavix for 3 months.  Therapy evaluations done revealing functional decline and CIR recommended by rehab team.    Hospital Course: [name] [name] was admitted to rehab 06/12/2017 for inpatient therapies to consist of  PT, ST and OT at least three hours five days a week. Past admission physiatrist, therapy team and rehab RN have worked together to provide customized collaborative inpatient rehab. Routine pressure relief measures have been ongoing to prevent breakdown. Follow up labs showed hypokalemia and this has resolved with supplementation.  Blood pressures were monitored on bid basis and are stable on lasix and tenormin.  Mild pre-renal azotemia noted on most recent labs and recommend encouraging fluid intake.  Chronic diastolic CHF is compensated without signs of overload and weight is relatively stable.  CBC showed normocytic normochromic anemia and H/H is stable. Stool guaiacs ordered and still pending. Insomnia has improved with increase in xanax. Endurance has improved but she continued to be limited by hip and shoulder girdle weakness and reported being WC bound for months to years therefore goals were down graded to prior level of function. Safety at home was discussed as patient without adequate family support and legally blind son worked 3 days a week.  SNF recommended and she was discharged to [facility] on 06/25/17.     Rehab course: During patient's stay in rehab weekly team conferences were held to monitor patient's progress, set goals and discuss barriers to discharge. At admission, patient required total assist with ADLs and mobility. Speech therapy evaluation revealed mild higher level cognitive deficits.  She  has had improvement in activity tolerance, balance, postural control as well as ability to compensate for deficits.  Her goals were downgraded to her prior  level of function which was max to total assist. She requires max to total assist at bed level for ADL tasks. She requires max assist for bed mobility and total assist+2 for SB transfers. Speech therapy has focused on functional problem solving and patient currently requires min assist with problem solving and for awareness of deficits.      Disposition: Skilled Nursing Facility  Diet: Heart Healthy   Special Instructions: 1. Will need chest CT in 3-6 months to follow up on pulmonary nodules. 2. Encourage fluid intake. Repeat BMET in 3-5 days   Discharge Instructions    Ambulatory referral to Physical Medicine Rehab   Complete by:  As directed    3-4 weeks follow up appointment     Allergies as of 06/25/2017      Reactions   Codeine Other (See Comments)   Feels drunk   Hydrocodone    "makes me sleepy"   Tramadol Hcl Other (See Comments)   Makes me feel drunk      Medication List    STOP taking these medications   allopurinol 300 MG tablet Commonly known as:  ZYLOPRIM   benzonatate 100 MG capsule Commonly known as:  TESSALON   CARAFATE 1 GM/10ML suspension Generic drug:  sucralfate   clopidogrel 75 MG tablet Commonly known as:  PLAVIX   colchicine 0.6 MG tablet   DUREZOL 0.05 % Emul Generic drug:  Difluprednate   esomeprazole 40 MG capsule Commonly known as:  Harding Replaced by:  pantoprazole 40 MG tablet   loratadine 10 MG tablet Commonly known as:  CLARITIN   mycophenolate 500 MG tablet Commonly known as:  CELLCEPT Replaced by:  mycophenolate 250 MG capsule   omega-3 acid ethyl esters 1 g capsule Commonly known as:  LOVAZA   OVER THE COUNTER MEDICATION   PAPAYA PO   PROLENSA 0.07 % Soln Generic drug:  Bromfenac Sodium   psyllium 95 % Pack Commonly known as:  HYDROCIL/METAMUCIL   sodium chloride 0.65 % nasal spray Commonly known as:  OCEAN   VITAMIN A PO   VITAMIN D PO   VITAMIN E PO     TAKE these medications   acetaminophen 650 MG CR tablet Commonly known as:  TYLENOL 8 HOUR Take 1 tablet (650 mg total) by mouth every 8 (eight) hours as needed. What changed:  reasons to take this   ALPRAZolam 0.5 MG tablet--Rx #5 pills Commonly known as:  XANAX Take 1 tablet (0.5 mg total) by mouth 2 (two) times daily as needed for anxiety or sleep. What changed:    medication  strength  how much to take   aspirin 81 MG chewable tablet Chew 1 tablet (81 mg total) by mouth daily.   atenolol 50 MG tablet Commonly known as:  TENORMIN Take 50 mg by mouth daily.   atorvastatin 10 MG tablet Commonly known as:  LIPITOR Take 1 tablet (10 mg total) by mouth daily at 6 PM.   CALTRATE 600 1500 (600 Ca) MG Tabs tablet Generic drug:  calcium carbonate Take 2 tablets by mouth daily.   fluticasone 50 MCG/ACT nasal spray Commonly known as:  FLONASE Place 1 spray into both nostrils daily as needed for allergies.   folic acid 397 MCG tablet Commonly known as:  FOLVITE Take 400 mcg by mouth daily.   furosemide 40 MG tablet Commonly known as:  LASIX Take 40 mg by mouth daily.   gabapentin 100 MG capsule Commonly known as:  NEURONTIN TAKE ONE CAPSULE BY  MOUTH TWICE DAILY What changed:    how much to take  how to take this  when to take this   mycophenolate 250 MG capsule Commonly known as:  CELLCEPT Take 2 capsules (500 mg total) by mouth 2 (two) times daily. Replaces:  mycophenolate 500 MG tablet   pantoprazole 40 MG tablet Commonly known as:  PROTONIX Take 1 tablet (40 mg total) by mouth daily. Replaces:  esomeprazole 40 MG capsule   potassium chloride SA 20 MEQ tablet Commonly known as:  K-DUR,KLOR-CON Take 2 tablets (40 mEq total) by mouth daily. What changed:  when to take this   senna-docusate 8.6-50 MG tablet Commonly known as:  Senokot-S Take 2 tablets by mouth at bedtime as needed for mild constipation.   ULORIC 40 MG tablet Generic drug:  febuxostat Take 40 mg by mouth daily.   vitamin B-12 1000 MCG tablet Commonly known as:  CYANOCOBALAMIN Take 1,000 mcg by mouth daily.   vitamin C 1000 MG tablet Take 1,000 mg by mouth daily.       Contact information for follow-up providers    Kirsteins, Luanna Salk, MD Follow up.   Specialty:  Physical Medicine and Rehabilitation Why:  Office to call for appointment Contact  information: [place] [address] [phone]        Garvin Fila, MD Follow up.   Specialties:  Neurology, Radiology Why:  Call for appointment 6 weeks Contact information: [place] [address] [phone]            Contact information for after-discharge care    Destination    [facility] .   Service:  Skilled Nursing Contact information: [place] [phone]                  Signed: Bary Leriche 06/25/2017, 12:17 PM

## 2017-06-24 NOTE — Progress Notes (Addendum)
Physical Therapy Session Note  Patient Details  Name: [name] [name] MRN: [id number] Date of Birth: 16-Dec-1940  Today's Date: 06/24/2017 PT Individual Time: 8088-1103 and 1505-1606 PT Individual Time Calculation (min): 54 min and 61 min  Short Term Goals: Week 2:  PT Short Term Goal 1 (Week 2): STG = LTG due to estimated d/c date/discharge to SNF.  Skilled Therapeutic Interventions/Progress Updates:  Treatment 1: Pt received in w/c & agreeable to tx. No c/o pain reported. Pt maneuvers power w/c room<>gym without assistance, but does require assistance for using controls to change angle of w/c (ex: recline). Pt completes slide board transfer w/c<>mat table, always going to lower surface, with +2 assist. Pt requires total assist for slide board placement and to initiate movement as pt unable to push/pull with LUE even with multimodal cuing for sequencing and technique. While sitting on EOM pt engaged in reaching to L/R and anteriorly to retreive objects then toss them with task focusing on dynamic balance, as pt without UE/LE support. Pt able to control trunk when reaching outside of BOS and return to midline after leaning down onto R elbow. Pt does have difficulty returning to upright posture after performing anterior weight shift, unless she uses momentum to return to upright sitting. Transitioned to performing anterior weight shifting and activating muscles to assist her with returning to upright sitting with pt exerting great effort to complete task. At end of session pt left sitting in power w/c with NT entering room.   Treatment 2: Pt received in power w/c & agreeable to tx, but reporting fatigue. Pt maneuvered power w/c on unit and down to gift shop to practice w/c mobility in community setting & small spaces. Pt required supervision in gift shop & min cuing for obstacle avoidance, required mod I on unit. Back in gym pt transferred w/c<>mat table always to lower surface with slide board +2  assist. Provided +2 assist to assist pt into prone position but pt with such tight hip flexors she is unable to achieve neutral hip extension and completely prone. Pt also reporting discomfort in UE with prone and assisted back to w/c. At end of session pt left in w/c with seat belt donned & needs within reach.   Therapy Documentation Precautions:  Precautions Precautions: Fall Type of Shoulder Precautions: R shoulder weakness and subluxation most recent stroke Restrictions Weight Bearing Restrictions: No   See Function Navigator for Current Functional Status.   Therapy/Group: Individual Therapy  Waunita Schooner 06/24/2017, 4:17 PM

## 2017-06-24 NOTE — Progress Notes (Signed)
Occupational Therapy Session Note  Patient Details  Name: MELODEE LUPE MRN: [id number] Date of Birth: 1940/11/15  Today's Date: 06/24/2017 OT Individual Time: 1000-1030 OT Individual Time Calculation (min): 30 min    Short Term Goals: Week 2:  OT Short Term Goal 1 (Week 2): STG=LTG due to LOS  Skilled Therapeutic Interventions/Progress Updates:    1:1 Focus on toilet transfer with slide board with total A but with ability to maintain forward posture. Continued focus on lateral leans for caregiver to assist with clothing management and hygiene. REturn to power chair. Pt doffed and donned clean shirt with max A with more than reasonable amt of time but increased right UE movement with forced use with hemi dressing techniques.   Therapy Documentation Precautions:  Precautions Precautions: Fall Type of Shoulder Precautions: B shoulder weakness and subluxation from most recent stroke Restrictions Weight Bearing Restrictions: No General:   Vital Signs: Therapy Vitals Temp: 98.8 F (37.1 C) Temp Source: Oral Pulse Rate: 87 Resp: 16 BP: 99/84 Patient Position (if appropriate): Sitting Oxygen Therapy SpO2: 90 % O2 Device: Room Air Pain:  no c/o pain   See Function Navigator for Current Functional Status.   Therapy/Group: Individual Therapy  Willeen Cass [facility] 06/24/2017, 4:08 PM

## 2017-06-24 NOTE — Progress Notes (Signed)
Occupational Therapy Discharge Summary  Patient Details  Name: [name] [name] MRN: [id number] Date of Birth: [birth year]    Patient has met 7 of 7 long term goals due to improved activity tolerance, improved balance, postural control, ability to compensate for deficits and improved coordination.  Patients goals were modified to be max- total A overall as that was pt's PLOF. Patient to discharge at overall Total Assist level.  Patient's family is unable to provide the recommended 24 hr total physical assist pt requires and therefore d/c to SNF in order to cont to decrease caregiver burden and increase level of independence with ADLs. Pt was mod-max A bed level ADLs PTA. Her blind son provided total A for squat pivot transfers to power w/c. She required total A for toileting on [facility] with son providing assist. Pt currently requires max A for bed mobility, total A +2 sliding board transfers and max- total A bed level ADLs.   Recommendation:  Patient will benefit from ongoing skilled OT services in skilled nursing facility setting to continue to advance functional skills in the area of BADL and Reduce care partner burden.  Equipment: To be determined at next venue of care  Reasons for discharge: treatment goals met and discharge from hospital  Patient/family agrees with progress made and goals achieved: Yes  OT Discharge Precautions/Restrictions  Precautions Precautions: Fall Type of Shoulder Precautions: B shoulder weakness and subluxation from most recent stroke Restrictions Weight Bearing Restrictions: No Vision Baseline Vision/History: Wears glasses Wears Glasses: Reading only Patient Visual Report: No change from baseline Vision Assessment?: No apparent visual deficits Perception  Perception: Within Functional Limits Praxis Praxis: Intact Cognition Overall Cognitive Status: Within Functional Limits for tasks assessed(At baseline) Arousal/Alertness: Awake/alert Orientation Level:  Oriented X4 Attention: Selective Selective Attention: Appears intact Memory: Appears intact Awareness: Appears intact(At baseline) Problem Solving: Appears intact(At baseline, at functional level) Safety/Judgment: Appears intact Sensation Sensation Light Touch: Impaired Detail Light Touch Impaired Details: Impaired RUE Proprioception: Impaired Detail Proprioception Impaired Details: Impaired RUE Coordination Gross Motor Movements are Fluid and Coordinated: No Fine Motor Movements are Fluid and Coordinated: No Coordination and Movement Description: Severe genalized weakness 2/2 polymyositis as well as hx L hemiperesis and new R hemiperesis Finger Nose Finger Test: Unable to perform 2/2 B UE weakness Motor  Motor Motor: Tetraplegia;Other (comment) Motor - Skilled Clinical Observations: BLE weakness, RUE weakness, generalized deconditioning, hx of polymyositis Motor - Discharge Observations: B UE/ LE weakness, generalized deconditioning, hx of polymyositis and previous CVAs Trunk/Postural Assessment  Cervical Assessment Cervical Assessment: Exceptions to WFL(Forward head) Thoracic Assessment Thoracic Assessment: Exceptions to WFL(Rounded shoulders; kyphotic) Lumbar Assessment Lumbar Assessment: Exceptions to WFL(Posterior pelvic tilt) Postural Control Postural Control: Deficits on evaluation(Delayed and insufficient 2/2 generalized weakness throughout)  Balance Balance Balance Assessed: Yes Static Sitting Balance Static Sitting - Balance Support: Feet supported Static Sitting - Level of Assistance: 5: Stand by assistance Dynamic Sitting Balance Dynamic Sitting - Balance Support: Feet unsupported;During functional activity Dynamic Sitting - Level of Assistance: 5: Stand by assistance;4: Min assist Dynamic Sitting - Balance Activities: Reaching across midline;Reaching for objects Sitting balance - Comments: able to sit EOB unsupported but demonstrates a left bias at  times Extremity/Trunk Assessment RUE Assessment RUE Assessment: Exceptions to [facility] Passive Range of Motion (PROM) Comments: [facility] General Strength Comments: Hx of generalized weakness 2/2 polymyositis with new R hemiperesis RUE Body System: Neuro RUE Strength RUE Overall Strength: Deficits(1/5 shoulder flexion; 3/5 elbow flexion/ extention, trace digit extention) LUE Assessment LUE Assessment: Exceptions to  [facility] General Strength Comments: 2/5 throughout with functional gross grasp and fine motor movements LUE Body System: Neuro   See Function Navigator for Current Functional Status.  Josealberto Montalto L 06/24/2017, 3:16 PM

## 2017-06-24 NOTE — Progress Notes (Signed)
Nutrition Follow-up  DOCUMENTATION CODES:   Non-severe (moderate) malnutrition in context of chronic illness, Obesity unspecified  INTERVENTION:  Continue Ensure Enlive po BID, each supplement provides 350 kcal and 20 grams of protein.  Encourage adequate PO intake.   NUTRITION DIAGNOSIS:   Moderate Malnutrition related to chronic illness(polymoysitis) as evidenced by mild fat depletion, moderate fat depletion, mild muscle depletion, moderate muscle depletion; ongoing  GOAL:   Patient will meet greater than or equal to 90% of their needs; met  MONITOR:   PO intake, Supplement acceptance, Weight trends, Labs  REASON FOR ASSESSMENT:   Malnutrition Screening Tool    ASSESSMENT:   77 year old right-handed female with history of hypertension, polymyositis, diastolic congestive heart failure, colonic AVM.  Presented to [facility] emergency room 06/09/2017 with complaints of generalized numbness and weakness.  At baseline patient is essentially wheelchair-bound and has chronic bilateral upper extremity weakness attributed to polymyositis.  Meal completion has been 35-100% with most intake at least 50-70%. Pt currently has Ensure ordered and has been consuming them. RD to continue with current orders to aid in caloric and protein needs. Pt encouraged to eat her food at meals and to drink her supplements.    Diet Order:   Diet Order           Diet regular Room service appropriate? Yes; Fluid consistency: Thin  Diet effective 1000          EDUCATION NEEDS:   No education needs have been identified at this time  Skin:  Skin Assessment: Reviewed RN Assessment  Last BM:  6/12  Height:   Ht Readings from Last 1 Encounters:  06/12/17 _0  (1.6 m)    Weight:   Wt Readings from Last 1 Encounters:  06/24/17 145 lb 11.6 oz (66.1 kg)    Ideal Body Weight:  52.27 kg  BMI:  Body mass index is 25.81 kg/m.  Estimated Nutritional Needs:   Kcal:  1700-1850  Protein:   75-90 grams  Fluid:  1.7 - 1.8 L/day    Corrin Parker, MS, RD, LDN Pager # [phone] After hours/ weekend pager # [phone]

## 2017-06-24 NOTE — Progress Notes (Signed)
[facility] PHYSICAL MEDICINE & REHABILITATION     PROGRESS NOTE  Subjective/Complaints:   Per nsg some problems taking large pills in pudding OT reports some increased movement in the right hand  ROS: Patient denies fever, rash, sore throat, blurred vision, nausea, vomiting, diarrhea, cough, shortness of breath or chest pain, joint or back pain, headache, or mood change.         Objective: Vital Signs: Blood pressure 121/65, pulse 89, temperature 98.2 F (36.8 C), temperature source Oral, resp. rate 16, height 5' 3"  (1.6 m), weight 66.1 kg (145 lb 11.6 oz), SpO2 97 %. No results found. No results for input(s): WBC, HGB, HCT, PLT in the last 72 hours. Recent Labs    06/22/17 0914  NA 142  K 3.8  CL 104  GLUCOSE 145*  BUN 23*  CREATININE 0.80  CALCIUM 9.4   CBG (last 3)   No results for input(s): GLUCAP in the last 72 hours.  Wt Readings from Last 3 Encounters:  06/24/17 66.1 kg (145 lb 11.6 oz)  06/09/17 77.1 kg (170 lb)  05/15/17 77.1 kg (170 lb)    Physical Exam:  BP 121/65 (BP Location: Left Arm)   Pulse 89   Temp 98.2 F (36.8 C) (Oral)   Resp 16   Ht 5' 3"  (1.6 m)   Wt 66.1 kg (145 lb 11.6 oz)   SpO2 97%   BMI 25.81 kg/m  Constitutional: No distress . Vital signs reviewed. HEENT: EOMI, oral membranes moist Neck: supple Cardiovascular: RRR without murmur. No JVD    Respiratory: CTA Bilaterally without wheezes or rales. Normal effort    GI: BS +, non-tender, non-distended  Musculoskeletal: No edema or tenderness in extremities  Neurological: She isalertand oriented. Dysarthria.  Motor: 1/5 bilateral delt , bilateral hip flex, add, abd 2-bilateral knee ext, 0/5 bilateral hip add,3- bilat biceps, 4- left and 0/5 RIght  finger flex ext, 3- bilateral ankle  PF/DF Scapular winging on left side Assessment/Plan: 1. Functional deficits secondary to Left frontoparietal MCA infarction with history of polymyositis which require 3+ hours per day of  interdisciplinary therapy in a comprehensive inpatient rehab setting. Physiatrist is providing close team supervision and 24 hour management of active medical problems listed below. Physiatrist and rehab team continue to assess barriers to discharge/monitor patient progress toward functional and medical goals.  Function:  Bathing Bathing position   Position: Bed  Bathing parts Body parts bathed by patient: Right upper leg, Left upper leg Body parts bathed by helper: Right arm, Left arm, Chest, Abdomen, Front perineal area, Buttocks, Right upper leg, Left upper leg, Right lower leg, Left lower leg, Back  Bathing assist Assist Level: 2 helpers      Upper Body Dressing/Undressing Upper body dressing   What is the patient wearing?: Pull over shirt/dress       Pull over shirt/dress - Perfomed by helper: Thread/unthread right sleeve, Thread/unthread left sleeve, Put head through opening, Pull shirt over trunk        Upper body assist Assist Level: (Total A)      Lower Body Dressing/Undressing Lower body dressing   What is the patient wearing?: Pants, Non-skid slipper socks       Pants- Performed by helper: Pull pants up/down   Non-skid slipper socks- Performed by helper: Don/doff right sock, Don/doff left sock                  Lower body assist Assist for lower body dressing: Touching or steadying  assistance (Pt > 75%)      Toileting Toileting Toileting activity did not occur: Safety/medical concerns   Toileting steps completed by helper: Adjust clothing prior to toileting, Performs perineal hygiene, Adjust clothing after toileting Toileting Assistive Devices: Grab bar or rail  Toileting assist Assist level: Two helpers   Transfers Chair/bed transfer   Chair/bed transfer method: Lateral scoot Chair/bed transfer assist level: 2 helpers Chair/bed transfer assistive device: Sliding board Mechanical lift: Maximove   Locomotion Ambulation Ambulation activity did not  occur: Safety/medical concerns         Wheelchair   Type: Motorized Max wheelchair distance: 150 ft  Assist Level: Supervision or verbal cues  Cognition Comprehension Comprehension assist level: Follows basic conversation/direction with extra time/assistive device  Expression Expression assist level: Expresses basic needs/ideas: With extra time/assistive device  Social Interaction Social Interaction assist level: Interacts appropriately 75 - 89% of the time - Needs redirection for appropriate language or to initiate interaction.  Problem Solving Problem solving assist level: Solves basic 75 - 89% of the time/requires cueing 10 - 24% of the time  Memory Memory assist level: Recognizes or recalls 75 - 89% of the time/requires cueing 10 - 24% of the time    Medical Problem List and Plan: 1.Decreased functional mobilitysecondary to left frontoparietal MCA infarction on 0/94 complicated by history of polymyositis  Continue CIR PT, OT, SLP-tent d/c 6/14 but now for SNF Team conference today please see physician documentation under team conference tab, met with team face-to-face to discuss problems,progress, and goals. Formulized individual treatment plan based on medical history, underlying problem and comorbidities.  FL2 signed stable when bed availiable 2. DVT Prophylax notes reviewed, labs reviewed, is/Anticoagulation: Subcutaneous Lovenox. Monitor for any bleeding episodes 3. Pain Management:Neurontin 100 mg twice daily Tylenol as needed 4. Mood:Xanax 0.25 mg twice daily as needed 5. Neuropsych: This patientiscapable of making decisions on herown behalf. 6. Skin/Wound Care:Routine skin checks  -Aloe and local care to skin.  I see no signs of breakdown in the buttocks.  -Checked with nurse also and she reports no problems there either other than patient's complaints. May be muscular related to #8 below 7. Fluids/Electrolytes/Nutrition:Routine in and outs   6/3 HypoK- cont KCL  16mq daily, recheck 6/10- 3.8  6/1 creat  0.80 8.Polymyositis. Continue CellCept 500 mg twice daily. 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 Monitor for any signs of fluid overload Filed Weights   06/22/17 0506 06/23/17 0542 06/24/17 0500  Weight: 65.1 kg (143 lb 8.3 oz) 66.2 kg (145 lb 15.1 oz) 66.1 kg (145 lb 11.6 oz)   Daily weights  fluctuate 10.Hypertension. Tenormin 50 mg daily, Lasix 40 mg daily prior to admission.  Vitals:   06/23/17 1921 06/24/17 0500  BP: (!) 120/93 121/65  Pulse: 91 89  Resp: 18 16  Temp: 98 F (36.7 C) 98.2 F (36.8 C)  SpO2: 92% 97%    controlled on 6/12 11.Pulmonary nodules. Incidental finding on CTA of neck. Noncontrast CT 3 to 6 months 12.Hyperlipidemia. Lipitor 13.   Anemia- normocytic normochromic,  stool guaic pending    Hemoglobin 10.1 on 6/3   Continue to monitor  LOS (Days) 12 A FACE TO FACE EVALUATION WAS PERFORMED  ACharlett Blake6/12/2017 8:27 AM

## 2017-06-25 ENCOUNTER — Inpatient Hospital Stay (HOSPITAL_COMMUNITY): Payer: Medicare Other | Admitting: Occupational Therapy

## 2017-06-25 MED ORDER — HEPARIN SOD (PORK) LOCK FLUSH 100 UNIT/ML IV SOLN
500.0000 [IU] | INTRAVENOUS | Status: AC | PRN
  Administered 2017-06-25: 500 [IU]

## 2017-06-25 NOTE — Progress Notes (Signed)
Social Work Patient ID: [name] [name], female   DOB: 11-30-1940, 77 y.o.   MRN: [id number]   CSW met with pt and her dtr on 06-24-17 to present SNF bed offers and they have chosen [facility].  CSW alerted team and SNF and transfer will occur today.  CSW will facilitate this via PTAR.

## 2017-06-25 NOTE — Progress Notes (Signed)
[name] [name] PHYSICAL MEDICINE & REHABILITATION     PROGRESS NOTE  Subjective/Complaints:   Pt asking which facility she is going to   Objective: Vital Signs: Blood pressure 122/60, pulse 93, temperature 98.3 F (36.8 C), temperature source Oral, resp. rate 18, height 5\' 3"  (1.6 m), weight 66.2 kg (145 lb 15.1 oz), SpO2 97 %. No results found. No results for input(s): WBC, HGB, HCT, PLT in the last 72 hours. Recent Labs    06/22/17 0914  NA 142  K 3.8  CL 104  GLUCOSE 145*  BUN 23*  CREATININE 0.80  CALCIUM 9.4   CBG (last 3)   No results for input(s): GLUCAP in the last 72 hours.  Wt Readings from Last 3 Encounters:  06/25/17 66.2 kg (145 lb 15.1 oz)  06/09/17 77.1 kg (170 lb)  05/15/17 77.1 kg (170 lb)    Physical Exam:  BP 122/60 (BP Location: Left Arm)   Pulse 93   Temp 98.3 F (36.8 C) (Oral)   Resp 18   Ht 5\' 3"  (1.6 m)   Wt 66.2 kg (145 lb 15.1 oz)   SpO2 97%   BMI 25.85 kg/m  Constitutional: No distress . Vital signs reviewed. HEENT: EOMI, oral membranes moist Neck: supple Cardiovascular: RRR without murmur. No JVD    Respiratory: CTA Bilaterally without wheezes or rales. Normal effort    GI: BS +, non-tender, non-distended  Musculoskeletal: No edema or tenderness in extremities  Neurological: She isalertand oriented. Dysarthria.  Motor: 1/5 bilateral delt , bilateral hip flex, add, abd 2-bilateral knee ext, 0/5 bilateral hip add,3- bilat biceps, 4- left and 0/5 RIght  finger flex ext, 3- bilateral ankle  PF/DF Scapular winging on left side Assessment/Plan: 1. Functional deficits secondary to Left frontoparietal MCA infarction with history of polymyositis. Stable for D/C today F/u PCP in 3-4 weeks F/u PM&R 2 weeks See D/C summary See D/C instructions  Function:  Bathing Bathing position   Position: Bed  Bathing parts Body parts bathed by patient: Right upper leg, Left upper leg Body parts bathed by helper: Right arm, Left arm, Chest,  Abdomen, Front perineal area, Buttocks, Right lower leg, Left lower leg, Back  Bathing assist Assist Level: 2 helpers      Upper Body Dressing/Undressing Upper body dressing   What is the patient wearing?: Pull over shirt/dress     Pull over shirt/dress - Perfomed by patient: Thread/unthread left sleeve, Put head through opening, Pull shirt over trunk Pull over shirt/dress - Perfomed by helper: Thread/unthread right sleeve        Upper body assist Assist Level: Touching or steadying assistance(Pt > 75%)      Lower Body Dressing/Undressing Lower body dressing   What is the patient wearing?: Pants, Non-skid slipper socks       Pants- Performed by helper: Thread/unthread right pants leg, Thread/unthread left pants leg, Pull pants up/down   Non-skid slipper socks- Performed by helper: Don/doff right sock, Don/doff left sock(Pt able to doff B socks; total A to don)                  Lower body assist Assist for lower body dressing: Touching or steadying assistance (Pt > 75%)      Toileting Toileting Toileting activity did not occur: Safety/medical concerns   Toileting steps completed by helper: Adjust clothing prior to toileting, Performs perineal hygiene, Adjust clothing after toileting Toileting Assistive Devices: Grab bar or rail  Toileting assist Assist level: Two helpers  Transfers Chair/bed transfer   Chair/bed transfer method: Lateral scoot Chair/bed transfer assist level: 2 helpers Chair/bed transfer assistive device: Sliding board Mechanical lift: Maximove   Locomotion Ambulation Ambulation activity did not occur: Safety/medical concerns         Wheelchair   Type: Motorized Max wheelchair distance: >150 ft Assist Level: Supervision or verbal cues, No help, No cues, assistive device, takes more than reasonable amount of time  Cognition Comprehension Comprehension assist level: Follows basic conversation/direction with extra time/assistive device   Expression Expression assist level: Expresses basic needs/ideas: With extra time/assistive device  Social Interaction Social Interaction assist level: Interacts appropriately 75 - 89% of the time - Needs redirection for appropriate language or to initiate interaction.  Problem Solving Problem solving assist level: Solves basic 75 - 89% of the time/requires cueing 10 - 24% of the time  Memory Memory assist level: Recognizes or recalls 75 - 89% of the time/requires cueing 10 - 24% of the time    Medical Problem List and Plan: 1.Decreased functional mobilitysecondary to left frontoparietal MCA infarction on 1/61 complicated by history of polymyositis  Continue CIR PT, OT, SLP-tent d/c 6/14 but now for SNF  FL2 signed stable when bed availiable 2. DVT Prophylax notes reviewed, labs reviewed, is/Anticoagulation: Subcutaneous Lovenox. Monitor for any bleeding episodes 3. Pain Management:Neurontin 100 mg twice daily Tylenol as needed 4. Mood:Xanax 0.25 mg twice daily as needed 5. Neuropsych: This patientiscapable of making decisions on herown behalf. 6. Skin/Wound Care:Routine skin checks  -Aloe and local care to skin.  I see no signs of breakdown in the buttocks.  -Checked with nurse also and she reports no problems there either other than patient's complaints. May be muscular related to #8 below 7. Fluids/Electrolytes/Nutrition:Routine in and outs   6/3 HypoK- cont KCL 19meq daily, recheck 6/10- 3.8  6/1 creat  0.80 8.Polymyositis. Continue CellCept 500 mg twice daily. 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 Monitor for any signs of fluid overload Filed Weights   06/23/17 0542 06/24/17 0500 06/25/17 0419  Weight: 66.2 kg (145 lb 15.1 oz) 66.1 kg (145 lb 11.6 oz) 66.2 kg  (145 lb 15.1 oz)   Daily weights  fluctuate 10.Hypertension. Tenormin 50 mg daily, Lasix 40 mg daily prior to admission.  Vitals:   06/24/17 2040 06/25/17 0419  BP: 119/63 122/60  Pulse: 83 93  Resp: 18 18  Temp: 98.3 F (36.8 C) 98.3 F (36.8 C)  SpO2:  97%    controlled on 6/12 11.Pulmonary nodules. Incidental finding on CTA of neck. Noncontrast CT 3 to 6 months 12.Hyperlipidemia. Lipitor 13.   Anemia- normocytic normochromic,  stool guaic pending    Hemoglobin 10.1 on 6/3   Continue to monitor  LOS (Days) 13 A FACE TO FACE EVALUATION WAS PERFORMED  Charlett Blake 06/25/2017 8:27 AM

## 2017-06-25 NOTE — Progress Notes (Signed)
Report called to [facility] and Rehab for pt DC. Pt has no /co pain, A&O x4. Daughter packed belongings and left all but 2 for transport to take. Pt in bed with call bed and side rails up x 3. Awaiting PTAR for transport. Pamella Pert, LPN

## 2017-06-25 NOTE — Plan of Care (Signed)
  Problem: RH SKIN INTEGRITY Goal: RH STG SKIN FREE OF INFECTION/BREAKDOWN Description Patient will be free of any skin breakdown entire stay on rehab with min assist  Outcome: Adequate for Discharge   Problem: RH PAIN MANAGEMENT Goal: RH STG PAIN MANAGED AT OR BELOW PT'S PAIN GOAL Description Pain free or less than 3  Outcome: Adequate for Discharge   Problem: RH KNOWLEDGE DEFICIT Goal: RH STG INCREASE KNOWLEDGE OF HYPERTENSION Description Min assist  Outcome: Adequate for Discharge

## 2017-06-26 ENCOUNTER — Inpatient Hospital Stay (HOSPITAL_COMMUNITY): Payer: Medicare Other | Admitting: Physical Therapy

## 2017-06-26 NOTE — Patient Care Conference (Signed)
Inpatient RehabilitationTeam Conference and Plan of Care Update Date: 06/24/2017   Time: 10:30 AM    Patient Name: [name] [name]      Medical Record Number: [id number]  Date of Birth: Jul 18, 1940 Sex: Female         Room/Bed: 4W21C/4W21C-01 Payor Info: Payor: [employer] MEDICARE / Plan: [employer] MEDICARE / Product Type: *No Product type* /    Admitting Diagnosis: CVA  Admit Date/Time:  06/12/2017  4:57 PM Admission Comments: No comment available   Primary Diagnosis:  Left middle cerebral artery stroke [facility]) Principal Problem: Left middle cerebral artery stroke [facility])  Patient Active Problem List   Diagnosis Date Noted  . Neuropathic pain   . Left middle cerebral artery stroke ([place]) 06/12/2017  . Neurologic deficit due to acute ischemic stroke ([place])   . Hypokalemia   . Chronic diastolic congestive heart failure ([place])   . AVM (arteriovenous malformation)   . CVA (cerebral vascular accident) ([place]) 06/09/2017  . Multiple pulmonary nodules 06/09/2017  . Hypothermia 10/22/2016  . Chronic diastolic CHF (congestive heart failure) ([place]) 10/22/2016  . Abdominal pain, unspecified site 05/17/2013  . Nausea alone 05/17/2013  . 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 Abdominal pain, left lower quadrant 05/28/2011  . Cecal ulcer with hemorrhage 01/10/2011  . Acute blood loss anemia 12/06/2010  . Hematochezia 12/05/2010  . Anemia 12/05/2010  . Adrenal insufficiency ([place]) 12/05/2010  . Osteoporosis 12/05/2010  . LUNG INVOLVEMENT OTHER DISEASES CLASSIFIED ELSW 08/13/2009  . OTHER SPECIFIED DISORDER OF STOMACH AND DUODENUM 07/06/2008  . Essential hypertension 04/26/2007  . ALLERGIC RHINITIS 04/26/2007  . GASTROESOPHAGEAL REFLUX DISEASE 04/26/2007  . Polymyositis ([place]) 04/26/2007  . COLONIC POLYPS, ADENOMATOUS 02/06/2005    Expected Discharge Date: Expected Discharge Date: (SNF)  Team Members Present: Physician leading conference: Dr. 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 related to stroke, some increased hand movements  Increased functional usage of right upper extremity to aid in ADLs  Monitor hypokalemia and anemia   Bowel/Bladder   cont b/b; lbm 6/11  maintain continence with mod assist  assess q shift and prn   Swallow/Nutrition/ Hydration   regular, thin liquids   mod I   d/c from [place], goal met   ADL's   +2 sliding board transfers; max A UB dressing; total A LB dressing from bed level; +2 total toileting; mmin A self feeding and grooming with proper set-up  Downgraded to max- total A overall as was pt's PLOF  Functional transfers, neuro re-ed, ADL re-training.   Mobility   max<>total +1-2 assist bed mobility, bed<>w/c transfer with slide board, significant weakness poor endurance, distant supervision in power w/c  mod I power w/c mobility, max assist bed<>w/c, mod assist bed mobility  strengthening, activity tolerance, stretching to LE joints, d/c planning, pt education, balance   Communication             Safety/Cognition/ Behavioral Observations  Supervision   supervision   back to baseline for cognition, d/c from ST services    Pain   no c/o pain  <2  assess q shift and prn   Skin  CDI  skin free from breakdown/infection with min assist  assess q shift and prn    Rehab Goals Patient on target to meet rehab goals: Yes Rehab Goals Revised: none *See Care Plan and progress notes for long and short-term goals.     Barriers to Discharge  Current Status/Progress Possible Resolutions Date Resolved   Physician    Medical stability     Slow  progress towards goals  Continue rehab will likely need SNF      Nursing                  PT  Decreased caregiver support;Lack of/limited family support  son is blind & works during day so pt home alone at baseline, recommending 24 hr assist upon d/c              OT                  SLP                SW                Discharge Planning/Teaching Needs:  Pt/family have decided for pt to go for short term SNF prior to returning home with her son.  Defer teaching to SNF.   Team Discussion:  Pt with polymyositis and is chronically disabled, now here for a stroke with little function in her hand.  Pt is continent and taking whole meds in pudding.  Pt can self feed and groom if she is set up just right.  Other OT tasks are total assistance with +2 for other ADLs.  Pt is total +1 to +2 with PT tasks.  Has poor activity tolerance and is very tight.  [employer] will switch hand control on pt's power w/c after she leaves SNF.  Pt has been discharged from [place].  Revisions to Treatment Plan:  none    Continued Need for Acute Rehabilitation Level of Care: The patient requires daily medical management by a physician with specialized training in physical medicine and rehabilitation for the following conditions: Daily direction of a multidisciplinary physical rehabilitation program to ensure safe treatment while eliciting the highest outcome that is of practical value to the patient.: Yes Daily medical management of patient stability for increased activity during participation in an intensive rehabilitation regime.: Yes Daily analysis of laboratory values and/or radiology reports with any subsequent need for medication adjustment of medical intervention for : Neurological problems;Other  Zharia Conrow, Silvestre Mesi 06/26/2017, 2:35 PM

## 2017-06-26 NOTE — Progress Notes (Signed)
Social Work Patient ID: [name] [name], female   DOB: July 29, 1940, 77 y.o.   MRN: [id number]   CSW met with pt and her dtr via telephone on 06-24-17 to update them on team conference discussion.  Pt's has received one bed offer she wanted, but then dtr wanted CSW to check another SNF.  Pt was still undecided on going to SNF at all, but dtr encouraged her to do so.  Pt was offered a bed at [facility] and was given a private room, so pt and dtr accepted this bed.  CSW began preparations for pt to transfer to SNF.  CSW remains available as needed to assist.

## 2017-06-26 NOTE — Progress Notes (Signed)
Social Work Discharge Note  The overall goal for the admission was met for:   Discharge location:  No - pt required SNF tx due to level of care needed  Length of Stay: Yes - 13 days  Discharge activity level: No - pt was total assist and initial goals were min to mod assist  Home/community participation: No  Services provided included: MD, RD, PT, OT, SLP, RN, Pharmacy, Neuropsych and [place]: Private Insurance: [employer]  Follow-up services arranged: Other: [facility] - SNF  Comments (or additional information):  Pt required more care than what could be provided at home.  Went for short term SNF, but plans to go home as soon as possible.  Pt transferred to [facility] via [facility].  Patient/Family verbalized understanding of follow-up arrangements: Yes  Individual responsible for coordination of the follow-up plan:  Pt and dtr  Confirmed correct DME delivered: Trey Sailors 06/26/2017    Chester Romero, Silvestre Mesi

## 2017-07-20 ENCOUNTER — Encounter: Payer: Medicare Other | Attending: Internal Medicine

## 2017-07-20 ENCOUNTER — Inpatient Hospital Stay: Payer: Medicare Other | Admitting: Physical Medicine & Rehabilitation

## 2017-07-21 ENCOUNTER — Encounter

## 2017-07-21 ENCOUNTER — Encounter (HOSPITAL_COMMUNITY): Payer: Self-pay

## 2017-07-21 ENCOUNTER — Other Ambulatory Visit: Payer: Self-pay

## 2017-07-21 ENCOUNTER — Emergency Department (HOSPITAL_COMMUNITY): Payer: Medicare Other

## 2017-07-21 ENCOUNTER — Observation Stay (HOSPITAL_COMMUNITY): Payer: Medicare Other

## 2017-07-21 ENCOUNTER — Ambulatory Visit: Payer: Medicare Other | Admitting: Gastroenterology

## 2017-07-21 ENCOUNTER — Emergency Department (HOSPITAL_BASED_OUTPATIENT_CLINIC_OR_DEPARTMENT_OTHER): Payer: Medicare Other

## 2017-07-21 ENCOUNTER — Observation Stay (HOSPITAL_COMMUNITY)
Admission: EM | Admit: 2017-07-21 | Discharge: 2017-07-23 | Disposition: A | Discharge status: Skilled Nursing Facility | Payer: Medicare Other | Attending: Internal Medicine | Admitting: Internal Medicine

## 2017-07-21 DIAGNOSIS — Z79899 Other long term (current) drug therapy: Secondary | ICD-10-CM | POA: Insufficient documentation

## 2017-07-21 DIAGNOSIS — R627 Adult failure to thrive: Secondary | ICD-10-CM | POA: Insufficient documentation

## 2017-07-21 DIAGNOSIS — I110 Hypertensive heart disease with heart failure: Secondary | ICD-10-CM | POA: Diagnosis not present

## 2017-07-21 DIAGNOSIS — M79609 Pain in unspecified limb: Secondary | ICD-10-CM

## 2017-07-21 DIAGNOSIS — E785 Hyperlipidemia, unspecified: Secondary | ICD-10-CM | POA: Diagnosis not present

## 2017-07-21 DIAGNOSIS — Z7982 Long term (current) use of aspirin: Secondary | ICD-10-CM | POA: Insufficient documentation

## 2017-07-21 DIAGNOSIS — R531 Weakness: Secondary | ICD-10-CM | POA: Diagnosis not present

## 2017-07-21 DIAGNOSIS — I5032 Chronic diastolic (congestive) heart failure: Secondary | ICD-10-CM | POA: Diagnosis not present

## 2017-07-21 DIAGNOSIS — Z6825 Body mass index (BMI) 25.0-25.9, adult: Secondary | ICD-10-CM | POA: Insufficient documentation

## 2017-07-21 DIAGNOSIS — M3320 Polymyositis, organ involvement unspecified: Secondary | ICD-10-CM | POA: Diagnosis not present

## 2017-07-21 DIAGNOSIS — R918 Other nonspecific abnormal finding of lung field: Secondary | ICD-10-CM | POA: Insufficient documentation

## 2017-07-21 DIAGNOSIS — E876 Hypokalemia: Secondary | ICD-10-CM | POA: Insufficient documentation

## 2017-07-21 DIAGNOSIS — K219 Gastro-esophageal reflux disease without esophagitis: Secondary | ICD-10-CM | POA: Insufficient documentation

## 2017-07-21 DIAGNOSIS — L89309 Pressure ulcer of unspecified buttock, unspecified stage: Secondary | ICD-10-CM

## 2017-07-21 DIAGNOSIS — I639 Cerebral infarction, unspecified: Secondary | ICD-10-CM | POA: Diagnosis present

## 2017-07-21 DIAGNOSIS — Z9181 History of falling: Secondary | ICD-10-CM | POA: Insufficient documentation

## 2017-07-21 DIAGNOSIS — Z66 Do not resuscitate: Secondary | ICD-10-CM | POA: Insufficient documentation

## 2017-07-21 DIAGNOSIS — I6931 Attention and concentration deficit following cerebral infarction: Secondary | ICD-10-CM | POA: Diagnosis not present

## 2017-07-21 DIAGNOSIS — Z9889 Other specified postprocedural states: Secondary | ICD-10-CM | POA: Diagnosis not present

## 2017-07-21 DIAGNOSIS — M810 Age-related osteoporosis without current pathological fracture: Secondary | ICD-10-CM | POA: Insufficient documentation

## 2017-07-21 DIAGNOSIS — I10 Essential (primary) hypertension: Secondary | ICD-10-CM | POA: Diagnosis present

## 2017-07-21 DIAGNOSIS — Z885 Allergy status to narcotic agent status: Secondary | ICD-10-CM | POA: Diagnosis not present

## 2017-07-21 DIAGNOSIS — Z90711 Acquired absence of uterus with remaining cervical stump: Secondary | ICD-10-CM | POA: Insufficient documentation

## 2017-07-21 DIAGNOSIS — M25552 Pain in left hip: Secondary | ICD-10-CM | POA: Diagnosis not present

## 2017-07-21 DIAGNOSIS — R221 Localized swelling, mass and lump, neck: Secondary | ICD-10-CM | POA: Diagnosis present

## 2017-07-21 DIAGNOSIS — E559 Vitamin D deficiency, unspecified: Secondary | ICD-10-CM | POA: Insufficient documentation

## 2017-07-21 DIAGNOSIS — R2689 Other abnormalities of gait and mobility: Secondary | ICD-10-CM | POA: Diagnosis not present

## 2017-07-21 DIAGNOSIS — M6282 Rhabdomyolysis: Secondary | ICD-10-CM

## 2017-07-21 HISTORY — DX: Cerebral infarction, unspecified: I63.9

## 2017-07-21 HISTORY — DX: Chronic diastolic (congestive) heart failure: I50.32

## 2017-07-21 LAB — CBC WITH DIFFERENTIAL/PLATELET
ABS IMMATURE GRANULOCYTES: 0 10*3/uL (ref 0.0–0.1)
BASOS PCT: 1 %
Basophils Absolute: 0.1 10*3/uL (ref 0.0–0.1)
EOS PCT: 7 %
Eosinophils Absolute: 0.4 10*3/uL (ref 0.0–0.7)
HCT: 35.8 % — ABNORMAL LOW (ref 36.0–46.0)
HEMOGLOBIN: 10.9 g/dL — AB (ref 12.0–15.0)
Immature Granulocytes: 0 %
LYMPHS ABS: 2 10*3/uL (ref 0.7–4.0)
LYMPHS PCT: 36 %
MCH: 26.1 pg (ref 26.0–34.0)
MCHC: 30.4 g/dL (ref 30.0–36.0)
MCV: 85.9 fL (ref 78.0–100.0)
MONO ABS: 0.5 10*3/uL (ref 0.1–1.0)
Monocytes Relative: 10 %
Neutro Abs: 2.5 10*3/uL (ref 1.7–7.7)
Neutrophils Relative %: 46 %
PLATELETS: 356 10*3/uL (ref 150–400)
RBC: 4.17 MIL/uL (ref 3.87–5.11)
RDW: 17.6 % — ABNORMAL HIGH (ref 11.5–15.5)
WBC: 5.5 10*3/uL (ref 4.0–10.5)

## 2017-07-21 LAB — BASIC METABOLIC PANEL
Anion gap: 14 (ref 5–15)
BUN: 30 mg/dL — AB (ref 8–23)
CHLORIDE: 105 mmol/L (ref 98–111)
CO2: 24 mmol/L (ref 22–32)
CREATININE: 0.81 mg/dL (ref 0.44–1.00)
Calcium: 9.8 mg/dL (ref 8.9–10.3)
GFR calc non Af Amer: >60 mL/min (ref >60)
Glucose, Bld: 86 mg/dL (ref 70–99)
Potassium: 3.7 mmol/L (ref 3.5–5.1)
SODIUM: 143 mmol/L (ref 135–145)

## 2017-07-21 LAB — CK: CK TOTAL: 2750 U/L — AB (ref 38–234)

## 2017-07-21 MED ORDER — MYCOPHENOLATE MOFETIL 250 MG PO CAPS
250.0000 mg | ORAL_CAPSULE | Freq: Two times a day (BID) | ORAL | Status: DC
  Administered 2017-07-21 – 2017-07-22 (×2): 250 mg via ORAL
  Filled 2017-07-21 (×3): qty 1

## 2017-07-21 MED ORDER — MORPHINE SULFATE (PF) 2 MG/ML IV SOLN
2.0000 mg | INTRAVENOUS | Status: DC | PRN
Start: 2017-07-21 — End: 2017-07-23
  Administered 2017-07-22: 2 mg via INTRAVENOUS
  Filled 2017-07-21: qty 1

## 2017-07-21 MED ORDER — OXYCODONE HCL 5 MG PO TABS
5.0000 mg | ORAL_TABLET | ORAL | Status: DC | PRN
  Administered 2017-07-21 – 2017-07-22 (×4): 5 mg via ORAL
  Filled 2017-07-21 (×4): qty 1

## 2017-07-21 MED ORDER — METHYLPREDNISOLONE SODIUM SUCC 125 MG IJ SOLR
80.0000 mg | Freq: Two times a day (BID) | INTRAMUSCULAR | Status: DC
  Administered 2017-07-21 – 2017-07-23 (×5): 80 mg via INTRAVENOUS
  Filled 2017-07-21 (×5): qty 2

## 2017-07-21 MED ORDER — LACTATED RINGERS IV SOLN
INTRAVENOUS | Status: AC
Start: 2017-07-21 — End: 2017-07-22
  Administered 2017-07-21: 18:00:00 via INTRAVENOUS

## 2017-07-21 MED ORDER — ATENOLOL 50 MG PO TABS
50.0000 mg | ORAL_TABLET | Freq: Every day | ORAL | Status: DC
  Administered 2017-07-22 – 2017-07-23 (×2): 50 mg via ORAL
  Filled 2017-07-21 (×2): qty 1

## 2017-07-21 MED ORDER — DOCUSATE SODIUM 100 MG PO CAPS
100.0000 mg | ORAL_CAPSULE | Freq: Two times a day (BID) | ORAL | Status: DC
  Administered 2017-07-22 – 2017-07-23 (×3): 100 mg via ORAL
  Filled 2017-07-21 (×5): qty 1

## 2017-07-21 MED ORDER — ACETAMINOPHEN 325 MG PO TABS
650.0000 mg | ORAL_TABLET | Freq: Four times a day (QID) | ORAL | Status: DC | PRN
  Administered 2017-07-21: 650 mg via ORAL
  Filled 2017-07-21: qty 2

## 2017-07-21 MED ORDER — ASPIRIN 81 MG PO CHEW
81.0000 mg | CHEWABLE_TABLET | Freq: Every day | ORAL | Status: DC
  Administered 2017-07-22 – 2017-07-23 (×2): 81 mg via ORAL
  Filled 2017-07-21 (×2): qty 1

## 2017-07-21 MED ORDER — ENSURE ENLIVE PO LIQD
237.0000 mL | Freq: Two times a day (BID) | ORAL | Status: DC
  Administered 2017-07-22: 237 mL via ORAL

## 2017-07-21 MED ORDER — ACETAMINOPHEN 650 MG RE SUPP: 650.0000 mg | Freq: Four times a day (QID) | RECTAL | Status: DC | PRN

## 2017-07-21 MED ORDER — FENTANYL CITRATE (PF) 100 MCG/2ML IJ SOLN
50.0000 ug | Freq: Once | INTRAMUSCULAR | Status: AC
Start: 2017-07-21 — End: 2017-07-21
  Administered 2017-07-21: 50 ug via INTRAVENOUS
  Filled 2017-07-21: qty 2

## 2017-07-21 MED ORDER — SENNOSIDES-DOCUSATE SODIUM 8.6-50 MG PO TABS: 2.0000 | ORAL_TABLET | Freq: Every evening | ORAL | Status: DC | PRN

## 2017-07-21 MED ORDER — FOLIC ACID 1 MG PO TABS
500.0000 ug | ORAL_TABLET | Freq: Every day | ORAL | Status: DC
  Administered 2017-07-22 – 2017-07-23 (×2): 0.5 mg via ORAL
  Filled 2017-07-21 (×3): qty 1

## 2017-07-21 MED ORDER — ONDANSETRON HCL 4 MG PO TABS: 4.0000 mg | ORAL_TABLET | Freq: Four times a day (QID) | ORAL | Status: DC | PRN

## 2017-07-21 MED ORDER — SODIUM CHLORIDE 0.9 % IV SOLN
INTRAVENOUS | Status: DC
  Administered 2017-07-21: 12:00:00 via INTRAVENOUS
  Administered 2017-07-22: 1000 mL via INTRAVENOUS
  Administered 2017-07-22 – 2017-07-23 (×2): via INTRAVENOUS

## 2017-07-21 MED ORDER — GABAPENTIN 100 MG PO CAPS
100.0000 mg | ORAL_CAPSULE | Freq: Two times a day (BID) | ORAL | Status: DC
  Administered 2017-07-21 – 2017-07-23 (×4): 100 mg via ORAL
  Filled 2017-07-21 (×4): qty 1

## 2017-07-21 MED ORDER — ATORVASTATIN CALCIUM 10 MG PO TABS
10.0000 mg | ORAL_TABLET | Freq: Every day | ORAL | Status: DC
  Administered 2017-07-21 – 2017-07-22 (×2): 10 mg via ORAL
  Filled 2017-07-21 (×2): qty 1

## 2017-07-21 MED ORDER — FEBUXOSTAT 40 MG PO TABS
40.0000 mg | ORAL_TABLET | Freq: Every day | ORAL | Status: DC
  Administered 2017-07-22 – 2017-07-23 (×2): 40 mg via ORAL
  Filled 2017-07-21 (×2): qty 1

## 2017-07-21 MED ORDER — ONDANSETRON HCL 4 MG/2ML IJ SOLN: 4.0000 mg | Freq: Four times a day (QID) | INTRAMUSCULAR | Status: DC | PRN

## 2017-07-21 MED ORDER — GABAPENTIN 100 MG PO CAPS
100.0000 mg | ORAL_CAPSULE | Freq: Once | ORAL | Status: AC
  Administered 2017-07-21: 100 mg via ORAL
  Filled 2017-07-21: qty 1

## 2017-07-21 MED ORDER — ALPRAZOLAM 0.5 MG PO TABS
0.5000 mg | ORAL_TABLET | Freq: Two times a day (BID) | ORAL | Status: DC | PRN
Start: 2017-07-21 — End: 2017-07-23

## 2017-07-21 MED ORDER — ENOXAPARIN SODIUM 40 MG/0.4ML ~~LOC~~ SOLN
40.0000 mg | SUBCUTANEOUS | Status: DC
  Administered 2017-07-21 – 2017-07-22 (×2): 40 mg via SUBCUTANEOUS
  Filled 2017-07-21 (×2): qty 0.4

## 2017-07-21 MED ORDER — PANTOPRAZOLE SODIUM 40 MG PO TBEC
40.0000 mg | DELAYED_RELEASE_TABLET | Freq: Every day | ORAL | Status: DC
  Administered 2017-07-22 – 2017-07-23 (×2): 40 mg via ORAL
  Filled 2017-07-21 (×2): qty 1

## 2017-07-21 NOTE — ED Notes (Signed)
Pt moved to room 16 to be cleaned, changed, and port accessed.

## 2017-07-21 NOTE — Progress Notes (Signed)
OT Cancellation Note  Patient Details Name: [name] [name] MRN: [id number] DOB: Sep 10, 1940   Cancelled Treatment:    Reason Eval/Treat Not Completed: Patient at procedure or test/ unavailable. Pt out of room on my arrival. Will check back as able for OT evaluation. Note imminent discharge OT order.   Norman Herrlich, MS OTR/L  Pager: [phone]   Norman Herrlich 07/21/2017, 3:58 PM

## 2017-07-21 NOTE — Progress Notes (Signed)
*  Preliminary Results* Left lower extremity venous duplex completed. Left lower extremity is negative for deep vein thrombosis. There is no evidence of left Baker's cyst.  07/21/2017 10:04 AM  Maudry Mayhew, BS, RVT, RDCS, RDMS

## 2017-07-21 NOTE — ED Notes (Signed)
MD at bedside. 

## 2017-07-21 NOTE — Evaluation (Signed)
Physical Therapy Evaluation Patient Details Name: [name] [name] MRN: [id number] DOB: Apr 13, 1940 Today's Date: 07/21/2017   History of Present Illness  77 y.o. female with PMH of hypertension, polymyositis, diastolic CHF, colonic AVM, was admitted to the [facility] emergency room on 06/09/2017 with complaints of generalized numbness and weakness that started around 5 AM on the morning of 06/09/2017.  Imaging revealed mutilple scattered left MCA territory strokes. Went to [facility] and then [facility] due to still not being mobile. Went home with daughter and return today with B hip and UE pain  Clinical Impression  Pt admitted with above diagnosis. Pt currently with functional limitations due to the deficits listed below (see PT Problem List). PT eval limited by pt pain. Attempted bed mobility with intent to sit EOB but pt pain was too much once mobility began that it was terminated and returned to supported partial SL position. Question cognitive status as pt asked same question repeatedly throughout session. Not recommending pt return home at this point.  Pt will benefit from skilled PT to increase their independence and safety with mobility to allow discharge to the venue listed below.      Follow Up Recommendations SNF;Supervision/Assistance - 24 hour    Equipment Recommendations  None recommended by PT    Recommendations for Other Services       Precautions / Restrictions Precautions Precautions: Fall Restrictions Weight Bearing Restrictions: No      Mobility  Bed Mobility Overal bed mobility: Needs Assistance Bed Mobility: Rolling Rolling: Max assist         General bed mobility comments: attempted rolling with goal of getting to EOB but pt could not tolerate today due to pain  Transfers                 General transfer comment: unable  Ambulation/Gait             General Gait Details: unable  Stairs            Wheelchair Mobility    Modified Rankin  (Stroke Patients Only)       Balance                                             Pertinent Vitals/Pain Pain Assessment: Faces Faces Pain Scale: Hurts worst Pain Location: B hips and UE's Pain Descriptors / Indicators: Grimacing;Aching Pain Intervention(s): Limited activity within patient's tolerance    Home Living Family/patient expects to be discharged to:: Private residence Living Arrangements: Children Available Help at Discharge: Family;Available 24 hours/day Type of Home: Apartment Home Access: Level entry     Home Layout: One level Home Equipment: Wheelchair - power;Adaptive equipment;Grab bars - toilet Additional Comments: Accessible via power chair    Prior Function Level of Independence: Needs assistance   Gait / Transfers Assistance Needed: pt reports most recently she has been using bed pan for toileting but has been getting in w/c, question her history though as she kept asking same question multiple times  ADL's / Homemaking Assistance Needed: sponge bathes with family set up        Hand Dominance   Dominant Hand: Right    Extremity/Trunk Assessment   Upper Extremity Assessment Upper Extremity Assessment: Generalized weakness    Lower Extremity Assessment Lower Extremity Assessment: Generalized weakness;RLE deficits/detail RLE Deficits / Details: unable to lift either LE against gravity  due to pain RLE: Unable to fully assess due to pain    Cervical / Trunk Assessment Cervical / Trunk Assessment: Normal  Communication   Communication: No difficulties  Cognition Arousal/Alertness: Awake/alert Behavior During Therapy: Restless Overall Cognitive Status: No family/caregiver present to determine baseline cognitive functioning                                 General Comments: pt kept asking where she could go from here and when that would be, questioned answered numerous times but she kept repeating      General  Comments General comments (skin integrity, edema, etc.): pt unable to tolerate mobility due to pain    Exercises     Assessment/Plan    PT Assessment Patient needs continued PT services  PT Problem List Decreased strength;Decreased activity tolerance;Decreased balance;Decreased mobility;Decreased cognition;Decreased knowledge of precautions;Pain       PT Treatment Interventions Functional mobility training;Therapeutic activities;Therapeutic exercise;Balance training;Patient/family education;Neuromuscular re-education    PT Goals (Current goals can be found in the Care Plan section)  Acute Rehab PT Goals Patient Stated Goal: go back for rehab PT Goal Formulation: With patient Time For Goal Achievement: 08/04/17 Potential to Achieve Goals: Fair    Frequency Min 2X/week   Barriers to discharge Decreased caregiver support family unable to care for pt at home in current state    Co-evaluation               AM-PAC PT "6 Clicks" Daily Activity  Outcome Measure Difficulty turning over in bed (including adjusting bedclothes, sheets and blankets)?: Unable Difficulty moving from lying on back to sitting on the side of the bed? : Unable Difficulty sitting down on and standing up from a chair with arms (e.g., wheelchair, bedside commode, etc,.)?: Unable Help needed moving to and from a bed to chair (including a wheelchair)?: Total Help needed walking in hospital room?: Total Help needed climbing 3-5 steps with a railing? : Total 6 Click Score: 6    End of Session   Activity Tolerance: Patient limited by pain Patient left: in bed;with call bell/phone within reach Nurse Communication: Mobility status PT Visit Diagnosis: Muscle weakness (generalized) (M62.81);Pain Pain - part of body: Hip    Time: 6979-4801 PT Time Calculation (min) (ACUTE ONLY): 9 min   Charges:   PT Evaluation $PT Eval Low Complexity: 1 Low     PT G Codes:        Leighton Roach, PT  Acute Rehab  Services  [place] 07/21/2017, 4:51 PM

## 2017-07-21 NOTE — ED Notes (Signed)
Pt transported to vascular.  °

## 2017-07-21 NOTE — ED Provider Notes (Signed)
[facility] EMERGENCY DEPARTMENT Provider Note   CSN: [id number] Arrival date & time: 07/21/17  0845     History   Chief Complaint Chief Complaint  Patient presents with  . Hip Pain    HPI [name] [name] is a 77 y.o. female.  HPI Patient presents with left-sided thigh/hip pain.  Reportedly began last night.  Worse with movement.  States she has not had her Neurontin.  No trauma.  Has had previous strokes with some deficits on both sides.  No fevers.  No new weakness.  Pain radiates from the left hip down the leg. Past Medical History:  Diagnosis Date  . Allergic rhinitis, cause unspecified   . Benign neoplasm of colon   . Esophageal reflux   . Excessive daytime sleepiness   . 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 GI hemorrhage 2011   recurrent  . Hyperlipidemia   . Osteoporosis   . Polymyositis ([place])   . Unspecified essential hypertension   . Vitamin D deficiency     Patient Active Problem List   Diagnosis Date Noted  . Neuropathic pain   . 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 Neurologic deficit due to acute ischemic stroke ([place])   . Hypokalemia   . Chronic diastolic congestive heart failure ([place])   . AVM (arteriovenous malformation)   . CVA (cerebral vascular accident) ([place]) 06/09/2017  . Multiple pulmonary nodules 06/09/2017  . Hypothermia 10/22/2016  . Chronic diastolic CHF (congestive heart failure) ([place]) 10/22/2016  . Abdominal pain, unspecified site 05/17/2013  . Nausea alone 05/17/2013  . 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 Abdominal pain, left lower quadrant 05/28/2011  . Cecal ulcer with hemorrhage 01/10/2011  . Acute blood loss anemia 12/06/2010  . Hematochezia 12/05/2010  . Anemia 12/05/2010  . Adrenal insufficiency ([place]) 12/05/2010  . Osteoporosis 12/05/2010  . LUNG INVOLVEMENT OTHER DISEASES CLASSIFIED ELSW 08/13/2009  . OTHER SPECIFIED DISORDER OF STOMACH AND  DUODENUM 07/06/2008  . Essential hypertension 04/26/2007  . ALLERGIC RHINITIS 04/26/2007  . GASTROESOPHAGEAL REFLUX DISEASE 04/26/2007  . Polymyositis ([place]) 04/26/2007  . COLONIC POLYPS, ADENOMATOUS 02/06/2005    Past Surgical History:  Procedure Laterality Date  . ABDOMINAL HYSTERECTOMY     partial  . bilateral  rotator cuff surgery    . COLONOSCOPY  01/09/2011   others also  . COLONOSCOPY  05/29/2011   Procedure: COLONOSCOPY;  Surgeon: Gatha Mayer, MD;  Location: [facility] ENDOSCOPY;  Service: Endoscopy;  Laterality: N/A;  Greggory Brandy Carlean Purl  . COLONOSCOPY WITH PROPOFOL N/A 02/16/2014   Procedure: COLONOSCOPY WITH PROPOFOL;  Surgeon: Milus Banister, MD;  Location: [facility] ENDOSCOPY;  Service: Endoscopy;  Laterality: N/A;  . ESOPHAGOGASTRODUODENOSCOPY  12/08/2010   others also  . ESOPHAGOGASTRODUODENOSCOPY (EGD) WITH PROPOFOL N/A 02/16/2014   Procedure: ESOPHAGOGASTRODUODENOSCOPY (EGD) WITH PROPOFOL;  Surgeon: Milus Banister, MD;  Location: [facility] ENDOSCOPY;  Service: Endoscopy;  Laterality: N/A;  . EUS    . HOT HEMOSTASIS  05/29/2011   Procedure: HOT HEMOSTASIS (ARGON PLASMA COAGULATION/BICAP);  Surgeon: Gatha Mayer, MD;  Location: [facility];  Service: Endoscopy;  Laterality: N/A;  . LUMBAR LAMINECTOMY    . TONSILLECTOMY  age 27     OB History   None      Home Medications    Prior to Admission medications   Medication Sig Start Date End Date Taking? Authorizing Provider  acetaminophen (TYLENOL 8 HOUR) 650 MG CR tablet Take 1 tablet (  650 mg total) by mouth every 8 (eight) hours as needed. Patient taking differently: Take 650 mg by mouth every 8 (eight) hours as needed for pain.  05/15/17   Varney Biles, MD  ALPRAZolam Duanne Moron) 0.5 MG tablet Take 1 tablet (0.5 mg total) by mouth 2 (two) times daily as needed for anxiety or sleep. 06/24/17   Love, Ivan Anchors, PA-C  Ascorbic Acid (VITAMIN C) 1000 MG tablet Take 1,000 mg by mouth daily.     [provider]  aspirin 81 MG chewable tablet  Chew 1 tablet (81 mg total) by mouth daily. 06/12/17   Caren Griffins, MD  atenolol (TENORMIN) 50 MG tablet Take 50 mg by mouth daily.      [provider]  atorvastatin (LIPITOR) 10 MG tablet Take 1 tablet (10 mg total) by mouth daily at 6 PM. 06/12/17   Gherghe, Vella Redhead, MD  Calcium Carbonate (CALTRATE 600) 1500 MG TABS Take 2 tablets by mouth daily.     [provider]  fluticasone (FLONASE) 50 MCG/ACT nasal spray Place 1 spray into both nostrils daily as needed for allergies.  06/05/17   [provider]  folic acid (FOLVITE) 284 MCG tablet Take 400 mcg by mouth daily.      [provider]  furosemide (LASIX) 40 MG tablet Take 40 mg by mouth daily.      [provider]  gabapentin (NEURONTIN) 100 MG capsule TAKE ONE CAPSULE BY MOUTH TWICE DAILY Patient taking differently: TAKE ONE CAPSULE BY MOUTH TWICE DAILY AS NEEDED FOR NERVE PAIN 04/14/17   Wallene Huh, DPM  mycophenolate (CELLCEPT) 250 MG capsule Take 2 capsules (500 mg total) by mouth 2 (two) times daily. 06/24/17   Love, Ivan Anchors, PA-C  pantoprazole (PROTONIX) 40 MG tablet Take 1 tablet (40 mg total) by mouth daily. 06/25/17   Love, Ivan Anchors, PA-C  potassium chloride SA (K-DUR,KLOR-CON) 20 MEQ tablet Take 2 tablets (40 mEq total) by mouth daily. 06/25/17   Love, Ivan Anchors, PA-C  senna-docusate (SENOKOT-S) 8.6-50 MG tablet Take 2 tablets by mouth at bedtime as needed for mild constipation. 06/24/17   Love, Ivan Anchors, PA-C  ULORIC 40 MG tablet Take 40 mg by mouth daily. 06/20/16   [provider]  vitamin B-12 (CYANOCOBALAMIN) 1000 MCG tablet Take 1,000 mcg by mouth daily.      [provider]    Family History Family History  Problem Relation Age of Onset  . Dementia Mother   . Hypertension Father   . Malignant hyperthermia Father   . Colon cancer Neg Hx     Social History Social History   Tobacco Use  . Smoking status: Never Smoker  . Smokeless tobacco: Former Systems developer     Types: Snuff  . Tobacco comment: Tobacco info given 05/11/13  Substance Use Topics  . Alcohol use: No  . Drug use: No     Allergies   Codeine; Hydrocodone; and Tramadol hcl   Review of Systems Review of Systems  Constitutional: Negative for appetite change and fever.  Respiratory: Negative for shortness of breath.   Gastrointestinal: Negative for abdominal pain.  Genitourinary: Negative for flank pain.  Musculoskeletal: Negative for back pain.       Left hip and lower extremity pain.  Skin: Negative for wound.  Neurological: Positive for weakness.  Psychiatric/Behavioral: Negative for confusion.     Physical Exam Updated Vital Signs BP 125/61 (BP Location: Right Arm)   Pulse 98   Temp  98.3 F (36.8 C) (Oral)   Resp 18   Ht 5\' 5"  (1.651 m)   Wt 64.9 kg (143 lb)   SpO2 98%   BMI 23.80 kg/m   Physical Exam  Constitutional: She appears well-developed.  HENT:  Head: Normocephalic.  Neck: Neck supple.  Cardiovascular: Normal rate.  Abdominal: There is no tenderness.  Musculoskeletal: She exhibits edema.  Some edema left lower extremity.  Tender over left thigh.  Muscle tone on left compared to light.  Does have clonus on left ankle.  No pain of lower back.  Good range of motion in left hip and left knee.  Neurological: She is alert.  Skin: Skin is warm.     ED Treatments / Results  Labs (all labs ordered are listed, but only abnormal results are displayed) Labs Reviewed  CBC WITH DIFFERENTIAL/PLATELET - Abnormal; Notable for the following components:      Result Value   Hemoglobin 10.9 (*)    HCT 35.8 (*)    RDW 17.6 (*)    All other components within normal limits  BASIC METABOLIC PANEL - Abnormal; Notable for the following components:   BUN 30 (*)    All other components within normal limits  CK - Abnormal; Notable for the following components:   Total CK 2,750 (*)    All other components within normal limits    EKG None  Radiology Dg Hip Unilat W  Or Wo Pelvis 2-3 Views Left  Result Date: 07/21/2017 CLINICAL DATA:  Left hip pain with no known injury EXAM: DG HIP (WITH OR WITHOUT PELVIS) 2-3V LEFT COMPARISON:  Coronal and sagittal reconstructed images through the pelvis and left hip from an abdominal and pelvic CT scan of May 15, 2017. FINDINGS: The bones are subjectively osteopenic. There is no lytic or blastic lesion of the pelvis. There is no acute fracture. AP and lateral views of the left hip reveal preservation of the joint space. The articular surface of the femoral head and acetabulum remains smoothly rounded. The femoral neck, intertrochanteric, and subtrochanteric regions are normal. IMPRESSION: There is no acute or significant chronic bony abnormality of the left hip. Electronically Signed   By: David  Martinique M.D.   On: 07/21/2017 09:41    Procedures Procedures (including critical care time)  Medications Ordered in ED Medications  fentaNYL (SUBLIMAZE) injection 50 mcg (has no administration in time range)  gabapentin (NEURONTIN) capsule 100 mg (100 mg Oral Given 07/21/17 0914)     Initial Impression / Assessment and Plan / ED Course  I have reviewed the triage vital signs and the nursing notes.  Pertinent labs & imaging results that were available during my care of the patient were reviewed by me and considered in my medical decision making (see chart for details).     Patient presents with left hip pain.  Is been worse for last few days.  Note been able to discuss with daughter.  She has a history of polymyositis.  Has been taking Neurontin for the pain.  However has not really been able to manage at home.  She had been in nursing home up until around a week ago and now is at her daughter's house.  Daughter is having a difficult time taking care of her.  Patient's CK is increased to above 2000.  With the worsening pain and increased CK I feel patient would benefit from admission in the hospital for further pain control fluid  hydration and potentially placement or organization of help  at home.  Will discuss with hospitalist.  Final Clinical Impressions(s) / ED Diagnoses   Final diagnoses:  Polymyositis ([place])  Non-traumatic rhabdomyolysis  Pain of left hip joint    ED Discharge Orders    None       Davonna Belling, MD 07/21/17 1043

## 2017-07-21 NOTE — ED Notes (Signed)
Please call daughter, [name] at [phone] when patient is moved upstairs.

## 2017-07-21 NOTE — ED Triage Notes (Signed)
Pt brought in by EMS for C/o left hip/leg pain that began last ngith  ; pt denies any trauma to the  Area ; hx of stroke with both left and right sided weakness ; NO NARCOTICS Per daughter

## 2017-07-21 NOTE — H&P (Signed)
History and Physical    [name] [name]:[id number] DOB: 08-09-1940 DOA: 07/21/2017  PCP: [name], MD Consultants:  Amil Amen - rheumatology; Edison Nasuti - GI; ortho Patient coming from:  Home - lives with daughter, currently, but the daughter is not able to care for her; NOK: Daughter, [phone]  Chief Complaint:  Hip pain  HPI: [name] [name] is a 77 y.o. female with medical history significant of polymyositis; HLD; and gastric leiomyoma presenting with hip pain.  She was hospitalized for a stroke and discharged to CIR on 6/19.  She was then discharged to [facility] for SNF rehab until 7.3  At that time, she went home with her daughter.   This morning, she developed abdominal pain without N/V/D.  Her daughter gave her Imodium but she did not improve.  She did have a normal BM yesterday.  She then developed leg pain this AM.  The abdominal pain has resolved but now her legs are still bothering her.  The pain starts in her groin and radiates down her B thighs.  Her daughter reports that her mother is requiring total care and they are not able to be successful at home.  The patient can't move herself like she did before.  They believe she will need ongoing SNF placement.   ED Course:   Polymyositis causing pain.  Elevated CK.  May also need placement.  On cellcept, has been on steroids in the past.  Likely obs.  Review of Systems: As per HPI; otherwise review of systems reviewed and negative.   Ambulatory Status:  Currently nonambulatory   Past Medical History:  Diagnosis Date  . Allergic rhinitis, cause unspecified   . Benign neoplasm of colon   . Chronic diastolic CHF (congestive heart failure) ([place])   . CVA (cerebral vascular accident) ([place]) 05/2017  . Esophageal reflux   . Excessive daytime sleepiness   . 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 GI hemorrhage 2011   recurrent  . Hyperlipidemia   . Osteoporosis   . Polymyositis ([place])   . Unspecified essential  hypertension   . Vitamin D deficiency     Past Surgical History:  Procedure Laterality Date  . ABDOMINAL HYSTERECTOMY     partial  . bilateral  rotator cuff surgery    . COLONOSCOPY  01/09/2011   others also  . COLONOSCOPY  05/29/2011   Procedure: COLONOSCOPY;  Surgeon: Gatha Mayer, MD;  [facility];  Service: Endoscopy;  Laterality: N/A;  Greggory Brandy Carlean Purl  . COLONOSCOPY WITH PROPOFOL N/A 02/16/2014   Procedure: COLONOSCOPY WITH PROPOFOL;  Surgeon: Milus Banister, MD;  [facility];  Service: Endoscopy;  Laterality: N/A;  . ESOPHAGOGASTRODUODENOSCOPY  12/08/2010   others also  . ESOPHAGOGASTRODUODENOSCOPY (EGD) WITH PROPOFOL N/A 02/16/2014   Procedure: ESOPHAGOGASTRODUODENOSCOPY (EGD) WITH PROPOFOL;  Surgeon: Milus Banister, MD;  [facility];  Service: Endoscopy;  Laterality: N/A;  . EUS    . HOT HEMOSTASIS  05/29/2011   Procedure: HOT HEMOSTASIS (ARGON PLASMA COAGULATION/BICAP);  Surgeon: Gatha Mayer, MD;  Location: [facility];  Service: Endoscopy;  Laterality: N/A;  . LUMBAR LAMINECTOMY    . TONSILLECTOMY  age 31    Social History   Socioeconomic History  . Marital status: Divorced    Spouse name: Not on file  . Number of children: 5  . Years of education: 11th grade  . Highest education level: Not on file  Occupational History  . Occupation: retired  Social Needs  . Financial resource strain: Not on file  . Food insecurity:    Worry: Not on file    Inability: Not on file  . Transportation needs:    Medical: Not on file    Non-medical: Not on file  Tobacco Use  . Smoking status: Never Smoker  . Smokeless tobacco: Former Systems developer    Types: Snuff  . Tobacco comment: Tobacco info given 05/11/13  Substance and Sexual Activity  . Alcohol use: No  . Drug use: No  . Sexual activity: Never  Lifestyle  . Physical activity:    Days per week: Not on file    Minutes per session: Not on file  . Stress: Not on file  Relationships  . Social  connections:    Talks on phone: Not on file    Gets together: Not on file    Attends religious service: Not on file    Active member of club or organization: Not on file    Attends meetings of clubs or organizations: Not on file    Relationship status: Not on file  . Intimate partner violence:    Fear of current or ex partner: Not on file    Emotionally abused: Not on file    Physically abused: Not on file    Forced sexual activity: Not on file  Other Topics Concern  . Not on file  Social History Narrative   Lives at home with her son.   Right-handed.   1 cup caffeine per day.    Allergies  Allergen Reactions  . Codeine Other (See Comments)    Feels drunk  . Hydrocodone     "makes me sleepy"  . Tramadol Hcl Other (See Comments)    Makes me feel drunk    Family History  Problem Relation Age of Onset  . Dementia Mother   . Hypertension Father   . Malignant hyperthermia Father   . Colon cancer Neg Hx     Prior to Admission medications   Medication Sig Start Date End Date Taking? Authorizing Provider  acetaminophen (TYLENOL 8 HOUR) 650 MG CR tablet Take 1 tablet (650 mg total) by mouth every 8 (eight) hours as needed. Patient taking differently: Take 650 mg by mouth every 8 (eight) hours as needed for pain.  05/15/17   Varney Biles, MD  ALPRAZolam Duanne Moron) 0.5 MG tablet Take 1 tablet (0.5 mg total) by mouth 2 (two) times daily as needed for anxiety or sleep. 06/24/17   Love, Ivan Anchors, PA-C  Ascorbic Acid (VITAMIN C) 1000 MG tablet Take 1,000 mg by mouth daily.     [provider]  aspirin 81 MG chewable tablet Chew 1 tablet (81 mg total) by mouth daily. 06/12/17   Caren Griffins, MD  atenolol (TENORMIN) 50 MG tablet Take 50 mg by mouth daily.      [provider]  atorvastatin (LIPITOR) 10 MG tablet Take 1 tablet (10 mg total) by mouth daily at 6 PM. 06/12/17   Gherghe, Vella Redhead, MD  Calcium Carbonate (CALTRATE 600) 1500 MG TABS Take 2 tablets by mouth  daily.     [provider]  fluticasone (FLONASE) 50 MCG/ACT nasal spray Place 1 spray into both nostrils daily as needed for allergies.  06/05/17   [provider]  folic acid (FOLVITE) 619 MCG tablet Take 400 mcg by mouth daily.      [provider]  furosemide (LASIX) 40 MG tablet Take 40 mg by mouth  daily.      [provider]  gabapentin (NEURONTIN) 100 MG capsule TAKE ONE CAPSULE BY MOUTH TWICE DAILY Patient taking differently: TAKE ONE CAPSULE BY MOUTH TWICE DAILY AS NEEDED FOR NERVE PAIN 04/14/17   Wallene Huh, DPM  mycophenolate (CELLCEPT) 250 MG capsule Take 2 capsules (500 mg total) by mouth 2 (two) times daily. 06/24/17   Love, Ivan Anchors, PA-C  pantoprazole (PROTONIX) 40 MG tablet Take 1 tablet (40 mg total) by mouth daily. 06/25/17   Love, Ivan Anchors, PA-C  potassium chloride SA (K-DUR,KLOR-CON) 20 MEQ tablet Take 2 tablets (40 mEq total) by mouth daily. 06/25/17   Love, Ivan Anchors, PA-C  senna-docusate (SENOKOT-S) 8.6-50 MG tablet Take 2 tablets by mouth at bedtime as needed for mild constipation. 06/24/17   Love, Ivan Anchors, PA-C  ULORIC 40 MG tablet Take 40 mg by mouth daily. 06/20/16   [provider]  vitamin B-12 (CYANOCOBALAMIN) 1000 MCG tablet Take 1,000 mcg by mouth daily.      [provider]    Physical Exam: Vitals:   07/21/17 0845 07/21/17 0858  BP: 125/61   Pulse: 98   Resp: 18   Temp: 98.3 F (36.8 C)   TempSrc: Oral   SpO2: 98%   Weight:  64.9 kg (143 lb)  Height:  5\' 5"  (1.651 m)     General:  Appears debilitated and chronically ill Eyes:   EOMI, normal lids, iris ENT:  grossly normal hearing, lips & tongue, mildly dry mm Neck:  no LAD, masses or thyromegaly.  There is a subtle fullness in the area of the sternal notch. Cardiovascular:  RRR, no m/r/g. No LE edema.  Respiratory:   CTA bilaterally with no wheezes/rales/rhonchi.  Normal respiratory effort. Abdomen:  soft, NT, ND, NABS Skin:  no rash or induration  seen on limited exam Musculoskeletal: atrophic extremities with marked decrease in generalized strength, 2/5; there does not appear to be any specific focal deficit Lower extremity:  No LE edema.  Limited foot exam with no ulcerations.  2+ distal pulses. Psychiatric: blunted mood and affect, speech slowed but appropriate, AOx3 Neurologic: unable to perform   Radiological Exams on Admission: Dg Hip Unilat W Or Wo Pelvis 2-3 Views Left  Result Date: 07/21/2017 CLINICAL DATA:  Left hip pain with no known injury EXAM: DG HIP (WITH OR WITHOUT PELVIS) 2-3V LEFT COMPARISON:  Coronal and sagittal reconstructed images through the pelvis and left hip from an abdominal and pelvic CT scan of May 15, 2017. FINDINGS: The bones are subjectively osteopenic. There is no lytic or blastic lesion of the pelvis. There is no acute fracture. AP and lateral views of the left hip reveal preservation of the joint space. The articular surface of the femoral head and acetabulum remains smoothly rounded. The femoral neck, intertrochanteric, and subtrochanteric regions are normal. IMPRESSION: There is no acute or significant chronic bony abnormality of the left hip. Electronically Signed   By: David  [place] M.D.   On: 07/21/2017 09:41    EKG: not done   Labs on Admission: I have personally reviewed the available labs and imaging studies at the time of the admission.  Pertinent labs:   BUN 30, prior 21 on 6/10 CK 2750; 581 in 5/19 but as high as 2157 in 3/17 WBC 5.5 Hgb 10.9  Assessment/Plan Principal Problem:   Polymyositis ([place]) Active Problems:   Essential hypertension   Chronic diastolic CHF (congestive heart failure) ([place])   CVA (cerebral vascular accident) ([place])  Multiple pulmonary nodules   Neck mass   Polymyositis -Patient with long-standing h/o polymyositis -She takes Cellcept for this  -However, her current CK level is quite elevated -This may be contributing to her overall weakness and  debility -For now, will add Solumedrol 80 mg IV BID to try to decrease the inflammatory process -It is not clear how much this is contributing to her overall poor conditioning -Will provide gentle IVF hydration to try to help clear the CK, as well  Recent CVA -She was sent from CIR to SNF rehab because she continued to require total assist -She was sent to SNF but had only a limited number of days available per insurance -She has failed at home and continues to require total assist - which family is unable to provide -Will consult SW for consideration of placement -Will consult PT/OT/ST for ongoing evaluation -She already has a 3-day qualifying inpatient stay during the last 90 days  HTN -Continue Atenolol  Chronic diastolic CHF -Preserved EF, grade 1 diastolic dysfunction on 5/05 -Appears to be compensated at this time  Pulmonary nodules -No h/o tobacco use -Suggest repeat non-contrast CT 3-6 months from 06/09/17; if stable, it would be reasonable to f/u in 2-4 years  Neck mass -Palpable fullness along the region of the sternal notch -It appears to be too low for goiter -Thymoma is a consideration -She had a recent head and neck CTA without apparent comment on this issue -Will obtain neck US  DVT prophylaxis:  Lovenox  Code Status: DNR - confirmed with patient/family Family Communication: Daughter present throughout hospitalization  Disposition Plan:  Needs SNF Consults called: SW/PT/OT/ST Admission status: It is my clinical opinion that referral for OBSERVATION is reasonable and necessary in this patient based on the above information provided. The aforementioned taken together are felt to place the patient at high risk for further clinical deterioration. However it is anticipated that the patient may be medically stable for discharge from the hospital within 24 to 48 hours.    Karmen Bongo MD [facility]  If note is complete, please contact covering daytime or  nighttime physician. [email or web address] Password [email or web address]  07/21/2017, 1:23 PM

## 2017-07-22 DIAGNOSIS — L89309 Pressure ulcer of unspecified buttock, unspecified stage: Secondary | ICD-10-CM

## 2017-07-22 DIAGNOSIS — M3320 Polymyositis, organ involvement unspecified: Secondary | ICD-10-CM

## 2017-07-22 LAB — BASIC METABOLIC PANEL
ANION GAP: 14 (ref 5–15)
BUN: 21 mg/dL (ref 8–23)
CALCIUM: 9.4 mg/dL (ref 8.9–10.3)
CO2: 23 mmol/L (ref 22–32)
Chloride: 105 mmol/L (ref 98–111)
Creatinine, Ser: 0.61 mg/dL (ref 0.44–1.00)
GFR calc Af Amer: >60 mL/min (ref >60)
GFR calc non Af Amer: >60 mL/min (ref >60)
GLUCOSE: 135 mg/dL — AB (ref 70–99)
Potassium: 4 mmol/L (ref 3.5–5.1)
Sodium: 142 mmol/L (ref 135–145)

## 2017-07-22 LAB — CBC
HCT: 33.7 % — ABNORMAL LOW (ref 36.0–46.0)
HEMOGLOBIN: 10.5 g/dL — AB (ref 12.0–15.0)
MCH: 26.4 pg (ref 26.0–34.0)
MCHC: 31.2 g/dL (ref 30.0–36.0)
MCV: 84.9 fL (ref 78.0–100.0)
Platelets: 346 10*3/uL (ref 150–400)
RBC: 3.97 MIL/uL (ref 3.87–5.11)
RDW: 17.2 % — ABNORMAL HIGH (ref 11.5–15.5)
WBC: 4.1 10*3/uL (ref 4.0–10.5)

## 2017-07-22 LAB — CK: CK TOTAL: 2096 U/L — AB (ref 38–234)

## 2017-07-22 MED ORDER — SODIUM CHLORIDE 0.9% FLUSH
10.0000 mL | Freq: Two times a day (BID) | INTRAVENOUS | Status: DC
  Administered 2017-07-22 – 2017-07-23 (×2): 10 mL

## 2017-07-22 MED ORDER — MYCOPHENOLATE MOFETIL 250 MG PO CAPS
1000.0000 mg | ORAL_CAPSULE | Freq: Two times a day (BID) | ORAL | Status: DC
  Administered 2017-07-22 – 2017-07-23 (×2): 1000 mg via ORAL
  Filled 2017-07-22 (×3): qty 4

## 2017-07-22 MED ORDER — BOOST / RESOURCE BREEZE PO LIQD CUSTOM
1.0000 | Freq: Three times a day (TID) | ORAL | Status: DC
  Administered 2017-07-23 (×2): 1 via ORAL

## 2017-07-22 MED ORDER — HYDRALAZINE HCL 20 MG/ML IJ SOLN
10.0000 mg | Freq: Four times a day (QID) | INTRAMUSCULAR | Status: DC | PRN
  Administered 2017-07-22: 10 mg via INTRAVENOUS
  Filled 2017-07-22 (×2): qty 1

## 2017-07-22 NOTE — Clinical Social Work Note (Signed)
Clinical Social Work Assessment  Patient Details  Name: [name] [name] MRN: [id number] Date of Birth: 02-21-1940  Date of referral:  07/22/17               Reason for consult:  Facility Placement                Permission sought to share information with:  Facility Sport and exercise psychologist, Family Supports Permission granted to share information::  Yes, Verbal Permission Granted  Name::     Metallurgist::  SNFs  Relationship::  Daughter  Contact Information:  [phone]  Housing/Transportation Living arrangements for the past 2 months:  [place], [place] of Information:  Patient, Adult Children Patient Interpreter Needed:  None Criminal Activity/Legal Involvement Pertinent to Current Situation/Hospitalization:  No - Comment as needed Significant Relationships:  Adult Children Lives with:  Adult Children Do you feel safe going back to the place where you live?  No Need for family participation in patient care:  No (Coment)  Care giving concerns:  CSW received consult for possible SNF placement at time of discharge. CSW spoke with patient and daughter at bedside regarding PT recommendation of SNF placement at time of discharge. Patient reported that she admitted to [facility] from 6/13 to 7/3 and would like to go to SNF again. CSW to continue to follow and assist with discharge planning needs.  Social Worker assessment / plan:  CSW spoke with patient concerning possibility of rehab at [facility] before returning home.  Employment status:  Retired Nurse, adult PT Recommendations:  [place]rient / Referral to community resources:  [place]  Patient/Family's Response to care:  Patient recognizes need for rehab before returning home and is agreeable to a SNF in [place]. Patient reported preference for [facility]. She did not have an enjoyable at [place].  CSW  explained that patient is in copay days and would be expected to pay $170/day if approved by insurance. Patient's daughter asked CSW to contact patient's DSS Worker, Ms. Anson Fret [phone]) to check on the status of Medicaid application. Currently patient only has MQB for home services and patient does not yet have Medicaid. Patient's daughter reports understanding and is willing to do a payment plan for the co-payments. CSW searching for an available facility.    Patient/Family's Understanding of and Emotional Response to Diagnosis, Current Treatment, and Prognosis:  Patient/family is realistic regarding therapy needs and expressed being hopeful for SNF placement. Patient expressed understanding of CSW role and discharge process as well as medical condition. No questions/concerns about plan or treatment.    Emotional Assessment Appearance:  Appears stated age Attitude/Demeanor/Rapport:  Gracious Affect (typically observed):  Accepting, Appropriate Orientation:  Oriented to Self, Oriented to Place, Oriented to Situation Alcohol / Substance use:  Not Applicable Psych involvement (Current and /or in the community):  No (Comment)  Discharge Needs  Concerns to be addressed:  Care Coordination Readmission within the last 30 days:  No Current discharge risk:  Dependent with Mobility Barriers to Discharge:  Continued Medical Work up   [employer], [place] 07/22/2017, 12:16 PM

## 2017-07-22 NOTE — Evaluation (Signed)
Speech Language Pathology Evaluation Patient Details Name: [name] [name] MRN: [id number] DOB: [birth year] Today's Date: 07/22/2017 Time: 4098-1191 SLP Time Calculation (min) (ACUTE ONLY): 15 min  Problem List:  Patient Active Problem List   Diagnosis Date Noted  . Neck mass 07/21/2017  . Neuropathic pain   . 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 Neurologic deficit due to acute ischemic stroke ([place])   . Hypokalemia   . CVA (cerebral vascular accident) ([place]) 06/09/2017  . Multiple pulmonary nodules 06/09/2017  . Hypothermia 10/22/2016  . Chronic diastolic CHF (congestive heart failure) ([place]) 10/22/2016  . Abdominal pain, unspecified site 05/17/2013  . Nausea alone 05/17/2013  . 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 Abdominal pain, left lower quadrant 05/28/2011  . Cecal ulcer with hemorrhage 01/10/2011  . Acute blood loss anemia 12/06/2010  . Hematochezia 12/05/2010  . Anemia 12/05/2010  . Adrenal insufficiency ([place]) 12/05/2010  . Osteoporosis 12/05/2010  . LUNG INVOLVEMENT OTHER DISEASES CLASSIFIED ELSW 08/13/2009  . OTHER SPECIFIED DISORDER OF STOMACH AND DUODENUM 07/06/2008  . Essential hypertension 04/26/2007  . ALLERGIC RHINITIS 04/26/2007  . GASTROESOPHAGEAL REFLUX DISEASE 04/26/2007  . Polymyositis ([place]) 04/26/2007  . COLONIC POLYPS, ADENOMATOUS 02/06/2005   Past Medical History:  Past Medical History:  Diagnosis Date  . Allergic rhinitis, cause unspecified   . Benign neoplasm of colon   . Chronic diastolic CHF (congestive heart failure) ([place])   . CVA (cerebral vascular accident) ([place]) 05/2017  . Esophageal reflux   . Excessive daytime sleepiness   . 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 GI hemorrhage 2011   recurrent  . Hyperlipidemia   . Osteoporosis   . Polymyositis ([place])   . Unspecified essential hypertension   . Vitamin D deficiency    Past Surgical History:  Past Surgical History:  Procedure  Laterality Date  . ABDOMINAL HYSTERECTOMY     partial  . BACK SURGERY    . COLONOSCOPY  01/09/2011   others also  . COLONOSCOPY  05/29/2011   Procedure: COLONOSCOPY;  Surgeon: Gatha Mayer, MD;  [facility];  Service: Endoscopy;  Laterality: N/A;  Greggory Brandy Carlean Purl  . COLONOSCOPY WITH PROPOFOL N/A 02/16/2014   Procedure: COLONOSCOPY WITH PROPOFOL;  Surgeon: Milus Banister, MD;  [facility];  Service: Endoscopy;  Laterality: N/A;  . ESOPHAGOGASTRODUODENOSCOPY  12/08/2010   others also  . ESOPHAGOGASTRODUODENOSCOPY (EGD) WITH PROPOFOL N/A 02/16/2014   Procedure: ESOPHAGOGASTRODUODENOSCOPY (EGD) WITH PROPOFOL;  Surgeon: Milus Banister, MD;  [facility];  Service: Endoscopy;  Laterality: N/A;  . EUS    . HOT HEMOSTASIS  05/29/2011   Procedure: HOT HEMOSTASIS (ARGON PLASMA COAGULATION/BICAP);  Surgeon: Gatha Mayer, MD;  Location[facility];  Service: Endoscopy;  Laterality: N/A;  . LUMBAR LAMINECTOMY    . ROTATOR CUFF REPAIR Bilateral   . TONSILLECTOMY  age 19   HPI:  77 y.o. female with PMH of hypertension, polymyositis, diastolic CHF, colonic AVM, was admitted to the [facility] emergency room on 06/09/2017 with complaints of generalized numbness and weakness that started around 5 AM on the morning of 06/09/2017.  Imaging revealed mutilple scattered left MCA territory strokes. Went to [facility] and then [facility] due to still not being mobile. Went home with daughter and return today with B hip and UE pain   Assessment / Plan / Recommendation Clinical Impression  Pt presents with cognitive-linguistic function consistent with performance at time of D/C from CIR -  per daughter, her mother has bouts of confusion, baseline memory loss and impaired executive functioning.  Today, pt recalled 4/5 items after 5 minute delay; language expression and comprehension are intact.  Speech is clear and fluent.  There are no notable changes in performance; pt is able to communicate  [facility].  No acute SLP needs are identified - our services will sign off.     SLP Assessment  SLP Recommendation/Assessment: Patient does not need any further Speech Lanaguage Pathology Services    Follow Up Recommendations       Frequency and Duration           SLP Evaluation Cognition  Overall Cognitive Status: History of cognitive impairments - at baseline Arousal/Alertness: Awake/alert Orientation Level: Oriented to person;Oriented to situation;Oriented to place;Disoriented to time Attention: Sustained Sustained Attention: Appears intact Memory: Impaired Memory Impairment: Storage deficit Awareness: Impaired Awareness Impairment: Intellectual impairment Executive Function: Reasoning Reasoning: Impaired Reasoning Impairment: Verbal basic       Comprehension  Auditory Comprehension Overall Auditory Comprehension: Appears within functional limits for tasks assessed Reading Comprehension Reading Status: Unable to assess (comment)(low visual acuity/glasses not present)    Expression Expression Primary Mode of Expression: Verbal Verbal Expression Overall Verbal Expression: Appears within functional limits for tasks assessed Written Expression Dominant Hand: Right   Oral / Motor  Motor Speech Overall Motor Speech: Appears within functional limits for tasks assessed   GO                    [name] [name] 07/22/2017, 12:46 PM

## 2017-07-22 NOTE — Progress Notes (Signed)
Physical Therapy Treatment Patient Details Name: [name] [name] MRN: [id number] DOB: January 21, 1940 Today's Date: 07/22/2017    History of Present Illness 77 y.o. female with PMH of hypertension, polymyositis, diastolic CHF, colonic AVM, was admitted to the [facility] emergency room on 06/09/2017 with complaints of generalized numbness and weakness that started around 5 AM on the morning of 06/09/2017.  Imaging revealed mutilple scattered left MCA territory strokes. Went to [facility] and then [facility] due to still not being mobile. Went home with daughter and return today with B hip and UE pain    PT Comments    Patient progressing to EOB and some scooting transfers along EOB.  Remains fearful of LE pain, but much improved per her report.  Agrees to OOB next session.  Continues to be appropriate for SNF level rehab if family unable to assist at home.  PT to follow acutely.   Follow Up Recommendations  SNF;Supervision/Assistance - 24 hour     Equipment Recommendations  None recommended by PT    Recommendations for Other Services       Precautions / Restrictions Precautions Precautions: Fall    Mobility  Bed Mobility Overal bed mobility: Independent Bed Mobility: Rolling;Sidelying to Sit Rolling: Mod assist Sidelying to sit: Mod assist;HOB elevated     Sit to sidelying: Mod assist;+2 for safety/equipment General bed mobility comments: needed cues for technique to use rail, able to bring legs mostly near EOB and scoot hips prior to lifting trunk with assist, to supine assist for legs and trunk  Transfers Overall transfer level: Needs assistance   Transfers: Squat Pivot Transfers     Squat pivot transfers: +2 physical assistance;Max assist     General transfer comment: scoot pivot x 2 towards HOB with minimal weight acceptance on her feet, fear of increased pain as was last pm.   Ambulation/Gait                 Stairs             Wheelchair Mobility     Modified Rankin (Stroke Patients Only)       Balance Overall balance assessment: Needs assistance Sitting-balance support: Feet supported Sitting balance-Leahy Scale: Good                                      Cognition Arousal/Alertness: Awake/alert Behavior During Therapy: WFL for tasks assessed/performed Overall Cognitive Status: History of cognitive impairments - at baseline                                 General Comments: seemed oriented at first stating "they feel like my pain is not from my stroke, but from my muscles," but later asking if we had gotten tickets to come see her      Exercises Other Exercises Other Exercises: some knee extension performed, but limited due to fear of pain, noted some nodules visible in muscle with activation    General Comments General comments (skin integrity, edema, etc.): discussing how she was home from [place] x 1 day and son helped her to electric scooter for transfers, but legs hurting and she came to hospital      Pertinent Vitals/Pain Pain Assessment: Faces Faces Pain Scale: Hurts little more Pain Location: Bil LEs; and R UE  Pain Descriptors / Indicators: Tightness;Aching;Sore Pain Intervention(s): Monitored  during session;Repositioned;Limited activity within patient's tolerance    Home Living                      Prior Function            PT Goals (current goals can now be found in the care plan section) Progress towards PT goals: Progressing toward goals(slowly)    Frequency    Min 2X/week      PT Plan Current plan remains appropriate    Co-evaluation              AM-PAC PT "6 Clicks" Daily Activity  Outcome Measure  Difficulty turning over in bed (including adjusting bedclothes, sheets and blankets)?: Unable Difficulty moving from lying on back to sitting on the side of the bed? : Unable Difficulty sitting down on and standing up from a chair with arms (e.g.,  wheelchair, bedside commode, etc,.)?: Unable Help needed moving to and from a bed to chair (including a wheelchair)?: Total Help needed walking in hospital room?: Total Help needed climbing 3-5 steps with a railing? : Total 6 Click Score: 6    End of Session Equipment Utilized During Treatment: Gait belt Activity Tolerance: Patient limited by pain Patient left: in bed;with call bell/phone within reach;with bed alarm set   PT Visit Diagnosis: Muscle weakness (generalized) (M62.81);Other abnormalities of gait and mobility (R26.89)     Time: 1450-1515 PT Time Calculation (min) (ACUTE ONLY): 25 min  Charges:  $Therapeutic Activity: 23-37 mins                    G CodesMagda Kiel, [place] [phone] 07/22/2017    [name] 07/22/2017, 5:26 PM

## 2017-07-22 NOTE — Evaluation (Signed)
Occupational Therapy Evaluation Patient Details Name: [name] [name] MRN: [id number] DOB: 1940-12-17 Today's Date: 07/22/2017    History of Present Illness 77 y.o. female with PMH of hypertension, polymyositis, diastolic CHF, colonic AVM, was admitted to the [facility] emergency room on 06/09/2017 with complaints of generalized numbness and weakness that started around 5 AM on the morning of 06/09/2017.  Imaging revealed mutilple scattered left MCA territory strokes. Went to [facility] and then [facility] due to still not being mobile. Went home with daughter and return today with B hip and UE pain   Clinical Impression   Pt reports daughter assisting with ADL at bed level PTA and performing transfers to w/c via hoyer lift. Currently pt requires max assist overall for bed mobility and mod-total assist for ADL. Pt tolerated sitting EOB x10 minutes with min guard assist throughout. Recommending SNF for follow up to maximize independence and safety with ADL and functional mobility. Pt would benefit from continued skilled OT to address established goals.    Follow Up Recommendations  SNF;Supervision/Assistance - 24 hour    Equipment Recommendations  None recommended by OT    Recommendations for Other Services       Precautions / Restrictions Precautions Precautions: Fall Restrictions Weight Bearing Restrictions: No      Mobility Bed Mobility Overal bed mobility: Needs Assistance Bed Mobility: Rolling;Sidelying to Sit;Sit to Sidelying Rolling: Max assist Sidelying to sit: Max assist     Sit to sidelying: Mod assist General bed mobility comments: Assist for LEs and trunk elevation. Use of bed pad to scoot hips  Transfers                      Balance Overall balance assessment: Needs assistance Sitting-balance support: Feet supported Sitting balance-Leahy Scale: Good Sitting balance - Comments: Min guard for sitting balance x10 minutes                                    ADL either performed or assessed with clinical judgement   ADL Overall ADL's : Needs assistance/impaired Eating/Feeding: Minimal assistance;Sitting   Grooming: Minimal assistance;Sitting   Upper Body Bathing: Moderate assistance;Sitting   Lower Body Bathing: Total assistance;Bed level   Upper Body Dressing : Moderate assistance;Sitting   Lower Body Dressing: Total assistance;Bed level     Toilet Transfer Details (indicate cue type and reason): Pt on bedpan upon arrival. Assisted pt off bedpan  Toileting- Clothing Manipulation and Hygiene: Total assistance;Bed level         General ADL Comments: Pt tolerated sitting EOB x10 minutes with min guard assist throughout     Vision         Perception     Praxis      Pertinent Vitals/Pain Pain Assessment: Faces Faces Pain Scale: Hurts a little bit Pain Location: Bil LEs Pain Descriptors / Indicators: Discomfort Pain Intervention(s): Monitored during session;Repositioned     Hand Dominance Right   Extremity/Trunk Assessment Upper Extremity Assessment Upper Extremity Assessment: Generalized weakness   Lower Extremity Assessment Lower Extremity Assessment: Defer to PT evaluation   Cervical / Trunk Assessment Cervical / Trunk Assessment: Normal   Communication Communication Communication: No difficulties   Cognition Arousal/Alertness: Awake/alert Behavior During Therapy: WFL for tasks assessed/performed Overall Cognitive Status: Within Functional Limits for tasks assessed  General Comments       Exercises     Shoulder Instructions      Home Living Family/patient expects to be discharged to:: Skilled nursing facility Living Arrangements: Children Available Help at Discharge: Family;Available 24 hours/day Type of Home: Apartment Home Access: Level entry     Home Layout: One level     Bathroom Shower/Tub: Engineer, site: Standard     Home Equipment: Wheelchair - power;Adaptive equipment;Grab bars - toilet Adaptive Equipment: Reacher Additional Comments: Accessible via power chair      Prior Functioning/Environment Level of Independence: Needs assistance  Gait / Transfers Assistance Needed: pt reports most recently she has been using bed pan for toileting but has been getting in w/c, question her history though as she kept asking same question multiple times ADL's / Homemaking Assistance Needed: sponge bathes with family set up   Comments: Daughter reports difficulty taking care of pt at home over the past few days since d/c from SNF        OT Problem List:        OT Treatment/Interventions: Self-care/ADL training;Therapeutic exercise;Energy conservation;DME and/or AE instruction;Therapeutic activities;Patient/family education;Balance training    OT Goals(Current goals can be found in the care plan section) Acute Rehab OT Goals Patient Stated Goal: go back to rehab OT Goal Formulation: With patient/family Time For Goal Achievement: 08/05/17 Potential to Achieve Goals: Good ADL Goals Pt Will Perform Grooming: with min guard assist;sitting Pt Will Perform Upper Body Bathing: with min assist;sitting Pt Will Perform Lower Body Bathing: with min assist;sitting/lateral leans Pt/caregiver will Perform Home Exercise Program: Increased strength;With theraband;Both right and left upper extremity Additional ADL Goal #1: Pt will perform bed mobility with min assist as precursor to ADL.  OT Frequency: Min 2X/week   Barriers to D/C:            Co-evaluation              AM-PAC PT "6 Clicks" Daily Activity     Outcome Measure Help from another person eating meals?: A Little Help from another person taking care of personal grooming?: A Lot Help from another person toileting, which includes using toliet, bedpan, or urinal?: Total Help from another person bathing (including washing, rinsing,  drying)?: Total Help from another person to put on and taking off regular upper body clothing?: A Lot Help from another person to put on and taking off regular lower body clothing?: Total 6 Click Score: 10   End of Session    Activity Tolerance: Patient tolerated treatment well Patient left: in bed;with call bell/phone within reach;with bed alarm set;with family/visitor present  OT Visit Diagnosis: Other abnormalities of gait and mobility (R26.89);History of falling (Z91.81);Muscle weakness (generalized) (M62.81);Pain Pain - Right/Left: (bil) Pain - part of body: Leg                Time: 4098-1191 OT Time Calculation (min): 32 min Charges:  OT General Charges $OT Visit: 1 Visit OT Evaluation $OT Eval Moderate Complexity: 1 Mod OT Treatments $Self Care/Home Management : 8-22 mins G-Codes:     [name] [name] A. Ulice Brilliant, M.S., OTR/L Acute Rehab Department: [phone]  [name] [name] 07/22/2017, 12:03 PM

## 2017-07-22 NOTE — Progress Notes (Signed)
CSW updated patient's daughter that [facility] is unable to accept patient. However, [facility] is able to offer a semi private room and work with the patient on a payment plan while Medicaid is being applied for. Patient's daughter stated she will discuss it with the patient and let CSW know in the morning.   Percell Locus Basel Defalco LCSW [phone]

## 2017-07-22 NOTE — Progress Notes (Signed)
Initial Nutrition Assessment  DOCUMENTATION CODES:   Not applicable  INTERVENTION:    Boost Breeze po TID, each supplement provides 250 kcal and 9 grams of protein   D/C Ensure Enlive  NUTRITION DIAGNOSIS:   Increased nutrient needs related to chronic illness as evidenced by estimated needs  GOAL:   Patient will meet greater than or equal to 90% of their needs  MONITOR:   PO intake, Supplement acceptance, Labs, Skin, Weight trends, I & O's  REASON FOR ASSESSMENT:   Malnutrition Screening Tool  ASSESSMENT:  77 y.o. Female with PMH of polymyositis, diastolic CHF, was admitted to the [facility] ER on 06/09/2017 with complaints of generalized numbness and weakness that started around 5 AM on the morning of 06/09/2017.  Imaging revealed mutilple scattered left MCA territory strokes. Went to [facility] and then [facility] due to still not being mobile. Returned to [facility] with B hip and UE pain.  RD spoke with pt and daughter at bedside. Daughter helping pt prepare her sandwich for lunch. PO intake 50% per flowsheet records.  Daughter reports Ensure supplements make pt "squirt". Pt was amenable to trying Boost Breeze (clear liquid supplement). Labs and medications reviewed.  Per Malnutrition Screening Tool, pt reported a 40 lb weight loss (20%) x 6 months. Readings below reveal pt's had a 10% weight loss since 12/2016. Not significant for time frame. RD unable to complete Nutrition Focused Physical Exam at this time.  Diet Order:   Diet Order           Diet Heart Room service appropriate? Yes; Fluid consistency: Thin  Diet effective now         EDUCATION NEEDS:   No education needs have been identified at this time  Skin:  Skin Assessment: Reviewed RN Assessment  Last BM:  7/8  Height:   Ht Readings from Last 1 Encounters:  07/21/17 5\' 3"  (1.6 m)   Weight:   Wt Readings from Last 1 Encounters:  07/21/17 144 lb 6.4 oz (65.5 kg)   Wt Readings from Last 10 Encounters:   07/21/17 144 lb 6.4 oz (65.5 kg)  06/25/17 145 lb 15.1 oz (66.2 kg)  06/09/17 170 lb (77.1 kg)  05/15/17 170 lb (77.1 kg)  05/04/17 170 lb (77.1 kg)  03/09/17 172 lb (78 kg)  02/06/17 170 lb (77.1 kg)  12/15/16 160 lb (72.6 kg)  10/22/16 158 lb 1.1 oz (71.7 kg)  09/19/16 172 lb (78 kg)   BMI:  Body mass index is 25.58 kg/m.  Estimated Nutritional Needs:   Kcal:  1700-1900  Protein:  80-95 gm  Fluid:  1.7-1.9 L  Arthur Holms, RD, LDN Pager #: [phone] After-Hours Pager #: [phone]

## 2017-07-22 NOTE — Progress Notes (Signed)
PROGRESS NOTE  [name] [name]:[id number] DOB: 01-15-1940 DOA: 07/21/2017 PCP: [name], MD  HPI/Recap of past 24 hours:   Assessment/Plan: Principal Problem:   Polymyositis ([place]) Active Problems:   Essential hypertension   Chronic diastolic CHF (congestive heart failure) ([facility])   CVA (cerebral vascular accident) ([place])   Multiple pulmonary nodules   Neck mass   Polymyositis, with progressive generalized weakness/FTT/ total care Discussed case with her rheumatologist Dr. Amil Amen state that patient has not been seen in rheumatology for more than a year.  Dr. Amil Amen is not sure whether patients do take her CellCept.  Dr. Amil Amen recommended started patient back on her home dose CellCept 1000 mg twice a day, continue steroid, commend discharge patient on prednisone 60 daily (dose into 20 mg 3 times daily with meals he if not able to tolerate one-time dose), need to see Dr. Amil Amen in 2 to 3 weeks to decide tolerability ability of CellCept and steroid.  Decide the next step of action for polymyositis management. Will need 24/7 care  Right buttock skin breakdown, stage II pressure injury presented on admission Foam protection, skin protocol  Recent CVA -She was sent from CIR to SNF rehab because she continued to require total assist Progressive weakness, likely due to polymyositis - HTN -Continue Atenolol  Chronic diastolic CHF -Preserved EF, grade 1 diastolic dysfunction on 8/11 -Appears to be compensated at this time  Pulmonary nodules -No h/o tobacco use -Suggest repeat non-contrast CT 3-6 months from 06/09/17; if stable, it would be reasonable to f/u in 2-4 years  Neck mass -Palpable fullness along the region of the sternal notch -It appears to be too low for goiter -Thymoma is a consideration -She had a recent head and neck CTA without apparent comment on this issue -neck US "Negative sonographic survey of the suprasternal notch."    Code Status:  DNR  Family Communication: patient and daughter over the phone  Disposition Plan: SNF when bed available   Consultants:  Phone conversation with Rheumatology Dr Amil Amen  Procedures:  none  Antibiotics:  none   Objective: BP (!) 183/91 (BP Location: Left Arm)   Pulse 74   Temp 98.5 F (36.9 C) (Oral)   Resp 18   Ht 5\' 3"  (1.6 m)   Wt 65.5 kg (144 lb 6.4 oz)   SpO2 100%   BMI 25.58 kg/m   Intake/Output Summary (Last 24 hours) at 07/22/2017 1635 Last data filed at 07/22/2017 1203 Gross per 24 hour  Intake 945 ml  Output 500 ml  Net 445 ml   Filed Weights   07/21/17 0858 07/21/17 1528  Weight: 64.9 kg (143 lb) 65.5 kg (144 lb 6.4 oz)    Exam: Patient is examined daily including today on 07/22/2017, exams remain the same as of yesterday except that has changed    General:  Frail, chronically ill  Cardiovascular: RRR  Respiratory: CTABL  Abdomen: Soft/ND/NT, positive BS  Musculoskeletal: No Edema  Neuro: alert, oriented , generalized weakness, not able to lift leg against gravity  Data Reviewed: Basic Metabolic Panel: Recent Labs  Lab 07/21/17 0907 07/22/17 0500  NA 143 142  K 3.7 4.0  CL 105 105  CO2 24 23  GLUCOSE 86 135*  BUN 30* 21  CREATININE 0.81 0.61  CALCIUM 9.8 9.4   Liver Function Tests: No results for input(s): AST, ALT, ALKPHOS, BILITOT, PROT, ALBUMIN in the last 168 hours. No results for input(s): LIPASE, AMYLASE in the last 168 hours. No results  for input(s): AMMONIA in the last 168 hours. CBC: Recent Labs  Lab 07/21/17 0907 07/22/17 0500  WBC 5.5 4.1  NEUTROABS 2.5  --   HGB 10.9* 10.5*  HCT 35.8* 33.7*  MCV 85.9 84.9  PLT 356 346   Cardiac Enzymes:   Recent Labs  Lab 07/21/17 0907 07/22/17 0500  CKTOTAL 2,750* 2,096*   BNP (last 3 results) No results for input(s): BNP in the last 8760 hours.  ProBNP (last 3 results) No results for input(s): PROBNP in the last 8760 hours.  CBG: No results for input(s):  GLUCAP in the last 168 hours.  No results found for this or any previous visit (from the past 240 hour(s)).   Studies: No results found.  Scheduled Meds: . aspirin  81 mg Oral Daily  . atenolol  50 mg Oral Daily  . atorvastatin  10 mg Oral q1800  . docusate sodium  100 mg Oral BID  . enoxaparin (LOVENOX) injection  40 mg Subcutaneous Q24H  . febuxostat  40 mg Oral Daily  . feeding supplement  1 Container Oral TID BM  . folic acid  536 mcg Oral Daily  . gabapentin  100 mg Oral BID  . methylPREDNISolone (SOLU-MEDROL) injection  80 mg Intravenous Q12H  . mycophenolate  250 mg Oral BID  . pantoprazole  40 mg Oral Daily  . sodium chloride flush  10-40 mL Intracatheter Q12H    Continuous Infusions: . sodium chloride 1,000 mL (07/22/17 1203)     Time spent: 28mins I have personally reviewed and interpreted on  07/22/2017 daily labs,  imagings as discussed above under date review session and assessment and plans.  I reviewed all nursing notes, vitals, pertinent old records  I have discussed plan of care as described above with RN , patient and family on 07/22/2017   Florencia Reasons MD, PhD  [facility] Pager [phone]. If 7PM-7AM, please contact night-coverage at [email or web address], password [facility] 07/22/2017, 4:35 PM  LOS: 0 days

## 2017-07-22 NOTE — NC FL2 (Signed)
[place] LEVEL OF CARE SCREENING TOOL     IDENTIFICATION  Patient Name: [name] [name] Birthdate: 25-May-1940 Sex: female Admission Date (Current Location): 07/21/2017  [facility] and [place] Number:  Herbalist and Address:  [facility] [place] [address], [place], [place] [address]      Provider Number: [id number]  Attending Physician Name and Address:  Florencia Reasons, MD  Relative Name and Phone Number:  Guerry Minors, daughter [phone]    Current Level of Care: Hospital Recommended Level of Care: [place] Prior Approval Number:    Date Approved/Denied:   PASRR Number: [id number]  Discharge Plan: SNF    Current Diagnoses: Patient Active Problem List   Diagnosis Date Noted  . Neck mass 07/21/2017  . Neuropathic pain   . 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 Neurologic deficit due to acute ischemic stroke ([place])   . Hypokalemia   . CVA (cerebral vascular accident) ([place]) 06/09/2017  . Multiple pulmonary nodules 06/09/2017  . Hypothermia 10/22/2016  . Chronic diastolic CHF (congestive heart failure) ([place]) 10/22/2016  . Abdominal pain, unspecified site 05/17/2013  . Nausea alone 05/17/2013  . 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 Abdominal pain, left lower quadrant 05/28/2011  . Cecal ulcer with hemorrhage 01/10/2011  . Acute blood loss anemia 12/06/2010  . Hematochezia 12/05/2010  . Anemia 12/05/2010  . Adrenal insufficiency ([place]) 12/05/2010  . Osteoporosis 12/05/2010  . LUNG INVOLVEMENT OTHER DISEASES CLASSIFIED ELSW 08/13/2009  . OTHER SPECIFIED DISORDER OF STOMACH AND DUODENUM 07/06/2008  . Essential hypertension 04/26/2007  . ALLERGIC RHINITIS 04/26/2007  . GASTROESOPHAGEAL REFLUX DISEASE 04/26/2007  . Polymyositis ([place]) 04/26/2007  . COLONIC POLYPS, ADENOMATOUS 02/06/2005    Orientation RESPIRATION BLADDER Height & Weight     Self, Situation, Place  Normal Continent Weight: 144 lb 6.4 oz (65.5 kg) Height:  5\' 3"  (160 cm)  BEHAVIORAL SYMPTOMS/MOOD NEUROLOGICAL BOWEL NUTRITION STATUS      Continent Diet(see Discharge Summary)  AMBULATORY STATUS COMMUNICATION OF NEEDS Skin   Total Care Verbally PU Stage and Appropriate Care(Stage II on Buttocks)                       Personal Care Assistance Level of Assistance  Bathing, Feeding, Dressing Bathing Assistance: Maximum assistance Feeding assistance: Maximum assistance Dressing Assistance: Maximum assistance     Functional Limitations Info  Sight, Hearing, Speech Sight Info: Adequate Hearing Info: Adequate Speech Info: Adequate    SPECIAL CARE FACTORS FREQUENCY  PT (By licensed PT), OT (By licensed OT)     PT Frequency: 5x per wk OT Frequency: 3x per wk            Contractures Contractures Info: Not present    Additional Factors Info  Code Status, Allergies Code Status Info: DNR Allergies Info: Codeine, Hydrocodone,Tramadol Hcl           Current Medications (07/22/2017):  This is the current hospital active medication list Current Facility-Administered Medications  Medication Dose Route Frequency Provider Last Rate Last Dose  . 0.9 %  sodium chloride infusion   Intravenous Continuous Karmen Bongo, MD 125 mL/hr at 07/22/17 1203 1,000 mL at 07/22/17 1203  . acetaminophen (TYLENOL) tablet 650 mg  650 mg Oral Q6H PRN Karmen Bongo, MD   650 mg at 07/21/17 1627   Or  . acetaminophen (TYLENOL) suppository 650 mg  650 mg Rectal Q6H PRN Karmen Bongo, MD      .  ALPRAZolam Duanne Moron) tablet 0.5 mg  0.5 mg Oral BID PRN Karmen Bongo, MD      . aspirin chewable tablet 81 mg  81 mg Oral Daily Karmen Bongo, MD   81 mg at 07/22/17 9417  . atenolol (TENORMIN) tablet 50 mg  50 mg Oral Daily Karmen Bongo, MD   50 mg at 07/22/17 4081  . atorvastatin (LIPITOR) tablet 10 mg  10 mg Oral q1800 Karmen Bongo, MD   10 mg at 07/21/17 1821  . docusate sodium (COLACE)  capsule 100 mg  100 mg Oral BID Karmen Bongo, MD   100 mg at 07/22/17 0920  . enoxaparin (LOVENOX) injection 40 mg  40 mg Subcutaneous Q24H Karmen Bongo, MD   40 mg at 07/21/17 1822  . febuxostat (ULORIC) tablet 40 mg  40 mg Oral Daily Karmen Bongo, MD   40 mg at 07/22/17 4481  . feeding supplement (ENSURE ENLIVE) (ENSURE ENLIVE) liquid 237 mL  237 mL Oral BID BM Karmen Bongo, MD   237 mL at 07/22/17 1003  . folic acid (FOLVITE) tablet 0.5 mg  500 mcg Oral Daily Karmen Bongo, MD   0.5 mg at 07/22/17 8563  . gabapentin (NEURONTIN) capsule 100 mg  100 mg Oral BID Karmen Bongo, MD   100 mg at 07/22/17 0921  . methylPREDNISolone sodium succinate (SOLU-MEDROL) 125 mg/2 mL injection 80 mg  80 mg Intravenous Lillia Mountain, MD   80 mg at 07/22/17 0217  . morphine 2 MG/ML injection 2 mg  2 mg Intravenous Q2H PRN Karmen Bongo, MD   2 mg at 07/22/17 0615  . mycophenolate (CELLCEPT) capsule 250 mg  250 mg Oral BID Karmen Bongo, MD   250 mg at 07/22/17 1497  . ondansetron (ZOFRAN) tablet 4 mg  4 mg Oral Q6H PRN Karmen Bongo, MD       Or  . ondansetron [facility]) injection 4 mg  4 mg Intravenous Q6H PRN Karmen Bongo, MD      . oxyCODONE (Oxy IR/ROXICODONE) immediate release tablet 5 mg  5 mg Oral Q4H PRN Karmen Bongo, MD   5 mg at 07/22/17 0953  . pantoprazole (PROTONIX) EC tablet 40 mg  40 mg Oral Daily Karmen Bongo, MD   40 mg at 07/22/17 0263  . senna-docusate (Senokot-S) tablet 2 tablet  2 tablet Oral QHS PRN Karmen Bongo, MD      . sodium chloride flush (NS) 0.9 % injection 10-40 mL  10-40 mL Intracatheter Q12H Karmen Bongo, MD         Discharge Medications: Please see discharge summary for a list of discharge medications.  Relevant Imaging Results:  Relevant Lab Results:   Additional Information SSN [id number]  Vinie Sill, LCSWA

## 2017-07-23 DIAGNOSIS — M3320 Polymyositis, organ involvement unspecified: Secondary | ICD-10-CM | POA: Diagnosis not present

## 2017-07-23 LAB — BASIC METABOLIC PANEL
ANION GAP: 9 (ref 5–15)
BUN: 16 mg/dL (ref 8–23)
CHLORIDE: 112 mmol/L — AB (ref 98–111)
CO2: 23 mmol/L (ref 22–32)
Calcium: 8.2 mg/dL — ABNORMAL LOW (ref 8.9–10.3)
Creatinine, Ser: 0.43 mg/dL — ABNORMAL LOW (ref 0.44–1.00)
GFR calc Af Amer: >60 mL/min (ref >60)
Glucose, Bld: 126 mg/dL — ABNORMAL HIGH (ref 70–99)
POTASSIUM: 2.9 mmol/L — AB (ref 3.5–5.1)
Sodium: 144 mmol/L (ref 135–145)

## 2017-07-23 LAB — CBC
HCT: 32.8 % — ABNORMAL LOW (ref 36.0–46.0)
Hemoglobin: 10.2 g/dL — ABNORMAL LOW (ref 12.0–15.0)
MCH: 26.2 pg (ref 26.0–34.0)
MCHC: 31.1 g/dL (ref 30.0–36.0)
MCV: 84.3 fL (ref 78.0–100.0)
PLATELETS: 347 10*3/uL (ref 150–400)
RBC: 3.89 MIL/uL (ref 3.87–5.11)
RDW: 17.3 % — AB (ref 11.5–15.5)
WBC: 3.7 10*3/uL — ABNORMAL LOW (ref 4.0–10.5)

## 2017-07-23 LAB — CK: CK TOTAL: 1552 U/L — AB (ref 38–234)

## 2017-07-23 LAB — URIC ACID: Uric Acid, Serum: 4.8 mg/dL (ref 2.5–7.1)

## 2017-07-23 MED ORDER — POTASSIUM CHLORIDE CRYS ER 20 MEQ PO TBCR
40.0000 meq | EXTENDED_RELEASE_TABLET | ORAL | Status: AC
  Administered 2017-07-23 (×2): 40 meq via ORAL
  Filled 2017-07-23 (×2): qty 2

## 2017-07-23 MED ORDER — HEPARIN SOD (PORK) LOCK FLUSH 100 UNIT/ML IV SOLN
500.0000 [IU] | INTRAVENOUS | Status: AC | PRN
  Administered 2017-07-23: 500 [IU]

## 2017-07-23 MED ORDER — FUROSEMIDE 40 MG PO TABS: 40.0000 mg | ORAL_TABLET | ORAL | 0 refills | Status: DC

## 2017-07-23 MED ORDER — MYCOPHENOLATE MOFETIL 250 MG PO CAPS: 1000.0000 mg | ORAL_CAPSULE | Freq: Two times a day (BID) | ORAL | 0 refills | Status: DC

## 2017-07-23 MED ORDER — MAGNESIUM SULFATE 2 GM/50ML IV SOLN
2.0000 g | Freq: Once | INTRAVENOUS | Status: AC
  Administered 2017-07-23: 2 g via INTRAVENOUS
  Filled 2017-07-23: qty 50

## 2017-07-23 MED ORDER — OXYCODONE HCL 5 MG PO TABS: 5.0000 mg | ORAL_TABLET | Freq: Four times a day (QID) | ORAL | 0 refills | Status: AC | PRN

## 2017-07-23 MED ORDER — OMEGA-3-ACID ETHYL ESTERS 1 G PO CAPS: 1.0000 g | ORAL_CAPSULE | Freq: Two times a day (BID) | ORAL | 0 refills | Status: DC

## 2017-07-23 MED ORDER — ALLOPURINOL 100 MG PO TABS: 100.0000 mg | ORAL_TABLET | Freq: Every day | ORAL | 0 refills | Status: DC

## 2017-07-23 MED ORDER — SODIUM CHLORIDE 0.9% FLUSH
10.0000 mL | INTRAVENOUS | Status: DC | PRN
  Administered 2017-07-23: 20 mL
  Administered 2017-07-23: 10 mL
  Filled 2017-07-23: qty 40

## 2017-07-23 MED ORDER — PREDNISONE 20 MG PO TABS: 20.0000 mg | ORAL_TABLET | Freq: Three times a day (TID) | ORAL | 0 refills | Status: DC

## 2017-07-23 NOTE — Clinical Social Work Placement (Signed)
   CLINICAL SOCIAL WORK PLACEMENT  NOTE  Date:  07/23/2017  Patient Details  Name: [name] [name] MRN: [id number] Date of Birth: [birth year]  Clinical Social Work is seeking post-discharge placement for this patient at the [place] level of care (*CSW will initial, date and re-position this form in  chart as items are completed):  Yes   Patient/family provided with [facility] Work Department's list of facilities offering this level of care within the geographic area requested by the patient (or if unable, by the patient's family).  Yes   Patient/family informed of their freedom to choose among providers that offer the needed level of care, that participate in Medicare, Medicaid or managed care program needed by the patient, have an available bed and are willing to accept the patient.  Yes   Patient/family informed of [facility]'s ownership interest in [facility] and [facility], as well as of the fact that they are under no obligation to receive care at these facilities.  PASRR submitted to EDS on       PASRR number received on       Existing PASRR number confirmed on 07/22/17     FL2 transmitted to all facilities in geographic area requested by pt/family on 07/22/17     FL2 transmitted to all facilities within larger geographic area on       Patient informed that his/her managed care company has contracts with or will negotiate with certain facilities, including the following:        Yes   Patient/family informed of bed offers received.  Patient chooses bed at [facility]     Physician recommends and patient chooses bed at      Patient to be transferred to [facility] on 07/23/17.  Patient to be transferred to facility by [facility]     Patient family notified on 07/23/17 of transfer.  Name of family member notified:  Daughter [place]      PHYSICIAN       Additional Comment:     _______________________________________________ Vinie Sill, [place] 07/23/2017, 11:57 AM

## 2017-07-23 NOTE — Discharge Summary (Addendum)
Discharge Summary  [name] [name] SWH:[id number] DOB: 1940-05-06  PCP: [name], MD  Admit date: 07/21/2017 Discharge date: 07/23/2017  Time spent: 53mins  Recommendations for Outpatient Follow-up:  1. F/u with SNF MD  for hospital discharge follow up, repeat cbc/bmp at follow up. 2. F/u with rheumatology Dr Amil Amen for polymyositis  3. F/u with neurology for recent h/o CVA  Discharge Diagnoses:  Active Hospital Problems   Diagnosis Date Noted  . Polymyositis ([place]) 04/26/2007  . Pressure injury of skin 07/22/2017  . Neck mass 07/21/2017  . CVA (cerebral vascular accident) ([place]) 06/09/2017  . Multiple pulmonary nodules 06/09/2017  . Chronic diastolic CHF (congestive heart failure) ([place]) 10/22/2016  . Essential hypertension 04/26/2007    Resolved Hospital Problems  No resolved problems to display.    Discharge Condition: stable  Diet recommendation: heart healthy  Filed Weights   07/21/17 0858 07/21/17 1528  Weight: 64.9 kg (143 lb) 65.5 kg (144 lb 6.4 oz)    History of present illness: (per admitting MD Dr Lorin Mercy) PCP: [name], MD Consultants:  Amil Amen - rheumatology; Edison Nasuti - GI; ortho Patient coming from:  Home - lives with daughter, currently, but the daughter is not able to care for her; NOK: Daughter, [phone]  Chief Complaint:  Hip pain  HPI: [name] [name] is a 77 y.o. female with medical history significant of polymyositis; HLD; and gastric leiomyoma presenting with hip pain.  She was hospitalized for a stroke and discharged to CIR on 6/19.  She was then discharged to [facility] for SNF rehab until 7.3  At that time, she went home with her daughter.   This morning, she developed abdominal pain without N/V/D.  Her daughter gave her Imodium but she did not improve.  She did have a normal BM yesterday.  She then developed leg pain this AM.  The abdominal pain has resolved but now her legs are still bothering her.  The pain starts in her groin and  radiates down her B thighs.  Her daughter reports that her mother is requiring total care and they are not able to be successful at home.  The patient can't move herself like she did before.  They believe she will need ongoing SNF placement.   ED Course:   Polymyositis causing pain.  Elevated CK.  May also need placement.  On cellcept, has been on steroids in the past.  Likely obs.    Hospital Course:  Principal Problem:   Polymyositis ([place]) Active Problems:   Essential hypertension   Chronic diastolic CHF (congestive heart failure) (H[facility])   CVA (cerebral vascular accident) ([place])   Multiple pulmonary nodules   Neck mass   Pressure injury of skin   Polymyositis, with progressive generalized weakness/FTT/ total care -Discussed case with her rheumatologist Dr. Amil Amen state that patient has not been seen in rheumatology for more than a year.  Dr. Amil Amen is not sure whether patients still  takes her CellCept.  -Dr. Amil Amen recommended started patient back on her home dose CellCept 1000 mg twice a day, continue steroid, commend discharge patient on prednisone 60 daily (dose into 20 mg 3 times daily with meals he if not able to tolerate one-time dose), need to see Dr. Amil Amen in 2 to 3 weeks to decide tolerability ability of CellCept and steroid.  Decide the next step of action for polymyositis management. Will need 24/7 care, snf placement -ck on presentation 2750, she received hydration, started on higher dose of cellcept, she received iv  solumedrol, statin discontinued -ck trending down at discharge to 1553. She appear slightly stronger and more comfortable at discharge.   Right buttock skin breakdown, stage II pressure injury presented on admission Foam protection, skin protocol  Recent CVA -She was sent from CIR to SNF rehab because she continued to require total assist -Progressive weakness, likely due to polymyositis -she is continued on atenolol, statin discontinued due to  elevated ck. Changed to fish oil. -she is to follow up with  Neurology on 7/115.  HTN -Continue Atenolol, lasix mwf.  Chronic diastolic CHF -Preserved EF, grade 1 diastolic dysfunction on 9/38 -home meds lasix held in the hospital, she received hydration Home meds lasix resumed at decreased frequency at discharge, she used to take lasix 40mg  daily, changed to lasix 40mg  MWF at discharge.  Hypokalemia: replace k  Pulmonary nodules -No h/o tobacco use -Suggest repeat non-contrast CT 3-6 months from 06/09/17; if stable, it would be reasonable to f/u in 2-4 years  Neck mass -Palpable fullness along the region of the sternal notch -It appears to be too low for goiter -Thymoma is a consideration -She had a recent head and neck CTA without apparent comment on this issue -neck US "Negative sonographic survey of the suprasternal notch."    Code Status: DNR  Family Communication: patient and daughter over the phone on 7/10  Disposition Plan: SNF   Consultants:  Phone conversation with Rheumatology Dr Amil Amen  Procedures:  none  Antibiotics:  none   Discharge Exam: BP (!) 149/74 (BP Location: Left Arm)   Pulse 79   Temp 98.9 F (37.2 C) (Oral)   Resp 18   Ht 5\' 3"  (1.6 m)   Wt 65.5 kg (144 lb 6.4 oz)   SpO2 100%   BMI 25.58 kg/m   General: NAD Cardiovascular: RRR Respiratory: CTABL Neuro: alert, oriented , generalized weakness, not able to lift leg against gravity, bilateral arm able to barely lift at 20degree.     Discharge Instructions You were cared for by a hospitalist during your hospital stay. If you have any questions about your discharge medications or the care you received while you were in the hospital after you are discharged, you can call the unit and asked to speak with the hospitalist on call if the hospitalist that took care of you is not available. Once you are discharged, your primary care physician will handle any further medical  issues. Please note that NO REFILLS for any discharge medications will be authorized once you are discharged, as it is imperative that you return to your primary care physician (or establish a relationship with a primary care physician if you do not have one) for your aftercare needs so that they can reassess your need for medications and monitor your lab values.  Discharge Instructions    Diet - low sodium heart healthy   Complete by:  As directed    Increase activity slowly   Complete by:  As directed      Allergies as of 07/23/2017      Reactions   Codeine Other (See Comments)   Feels drunk   Hydrocodone    "makes me sleepy"   Tramadol Hcl Other (See Comments)   Makes me feel drunk      Medication List    STOP taking these medications   ALPRAZolam 0.5 MG tablet Commonly known as:  XANAX   atorvastatin 10 MG tablet Commonly known as:  LIPITOR   ULORIC 40 MG tablet Generic drug:  febuxostat     TAKE these medications   acetaminophen 650 MG CR tablet Commonly known as:  TYLENOL 8 HOUR Take 1 tablet (650 mg total) by mouth every 8 (eight) hours as needed. What changed:  reasons to take this   allopurinol 100 MG tablet Commonly known as:  ZYLOPRIM Take 1 tablet (100 mg total) by mouth daily.   aspirin 81 MG chewable tablet Chew 1 tablet (81 mg total) by mouth daily.   atenolol 50 MG tablet Commonly known as:  TENORMIN Take 50 mg by mouth daily.   CALTRATE 600 1500 (600 Ca) MG Tabs tablet Generic drug:  calcium carbonate Take 2 tablets by mouth daily.   ferrous sulfate 325 (65 FE) MG tablet Take 325 mg by mouth daily with breakfast.   fluticasone 50 MCG/ACT nasal spray Commonly known as:  FLONASE Place 1 spray into both nostrils daily as needed for allergies.   folic acid 607 MCG tablet Commonly known as:  FOLVITE Take 400 mcg by mouth daily.   furosemide 40 MG tablet Commonly known as:  LASIX Take 1 tablet (40 mg total) by mouth every Monday, Wednesday,  and Friday. Start taking on:  07/24/2017 What changed:  when to take this   gabapentin 100 MG capsule Commonly known as:  NEURONTIN TAKE ONE CAPSULE BY MOUTH TWICE DAILY What changed:    how much to take  how to take this  when to take this   mycophenolate 250 MG capsule Commonly known as:  CELLCEPT Take 4 capsules (1,000 mg total) by mouth 2 (two) times daily. What changed:  how much to take   omega-3 acid ethyl esters 1 g capsule Commonly known as:  LOVAZA Take 1 capsule (1 g total) by mouth 2 (two) times daily.   oxyCODONE 5 MG immediate release tablet Commonly known as:  ROXICODONE Take 1 tablet (5 mg total) by mouth every 6 (six) hours as needed for up to 3 days.   pantoprazole 40 MG tablet Commonly known as:  PROTONIX Take 1 tablet (40 mg total) by mouth daily.   potassium chloride SA 20 MEQ tablet Commonly known as:  K-DUR,KLOR-CON Take 2 tablets (40 mEq total) by mouth daily.   predniSONE 20 MG tablet Commonly known as:  DELTASONE Take 1 tablet (20 mg total) by mouth 3 (three) times daily with meals.   senna-docusate 8.6-50 MG tablet Commonly known as:  Senokot-S Take 2 tablets by mouth at bedtime as needed for mild constipation.   vitamin B-12 1000 MCG tablet Commonly known as:  CYANOCOBALAMIN Take 1,000 mcg by mouth daily.   vitamin C 1000 MG tablet Take 1,000 mg by mouth daily.      Allergies  Allergen Reactions  . Codeine Other (See Comments)    Feels drunk  . Hydrocodone     "makes me sleepy"  . Tramadol Hcl Other (See Comments)    Makes me feel drunk   Follow-up Information    Hennie Duos, MD Follow up.   Specialty:  Rheumatology Contact information: [place] [address] [phone]        [name], MD Follow up.   Specialty:  Internal Medicine Contact information: [place] [address] [phone]            The results of significant diagnostics from this  hospitalization (including imaging, microbiology, ancillary and laboratory) are listed below for reference.    Significant Diagnostic Studies: US Soft Tissue  Neck  Result Date: 07/21/2017 CLINICAL DATA:  77 year old female with possible mass in the suprasternal notch. EXAM: ULTRASOUND OF HEAD/NECK SOFT TISSUES TECHNIQUE: Ultrasound examination of the head and neck soft tissues was performed in the area of clinical concern. COMPARISON:  None. FINDINGS: Sonographic interrogation of the region of clinical concern demonstrates no evidence of soft tissue or cystic mass. Incidental imaging of the thyroid gland demonstrates a mildly heterogeneous gland containing tiny subcentimeter colloid nodules which would not meet criteria for further evaluation. The thyroid cartilage is partially calcified. IMPRESSION: Negative sonographic survey of the suprasternal notch. Electronically Signed   By: Jacqulynn Cadet M.D.   On: 07/21/2017 16:31   Dg Hip Unilat W Or Wo Pelvis 2-3 Views Left  Result Date: 07/21/2017 CLINICAL DATA:  Left hip pain with no known injury EXAM: DG HIP (WITH OR WITHOUT PELVIS) 2-3V LEFT COMPARISON:  Coronal and sagittal reconstructed images through the pelvis and left hip from an abdominal and pelvic CT scan of May 15, 2017. FINDINGS: The bones are subjectively osteopenic. There is no lytic or blastic lesion of the pelvis. There is no acute fracture. AP and lateral views of the left hip reveal preservation of the joint space. The articular surface of the femoral head and acetabulum remains smoothly rounded. The femoral neck, intertrochanteric, and subtrochanteric regions are normal. IMPRESSION: There is no acute or significant chronic bony abnormality of the left hip. Electronically Signed   By: David  [place]ique M.D.   On: 07/21/2017 09:41    Microbiology: No results found for this or any previous visit (from the past 240 hour(s)).   Labs: Basic Metabolic Panel: Recent Labs  Lab 07/21/17 0907  07/22/17 0500 07/23/17 0401  NA 143 142 144  K 3.7 4.0 2.9*  CL 105 105 112*  CO2 24 23 23   GLUCOSE 86 135* 126*  BUN 30* 21 16  CREATININE 0.81 0.61 0.43*  CALCIUM 9.8 9.4 8.2*   Liver Function Tests: No results for input(s): AST, ALT, ALKPHOS, BILITOT, PROT, ALBUMIN in the last 168 hours. No results for input(s): LIPASE, AMYLASE in the last 168 hours. No results for input(s): AMMONIA in the last 168 hours. CBC: Recent Labs  Lab 07/21/17 0907 07/22/17 0500 07/23/17 0541  WBC 5.5 4.1 3.7*  NEUTROABS 2.5  --   --   HGB 10.9* 10.5* 10.2*  HCT 35.8* 33.7* 32.8*  MCV 85.9 84.9 84.3  PLT 356 346 347   Cardiac Enzymes: Recent Labs  Lab 07/21/17 0907 07/22/17 0500 07/23/17 0401  CKTOTAL 2,750* 2,096* 1,552*   BNP: BNP (last 3 results) No results for input(s): BNP in the last 8760 hours.  ProBNP (last 3 results) No results for input(s): PROBNP in the last 8760 hours.  CBG: No results for input(s): GLUCAP in the last 168 hours.     Signed:  Florencia Reasons MD, PhD  [facility] 07/23/2017, 11:27 AM

## 2017-07-24 ENCOUNTER — Encounter: Payer: Self-pay | Admitting: Internal Medicine

## 2017-07-24 ENCOUNTER — Non-Acute Institutional Stay (SKILLED_NURSING_FACILITY): Payer: Medicare Other | Admitting: Internal Medicine

## 2017-07-24 DIAGNOSIS — R918 Other nonspecific abnormal finding of lung field: Secondary | ICD-10-CM | POA: Diagnosis not present

## 2017-07-24 DIAGNOSIS — I63512 Cerebral infarction due to unspecified occlusion or stenosis of left middle cerebral artery: Secondary | ICD-10-CM

## 2017-07-24 DIAGNOSIS — E2740 Unspecified adrenocortical insufficiency: Secondary | ICD-10-CM

## 2017-07-24 DIAGNOSIS — I10 Essential (primary) hypertension: Secondary | ICD-10-CM

## 2017-07-24 DIAGNOSIS — I5032 Chronic diastolic (congestive) heart failure: Secondary | ICD-10-CM | POA: Diagnosis not present

## 2017-07-24 DIAGNOSIS — M3320 Polymyositis, organ involvement unspecified: Secondary | ICD-10-CM | POA: Diagnosis not present

## 2017-07-24 DIAGNOSIS — L89312 Pressure ulcer of right buttock, stage 2: Secondary | ICD-10-CM

## 2017-07-24 DIAGNOSIS — E785 Hyperlipidemia, unspecified: Secondary | ICD-10-CM | POA: Diagnosis not present

## 2017-07-24 DIAGNOSIS — R221 Localized swelling, mass and lump, neck: Secondary | ICD-10-CM

## 2017-07-24 NOTE — Progress Notes (Signed)
:    Location:  [place] Room Number: 213D Place of Service:  SNF (31)  [name] Delaine. Sheppard Coil, MD  Patient Care Team: [name], MD as PCP - General (Internal Medicine)  Extended Emergency Contact Information Primary Emergency Contact: [name] Address: [address]          [name], [name] [name] Phone: [phone] Relation: Daughter Secondary Emergency Contact: [name], [name] Mobile Phone: [phone] Relation: Son     Allergies: Codeine; Hydrocodone; and Tramadol hcl  Chief Complaint  Patient presents with  . New Admit To SNF    Admit to [facility]    HPI: Patient is 77 y.o. female with polymyositis, hyperlipidemia, and gastric leiomyoma who presented to [facility] emergency department with hip pain.  She was hospitalized for a stroke and discharged to CIR on 6/19.  She was then discharged to [facility] for SNF rehab until 7/3.  At that time she went home with her daughter.  This morning she developed abdominal pain without nausea vomiting or diarrhea.  It did not improve with Imodium.  She did have a normal bowel movement yesterday.  She then developed leg pain morning of presentation that starts in her groin and radiates down both thighs.  Daughter reports that mother is requiring total care and they were unable to do that successfully at home.  Patient was admitted to [facility] from 7/9-11 where it was found that she had an elevated CPK so likely the polymyositis causing pain.  Patient is on CellCept, has been on steroids in the past.  Patient improved with IV fluids and is being admitted to skilled nursing facility for OT/PT and possible permanent placement.  While at skilled nursing facility patient will be treated for hypertension treated with atenolol and Lasix, diastolic congestive heart failure treated with Lasix and adrenal insufficiency treated with prednisone.  Past Medical  History:  Diagnosis Date  . Allergic rhinitis, cause unspecified   . Benign neoplasm of colon   . Chronic diastolic CHF (congestive heart failure) ([place])   . CVA (cerebral vascular accident) ([place]) 05/2017  . Esophageal reflux   . Excessive daytime sleepiness   . 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 GI hemorrhage 2011   recurrent  . Hyperlipidemia   . Osteoporosis   . Polymyositis ([place])   . Unspecified essential hypertension   . Vitamin D deficiency     Past Surgical History:  Procedure Laterality Date  . ABDOMINAL HYSTERECTOMY     partial  . BACK SURGERY    . COLONOSCOPY  01/09/2011   others also  . COLONOSCOPY  05/29/2011   Procedure: COLONOSCOPY;  Surgeon: Gatha Mayer, MD;  [facility];  Service: Endoscopy;  Laterality: N/A;  Greggory Brandy Carlean Purl  . COLONOSCOPY WITH PROPOFOL N/A 02/16/2014   Procedure: COLONOSCOPY WITH PROPOFOL;  Surgeon: Milus Banister, MD;  [facility] ENDOSCOPY;  Service: Endoscopy;  Laterality: N/A;  . ESOPHAGOGASTRODUODENOSCOPY  12/08/2010   others also  . ESOPHAGOGASTRODUODENOSCOPY (EGD) WITH PROPOFOL N/A 02/16/2014   Procedure: ESOPHAGOGASTRODUODENOSCOPY (EGD) WITH PROPOFOL;  Surgeon: Milus Banister, MD;  [facility];  Service: Endoscopy;  Laterality: N/A;  . EUS    . HOT HEMOSTASIS  05/29/2011   Procedure: HOT HEMOSTASIS (ARGON PLASMA COAGULATION/BICAP);  Surgeon: Gatha Mayer, MD;  Location: [facility];  Service: Endoscopy;  Laterality: N/A;  . LUMBAR LAMINECTOMY    . ROTATOR CUFF  REPAIR Bilateral   . TONSILLECTOMY  age 40    Allergies as of 07/24/2017      Reactions   Codeine Other (See Comments)   Feels drunk   Hydrocodone    "makes me sleepy"   Tramadol Hcl Other (See Comments)   Makes me feel drunk      Medication List        Accurate as of 07/24/17 12:20 PM. Always use your most recent med list.          acetaminophen 650 MG CR tablet Commonly known as:  TYLENOL 8 HOUR Take 1 tablet (650 mg total) by  mouth every 8 (eight) hours as needed.   allopurinol 100 MG tablet Commonly known as:  ZYLOPRIM Take 1 tablet (100 mg total) by mouth daily.   aspirin 81 MG chewable tablet Chew 1 tablet (81 mg total) by mouth daily.   atenolol 50 MG tablet Commonly known as:  TENORMIN Take 50 mg by mouth daily.   CALTRATE 600 1500 (600 Ca) MG Tabs tablet Generic drug:  calcium carbonate Take 2 tablets by mouth daily.   ferrous sulfate 325 (65 FE) MG tablet Take 325 mg by mouth daily with breakfast.   fluticasone 50 MCG/ACT nasal spray Commonly known as:  FLONASE Place 1 spray into both nostrils daily as needed for allergies.   folic acid 570 MCG tablet Commonly known as:  FOLVITE Take 400 mcg by mouth daily.   furosemide 40 MG tablet Commonly known as:  LASIX Take 1 tablet (40 mg total) by mouth every Monday, Wednesday, and Friday.   gabapentin 100 MG capsule Commonly known as:  NEURONTIN TAKE ONE CAPSULE BY MOUTH TWICE DAILY   mycophenolate 250 MG capsule Commonly known as:  CELLCEPT Take 4 capsules (1,000 mg total) by mouth 2 (two) times daily.   omega-3 acid ethyl esters 1 g capsule Commonly known as:  LOVAZA Take 1 capsule (1 g total) by mouth 2 (two) times daily.   oxyCODONE 5 MG immediate release tablet Commonly known as:  ROXICODONE Take 1 tablet (5 mg total) by mouth every 6 (six) hours as needed for up to 3 days.   pantoprazole 40 MG tablet Commonly known as:  PROTONIX Take 1 tablet (40 mg total) by mouth daily.   potassium chloride SA 20 MEQ tablet Commonly known as:  K-DUR,KLOR-CON Take 2 tablets (40 mEq total) by mouth daily.   predniSONE 20 MG tablet Commonly known as:  DELTASONE Take 1 tablet (20 mg total) by mouth 3 (three) times daily with meals.   senna-docusate 8.6-50 MG tablet Commonly known as:  Senokot-S Take 2 tablets by mouth at bedtime as needed for mild constipation.   vitamin B-12 1000 MCG tablet Commonly known as:  CYANOCOBALAMIN Take 1,000  mcg by mouth daily.   vitamin C 1000 MG tablet Take 1,000 mg by mouth daily.       No orders of the defined types were placed in this encounter.   Immunization History  Administered Date(s) Administered  . Influenza Split 10/07/2011  . Influenza Whole 10/13/2008    Social History   Tobacco Use  . Smoking status: Never Smoker  . Smokeless tobacco: Former Systems developer    Types: Snuff  . Tobacco comment: 07/21/2017 "no snuff  since ~ 04/2017"  Substance Use Topics  . Alcohol use: No    Family history is   Family History  Problem Relation Age of Onset  . Dementia Mother   . Hypertension Father   .  Malignant hyperthermia Father   . Colon cancer Neg Hx       Review of Systems  DATA OBTAINED: from patient, nurse GENERAL:  no fevers, fatigue, appetite changes me:+ Generalized weakness especially bilateral lower extremities SKIN: No itching, or rash EYES: No eye pain, redness, discharge EARS: No earache, tinnitus, change in hearing NOSE: No congestion, drainage or bleeding  MOUTH/THROAT: No mouth or tooth pain, No sore throat RESPIRATORY: No cough, wheezing, SOB CARDIAC: No chest pain, palpitations, lower extremity edema  GI: No abdominal pain, No N/V/D or constipation, No heartburn or reflux  GU: No dysuria, frequency or urgency, or incontinence  MUSCULOSKELETAL: No unrelieved bone/joint pain NEUROLOGIC: No headache, dizziness or focal weakness PSYCHIATRIC: No c/o anxiety or sadness   Vitals:   07/24/17 1206  BP: 130/68  Pulse: 90  Resp: 20  Temp: 98.8 F (37.1 C)    SpO2 Readings from Last 1 Encounters:  07/23/17 100%   Body mass index is 25.51 kg/m.     Physical Exam  GENERAL APPEARANCE: Alert, conversant,  No acute distress.  SKIN: No diaphoresis rash HEAD: Normocephalic, atraumatic  EYES: Conjunctiva/lids clear. Pupils round, reactive. EOMs intact.  EARS: External exam WNL, canals clear. Hearing grossly normal.  NOSE: No deformity or discharge.    MOUTH/THROAT: Lips w/o lesions  RESPIRATORY: Breathing is even, unlabored. Lung sounds are clear   CARDIOVASCULAR: Heart RRR no murmurs, rubs or gallops. No peripheral edema.   GASTROINTESTINAL: Abdomen is soft, non-tender, not distended w/ normal bowel sounds. GENITOURINARY: Bladder non tender, not distended  MUSCULOSKELETAL: Muscle wasting of extremities NEUROLOGIC:  Cranial nerves 2-12 grossly intact. Moves all extremities with decreased strength in all PSYCHIATRIC: Mood and affect at, appropriate to situation, no behavioral issues  Patient Active Problem List   Diagnosis Date Noted  . Pressure injury of skin 07/22/2017  . Neck mass 07/21/2017  . Neuropathic pain   . 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 Neurologic deficit due to acute ischemic stroke ([place])   . Hypokalemia   . CVA (cerebral vascular accident) ([place]) 06/09/2017  . Multiple pulmonary nodules 06/09/2017  . Hypothermia 10/22/2016  . Chronic diastolic CHF (congestive heart failure) ([place]) 10/22/2016  . Abdominal pain, unspecified site 05/17/2013  . Nausea alone 05/17/2013  . 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 Abdominal pain, left lower quadrant 05/28/2011  . Cecal ulcer with hemorrhage 01/10/2011  . Acute blood loss anemia 12/06/2010  . Hematochezia 12/05/2010  . Anemia 12/05/2010  . Adrenal insufficiency ([place]) 12/05/2010  . Osteoporosis 12/05/2010  . LUNG INVOLVEMENT OTHER DISEASES CLASSIFIED ELSW 08/13/2009  . OTHER SPECIFIED DISORDER OF STOMACH AND DUODENUM 07/06/2008  . Essential hypertension 04/26/2007  . ALLERGIC RHINITIS 04/26/2007  . GASTROESOPHAGEAL REFLUX DISEASE 04/26/2007  . Polymyositis ([place]) 04/26/2007  . COLONIC POLYPS, ADENOMATOUS 02/06/2005      Labs reviewed: Basic Metabolic Panel:    Component Value Date/Time   NA 144 07/23/2017 0401   K 2.9 (L) 07/23/2017 0401   CL 112 (H) 07/23/2017 0401   CO2 23 07/23/2017 0401   GLUCOSE 126  (H) 07/23/2017 0401   BUN 16 07/23/2017 0401   CREATININE 0.43 (L) 07/23/2017 0401   CALCIUM 8.2 (L) 07/23/2017 0401   PROT 5.8 (L) 06/15/2017 0809   ALBUMIN 3.2 (L) 06/15/2017 0809   AST 24 06/15/2017 0809   ALT 14 06/15/2017 0809   ALKPHOS 51 06/15/2017 0809   BILITOT 0.9 06/15/2017 0809   GFRNONAA >60 07/23/2017 0401  GFRAA >60 07/23/2017 0401    Recent Labs    06/09/17 1648  07/21/17 0907 07/22/17 0500 07/23/17 0401  NA 140   < > 143 142 144  K 3.6   < > 3.7 4.0 2.9*  CL 104   < > 105 105 112*  CO2 25   < > 24 23 23   GLUCOSE 84   < > 86 135* 126*  BUN 12   < > 30* 21 16  CREATININE 0.42*   < > 0.81 0.61 0.43*  CALCIUM 9.2   < > 9.8 9.4 8.2*  MG 1.6*  --   --   --   --    < > = values in this interval not displayed.   Liver Function Tests: Recent Labs    05/15/17 1048 06/09/17 1648 06/15/17 0809  AST 27 24 24   ALT 17 18 14   ALKPHOS 51 49 51  BILITOT 0.8 0.5 0.9  PROT 6.4* 6.2* 5.8*  ALBUMIN 3.3* 3.6 3.2*   Recent Labs    05/15/17 1048  LIPASE 33   No results for input(s): AMMONIA in the last 8760 hours. CBC: Recent Labs    06/09/17 1648  06/15/17 0809 07/21/17 0907 07/22/17 0500 07/23/17 0541  WBC 7.4   < > 6.1 5.5 4.1 3.7*  NEUTROABS 4.2  --  3.1 2.5  --   --   HGB 9.6*   < > 10.1* 10.9* 10.5* 10.2*  HCT 31.2*   < > 33.3* 35.8* 33.7* 32.8*  MCV 83.9   < > 83.5 85.9 84.9 84.3  PLT 296   < > 278 356 346 347   < > = values in this interval not displayed.   Lipid Recent Labs    06/10/17 0545  CHOL 299*  HDL 66  LDLCALC 164*  TRIG 347*    Cardiac Enzymes: Recent Labs    10/22/16 1600  01/02/17 1415  07/21/17 0907 07/22/17 0500 07/23/17 0401  CKTOTAL  --    < >  --    < > 2,750* 2,096* 1,552*  TROPONINI <0.03  --  <0.03  --   --   --   --    < > = values in this interval not displayed.   BNP: No results for input(s): BNP in the last 8760 hours. No results found for: [facility] Lab Results  Component Value Date   HGBA1C 4.7  (L) 06/10/2017   Lab Results  Component Value Date   TSH 1.080 10/22/2016   Lab Results  Component Value Date   VITAMINB12 >2000 (H) 01/07/2011   Lab Results  Component Value Date   FOLATE >20.0 01/07/2011   Lab Results  Component Value Date   IRON 55 05/30/2011   TIBC 266 05/30/2011   FERRITIN 68 05/30/2011    Imaging and Procedures obtained prior to SNF admission: US Soft Tissue Neck  Result Date: 07/21/2017 CLINICAL DATA:  77 year old female with possible mass in the suprasternal notch. EXAM: ULTRASOUND OF HEAD/NECK SOFT TISSUES TECHNIQUE: Ultrasound examination of the head and neck soft tissues was performed in the area of clinical concern. COMPARISON:  None. FINDINGS: Sonographic interrogation of the region of clinical concern demonstrates no evidence of soft tissue or cystic mass. Incidental imaging of the thyroid gland demonstrates a mildly heterogeneous gland containing tiny subcentimeter colloid nodules which would not meet criteria for further evaluation. The thyroid cartilage is partially calcified. IMPRESSION: Negative sonographic survey of the suprasternal notch. Electronically Signed   By:  Jacqulynn Cadet M.D.   On: 07/21/2017 16:31   Dg Hip Unilat W Or Wo Pelvis 2-3 Views Left  Result Date: 07/21/2017 CLINICAL DATA:  Left hip pain with no known injury EXAM: DG HIP (WITH OR WITHOUT PELVIS) 2-3V LEFT COMPARISON:  Coronal and sagittal reconstructed images through the pelvis and left hip from an abdominal and pelvic CT scan of May 15, 2017. FINDINGS: The bones are subjectively osteopenic. There is no lytic or blastic lesion of the pelvis. There is no acute fracture. AP and lateral views of the left hip reveal preservation of the joint space. The articular surface of the femoral head and acetabulum remains smoothly rounded. The femoral neck, intertrochanteric, and subtrochanteric regions are normal. IMPRESSION: There is no acute or significant chronic bony abnormality of the left  hip. Electronically Signed   By: David  Martinique M.D.   On: 07/21/2017 09:41     Not all labs, radiology exams or other studies done during hospitalization come through on my EPIC note; however they are reviewed by me.    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 patient was admitted to CIR for rehab but at discharge remained at max assist for all ADLs and prompting for problem solving and awareness SNF -to new OT/PT; continue Plavix and ASA recommended for 3 months per neurology on presentation 06/09/2017; statin discontinued  secondary to elevated CK and change to fish oil   Neck mass- palpable in the region of the sternal notch; recent head and neck CT without apparent problem and negative sonography; and problem  Buttocks skin breakdown-stage II SNF -will be followed by wound care  Hypertension SNF - controlled on atenolol 50 mg daily, Lasix Monday Wednesday and Friday  Chronic diastolic congestive heart failure-preserved EF, grade 1 diastolic dysfunction on 3/42: Decreased frequency of Lasix to Monday Wednesday and Friday instead of daily SNF -continue Lasix 40 mg Monday Wednesday Friday  Pulmonary nodules-no history of tobacco use; suggest repeat non-contrast CT in 3 to 6 months  Adrenal insufficiency SNF -continue prednisone 20 mg 3 times daily  Hyperlipidemia SNF -no statins; continue fish oil 1 g twice daily   Time spent greater than 45 minutes;> 50% of time with patient was spent reviewing records, labs, tests and studies,  counseling and developing plan of care  Webb Silversmith D. Sheppard Coil, MD

## 2017-07-26 ENCOUNTER — Encounter: Payer: Self-pay | Admitting: Internal Medicine

## 2017-07-26 DIAGNOSIS — E785 Hyperlipidemia, unspecified: Secondary | ICD-10-CM | POA: Insufficient documentation

## 2017-07-27 ENCOUNTER — Ambulatory Visit: Payer: Medicare Other | Admitting: Adult Health

## 2017-07-28 ENCOUNTER — Ambulatory Visit: Payer: Medicare Other | Admitting: Adult Health

## 2017-07-28 LAB — CBC AND DIFFERENTIAL
HEMATOCRIT: 35 — AB (ref 36–46)
HEMOGLOBIN: 11.1 — AB (ref 12.0–16.0)
Platelets: 359 (ref 150–399)
WBC: 8.9

## 2017-07-28 LAB — BASIC METABOLIC PANEL
BUN: 16 (ref 4–21)
Creatinine: 0.3 — AB (ref 0.5–1.1)
GLUCOSE: 74
POTASSIUM: 3.9 (ref 3.4–5.3)
Sodium: 143 (ref 137–147)

## 2017-07-29 ENCOUNTER — Encounter (HOSPITAL_COMMUNITY): Payer: Medicare Other

## 2017-08-14 ENCOUNTER — Non-Acute Institutional Stay (SKILLED_NURSING_FACILITY): Payer: Medicare Other | Admitting: Internal Medicine

## 2017-08-14 DIAGNOSIS — D649 Anemia, unspecified: Secondary | ICD-10-CM | POA: Diagnosis not present

## 2017-08-14 DIAGNOSIS — I5032 Chronic diastolic (congestive) heart failure: Secondary | ICD-10-CM

## 2017-08-14 DIAGNOSIS — F419 Anxiety disorder, unspecified: Secondary | ICD-10-CM

## 2017-08-15 LAB — CBC AND DIFFERENTIAL
HEMATOCRIT: 36 (ref 36–46)
HEMATOCRIT: 36 (ref 36–46)
HEMOGLOBIN: 11.7 — AB (ref 12.0–16.0)
Hemoglobin: 11.7 — AB (ref 12.0–16.0)
Platelets: 292 (ref 150–399)
Platelets: 292 (ref 150–399)
WBC: 9.1
WBC: 9.1

## 2017-08-15 LAB — BASIC METABOLIC PANEL
BUN: 31 — AB (ref 4–21)
CREATININE: 0.4 — AB (ref 0.5–1.1)
GLUCOSE: 93
POTASSIUM: 4.4 (ref 3.4–5.3)
SODIUM: 141 (ref 137–147)

## 2017-08-16 ENCOUNTER — Encounter: Payer: Self-pay | Admitting: Internal Medicine

## 2017-08-16 NOTE — Progress Notes (Signed)
This is an acute visit.  Level care skilled.  Facility is Doctor, hospital complaint-acute visit secondary to anxiety-  History of present illness  Patient is a 77 year old female with a history of polymyositis as well as hyperlipidemia gastric leiomyoma--as well as a history of a recent CVA.  She also has a history of diastolic CHF as well as hypertension and adrenal insufficiency on chronic prednisone.  Per family she also has a history of anxiety and at one point had been on 0.5 mg of Xanax as needed.  Apparently this was discontinued-I suspect secondary to hospitalizations and some medications probably not being transferred-although the history here is somewhat unclear. has been no apparent history of not tolerating the Xanax  Per nursing and family she does have increased anxiety at times with tossing and turning and family nursing feels she would benefit from reinitiation of the Xanax.  Patient appears to be a poor historian cannot really get any review of systems but she is not complaining of any pain or discomfort but on exam does appear to be somewhat anxious turning about in bed  Vital signs appear to be stable  Past Medical History:  Diagnosis Date  . Allergic rhinitis, cause unspecified   . Benign neoplasm of colon   . Chronic diastolic CHF (congestive heart failure) ([place])   . CVA (cerebral vascular accident) ([place]) 05/2017  . Esophageal reflux   . Excessive daytime sleepiness   . 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 GI hemorrhage 2011   recurrent  . Hyperlipidemia   . Osteoporosis   . Polymyositis ([place])   . Unspecified essential hypertension   . Vitamin D deficiency          Past Surgical History:  Procedure Laterality Date  . ABDOMINAL HYSTERECTOMY     partial  . BACK SURGERY    . COLONOSCOPY  01/09/2011   others also  . COLONOSCOPY  05/29/2011   Procedure: COLONOSCOPY;  Surgeon: Gatha Mayer, MD;  [facility];  Service: Endoscopy;  Laterality: N/A;  Greggory Brandy Carlean Purl  . COLONOSCOPY WITH PROPOFOL N/A 02/16/2014   Procedure: COLONOSCOPY WITH PROPOFOL;  Surgeon: Milus Banister, MD;  [facility];  Service: Endoscopy;  Laterality: N/A;  . ESOPHAGOGASTRODUODENOSCOPY  12/08/2010   others also  . ESOPHAGOGASTRODUODENOSCOPY (EGD) WITH PROPOFOL N/A 02/16/2014   Procedure: ESOPHAGOGASTRODUODENOSCOPY (EGD) WITH PROPOFOL;  Surgeon: Milus Banister, MD;  [facility];  Service: Endoscopy;  Laterality: N/A;  . EUS    . HOT HEMOSTASIS  05/29/2011   Procedure: HOT HEMOSTASIS (ARGON PLASMA COAGULATION/BICAP);  Surgeon: Gatha Mayer, MD;  Location: [facility];  Service: Endoscopy;  Laterality: N/A;  . LUMBAR LAMINECTOMY    . ROTATOR CUFF REPAIR Bilateral   . TONSILLECTOMY  age 50         Allergies as of 07/24/2017      Reactions   Codeine Other (See Comments)   Feels drunk   Hydrocodone    "makes me sleepy"   Tramadol Hcl Other (See Comments)   Makes me feel drunk               Medication List                     acetaminophen 650 MG CR tablet Commonly known as:  TYLENOL 8 HOUR Take 1 tablet (650 mg total) by mouth every 8 (eight) hours as needed.   allopurinol 100 MG  tablet Commonly known as:  ZYLOPRIM Take 1 tablet (100 mg total) by mouth daily.   aspirin 81 MG chewable tablet Chew 1 tablet (81 mg total) by mouth daily.   atenolol 50 MG tablet Commonly known as:  TENORMIN Take 50 mg by mouth daily.   CALTRATE 600 1500 (600 Ca) MG Tabs tablet Generic drug:  calcium carbonate Take 2 tablets by mouth daily.   ferrous sulfate 325 (65 FE) MG tablet Take 325 mg by mouth daily with breakfast.   fluticasone 50 MCG/ACT nasal spray Commonly known as:  FLONASE Place 1 spray into both nostrils daily as needed for allergies.   folic acid 062 MCG tablet Commonly known as:  FOLVITE Take 400 mcg by mouth daily.   furosemide 40  MG tablet Commonly known as:  LASIX Take 1 tablet (40 mg total) by mouth every Monday, Wednesday, and Friday.   gabapentin 100 MG capsule Commonly known as:  NEURONTIN TAKE ONE CAPSULE BY MOUTH TWICE DAILY   mycophenolate 250 MG capsule Commonly known as:  CELLCEPT Take 4 capsules (1,000 mg total) by mouth 2 (two) times daily.   omega-3 acid ethyl esters 1 g capsule Commonly known as:  LOVAZA Take 1 capsule (1 g total) by mouth 2 (two) times daily.   oxyCODONE 5 MG immediate release tablet Commonly known as:  ROXICODONE Take 1 tablet (5 mg total) by mouth every 6 (six) hours as needed for up to 3 days.   pantoprazole 40 MG tablet Commonly known as:  PROTONIX Take 1 tablet (40 mg total) by mouth daily.   potassium chloride SA 20 MEQ tablet Commonly known as:  K-DUR,KLOR-CON Take 2 tablets (40 mEq total) by mouth daily.   predniSONE 20 MG tablet Commonly known as:  DELTASONE Take 1 tablet (20 mg total) by mouth 3 (three) times daily with meals.   senna-docusate 8.6-50 MG tablet Commonly known as:  Senokot-S Take 2 tablets by mouth at bedtime as needed for mild constipation.   vitamin B-12 1000 MCG tablet Commonly known as:  CYANOCOBALAMIN Take 1,000 mcg by mouth daily.   vitamin C 1000 MG tablet Take 1,000 mg by mouth daily.       No orders of the defined types were placed in this encounter.       Immunization History  Administered Date(s) Administered  . Influenza Split 10/07/2011  . Influenza Whole 10/13/2008    Social History        Tobacco Use  . Smoking status: Never Smoker  . Smokeless tobacco: Former Systems developer    Types: Snuff  . Tobacco comment: 07/21/2017 "no snuff  since ~ 04/2017"  Substance Use Topics  . Alcohol use: No    Family history is        Family History  Problem Relation Age of Onset  . Dementia Mother   . Hypertension Father   . Malignant hyperthermia Father   . Colon cancer Neg Hx        Review  of systems-is largely unobtainable patient appears to be a poor historian per nursing they do not report any acute issues other than they have noted some increased anxiety    Exam.  There is 98.4 pulse 64 respirations 18 blood pressure 127/68 saturation is in the 90s on room air.  In general this is an elderly female does not appear to be in any distress she is lying in bed- she tends to lean a bit to the right- is not new and may  be the result of some anxiety turning around in bed.  Her skin is warm and dry.  Eyes visual acuity appears to be intact.  Oropharynx is clear mucous membranes moist.  Chest is clear to auscultation there is no labored breathing.  Heart is regular rate and rhythm without murmur gallop or rub.  Abdomen is soft nontender with positive bowel sounds.  Musculoskeletal Limited exam since she is in bed but appears able to move her upper extremities as well as lower although she does have appears some wasting of her lower extremities with I suspect chronic weakness.  Neurologic she is alert cranial nerves appear to be intact she does speak some with generalized weakness   Psych she is oriented to self-responds appropriately to verbal commands  Labs.  July 28, 2017.  Sodium 143 potassium 3.9 BUN 16 creatinine 0.3.  WBC 8.9 hemoglobin 11.1 platelets 359   Assessment and plan.  1.  Anxiety- as noted above she does have a history of being on Xanax we will start her on a 0.25 mg 1 tab twice daily as needed anxiety and monitor-we will do a 14-day course- will monitor if not effective possibly increased back to her baseline dose of 0.5  #2 I note a history of diastolic CHF she is on Lasixwith potassium  will update a metabolic   panel to keep an eye on her electrolytes and renal function     #3-anemia-she continues on iron as -- last hemoglobin was 11.1 in mid July-this appears baseline with recent values-we will update this as  well   [id number]

## 2017-08-17 ENCOUNTER — Inpatient Hospital Stay (HOSPITAL_COMMUNITY): Payer: Medicare Other

## 2017-08-17 ENCOUNTER — Inpatient Hospital Stay (HOSPITAL_COMMUNITY)
Admission: EM | Admit: 2017-08-17 | Discharge: 2017-09-09 | DRG: 377 | Disposition: A | Discharge status: Skilled Nursing Facility | Payer: Medicare Other | Attending: Internal Medicine | Admitting: Internal Medicine

## 2017-08-17 ENCOUNTER — Emergency Department (HOSPITAL_COMMUNITY): Payer: Medicare Other

## 2017-08-17 ENCOUNTER — Encounter (HOSPITAL_COMMUNITY): Payer: Self-pay

## 2017-08-17 ENCOUNTER — Encounter (HOSPITAL_COMMUNITY)
Admission: EM | Disposition: A | Discharge status: Skilled Nursing Facility | Payer: Self-pay | Source: Home / Self Care | Attending: Internal Medicine

## 2017-08-17 DIAGNOSIS — J9383 Other pneumothorax: Secondary | ICD-10-CM | POA: Diagnosis not present

## 2017-08-17 DIAGNOSIS — E785 Hyperlipidemia, unspecified: Secondary | ICD-10-CM | POA: Diagnosis present

## 2017-08-17 DIAGNOSIS — I959 Hypotension, unspecified: Secondary | ICD-10-CM | POA: Diagnosis present

## 2017-08-17 DIAGNOSIS — J982 Interstitial emphysema: Secondary | ICD-10-CM | POA: Diagnosis present

## 2017-08-17 DIAGNOSIS — K922 Gastrointestinal hemorrhage, unspecified: Secondary | ICD-10-CM | POA: Diagnosis not present

## 2017-08-17 DIAGNOSIS — J939 Pneumothorax, unspecified: Secondary | ICD-10-CM

## 2017-08-17 DIAGNOSIS — Z885 Allergy status to narcotic agent status: Secondary | ICD-10-CM

## 2017-08-17 DIAGNOSIS — Z9689 Presence of other specified functional implants: Secondary | ICD-10-CM

## 2017-08-17 DIAGNOSIS — Z7952 Long term (current) use of systemic steroids: Secondary | ICD-10-CM

## 2017-08-17 DIAGNOSIS — K921 Melena: Secondary | ICD-10-CM | POA: Diagnosis present

## 2017-08-17 DIAGNOSIS — K219 Gastro-esophageal reflux disease without esophagitis: Secondary | ICD-10-CM | POA: Diagnosis present

## 2017-08-17 DIAGNOSIS — I5032 Chronic diastolic (congestive) heart failure: Secondary | ICD-10-CM | POA: Diagnosis present

## 2017-08-17 DIAGNOSIS — R4182 Altered mental status, unspecified: Secondary | ICD-10-CM | POA: Diagnosis not present

## 2017-08-17 DIAGNOSIS — I110 Hypertensive heart disease with heart failure: Secondary | ICD-10-CM | POA: Diagnosis present

## 2017-08-17 DIAGNOSIS — I319 Disease of pericardium, unspecified: Secondary | ICD-10-CM | POA: Diagnosis present

## 2017-08-17 DIAGNOSIS — D62 Acute posthemorrhagic anemia: Secondary | ICD-10-CM | POA: Diagnosis present

## 2017-08-17 DIAGNOSIS — Z993 Dependence on wheelchair: Secondary | ICD-10-CM

## 2017-08-17 DIAGNOSIS — T884XXA Failed or difficult intubation, initial encounter: Secondary | ICD-10-CM | POA: Diagnosis present

## 2017-08-17 DIAGNOSIS — R0902 Hypoxemia: Secondary | ICD-10-CM

## 2017-08-17 DIAGNOSIS — J9690 Respiratory failure, unspecified, unspecified whether with hypoxia or hypercapnia: Secondary | ICD-10-CM

## 2017-08-17 DIAGNOSIS — R0600 Dyspnea, unspecified: Secondary | ICD-10-CM

## 2017-08-17 DIAGNOSIS — K5731 Diverticulosis of large intestine without perforation or abscess with bleeding: Secondary | ICD-10-CM | POA: Diagnosis present

## 2017-08-17 DIAGNOSIS — R578 Other shock: Secondary | ICD-10-CM | POA: Diagnosis present

## 2017-08-17 DIAGNOSIS — M3320 Polymyositis, organ involvement unspecified: Secondary | ICD-10-CM | POA: Diagnosis present

## 2017-08-17 DIAGNOSIS — I2510 Atherosclerotic heart disease of native coronary artery without angina pectoris: Secondary | ICD-10-CM | POA: Diagnosis present

## 2017-08-17 DIAGNOSIS — Z7901 Long term (current) use of anticoagulants: Secondary | ICD-10-CM | POA: Diagnosis not present

## 2017-08-17 DIAGNOSIS — R55 Syncope and collapse: Secondary | ICD-10-CM | POA: Diagnosis not present

## 2017-08-17 DIAGNOSIS — G9341 Metabolic encephalopathy: Secondary | ICD-10-CM | POA: Diagnosis present

## 2017-08-17 DIAGNOSIS — J9601 Acute respiratory failure with hypoxia: Secondary | ICD-10-CM | POA: Diagnosis not present

## 2017-08-17 DIAGNOSIS — J9600 Acute respiratory failure, unspecified whether with hypoxia or hypercapnia: Secondary | ICD-10-CM

## 2017-08-17 DIAGNOSIS — K625 Hemorrhage of anus and rectum: Secondary | ICD-10-CM

## 2017-08-17 DIAGNOSIS — R29724 NIHSS score 24: Secondary | ICD-10-CM | POA: Diagnosis present

## 2017-08-17 DIAGNOSIS — G9340 Encephalopathy, unspecified: Secondary | ICD-10-CM | POA: Diagnosis not present

## 2017-08-17 DIAGNOSIS — I639 Cerebral infarction, unspecified: Secondary | ICD-10-CM | POA: Diagnosis present

## 2017-08-17 DIAGNOSIS — Z79899 Other long term (current) drug therapy: Secondary | ICD-10-CM

## 2017-08-17 DIAGNOSIS — R1310 Dysphagia, unspecified: Secondary | ICD-10-CM

## 2017-08-17 DIAGNOSIS — I6300 Cerebral infarction due to thrombosis of unspecified precerebral artery: Secondary | ICD-10-CM | POA: Diagnosis not present

## 2017-08-17 DIAGNOSIS — Z7189 Other specified counseling: Secondary | ICD-10-CM | POA: Diagnosis not present

## 2017-08-17 DIAGNOSIS — R627 Adult failure to thrive: Secondary | ICD-10-CM | POA: Diagnosis present

## 2017-08-17 DIAGNOSIS — Z8709 Personal history of other diseases of the respiratory system: Secondary | ICD-10-CM

## 2017-08-17 DIAGNOSIS — R5381 Other malaise: Secondary | ICD-10-CM | POA: Diagnosis present

## 2017-08-17 DIAGNOSIS — E870 Hyperosmolality and hypernatremia: Secondary | ICD-10-CM | POA: Diagnosis present

## 2017-08-17 DIAGNOSIS — Z6824 Body mass index (BMI) 24.0-24.9, adult: Secondary | ICD-10-CM | POA: Diagnosis not present

## 2017-08-17 DIAGNOSIS — J690 Pneumonitis due to inhalation of food and vomit: Secondary | ICD-10-CM | POA: Diagnosis not present

## 2017-08-17 DIAGNOSIS — Z7401 Bed confinement status: Secondary | ICD-10-CM

## 2017-08-17 DIAGNOSIS — K5521 Angiodysplasia of colon with hemorrhage: Secondary | ICD-10-CM | POA: Diagnosis present

## 2017-08-17 DIAGNOSIS — R10819 Abdominal tenderness, unspecified site: Secondary | ICD-10-CM

## 2017-08-17 DIAGNOSIS — J95811 Postprocedural pneumothorax: Secondary | ICD-10-CM | POA: Diagnosis not present

## 2017-08-17 DIAGNOSIS — I69351 Hemiplegia and hemiparesis following cerebral infarction affecting right dominant side: Secondary | ICD-10-CM | POA: Diagnosis not present

## 2017-08-17 DIAGNOSIS — Z515 Encounter for palliative care: Secondary | ICD-10-CM | POA: Diagnosis not present

## 2017-08-17 DIAGNOSIS — Z4659 Encounter for fitting and adjustment of other gastrointestinal appliance and device: Secondary | ICD-10-CM

## 2017-08-17 DIAGNOSIS — M810 Age-related osteoporosis without current pathological fracture: Secondary | ICD-10-CM | POA: Diagnosis present

## 2017-08-17 DIAGNOSIS — F0150 Vascular dementia without behavioral disturbance: Secondary | ICD-10-CM | POA: Diagnosis present

## 2017-08-17 DIAGNOSIS — Z66 Do not resuscitate: Secondary | ICD-10-CM | POA: Diagnosis present

## 2017-08-17 DIAGNOSIS — Z0189 Encounter for other specified special examinations: Secondary | ICD-10-CM

## 2017-08-17 DIAGNOSIS — Z7982 Long term (current) use of aspirin: Secondary | ICD-10-CM | POA: Diagnosis not present

## 2017-08-17 DIAGNOSIS — E46 Unspecified protein-calorie malnutrition: Secondary | ICD-10-CM | POA: Diagnosis present

## 2017-08-17 DIAGNOSIS — D72829 Elevated white blood cell count, unspecified: Secondary | ICD-10-CM

## 2017-08-17 DIAGNOSIS — Z87891 Personal history of nicotine dependence: Secondary | ICD-10-CM

## 2017-08-17 DIAGNOSIS — E2740 Unspecified adrenocortical insufficiency: Secondary | ICD-10-CM | POA: Diagnosis present

## 2017-08-17 DIAGNOSIS — E876 Hypokalemia: Secondary | ICD-10-CM | POA: Diagnosis not present

## 2017-08-17 DIAGNOSIS — Z9289 Personal history of other medical treatment: Secondary | ICD-10-CM

## 2017-08-17 DIAGNOSIS — J9602 Acute respiratory failure with hypercapnia: Secondary | ICD-10-CM | POA: Diagnosis not present

## 2017-08-17 HISTORY — PX: ESOPHAGOGASTRODUODENOSCOPY: SHX5428

## 2017-08-17 LAB — PROTIME-INR
INR: 0.94
Prothrombin Time: 12.5 seconds (ref 11.4–15.2)

## 2017-08-17 LAB — URINALYSIS, ROUTINE W REFLEX MICROSCOPIC
BILIRUBIN URINE: NEGATIVE
GLUCOSE, UA: NEGATIVE mg/dL
HGB URINE DIPSTICK: NEGATIVE
KETONES UR: NEGATIVE mg/dL
Leukocytes, UA: NEGATIVE
NITRITE: NEGATIVE
PH: 5 (ref 5.0–8.0)
PROTEIN: NEGATIVE mg/dL
Specific Gravity, Urine: >1.046 — ABNORMAL HIGH (ref 1.005–1.030)

## 2017-08-17 LAB — COMPREHENSIVE METABOLIC PANEL
ALBUMIN: 3.2 g/dL — AB (ref 3.5–5.0)
ALK PHOS: 43 U/L (ref 38–126)
ALT: 18 U/L (ref 0–44)
ANION GAP: 10 (ref 5–15)
AST: 16 U/L (ref 15–41)
BILIRUBIN TOTAL: 0.8 mg/dL (ref 0.3–1.2)
BUN: 33 mg/dL — AB (ref 8–23)
CHLORIDE: 107 mmol/L (ref 98–111)
CO2: 23 mmol/L (ref 22–32)
Calcium: 9 mg/dL (ref 8.9–10.3)
Creatinine, Ser: 0.62 mg/dL (ref 0.44–1.00)
GFR calc Af Amer: >60 mL/min (ref >60)
GFR calc non Af Amer: >60 mL/min (ref >60)
Glucose, Bld: 83 mg/dL (ref 70–99)
POTASSIUM: 4.2 mmol/L (ref 3.5–5.1)
Sodium: 140 mmol/L (ref 135–145)
TOTAL PROTEIN: 5.7 g/dL — AB (ref 6.5–8.1)

## 2017-08-17 LAB — CBC
HEMATOCRIT: 33.9 % — AB (ref 36.0–46.0)
HEMATOCRIT: 41.4 % (ref 36.0–46.0)
HEMOGLOBIN: 10.5 g/dL — AB (ref 12.0–15.0)
HEMOGLOBIN: 13.4 g/dL (ref 12.0–15.0)
MCH: 26.3 pg (ref 26.0–34.0)
MCH: 26.2 pg (ref 26.0–34.0)
MCHC: 31 g/dL (ref 30.0–36.0)
MCHC: 32.4 g/dL (ref 30.0–36.0)
MCV: 84.8 fL (ref 78.0–100.0)
MCV: 80.9 fL (ref 78.0–100.0)
Platelets: 277 10*3/uL (ref 150–400)
Platelets: 232 10*3/uL (ref 150–400)
RBC: 4 MIL/uL (ref 3.87–5.11)
RBC: 5.12 MIL/uL — AB (ref 3.87–5.11)
RDW: 18.2 % — AB (ref 11.5–15.5)
RDW: 16.9 % — ABNORMAL HIGH (ref 11.5–15.5)
WBC: 9.6 10*3/uL (ref 4.0–10.5)
WBC: 24.1 10*3/uL — ABNORMAL HIGH (ref 4.0–10.5)

## 2017-08-17 LAB — POCT I-STAT 3, ART BLOOD GAS (G3+)
Acid-base deficit: 4 mmol/L — ABNORMAL HIGH (ref 0.0–2.0)
Bicarbonate: 21.2 mmol/L (ref 20.0–28.0)
O2 Saturation: 100 %
PCO2 ART: 38.8 mmHg (ref 32.0–48.0)
Patient temperature: 97.8
TCO2: 22 mmol/L (ref 22–32)
pH, Arterial: 7.343 — ABNORMAL LOW (ref 7.350–7.450)
pO2, Arterial: 211 mmHg — ABNORMAL HIGH (ref 83.0–108.0)

## 2017-08-17 LAB — PROCALCITONIN: PROCALCITONIN: 0.27 ng/mL

## 2017-08-17 LAB — MRSA PCR SCREENING: MRSA by PCR: NEGATIVE

## 2017-08-17 LAB — LACTIC ACID, PLASMA: Lactic Acid, Venous: 2.8 mmol/L (ref 0.5–1.9)

## 2017-08-17 LAB — PREPARE RBC (CROSSMATCH)

## 2017-08-17 LAB — ABO/RH: ABO/RH(D): O POS

## 2017-08-17 LAB — GLUCOSE, CAPILLARY
GLUCOSE-CAPILLARY: 154 mg/dL — AB (ref 70–99)
Glucose-Capillary: 99 mg/dL (ref 70–99)
Glucose-Capillary: 164 mg/dL — ABNORMAL HIGH (ref 70–99)

## 2017-08-17 LAB — CORTISOL: Cortisol, Plasma: 53.8 ug/dL

## 2017-08-17 SURGERY — EGD (ESOPHAGOGASTRODUODENOSCOPY)
Anesthesia: Moderate Sedation

## 2017-08-17 MED ORDER — EPINEPHRINE PF 1 MG/10ML IJ SOSY
PREFILLED_SYRINGE | INTRAMUSCULAR | Status: AC
  Filled 2017-08-17: qty 10

## 2017-08-17 MED ORDER — MORPHINE SULFATE (PF) 4 MG/ML IV SOLN: 2.0000 mg | INTRAVENOUS | Status: DC | PRN

## 2017-08-17 MED ORDER — ACETAMINOPHEN 650 MG RE SUPP: 650.0000 mg | Freq: Four times a day (QID) | RECTAL | Status: DC | PRN

## 2017-08-17 MED ORDER — FENTANYL CITRATE (PF) 100 MCG/2ML IJ SOLN
INTRAMUSCULAR | Status: DC | PRN
  Administered 2017-08-17 (×2): 25 ug via INTRAVENOUS

## 2017-08-17 MED ORDER — HYDROCORTISONE NA SUCCINATE PF 100 MG IJ SOLR
50.0000 mg | Freq: Four times a day (QID) | INTRAMUSCULAR | Status: DC
Start: 2017-08-17 — End: 2017-08-19
  Administered 2017-08-17 – 2017-08-19 (×8): 50 mg via INTRAVENOUS
  Filled 2017-08-17 (×8): qty 2

## 2017-08-17 MED ORDER — CHLORHEXIDINE GLUCONATE 0.12% ORAL RINSE (MEDLINE KIT)
15.0000 mL | Freq: Two times a day (BID) | OROMUCOSAL | Status: DC
  Administered 2017-08-17 – 2017-08-20 (×6): 15 mL via OROMUCOSAL

## 2017-08-17 MED ORDER — MIDAZOLAM HCL 10 MG/2ML IJ SOLN
INTRAMUSCULAR | Status: DC | PRN
  Administered 2017-08-17 (×2): 2 mg via INTRAVENOUS

## 2017-08-17 MED ORDER — FENTANYL CITRATE (PF) 100 MCG/2ML IJ SOLN
100.0000 ug | Freq: Once | INTRAMUSCULAR | Status: AC
  Administered 2017-08-17: 100 ug via INTRAVENOUS

## 2017-08-17 MED ORDER — ONDANSETRON HCL 4 MG/2ML IJ SOLN: 4.0000 mg | Freq: Four times a day (QID) | INTRAMUSCULAR | Status: DC | PRN

## 2017-08-17 MED ORDER — MIDAZOLAM HCL 2 MG/2ML IJ SOLN
INTRAMUSCULAR | Status: AC
  Filled 2017-08-17: qty 2

## 2017-08-17 MED ORDER — FENTANYL CITRATE (PF) 100 MCG/2ML IJ SOLN
INTRAMUSCULAR | Status: AC
Start: 2017-08-17 — End: 2017-08-17
  Administered 2017-08-17: 50 ug via INTRAVENOUS
  Filled 2017-08-17: qty 2

## 2017-08-17 MED ORDER — FENTANYL CITRATE (PF) 100 MCG/2ML IJ SOLN
50.0000 ug | INTRAMUSCULAR | Status: AC | PRN
  Administered 2017-08-17 (×3): 50 ug via INTRAVENOUS
  Filled 2017-08-17 (×3): qty 2

## 2017-08-17 MED ORDER — MIDAZOLAM HCL 2 MG/2ML IJ SOLN
2.0000 mg | Freq: Once | INTRAMUSCULAR | Status: AC
  Administered 2017-08-17: 2 mg via INTRAVENOUS

## 2017-08-17 MED ORDER — METOPROLOL TARTRATE 5 MG/5ML IV SOLN: 2.5000 mg | Freq: Four times a day (QID) | INTRAVENOUS | Status: DC | PRN

## 2017-08-17 MED ORDER — IOPAMIDOL (ISOVUE-370) INJECTION 76%
INTRAVENOUS | Status: AC
  Filled 2017-08-17: qty 100

## 2017-08-17 MED ORDER — SODIUM CHLORIDE 0.9% IV SOLUTION
Freq: Once | INTRAVENOUS | Status: AC
  Administered 2017-08-17: 20:00:00 via INTRAVENOUS

## 2017-08-17 MED ORDER — SODIUM CHLORIDE 0.9% FLUSH
3.0000 mL | Freq: Two times a day (BID) | INTRAVENOUS | Status: DC
  Administered 2017-08-17 – 2017-08-23 (×8): 3 mL via INTRAVENOUS
  Administered 2017-08-24: 10 mL via INTRAVENOUS
  Administered 2017-08-25 – 2017-09-08 (×12): 3 mL via INTRAVENOUS

## 2017-08-17 MED ORDER — ORAL CARE MOUTH RINSE
15.0000 mL | OROMUCOSAL | Status: DC
  Administered 2017-08-17 – 2017-08-20 (×26): 15 mL via OROMUCOSAL

## 2017-08-17 MED ORDER — MIDAZOLAM HCL 2 MG/2ML IJ SOLN
1.0000 mg | INTRAMUSCULAR | Status: DC | PRN
  Filled 2017-08-17 (×2): qty 2

## 2017-08-17 MED ORDER — SODIUM CHLORIDE 0.9 % IV SOLN
INTRAVENOUS | Status: DC
  Administered 2017-08-17 – 2017-08-19 (×7): via INTRAVENOUS
  Administered 2017-08-20: 125 mL/h via INTRAVENOUS

## 2017-08-17 MED ORDER — MIDAZOLAM HCL 5 MG/ML IJ SOLN
INTRAMUSCULAR | Status: AC
  Filled 2017-08-17: qty 3

## 2017-08-17 MED ORDER — FENTANYL CITRATE (PF) 100 MCG/2ML IJ SOLN
50.0000 ug | INTRAMUSCULAR | Status: DC | PRN
  Administered 2017-08-18 (×3): 50 ug via INTRAVENOUS
  Filled 2017-08-17 (×3): qty 2

## 2017-08-17 MED ORDER — FENTANYL CITRATE (PF) 100 MCG/2ML IJ SOLN
50.0000 ug | Freq: Once | INTRAMUSCULAR | Status: AC
  Administered 2017-08-17: 50 ug via INTRAVENOUS

## 2017-08-17 MED ORDER — SODIUM CHLORIDE 0.9% IV SOLUTION
Freq: Once | INTRAVENOUS | Status: AC
  Administered 2017-08-17: 14:00:00 via INTRAVENOUS

## 2017-08-17 MED ORDER — MIDAZOLAM HCL 2 MG/2ML IJ SOLN
1.0000 mg | INTRAMUSCULAR | Status: DC | PRN
  Administered 2017-08-17 – 2017-08-19 (×6): 1 mg via INTRAVENOUS
  Filled 2017-08-17 (×6): qty 2

## 2017-08-17 MED ORDER — SUCCINYLCHOLINE CHLORIDE 20 MG/ML IJ SOLN
20.0000 mg | Freq: Once | INTRAMUSCULAR | Status: AC
  Administered 2017-08-17: 20 mg via INTRAVENOUS
  Filled 2017-08-17: qty 1

## 2017-08-17 MED ORDER — IOPAMIDOL (ISOVUE-370) INJECTION 76%
INTRAVENOUS | Status: AC
  Administered 2017-08-17: 100 mL
  Filled 2017-08-17: qty 100

## 2017-08-17 MED ORDER — IOPAMIDOL (ISOVUE-370) INJECTION 76%
100.0000 mL | Freq: Once | INTRAVENOUS | Status: AC | PRN
  Administered 2017-08-17: 100 mL via INTRAVENOUS

## 2017-08-17 MED ORDER — ETOMIDATE 2 MG/ML IV SOLN
12.0000 mg | Freq: Once | INTRAVENOUS | Status: AC
  Administered 2017-08-17: 12 mg via INTRAVENOUS

## 2017-08-17 MED ORDER — ONDANSETRON HCL 4 MG PO TABS: 4.0000 mg | ORAL_TABLET | Freq: Four times a day (QID) | ORAL | Status: DC | PRN

## 2017-08-17 MED ORDER — DIPHENHYDRAMINE HCL 50 MG/ML IJ SOLN
INTRAMUSCULAR | Status: AC
  Filled 2017-08-17: qty 1

## 2017-08-17 MED ORDER — SODIUM CHLORIDE 0.9 % IV SOLN
INTRAVENOUS | Status: DC | PRN
  Administered 2017-08-19: 03:00:00 via INTRA_ARTERIAL

## 2017-08-17 MED ORDER — SODIUM CHLORIDE 0.9 % IV BOLUS
1000.0000 mL | Freq: Once | INTRAVENOUS | Status: AC
  Administered 2017-08-17: 1000 mL via INTRAVENOUS

## 2017-08-17 MED ORDER — SODIUM CHLORIDE 0.9 % IV SOLN
8.0000 mg/h | INTRAVENOUS | Status: DC
  Administered 2017-08-17 (×2): 8 mg/h via INTRAVENOUS
  Filled 2017-08-17 (×5): qty 80

## 2017-08-17 MED ORDER — SPOT INK MARKER SYRINGE KIT
PACK | SUBMUCOSAL | Status: AC
  Filled 2017-08-17: qty 5

## 2017-08-17 MED ORDER — FENTANYL CITRATE (PF) 100 MCG/2ML IJ SOLN
INTRAMUSCULAR | Status: AC
  Filled 2017-08-17: qty 4

## 2017-08-17 MED ORDER — SODIUM CHLORIDE 0.9 % IV SOLN
Freq: Once | INTRAVENOUS | Status: AC
  Administered 2017-08-17: 08:00:00 via INTRAVENOUS

## 2017-08-17 MED ORDER — ACETAMINOPHEN 325 MG PO TABS
650.0000 mg | ORAL_TABLET | Freq: Four times a day (QID) | ORAL | Status: DC | PRN
  Administered 2017-08-20: 650 mg via ORAL
  Filled 2017-08-17: qty 2

## 2017-08-17 NOTE — ED Notes (Signed)
Iv team at bedside to access port

## 2017-08-17 NOTE — ED Notes (Signed)
bp became unresponsive again, bp 49/31, pt placed in trendelenburg, nrb mask remained on patient. Pt not responding to verbal or painful stimuli. Carotid pulse and spontaneous respirations present.

## 2017-08-17 NOTE — Assessment & Plan Note (Signed)
Polymyositis with recent flare on immunosuppressant medication.  On examination: Generally decreased muscle bulk.  Strength examination is unreliable given patient's mental status.  Assessment and plan: Hold immunosuppressive medications while remains hemodynamically unstable.  Continue coverage with stress steroids.  Avoid neuromuscular blockade for intubation to prevent prolonged weakness.

## 2017-08-17 NOTE — Assessment & Plan Note (Signed)
On chronic prednisone for polymyositis.  She carries a diagnosis of related adrenal insufficiency.   BMP Latest Ref Rng & Units 08/17/2017 08/15/2017 07/28/2017  Glucose 70 - 99 mg/dL 83 - -  BUN 8 - 23 mg/dL 33(H) 31(A) 16  Creatinine 0.44 - 1.00 mg/dL 0.62 0.4(A) 0.3(A)  Sodium 135 - 145 mmol/L 140 141 143  Potassium 3.5 - 5.1 mmol/L 4.2 4.4 3.9  Chloride 98 - 111 mmol/L 107 - -  CO2 22 - 32 mmol/L 23 - -  Calcium 8.9 - 10.3 mg/dL 9.0 - -   Assessment and plan: Adrenal insufficiency likely contributing to hypotension and confusion.  Will treat with stress dose steroids.  Once hemodynamically stable can be transitioned back to home steroid dose.

## 2017-08-17 NOTE — ED Notes (Signed)
Patient taken to CT by this RN.

## 2017-08-17 NOTE — ED Notes (Signed)
Arbie Quill, Neuro PA at bedside to assess patient, advised this RN to call a code stroke at this time due to patient's symptoms.

## 2017-08-17 NOTE — Op Note (Signed)
[facility] Patient Name: [name] [name] Procedure Date : 08/17/2017 MRN: [id number] Attending MD: Jackquline Denmark , MD Date of Birth: [birth year] CSN: [id number] Age: 77 Admit Type: Inpatient Procedure:                Upper GI endoscopy Indications:              Gastrointestinal bleeding of unknown origin Providers:                Jackquline Denmark, MD, Kingsley Plan, RN, Elspeth Cho, Technician Referring MD:              Medicines:                Monitored Anesthesia Care in ICU (pt intubated) Complications:            No immediate complications. Estimated Blood Loss:     Estimated blood loss: none. Procedure:                Pre-Anesthesia Assessment:                           - Prior to the procedure, a History and Physical                            was performed, and patient medications and                            allergies were reviewed. The patient's tolerance of                            previous anesthesia was also reviewed. The risks                            and benefits of the procedure and the sedation                            options and risks were discussed with the patient.                            All questions were answered, and informed consent                            was obtained. Prior Anticoagulants: The patient                            last took aspirin 1 day and Plavix (clopidogrel) 1                            day prior to the procedure. ASA Grade Assessment:                            IV - A patient with severe systemic disease that is  a constant threat to life. After reviewing the                            risks and benefits, the patient was deemed in                            satisfactory condition to undergo the procedure.                           After obtaining informed consent, the endoscope was                            passed under direct vision. Throughout the                   procedure, the patient's blood pressure, pulse, and                            oxygen saturations were monitored continuously. The                            [id number]-H190 ([id number]) Olympus Adult EGD was introduced                            through the mouth, and advanced to the second part                            of duodenum. The upper GI endoscopy was                            accomplished without difficulty. The patient                            tolerated the procedure well. Scope In: Scope Out: Findings:      The esophagus was normal.      The stomach was normal.      The examined duodenum was normal. Impression:               - No Active UGI bleeding Moderate Sedation:      none Recommendation:           - CTA stat followed by colonoscopy if needed.                           - Discussed with family in detail. Procedure Code(s):        --- Professional ---                           [phone], Esophagogastroduodenoscopy, flexible,                            transoral; diagnostic, including collection of                            specimen(s) by brushing or washing, when performed                            (  separate procedure) Diagnosis Code(s):        --- Professional ---                           K92.2, Gastrointestinal hemorrhage, unspecified CPT copyright 2017 American Medical Association. All rights reserved. The codes documented in this report are preliminary and upon coder review may  be revised to meet current compliance requirements. Jackquline Denmark, MD 08/17/2017 6:11:15 PM This report has been signed electronically. Number of Addenda: 0

## 2017-08-17 NOTE — Assessment & Plan Note (Signed)
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  Was now wheelchair bound in a nursing home.  At baseline however she was awake and conversant until this morning.  Today she was found to be unresponsive and only moaning incomprehensibly.    On examination: Elderly frail looking woman eyes closed moaning incomprehensibly.  Generalized weakness.  Chest is clear to auscultation bilaterally.  Assessment and plan: Acute encephalopathy likely secondary to hypotension in the context of prior cerebrovascular disease.  Treatment is hemodynamic resuscitation and correction of gastrointestinal hemorrhage.  She will need airway stabilization prior to upper endoscopy.

## 2017-08-17 NOTE — Procedures (Signed)
Chest Tube Insertion Procedure Note  Indications:  Clinically significant Pneumothorax  Pre-operative Diagnosis: Pneumothorax L  Post-operative Diagnosis: Pneumothorax L  Procedure Details  Informed consent was obtained for the procedure, including sedation.  Risks of lung perforation, hemorrhage, arrhythmia, and adverse drug reaction were discussed.   After sterile skin prep, using Seldinger technique a Wayne pneumothorax tube was placed in the left lateral 5th rib space.  Findings: None  Estimated Blood Loss:  less than 50 mL         Specimens:  n/a              Complications:  None; patient tolerated the procedure well.         Disposition: ICU - intubated and critically ill.         Condition: stable  Georgann Housekeeper, AGACNP-BC [facility] Pulmonology/Critical Care Pager [phone] or [phone]  08/17/2017 8:19 PM

## 2017-08-17 NOTE — ED Notes (Addendum)
Patient's daughter came to the nurses station stating patient was no longer responding to verbal stimuli and was breathing heavy, upon this RN entering room, pt was not responding to verbal or painful stimuli, faint carotid pulse present but bp noted to be extremely low in the 60's. Dr. Lacinda Axon called to bedside to assess patient. zoll pads and NRB mask placed on patient. Pt started to respond to verbal painful stimuli after approx 2 mins.

## 2017-08-17 NOTE — Progress Notes (Signed)
CRITICAL VALUE ALERT  Critical Value:  Lactic 2.8  Date & Time Notied: 08/17/2017 2145    Provider Notified: Panchal  Orders Received/Actions taken: Repeat Lactic in 4 hours

## 2017-08-17 NOTE — ED Notes (Signed)
Blood consent form completed, signed by patient's daughter/caretaker and placed at bedside

## 2017-08-17 NOTE — Progress Notes (Addendum)
Critical Care Consultation Note  Reason for consultation: Hypotension  Reason for admission: Confusion and blood per rectum  Date of admission: August 17, 2017  History of present illness and course in hospital:  This is a 77 year old chronically ill woman who presents with altered mental status and passage of blood per rectum of acute onset.  There is been no observed emesis.  Because of episodes of hypotension in the ED critical care was consulted.  She has been seen by gastroenterology who plans to perform an upper endoscopy.  Past Medical History:  Diagnosis Date  . Allergic rhinitis, cause unspecified   . Benign neoplasm of colon   . Chronic diastolic CHF (congestive heart failure) ([place])   . CVA (cerebral vascular accident) ([place]) 05/2017  . Esophageal reflux   . Excessive daytime sleepiness   . 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 GI hemorrhage 2011   recurrent  . Hyperlipidemia   . Osteoporosis   . Polymyositis ([place])   . Unspecified essential hypertension   . Vitamin D deficiency    Past Surgical History:  Procedure Laterality Date  . ABDOMINAL HYSTERECTOMY     partial  . BACK SURGERY    . COLONOSCOPY  01/09/2011   others also  . COLONOSCOPY  05/29/2011   Procedure: COLONOSCOPY;  Surgeon: Gatha Mayer, MD;  Location: [facility];  Service: Endoscopy;  Laterality: N/A;  Greggory Brandy Carlean Purl  . COLONOSCOPY WITH PROPOFOL N/A 02/16/2014   Procedure: COLONOSCOPY WITH PROPOFOL;  Surgeon: Milus Banister, MD;  Location: [facility];  Service: Endoscopy;  Laterality: N/A;  . ESOPHAGOGASTRODUODENOSCOPY  12/08/2010   others also  . ESOPHAGOGASTRODUODENOSCOPY (EGD) WITH PROPOFOL N/A 02/16/2014   Procedure: ESOPHAGOGASTRODUODENOSCOPY (EGD) WITH PROPOFOL;  Surgeon: Milus Banister, MD;  Location: [facility];  Service: Endoscopy;  Laterality: N/A;  . EUS    . HOT HEMOSTASIS  05/29/2011   Procedure: HOT HEMOSTASIS (ARGON PLASMA COAGULATION/BICAP);  Surgeon: Gatha Mayer, MD;   Location: [facility];  Service: Endoscopy;  Laterality: N/A;  . LUMBAR LAMINECTOMY    . ROTATOR CUFF REPAIR Bilateral   . TONSILLECTOMY  age 68   Review of Systems  Unable to perform ROS: Critical illness  According to family, the patient had been her usual state of health.  Scheduled Meds: . sodium chloride   Intravenous Once  . hydrocortisone sod succinate (SOLU-CORTEF) inj  50 mg Intravenous Q6H  . sodium chloride flush  3 mL Intravenous Q12H   Continuous Infusions: . sodium chloride    . sodium chloride    . pantoprozole (PROTONIX) infusion 8 mg/hr (08/17/17 1247)   PRN Meds:Place/Maintain arterial line **AND** sodium chloride, acetaminophen **OR** acetaminophen, ondansetron **OR** ondansetron (ZOFRAN) IV  Vital signs in last 24 hours: Temp:  [96.6 F (35.9 C)-97.7 F (36.5 C)] 96.6 F (35.9 C) (08/05 0923) Pulse Rate:  [50-98] 83 (08/05 0923) Resp:  [17-24] 19 (08/05 1000) BP: (81-169)/(43-102) 85/43 (08/05 1230) SpO2:  [89 %-100 %] 100 % (08/05 0923) Weight:  [140 lb (63.5 kg)] 140 lb (63.5 kg) (08/05 0636)  Intake/Output last 3 shifts: No intake/output data recorded. Intake/Output this shift: No intake/output data recorded.  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  Was now wheelchair bound in a nursing home.  At baseline however she was awake and conversant until this morning.  Today she was found to be unresponsive and only moaning incomprehensibly.  On examination: Elderly frail looking woman eyes closed moaning incomprehensibly.  Generalized weakness.  Chest is clear to auscultation bilaterally.  Assessment and plan: Acute encephalopathy likely secondary to hypotension in the context of prior cerebrovascular disease.  Treatment is hemodynamic resuscitation and correction of gastrointestinal hemorrhage.  She will need airway stabilization  prior to upper endoscopy.    Digestive Acute upper GI bleed Assessment & Plan Patient has a prior history of ulcer disease treated many years ago.  Unheralded onset of epigastric pain and altered mental status with passage initially of bright red blood per rectum.  On examination: The patient appears to be moaning and winces in pain but her abdomen is soft to palpation.  There is possibly some discomfort on palpation of the right lower quadrant.  Bowel sounds are active.  She has passed a large quantity of black stool.  Assessment and plan: Acute gastrointestinal bleed likely above the ligament of Treitz.  Gastroenterology plans to perform an upper endoscopy.  Pantoprazole infusion initiated.  Endocrine Adrenal insufficiency (HCC) Assessment & Plan On chronic prednisone for polymyositis.  She carries a diagnosis of related adrenal insufficiency.   BMP Latest Ref Rng & Units 08/17/2017 08/15/2017 07/28/2017  Glucose 70 - 99 mg/dL 83 - -  BUN 8 - 23 mg/dL 33(H) 31(A) 16  Creatinine 0.44 - 1.00 mg/dL 0.62 0.4(A) 0.3(A)  Sodium 135 - 145 mmol/L 140 141 143  Potassium 3.5 - 5.1 mmol/L 4.2 4.4 3.9  Chloride 98 - 111 mmol/L 107 - -  CO2 22 - 32 mmol/L 23 - -  Calcium 8.9 - 10.3 mg/dL 9.0 - -   Assessment and plan: Adrenal insufficiency likely contributing to hypotension and confusion.  Will treat with stress dose steroids.  Once hemodynamically stable can be transitioned back to home steroid dose.   Musculoskeletal and Integument Polymyositis ([place]) Assessment & Plan Polymyositis with recent flare on immunosuppressant medication.  On examination: Generally decreased muscle bulk.  Strength examination is unreliable given patient's mental status.  Assessment and plan: Hold immunosuppressive medications while remains hemodynamically unstable.  Continue coverage with stress steroids.  Avoid neuromuscular blockade for intubation to prevent prolonged weakness.  Other Nontraumatic hemorrhagic  shock ([place]) Assessment & Plan Episodes of hypotension with systolics down to the 87F with passage of bright red blood per rectum.  Initially, blood pressure would recover 643 systolic.  However blood pressures remained more consistently low with persistently altered mental status.  On examination: Patient is confused and moans incomprehensibly.  Her extremities are cool and her capillary refill is poor.  JVP is flat.  Heart sounds are unremarkable.  There is no peripheral edema.  She has 2+ radial pulses.  Assessment and plan: Hemorrhagic shock secondary to acute GI bleed.  Treat with fluid resuscitation using blood products preferentially in a balanced fashion.  Acute blood loss anemia Assessment & Plan CBC Latest Ref Rng & Units 08/17/2017 08/15/2017 08/15/2017  WBC 4.0 - 10.5 K/uL 9.6 9.1 9.1  Hemoglobin 12.0 - 15.0 g/dL 10.5(L) 11.7(A) 11.7(A)  Hematocrit 36.0 - 46.0 % 33.9(L) 36 36  Platelets 150 - 400 K/uL 277 292 292   Currently ordered for 2 units of PRBC.  Assessment and Plan:  Follow serial CBC.  Transfuse for ongoing blood loss and hypotension.  Balanced blood products 4:1:1.   CRITICAL CARE Performed by: [name]   Total critical care time: 40 minutes  Critical care time was exclusive of separately billable procedures and treating other patients.  Critical care was  necessary to treat or prevent imminent or life-threatening deterioration.  Critical care was time spent personally by me on the following activities: development of treatment plan with patient and/or surrogate as well as nursing, discussions with consultants, evaluation of patient's response to treatment, examination of patient, obtaining history from patient or surrogate, ordering and performing treatments and interventions, ordering and review of laboratory studies, ordering and review of radiographic studies, pulse oximetry and re-evaluation of patient's condition.  [name], MD [facility] ICU Physician [place]  Pager: [phone] Mobile: [phone] After hours: [phone].

## 2017-08-17 NOTE — Progress Notes (Signed)
3yoF with hx Polymyositis (on Prednisone 20mg  BID and Cellcept chronically), CVA (on ASA and Plavix), Gastric leiomyoma, HTN, GERD, and dCHF, who presented to ER this AM with BRBPR that began acutely this AM, episodes of hypotension and unresponsiveness in the ER, required intubation and received 2u pRBC per RN. However Hgb returned as 10.5, down from 11.7 when last checked 8/3, with baseline ranging from 10.1-11.7. Initial concern was that she was in hemorrhagic shock and that her Hgb had not reequilibrated yet. She received 2u pRBC with Hgb rising from 10.5 to 13.4. This would seem to indicate she is not actively bleeding. RN reports no further bleeding since arrival to ICU. EGD showed no signs of bleeding. And CTA Abdomen showed no active bleeding source. On my exam she is intubated and sedated, mucous membranes moist, lungs CTA b/l, Right chest port in place, Abdomen soft and nondistended, no LE edema, foley with clear yellow urine, no sputum on suctioning of ETT.   CXR post intubation revealed a left-sided pneumothorax, for which left-sided pigtail chest tube is now being placed. Given that initial reports also noted "heavy breathing," do question if the pneumothorax was the true etiology of her hypotension. However, initial CXR shows only tiny pneumothorax, now larger on CTA. Therefore do not think she likely had tension physiology at least not at first.   She is chronically immunosuppressed and as such is at increased risk for infections as well as adrenal insufficiency. She remains afebrile but may not be able to mount an immune response if she is immunocompromised. She does have a leukocytosis of 24k although that is nonspecific. Will check procal, lactate, cortisol, and obtain pancultures. Hold off on empiric antibiotics unless lab results provide more convincing evidence of infxn. Continue empiric hydrocortisone. Continue checking Hgb q6hrs. Given that she is on ASA and Plavix at home, must assume  her platelets are inhibited. Will transfuse 1u Plt now.   60 minutes nonprocedural critical care time  Vernie Murders, MD Pulmonary & Critical Care Medicine Pager: [phone]

## 2017-08-17 NOTE — Procedures (Signed)
Arterial Catheter Insertion Procedure Note [name] [name] [id number] [birth year]  Procedure: Insertion of Arterial Catheter  Indications: Blood pressure monitoring and Frequent blood sampling  Procedure Details Consent: Risks of procedure as well as the alternatives and risks of each were explained to the (patient/caregiver).  Consent for procedure obtained. Time Out: Verified patient identification, verified procedure, site/side was marked, verified correct patient position, special equipment/implants available, medications/allergies/relevent history reviewed, required imaging and test results available.  Performed  Maximum sterile technique was used including antiseptics, cap, gloves, gown, hand hygiene, mask and sheet. Skin prep: Chlorhexidine; local anesthetic administered 22 gauge catheter was inserted into right radial artery using the Seldinger technique. ULTRASOUND GUIDANCE USED: NO Evaluation Blood flow good; BP tracing good. Complications: No apparent complications.   Kelle Darting 08/17/2017

## 2017-08-17 NOTE — Assessment & Plan Note (Signed)
Episodes of hypotension with systolics down to the 41H with passage of bright red blood per rectum.  Initially, blood pressure would recover 464 systolic.  However blood pressures remained more consistently low with persistently altered mental status.  On examination: Patient is confused and moans incomprehensibly.  Her extremities are cool and her capillary refill is poor.  JVP is flat.  Heart sounds are unremarkable.  There is no peripheral edema.  She has 2+ radial pulses.  Assessment and plan: Hemorrhagic shock secondary to acute GI bleed.  Treat with fluid resuscitation using blood products preferentially in a balanced fashion.

## 2017-08-17 NOTE — Assessment & Plan Note (Signed)
Patient has a prior history of ulcer disease treated many years ago.  Unheralded onset of epigastric pain and altered mental status with passage initially of bright red blood per rectum.  On examination: The patient appears to be moaning and winces in pain but her abdomen is soft to palpation.  There is possibly some discomfort on palpation of the right lower quadrant.  Bowel sounds are active.  She has passed a large quantity of black stool.  Assessment and plan: Acute gastrointestinal bleed likely above the ligament of Treitz.  Gastroenterology plans to perform an upper endoscopy.  Pantoprazole infusion initiated.

## 2017-08-17 NOTE — ED Notes (Signed)
Upon this this RN and Dr. Lacinda Axon rolling patient to obtain occult stool card, frank red blood noted in patient's brief.

## 2017-08-17 NOTE — Assessment & Plan Note (Signed)
CBC Latest Ref Rng & Units 08/17/2017 08/15/2017 08/15/2017  WBC 4.0 - 10.5 K/uL 9.6 9.1 9.1  Hemoglobin 12.0 - 15.0 g/dL 10.5(L) 11.7(A) 11.7(A)  Hematocrit 36.0 - 46.0 % 33.9(L) 36 36  Platelets 150 - 400 K/uL 277 292 292   Currently ordered for 2 units of PRBC.  Assessment and Plan:  Follow serial CBC.  Transfuse for ongoing blood loss and hypotension.  Balanced blood products 4:1:1.

## 2017-08-17 NOTE — Progress Notes (Signed)
Pt experiencing decreased BP with PRN fentanyl push. Will continue to monitor.

## 2017-08-17 NOTE — Consult Note (Addendum)
Neurology Consultation  Reason for Consult: Sudden onset of altered mental status Referring Physician: [name]  History is obtained from: Chart and nurse  HPI: [name] [name] is a 77 y.o. 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  Patient was brought from [facility] to the emergency department this a.m. with acute GI bleed on aspirin and Plavix.  She had an episode of hypotension/unresponsive in the ED when her blood pressure dropped to systolically in the 60A.  Per nurse patient at that time regained consciousness and was talking however suddenly her blood pressure dropped to systolically 40 and again became unresponsive.  At that time I walked into the room patient had left gaze deviation only responded to noxious stimuli was not moving any extremities and when I say responding to noxious stimuli I meant grimacing of the face.  Did not blink to threat.  Due to the sudden onset of altered mental status in the setting of hypotension there was concern for possible large vessel stenosis versus occlusion.  Patient was immediately brought to the CT suite where she obtained a CTA of the head and neck due to the fact that she was not a TPA candidate as she has a GI bleed.  CTA did not show any large vessel occlusion.     LKW: 08/17/2017 at 9 AM tpa given?: no, GI bleed Premorbid modified Rankin scale (mRS): 4 Stroke comorbidities-hyperlipidemia NIHSS 24  ROS: Unable to obtain due to altered mental status.   Past Medical History:  Diagnosis Date  . Allergic rhinitis, cause unspecified   . Benign neoplasm of colon   . Chronic diastolic CHF (congestive heart failure) ([place])   . CVA (cerebral vascular accident) ([place]) 05/2017  . Esophageal reflux   . Excessive daytime sleepiness   . 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 GI hemorrhage 2011   recurrent  . Hyperlipidemia   . Osteoporosis   . Polymyositis ([place])   . Unspecified essential hypertension   . Vitamin D deficiency     Family History  Problem Relation Age of Onset  . Dementia Mother   . Hypertension Father   . Malignant hyperthermia Father   . Colon cancer Neg Hx     Social History:   reports that she has never smoked. She has quit using smokeless tobacco. Her smokeless tobacco use included snuff. She reports that she does not drink alcohol or use drugs.  Medications  Current Facility-Administered Medications:  .  0.9 %  sodium chloride infusion (Manually program via Guardrails IV Fluids), , Intravenous, Once, Karmen Bongo, MD .  0.9 %  sodium chloride infusion, , Intravenous, Continuous, Karmen Bongo, MD .  Place/Maintain arterial line, , , Until Discontinued **AND** 0.9 %  sodium chloride infusion, , Intra-arterial, PRN, [name], MD .  acetaminophen (TYLENOL) tablet 650 mg, 650 mg, Oral, Q6H PRN **OR** acetaminophen (TYLENOL) suppository 650 mg, 650 mg, Rectal, Q6H PRN, Karmen Bongo, MD .  hydrocortisone sodium succinate (SOLU-CORTEF) 100 MG injection 50 mg, 50 mg, Intravenous, Q6H, Karmen Bongo, MD .  ondansetron (ZOFRAN) tablet 4 mg, 4 mg, Oral, Q6H PRN **OR** ondansetron (ZOFRAN) injection 4 mg, 4 mg, Intravenous, Q6H PRN, Karmen Bongo, MD .  pantoprazole (PROTONIX) 80 mg in sodium chloride 0.9 % 250 mL (0.32 mg/mL) infusion, 8 mg/hr, Intravenous, Continuous, Karmen Bongo, MD .  sodium chloride flush (NS)  0.9 % injection 3 mL, 3 mL, Intravenous, Q12H, Karmen Bongo, MD  Current Outpatient Medications:  .  acetaminophen (TYLENOL 8 HOUR) 650 MG CR tablet, Take 1 tablet (650 mg total) by mouth every 8 (eight) hours as needed. (Patient taking differently: Take 650 mg by mouth every 8 (eight) hours as needed for pain. ), Disp: 30 tablet, Rfl: 0 .  allopurinol (ZYLOPRIM) 100 MG tablet, Take 1 tablet (100 mg total) by mouth daily., Disp: 30  tablet, Rfl: 0 .  ALPRAZolam (XANAX) 0.25 MG tablet, Take 0.25 mg by mouth 2 (two) times daily as needed for anxiety (for 14 days.)., Disp: , Rfl:  .  aspirin 81 MG chewable tablet, Chew 1 tablet (81 mg total) by mouth daily., Disp: , Rfl:  .  atenolol (TENORMIN) 50 MG tablet, Take 50 mg by mouth daily.  , Disp: , Rfl:  .  Calcium Carbonate (CALCIUM 600 PO), Take 1,200 mg by mouth daily., Disp: , Rfl:  .  clopidogrel (PLAVIX) 75 MG tablet, Take 75 mg by mouth daily., Disp: , Rfl:  .  ferrous sulfate 325 (65 FE) MG tablet, Take 325 mg by mouth daily with breakfast., Disp: , Rfl:  .  fluticasone (FLONASE) 50 MCG/ACT nasal spray, Place 1 spray into both nostrils daily as needed for allergies. , Disp: , Rfl:  .  folic acid (FOLVITE) 160 MCG tablet, Take 400 mcg by mouth daily.  , Disp: , Rfl:  .  furosemide (LASIX) 40 MG tablet, Take 1 tablet (40 mg total) by mouth every Monday, Wednesday, and Friday., Disp: 30 tablet, Rfl: 0 .  gabapentin (NEURONTIN) 100 MG capsule, TAKE ONE CAPSULE BY MOUTH TWICE DAILY, Disp: 30 capsule, Rfl: 0 .  mycophenolate (CELLCEPT) 250 MG capsule, Take 4 capsules (1,000 mg total) by mouth 2 (two) times daily., Disp: 60 capsule, Rfl: 0 .  omega-3 acid ethyl esters (LOVAZA) 1 g capsule, Take 1 capsule (1 g total) by mouth 2 (two) times daily., Disp: 30 capsule, Rfl: 0 .  oxyCODONE (OXY IR/ROXICODONE) 5 MG immediate release tablet, Take 5 mg by mouth 2 (two) times daily as needed for severe pain., Disp: , Rfl:  .  pantoprazole (PROTONIX) 40 MG tablet, Take 1 tablet (40 mg total) by mouth daily. (Patient taking differently: Take 40 mg by mouth daily. Do not crush), Disp: , Rfl:  .  potassium chloride SA (K-DUR,KLOR-CON) 20 MEQ tablet, Take 2 tablets (40 mEq total) by mouth daily., Disp: , Rfl:  .  predniSONE (DELTASONE) 20 MG tablet, Take 1 tablet (20 mg total) by mouth 3 (three) times daily with meals. (Patient taking differently: Take 20 mg by mouth 2 (two) times daily. ), Disp:  90 tablet, Rfl: 0 .  senna-docusate (SENOKOT-S) 8.6-50 MG tablet, Take 2 tablets by mouth at bedtime as needed for mild constipation., Disp: , Rfl:  .  vitamin B-12 (CYANOCOBALAMIN) 1000 MCG tablet, Take 1,000 mcg by mouth daily.  , Disp: , Rfl:  .  vitamin C (ASCORBIC ACID) 500 MG tablet, Take 1,000 mg by mouth daily. , Disp: , Rfl:    Exam: Current vital signs: BP (!) 148/83   Pulse 83   Temp (!) 96.6 F (35.9 C) (Axillary)   Resp 19   Ht 5\' 2"  (1.575 m)   Wt 63.5 kg (140 lb)   SpO2 100%   BMI 25.61 kg/m  Vital signs in last 24 hours: Temp:  [96.6 F (35.9 C)-97.7 F (36.5 C)] 96.6 F (35.9 C) (08/05  6546) Pulse Rate:  [50-98] 83 (08/05 0923) Resp:  [17-24] 19 (08/05 1000) BP: (81-169)/(57-102) 148/83 (08/05 1130) SpO2:  [89 %-100 %] 100 % (08/05 0923) Weight:  [63.5 kg (140 lb)] 63.5 kg (140 lb) (08/05 0636)  GENERAL: Significantly somnolent and unresponsive HEENT: - Normocephalic and atraumatic, dry mm,  Ext: warm, well perfused, intact peripheral pulses, __ edema  NEURO:  Mental Status: Somnolent and nonresponsive other than to noxious stimuli.  Nonverbal  other than moaning cranial Nerves: PERRL 2 mm and reactive.  Left gaze preference but blink to threat bilaterally  Motor: From previous stroke she was weaker on the right than the left.  Patient did not move significantly spontaneously but she did wiggle her fingers, was able to lift her thumb as a thumbs up however I am not sure if this was due to response to question versus just spontaneous.  Only grimace to pain without movement of limbs. Tone: is normal and bulk is normal Sensation-responded to noxious stimuli Coordination: Unable to perform Gait- deferred     Labs I have reviewed labs in epic and the results pertinent to this consultation are:   CBC    Component Value Date/Time   WBC 9.6 08/17/2017 0638   RBC 4.00 08/17/2017 0638   HGB 10.5 (L) 08/17/2017 0638   HCT 33.9 (L) 08/17/2017 0638   PLT 277  08/17/2017 0638   MCV 84.8 08/17/2017 0638   MCH 26.3 08/17/2017 0638   MCHC 31.0 08/17/2017 0638   RDW 18.2 (H) 08/17/2017 0638   LYMPHSABS 2.0 07/21/2017 0907   MONOABS 0.5 07/21/2017 0907   EOSABS 0.4 07/21/2017 0907   BASOSABS 0.1 07/21/2017 0907    CMP     Component Value Date/Time   NA 140 08/17/2017 0638   NA 141 08/15/2017   K 4.2 08/17/2017 0638   CL 107 08/17/2017 0638   CO2 23 08/17/2017 0638   GLUCOSE 83 08/17/2017 0638   BUN 33 (H) 08/17/2017 0638   BUN 31 (A) 08/15/2017   CREATININE 0.62 08/17/2017 0638   CALCIUM 9.0 08/17/2017 0638   PROT 5.7 (L) 08/17/2017 0638   ALBUMIN 3.2 (L) 08/17/2017 0638   AST 16 08/17/2017 0638   ALT 18 08/17/2017 0638   ALKPHOS 43 08/17/2017 0638   BILITOT 0.8 08/17/2017 0638   GFRNONAA >60 08/17/2017 0638   GFRAA >60 08/17/2017 0638    Lipid Panel     Component Value Date/Time   CHOL 299 (H) 06/10/2017 0545   TRIG 347 (H) 06/10/2017 0545   HDL 66 06/10/2017 0545   CHOLHDL 4.5 06/10/2017 0545   VLDL 69 (H) 06/10/2017 0545   LDLCALC 164 (H) 06/10/2017 0545     Imaging I have reviewed the images obtained:  CTA -scan of the brain-- IMPRESSION: 1. No emergent large vessel occlusion or other acute finding. Stable from 06/09/2017. 2. Extensive atherosclerotic irregularity and narrowing of intracranial medium size vessels. 3. No significant atherosclerosis or stenosis in the neck.   I have seen the patient and reviewed the above note.     Assessment: 77 year old female with significant GI bleed and loss of blood.  Patient had 2 episodes in which she became severely hypotensive and unresponsive.  Though possible that she extended her stroke in the setting of severe hypotension, I do not think that typical ischemic stroke risk factors are at play, just hypoperfusion.   If she does stabilize and has persistent deficits, could get MRI brain to assess for stroke extension.  Recommendations: -Avoid hypotension -MRI  brain when stable, please call neurology  If positive for acute stroke.  -GI to evaluate and treat GI bleed - please call with any further questions or  Concerns.   Roland Rack, MD [facility] [phone]  If 7pm- 7am, please page neurology on call as listed in [place].

## 2017-08-17 NOTE — ED Triage Notes (Signed)
Patient [name] [name] for rectal bleeding since this morning. EMS reports per facility patient had a bowel movement this morning with bright red blood in stool; unknown amount. Patient currently resides at [facility] for CVA, she has bilateral arm weakness. She is able to walk with a walker. A&O x 4. Patient also reports a history of rectal bleeding.   BP 121/61 HR 85 O2 96%

## 2017-08-17 NOTE — Consult Note (Addendum)
Referring Provider: [facility]    Primary Care Physician:  [name] Redwood, MD Primary Gastroenterologist:  [name] Cota, MD  Reason for Consultation:  GI bleeding     ASSESSMENT AND PLAN:    64.  77 yo female with acute GI bleeding on plavix and asa. Passing dark bloody stool (but taking iron) at present but initially bright red according to chart documentation. With rising BUN, periods of hemodynamic instability, and abdominal tenderness we need to rule out upper source of bleed as soon as possible   -PCCM at bedside. Plan is to move patient to ICU then intubate for EGD today. If EGD negative then will need CT angio vrs bleeding scan, +/- colonoscopy depending on clinical course and imaging. She does have history of an oozing ulcerated cecal AVM several years ago. Even on Plavix and aspirin the amount of bleeding seems out of proportion to an AVM bleed however. If comes to a colonoscopy any therapeutic intervention such as cauterization will be complicated by the plavix she has on board -continue IV PPI  -monitor hgb, transfuse as needed  2. Polymyositis. Reason for Cellcept?   Attending physician's note   I have taken an interval history, reviewed the chart and examined the patient. I agree with the Advanced Practitioner's note, impression and recommendations.   77 year old with hemorrhagic shock with significant GI bleed on aspirin/Plavix requiring IV fluids, intubation, blood transfusion.  Plan: Emergent EGD, IV Protonix, if negative, proceed with CTA possibly followed by a colonoscopy if needed.  Patient has guarded prognosis.  I have extensively discussed with the family (over 36 family members).  [name] Austria, MD   HPI: [name] [name] is a 77 y.o. female    Patient is a 81 [name] with polymyositis, CAD, chronic diastolic heart failure, and hypertension. She is known to Dr. Ardis [name] and has a history of adenomatous colon polyps as well as a gastric leiomyoma followed, and  unchanged at the time of last EGD February 2016.  She also has a history of an oozing, ulcerated cecal AVM requiring cautery in 2013.   Patient was brought from [facility] to ED this am with acute GI bleeding on aspirin and plavix.  Son said she was fine last night when he talked to her then family received a call from rehab that patient started passing blood bright red blood her rectum and she was transferring to ED . In ED she had a couple of episode of hypotension / unresponsiveness . She was sent for head CT which was negative for anything acute. Patient is still unable to provide me with any history.  Medication list from [employer] [facility] [facility] includes aspirin and Plavix.  She is on Protonix as well.  Hemoglobin at baseline on arrival which was 11.7.  She did decline to 10.5.  Given the rapidity of the bleed, a blood transfusion was ordered and she has so far received 1 unit.  Family unaware of any recent abdominal pain, nausea or vomiting that patient may have had.    Past Medical History:  Diagnosis Date  . Allergic rhinitis, cause unspecified   . Benign neoplasm of colon   . Chronic diastolic CHF (congestive heart failure) ([place])   . CVA (cerebral vascular accident) ([place]) 05/2017  . Esophageal reflux   . Excessive daytime sleepiness   . 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 GI hemorrhage 2011   recurrent  . Hyperlipidemia   . Osteoporosis   . Polymyositis (  [place])   . Unspecified essential hypertension   . Vitamin D deficiency     Past Surgical History:  Procedure Laterality Date  . ABDOMINAL HYSTERECTOMY     partial  . BACK SURGERY    . COLONOSCOPY  01/09/2011   others also  . COLONOSCOPY  05/29/2011   Procedure: COLONOSCOPY;  Surgeon: Gatha Mayer, MD;  Location: [facility];  Service: Endoscopy;  Laterality: N/A;  Greggory Brandy Carlean Purl  . COLONOSCOPY WITH PROPOFOL N/A 02/16/2014   Procedure: COLONOSCOPY WITH PROPOFOL;  Surgeon: Milus Banister, MD;  Location: [facility];   Service: Endoscopy;  Laterality: N/A;  . ESOPHAGOGASTRODUODENOSCOPY  12/08/2010   others also  . ESOPHAGOGASTRODUODENOSCOPY (EGD) WITH PROPOFOL N/A 02/16/2014   Procedure: ESOPHAGOGASTRODUODENOSCOPY (EGD) WITH PROPOFOL;  Surgeon: Milus Banister, MD;  Location: [facility];  Service: Endoscopy;  Laterality: N/A;  . EUS    . HOT HEMOSTASIS  05/29/2011   Procedure: HOT HEMOSTASIS (ARGON PLASMA COAGULATION/BICAP);  Surgeon: Gatha Mayer, MD;  Location: [facility];  Service: Endoscopy;  Laterality: N/A;  . LUMBAR LAMINECTOMY    . ROTATOR CUFF REPAIR Bilateral   . TONSILLECTOMY  age 26    Prior to Admission medications   Medication Sig Start Date End Date Taking? Authorizing Provider  acetaminophen (TYLENOL 8 HOUR) 650 MG CR tablet Take 1 tablet (650 mg total) by mouth every 8 (eight) hours as needed. Patient taking differently: Take 650 mg by mouth every 8 (eight) hours as needed for pain.  05/15/17  Yes Varney Biles, MD  allopurinol (ZYLOPRIM) 100 MG tablet Take 1 tablet (100 mg total) by mouth daily. 07/23/17 08/22/17 Yes Florencia Reasons, MD  ALPRAZolam Duanne Moron) 0.25 MG tablet Take 0.25 mg by mouth 2 (two) times daily as needed for anxiety (for 14 days.). 08/15/17 08/29/17 Yes [provider]  aspirin 81 MG chewable tablet Chew 1 tablet (81 mg total) by mouth daily. 06/12/17  Yes Caren Griffins, MD  atenolol (TENORMIN) 50 MG tablet Take 50 mg by mouth daily.     Yes [provider]  Calcium Carbonate (CALCIUM 600 PO) Take 1,200 mg by mouth daily.   Yes [provider]  clopidogrel (PLAVIX) 75 MG tablet Take 75 mg by mouth daily.   Yes [provider]  ferrous sulfate 325 (65 FE) MG tablet Take 325 mg by mouth daily with breakfast.   Yes [provider]  fluticasone (FLONASE) 50 MCG/ACT nasal spray Place 1 spray into both nostrils daily as needed for allergies.  06/05/17  Yes [provider]  folic acid (FOLVITE) 093 MCG tablet Take 400 mcg by mouth  daily.     Yes [provider]  furosemide (LASIX) 40 MG tablet Take 1 tablet (40 mg total) by mouth every Monday, Wednesday, and Friday. 07/24/17  Yes Florencia Reasons, MD  gabapentin (NEURONTIN) 100 MG capsule TAKE ONE CAPSULE BY MOUTH TWICE DAILY 04/14/17  Yes Regal, Tamala Fothergill, DPM  mycophenolate (CELLCEPT) 250 MG capsule Take 4 capsules (1,000 mg total) by mouth 2 (two) times daily. 07/23/17  Yes Florencia Reasons, MD  omega-3 acid ethyl esters (LOVAZA) 1 g capsule Take 1 capsule (1 g total) by mouth 2 (two) times daily. 07/23/17  Yes Florencia Reasons, MD  oxyCODONE (OXY IR/ROXICODONE) 5 MG immediate release tablet Take 5 mg by mouth 2 (two) times daily as needed for severe pain.   Yes [provider]  pantoprazole (PROTONIX) 40 MG tablet Take 1 tablet (40 mg total) by mouth daily.  Patient taking differently: Take 40 mg by mouth daily. Do not crush 06/25/17  Yes Love, Ivan Anchors, PA-C  potassium chloride SA (K-DUR,KLOR-CON) 20 MEQ tablet Take 2 tablets (40 mEq total) by mouth daily. 06/25/17  Yes Love, Ivan Anchors, PA-C  predniSONE (DELTASONE) 20 MG tablet Take 1 tablet (20 mg total) by mouth 3 (three) times daily with meals. Patient taking differently: Take 20 mg by mouth 2 (two) times daily.  07/23/17 08/22/17 Yes Florencia Reasons, MD  senna-docusate (SENOKOT-S) 8.6-50 MG tablet Take 2 tablets by mouth at bedtime as needed for mild constipation. 06/24/17  Yes Love, Ivan Anchors, PA-C  vitamin B-12 (CYANOCOBALAMIN) 1000 MCG tablet Take 1,000 mcg by mouth daily.     Yes [provider]  vitamin C (ASCORBIC ACID) 500 MG tablet Take 1,000 mg by mouth daily.    Yes [provider]    Current Facility-Administered Medications  Medication Dose Route Frequency Provider Last Rate Last Dose  . 0.9 %  sodium chloride infusion (Manually program via Guardrails IV Fluids)   Intravenous Once Karmen Bongo, MD      . 0.9 %  sodium chloride infusion   Intravenous Continuous Karmen Bongo, MD      . acetaminophen  (TYLENOL) tablet 650 mg  650 mg Oral Q6H PRN Karmen Bongo, MD       Or  . acetaminophen (TYLENOL) suppository 650 mg  650 mg Rectal Q6H PRN Karmen Bongo, MD      . hydrocortisone sodium succinate (SOLU-CORTEF) 100 MG injection 50 mg  50 mg Intravenous Q6H Karmen Bongo, MD      . metoprolol tartrate (LOPRESSOR) injection 2.5 mg  2.5 mg Intravenous Q6H PRN Karmen Bongo, MD      . morphine 4 MG/ML injection 2 mg  2 mg Intravenous Q2H PRN Karmen Bongo, MD      . ondansetron [facility]) tablet 4 mg  4 mg Oral Q6H PRN Karmen Bongo, MD       Or  . ondansetron [facility]) injection 4 mg  4 mg Intravenous Q6H PRN Karmen Bongo, MD      . pantoprazole (PROTONIX) 80 mg in sodium chloride 0.9 % 250 mL (0.32 mg/mL) infusion  8 mg/hr Intravenous Continuous Karmen Bongo, MD      . sodium chloride flush (NS) 0.9 % injection 3 mL  3 mL Intravenous Q12H Karmen Bongo, MD       Current Outpatient Medications  Medication Sig Dispense Refill  . acetaminophen (TYLENOL 8 HOUR) 650 MG CR tablet Take 1 tablet (650 mg total) by mouth every 8 (eight) hours as needed. (Patient taking differently: Take 650 mg by mouth every 8 (eight) hours as needed for pain. ) 30 tablet 0  . allopurinol (ZYLOPRIM) 100 MG tablet Take 1 tablet (100 mg total) by mouth daily. 30 tablet 0  . ALPRAZolam (XANAX) 0.25 MG tablet Take 0.25 mg by mouth 2 (two) times daily as needed for anxiety (for 14 days.).    Marland Kitchen aspirin 81 MG chewable tablet Chew 1 tablet (81 mg total) by mouth daily.    Marland Kitchen atenolol (TENORMIN) 50 MG tablet Take 50 mg by mouth daily.      . Calcium Carbonate (CALCIUM 600 PO) Take 1,200 mg by mouth daily.    . clopidogrel (PLAVIX) 75 MG tablet Take 75 mg by mouth daily.    . ferrous sulfate 325 (65 FE) MG tablet Take 325 mg by mouth daily with breakfast.    . fluticasone (FLONASE) 50 MCG/ACT  nasal spray Place 1 spray into both nostrils daily as needed for allergies.     . folic acid (FOLVITE) 875 MCG tablet Take  400 mcg by mouth daily.      . furosemide (LASIX) 40 MG tablet Take 1 tablet (40 mg total) by mouth every Monday, Wednesday, and Friday. 30 tablet 0  . gabapentin (NEURONTIN) 100 MG capsule TAKE ONE CAPSULE BY MOUTH TWICE DAILY 30 capsule 0  . mycophenolate (CELLCEPT) 250 MG capsule Take 4 capsules (1,000 mg total) by mouth 2 (two) times daily. 60 capsule 0  . omega-3 acid ethyl esters (LOVAZA) 1 g capsule Take 1 capsule (1 g total) by mouth 2 (two) times daily. 30 capsule 0  . oxyCODONE (OXY IR/ROXICODONE) 5 MG immediate release tablet Take 5 mg by mouth 2 (two) times daily as needed for severe pain.    . pantoprazole (PROTONIX) 40 MG tablet Take 1 tablet (40 mg total) by mouth daily. (Patient taking differently: Take 40 mg by mouth daily. Do not crush)    . potassium chloride SA (K-DUR,KLOR-CON) 20 MEQ tablet Take 2 tablets (40 mEq total) by mouth daily.    . predniSONE (DELTASONE) 20 MG tablet Take 1 tablet (20 mg total) by mouth 3 (three) times daily with meals. (Patient taking differently: Take 20 mg by mouth 2 (two) times daily. ) 90 tablet 0  . senna-docusate (SENOKOT-S) 8.6-50 MG tablet Take 2 tablets by mouth at bedtime as needed for mild constipation.    . vitamin B-12 (CYANOCOBALAMIN) 1000 MCG tablet Take 1,000 mcg by mouth daily.      . vitamin C (ASCORBIC ACID) 500 MG tablet Take 1,000 mg by mouth daily.       Allergies as of 08/17/2017 - Review Complete 08/17/2017  Allergen Reaction Noted  . Codeine Other (See Comments)   . Hydrocodone  01/10/2013  . Tramadol hcl Other (See Comments) 05/15/2017    Family History  Problem Relation Age of Onset  . Dementia Mother   . Hypertension Father   . Malignant hyperthermia Father   . Colon cancer Neg Hx     Social History   Socioeconomic History  . Marital status: Divorced    Spouse name: Not on file  . Number of children: 5  . Years of education: 11th grade  . Highest education level: Not on file  Occupational History  .  Occupation: retired   Scientific laboratory technician  . Financial resource strain: Not on file  . Food insecurity:    Worry: Not on file    Inability: Not on file  . Transportation needs:    Medical: Not on file    Non-medical: Not on file  Tobacco Use  . Smoking status: Never Smoker  . Smokeless tobacco: Former Systems developer    Types: Snuff  . Tobacco comment: 07/21/2017 "no snuff  since ~ 04/2017"  Substance and Sexual Activity  . Alcohol use: No  . Drug use: No  . Sexual activity: Not Currently  Lifestyle  . Physical activity:    Days per week: Not on file    Minutes per session: Not on file  . Stress: Not on file  Relationships  . Social connections:    Talks on phone: Not on file    Gets together: Not on file    Attends religious service: Not on file    Active member of club or organization: Not on file    Attends meetings of clubs or organizations: Not on file  Relationship status: Not on file  . Intimate partner violence:    Fear of current or ex partner: Not on file    Emotionally abused: Not on file    Physically abused: Not on file    Forced sexual activity: Not on file  Other Topics Concern  . Not on file  Social History Narrative   Lives at home with her son.   Right-handed.   1 cup caffeine per day.    Review of Systems: All systems reviewed and negative except where noted in HPI.  Physical Exam: Vital signs in last 24 hours: Temp:  [96.6 F (35.9 C)-97.7 F (36.5 C)] 96.6 F (35.9 C) (08/05 0923) Pulse Rate:  [50-98] 83 (08/05 0923) Resp:  [17-24] 19 (08/05 1000) BP: (81-169)/(57-102) 148/83 (08/05 1130) SpO2:  [89 %-100 %] 100 % (08/05 0923) Weight:  [140 lb (63.5 kg)] 140 lb (63.5 kg) (08/05 0636)   General:   Thin female lying on left side, eyes open periodically. She is moaning, no really answering any questions but responsive to pain.  Psych: not able to evaluate Eyes:  Pupils equal, sclera clear, no icterus.   Conjunctiva pink. Nose:  No deformity, discharge,  or  lesions. Neck:  Supple; no masses Lungs:  Clear throughout to auscultation.   No wheezes Heart:  Regular rate and rhythm; no murmurs, no edema Abdomen:  Soft, non-distended, she moan and recoils with upper abdomen pressed. BS active, no palp mass    Rectal:  Deferred. Incontinent of a large, malodorous dark bloody BM at present  Neurologic:  Unable to perform  Intake/Output from previous day: No intake/output data recorded. Intake/Output this shift: No intake/output data recorded.  Lab Results: Recent Labs    08/15/17 08/17/17 0638  WBC 9.1  9.1 9.6  HGB 11.7*  11.7* 10.5*  HCT 36  36 33.9*  PLT 292  292 277   BMET Recent Labs    08/15/17 08/17/17 0638  NA 141 140  K 4.4 4.2  CL  --  107  CO2  --  23  GLUCOSE  --  83  BUN 31* 33*  CREATININE 0.4* 0.62  CALCIUM  --  9.0   LFT Recent Labs    08/17/17 0638  PROT 5.7*  ALBUMIN 3.2*  AST 16  ALT 18  ALKPHOS 43  BILITOT 0.8   PT/INR Recent Labs    08/17/17 0638  LABPROT 12.5  INR 0.94     Studies/Results: Ct Angio Head W Or Wo Contrast  Result Date: 08/17/2017 CLINICAL DATA:  Focal neuro deficit. Altered mental status. Periods of unresponsiveness EXAM: CT ANGIOGRAPHY HEAD AND NECK TECHNIQUE: Multidetector CT imaging of the head and neck was performed using the standard protocol during bolus administration of intravenous contrast. Multiplanar CT image reconstructions and MIPs were obtained to evaluate the vascular anatomy. Carotid stenosis measurements (when applicable) are obtained utilizing NASCET criteria, using the distal internal carotid diameter as the denominator. CONTRAST:  143mL ISOVUE-370 IOPAMIDOL (ISOVUE-370) INJECTION 76% COMPARISON:  Noncontrast head CT earlier today. CTA head neck 06/09/2017 FINDINGS: CTA NECK FINDINGS Aortic arch: Normal.  Three vessel branching. Right carotid system: Limited by motion at the distal ICA. Visible vessels are smooth and widely patent. Left carotid system: Limited by  motion at the distal left ICA. Visible vessels are smooth and widely patent. Vertebral arteries: No proximal subclavian flow limiting stenosis. Both vertebral arteries are patent in the neck. Skeleton: No acute finding Other neck: No acute finding. Upper chest:  Porta catheter on the right.  No acute finding. Review of the MIP images confirms the above findings CTA HEAD FINDINGS Anterior circulation: No evidence of emergent large vessel occlusion or proximal flow limiting stenosis atherosclerotic narrowing and irregularity of medium size vessels (M2 level and beyond) that is extensive, see markings on MIP images. Negative for aneurysm. Posterior circulation: The vertebral and basilar arteries are smooth and widely patent. Symmetric flow in bilateral branches. Atherosclerotic irregularity of left more than right PCA branches. Negative for aneurysm Venous sinuses: Patent Anatomic variants: None significant Delayed phase: Not obtained Review of the MIP images confirms the above findings IMPRESSION: 1. No emergent large vessel occlusion or other acute finding. Stable from 06/09/2017. 2. Extensive atherosclerotic irregularity and narrowing of intracranial medium size vessels. 3. No significant atherosclerosis or stenosis in the neck. Electronically Signed   By: Monte Fantasia M.D.   On: 08/17/2017 11:31   Ct Head Wo Contrast  Result Date: 08/17/2017 CLINICAL DATA:  Bilateral arm weakness EXAM: CT HEAD WITHOUT CONTRAST TECHNIQUE: Contiguous axial images were obtained from the base of the skull through the vertex without intravenous contrast. COMPARISON:  06/09/2017 brain MRI FINDINGS: Brain: Low-density in the posterior left frontal subcortical white matter is in close approximation to infarcts seen on 06/09/2017 brain MRI. There is a background of advanced chronic small vessel ischemia with confluent low-density in the cerebral white matter. Indistinct deep gray nuclei from chronic ischemic injury, including discrete  remote lacunar infarct in the right thalamus. No evident hemorrhage, mass, or hydrocephalus. Subcentimeter left parietal inner table calcification considered incidental. Vascular: No hyperdense vessel.  Atherosclerotic calcification. Skull: No evidence of fracture or bone lesion. Sinuses/Orbits: No pathologic finding. Bilateral cataract resection. Other: Motion degraded scan, best obtainable due to patient condition. IMPRESSION: 1. Motion degraded study without definite acute finding. 2. A small left cerebral white matter infarct was likely present on 06/09/2017 brain MRI. 3. Severe chronic small vessel ischemia. Electronically Signed   By: Monte Fantasia M.D.   On: 08/17/2017 09:19   Ct Angio Neck W Or Wo Contrast  Result Date: 08/17/2017 CLINICAL DATA:  Focal neuro deficit. Altered mental status. Periods of unresponsiveness EXAM: CT ANGIOGRAPHY HEAD AND NECK TECHNIQUE: Multidetector CT imaging of the head and neck was performed using the standard protocol during bolus administration of intravenous contrast. Multiplanar CT image reconstructions and MIPs were obtained to evaluate the vascular anatomy. Carotid stenosis measurements (when applicable) are obtained utilizing NASCET criteria, using the distal internal carotid diameter as the denominator. CONTRAST:  139mL ISOVUE-370 IOPAMIDOL (ISOVUE-370) INJECTION 76% COMPARISON:  Noncontrast head CT earlier today. CTA head neck 06/09/2017 FINDINGS: CTA NECK FINDINGS Aortic arch: Normal.  Three vessel branching. Right carotid system: Limited by motion at the distal ICA. Visible vessels are smooth and widely patent. Left carotid system: Limited by motion at the distal left ICA. Visible vessels are smooth and widely patent. Vertebral arteries: No proximal subclavian flow limiting stenosis. Both vertebral arteries are patent in the neck. Skeleton: No acute finding Other neck: No acute finding. Upper chest: Porta catheter on the right.  No acute finding. Review of the  MIP images confirms the above findings CTA HEAD FINDINGS Anterior circulation: No evidence of emergent large vessel occlusion or proximal flow limiting stenosis atherosclerotic narrowing and irregularity of medium size vessels (M2 level and beyond) that is extensive, see markings on MIP images. Negative for aneurysm. Posterior circulation: The vertebral and basilar arteries are smooth and widely patent. Symmetric flow in bilateral branches. Atherosclerotic  irregularity of left more than right PCA branches. Negative for aneurysm Venous sinuses: Patent Anatomic variants: None significant Delayed phase: Not obtained Review of the MIP images confirms the above findings IMPRESSION: 1. No emergent large vessel occlusion or other acute finding. Stable from 06/09/2017. 2. Extensive atherosclerotic irregularity and narrowing of intracranial medium size vessels. 3. No significant atherosclerosis or stenosis in the neck. Electronically Signed   By: Monte Fantasia M.D.   On: 08/17/2017 11:31     Tye Savoy, NP-C @  08/17/2017, 11:54 AM

## 2017-08-17 NOTE — ED Provider Notes (Addendum)
[place] EMERGENCY DEPARTMENT Provider Note   CSN: [id number] Arrival date & time: 08/17/17  3267     History   Chief Complaint Chief Complaint  Patient presents with  . Rectal Bleeding    HPI [name] [name] is a 77 y.o. female.  Level 5 caveat for acuity of condition and altered mental status.  Patient presents with frank red blood per rectum today.  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  Status post CVA in June leading to a general deterioration in her condition.  Patient is able to walk with a walker on a normal day.     Past Medical History:  Diagnosis Date  . Allergic rhinitis, cause unspecified   . Benign neoplasm of colon   . Chronic diastolic CHF (congestive heart failure) ([place])   . CVA (cerebral vascular accident) ([place]) 05/2017  . Esophageal reflux   . Excessive daytime sleepiness   . 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 GI hemorrhage 2011   recurrent  . Hyperlipidemia   . Osteoporosis   . Polymyositis ([place])   . Unspecified essential hypertension   . Vitamin D deficiency     Patient Active Problem List   Diagnosis Date Noted  . Hyperlipidemia 07/26/2017  . Pressure ulcer of buttock 07/22/2017  . Neck mass 07/21/2017  . Neuropathic pain   . 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 Neurologic deficit due to acute ischemic stroke ([place])   . Hypokalemia   . CVA (cerebral vascular accident) ([place]) 06/09/2017  . Multiple pulmonary nodules 06/09/2017  . Hypothermia 10/22/2016  . Chronic diastolic CHF (congestive heart failure) ([place]) 10/22/2016  . Abdominal pain, unspecified site 05/17/2013  . Nausea alone 05/17/2013  . 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 Abdominal pain, left lower quadrant 05/28/2011  . Cecal ulcer with hemorrhage 01/10/2011  . Acute blood loss anemia 12/06/2010  . Hematochezia 12/05/2010  . Anemia 12/05/2010  . Adrenal insufficiency ([place])  12/05/2010  . Osteoporosis 12/05/2010  . LUNG INVOLVEMENT OTHER DISEASES CLASSIFIED ELSW 08/13/2009  . OTHER SPECIFIED DISORDER OF STOMACH AND DUODENUM 07/06/2008  . Essential hypertension 04/26/2007  . ALLERGIC RHINITIS 04/26/2007  . GASTROESOPHAGEAL REFLUX DISEASE 04/26/2007  . Polymyositis ([place]) 04/26/2007  . COLONIC POLYPS, ADENOMATOUS 02/06/2005    Past Surgical History:  Procedure Laterality Date  . ABDOMINAL HYSTERECTOMY     partial  . BACK SURGERY    . COLONOSCOPY  01/09/2011   others also  . COLONOSCOPY  05/29/2011   Procedure: COLONOSCOPY;  Surgeon: Gatha Mayer, MD;  Location: [facility];  Service: Endoscopy;  Laterality: N/A;  Greggory Brandy Carlean Purl  . COLONOSCOPY WITH PROPOFOL N/A 02/16/2014   Procedure: COLONOSCOPY WITH PROPOFOL;  Surgeon: Milus Banister, MD;  Location: [facility];  Service: Endoscopy;  Laterality: N/A;  . ESOPHAGOGASTRODUODENOSCOPY  12/08/2010   others also  . ESOPHAGOGASTRODUODENOSCOPY (EGD) WITH PROPOFOL N/A 02/16/2014   Procedure: ESOPHAGOGASTRODUODENOSCOPY (EGD) WITH PROPOFOL;  Surgeon: Milus Banister, MD;  Location: [facility];  Service: Endoscopy;  Laterality: N/A;  . EUS    . HOT HEMOSTASIS  05/29/2011   Procedure: HOT HEMOSTASIS (ARGON PLASMA COAGULATION/BICAP);  Surgeon: Gatha Mayer, MD;  Location: [facility];  Service: Endoscopy;  Laterality: N/A;  . LUMBAR LAMINECTOMY    . ROTATOR CUFF REPAIR Bilateral   . TONSILLECTOMY  age 80     OB History   None      Home  Medications    Prior to Admission medications   Medication Sig Start Date End Date Taking? Authorizing Provider  acetaminophen (TYLENOL 8 HOUR) 650 MG CR tablet Take 1 tablet (650 mg total) by mouth every 8 (eight) hours as needed. Patient taking differently: Take 650 mg by mouth every 8 (eight) hours as needed for pain.  05/15/17  Yes Varney Biles, MD  allopurinol (ZYLOPRIM) 100 MG tablet Take 1 tablet (100 mg total) by mouth daily. 07/23/17 08/22/17 Yes Florencia Reasons, MD    ALPRAZolam Duanne Moron) 0.25 MG tablet Take 0.25 mg by mouth 2 (two) times daily as needed for anxiety (for 14 days.). 08/15/17 08/29/17 Yes [provider]  aspirin 81 MG chewable tablet Chew 1 tablet (81 mg total) by mouth daily. 06/12/17  Yes Caren Griffins, MD  atenolol (TENORMIN) 50 MG tablet Take 50 mg by mouth daily.     Yes [provider]  Calcium Carbonate (CALCIUM 600 PO) Take 1,200 mg by mouth daily.   Yes [provider]  clopidogrel (PLAVIX) 75 MG tablet Take 75 mg by mouth daily.   Yes [provider]  ferrous sulfate 325 (65 FE) MG tablet Take 325 mg by mouth daily with breakfast.   Yes [provider]  fluticasone (FLONASE) 50 MCG/ACT nasal spray Place 1 spray into both nostrils daily as needed for allergies.  06/05/17  Yes [provider]  folic acid (FOLVITE) 160 MCG tablet Take 400 mcg by mouth daily.     Yes [provider]  furosemide (LASIX) 40 MG tablet Take 1 tablet (40 mg total) by mouth every Monday, Wednesday, and Friday. 07/24/17  Yes Florencia Reasons, MD  gabapentin (NEURONTIN) 100 MG capsule TAKE ONE CAPSULE BY MOUTH TWICE DAILY 04/14/17  Yes Regal, Tamala Fothergill, DPM  mycophenolate (CELLCEPT) 250 MG capsule Take 4 capsules (1,000 mg total) by mouth 2 (two) times daily. 07/23/17  Yes Florencia Reasons, MD  omega-3 acid ethyl esters (LOVAZA) 1 g capsule Take 1 capsule (1 g total) by mouth 2 (two) times daily. 07/23/17  Yes Florencia Reasons, MD  oxyCODONE (OXY IR/ROXICODONE) 5 MG immediate release tablet Take 5 mg by mouth 2 (two) times daily as needed for severe pain.   Yes [provider]  pantoprazole (PROTONIX) 40 MG tablet Take 1 tablet (40 mg total) by mouth daily. Patient taking differently: Take 40 mg by mouth daily. Do not crush 06/25/17  Yes Love, Ivan Anchors, PA-C  potassium chloride SA (K-DUR,KLOR-CON) 20 MEQ tablet Take 2 tablets (40 mEq total) by mouth daily. 06/25/17  Yes Love, Ivan Anchors, PA-C  predniSONE (DELTASONE) 20 MG tablet  Take 1 tablet (20 mg total) by mouth 3 (three) times daily with meals. Patient taking differently: Take 20 mg by mouth 2 (two) times daily.  07/23/17 08/22/17 Yes Florencia Reasons, MD  senna-docusate (SENOKOT-S) 8.6-50 MG tablet Take 2 tablets by mouth at bedtime as needed for mild constipation. 06/24/17  Yes Love, Ivan Anchors, PA-C  vitamin B-12 (CYANOCOBALAMIN) 1000 MCG tablet Take 1,000 mcg by mouth daily.     Yes [provider]  vitamin C (ASCORBIC ACID) 500 MG tablet Take 1,000 mg by mouth daily.    Yes [provider]    Family History Family History  Problem Relation Age of Onset  . Dementia Mother   . Hypertension Father   . Malignant hyperthermia Father   . Colon cancer Neg Hx     Social History Social History   Tobacco Use  .  Smoking status: Never Smoker  . Smokeless tobacco: Former Systems developer    Types: Snuff  . Tobacco comment: 07/21/2017 "no snuff  since ~ 04/2017"  Substance Use Topics  . Alcohol use: No  . Drug use: No     Allergies   Codeine; Hydrocodone; and Tramadol hcl   Review of Systems Review of Systems  Unable to perform ROS: Acuity of condition     Physical Exam Updated Vital Signs BP (!) 161/101   Pulse 83   Temp (!) 96.6 F (35.9 C) (Axillary)   Resp 19   Ht 5\' 2"  (1.575 m)   Wt 63.5 kg (140 lb)   SpO2 100%   BMI 25.61 kg/m   Physical Exam  Constitutional: She is oriented to person, place, and time.  Not well-appearing, small amount of vomiting.  HENT:  Head: Normocephalic and atraumatic.  Eyes: Conjunctivae are normal.  Neck: Neck supple.  Cardiovascular: Normal rate and regular rhythm.  Pulmonary/Chest: Effort normal and breath sounds normal.  Abdominal: Soft. Bowel sounds are normal.  Genitourinary:  Genitourinary Comments: Gross red blood per rectum  Musculoskeletal: Normal range of motion.  Neurological: She is alert and oriented to person, place, and time.  Skin: Skin is warm and dry.  Psychiatric: She has a normal mood  and affect. Her behavior is normal.  Nursing note and vitals reviewed.    ED Treatments / Results  Labs (all labs ordered are listed, but only abnormal results are displayed) Labs Reviewed  COMPREHENSIVE METABOLIC PANEL - Abnormal; Notable for the following components:      Result Value   BUN 33 (*)    Total Protein 5.7 (*)    Albumin 3.2 (*)    All other components within normal limits  CBC - Abnormal; Notable for the following components:   Hemoglobin 10.5 (*)    HCT 33.9 (*)    RDW 18.2 (*)    All other components within normal limits  PROTIME-INR  POC OCCULT BLOOD, ED  TYPE AND SCREEN  PREPARE RBC (CROSSMATCH)  ABO/RH    EKG None  Radiology Ct Head Wo Contrast  Result Date: 08/17/2017 CLINICAL DATA:  Bilateral arm weakness EXAM: CT HEAD WITHOUT CONTRAST TECHNIQUE: Contiguous axial images were obtained from the base of the skull through the vertex without intravenous contrast. COMPARISON:  06/09/2017 brain MRI FINDINGS: Brain: Low-density in the posterior left frontal subcortical white matter is in close approximation to infarcts seen on 06/09/2017 brain MRI. There is a background of advanced chronic small vessel ischemia with confluent low-density in the cerebral white matter. Indistinct deep gray nuclei from chronic ischemic injury, including discrete remote lacunar infarct in the right thalamus. No evident hemorrhage, mass, or hydrocephalus. Subcentimeter left parietal inner table calcification considered incidental. Vascular: No hyperdense vessel.  Atherosclerotic calcification. Skull: No evidence of fracture or bone lesion. Sinuses/Orbits: No pathologic finding. Bilateral cataract resection. Other: Motion degraded scan, best obtainable due to patient condition. IMPRESSION: 1. Motion degraded study without definite acute finding. 2. A small left cerebral white matter infarct was likely present on 06/09/2017 brain MRI. 3. Severe chronic small vessel ischemia. Electronically  Signed   By: Monte Fantasia M.D.   On: 08/17/2017 09:19    Procedures Procedures (including critical care time)  Medications Ordered in ED Medications  0.9 %  sodium chloride infusion (Manually program via Guardrails IV Fluids) (has no administration in time range)  0.9 %  sodium chloride infusion ( Intravenous New Bag/Given 08/17/17 0825)  Initial Impression / Assessment and Plan / ED Course  I have reviewed the triage vital signs and the nursing notes.  Pertinent labs & imaging results that were available during my care of the patient were reviewed by me and considered in my medical decision making (see chart for details).     Patient presents with gross bleeding per rectum.  During her emergency department course this morning she has had episodes of altered mental status.  Her blood pressure has been documented to be low intermittently.  Pressure is normal now.  Globin stable.  CT scan head shows no acute findings.  I discussed the clinical findings with the gastroenterologist and neurologist on call.  Will admit to general medicine.  Daughter confirms DO NOT RESUSCITATE.   CRITICAL CARE Performed by: Nat Christen Total critical care time: 40 minutes Critical care time was exclusive of separately billable procedures and treating other patients. Critical care was necessary to treat or prevent imminent or life-threatening deterioration. Critical care was time spent personally by me on the following activities: development of treatment plan with patient and/or surrogate as well as nursing, discussions with consultants, evaluation of patient's response to treatment, examination of patient, obtaining history from patient or surrogate, ordering and performing treatments and interventions, ordering and review of laboratory studies, ordering and review of radiographic studies, pulse oximetry and re-evaluation of patient's condition.  Final Clinical Impressions(s) / ED Diagnoses   Final  diagnoses:  Rectal bleeding  Altered mental status, unspecified altered mental status type    ED Discharge Orders    None       Nat Christen, MD 08/17/17 1055    Nat Christen, MD 08/17/17 1056

## 2017-08-17 NOTE — ED Notes (Signed)
ICU dr here to see

## 2017-08-17 NOTE — Procedures (Signed)
Intubation Procedure Note [name] [name] [id number] 1940/07/01  Procedure: Intubation Indications: Airway protection and maintenance  Procedure Details Consent: Risks of procedure as well as the alternatives and risks of each were explained to the (patient/caregiver).  Consent for procedure obtained. Time Out: Verified patient identification, verified procedure, site/side was marked, verified correct patient position, special equipment/implants available, medications/allergies/relevent history reviewed, required imaging and test results available.  Performed  Maximum sterile technique was used including n/a.   Anterior larynx but grade 1 laryngoscopic view obtained.  Initial attempt with MAC 3 and 7.5 mm tube appeared to pass through the cords but patient difficult to bag and no color change on colorimetry.  Tube withdrawn and second attempt successful using glide scope with 7.0 mm tube.  Tube secured at 22 cm at lower incisors.  Evaluation Hemodynamic Status: BP stable throughout; O2 sats: transiently fell during during procedure Patient's Current Condition: stable Complications: No apparent complications Patient did tolerate procedure well. Chest X-ray ordered to verify placement.  CXR: pending.   [name] [name] 08/17/2017

## 2017-08-17 NOTE — H&P (Signed)
History and Physical    DIETRICH KE [name]:[id number] DOB: 1940-05-27 DOA: 08/17/2017  PCP: [name], MD Consultants:  Amil Amen - rheumatology; Ardis Hughs - GI; ortho Patient coming from:  [facility]; [facility]: Daughter, [phone]   Chief Complaint: rectal bleeding  HPI: [name] [name] is a 77 y.o. female with medical history significant of HTN; polymyositis; HLD; gastric leiomyoma; CAD; and chronic diastolic heart failure presenting with rectal bleeding.  Her facility called this AM and reported significant bleeding from her rectum.  She has a remote h/o this - but it was dark red blood at that time.  She has been diagnosed with an ulcer as well as polyps.  She was started on medication and has not had any further issues.  She has had several episodes of bright red bleeding today.  She also had some starting episodes and heavy breathing.  The nurse came in and she wasn't responding.  They ordered a CT and took her.  She was responsive when she came back and then she became unresponsive again.  Her BP started coming back up and the fmaiy left again.  She had another epoisde and went for another CT.  She is opening her eyes now and is not talking.  Her last colonoscopy was in 2/16 with a few diverticula and h/o adenomatous polyps.   ED Course:  Rectal bleeding at [facility].  Gross blood on rectal.  Since then, has had a couple episodes of poorly responsive - +/- related to hypotension.  CT negative.  Dr. Leonel Ramsay to see.  Hgb is stable.  Had blood ordered initially due to aggressive history.  She is DNR.  GI (?Dr. Therisa Doyne) has been consulted and will see patient.  Review of Systems:  Unable to perform   Ambulatory Status:  Non-ambulatory  Past Medical History:  Diagnosis Date  . Allergic rhinitis, cause unspecified   . Benign neoplasm of colon   . Chronic diastolic CHF (congestive heart failure) ([place])   . CVA (cerebral vascular accident) ([place]) 05/2017  . Esophageal reflux   . Excessive  daytime sleepiness   . 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 GI hemorrhage 2011   recurrent  . Hyperlipidemia   . Osteoporosis   . Polymyositis ([place])   . Unspecified essential hypertension   . Vitamin D deficiency     Past Surgical History:  Procedure Laterality Date  . ABDOMINAL HYSTERECTOMY     partial  . BACK SURGERY    . COLONOSCOPY  01/09/2011   others also  . COLONOSCOPY  05/29/2011   Procedure: COLONOSCOPY;  Surgeon: Gatha Mayer, MD;  Location: [facility]COPY;  Service: Endoscopy;  Laterality: N/A;  Greggory Brandy Carlean Purl  . COLONOSCOPY WITH PROPOFOL N/A 02/16/2014   Procedure: COLONOSCOPY WITH PROPOFOL;  Surgeon: Milus Banister, MD;  Location: [facility];  Service: Endoscopy;  Laterality: N/A;  . ESOPHAGOGASTRODUODENOSCOPY  12/08/2010   others also  . ESOPHAGOGASTRODUODENOSCOPY (EGD) WITH PROPOFOL N/A 02/16/2014   Procedure: ESOPHAGOGASTRODUODENOSCOPY (EGD) WITH PROPOFOL;  Surgeon: Milus Banister, MD;  Location: [facility];  Service: Endoscopy;  Laterality: N/A;  . EUS    . HOT HEMOSTASIS  05/29/2011   Procedure: HOT HEMOSTASIS (ARGON PLASMA COAGULATION/BICAP);  Surgeon: Gatha Mayer, MD;  Location: [facility];  Service: Endoscopy;  Laterality: N/A;  . LUMBAR LAMINECTOMY    . ROTATOR CUFF REPAIR Bilateral   . TONSILLECTOMY  age 43    Social History   Socioeconomic History  . Marital  status: Divorced    Spouse name: Not on file  . Number of children: 5  . Years of education: 11th grade  . Highest education level: Not on file  Occupational History  . Occupation: retired   Scientific laboratory technician  . Financial resource strain: Not on file  . Food insecurity:    Worry: Not on file    Inability: Not on file  . Transportation needs:    Medical: Not on file    Non-medical: Not on file  Tobacco Use  . Smoking status: Never Smoker  . Smokeless tobacco: Former Systems developer    Types: Snuff  . Tobacco comment: 07/21/2017 "no snuff  since ~ 04/2017"  Substance and Sexual Activity  .  Alcohol use: No  . Drug use: No  . Sexual activity: Not Currently  Lifestyle  . Physical activity:    Days per week: Not on file    Minutes per session: Not on file  . Stress: Not on file  Relationships  . Social connections:    Talks on phone: Not on file    Gets together: Not on file    Attends religious service: Not on file    Active member of club or organization: Not on file    Attends meetings of clubs or organizations: Not on file    Relationship status: Not on file  . Intimate partner violence:    Fear of current or ex partner: Not on file    Emotionally abused: Not on file    Physically abused: Not on file    Forced sexual activity: Not on file  Other Topics Concern  . Not on file  Social History Narrative   Lives at home with her son.   Right-handed.   1 cup caffeine per day.    Allergies  Allergen Reactions  . Codeine Other (See Comments)    Feels drunk  . Hydrocodone     "makes me sleepy"  . Tramadol Hcl Other (See Comments)    Makes me feel drunk    Family History  Problem Relation Age of Onset  . Dementia Mother   . Hypertension Father   . Malignant hyperthermia Father   . Colon cancer Neg Hx     Prior to Admission medications   Medication Sig Start Date End Date Taking? Authorizing Provider  acetaminophen (TYLENOL 8 HOUR) 650 MG CR tablet Take 1 tablet (650 mg total) by mouth every 8 (eight) hours as needed. Patient taking differently: Take 650 mg by mouth every 8 (eight) hours as needed for pain.  05/15/17  Yes Varney Biles, MD  allopurinol (ZYLOPRIM) 100 MG tablet Take 1 tablet (100 mg total) by mouth daily. 07/23/17 08/22/17 Yes Florencia Reasons, MD  ALPRAZolam Duanne Moron) 0.25 MG tablet Take 0.25 mg by mouth 2 (two) times daily as needed for anxiety (for 14 days.). 08/15/17 08/29/17 Yes [provider]  aspirin 81 MG chewable tablet Chew 1 tablet (81 mg total) by mouth daily. 06/12/17  Yes Caren Griffins, MD  atenolol (TENORMIN) 50 MG tablet Take 50  mg by mouth daily.     Yes [provider]  Calcium Carbonate (CALCIUM 600 PO) Take 1,200 mg by mouth daily.   Yes [provider]  clopidogrel (PLAVIX) 75 MG tablet Take 75 mg by mouth daily.   Yes [provider]  ferrous sulfate 325 (65 FE) MG tablet Take 325 mg by mouth daily with breakfast.   Yes [provider]  fluticasone (FLONASE) 50  MCG/ACT nasal spray Place 1 spray into both nostrils daily as needed for allergies.  06/05/17  Yes [provider]  folic acid (FOLVITE) 956 MCG tablet Take 400 mcg by mouth daily.     Yes [provider]  furosemide (LASIX) 40 MG tablet Take 1 tablet (40 mg total) by mouth every Monday, Wednesday, and Friday. 07/24/17  Yes Florencia Reasons, MD  gabapentin (NEURONTIN) 100 MG capsule TAKE ONE CAPSULE BY MOUTH TWICE DAILY 04/14/17  Yes Regal, Tamala Fothergill, DPM  mycophenolate (CELLCEPT) 250 MG capsule Take 4 capsules (1,000 mg total) by mouth 2 (two) times daily. 07/23/17  Yes Florencia Reasons, MD  omega-3 acid ethyl esters (LOVAZA) 1 g capsule Take 1 capsule (1 g total) by mouth 2 (two) times daily. 07/23/17  Yes Florencia Reasons, MD  oxyCODONE (OXY IR/ROXICODONE) 5 MG immediate release tablet Take 5 mg by mouth 2 (two) times daily as needed for severe pain.   Yes [provider]  pantoprazole (PROTONIX) 40 MG tablet Take 1 tablet (40 mg total) by mouth daily. Patient taking differently: Take 40 mg by mouth daily. Do not crush 06/25/17  Yes Love, Ivan Anchors, PA-C  potassium chloride SA (K-DUR,KLOR-CON) 20 MEQ tablet Take 2 tablets (40 mEq total) by mouth daily. 06/25/17  Yes Love, Ivan Anchors, PA-C  predniSONE (DELTASONE) 20 MG tablet Take 1 tablet (20 mg total) by mouth 3 (three) times daily with meals. Patient taking differently: Take 20 mg by mouth 2 (two) times daily.  07/23/17 08/22/17 Yes Florencia Reasons, MD  senna-docusate (SENOKOT-S) 8.6-50 MG tablet Take 2 tablets by mouth at bedtime as needed for mild constipation. 06/24/17  Yes Love, Ivan Anchors, PA-C  vitamin B-12 (CYANOCOBALAMIN) 1000 MCG tablet Take 1,000 mcg by mouth daily.     Yes [provider]  vitamin C (ASCORBIC ACID) 500 MG tablet Take 1,000 mg by mouth daily.    Yes [provider]    Physical Exam: Vitals:   08/17/17 0951 08/17/17 1000 08/17/17 1127 08/17/17 1130  BP: (!) 144/86 (!) 161/101 (!) 169/102 (!) 148/83  Pulse:      Resp:  19    Temp:      TempSrc:      SpO2:      Weight:      Height:         General: Responsive to pain, moaning, does not make eye contact but opens eyes to pain Eyes: normal lids, iris ENT: normal lips & tongue, mmm Neck:  no LAD, masses or thyromegaly Cardiovascular:  RRR, no m/r/g. No LE edema.  Respiratory:   CTA bilaterally with no wheezes/rales/rhonchi.  Normal respiratory effort. Abdomen:  soft, NT, ND, NABS Skin:  no rash or induration seen on limited exam Musculoskeletal:  grossly normal tone BUE/BLE, good ROM, no bony abnormality Lower extremity:  No LE edema.  Limited foot exam with no ulcerations.  2+ distal pulses. Psychiatric: obtunded, responsive only to pain Neurologic: unable to perform    Radiological Exams on Admission: Ct Angio Head W Or Wo Contrast  Result Date: 08/17/2017 CLINICAL DATA:  Focal neuro deficit. Altered mental status. Periods of unresponsiveness EXAM: CT ANGIOGRAPHY HEAD AND NECK TECHNIQUE: Multidetector CT imaging of the head and neck was performed using the standard protocol during bolus administration of intravenous contrast. Multiplanar CT image reconstructions and MIPs were obtained to evaluate the vascular anatomy. Carotid stenosis measurements (when applicable) are obtained utilizing NASCET criteria, using the distal internal carotid diameter as the denominator.  CONTRAST:  145mL ISOVUE-370 IOPAMIDOL (ISOVUE-370) INJECTION 76% COMPARISON:  Noncontrast head CT earlier today. CTA head neck 06/09/2017 FINDINGS: CTA NECK FINDINGS Aortic arch: Normal.  Three vessel branching.  Right carotid system: Limited by motion at the distal ICA. Visible vessels are smooth and widely patent. Left carotid system: Limited by motion at the distal left ICA. Visible vessels are smooth and widely patent. Vertebral arteries: No proximal subclavian flow limiting stenosis. Both vertebral arteries are patent in the neck. Skeleton: No acute finding Other neck: No acute finding. Upper chest: Porta catheter on the right.  No acute finding. Review of the MIP images confirms the above findings CTA HEAD FINDINGS Anterior circulation: No evidence of emergent large vessel occlusion or proximal flow limiting stenosis atherosclerotic narrowing and irregularity of medium size vessels (M2 level and beyond) that is extensive, see markings on MIP images. Negative for aneurysm. Posterior circulation: The vertebral and basilar arteries are smooth and widely patent. Symmetric flow in bilateral branches. Atherosclerotic irregularity of left more than right PCA branches. Negative for aneurysm Venous sinuses: Patent Anatomic variants: None significant Delayed phase: Not obtained Review of the MIP images confirms the above findings IMPRESSION: 1. No emergent large vessel occlusion or other acute finding. Stable from 06/09/2017. 2. Extensive atherosclerotic irregularity and narrowing of intracranial medium size vessels. 3. No significant atherosclerosis or stenosis in the neck. Electronically Signed   By: Monte Fantasia M.D.   On: 08/17/2017 11:31   Ct Head Wo Contrast  Result Date: 08/17/2017 CLINICAL DATA:  Bilateral arm weakness EXAM: CT HEAD WITHOUT CONTRAST TECHNIQUE: Contiguous axial images were obtained from the base of the skull through the vertex without intravenous contrast. COMPARISON:  06/09/2017 brain MRI FINDINGS: Brain: Low-density in the posterior left frontal subcortical white matter is in close approximation to infarcts seen on 06/09/2017 brain MRI. There is a background of advanced chronic small vessel  ischemia with confluent low-density in the cerebral white matter. Indistinct deep gray nuclei from chronic ischemic injury, including discrete remote lacunar infarct in the right thalamus. No evident hemorrhage, mass, or hydrocephalus. Subcentimeter left parietal inner table calcification considered incidental. Vascular: No hyperdense vessel.  Atherosclerotic calcification. Skull: No evidence of fracture or bone lesion. Sinuses/Orbits: No pathologic finding. Bilateral cataract resection. Other: Motion degraded scan, best obtainable due to patient condition. IMPRESSION: 1. Motion degraded study without definite acute finding. 2. A small left cerebral white matter infarct was likely present on 06/09/2017 brain MRI. 3. Severe chronic small vessel ischemia. Electronically Signed   By: Monte Fantasia M.D.   On: 08/17/2017 09:19   Ct Angio Neck W Or Wo Contrast  Result Date: 08/17/2017 CLINICAL DATA:  Focal neuro deficit. Altered mental status. Periods of unresponsiveness EXAM: CT ANGIOGRAPHY HEAD AND NECK TECHNIQUE: Multidetector CT imaging of the head and neck was performed using the standard protocol during bolus administration of intravenous contrast. Multiplanar CT image reconstructions and MIPs were obtained to evaluate the vascular anatomy. Carotid stenosis measurements (when applicable) are obtained utilizing NASCET criteria, using the distal internal carotid diameter as the denominator. CONTRAST:  11mL ISOVUE-370 IOPAMIDOL (ISOVUE-370) INJECTION 76% COMPARISON:  Noncontrast head CT earlier today. CTA head neck 06/09/2017 FINDINGS: CTA NECK FINDINGS Aortic arch: Normal.  Three vessel branching. Right carotid system: Limited by motion at the distal ICA. Visible vessels are smooth and widely patent. Left carotid system: Limited by motion at the distal left ICA. Visible vessels are smooth and widely patent. Vertebral arteries: No proximal subclavian flow limiting stenosis. Both vertebral arteries  are patent in  the neck. Skeleton: No acute finding Other neck: No acute finding. Upper chest: Porta catheter on the right.  No acute finding. Review of the MIP images confirms the above findings CTA HEAD FINDINGS Anterior circulation: No evidence of emergent large vessel occlusion or proximal flow limiting stenosis atherosclerotic narrowing and irregularity of medium size vessels (M2 level and beyond) that is extensive, see markings on MIP images. Negative for aneurysm. Posterior circulation: The vertebral and basilar arteries are smooth and widely patent. Symmetric flow in bilateral branches. Atherosclerotic irregularity of left more than right PCA branches. Negative for aneurysm Venous sinuses: Patent Anatomic variants: None significant Delayed phase: Not obtained Review of the MIP images confirms the above findings IMPRESSION: 1. No emergent large vessel occlusion or other acute finding. Stable from 06/09/2017. 2. Extensive atherosclerotic irregularity and narrowing of intracranial medium size vessels. 3. No significant atherosclerosis or stenosis in the neck. Electronically Signed   By: Monte Fantasia M.D.   On: 08/17/2017 11:31    EKG: pending   Labs on Admission: I have personally reviewed the available labs and imaging studies at the time of the admission.  Pertinent labs:   BUN 33/Creatinine 0.62/GFR >60 WBC 9.6 Hgb 10.5; 10.2 on 7/11 INR 0.94  Assessment/Plan Principal Problem:   Hematochezia Active Problems:   Essential hypertension   Polymyositis (HCC)   Adrenal insufficiency (HCC)   Acute blood loss anemia   Chronic diastolic CHF (congestive heart failure) (HCC)   CVA (cerebral vascular accident) ([place])   Hyperlipidemia   Syncope, vasovagal    GI bleed, lower, with hematochezia -Prior h/o cecal ulcer with hemorrhage; adenomatous polyps; AVM with hemorrhage -Differential to include brisk upper bleeding, bleeding hemorrhoids, diverticular bleeding,and AVM; given persistent bleeding,  possibly large-volume, with recurrent syncopal epidoses, AVM is most concerning. -Will need admission to either SDU or ICU - PCCM consult pending -CBC q6h; transfuse for Hgb <7 and given 2 units PRBC in the ER now -Continue to monitor for recurrent bleeding  -GI has been consulted and is aware of the syncopal episodes  Syncope, likely vasovagal related to development of hemorrhagic shock -Patient with at least 2 episodes in the ER of unresponsiveness -With first episode, faint pulse and BP in the low 60s -With second episode, patient BP 49/31, placed in trendelenburg -Code stroke called and neurology consult requested by ER -CT and CTA negative -These episodes appear to be very likely related to syncope -Given the brisk nature of the bleeding in conjunction with the recurrent syncopal episodes, I have consulted PCCM to see the patient and consider admission to ICU -If not ICU, will admit to SDU in conjunction with PCCM consultation -She has not returned to baseline as of now  Polymyositis with chronic adrenal insufficiency -Patient takes Cellcept and prednisone -Currently NPO so will give stress-dosed steroids, hydrocortisone 50 mg IV q6h  H/o CVA -Hold ASA and Plavix - despite recent CVA, she is actively bleeding and is likely to need intervention  HTN -Hold Atenolol -Will use prn Lopressor for HTN/tachycardia  Chronic diastolic CHF -Appears to be compensated at this time -Hold Lasix  HLD -Hold Lovaza for now -Not on statin due to polymyositis, elevated CK   DVT prophylaxis: SCDs Code Status:  DNR - confirmed with family Family Communication: Daughter, son (and son-in-law and grandchildren) all present throughout evaluation.  There is some conflict regarding goals of care with daughter and son. Disposition Plan:  Likely to need SNF once clinically improved Consults called: PCCM;  GI Admission status: Admit - It is my clinical opinion that admission to INPATIENT is reasonable  and necessary because of the expectation that this patient will require hospital care that crosses at least 2 midnights to treat this condition based on the medical complexity of the problems presented.  Given the aforementioned information, the predictability of an adverse outcome is felt to be significant.   Total critical care time: 60 minutes Critical care time was exclusive of separately billable procedures and treating other patients. Critical care was necessary to treat or prevent imminent or life-threatening deterioration. Critical care was time spent personally by me on the following activities: development of treatment plan with patient and/or surrogate as well as nursing, discussions with consultants, evaluation of patient's response to treatment, examination of patient, obtaining history from patient or surrogate, ordering and performing treatments and interventions, ordering and review of laboratory studies, ordering and review of radiographic studies, pulse oximetry and re-evaluation of patient's condition.   Karmen Bongo MD [facility]  If note is complete, please contact covering daytime or nighttime physician. [email or web address] Password [email or web address]  08/17/2017, 11:41 AM

## 2017-08-17 NOTE — ED Notes (Signed)
Patient alert but not responding to verbal stimuli/following commands, only responding to pain. Dr. Lacinda Axon made aware

## 2017-08-17 NOTE — ED Notes (Signed)
Patient arrived to CT with this RN and PA Tamala Julian

## 2017-08-17 NOTE — Progress Notes (Signed)
Transported pt to CT and back no noted respiratory issues

## 2017-08-18 ENCOUNTER — Inpatient Hospital Stay (HOSPITAL_COMMUNITY): Payer: Medicare Other

## 2017-08-18 DIAGNOSIS — K625 Hemorrhage of anus and rectum: Secondary | ICD-10-CM

## 2017-08-18 DIAGNOSIS — Z7982 Long term (current) use of aspirin: Secondary | ICD-10-CM

## 2017-08-18 DIAGNOSIS — K921 Melena: Secondary | ICD-10-CM

## 2017-08-18 DIAGNOSIS — Z7901 Long term (current) use of anticoagulants: Secondary | ICD-10-CM

## 2017-08-18 LAB — TYPE AND SCREEN
ABO/RH(D): O POS
Antibody Screen: NEGATIVE
UNIT DIVISION: 0
UNIT DIVISION: 0
Unit division: 0

## 2017-08-18 LAB — BASIC METABOLIC PANEL
ANION GAP: 11 (ref 5–15)
BUN: 20 mg/dL (ref 8–23)
CO2: 20 mmol/L — AB (ref 22–32)
Calcium: 7.9 mg/dL — ABNORMAL LOW (ref 8.9–10.3)
Chloride: 113 mmol/L — ABNORMAL HIGH (ref 98–111)
Creatinine, Ser: 0.63 mg/dL (ref 0.44–1.00)
GFR calc non Af Amer: >60 mL/min (ref >60)
Glucose, Bld: 128 mg/dL — ABNORMAL HIGH (ref 70–99)
Potassium: 3.6 mmol/L (ref 3.5–5.1)
Sodium: 144 mmol/L (ref 135–145)

## 2017-08-18 LAB — CBC
HCT: 30.5 % — ABNORMAL LOW (ref 36.0–46.0)
HCT: 31.2 % — ABNORMAL LOW (ref 36.0–46.0)
HEMATOCRIT: 28.7 % — AB (ref 36.0–46.0)
HEMATOCRIT: 27.9 % — AB (ref 36.0–46.0)
HEMOGLOBIN: 10.3 g/dL — AB (ref 12.0–15.0)
HEMOGLOBIN: 9.6 g/dL — AB (ref 12.0–15.0)
Hemoglobin: 10.5 g/dL — ABNORMAL LOW (ref 12.0–15.0)
Hemoglobin: 9.1 g/dL — ABNORMAL LOW (ref 12.0–15.0)
MCH: 27 pg (ref 26.0–34.0)
MCH: 26.8 pg (ref 26.0–34.0)
MCH: 26.6 pg (ref 26.0–34.0)
MCH: 26.5 pg (ref 26.0–34.0)
MCHC: 33.8 g/dL (ref 30.0–36.0)
MCHC: 33.7 g/dL (ref 30.0–36.0)
MCHC: 33.4 g/dL (ref 30.0–36.0)
MCHC: 32.6 g/dL (ref 30.0–36.0)
MCV: 80.1 fL (ref 78.0–100.0)
MCV: 79.6 fL (ref 78.0–100.0)
MCV: 79.5 fL (ref 78.0–100.0)
MCV: 81.1 fL (ref 78.0–100.0)
Platelets: 254 10*3/uL (ref 150–400)
Platelets: 257 10*3/uL (ref 150–400)
Platelets: 235 10*3/uL (ref 150–400)
Platelets: 222 10*3/uL (ref 150–400)
RBC: 3.81 MIL/uL — ABNORMAL LOW (ref 3.87–5.11)
RBC: 3.92 MIL/uL (ref 3.87–5.11)
RBC: 3.61 MIL/uL — ABNORMAL LOW (ref 3.87–5.11)
RBC: 3.44 MIL/uL — AB (ref 3.87–5.11)
RDW: 17.3 % — ABNORMAL HIGH (ref 11.5–15.5)
RDW: 17.6 % — ABNORMAL HIGH (ref 11.5–15.5)
RDW: 17.6 % — ABNORMAL HIGH (ref 11.5–15.5)
RDW: 18 % — ABNORMAL HIGH (ref 11.5–15.5)
WBC: 23.3 10*3/uL — AB (ref 4.0–10.5)
WBC: 22.7 10*3/uL — ABNORMAL HIGH (ref 4.0–10.5)
WBC: 19.6 10*3/uL — ABNORMAL HIGH (ref 4.0–10.5)
WBC: 16.6 10*3/uL — ABNORMAL HIGH (ref 4.0–10.5)

## 2017-08-18 LAB — BPAM RBC
BLOOD PRODUCT EXPIRATION DATE: 201908102359
BLOOD PRODUCT EXPIRATION DATE: 201909062359
Blood Product Expiration Date: 201908102359
ISSUE DATE / TIME: 201908051453
ISSUE DATE / TIME: 201908050838
ISSUE DATE / TIME: 201908051704
UNIT TYPE AND RH: 5100
UNIT TYPE AND RH: 5100
Unit Type and Rh: 5100

## 2017-08-18 LAB — PREPARE PLATELET PHERESIS: Unit division: 0

## 2017-08-18 LAB — BPAM PLATELET PHERESIS
BLOOD PRODUCT EXPIRATION DATE: 201908052359
ISSUE DATE / TIME: 201908052200
UNIT TYPE AND RH: 8400

## 2017-08-18 LAB — GLUCOSE, CAPILLARY
GLUCOSE-CAPILLARY: 109 mg/dL — AB (ref 70–99)
GLUCOSE-CAPILLARY: 118 mg/dL — AB (ref 70–99)
Glucose-Capillary: 101 mg/dL — ABNORMAL HIGH (ref 70–99)
Glucose-Capillary: 110 mg/dL — ABNORMAL HIGH (ref 70–99)
Glucose-Capillary: 148 mg/dL — ABNORMAL HIGH (ref 70–99)
Glucose-Capillary: 145 mg/dL — ABNORMAL HIGH (ref 70–99)

## 2017-08-18 LAB — LACTIC ACID, PLASMA
LACTIC ACID, VENOUS: 3 mmol/L — AB (ref 0.5–1.9)
Lactic Acid, Venous: 2.1 mmol/L (ref 0.5–1.9)
Lactic Acid, Venous: 2.1 mmol/L (ref 0.5–1.9)

## 2017-08-18 LAB — PHOSPHORUS
PHOSPHORUS: 2.7 mg/dL (ref 2.5–4.6)
Phosphorus: 3 mg/dL (ref 2.5–4.6)

## 2017-08-18 LAB — MAGNESIUM
MAGNESIUM: 1.8 mg/dL (ref 1.7–2.4)
Magnesium: 1.9 mg/dL (ref 1.7–2.4)

## 2017-08-18 LAB — PROTIME-INR
INR: 1.13
Prothrombin Time: 14.5 seconds (ref 11.4–15.2)

## 2017-08-18 MED ORDER — VITAL HIGH PROTEIN PO LIQD: 1000.0000 mL | ORAL | Status: DC

## 2017-08-18 MED ORDER — PANTOPRAZOLE SODIUM 40 MG IV SOLR
40.0000 mg | INTRAVENOUS | Status: DC
  Filled 2017-08-18: qty 40

## 2017-08-18 MED ORDER — PANTOPRAZOLE SODIUM 40 MG IV SOLR
40.0000 mg | Freq: Two times a day (BID) | INTRAVENOUS | Status: DC
  Administered 2017-08-18: 40 mg via INTRAVENOUS
  Filled 2017-08-18: qty 40

## 2017-08-18 MED ORDER — PIPERACILLIN-TAZOBACTAM 3.375 G IVPB 30 MIN
3.3750 g | Freq: Once | INTRAVENOUS | Status: AC
  Administered 2017-08-18: 3.375 g via INTRAVENOUS
  Filled 2017-08-18: qty 50

## 2017-08-18 MED ORDER — PRO-STAT SUGAR FREE PO LIQD
30.0000 mL | Freq: Two times a day (BID) | ORAL | Status: DC
  Filled 2017-08-18: qty 30

## 2017-08-18 MED ORDER — FENTANYL 2500MCG IN NS 250ML (10MCG/ML) PREMIX INFUSION
0.0000 ug/h | INTRAVENOUS | Status: DC
  Administered 2017-08-18: 25 ug/h via INTRAVENOUS
  Administered 2017-08-19: 50 ug/h via INTRAVENOUS
  Filled 2017-08-18 (×2): qty 250

## 2017-08-18 MED ORDER — VITAL HIGH PROTEIN PO LIQD
1000.0000 mL | ORAL | Status: DC
  Administered 2017-08-18 – 2017-08-19 (×2): 1000 mL

## 2017-08-18 MED ORDER — PIPERACILLIN-TAZOBACTAM 3.375 G IVPB
3.3750 g | Freq: Three times a day (TID) | INTRAVENOUS | Status: DC
  Administered 2017-08-18 – 2017-08-19 (×4): 3.375 g via INTRAVENOUS
  Filled 2017-08-18 (×4): qty 50

## 2017-08-18 NOTE — Progress Notes (Signed)
Initial Nutrition Assessment  DOCUMENTATION CODES:   Not applicable  INTERVENTION:   Tube Feeding:  Vital High Protein @ 50 ml/hr Provides 106 g of protein, 1200 kcals, 1008 mL of free water  NUTRITION DIAGNOSIS:   Inadequate oral intake related to acute illness as evidenced by NPO status.  GOAL:   Patient will meet greater than or equal to 90% of their needs  MONITOR:   TF tolerance, Vent status, Labs, Weight trends  REASON FOR ASSESSMENT:   Ventilator    ASSESSMENT:   77 yo female admitted with acute GI bleed, encephalopathy, pneumothorax s/p chest tube placement. Pt with hx of CVA, polymyositis, gastric leiomyoma, HTN, GERD, CHF. At baseline, pt is total care, wheelchair bound, chronic UE muscle weakness related to polymyositis. Pt is unable to feed self.   Patient is currently intubated on ventilator support MV: 6.1 L/min Temp (24hrs), Avg:98.4 F (36.9 C), Min:96.1 F (35.6 C), Max:99.3 F (37.4 C)  EGD performed no signs of bleeding OG tube to be placed today  Family at bedside and reports pt has experienced significant amount of weight loss over the past year. Need new weight this admission.  Family reports pt has been eating well up until the last few days.   Pt met clinical characteristics for moderate malnutrition on admission in June of this year. Pt appears to have experienced weight loss but need accurate weight this admission. Most muscle wasting likely related to polymyosistitis, all expect maybe the temporal region. However would not expect to see subcutaenous fat loss in patient who is adequately nourished . Pt likely malnourished but will reassess when able to get better weight and diet history.    Labs: reviewed Meds: NS at 125 ml/hr  NUTRITION - FOCUSED PHYSICAL EXAM:    Most Recent Value  Orbital Region  Mild depletion  Upper Arm Region  Severe depletion  Thoracic and Lumbar Region  Moderate depletion  Buccal Region  Unable to assess   Temple Region  Moderate depletion  Clavicle Bone Region  Moderate depletion  Clavicle and Acromion Bone Region  Moderate depletion  Scapular Bone Region  Moderate depletion  Dorsal Hand  Unable to assess  Patellar Region  Severe depletion  Anterior Thigh Region  Severe depletion  Posterior Calf Region  Severe depletion  Edema (RD Assessment)  None       Diet Order:   Diet Order           Diet NPO time specified  Diet effective now          EDUCATION NEEDS:   Not appropriate for education at this time  Skin:  Skin Assessment: Skin Integrity Issues: Skin Integrity Issues:: Other (Comment) Other: MASD: groin, sacrum  Last BM:  8/5  Height:   Ht Readings from Last 1 Encounters:  08/17/17 5' 2"  (1.575 m)    Weight:   Wt Readings from Last 1 Encounters:  08/17/17 140 lb (63.5 kg)    Ideal Body Weight:     BMI:  Body mass index is 25.61 kg/m.  Estimated Nutritional Needs:   Kcal:  1258 kcals  Protein:  95-115 g  Fluid:  >/= 1.5 L   Kerman Passey MS, RD, LDN, CNSC [phone] Pager  [phone] Weekend/On-Call Pager

## 2017-08-18 NOTE — Progress Notes (Signed)
CRITICAL VALUE ALERT  Critical Value:  Lactic 3.0  Date & Time Notied: 08/18/2017 0152  Provider Notified: Gladys Damme

## 2017-08-18 NOTE — Progress Notes (Addendum)
PULMONARY / CRITICAL CARE MEDICINE   Name: [name] [name] MRN: [id number] DOB: 02/04/1940    ADMISSION DATE:  08/17/2017 CONSULTATION DATE:  8/5  REFERRING MD:  Dr. Lorin Mercy  CHIEF COMPLAINT:  Confusion and blood per rectum with hypotension  HISTORY OF PRESENT ILLNESS:   77yoF with hx Polymyositis (on Prednisone 20mg  BID and Cellcept chronically), CVA (on ASA and Plavix), Gastric leiomyoma, HTN, GERD, and dCHF, who presented to ER this AM with BRBPR that began acutely this AM, episodes of hypotension and unresponsiveness in the ER, required intubation, which was difficult,  and received 2u pRBC per RN.  EGD performed by GI which showed no signs of bleeding and no further bleeding episodes since admit.  CXR post intubation noted for left sided pneumothorax.   SUBJECTIVE:  S/p Left pigtail CT last night for left PTX with resolution of PTX on am CXR Currently on PSV 10/5 x 1.5 hr prior to becoming agitated No reports of bleeding episodes   VITAL SIGNS: BP (!) 147/59   Pulse 80   Temp 98.3 F (36.8 C) (Oral)   Resp 14   Ht 5\' 2"  (1.575 m)   Wt 140 lb (63.5 kg)   SpO2 (!) 89%   BMI 25.61 kg/m   HEMODYNAMICS:    VENTILATOR SETTINGS: Vent Mode: PRVC FiO2 (%):  [30 %-60 %] 30 % Set Rate:  [14 bmp] 14 bmp Vt Set:  [450 mL] 450 mL PEEP:  [5 cmH20] 5 cmH20 Plateau Pressure:  [16 cmH20-18 cmH20] 17 cmH20  INTAKE / OUTPUT: I/O last 3 completed shifts: In: 5520.3 [I.V.:4496.1; Other:1; IV Piggyback:1023.2] Out: 950 [Urine:950]  PHYSICAL EXAMINATION: General:  Critically ill older female in NAD on MV HEENT: MM pink/moist, ETT, no OGT, pupils 3/ reactive Neuro: Arouses to verbal, follows simple commands, but otherwise restless in bed when stimulated on fent at 50 mcg CV: rrr, no m/r/g PULM: even/non-labored, lungs bilaterally clear, residual SQ air noted on chest, Left midax pigtail to sxn 20 cm, no fluctuation with sero/sang cloudy fluid JS:EGBT, non-tender, bs active   Extremities: warm/dry, no edema  Skin: no rashes   LABS:  BMET Recent Labs  Lab 08/15/17 08/17/17 0638 08/18/17 0416  NA 141 140 144  K 4.4 4.2 3.6  CL  --  107 113*  CO2  --  23 20*  BUN 31* 33* 20  CREATININE 0.4* 0.62 0.63  GLUCOSE  --  83 128*    Electrolytes Recent Labs  Lab 08/17/17 0638 08/18/17 0416  CALCIUM 9.0 7.9*    CBC Recent Labs  Lab 08/17/17 1731 08/18/17 0144 08/18/17 0752  WBC 24.1* 23.3* 22.7*  HGB 13.4 10.3* 10.5*  HCT 41.4 30.5* 31.2*  PLT 232 254 257    Coag's Recent Labs  Lab 08/17/17 0638 08/18/17 0416  INR 0.94 1.13    Sepsis Markers Recent Labs  Lab 08/17/17 2030 08/18/17 0113 08/18/17 0530  LATICACIDVEN 2.8* 3.0* 2.1*  PROCALCITON 0.27  --   --     ABG Recent Labs  Lab 08/17/17 1514  PHART 7.343*  PCO2ART 38.8  PO2ART 211.0*    Liver Enzymes Recent Labs  Lab 08/17/17 0638  AST 16  ALT 18  ALKPHOS 43  BILITOT 0.8  ALBUMIN 3.2*    Cardiac Enzymes No results for input(s): TROPONINI, PROBNP in the last 168 hours.  Glucose Recent Labs  Lab 08/17/17 1735 08/17/17 1940 08/17/17 2343 08/18/17 0407 08/18/17 0805  GLUCAP 99 164* 154* 101* 109*  Imaging  STUDIES:  CT head 8/5 >>  1. Motion degraded study without definite acute finding. 2. A small left cerebral white matter infarct was likely present on 06/09/2017 brain MRI. 3. Severe chronic small vessel ischemia.  CTA head/ neck 8/5 >> 1. No emergent large vessel occlusion or other acute finding. Stable from 06/09/2017. 2. Extensive atherosclerotic irregularity and narrowing of intracranial medium size vessels. 3. No significant atherosclerosis or stenosis in the neck.  8/5 EGD >> no active UGIB 8/5 CTA abd/ pelvis >> No evidence of gastrointestinal bleeding.  Severe narrowing just beyond the origin of the celiac axis.  Consolidation at the left lung base.  Large left pneumothorax.  Tiny right pneumothorax.  Extensive pneumomediastinum.   Postoperative changes in the lumbar spine. Stable L4 compression deformity.  CULTURES: 8/5 MRSA PCR >> neg 8/5 BC x 2 >> 8/5 Trach asp  >>  8/6 Left pleural fluid culture >>                          Gram stain  >>   ANTIBIOTICS: 8/5 Zosyn >>  SIGNIFICANT EVENTS: 8/5 Admit, intubated, EGD  LINES/TUBES: 8/5 ETT >> 8/5 foley >> PIV  R chest Port >>  ASSESSMENT / PLAN:  Shock- now resolved.  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 and now wheelchair and conversant at baseline - head CT, CTA of head and neck without acute changes 8/5 - UA negative P:  Monitor, frequent neuro checks Daily WUA RASS goal 0/-1 with fentanyl gtt and prn versed  Holding plavix and asa given acute bleed Consider MRI when stable  Suspected GIB with bright blood per rectum, on plavix and ASA ABLA  Probable malnutrition with low albumin and protein levels - Hx of oozing ulcerated cecal AVM years ago - EGD 8/5 showed no active UGI bleed - transfused 2 u PRBC and 1pk Plts, with Hgb trend 10.5 -> 13.4-> 10.3 -> 10.5 - CTA abd/ pelvis show no acute bleeding P:  Trend H/H q 6 Appreciate GI assistance  Can  SCDs only for VTE D/c protonix gtt, change to BID Continue NPO for now  CBG q 4 while NPO  Respiratory Insufficiency - difficult intubation, intubated due to confusion, hypotension, and emergent need for EGD Left Pneumothorax s/p pigtail CT 8/5, Small right pneumothorax noted on CT, small pneumomediastinum and  pneumopericardium Left lower lobe consolidation noted on CT Leukocytosis - non specific given chronic steroids - CXR this am with stable tubes; resolution of left PTX, no visible R PTX, left basilar opacity thought felt to be small effusion or atelectasis; mild pulm vas congestion  P:   Full MV support Daily SBT VAP bundle Continue left CT to 20 cm suction CXR now and in am to monitor PTX, may need CT to evaluate   Follow blood and sputum cx Treated empirically for sepsis, zosyn started yesterday given  LLL consolidation given immunosuppression, however patient remains afebrile, normotensive, and PCT reassuring.  Weaned PSV 10/5 for ~1.5 prior to becoming agitated.  Will send left pleural fluid for culture and gram stain and continue zosyn for now PPI for SUP  Adrenal Insufficiency  Polymyositis - - on chronic prednisone for polymyositis - recent flare on immunosuppressant meds P: Continue hold immunosuppressants  Continue stress dose steroids Cortisol level 8/5 at 53.8   Updates: No family at bedside.   CCT: 45 mins  Kennieth Rad, AGACNP-BC [facility] Pgr: [phone] or if no answer [phone] 08/18/2017, 9:48 AM  Attending Note:  77 year old female with extensive PMH who presents to PCCM for respiratory failure for UGI bleeding needing EGD.  Patient was intubated and EGD was performed that was negative.  Patient was also hypotensive.  On exam, decreased BS diffusely.  I reviewed CXR myself, ETT and pig tail noted.  Will repeat CXR today for right sided PTX, if worsens then will need to place a second pig tail.  Pneumomediastinum and pneumopericardium will be observed for now.  Pressors for BP support as needed.  Continue zosyn for now.  F/u on cultures.  Continue full vent support for now.  PCCM will continue to follow.  The patient is critically ill with multiple organ systems failure and requires high complexity decision making for assessment and support,  frequent evaluation and titration of therapies, application of advanced monitoring technologies and extensive interpretation of multiple databases.   Critical Care Time devoted to patient care services described in this note is  36  Minutes. This time reflects time of care of this signee Dr Jennet Maduro. This critical care time does not reflect procedure time, or teaching time or supervisory time of PA/NP/Med student/Med Resident etc but could involve care discussion time.  Rush Farmer, M.D. [facility] Pulmonary/Critical Care Medicine. Pager: [phone]. After hours pager: [phone].

## 2017-08-18 NOTE — Progress Notes (Signed)
Patient Arterial BP continues to fluctuate from low 100's-200's. Patient placed on  Fentanyl drip for management of comfort and sustained high BP. No rectal bleeding noted overnight. Increased Lactic overnight. Temp normalized. Warming blanket removed early in night. Reapplied at low setting for temp 97.6 axillary. Foley temp removed at 0500 due to bag malfunction.   Since chest tube placement, Left lung sounds very clear. Minimum output from chest tube

## 2017-08-18 NOTE — Progress Notes (Addendum)
[facility] Progress Note   Chief Complaint:   GI bleed   SUBJECTIVE:    awake, on vent.    ASSESSMENT AND PLAN:   1. 77 yo female with acute GI bleeding on Plavix and asa. Yesterday in ED she had large DARK bloody stool ( on iron ). She has a hx of a bleeding cecal AVM in 2015 but with elevated BUN,  ? hemorrhagic shock we wanted to rule out brisk upper GI bleed. EGD was unrevealing.  Etiology of bleeding not clear. She had a few diverticula on last colonscopy in 2016 so maybe diverticular hemorrhage? CTA abd/pelvis following EGD yesterday was unrevealing, no evidence for GI bleeding. There was severe celiac narrowing just beyond origin. Her IMA and SMA both patient but diminutive. She received platelets . Bleeding has resolved.  -she was empirically given a unit of blood yesterday. Hgb pre-transfusion was 10.5, today it is still at 10.5.  -No plans for additional endoscopic procedures at this point as bleeding has ceased. -shock has resolved. May have been multifactorial, not just from blood loss.  2. Encephalopathy, ongoing but likely multifactorial now. . Apparently she communicates normally at baseline and was doing so the night before admission. Now responds to painful stimuli but that is about it. Hx of CVA but head CT scan negative this admission.   3. Pneumothorax, large left one following difficult endotracheal intubation yesterday, s/p chest tube placement. No visible right pneumothorax on CXR today. A LLL consolidation also seen on CTA.    Attending physician's note   I have taken an interval history, reviewed the chart and examined the patient. I agree with the Advanced Practitioner's note, impression and recommendations.   77 year old with acute brisk lower GI bleeding on aspirin and Plavix.  Negative EGD.  Likely diverticular bleed.  Last colonoscopy 2016 showing few diverticula.  CTA negative.  Bleeding appears to have resolved.  Patient in respiratory  failure, ? Septic shock, pneumothorax after difficult endotracheal intubation s/p chest tube.  Patient critically ill.  No further GI intervention planned unless she starts bleeding again.  Continue supportive care.  Carmell Austria, MD  OBJECTIVE:     Vital signs in last 24 hours: Temp:  [96.1 F (35.6 C)-99.3 F (37.4 C)] 98.3 F (36.8 C) (08/06 0809) Pulse Rate:  [32-112] 82 (08/06 0900) Resp:  [13-33] 14 (08/06 0900) BP: (61-172)/(34-141) 126/67 (08/06 0900) SpO2:  [61 %-100 %] 100 % (08/06 0900) Arterial Line BP: (82-204)/(40-96) 140/48 (08/06 0900) FiO2 (%):  [30 %-60 %] 30 % (08/06 0800) Last BM Date: 08/17/17 General:   Alert, looking around, in NAD EENT:  ETT, OGT.  Heart:  Regular rate and rhythm; no lower extremity edema Pulm: [place] air in chest. Abdomen:  Soft, nondistended, slightly recoils with palpation of upper abdomen. A few bowel sounds.     Neurologic:  Alert and  oriented x4;  grossly normal neurologically. Psych:  Cannot assess  Intake/Output from previous day: 08/05 0701 - 08/06 0700 In: 5520.3 [I.V.:4496.1; IV Piggyback:1023.2] Out: 950 [Urine:950] Intake/Output this shift: Total I/O In: 432.2 [I.V.:417.3; IV Piggyback:14.9] Out: 135 [Urine:125; Chest Tube:10]  Lab Results: Recent Labs    08/17/17 1731 08/18/17 0144 08/18/17 0752  WBC 24.1* 23.3* 22.7*  HGB 13.4 10.3* 10.5*  HCT 41.4 30.5* 31.2*  PLT 232 254 257   BMET Recent Labs    08/17/17 0638 08/18/17 0416  NA 140 144  K 4.2 3.6  CL 107 113*  CO2 23  20*  GLUCOSE 83 128*  BUN 33* 20  CREATININE 0.62 0.63  CALCIUM 9.0 7.9*   LFT Recent Labs    08/17/17 0638  PROT 5.7*  ALBUMIN 3.2*  AST 16  ALT 18  ALKPHOS 43  BILITOT 0.8   PT/INR Recent Labs    08/17/17 0638 08/18/17 0416  LABPROT 12.5 14.5  INR 0.94 1.13      Ct Angio Head W Or Wo Contrast  Result Date: 08/17/2017 CLINICAL DATA:  Focal neuro deficit. Altered mental status. Periods of unresponsiveness EXAM: CT  ANGIOGRAPHY HEAD AND NECK TECHNIQUE: Multidetector CT imaging of the head and neck was performed using the standard protocol during bolus administration of intravenous contrast. Multiplanar CT image reconstructions and MIPs were obtained to evaluate the vascular anatomy. Carotid stenosis measurements (when applicable) are obtained utilizing NASCET criteria, using the distal internal carotid diameter as the denominator. CONTRAST:  128mL ISOVUE-370 IOPAMIDOL (ISOVUE-370) INJECTION 76% COMPARISON:  Noncontrast head CT earlier today. CTA head neck 06/09/2017 FINDINGS: CTA NECK FINDINGS Aortic arch: Normal.  Three vessel branching. Right carotid system: Limited by motion at the distal ICA. Visible vessels are smooth and widely patent. Left carotid system: Limited by motion at the distal left ICA. Visible vessels are smooth and widely patent. Vertebral arteries: No proximal subclavian flow limiting stenosis. Both vertebral arteries are patent in the neck. Skeleton: No acute finding Other neck: No acute finding. Upper chest: Porta catheter on the right.  No acute finding. Review of the MIP images confirms the above findings CTA HEAD FINDINGS Anterior circulation: No evidence of emergent large vessel occlusion or proximal flow limiting stenosis atherosclerotic narrowing and irregularity of medium size vessels (M2 level and beyond) that is extensive, see markings on MIP images. Negative for aneurysm. Posterior circulation: The vertebral and basilar arteries are smooth and widely patent. Symmetric flow in bilateral branches. Atherosclerotic irregularity of left more than right PCA branches. Negative for aneurysm Venous sinuses: Patent Anatomic variants: None significant Delayed phase: Not obtained Review of the MIP images confirms the above findings IMPRESSION: 1. No emergent large vessel occlusion or other acute finding. Stable from 06/09/2017. 2. Extensive atherosclerotic irregularity and narrowing of intracranial medium  size vessels. 3. No significant atherosclerosis or stenosis in the neck. Electronically Signed   By: Monte Fantasia M.D.   On: 08/17/2017 11:31   Ct Head Wo Contrast  Result Date: 08/17/2017 CLINICAL DATA:  Bilateral arm weakness EXAM: CT HEAD WITHOUT CONTRAST TECHNIQUE: Contiguous axial images were obtained from the base of the skull through the vertex without intravenous contrast. COMPARISON:  06/09/2017 brain MRI FINDINGS: Brain: Low-density in the posterior left frontal subcortical white matter is in close approximation to infarcts seen on 06/09/2017 brain MRI. There is a background of advanced chronic small vessel ischemia with confluent low-density in the cerebral white matter. Indistinct deep gray nuclei from chronic ischemic injury, including discrete remote lacunar infarct in the right thalamus. No evident hemorrhage, mass, or hydrocephalus. Subcentimeter left parietal inner table calcification considered incidental. Vascular: No hyperdense vessel.  Atherosclerotic calcification. Skull: No evidence of fracture or bone lesion. Sinuses/Orbits: No pathologic finding. Bilateral cataract resection. Other: Motion degraded scan, best obtainable due to patient condition. IMPRESSION: 1. Motion degraded study without definite acute finding. 2. A small left cerebral white matter infarct was likely present on 06/09/2017 brain MRI. 3. Severe chronic small vessel ischemia. Electronically Signed   By: Monte Fantasia M.D.   On: 08/17/2017 09:19   Ct Angio Neck W Or Wo  Contrast  Result Date: 08/17/2017 CLINICAL DATA:  Focal neuro deficit. Altered mental status. Periods of unresponsiveness EXAM: CT ANGIOGRAPHY HEAD AND NECK TECHNIQUE: Multidetector CT imaging of the head and neck was performed using the standard protocol during bolus administration of intravenous contrast. Multiplanar CT image reconstructions and MIPs were obtained to evaluate the vascular anatomy. Carotid stenosis measurements (when applicable) are  obtained utilizing NASCET criteria, using the distal internal carotid diameter as the denominator. CONTRAST:  115mL ISOVUE-370 IOPAMIDOL (ISOVUE-370) INJECTION 76% COMPARISON:  Noncontrast head CT earlier today. CTA head neck 06/09/2017 FINDINGS: CTA NECK FINDINGS Aortic arch: Normal.  Three vessel branching. Right carotid system: Limited by motion at the distal ICA. Visible vessels are smooth and widely patent. Left carotid system: Limited by motion at the distal left ICA. Visible vessels are smooth and widely patent. Vertebral arteries: No proximal subclavian flow limiting stenosis. Both vertebral arteries are patent in the neck. Skeleton: No acute finding Other neck: No acute finding. Upper chest: Porta catheter on the right.  No acute finding. Review of the MIP images confirms the above findings CTA HEAD FINDINGS Anterior circulation: No evidence of emergent large vessel occlusion or proximal flow limiting stenosis atherosclerotic narrowing and irregularity of medium size vessels (M2 level and beyond) that is extensive, see markings on MIP images. Negative for aneurysm. Posterior circulation: The vertebral and basilar arteries are smooth and widely patent. Symmetric flow in bilateral branches. Atherosclerotic irregularity of left more than right PCA branches. Negative for aneurysm Venous sinuses: Patent Anatomic variants: None significant Delayed phase: Not obtained Review of the MIP images confirms the above findings IMPRESSION: 1. No emergent large vessel occlusion or other acute finding. Stable from 06/09/2017. 2. Extensive atherosclerotic irregularity and narrowing of intracranial medium size vessels. 3. No significant atherosclerosis or stenosis in the neck. Electronically Signed   By: Monte Fantasia M.D.   On: 08/17/2017 11:31   Dg Chest Port 1 View  Result Date: 08/18/2017 CLINICAL DATA:  Pneumothorax.  Shortness of breath. EXAM: PORTABLE CHEST 1 VIEW COMPARISON:  One-view chest x-ray 08/17/2017.  FINDINGS: Endotracheal tube is stable. Right IJ catheter is stable. A left-sided chest tube is in place. No visible pneumothorax is present. Basilar atelectasis is present. Mild pulmonary vascular congestion is noted. IMPRESSION: 1. No significant pneumothorax with left-sided chest tube in place. 2. Left basilar airspace opacity likely representing small effusion and atelectasis. 3. Support apparatus is stable. Electronically Signed   By: San Morelle M.D.   On: 08/18/2017 07:07   Dg Chest Port 1 View  Result Date: 08/17/2017 CLINICAL DATA:  77 year old female with history of chest tube in the left side. EXAM: PORTABLE CHEST 1 VIEW COMPARISON:  Chest x-ray 08/17/2017. FINDINGS: Compared to the prior examination there has been interval placement of a small bore left chest tube with tip in the apex of the left hemithorax. Previously noted left-sided pneumothorax is significantly decreased in size compared to the prior examination, now likely less than 5-10% of the volume of the left hemithorax. Right internal jugular single-lumen porta cath with tip terminating near the superior cavoatrial junction. Patient is intubated, with the tip of the endotracheal tube approximately 3 cm above the carina. Lung volumes are low. Persistent elevation of the right hemidiaphragm. Bibasilar linear opacities and linear opacities in the left mid lung, likely to reflect areas of subsegmental atelectasis and/or scarring. No definite consolidative airspace disease. No pleural effusions. No evidence of pulmonary edema. Heart size appears borderline enlarged. The patient is rotated to the  left on today's exam, resulting in distortion of the mediastinal contours and reduced diagnostic sensitivity and specificity for mediastinal pathology. Aortic atherosclerosis. IMPRESSION: 1. Support apparatus, as above. 2. Regression of previously noted left-sided pneumothorax following chest tube placement, now extremely small. 3. Low lung  volumes with bibasilar areas of subsegmental atelectasis and/or scarring. Electronically Signed   By: Vinnie Langton M.D.   On: 08/17/2017 20:41   Dg Chest Port 1 View  Result Date: 08/17/2017 CLINICAL DATA:  Difficult intubation.  Orogastric tube placement. EXAM: PORTABLE CHEST 1 VIEW COMPARISON:  Portable chest x-ray of Jun 09, 2017 FINDINGS: The endotracheal tube tip projects 1-2 cm above the carina. There is considerable subcutaneous emphysema however. There is a 10% or less left pneumothorax. There is subsegmental atelectasis in the right lower lung and in the left mid lung. There is subcutaneous emphysema in the left axillary region and bilaterally at the base of the neck. The heart is normal in size. The pulmonary vascularity is not engorged. The porta catheter tip projects over the midportion of the SVC. No esophagogastric tube is observed. IMPRESSION: There is a 10% or less left sided pneumothorax. There is a large amount of subcutaneous emphysema and pneumomediastinum and a small amount of pneumopericardium. The endotracheal tube tip projects approximately 1-2 cm above the carina. Subsegmental atelectasis in both lungs. No CHF.  There is calcification in the wall of the aortic arch. These results will be called to the ordering clinician or representative by the Radiologist Assistant, and communication documented in the PACS or zVision Dashboard. Electronically Signed   By: David  Martinique M.D.   On: 08/17/2017 14:37   Dg Abd Portable 1v  Result Date: 08/17/2017 CLINICAL DATA:  Pre orogastric tube placement radiograph. EXAM: PORTABLE ABDOMEN - 1 VIEW COMPARISON:  Abdominal and pelvic CT scan of May 15, 2017 FINDINGS: There is a moderate amount of gas and stool within the pelvis. The patient has undergone previous lower lumbar fusion procedure. There numerous tubes and catheters overlying the lower pelvis. There is pneumomediastinum and pneumopericardium and likely hydropneumothorax on the left.  IMPRESSION: The bowel gas pattern is within the limits of normal. Please see the accompanying chest x-ray dictation regarding the left-sided pneumothorax, pneumomediastinum, and pneumopericardium. Electronically Signed   By: David  Martinique M.D.   On: 08/17/2017 14:39   Ct Angio Abd/pel W/ And/or W/o  Result Date: 08/17/2017 CLINICAL DATA:  Gastrointestinal bleeding EXAM: CTA ABDOMEN AND PELVIS wITHOUT AND WITH CONTRAST TECHNIQUE: Multidetector CT imaging of the abdomen and pelvis was performed using the standard protocol during bolus administration of intravenous contrast. Multiplanar reconstructed images and MIPs were obtained and reviewed to evaluate the vascular anatomy. CONTRAST:  171mL ISOVUE-370 IOPAMIDOL (ISOVUE-370) INJECTION 76% COMPARISON:  05/15/2017 FINDINGS: VASCULAR Gastrointestinal bleeding: There is no blush of contrast within the gastrointestinal tract to suggest active gastrointestinal bleeding. Aorta: Nonaneurysmal and patent. Scattered atherosclerotic calcifications. Celiac: Severe narrowing just beyond the origin. Branch vessels patent. SMA: Patent and diminutive. Renals: Bilateral single renal arteries are patent. IMA: Diminutive and patent. Inflow: Common, internal, and external iliac arteries are patent. Proximal Outflow: Grossly patent Veins: No obvious DVT. Review of the MIP images confirms the above findings. NON-VASCULAR Lower chest: There is consolidation at the left lung base. A large left pneumothorax is present. There is a large amount of mediastinal emphysema. There is a tiny right anterior pneumothorax. There is emphysema in the anterior chest soft tissues. Hepatobiliary: Tiny hypodensity in the right lobe of the liver towards the  dome. It is stable. Gallbladder is within normal limits. Pancreas: Unremarkable Spleen: Unremarkable Adrenals/Urinary Tract: Benign cysts are present in both kidneys. There is high-density material in the collecting system of both kidneys which conforms  to the collecting system likely representing a test dose of contrast with excretion. Adrenal glands are within normal limits. Foley catheter decompresses the bladder. There is bladder wall thickening. Stomach/Bowel: Stomach and duodenum are decompressed. No evidence of small-bowel obstruction. Sigmoid diverticulosis is present. There is no evidence of acute diverticulitis. Lymphatic: No abnormal retroperitoneal adenopathy. Reproductive: Uterus is absent.  Adnexa are within normal limits. Other: No free fluid. Musculoskeletal: Postoperative changes from posterior L4-5 fusion. L4 superior anterior endplate compression fracture is stable. IMPRESSION: VASCULAR No evidence of gastrointestinal bleeding. Severe narrowing just beyond the origin of the celiac axis. NON-VASCULAR Consolidation at the left lung base. Large left pneumothorax. Tiny right pneumothorax. Extensive pneumomediastinum. Postoperative changes in the lumbar spine. Stable L4 compression deformity. Findings related to the left pneumothorax were discussed with Dr. Debbora Dus. Electronically Signed   By: Marybelle Killings M.D.   On: 08/17/2017 18:47   Active Problems:   Polymyositis (HCC)   Adrenal insufficiency (HCC)   Acute blood loss anemia   CVA (cerebral vascular accident) ([place])   Acute upper GI bleed   Nontraumatic hemorrhagic shock ([place])     LOS: 1 day

## 2017-08-18 NOTE — Progress Notes (Signed)
[facility] Progress Note Patient Name: [name] [name] DOB: 04/07/1940 MRN: [id number]   Date of Service  08/18/2017  HPI/Events of Note  Persistent and increasing lactate with normal BP  eICU Interventions  Add empiric zosyn since patient immunocompromised.     Intervention Category Major Interventions: Sepsis - evaluation and management  Rishawn Walck 08/18/2017, 5:09 AM

## 2017-08-18 NOTE — Care Management Note (Signed)
Case Management Note  Patient Details  Name: [name] [name] MRN: [id number] Date of Birth: [birth year]  Subjective/Objective:  From [facility], presents with , shock, acute encephalopathy, suspected gib with ABLA,  resp insuff on vent left ptx with chest tube to suction, adrenal insuff, polymyositis.                  Action/Plan: NCM will follow for transition of care needs.   Expected Discharge Date:                  Expected Discharge Plan:  Skilled Nursing Facility  In-House Referral:  Clinical Social Work  Discharge planning Services  CM Consult  Post Acute Care Choice:    Choice offered to:     DME Arranged:    DME Agency:     HH Arranged:    [place] Agency:     Status of Service:  In process, will continue to follow  If discussed at Long Length of Stay Meetings, dates discussed:    Additional Comments:  Zenon Mayo, RN 08/18/2017, 3:46 PM

## 2017-08-18 NOTE — Progress Notes (Signed)
Pharmacy Antibiotic Note  [name] [name] is a 78 y.o. female admitted on 08/17/2017 with sepsis.  Pharmacy has been consulted for Zosyn dosing. SCr 0.62 on admit.  Plan: Zosyn 3.375g IV (4min inf) x1; then 3.375g IV q8h (4h inf) Monitor clinical progress, c/s, renal function F/u de-escalation plan/LOT Rx will s/o consult and monitor peripherally  Height: 5\' 2"  (157.5 cm) Weight: 140 lb (63.5 kg) IBW/kg (Calculated) : 50.1  Temp (24hrs), Avg:98.3 F (36.8 C), Min:96.1 F (35.6 C), Max:99.3 F (37.4 C)  Recent Labs  Lab 08/15/17 08/17/17 0638 08/17/17 1731 08/17/17 2030 08/18/17 0113 08/18/17 0144  WBC 9.1  9.1 9.6 24.1*  --   --  23.3*  CREATININE 0.4* 0.62  --   --   --   --   LATICACIDVEN  --   --   --  2.8* 3.0*  --     Estimated Creatinine Clearance: 51.6 mL/min (by C-G formula based on SCr of 0.62 mg/dL).    Allergies  Allergen Reactions  . Codeine Other (See Comments)    Feels drunk  . Hydrocodone     "makes me sleepy"  . Tramadol Hcl Other (See Comments)    Makes me feel drunk    Elicia Lamp, PharmD, BCPS Clinical Pharmacist 08/18/2017 5:11 AM

## 2017-08-19 ENCOUNTER — Inpatient Hospital Stay (HOSPITAL_COMMUNITY): Payer: Medicare Other

## 2017-08-19 DIAGNOSIS — J9383 Other pneumothorax: Secondary | ICD-10-CM

## 2017-08-19 LAB — BASIC METABOLIC PANEL
ANION GAP: 9 (ref 5–15)
BUN: 19 mg/dL (ref 8–23)
CO2: 20 mmol/L — AB (ref 22–32)
Calcium: 7.6 mg/dL — ABNORMAL LOW (ref 8.9–10.3)
Chloride: 116 mmol/L — ABNORMAL HIGH (ref 98–111)
Creatinine, Ser: 0.49 mg/dL (ref 0.44–1.00)
GLUCOSE: 166 mg/dL — AB (ref 70–99)
POTASSIUM: 2.7 mmol/L — AB (ref 3.5–5.1)
Sodium: 145 mmol/L (ref 135–145)

## 2017-08-19 LAB — POCT I-STAT 3, ART BLOOD GAS (G3+)
Acid-base deficit: 7 mmol/L — ABNORMAL HIGH (ref 0.0–2.0)
Bicarbonate: 18.6 mmol/L — ABNORMAL LOW (ref 20.0–28.0)
O2 Saturation: 99 %
PCO2 ART: 35.6 mmHg (ref 32.0–48.0)
PH ART: 7.327 — AB (ref 7.350–7.450)
TCO2: 20 mmol/L — ABNORMAL LOW (ref 22–32)
pO2, Arterial: 154 mmHg — ABNORMAL HIGH (ref 83.0–108.0)

## 2017-08-19 LAB — CBC
HEMATOCRIT: 25.5 % — AB (ref 36.0–46.0)
Hemoglobin: 8.5 g/dL — ABNORMAL LOW (ref 12.0–15.0)
MCH: 27.1 pg (ref 26.0–34.0)
MCHC: 33.3 g/dL (ref 30.0–36.0)
MCV: 81.2 fL (ref 78.0–100.0)
Platelets: 200 10*3/uL (ref 150–400)
RBC: 3.14 MIL/uL — AB (ref 3.87–5.11)
RDW: 18.1 % — ABNORMAL HIGH (ref 11.5–15.5)
WBC: 13.1 10*3/uL — AB (ref 4.0–10.5)

## 2017-08-19 LAB — GLUCOSE, CAPILLARY
GLUCOSE-CAPILLARY: 149 mg/dL — AB (ref 70–99)
GLUCOSE-CAPILLARY: 104 mg/dL — AB (ref 70–99)
GLUCOSE-CAPILLARY: 138 mg/dL — AB (ref 70–99)
Glucose-Capillary: 156 mg/dL — ABNORMAL HIGH (ref 70–99)
Glucose-Capillary: 111 mg/dL — ABNORMAL HIGH (ref 70–99)
Glucose-Capillary: 85 mg/dL (ref 70–99)

## 2017-08-19 LAB — PHOSPHORUS: PHOSPHORUS: 2.3 mg/dL — AB (ref 2.5–4.6)

## 2017-08-19 LAB — MAGNESIUM: Magnesium: 2 mg/dL (ref 1.7–2.4)

## 2017-08-19 MED ORDER — PREDNISONE 20 MG PO TABS
20.0000 mg | ORAL_TABLET | Freq: Two times a day (BID) | ORAL | Status: DC
  Administered 2017-08-19 – 2017-08-20 (×2): 20 mg
  Filled 2017-08-19 (×2): qty 1

## 2017-08-19 MED ORDER — PANTOPRAZOLE SODIUM 40 MG PO PACK
40.0000 mg | PACK | Freq: Every day | ORAL | Status: DC
  Administered 2017-08-19 – 2017-08-20 (×2): 40 mg
  Filled 2017-08-19 (×2): qty 20

## 2017-08-19 MED ORDER — HYDROCORTISONE NA SUCCINATE PF 100 MG IJ SOLR: 50.0000 mg | Freq: Every day | INTRAMUSCULAR | Status: DC

## 2017-08-19 MED ORDER — PREDNISONE 20 MG PO TABS: 20.0000 mg | ORAL_TABLET | Freq: Three times a day (TID) | ORAL | Status: DC

## 2017-08-19 MED ORDER — POTASSIUM CHLORIDE 20 MEQ/15ML (10%) PO SOLN
40.0000 meq | ORAL | Status: AC
  Administered 2017-08-19 (×2): 40 meq
  Filled 2017-08-19 (×2): qty 30

## 2017-08-19 MED ORDER — FREE WATER
200.0000 mL | Freq: Three times a day (TID) | Status: DC
  Administered 2017-08-19 – 2017-08-20 (×4): 200 mL

## 2017-08-19 MED ORDER — AMPICILLIN-SULBACTAM SODIUM 3 (2-1) G IJ SOLR
3.0000 g | Freq: Three times a day (TID) | INTRAMUSCULAR | Status: DC
  Administered 2017-08-19 – 2017-08-25 (×18): 3 g via INTRAVENOUS
  Filled 2017-08-19 (×19): qty 3

## 2017-08-19 NOTE — Progress Notes (Signed)
CRITICAL VALUE ALERT  Critical Value:  Potassium 2.7  Date & Time Notied:  08/19/2017 06:37  Provider Notified: Dr Rosita Fire  Orders Received/Actions taken: orders to be placed to replace

## 2017-08-19 NOTE — Progress Notes (Addendum)
PULMONARY / CRITICAL CARE MEDICINE   Name: [name] [name] MRN: [id number] DOB: 28-Apr-1940    ADMISSION DATE:  08/17/2017 CONSULTATION DATE:  8/5  REFERRING MD:  Dr. Lorin Mercy  CHIEF COMPLAINT:  Confusion and blood per rectum with hypotension  HISTORY OF PRESENT ILLNESS:   77yoF with hx Polymyositis (on Prednisone 20mg  BID and Cellcept chronically), CVA (on ASA and Plavix), Gastric leiomyoma, HTN, GERD, and dCHF, who presented to ER this AM with BRBPR that began acutely this AM, episodes of hypotension and unresponsiveness in the ER, required intubation, which was difficult,  and received 2u pRBC per RN.  EGD performed by GI which showed no signs of bleeding and no further bleeding episodes since admit.  CXR post intubation noted for left sided pneumothorax.   SUBJECTIVE:  No air leak left chest tube plan to put to waterseal  VITAL SIGNS: BP (!) 150/74   Pulse 86   Temp 98 F (36.7 C) (Oral)   Resp 16   Ht 5\' 2"  (1.575 m)   Wt 150 lb 12.7 oz (68.4 kg)   SpO2 100%   BMI 27.58 kg/m   HEMODYNAMICS:    VENTILATOR SETTINGS: Vent Mode: PRVC FiO2 (%):  [30 %] 30 % Set Rate:  [14 bmp] 14 bmp Vt Set:  [450 mL] 450 mL PEEP:  [5 cmH20] 5 cmH20 Plateau Pressure:  [10 cmH20-21 cmH20] 10 cmH20  INTAKE / OUTPUT: I/O last 3 completed shifts: In: 6664.9 [I.V.:4697.6; NG/GT:879.2; IV Piggyback:1088.2] Out: 7510 [Urine:1050; Chest Tube:14]  PHYSICAL EXAMINATION: 77yoF with hx Polymyositis (on Prednisone 20mg  BID and Cellcept chronically), CVA (on ASA and Plavix), Gastric leiomyoma, HTN, GERD, and dCHF, who presented to ER this AM with BRBPR that began acutely this AM, episodes of hypotension and unresponsiveness in the ER, required intubation, which was difficult,  and received 2u pRBC per RN. HEENT: Tracheal tube in place gastric tube in place Neuro: Does not follow commands.  Some spontaneous movement of CV: Heart sounds are regular PULM: even/non-labored, lungs bilaterally decreased in the bases.  Left chest tube without air leak without drainage, plan to put to waterseal CH:ENID, non-tender, bsx4 active  Extremities: warm/dry, 1+ edema, contractures of the lower extremities Skin: no rashes or lesions   LABS:  BMET Recent Labs  Lab  08/17/17 0638 08/18/17 0416 08/19/17 0500  NA 140 144 145  K 4.2 3.6 2.7*  CL 107 113* 116*  CO2 23 20* 20*  BUN 33* 20 19  CREATININE 0.62 0.63 0.49  GLUCOSE 83 128* 166*    Electrolytes Recent Labs  Lab 08/17/17 0638 08/18/17 0416 08/18/17 1129 08/18/17 1712 08/19/17 0500  CALCIUM 9.0 7.9*  --   --  7.6*  MG  --   --  1.8 1.9 2.0  PHOS  --   --  3.0 2.7 2.3*    CBC Recent Labs  Lab 08/18/17 1317 08/18/17 2000 08/19/17 0500  WBC 19.6* 16.6* 13.1*  HGB 9.6* 9.1* 8.5*  HCT 28.7* 27.9* 25.5*  PLT 235 222 200    Coag's Recent Labs  Lab 08/17/17 0638 08/18/17 0416  INR 0.94 1.13    Sepsis Markers Recent Labs  Lab 08/17/17 2030 08/18/17 0113 08/18/17 0530 08/18/17 1042  LATICACIDVEN 2.8* 3.0* 2.1* 2.1*  PROCALCITON 0.27  --   --   --     ABG Recent Labs  Lab 08/17/17 1514 08/19/17 0325  PHART 7.343* 7.327*  PCO2ART 38.8 35.6  PO2ART 211.0* 154.0*    Liver Enzymes Recent Labs  Lab 08/17/17 0638  AST 16  ALT 18  ALKPHOS 43  BILITOT 0.8  ALBUMIN 3.2*    Cardiac Enzymes No results for input(s): TROPONINI, PROBNP in the last 168 hours.  Glucose Recent Labs  Lab  08/18/17 1156 08/18/17 1602 08/18/17 1937 08/18/17 2317 08/19/17 0318 08/19/17 0725  GLUCAP 110* 148* 118* 145* 149* 156*    Imaging  STUDIES:  CT head 8/5 >>  1. Motion degraded study without definite acute finding. 2. A small left cerebral white matter infarct was likely present on 06/09/2017 brain MRI. 3. Severe chronic small vessel ischemia.  CTA head/ neck 8/5 >> 1. No emergent large vessel occlusion or other acute finding. Stable from 06/09/2017. 2. Extensive atherosclerotic irregularity and narrowing of intracranial medium size vessels. 3. No significant atherosclerosis or stenosis in the neck.  8/5 EGD >> no active UGIB 8/5 CTA abd/ pelvis >> No evidence of gastrointestinal bleeding.  Severe narrowing just beyond the origin of the celiac axis.   Consolidation at the left lung base.  Large left pneumothorax.  Tiny right pneumothorax.  Extensive pneumomediastinum.  Postoperative changes in the lumbar spine. Stable L4 compression deformity.  CULTURES: 8/5 MRSA PCR >> neg 8/5 BC x 2 >> 8/5 Trach asp  >>  8/6 Left pleural fluid culture >>                          Gram stain  >>   ANTIBIOTICS: 8/5 Zosyn >>  SIGNIFICANT EVENTS: 8/5 Admit, intubated, EGD  LINES/TUBES: 8/5 ETT >> 8/5 foley >> 85 2019 left chest tube pigtail>> PIV  R chest Port >>  ASSESSMENT / PLAN:  Shock- now resolved.  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 and now wheelchair and conversant at baseline - head CT, CTA of head and neck without acute changes 8/5 - UA negative P:  Decrease sedation as able Frequent neurological checks Plavix on hold due to GI bleeding    Suspected GIB with bright blood per rectum, on plavix and ASA ABLA  Probable malnutrition with low albumin and protein levels - Hx of oozing ulcerated cecal AVM years ago - EGD 8/5 showed no active UGI bleed - transfused 2 u PRBC and 1pk Plts, with Hgb trend 10.5 -> 13.4-> 10.3 -> 10.5 - CTA abd/ pelvis show no acute bleeding P:  Trend CBC Transfuse per protocol Continue tube feeding GI following   Respiratory Insufficiency - difficult intubation, intubated due to confusion, hypotension, and emergent need for EGD Left Pneumothorax s/p pigtail CT 8/5, Small right pneumothorax noted on CT, small pneumomediastinum and pneumopericardium Left lower lobe consolidation noted on CT Leukocytosis - non specific given chronic  steroids - CXR this am with stable tubes; resolution of left PTX, no visible R PTX, left basilar opacity thought felt to be small effusion or atelectasis; mild pulm vas congestion  P:   Vent bundle Currently not overbreathing the vent Left chest tube 20 cm of suction, 2019 Change to waterseal follow-up chest x-ray a.m. of 08/20/2017 Wean as tolerated from ventilator pulmonary 07/2017 failed weaning  Adrenal Insufficiency  Polymyositis Hypernatremia Recent Labs  Lab 08/17/17 0638 08/18/17 0416 08/19/17 0500  NA 140 144 145    - - on chronic prednisone for polymyositis - recent flare on immunosuppressant meds P: Hold immunosuppressive Continue current stress dose steroids Free water  Updates: 08/19/2017 no family at bedside  CCT: 30 mins   Richardson Landry Minor ACNP Maryanna Shape PCCM Pager [phone] till 1 pm If no answer page [phone] 08/19/2017, 10:15 AM  Attending Note:  77 year old female with extensive PMH who presents to the hospital with GI bleed, respiratory failure and bilateral PTX, pneumomediastinum and pneumopericardium.  Patient is improving from a respiratory standpoint but mental status remains very poor precluding extubation.  I reviewed CXR myself, PTX noted, CT in place and ETT is in a good position.  Will hold of extubation.  Continue weaning as able.  Change stress dose steroids to home dose PO steroids.  D/C zosyn.  Start unasyn for aspiration and f/u on cultures.  PCCM will continue to follow.  Will need discussion for trach/peg next week if mental status remains poor.  Will repeat head CT now.  The patient is critically ill with multiple organ systems failure and requires high complexity decision making for assessment and support, frequent evaluation and titration of therapies, application of advanced monitoring technologies and extensive interpretation of multiple databases.   Critical Care Time devoted to patient care services described in this note is  32  Minutes.  This time reflects time of care of this signee Dr Jennet Maduro. This critical care time does not reflect procedure time, or teaching time or supervisory time of PA/NP/Med student/Med Resident etc but could involve care discussion time.  Rush Farmer, M.D. [facility]. Pager: [phone]. After hours pager: [phone].

## 2017-08-19 NOTE — Progress Notes (Signed)
Patient transported to CT and back to 3R17 without complications. Suctioned before and after.

## 2017-08-19 NOTE — Progress Notes (Addendum)
[facility] Progress Note   Chief Complaint:   BI bleed    SUBJECTIVE:    Nurse in room. Patient failed weaning trials.  A smear of maroon blood today, no other GI bleeding   ASSESSMENT AND PLAN:   1. 77 yo female with acute GI bleed / shock on Plavix and asa. Resolving  DARK bloody stool ( on iron ). So EGD done to rule out upper bleed but it was negative.  CTA abd/pelvis following EGD was unrevealing, This may have been a diverticular hemorrhage. Recurrent colonic AVM bleed another possibility but volume of bleeding makes that seem less likely . Plavix is still on hold -Mild drift in hgb overnight , 9.1 >>> 8.5.  Just a "smear" of maroon stool today per RN -No need for further endoscopic workup right now -shock resolved and may have been multifactorial with bleeding , possibly from pneumothorax, ? Sepsis   2. Encephalopathy, likely multifactorial. Hx of CVA but head CT scan negative this admission. She is slightly more alert and responsive today  3. Respiratory insufficiency . Left pneumothorax one following difficult endotracheal intubation prior to EGD, s/p chest tube placement.  She has failed weaning trials today   Attending physician's note   I have reviewed the chart and examined the patient. I agree with the Advanced Practitioner's note, impression and recommendations.   77 year old with acute brisk lower GI bleeding on aspirin and Plavix.  Negative EGD.  Likely diverticular bleed.  Last colonoscopy 2016 showing few diverticula.  CTA negative.  Bleeding appears to have resolved. Had small maroon stool today. Patient in respiratory failure, ? Septic shock, pneumothorax after difficult endotracheal intubation s/p chest tube.  Patient critically ill.  No further GI intervention planned unless she starts bleeding again.  Continue supportive care. Trend CBC. Will follow along.  Carmell Austria, MD   OBJECTIVE:     Vital signs in last 24 hours: Temp:  [98 F  (36.7 C)-98.8 F (37.1 C)] 98.5 F (36.9 C) (08/07 1213) Pulse Rate:  [76-100] 77 (08/07 1200) Resp:  [14-22] 14 (08/07 1200) BP: (132-159)/(54-116) 159/79 (08/07 1200) SpO2:  [97 %-100 %] 100 % (08/07 1200) Arterial Line BP: (96-193)/(42-75) 177/66 (08/07 1200) FiO2 (%):  [30 %] 30 % (08/07 1200) Weight:  [143 lb 15.4 oz (65.3 kg)-150 lb 12.7 oz (68.4 kg)] 150 lb 12.7 oz (68.4 kg) (08/07 0500) Last BM Date: 08/17/17 General:   Alert, female in NAD  Heart:  Regular rate and rhythm, no lower extremity edema Pulm: Tracheal tube. Normal respiratory effort Abdomen:  Soft, nondistended, nontender.  A few bowel sounds, no masses felt.     Neurologic:  Alert and makes eye contact but doesn't respond in any way to my questions     Intake/Output from previous day: 08/06 0701 - 08/07 0700 In: 4267.2 [I.V.:3323.1; NG/GT:879.2; IV Piggyback:64.9] Out: 679 [Urine:665; Chest Tube:14] Intake/Output this shift: Total I/O In: -  Out: 135 [Urine:135]  Lab Results: Recent Labs    08/18/17 1317 08/18/17 2000 08/19/17 0500  WBC 19.6* 16.6* 13.1*  HGB 9.6* 9.1* 8.5*  HCT 28.7* 27.9* 25.5*  PLT 235 222 200   BMET Recent Labs    08/17/17 0638 08/18/17 0416 08/19/17 0500  NA 140 144 145  K 4.2 3.6 2.7*  CL 107 113* 116*  CO2 23 20* 20*  GLUCOSE 83 128* 166*  BUN 33* 20 19  CREATININE 0.62 0.63 0.49  CALCIUM 9.0 7.9* 7.6*   LFT Recent  Labs    08/17/17 0638  PROT 5.7*  ALBUMIN 3.2*  AST 16  ALT 18  ALKPHOS 43  BILITOT 0.8   PT/INR Recent Labs    08/17/17 0638 08/18/17 0416  LABPROT 12.5 14.5  INR 0.94 1.13     Ct Head Wo Contrast  Result Date: 08/19/2017 CLINICAL DATA:  Altered mental status.  Cephalopathy. EXAM: CT HEAD WITHOUT CONTRAST TECHNIQUE: Contiguous axial images were obtained from the base of the skull through the vertex without intravenous contrast. COMPARISON:  08/17/2017. FINDINGS: Brain: Diffusely enlarged ventricles and subarachnoid spaces. Patchy  white matter low density in both cerebral hemispheres. Streak artifacts from a high density lead attached to the right frontal bone. No intracranial hemorrhage, mass lesion or CT evidence of acute infarction. Vascular: No hyperdense vessel or unexpected calcification. Skull: Normal. Negative for fracture or focal lesion. Sinuses/Orbits: Unremarkable. Other: None. IMPRESSION: 1. No acute abnormality. 2. Stable mild diffuse cerebral and cerebellar atrophy. 3. Stable marked chronic small vessel white matter ischemic changes in both cerebral hemispheres. Electronically Signed   By: Claudie Revering M.D.   On: 08/19/2017 13:50   Dg Chest Port 1 View  Result Date: 08/19/2017 CLINICAL DATA:  Endotracheal tube is assessment EXAM: PORTABLE CHEST 1 VIEW COMPARISON:  August 18, 2017 FINDINGS: The heart size and mediastinal contours are stable. Endotracheal tube is identified with distal tip 4.5 cm from carina in good position. A nasogastric tube is identified with distal tip not included on film but is at least in the stomach. A right central venous line is identified with distal tip in the superior vena cava, unchanged. Left chest tube is unchanged. There is pulmonary vascular congestion. Probable left pleural effusion is identified. Consolidation of left lung base is unchanged. The visualized skeletal structures are stable. IMPRESSION: Endotracheal tube is identified with distal tip 4.5 cm from carina, in good position. Pulmonary vascular congestion of the lungs. There is probable left pleural effusion with consolidation of left lung base unchanged compared prior exam. Electronically Signed   By: Abelardo Diesel M.D.   On: 08/19/2017 09:32   Dg Chest Port 1 View  Result Date: 08/18/2017 CLINICAL DATA:  Pneumothorax image timed for 1100hrs.  OG placement. EXAM: PORTABLE CHEST 1 VIEW COMPARISON:  08/18/2017 FINDINGS: Endotracheal tube is in place with tip approximately 3.5 centimeters above the carina. An orogastric tube is in  place, tip off the image beyond the gastroesophageal junction. The patient has a RIGHT-sided Port-A-Cath with tip to level of superior vena cava. The patient has a LEFT-sided small bore chest tube. There is no pneumothorax. The heart is enlarged. Patient is rotated towards the LEFT. There is dense opacity at the LEFT lung base which obscures the LEFT hemidiaphragm. Mild subsegmental atelectasis in the RIGHT LOWER lobe, stable in appearance. IMPRESSION: 1. No pneumothorax. 2. Persistent dense LEFT LOWER lobe opacity. 3. Orogastric tube tip beyond the gastroesophageal junction. Electronically Signed   By: Nolon Nations M.D.   On: 08/18/2017 11:31   Dg Chest Port 1 View  Result Date: 08/18/2017 CLINICAL DATA:  Pneumothorax.  Shortness of breath. EXAM: PORTABLE CHEST 1 VIEW COMPARISON:  One-view chest x-ray 08/17/2017. FINDINGS: Endotracheal tube is stable. Right IJ catheter is stable. A left-sided chest tube is in place. No visible pneumothorax is present. Basilar atelectasis is present. Mild pulmonary vascular congestion is noted. IMPRESSION: 1. No significant pneumothorax with left-sided chest tube in place. 2. Left basilar airspace opacity likely representing small effusion and atelectasis. 3. Support apparatus is  stable. Electronically Signed   By: San Morelle M.D.   On: 08/18/2017 07:07   Dg Abd Portable 1v  Result Date: 08/17/2017 CLINICAL DATA:  Pre orogastric tube placement radiograph. EXAM: PORTABLE ABDOMEN - 1 VIEW COMPARISON:  Abdominal and pelvic CT scan of May 15, 2017 FINDINGS: There is a moderate amount of gas and stool within the pelvis. The patient has undergone previous lower lumbar fusion procedure. There numerous tubes and catheters overlying the lower pelvis. There is pneumomediastinum and pneumopericardium and likely hydropneumothorax on the left. IMPRESSION: The bowel gas pattern is within the limits of normal. Please see the accompanying chest x-ray dictation regarding the  left-sided pneumothorax, pneumomediastinum, and pneumopericardium. Electronically Signed   By: David  Martinique M.D.   On: 08/17/2017 14:39   Ct Angio Abd/pel W/ And/or W/o  Result Date: 08/17/2017 CLINICAL DATA:  Gastrointestinal bleeding EXAM: CTA ABDOMEN AND PELVIS wITHOUT AND WITH CONTRAST TECHNIQUE: Multidetector CT imaging of the abdomen and pelvis was performed using the standard protocol during bolus administration of intravenous contrast. Multiplanar reconstructed images and MIPs were obtained and reviewed to evaluate the vascular anatomy. CONTRAST:  110mL ISOVUE-370 IOPAMIDOL (ISOVUE-370) INJECTION 76% COMPARISON:  05/15/2017 FINDINGS: VASCULAR Gastrointestinal bleeding: There is no blush of contrast within the gastrointestinal tract to suggest active gastrointestinal bleeding. Aorta: Nonaneurysmal and patent. Scattered atherosclerotic calcifications. Celiac: Severe narrowing just beyond the origin. Branch vessels patent. SMA: Patent and diminutive. Renals: Bilateral single renal arteries are patent. IMA: Diminutive and patent. Inflow: Common, internal, and external iliac arteries are patent. Proximal Outflow: Grossly patent Veins: No obvious DVT. Review of the MIP images confirms the above findings. NON-VASCULAR Lower chest: There is consolidation at the left lung base. A large left pneumothorax is present. There is a large amount of mediastinal emphysema. There is a tiny right anterior pneumothorax. There is emphysema in the anterior chest soft tissues. Hepatobiliary: Tiny hypodensity in the right lobe of the liver towards the dome. It is stable. Gallbladder is within normal limits. Pancreas: Unremarkable Spleen: Unremarkable Adrenals/Urinary Tract: Benign cysts are present in both kidneys. There is high-density material in the collecting system of both kidneys which conforms to the collecting system likely representing a test dose of contrast with excretion. Adrenal glands are within normal limits.  Foley catheter decompresses the bladder. There is bladder wall thickening. Stomach/Bowel: Stomach and duodenum are decompressed. No evidence of small-bowel obstruction. Sigmoid diverticulosis is present. There is no evidence of acute diverticulitis. Lymphatic: No abnormal retroperitoneal adenopathy. Reproductive: Uterus is absent.  Adnexa are within normal limits. Other: No free fluid. Musculoskeletal: Postoperative changes from posterior L4-5 fusion. L4 superior anterior endplate compression fracture is stable. IMPRESSION: VASCULAR No evidence of gastrointestinal bleeding. Severe narrowing just beyond the origin of the celiac axis. NON-VASCULAR Consolidation at the left lung base. Large left pneumothorax. Tiny right pneumothorax. Extensive pneumomediastinum. Postoperative changes in the lumbar spine. Stable L4 compression deformity. Findings related to the left pneumothorax were discussed with Dr. Debbora Dus. Electronically Signed   By: Marybelle Killings M.D.   On: 08/17/2017 18:47    Active Problems:   Polymyositis ([place])   Adrenal insufficiency ([facility])   Acute blood loss anemia   CVA (cerebral vascular accident) ([place])   Acute upper GI bleed   Nontraumatic hemorrhagic shock ([place])     LOS: 2 days   Tye Savoy ,NP 08/19/2017, 2:03 PM

## 2017-08-20 ENCOUNTER — Inpatient Hospital Stay (HOSPITAL_COMMUNITY): Payer: Medicare Other

## 2017-08-20 DIAGNOSIS — J9600 Acute respiratory failure, unspecified whether with hypoxia or hypercapnia: Secondary | ICD-10-CM

## 2017-08-20 LAB — CULTURE, RESPIRATORY

## 2017-08-20 LAB — CBC WITH DIFFERENTIAL/PLATELET
Abs Immature Granulocytes: 0.2 10*3/uL — ABNORMAL HIGH (ref 0.0–0.1)
BASOS ABS: 0 10*3/uL (ref 0.0–0.1)
BASOS PCT: 0 %
EOS PCT: 0 %
Eosinophils Absolute: 0 10*3/uL (ref 0.0–0.7)
HCT: 27.4 % — ABNORMAL LOW (ref 36.0–46.0)
HEMOGLOBIN: 9 g/dL — AB (ref 12.0–15.0)
Immature Granulocytes: 1 %
Lymphocytes Relative: 4 %
Lymphs Abs: 0.6 10*3/uL — ABNORMAL LOW (ref 0.7–4.0)
MCH: 26.5 pg (ref 26.0–34.0)
MCHC: 32.8 g/dL (ref 30.0–36.0)
MCV: 80.8 fL (ref 78.0–100.0)
MONO ABS: 0.9 10*3/uL (ref 0.1–1.0)
MONOS PCT: 6 %
Neutro Abs: 13.9 10*3/uL — ABNORMAL HIGH (ref 1.7–7.7)
Neutrophils Relative %: 89 %
PLATELETS: 212 10*3/uL (ref 150–400)
RBC: 3.39 MIL/uL — ABNORMAL LOW (ref 3.87–5.11)
RDW: 18.5 % — ABNORMAL HIGH (ref 11.5–15.5)
WBC: 15.6 10*3/uL — ABNORMAL HIGH (ref 4.0–10.5)

## 2017-08-20 LAB — CULTURE, RESPIRATORY W GRAM STAIN
Culture: NORMAL
Culture: NORMAL

## 2017-08-20 LAB — BASIC METABOLIC PANEL
Anion gap: 9 (ref 5–15)
BUN: 15 mg/dL (ref 8–23)
CHLORIDE: 116 mmol/L — AB (ref 98–111)
CO2: 20 mmol/L — ABNORMAL LOW (ref 22–32)
CREATININE: 0.38 mg/dL — AB (ref 0.44–1.00)
Calcium: 7.9 mg/dL — ABNORMAL LOW (ref 8.9–10.3)
GFR calc Af Amer: >60 mL/min (ref >60)
GFR calc non Af Amer: >60 mL/min (ref >60)
GLUCOSE: 136 mg/dL — AB (ref 70–99)
Potassium: 3.3 mmol/L — ABNORMAL LOW (ref 3.5–5.1)
Sodium: 145 mmol/L (ref 135–145)

## 2017-08-20 LAB — MAGNESIUM: MAGNESIUM: 1.8 mg/dL (ref 1.7–2.4)

## 2017-08-20 LAB — GLUCOSE, CAPILLARY
GLUCOSE-CAPILLARY: 89 mg/dL (ref 70–99)
GLUCOSE-CAPILLARY: 111 mg/dL — AB (ref 70–99)
Glucose-Capillary: 118 mg/dL — ABNORMAL HIGH (ref 70–99)
Glucose-Capillary: 114 mg/dL — ABNORMAL HIGH (ref 70–99)
Glucose-Capillary: 91 mg/dL (ref 70–99)

## 2017-08-20 LAB — PHOSPHORUS: PHOSPHORUS: 1.3 mg/dL — AB (ref 2.5–4.6)

## 2017-08-20 MED ORDER — MYCOPHENOLATE MOFETIL 250 MG PO CAPS
1000.0000 mg | ORAL_CAPSULE | Freq: Two times a day (BID) | ORAL | Status: DC
  Filled 2017-08-20 (×4): qty 4

## 2017-08-20 MED ORDER — HYDRALAZINE HCL 20 MG/ML IJ SOLN
10.0000 mg | INTRAMUSCULAR | Status: DC | PRN
  Administered 2017-08-20: 40 mg via INTRAVENOUS
  Administered 2017-08-20 – 2017-08-23 (×7): 20 mg via INTRAVENOUS
  Filled 2017-08-20: qty 1
  Filled 2017-08-20 (×2): qty 2
  Filled 2017-08-20 (×2): qty 1
  Filled 2017-08-20: qty 2
  Filled 2017-08-20 (×2): qty 1

## 2017-08-20 MED ORDER — OXYCODONE HCL 5 MG PO TABS
5.0000 mg | ORAL_TABLET | Freq: Once | ORAL | Status: AC
  Administered 2017-08-20: 5 mg
  Filled 2017-08-20: qty 1

## 2017-08-20 MED ORDER — MYCOPHENOLATE 200 MG/ML ORAL SUSPENSION
1000.0000 mg | Freq: Two times a day (BID) | ORAL | Status: DC
  Filled 2017-08-20 (×2): qty 20

## 2017-08-20 MED ORDER — ORAL CARE MOUTH RINSE
15.0000 mL | Freq: Two times a day (BID) | OROMUCOSAL | Status: DC
  Administered 2017-08-20 – 2017-09-09 (×35): 15 mL via OROMUCOSAL

## 2017-08-20 MED ORDER — LABETALOL HCL 5 MG/ML IV SOLN
10.0000 mg | INTRAVENOUS | Status: DC | PRN
  Administered 2017-08-20: 10 mg via INTRAVENOUS
  Administered 2017-08-20 – 2017-08-21 (×4): 20 mg via INTRAVENOUS
  Filled 2017-08-20 (×7): qty 4

## 2017-08-20 MED ORDER — FUROSEMIDE 10 MG/ML IJ SOLN
40.0000 mg | Freq: Once | INTRAMUSCULAR | Status: AC
  Administered 2017-08-20: 40 mg via INTRAVENOUS
  Filled 2017-08-20: qty 4

## 2017-08-20 MED ORDER — POTASSIUM CHLORIDE 20 MEQ/15ML (10%) PO SOLN
20.0000 meq | ORAL | Status: AC
  Administered 2017-08-20 (×2): 20 meq
  Filled 2017-08-20 (×2): qty 15

## 2017-08-20 MED ORDER — SODIUM CHLORIDE 0.9 % IV SOLN
INTRAVENOUS | Status: DC
  Administered 2017-08-20: 11:00:00 via INTRAVENOUS

## 2017-08-20 MED ORDER — CHLORHEXIDINE GLUCONATE 0.12 % MT SOLN
15.0000 mL | Freq: Two times a day (BID) | OROMUCOSAL | Status: DC
  Administered 2017-08-20 – 2017-09-09 (×39): 15 mL via OROMUCOSAL
  Filled 2017-08-20 (×33): qty 15

## 2017-08-20 MED ORDER — SODIUM PHOSPHATES 45 MMOLE/15ML IV SOLN
30.0000 mmol | Freq: Once | INTRAVENOUS | Status: AC
  Administered 2017-08-20: 30 mmol via INTRAVENOUS
  Filled 2017-08-20: qty 10

## 2017-08-20 MED ORDER — ATENOLOL 50 MG PO TABS
50.0000 mg | ORAL_TABLET | Freq: Every day | ORAL | Status: DC
  Administered 2017-08-20: 50 mg via ORAL
  Filled 2017-08-20 (×2): qty 1

## 2017-08-20 NOTE — Procedures (Signed)
Extubation Procedure Note  Patient Details:   Name: [name] [name] DOB: Aug 24, 1940 MRN: [id number]   Airway Documentation:    Vent end date: 08/20/17 Vent end time: 1115   Evaluation  O2 sats: stable throughout Complications: No apparent complications Patient did tolerate procedure well. Bilateral Breath Sounds: Diminished, Rhonchi, Fine crackles   Yes, patient able to speak. Extubated to [facility] with RN at bedside with no complications. Will continue to monitor.   Herbie Baltimore 08/20/2017, 11:16 AM

## 2017-08-20 NOTE — Progress Notes (Signed)
PULMONARY / CRITICAL CARE MEDICINE   Name: [name] [name] MRN: [id number] DOB: April 24, 1940    ADMISSION DATE:  08/17/2017 CONSULTATION DATE:  8/5  REFERRING MD:  Dr. Lorin Mercy  CHIEF COMPLAINT:  Confusion and blood per rectum with hypotension  HISTORY OF PRESENT ILLNESS:   76yoF with hx Polymyositis (on Prednisone 20mg  BID and Cellcept chronically), CVA (on ASA and Plavix), Gastric leiomyoma, HTN, GERD, and dCHF, who presented to ER this AM with BRBPR that began acutely this AM, episodes of hypotension and unresponsiveness in the ER, required intubation, which was difficult,  and received 2u pRBC per RN.  EGD performed by GI which showed no signs of bleeding and no further bleeding episodes since admit.  CXR post intubation noted for left sided pneumothorax.    STUDIES:  CT head 8/5 >>  1. Motion degraded study without definite acute finding. 2. A small left cerebral white matter infarct was likely present on 06/09/2017 brain MRI. 3. Severe chronic small vessel ischemia.  CTA head/ neck 8/5 >> 1. No emergent large vessel occlusion or other acute finding. Stable from 06/09/2017. 2. Extensive atherosclerotic irregularity and narrowing of intracranial medium size vessels. 3. No significant atherosclerosis or stenosis in the neck.  8/5 EGD >> no active UGIB 8/5 CTA abd/ pelvis >> No evidence of gastrointestinal bleeding.  Severe narrowing just beyond the origin of the celiac axis.  Consolidation at the left lung base.  Large left pneumothorax.  Tiny right pneumothorax.  Extensive pneumomediastinum.  Postoperative changes in the lumbar spine. Stable L4 compression deformity.  CULTURES: 8/5 MRSA PCR >> neg 8/5 BC x 2 >>ngtd 8/5 Trach asp  >>  8/6 Left pleural fluid culture >>ng                           ANTIBIOTICS: 8/5 Zosyn >>  SIGNIFICANT EVENTS: 8/5 Admit, intubated, EGD  LINES/TUBES: 8/5 ETT >> 8/5 foley >> 85 2019 left chest tube pigtail>> R chest Port >>  SUBJECTIVE:   Critically ill, intuabted Afebrile No more bleeding noted last 24h  VITAL SIGNS: BP (!) 169/84   Pulse (!) 110   Temp 98.1 F (36.7 C) (Oral)   Resp 20   Ht 5\' 2"  (1.575 m)   Wt 70.3 kg   SpO2 100%   BMI 28.35 kg/m   HEMODYNAMICS:    VENTILATOR SETTINGS: Vent Mode: PRVC FiO2 (%):  [30 %] 30 % Set Rate:  [14 bmp] 14 bmp Vt Set:  [450 mL] 450 mL PEEP:  [5 cmH20] 5 cmH20 Pressure Support:  [15 cmH20] 15 cmH20 Plateau Pressure:  [10 cmH20-21 cmH20] 18 cmH20  INTAKE / OUTPUT: I/O last 3 completed shifts: In: 5789.7 [I.V.:3239.8; NG/GT:2300; IV Piggyback:249.9] Out: 1904 [Urine:1890; Chest Tube:14]  PHYSICAL EXAMINATION: Gen. Elderly, frail,, in no distress, anxious affect ENT - oral ETT , pallor +, no icterus Neck: No JVD, no thyromegaly, no carotid bruits Lungs: no use of accessory muscles, no dullness to percussion, decreased without rales or rhonchi , no air leak on chest tube Cardiovascular: Rhythm regular, heart sounds  normal, no murmurs or gallops, no peripheral edema Abdomen: soft and non-tender, no hepatosplenomegaly, BS normal. Musculoskeletal: No deformities, no cyanosis or clubbing Neuro: awake,follows commands     LABS:  BMET Recent Labs  Lab 08/18/17 0416 08/19/17 0500 08/20/17 0428  NA 144 145 145  K 3.6 2.7* 3.3*  CL 113* 116* 116*  CO2 20* 20* 20*  BUN 20 19  15  CREATININE 0.63 0.49 0.38*  GLUCOSE 128* 166* 136*    Electrolytes Recent Labs  Lab 08/18/17 0416  08/18/17 1712 08/19/17 0500 08/20/17 0428  CALCIUM 7.9*  --   --  7.6* 7.9*  MG  --    < > 1.9 2.0 1.8  PHOS  --    < > 2.7 2.3* 1.3*   < > = values in this interval not displayed.    CBC Recent Labs  Lab 08/18/17 2000 08/19/17 0500 08/20/17 0428  WBC 16.6* 13.1* 15.6*  HGB 9.1* 8.5* 9.0*  HCT 27.9* 25.5* 27.4*  PLT 222 200 212    Coag's Recent Labs  Lab 08/17/17 0638 08/18/17 0416  INR 0.94 1.13    Sepsis Markers Recent Labs  Lab 08/17/17 2030  08/18/17 0113 08/18/17 0530 08/18/17 1042  LATICACIDVEN 2.8* 3.0* 2.1* 2.1*  PROCALCITON 0.27  --   --   --     ABG Recent Labs  Lab 08/17/17 1514 08/19/17 0325  PHART 7.343* 7.327*  PCO2ART 38.8 35.6  PO2ART 211.0* 154.0*    Liver Enzymes Recent Labs  Lab 08/17/17 0638  AST 16  ALT 18  ALKPHOS 43  BILITOT 0.8  ALBUMIN 3.2*    Cardiac Enzymes No results for input(s): TROPONINI, PROBNP in the last 168 hours.  Glucose Recent Labs  Lab 08/19/17 1141 08/19/17 1531 08/19/17 1919 08/19/17 2325 08/20/17 0342 08/20/17 0746  GLUCAP 111* 85 104* 138* 118* 89    Imaging    ASSESSMENT / PLAN:  Shock- now resolved.  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 and now wheelchair and conversant at baseline - head CT, CTA of head and neck without acute changes 8/5 - UA negative P:  Dc sedation Frequent neurological checks Plavix on hold due to GI bleeding    Suspected GIB with bright blood per rectum, on plavix and ASA ABLA  Probable malnutrition with low albumin and protein levels - Hx of oozing ulcerated cecal AVM years ago - EGD 8/5 showed no active UGI bleed - transfused 2 u PRBC and 1pk Plts, with Hgb trend 10.5 -> 10.5 - CTA abd/ pelvis show no acute bleeding P:  Trend CBC Transfuse per protocol, goal Hb 8  Continue tube feeding GI following   Respiratory Insufficiency - difficult intubation, intubated due to confusion, hypotension, and emergent need for EGD Left Pneumothorax s/p pigtail CT 8/5, Small right pneumothorax noted on CT, small  pneumomediastinum and pneumopericardium Left lower lobe consolidation noted on CT Leukocytosis - non specific given chronic steroids  P:   Vent bundle SBTs with goal extubation Ct chest tube to waterseal    Adrenal Insufficiency  Polymyositis  Recent Labs  Lab 08/18/17 0416 08/19/17 0500 08/20/17 0428  NA 144 145 145    - - on chronic prednisone for polymyositis - recent flare on immunosuppressant meds P: Restart cellcept once extubated Continue current stress dose steroids  Hypokalemia / hypophos -replete   Hopeful to extubate today    The patient is critically ill with multiple organ systems failure and requires high complexity decision making for assessment and support, frequent evaluation and titration  of therapies, application of advanced monitoring technologies and extensive interpretation of multiple databases. Critical Care Time devoted to patient care services described in this note independent of APP/resident  time is 32 minutes.   Kara Mead MD. Shade Flood. [facility] Pager [phone] If no response call [phone]     08/20/2017, 9:09 AM

## 2017-08-20 NOTE — Progress Notes (Addendum)
[facility] Progress Note   Chief Complaint:   GI bleed    SUBJECTIVE:    family visiting.    ASSESSMENT AND PLAN:    77 yo female with acute GI bleed / shock on Plavix and asa. Resolved  DARK bloody stool ( on iron ), so EGD done to rule out UGI bleed but it was negative.  CTA abd/pelvis following EGD was unrevealing. Bleed may have been a diverticular hemorrhage. Recurrent colonic AVM bleed another possibility  Plavix is still on hold -Hgb had drifted slightly yesterday but back up to around 9 today. No blood transfusion since the one unit she got two days ago. Just a few smears of maroon blood yesterday and again today.  -No need for further endoscopic workup right now. She is extubated.    Attending physician's note   I have taken an interval history, reviewed the chart and examined the patient. I agree with the Advanced Practitioner's note, impression and recommendations.  Discussed with the patient's family.  No further GI bleeding.  EGD was negative. CTA negative for bleeding.  Patient likely had a diverticular bleed.  No endoscopic work-up planned during this admission.  Can resume Plavix near discharge.  Trend CBC.   Carmell Austria, MD   OBJECTIVE:     Vital signs in last 24 hours: Temp:  [98 F (36.7 C)-99 F (37.2 C)] 98 F (36.7 C) (08/08 1134) Pulse Rate:  [75-110] 107 (08/08 1400) Resp:  [7-44] 44 (08/08 1400) BP: (107-190)/(64-113) 159/88 (08/08 1400) SpO2:  [98 %-100 %] 100 % (08/08 1400) Arterial Line BP: (150-240)/(52-176) 165/66 (08/08 1400) FiO2 (%):  [30 %] 30 % (08/08 0950) Weight:  [70.3 kg] 70.3 kg (08/08 0300) Last BM Date: 08/17/17 General:   Alert, well-developed,  female  in NAD Heart:  Regular rate and rhythm; no murmurs. No  lower extremity edema Pulm: extubated. Normal respiratory effort Abdomen:  Soft, nondistended, nontender.  Normal bowel sounds, no masses felt.     Neurologic:  Alert , makes eye contact. Seems to be  interacting with family. Doesn't answer my questions.   Psych:  cooperative.  Following commands  Intake/Output from previous day: 08/07 0701 - 08/08 0700 In: 3623.5 [I.V.:1673.7; NG/GT:1750; IV Piggyback:199.9] Out: 1575 [Urine:1565; Chest Tube:10] Intake/Output this shift: Total I/O In: 727 [I.V.:174.5; NG/GT:452.5; IV Piggyback:100] Out: 575 [Urine:575]  Lab Results: Recent Labs    08/18/17 2000 08/19/17 0500 08/20/17 0428  WBC 16.6* 13.1* 15.6*  HGB 9.1* 8.5* 9.0*  HCT 27.9* 25.5* 27.4*  PLT 222 200 212   BMET Recent Labs    08/18/17 0416 08/19/17 0500 08/20/17 0428  NA 144 145 145  K 3.6 2.7* 3.3*  CL 113* 116* 116*  CO2 20* 20* 20*  GLUCOSE 128* 166* 136*  BUN 20 19 15   CREATININE 0.63 0.49 0.38*  CALCIUM 7.9* 7.6* 7.9*    PT/INR Recent Labs    08/18/17 0416  LABPROT 14.5  INR 1.13   Hepatitis Panel No results for input(s): HEPBSAG, HCVAB, HEPAIGM, HEPBIGM in the last 72 hours.  Ct Head Wo Contrast  Result Date: 08/19/2017 CLINICAL DATA:  Altered mental status.  Cephalopathy. EXAM: CT HEAD WITHOUT CONTRAST TECHNIQUE: Contiguous axial images were obtained from the base of the skull through the vertex without intravenous contrast. COMPARISON:  08/17/2017. FINDINGS: Brain: Diffusely enlarged ventricles and subarachnoid spaces. Patchy white matter low density in both cerebral hemispheres. Streak artifacts from a high density lead attached to the right  frontal bone. No intracranial hemorrhage, mass lesion or CT evidence of acute infarction. Vascular: No hyperdense vessel or unexpected calcification. Skull: Normal. Negative for fracture or focal lesion. Sinuses/Orbits: Unremarkable. Other: None. IMPRESSION: 1. No acute abnormality. 2. Stable mild diffuse cerebral and cerebellar atrophy. 3. Stable marked chronic small vessel white matter ischemic changes in both cerebral hemispheres. Electronically Signed   By: Claudie Revering M.D.   On: 08/19/2017 13:50   Dg Chest  Port 1 View  Result Date: 08/20/2017 CLINICAL DATA:  Respiratory failure. EXAM: PORTABLE CHEST 1 VIEW COMPARISON:  08/19/2017 FINDINGS: Endotracheal tube remains present with the tip approximately 3 cm above the carina. Stable positioning pigtail chest tube on the left and right-sided Port-A-Cath. Gastric decompression tube extends below the diaphragm. Lungs show slight increase in right basilar atelectasis. The left lower lobe shows improved aeration. No overt edema, evidence of pneumothorax or pleural fluid. IMPRESSION: Improved aeration of the left lower lung. Slight increase in right basilar atelectasis. Electronically Signed   By: Aletta Edouard M.D.   On: 08/20/2017 11:08   Dg Chest Port 1 View  Result Date: 08/19/2017 CLINICAL DATA:  Endotracheal tube is assessment EXAM: PORTABLE CHEST 1 VIEW COMPARISON:  August 18, 2017 FINDINGS: The heart size and mediastinal contours are stable. Endotracheal tube is identified with distal tip 4.5 cm from carina in good position. A nasogastric tube is identified with distal tip not included on film but is at least in the stomach. A right central venous line is identified with distal tip in the superior vena cava, unchanged. Left chest tube is unchanged. There is pulmonary vascular congestion. Probable left pleural effusion is identified. Consolidation of left lung base is unchanged. The visualized skeletal structures are stable. IMPRESSION: Endotracheal tube is identified with distal tip 4.5 cm from carina, in good position. Pulmonary vascular congestion of the lungs. There is probable left pleural effusion with consolidation of left lung base unchanged compared prior exam. Electronically Signed   By: Abelardo Diesel M.D.   On: 08/19/2017 09:32    Active Problems:   Polymyositis (HCC)   Adrenal insufficiency (HCC)   Acute blood loss anemia   CVA (cerebral vascular accident) ([place])   Acute upper GI bleed   Nontraumatic hemorrhagic shock ([place])     LOS: 3 days    [name] [name] ,NP 08/20/2017, 3:47 PM

## 2017-08-20 NOTE — Progress Notes (Signed)
Called to bedside in reference to concern of possible new right crepitus. Patient extubated today and left pigtail chest tube has been on waterseal.  No hypoxia or respiratory distress, and patient has been hypertensive requiring IV prn labetalol and apresoline since OGT was removed with extubation and patient unable to take PO's.  Apparently family did not want feeding tube replaced earlier today.    Stat CXR performed with stable changes, no pneumothorax noted, small bilateral effusions and increased interstitial edema noted, therefore, IVF d/c as patient is net +9L and lasix 40mg  IV once.      Kennieth Rad, AGACNP-BC [facility] Pgr: [phone] or if no answer [phone] 08/20/2017, 11:04 PM

## 2017-08-20 NOTE — Progress Notes (Signed)
[facility] ADULT ICU REPLACEMENT PROTOCOL FOR AM LAB REPLACEMENT ONLY  The patient does apply for the [facility] Adult ICU Electrolyte Replacment Protocol based on the criteria listed below:   1. Is GFR >/= 40 ml/min? Yes.    Patient's GFR today is >60 2. Is urine output >/= 0.5 ml/kg/hr for the last 6 hours? Yes.   Patient's UOP is 1.5 ml/kg/hr 3. Is BUN < 60 mg/dL? Yes.    Patient's BUN today is 15 4. Abnormal electrolyte(s): K-3.3 5. Ordered repletion with: per protocol 6. If a panic level lab has been reported, has the CCM MD in charge been notified? Yes.  .   Physician:  Dr. Jonetta Speak, Philis Nettle 08/20/2017 6:04 AM

## 2017-08-21 ENCOUNTER — Inpatient Hospital Stay (HOSPITAL_COMMUNITY): Payer: Medicare Other

## 2017-08-21 LAB — BASIC METABOLIC PANEL
ANION GAP: 11 (ref 5–15)
Anion gap: 10 (ref 5–15)
BUN: 9 mg/dL (ref 8–23)
BUN: 10 mg/dL (ref 8–23)
CALCIUM: 8.8 mg/dL — AB (ref 8.9–10.3)
CHLORIDE: 107 mmol/L (ref 98–111)
CO2: 26 mmol/L (ref 22–32)
CO2: 28 mmol/L (ref 22–32)
CREATININE: 0.45 mg/dL (ref 0.44–1.00)
Calcium: 8.5 mg/dL — ABNORMAL LOW (ref 8.9–10.3)
Chloride: 103 mmol/L (ref 98–111)
Creatinine, Ser: 0.34 mg/dL — ABNORMAL LOW (ref 0.44–1.00)
GFR calc Af Amer: >60 mL/min (ref >60)
GFR calc Af Amer: >60 mL/min (ref >60)
GFR calc non Af Amer: >60 mL/min (ref >60)
GFR calc non Af Amer: >60 mL/min (ref >60)
Glucose, Bld: 79 mg/dL (ref 70–99)
Glucose, Bld: 103 mg/dL — ABNORMAL HIGH (ref 70–99)
POTASSIUM: 3.3 mmol/L — AB (ref 3.5–5.1)
Potassium: 4 mmol/L (ref 3.5–5.1)
SODIUM: 142 mmol/L (ref 135–145)
Sodium: 143 mmol/L (ref 135–145)

## 2017-08-21 LAB — CBC
HEMATOCRIT: 30.7 % — AB (ref 36.0–46.0)
Hemoglobin: 10.1 g/dL — ABNORMAL LOW (ref 12.0–15.0)
MCH: 26.4 pg (ref 26.0–34.0)
MCHC: 32.9 g/dL (ref 30.0–36.0)
MCV: 80.4 fL (ref 78.0–100.0)
Platelets: 202 10*3/uL (ref 150–400)
RBC: 3.82 MIL/uL — ABNORMAL LOW (ref 3.87–5.11)
RDW: 18.2 % — AB (ref 11.5–15.5)
WBC: 14.5 10*3/uL — AB (ref 4.0–10.5)

## 2017-08-21 LAB — BODY FLUID CULTURE: CULTURE: NO GROWTH

## 2017-08-21 LAB — GLUCOSE, CAPILLARY
GLUCOSE-CAPILLARY: 71 mg/dL (ref 70–99)
GLUCOSE-CAPILLARY: 72 mg/dL (ref 70–99)
Glucose-Capillary: 108 mg/dL — ABNORMAL HIGH (ref 70–99)
Glucose-Capillary: 76 mg/dL (ref 70–99)
Glucose-Capillary: 71 mg/dL (ref 70–99)
Glucose-Capillary: 88 mg/dL (ref 70–99)

## 2017-08-21 LAB — PHOSPHORUS: Phosphorus: 2.5 mg/dL (ref 2.5–4.6)

## 2017-08-21 LAB — MAGNESIUM: Magnesium: 1.7 mg/dL (ref 1.7–2.4)

## 2017-08-21 MED ORDER — METHYLPREDNISOLONE SODIUM SUCC 40 MG IJ SOLR
40.0000 mg | Freq: Every day | INTRAMUSCULAR | Status: AC
  Administered 2017-08-21 – 2017-08-25 (×5): 40 mg via INTRAVENOUS
  Filled 2017-08-21 (×5): qty 1

## 2017-08-21 MED ORDER — POTASSIUM CHLORIDE 10 MEQ/100ML IV SOLN
10.0000 meq | INTRAVENOUS | Status: AC
  Administered 2017-08-21 (×6): 10 meq via INTRAVENOUS
  Filled 2017-08-21 (×2): qty 100

## 2017-08-21 MED ORDER — POTASSIUM CHLORIDE 20 MEQ/15ML (10%) PO SOLN: 20.0000 meq | ORAL | Status: DC

## 2017-08-21 MED ORDER — POTASSIUM CHLORIDE 10 MEQ/50ML IV SOLN
10.0000 meq | INTRAVENOUS | Status: AC
  Administered 2017-08-21 (×2): 10 meq via INTRAVENOUS
  Filled 2017-08-21 (×3): qty 50

## 2017-08-21 MED ORDER — FUROSEMIDE 10 MG/ML IJ SOLN
40.0000 mg | Freq: Two times a day (BID) | INTRAMUSCULAR | Status: AC
  Administered 2017-08-21 (×2): 40 mg via INTRAVENOUS
  Filled 2017-08-21 (×3): qty 4

## 2017-08-21 MED ORDER — WHITE PETROLATUM EX OINT
TOPICAL_OINTMENT | CUTANEOUS | Status: AC
  Administered 2017-08-21: 0.2
  Filled 2017-08-21: qty 28.35

## 2017-08-21 NOTE — Plan of Care (Signed)

## 2017-08-21 NOTE — Progress Notes (Signed)
[facility] ADULT ICU REPLACEMENT PROTOCOL FOR AM LAB REPLACEMENT ONLY  The patient does apply for the [facility] Adult ICU Electrolyte Replacment Protocol based on the criteria listed below:   1. Is GFR >/= 40 ml/min? Yes.    Patient's GFR today is >60 2. Is urine output >/= 0.5 ml/kg/hr for the last 6 hours? Yes.   Patient's UOP is 2 ml/kg/hr 3. Is BUN < 60 mg/dL? Yes.    Patient's BUN today is 9 4. Abnormal electrolyte(s): K- 3.3 5. Ordered repletion with: per protocol 6. If a panic level lab has been reported, has the CCM MD in charge been notified? Yes.  .   Physician:  Dr. Arminda Resides, Philis Nettle 08/21/2017 5:47 AM

## 2017-08-21 NOTE — Progress Notes (Signed)
PCCM Interval Progress Note  CXR reviewed, no PTX.  Will discontinue chest tube.  Does have edema and effusions.  Recommend continuing diuresis as BP and SCr permit. Consider thoracentesis (either IR or PCCM; though IR might be more appropriate given pt's hx of wheelchair bound, etc).  Nothing further to add.  PCCM will sign off.  Please do not hesitate to call us back if we can be of any further assistance.    Montey Hora, [facility] Pager: [phone]  or [phone] 08/21/2017, 9:55 AM

## 2017-08-21 NOTE — Progress Notes (Addendum)
PULMONARY / CRITICAL CARE MEDICINE   Name: [name] [name] MRN: [id number] DOB: Jan 12, 1941    ADMISSION DATE:  08/17/2017 CONSULTATION DATE:  8/5  REFERRING MD:  Dr. Lorin Mercy  CHIEF COMPLAINT:  Confusion and blood per rectum with hypotension  HISTORY OF PRESENT ILLNESS:   76yoF with hx Polymyositis (on Prednisone 20mg  BID and Cellcept chronically), CVA (on ASA and Plavix), Gastric leiomyoma, HTN, GERD, and dCHF, who presented to ER this AM with BRBPR that began acutely this AM, episodes of hypotension and unresponsiveness in the ER, required intubation, which was difficult,  and received 2u pRBC per RN.  EGD performed by GI which showed no signs of bleeding and no further bleeding episodes since admit.  CXR post intubation noted for left sided pneumothorax.    STUDIES:  CT head 8/5 >>  1. Motion degraded study without definite acute finding. 2. A small left cerebral white matter infarct was likely present on 06/09/2017 brain MRI. 3. Severe chronic small vessel ischemia.  CTA head/ neck 8/5 >> 1. No emergent large vessel occlusion or other acute finding. Stable from 06/09/2017. 2. Extensive atherosclerotic irregularity and narrowing of intracranial medium size vessels. 3. No significant atherosclerosis or stenosis in the neck.  8/5 EGD >> no active UGIB 8/5 CTA abd/ pelvis >> No evidence of gastrointestinal bleeding.  Severe narrowing just beyond the origin of the celiac axis.  Consolidation at the left lung base.  Large left pneumothorax.  Tiny right pneumothorax.  Extensive pneumomediastinum.  Postoperative changes in the lumbar spine. Stable L4 compression deformity.  CULTURES: 8/5 MRSA PCR >> neg 8/5 BC x 2 >>ngtd 8/5 Trach asp  >>  8/6 Left pleural fluid culture >>ng                          ANTIBIOTICS: Zosyn 8/6 > 8/7 Unasyn 8/6 >   SIGNIFICANT EVENTS: 8/5 Admit, intubated, EGD, left pigtail chest tube placed for PTX 8/8 extubated  LINES/TUBES: 8/5 ETT >> 8/8 8/5  foley >> 8/5 2019 left chest tube pigtail>> R chest Port >>  SUBJECTIVE:  Extubated yesterday 8/8 and tolerated OK.  Attempted swallow eval this AM but too weak, not yet ready. Some concern for right chest crepitus overnight. CXR obtained and normal. Chest tube to water seal 8/7 and currently no air leak. Awaiting CXR this AM.   VITAL SIGNS: BP (!) 122/107   Pulse 76   Temp 98.1 F (36.7 C) (Axillary)   Resp 20   Ht 5\' 2"  (1.575 m)   Wt 69.8 kg   SpO2 100%   BMI 28.15 kg/m   HEMODYNAMICS:    VENTILATOR SETTINGS: FiO2 (%):  [30 %] 30 % PEEP:  [5 cmH20] 5 cmH20 Pressure Support:  [5 cmH20] 5 cmH20  INTAKE / OUTPUT: I/O last 3 completed shifts: In: 4096.5 [I.V.:2129.6; NG/GT:1452.5; IV Piggyback:514.4] Out: 5102 [Urine:4650; Chest Tube:18]  PHYSICAL EXAMINATION: Gen: Elderly, frail, in no distress, chronically ill appearing ENT: [place] / AT. MMM Neck: No JVD, no thyromegaly, no carotid bruits Lungs: Normal effort.  Clear bilaterally.  Chest tube on left, no air leak Cardiovascular: RRR, no M/R/G Abdomen: soft and non-tender, no hepatosplenomegaly, BS normal Musculoskeletal: No deformities, no cyanosis or clubbing Neuro: awake,follows basic commands    LABS:  BMET Recent Labs  Lab 08/19/17 0500 08/20/17 0428 08/21/17 0436  NA 145 145 143  K 2.7* 3.3* 3.3*  CL 116* 116* 107  CO2 20* 20* 26  BUN  19 15 9   CREATININE 0.49 0.38* 0.34*  GLUCOSE 166* 136* 79    Electrolytes Recent Labs  Lab 08/19/17 0500 08/20/17 0428 08/21/17 0436  CALCIUM 7.6* 7.9* 8.5*  MG 2.0 1.8 1.7  PHOS 2.3* 1.3* 2.5    CBC Recent Labs  Lab 08/19/17 0500 08/20/17 0428 08/21/17 0436  WBC 13.1* 15.6* 14.5*  HGB 8.5* 9.0* 10.1*  HCT 25.5* 27.4* 30.7*  PLT 200 212 202    Coag's Recent Labs  Lab 08/17/17 0638 08/18/17 0416  INR 0.94 1.13    Sepsis Markers Recent Labs  Lab 08/17/17 2030 08/18/17 0113 08/18/17 0530 08/18/17 1042  LATICACIDVEN 2.8* 3.0* 2.1* 2.1*   PROCALCITON 0.27  --   --   --     ABG Recent Labs  Lab 08/17/17 1514 08/19/17 0325  PHART 7.343* 7.327*  PCO2ART 38.8 35.6  PO2ART 211.0* 154.0*    Liver Enzymes Recent Labs  Lab 08/17/17 0638  AST 16  ALT 18  ALKPHOS 43  BILITOT 0.8  ALBUMIN 3.2*    Cardiac Enzymes No results for input(s): TROPONINI, PROBNP in the last 168 hours.  Glucose Recent Labs  Lab 08/20/17 1133 08/20/17 1640 08/20/17 1932 08/20/17 2323 08/21/17 0302 08/21/17 0746  GLUCAP 111* 114* 108* 91 76 72     ASSESSMENT / PLAN:   Respiratory Insufficiency - difficult intubation, intubated due to confusion, hypotension, and emergent need for EGD. Extubated 8/8 and tolerated well Left Pneumothorax s/p pigtail CT 8/5, Small right pneumothorax noted on CT, small pneumomediastinum and pneumopericardium.  CT on water seal since 8/7. Acute pulmonary edema with small b/l effusions P:   Continue supplemental O2 as needed to maintain SpO2 > 92% Repeat CXR today, if no PTX then will d/c chest tube 40mg  lasix x 2 doses today, assess response and might need to continue (curently 8L positive)  Hx Polymyositis - on chronic prednisone for polymyositis.  Note of recent flare on immunosuppressant meds P: Restart cellcept once able to take PO (if not by 8/10 then will likely need cortrak placement for PO meds) Pred changed to solumedrol today until can take PO's or per tube  Shock - now resolved.  Initially suspected hemorrhagic given reported blood loss/ rectal bleeding however Hgb trend seems not to support this; possibly some component could have been related to pneumothorax; vs component of sepsis given immunosuppression, unclear at this point Hx HTN, diastolic HF P:  Tele monitoring Continue antihypertensives (Labetalol , hydrallazine prn, atenolol once taking PO)  Acute encephalopathy - thought related to hypoperfusion, improved and currently following basic commands.  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 now wheelchair and conversant at baseline P:  Dc sedation Frequent neurological checks Plavix on hold due to GI bleeding  Suspected GIB with bright blood per rectum, on plavix and ASA - EGD 8/5 showed no active UGI bleed, CTA abd/ pelvis show no acute bleeding ABLA - transfused 2 u PRBC and 1pk Plts, with Hgb trend 10.5 -> 10.5 Probable malnutrition with low albumin and protein levels P:  Trend CBC Transfuse per protocol, goal Hb 8  NPO for now as failed swallow today 8/9, will attempt again 8/10 per speech If fails swallow 8/10, will likely need cortrak placement GI not recommending anything further   Will follow up on CXR this AM. If no PTX then will d/c chest tube and PCCM will sign off at that time.  Montey Hora, [place] Pager: [phone]  or [phone] 08/21/2017, 9:43 AM

## 2017-08-21 NOTE — Progress Notes (Signed)
PROGRESS NOTE    [name] [name]  BWG:[id number] DOB: 1940/05/10 DOA: 08/17/2017 PCP: [name], MD   Brief Narrative: Patient is a 77 year old female with history of polymyositis, CVA, gastric leiomyoma, hypertension, GERD, diastolic CHF was brought to the emergency department with complaints of bright red blood per rectum.  Patient is from a facility . She had episodes of hypotension and unresponsiveness in the emergency department and she required intubation .GI was also consulted.  She underwent 2 unit of PRBC transfusion.  Chest x-ray post intubation showed left pneumothorax.  She was extubated on 8/8. Patient transferred to hospitalist service today.   Assessment & Plan:   Active Problems:   Polymyositis (HCC)   Adrenal insufficiency (HCC)   Acute blood loss anemia   CVA (cerebral vascular accident) ([place])   Acute upper GI bleed   Nontraumatic hemorrhagic shock ([place])   GI bleed/hematochezia/suspected hemorrhagic shock: Status post EGD. currently H&H stable.  Status post transfusion.  GI following.  Aspirin, Plavix on hold for GI bleed.  EGD on 8/5 showed no active upper GI bleed.  CT abdomen/pelvis did not show any acute bleed.  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  GI not planning for further endoscopic work-up.  Acute respiratory failure: She had to be intubated for airway protection.  Extubated on 8/8.  PCCM was following.   CT also showed left lower lobe consolidation.  Pulmonary edema/Bilateral pleural effusion/history of diastolic heart failure: PCCM he started on Lasix 40 mg IV twice daily.  She is also on Lasix at home.  Acute encephalopathy: Thought to be secondary to hypoperfusion, aspiration pneumonia.  She has history of a stroke in the past.  CT imagings were negative for acute intracranial abnormalities.  Hypertension: Continue current medications.  Continue to monitor blood pressure.  Aspiration pneumonia: On Unasyn.  Cultures negative so far.   She has mild white cell count elevation.  But she is also chronically on steroids. Speech is following and recommending n.p.o. for now.  We hope that she will be able to eat soon and  we can start on diet.  Left-sided pneumothorax: Resolved.  Chest tube on waterseal, does not have air leak.  PCCM planning for discontinue the chest tube.  Syncope: Most likely vasovagal secondary to hemorrhagic shock.  2 episodes of unresponsiveness in the emergency department.  Patient was admitted under ICU service.  She was intubated.  Currently extubated.  Polymyositis with history of chronic adrenal insufficiency: On CellCept and prednisone. Follows with rheumatologist with Dr. Amil Amen. Currently patient is n.p.o.  She has a port  on her right chest for blood draws because she is hard stick.  History history of CVA: On aspirin and Plavix.  Both on hold for now.  Hypertension: On atenolol at home.  Currently on hold .  Will resume on discharge.  Hyperlipidemia: On Lovaza at home.  Hypokalemia: Supplemented.  Debility/deconditioning: Due to CVA, Polymyositis. Bed bound since last several years.Has episodes of confusion.Has contractures on bilateral lower extremities. PT following.  Recommending skilled nursing facility on discharge.  Social worker following.  DVT prophylaxis: SCD Code Status: DNR Family Communication: Discussed extensively with daughter on phone Disposition Plan: Skilled nursing facility.  Patient just extubated yesterday.  Needs extensive care.  She is still n.p.o. .Not ready for discharge for coming few more days.   Consultants: PCCM,GI  Procedures: EGD, intubation  Antimicrobials: Unasyn  Subjective: Patient seen and examined the bedside this morning.  Hemodynamically stable.  No new issues/events.  No  nausea, vomiting, new episodes of bleeding or fever.   Objective: Vitals:   08/21/17 1210 08/21/17 1300 08/21/17 1400 08/21/17 1500  BP: (!) 172/90 (!) 174/91 113/64 138/79    Pulse: 89 (!) 132 81 85  Resp: 19 (!) 32 18 (!) 23  Temp:      TempSrc:      SpO2: 100% 100% 100% 96%  Weight:      Height:        Intake/Output Summary (Last 24 hours) at 08/21/2017 1534 Last data filed at 08/21/2017 1320 Gross per 24 hour  Intake 1092.64 ml  Output 3238 ml  Net -2145.36 ml   Filed Weights   08/19/17 0500 08/20/17 0300 08/21/17 0313  Weight: 68.4 kg 70.3 kg 69.8 kg    Examination:  General exam: Appears chronically ill, debilitated HEENT:PERRL,Oral mucosa moist, Ear/Nose normal on gross exam Respiratory system: Bilateral decreased air entry in the bases, bilateral occasional rhonchi  cardiovascular system: S1 & S2 heard, RRR. No JVD, murmurs, rubs, gallops or clicks. No pedal edema. Gastrointestinal system: Abdomen is nondistended, soft and nontender. No organomegaly or masses felt. Normal bowel sounds heard. Central nervous system: Alert but not oriented.  Extremities: No edema, no clubbing ,no cyanosis, distal peripheral pulses palpable. Skin: No rashes, lesions or ulcers,no icterus ,no pallor MSK: Muscle wasting, contractures Psychiatry: Judgement and insight appear impaired   Data Reviewed: I have personally reviewed following labs and imaging studies  CBC: Recent Labs  Lab 08/18/17 1317 08/18/17 2000 08/19/17 0500 08/20/17 0428 08/21/17 0436  WBC 19.6* 16.6* 13.1* 15.6* 14.5*  NEUTROABS  --   --   --  13.9*  --   HGB 9.6* 9.1* 8.5* 9.0* 10.1*  HCT 28.7* 27.9* 25.5* 27.4* 30.7*  MCV 79.5 81.1 81.2 80.8 80.4  PLT 235 222 200 212 627   Basic Metabolic Panel: Recent Labs  Lab 08/17/17 0638 08/18/17 0416 08/18/17 1129 08/18/17 1712 08/19/17 0500 08/20/17 0428 08/21/17 0436  NA 140 144  --   --  145 145 143  K 4.2 3.6  --   --  2.7* 3.3* 3.3*  CL 107 113*  --   --  116* 116* 107  CO2 23 20*  --   --  20* 20* 26  GLUCOSE 83 128*  --   --  166* 136* 79  BUN 33* 20  --   --  19 15 9   CREATININE 0.62 0.63  --   --  0.49 0.38* 0.34*   CALCIUM 9.0 7.9*  --   --  7.6* 7.9* 8.5*  MG  --   --  1.8 1.9 2.0 1.8 1.7  PHOS  --   --  3.0 2.7 2.3* 1.3* 2.5   GFR: Estimated Creatinine Clearance: 53.9 mL/min (A) (by C-G formula based on SCr of 0.34 mg/dL (L)). Liver Function Tests: Recent Labs  Lab 08/17/17 0638  AST 16  ALT 18  ALKPHOS 43  BILITOT 0.8  PROT 5.7*  ALBUMIN 3.2*   No results for input(s): LIPASE, AMYLASE in the last 168 hours. No results for input(s): AMMONIA in the last 168 hours. Coagulation Profile: Recent Labs  Lab 08/17/17 0638 08/18/17 0416  INR 0.94 1.13   Cardiac Enzymes: No results for input(s): CKTOTAL, CKMB, CKMBINDEX, TROPONINI in the last 168 hours. BNP (last 3 results) No results for input(s): PROBNP in the last 8760 hours. HbA1C: No results for input(s): HGBA1C in the last 72 hours. CBG: Recent Labs  Lab 08/20/17 1932 08/20/17  2323 08/21/17 0302 08/21/17 0746 08/21/17 1128  GLUCAP 108* 91 76 72 71   Lipid Profile: No results for input(s): CHOL, HDL, LDLCALC, TRIG, CHOLHDL, LDLDIRECT in the last 72 hours. Thyroid Function Tests: No results for input(s): TSH, T4TOTAL, FREET4, T3FREE, THYROIDAB in the last 72 hours. Anemia Panel: No results for input(s): VITAMINB12, FOLATE, FERRITIN, TIBC, IRON, RETICCTPCT in the last 72 hours. Sepsis Labs: Recent Labs  Lab 08/17/17 2030 08/18/17 0113 08/18/17 0530 08/18/17 1042  PROCALCITON 0.27  --   --   --   LATICACIDVEN 2.8* 3.0* 2.1* 2.1*    Recent Results (from the past 240 hour(s))  MRSA PCR Screening     Status: None   Collection Time: 08/17/17  1:17 PM  Result Value Ref Range Status   MRSA by PCR NEGATIVE NEGATIVE Final    Comment:        The GeneXpert MRSA Assay (FDA approved for NASAL specimens only), is one component of a comprehensive MRSA colonization surveillance program. It is not intended to diagnose MRSA infection nor to guide or monitor treatment for MRSA infections. Performed at [facility],  [place] [address] [place], [place] [address]   Culture, respiratory (non-expectorated)     Status: None   Collection Time: 08/17/17  8:30 PM  Result Value Ref Range Status   Specimen Description TRACHEAL ASPIRATE  Final   Special Requests NONE  Final   Gram Stain   Final    ABUNDANT WBC PRESENT, PREDOMINANTLY PMN ABUNDANT GRAM POSITIVE COCCI RARE GRAM POSITIVE RODS    Culture   Final    Consistent with normal respiratory flora. Performed at [facility], [place] [address] [place], [place] [address]    Report Status 08/20/2017 FINAL  Final  Culture, blood (routine x 2)     Status: None (Preliminary result)   Collection Time: 08/17/17  9:33 PM  Result Value Ref Range Status   Specimen Description BLOOD RIGHT HAND  Final   Special Requests   Final    BOTTLES DRAWN AEROBIC ONLY Blood Culture results may not be optimal due to an inadequate volume of blood received in culture bottles   Culture   Final    NO GROWTH 4 DAYS Performed at [facility], [place] [address] [place], [place] [address]    Report Status PENDING  Incomplete  Body fluid culture     Status: None   Collection Time: 08/18/17 10:44 AM  Result Value Ref Range Status   Specimen Description FLUID PLEURAL LEFT  Final   Special Requests Immunocompromised  Final   Gram Stain   Final    ABUNDANT WBC PRESENT, PREDOMINANTLY PMN NO ORGANISMS SEEN    Culture   Final    NO GROWTH 3 DAYS Performed at [facility], [place] [address], [place], [place] [address]    Report Status 08/21/2017 FINAL  Final  Culture, respiratory     Status: None   Collection Time: 08/18/17 11:29 AM  Result Value Ref Range Status   Specimen Description TRACHEAL ASPIRATE  Final   Special Requests NONE  Final   Gram Stain   Final    FEW WBC PRESENT, PREDOMINANTLY PMN RARE GRAM POSITIVE COCCI    Culture   Final    Consistent with normal respiratory flora. Performed at [facility], [place] [address], [place], [place] [address]    Report  Status 08/20/2017 FINAL  Final  Radiology Studies: Dg Chest Port 1 View  Result Date: 08/21/2017 CLINICAL DATA:  History of pneumothorax.  Chest tube in place. EXAM: PORTABLE CHEST 1 VIEW COMPARISON:  August 20, 2016 FINDINGS: No pneumothorax. The left chest tube is stable. A layering effusion with underlying opacity in seen on the right. Probable edema. No nodules or masses. Stable cardiomediastinal silhouette. Stable right Port-A-Cath. IMPRESSION: 1. Left-sided chest tube.  No pneumothorax. 2. Layering effusions bilaterally, right greater than left underlying atelectasis. 3. Pulmonary edema. Electronically Signed   By: Dorise Bullion III M.D   On: 08/21/2017 09:49   Dg Chest Port 1 View  Result Date: 08/20/2017 CLINICAL DATA:  Assess for pneumothorax. EXAM: PORTABLE CHEST 1 VIEW COMPARISON:  Chest radiograph performed earlier today at 4:32 a.m. FINDINGS: A right-sided chest port is noted ending about the mid SVC. Small bilateral pleural effusions are noted. Increased interstitial markings raise concern for pulmonary edema, worsened from the prior study. No pneumothorax is seen. A left chest pigtail catheter is noted in essentially unchanged position. The cardiomediastinal silhouette is borderline normal in size. No acute osseous abnormalities are seen. IMPRESSION: 1. No pneumothorax seen. Left chest pigtail catheter noted in essentially unchanged position. 2. Small bilateral pleural effusions noted. Increased interstitial markings raise concern for pulmonary edema, worsened from the prior study. Electronically Signed   By: Garald Balding M.D.   On: 08/20/2017 22:50   Dg Chest Port 1 View  Result Date: 08/20/2017 CLINICAL DATA:  Respiratory failure. EXAM: PORTABLE CHEST 1 VIEW COMPARISON:  08/19/2017 FINDINGS: Endotracheal tube remains present with the tip approximately 3 cm above the carina. Stable positioning pigtail chest tube on the left and right-sided Port-A-Cath. Gastric decompression  tube extends below the diaphragm. Lungs show slight increase in right basilar atelectasis. The left lower lobe shows improved aeration. No overt edema, evidence of pneumothorax or pleural fluid. IMPRESSION: Improved aeration of the left lower lung. Slight increase in right basilar atelectasis. Electronically Signed   By: Aletta Edouard M.D.   On: 08/20/2017 11:08        Scheduled Meds: . atenolol  50 mg Oral Daily  . chlorhexidine  15 mL Mouth Rinse BID  . furosemide  40 mg Intravenous Q12H  . mouth rinse  15 mL Mouth Rinse q12n4p  . methylPREDNISolone (SOLU-MEDROL) injection  40 mg Intravenous Daily  . mycophenolate  1,000 mg Oral BID  . pantoprazole sodium  40 mg Per Tube Daily  . sodium chloride flush  3 mL Intravenous Q12H   Continuous Infusions: . sodium chloride 10 mL/hr at 08/19/17 0242  . ampicillin-sulbactam (UNASYN) IV 3 g (08/21/17 0617)  . potassium chloride 10 mEq (08/21/17 1320)     LOS: 4 days    Time spent:4mins. More than 50% of that time was spent in counseling and/or coordination of care.      Shelly Coss, MD [facility] Pager [phone]  If 7PM-7AM, please contact night-coverage [email or web address] Password [facility] 08/21/2017, 3:34 PM

## 2017-08-21 NOTE — Clinical Social Work Note (Signed)
Clinical Social Work Assessment  Patient Details  Name: [name] [name] MRN: [id number] Date of Birth: 06-Mar-1940  Date of referral:  08/21/17               Reason for consult:  Facility Placement                Permission sought to share information with:  Facility Sport and exercise psychologist, Family Supports Permission granted to share information::  No(pt disoriented)  Name::     [name] [name]  Agency::  [facility]  Relationship::  SNF  Contact Information:     Housing/Transportation Living arrangements for the past 2 months:  [place] of Information:  Adult Children Patient Interpreter Needed:  None Criminal Activity/Legal Involvement Pertinent to Current Situation/Hospitalization:  No - Comment as needed Significant Relationships:  Adult Children Lives with:  Facility Resident Do you feel safe going back to the place where you live?  Yes Need for family participation in patient care:  Yes (Comment)(decision making)  Care giving concerns:  Pt at [facility] for short term rehab- no care concerns at this time.   Social Worker assessment / plan:  Pt disoriented and no documented HCPOA so spoke with next of kin, pt dtr, regarding plan for DC.  Dtr confirmed pt is from [facility]- states she was there for short term rehab but they are considering transition to LTC.  Employment status:  Retired Nurse, adult PT Recommendations:  [place] / Referral to community resources:  [place]  Patient/Family's Response to care:  Pt dtr agreeable to pt return to SNF when stable.  Patient/Family's Understanding of and Emotional Response to Diagnosis, Current Treatment, and Prognosis:  No questions or concerns- hopeful pt is well enough soon to return to SNF.  Emotional Assessment Appearance:  Appears stated age Attitude/Demeanor/Rapport:  Unable to Assess Affect (typically observed):  Unable to  Assess Orientation:    Alcohol / Substance use:  Not Applicable Psych involvement (Current and /or in the community):  No (Comment)  Discharge Needs  Concerns to be addressed:  Care Coordination Readmission within the last 30 days:  Yes Current discharge risk:  Physical Impairment Barriers to Discharge:  Continued Medical Work up   Jorge Ny, LCSW 08/21/2017, 4:15 PM

## 2017-08-21 NOTE — Evaluation (Addendum)
Physical Therapy Evaluation Patient Details Name: [name] [name] MRN: [id number] DOB: [birth year] Today's Date: 08/21/2017   History of Present Illness  77 y.o. female admitted on 08/17/17 for rectal bleeding.  Pt dx with GIB with hematochezia (s/p EGD with no signs of UGIB s/p 2 units PRBCs), syncope related to possible hemorrhagic shock vs PTX vs sepsis requiring intubation 08/17/17-08/20/17.  PTX treated with L chest tube 8/5-08/21/17.  Pt with other significant PMH of polymyositis, CVA, chronic diastolic heart failure, bil RTC repair, and lumbar laminectomy.  Clinical Impression  Pt's BP is elevated significantly outside of parameters set by MD.  RN made aware and evaluation limited to bed level assessment.  Pt responds to her name, but does not seem to maintain eye contact for long after that <15 seconds.  Pt does not follow any commands for me today and is moving restlessly in the bed.      08/21/17 1157  Vital Signs  Pulse Rate 69  Resp 19  BP (!) 197/78  BP Location Right Arm  BP Method Automatic  Patient Position (if appropriate) Lying  Oxygen Therapy  SpO2 100 %  O2 Device Nasal Cannula  O2 Flow Rate (L/min) 5 L/min     08/21/17 1207  Vital Signs  Pulse Rate 82  BP (!) 202/100 (RN made aware)  BP Location Right Arm  BP Method Automatic  Patient Position (if appropriate) Lying  Oxygen Therapy  SpO2 100 %  O2 Device Nasal Cannula  O2 Flow Rate (L/min) 5 L/min       Follow Up Recommendations SNF    Equipment Recommendations  Hospital bed;Wheelchair cushion (measurements PT);Wheelchair (measurements PT);Other (comment)(hoyer lift)    Recommendations for Other Services   NA     Precautions / Restrictions Precautions Precautions: Fall      Mobility  Bed Mobility               General bed mobility comments: Max assist to reposition in the bed in midline, pt pushes over to her left side and pillows positioned to block this push.  Left pt with lights on and in  45 degree upright trunk to try to get some benefit of upright posture.  RN made aware of elevated BPs 190s-200s/90-100  Transfers                 General transfer comment: not attempted due to BP significantly out of BP parameters despite AM BP meds given.               Pertinent Vitals/Pain Pain Assessment: Faces Faces Pain Scale: No hurt    Home Living Family/patient expects to be discharged to:: Skilled nursing facility                 Additional Comments: From SNF, chart reports WC bound at baseline, unsure of if she was assisting with transfers to [facility] or being lifted.  No family present to report baseline.     Prior Function                 Hand Dominance   Dominant Hand: Right    Extremity/Trunk Assessment   Upper Extremity Assessment Upper Extremity Assessment: RUE deficits/detail;LUE deficits/detail RUE Deficits / Details: Pt is not moving right arm as much as left arm in bed.  Edematous and A-line recently removed, BP cuff on this side.  LUE Deficits / Details: left arm is moving with no real purpose (just moving in space).  Mittens donned.  Lower Extremity Assessment Lower Extremity Assessment: RLE deficits/detail;LLE deficits/detail RLE Deficits / Details: bil LEs with significant muscle atrophy, pt has maintained DF ROM to neutral bil and is moving both legs restlessly in the bed.  Knee and hip ROM to 90 degrees without significant tone, resistance or contracture.   LLE Deficits / Details: bil LEs with significant muscle atrophy, pt has maintained DF ROM to neutral bil and is moving both legs restlessly in the bed.  Knee and hip ROM to 90 degrees without significant tone, resistance or contracture.      Cervical / Trunk Assessment Cervical / Trunk Assessment: (not formally assessed due to did not get EOB)  Communication   Communication: (not speaking)  Cognition Arousal/Alertness: Lethargic(arouses to her name) Behavior During  Therapy: Restless Overall Cognitive Status: Impaired/Different from baseline Area of Impairment: Orientation;Attention;Memory;Following commands;Safety/judgement;Awareness;Problem solving                 Orientation Level: Disoriented to;Place;Time;Situation Current Attention Level: Focused Memory: Decreased recall of precautions;Decreased short-term memory Following Commands: (does not follow commands at this time) Safety/Judgement: Decreased awareness of safety;Decreased awareness of deficits Awareness: Intellectual Problem Solving: Difficulty sequencing;Requires verbal cues;Requires tactile cues;Decreased initiation General Comments: Pt is restless in the bed, moving extremities and wreching (not sure if she is trying to spit, cough, or vomit).  Looks at me breifly when I say her name "Trudee", but is unable to respond to yes/no questions.               Assessment/Plan    PT Assessment Patient needs continued PT services  PT Problem List Decreased strength;Decreased activity tolerance;Decreased balance;Decreased mobility;Decreased coordination;Decreased cognition;Decreased knowledge of use of DME;Decreased knowledge of precautions;Cardiopulmonary status limiting activity       PT Treatment Interventions DME instruction;Functional mobility training;Therapeutic activities;Therapeutic exercise;Balance training;Neuromuscular re-education;Cognitive remediation;Patient/family education;Wheelchair mobility training    PT Goals (Current goals can be found in the Care Plan section)  Acute Rehab PT Goals Patient Stated Goal: Unable to state PT Goal Formulation: Patient unable to participate in goal setting Time For Goal Achievement: 09/04/17 Potential to Achieve Goals: Good    Frequency Min 2X/week           AM-PAC PT "6 Clicks" Daily Activity  Outcome Measure Difficulty turning over in bed (including adjusting bedclothes, sheets and blankets)?: Unable Difficulty moving from  lying on back to sitting on the side of the bed? : Unable Difficulty sitting down on and standing up from a chair with arms (e.g., wheelchair, bedside commode, etc,.)?: Unable Help needed moving to and from a bed to chair (including a wheelchair)?: Total Help needed walking in hospital room?: Total Help needed climbing 3-5 steps with a railing? : Total 6 Click Score: 6    End of Session Equipment Utilized During Treatment: Oxygen(5 L [place]) Activity Tolerance: Treatment limited secondary to medical complications (Comment)(elevated BP) Patient left: in bed;with bed alarm set;with call bell/phone within reach Nurse Communication: Other (comment)(elevated BP, limited eval) PT Visit Diagnosis: Muscle weakness (generalized) (M62.81);Difficulty in walking, not elsewhere classified (R26.2);Other symptoms and signs involving the nervous system (O96.295)    Time: 2841-3244 PT Time Calculation (min) (ACUTE ONLY): 22 min   Charges:        Wells Guiles B. Byrl Latin, PT, DPT [phone]   PT Evaluation $PT Eval Moderate Complexity: 1 Mod         08/21/2017, 12:59 PM

## 2017-08-21 NOTE — Progress Notes (Addendum)
Performed bedside swallow eval. Pt provided w/ ice chip which unable to swallow. ST at bedside, pt failed swallow eval. Per ST, pt not ready for PO. All relayed to CCM and [facility]. Per CCM, hold all oral meds for today, including cellcept.

## 2017-08-21 NOTE — NC FL2 (Signed)
[place] LEVEL OF CARE SCREENING TOOL     IDENTIFICATION  Patient Name: [name] [name] Birthdate: 08-02-1940 Sex: female Admission Date (Current Location): 08/17/2017  [facility] and [place] Number:  Herbalist and Address:  [facility]. [facility], [place] [address] [place], [place] [address]      Provider Number: [id number]  Attending Physician Name and Address:  Shelly Coss, MD  Relative Name and Phone Number:       Current Level of Care: Hospital Recommended Level of Care: [place] Prior Approval Number:    Date Approved/Denied:   PASRR Number: [id number]  Discharge Plan: SNF    Current Diagnoses: Patient Active Problem List   Diagnosis Date Noted  . Acute upper GI bleed 08/17/2017  . Nontraumatic hemorrhagic shock ([place]) 08/17/2017  . Hyperlipidemia 07/26/2017  . Pressure ulcer of buttock 07/22/2017  . Neck mass 07/21/2017  . Neuropathic pain   . 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 Neurologic deficit due to acute ischemic stroke ([place])   . Hypokalemia   . CVA (cerebral vascular accident) ([place]) 06/09/2017  . Multiple pulmonary nodules 06/09/2017  . Hypothermia 10/22/2016  . Chronic diastolic CHF (congestive heart failure) ([place]) 10/22/2016  . Abdominal pain, unspecified site 05/17/2013  . Nausea alone 05/17/2013  . 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 Abdominal pain, left lower quadrant 05/28/2011  . Cecal ulcer with hemorrhage 01/10/2011  . Acute blood loss anemia 12/06/2010  . Anemia 12/05/2010  . Adrenal insufficiency ([place]) 12/05/2010  . Osteoporosis 12/05/2010  . LUNG INVOLVEMENT OTHER DISEASES CLASSIFIED ELSW 08/13/2009  . OTHER SPECIFIED DISORDER OF STOMACH AND DUODENUM 07/06/2008  . Essential hypertension 04/26/2007  . ALLERGIC RHINITIS 04/26/2007  . GASTROESOPHAGEAL REFLUX DISEASE 04/26/2007  . Polymyositis ([place]) 04/26/2007  . COLONIC POLYPS,  ADENOMATOUS 02/06/2005    Orientation RESPIRATION BLADDER Height & Weight        O2(5L [place]) Incontinent, Indwelling catheter Weight: 153 lb 14.1 oz (69.8 kg) Height:  5\' 2"  (157.5 cm)  BEHAVIORAL SYMPTOMS/MOOD NEUROLOGICAL BOWEL NUTRITION STATUS      Incontinent Diet(see DC summary)  AMBULATORY STATUS COMMUNICATION OF NEEDS Skin   Extensive Assist Verbally Normal                       Personal Care Assistance Level of Assistance  Bathing, Dressing Bathing Assistance: Maximum assistance   Dressing Assistance: Maximum assistance     Functional Limitations Info             SPECIAL CARE FACTORS FREQUENCY  PT (By licensed PT), OT (By licensed OT), Speech therapy     PT Frequency: 5/wk OT Frequency: 5/wk     Speech Therapy Frequency: 2/wk      Contractures      Additional Factors Info  Code Status, Allergies Code Status Info: DNR Allergies Info: Codeine, Hydrocodone, Tramadol Hcl           Current Medications (08/21/2017):  This is the current hospital active medication list Current Facility-Administered Medications  Medication Dose Route Frequency Provider Last Rate Last Dose  . 0.9 %  sodium chloride infusion   Intra-arterial PRN [name], MD 10 mL/hr at 08/19/17 0242    . acetaminophen (TYLENOL) tablet 650 mg  650 mg Oral Q6H PRN Karmen Bongo, MD   650 mg at 08/20/17 1027   Or  . acetaminophen (TYLENOL) suppository 650 mg  650 mg Rectal Q6H PRN  Karmen Bongo, MD      . Ampicillin-Sulbactam (UNASYN) 3 g in sodium chloride 0.9 % 100 mL IVPB  3 g Intravenous Q8H Rush Farmer, MD 200 mL/hr at 08/21/17 0617 3 g at 08/21/17 0617  . atenolol (TENORMIN) tablet 50 mg  50 mg Oral Daily Rigoberto Noel, MD   50 mg at 08/20/17 0950  . chlorhexidine (PERIDEX) 0.12 % solution 15 mL  15 mL Mouth Rinse BID Kara Mead V, MD   15 mL at 08/21/17 1121  . furosemide (LASIX) injection 40 mg  40 mg Intravenous Q12H Desai, Rahul P, PA-C   40 mg at 08/21/17 1022  .  hydrALAZINE (APRESOLINE) injection 10-40 mg  10-40 mg Intravenous Q4H PRN Rigoberto Noel, MD   20 mg at 08/21/17 1224  . labetalol (NORMODYNE,TRANDATE) injection 10-20 mg  10-20 mg Intravenous Q2H PRN Rigoberto Noel, MD   20 mg at 08/21/17 1315  . MEDLINE mouth rinse  15 mL Mouth Rinse q12n4p Rigoberto Noel, MD   15 mL at 08/21/17 1220  . methylPREDNISolone sodium succinate (SOLU-MEDROL) 40 mg/mL injection 40 mg  40 mg Intravenous Daily Desai, Rahul P, PA-C   40 mg at 08/21/17 1024  . mycophenolate (CELLCEPT) capsule 1,000 mg  1,000 mg Oral BID Rush Farmer, MD      . ondansetron [facility]) tablet 4 mg  4 mg Oral Q6H PRN Karmen Bongo, MD       Or  . ondansetron [facility]) injection 4 mg  4 mg Intravenous Q6H PRN Karmen Bongo, MD      . pantoprazole sodium (PROTONIX) 40 mg/20 mL oral suspension 40 mg  40 mg Per Tube Daily Rush Farmer, MD   40 mg at 08/20/17 0947  . potassium chloride 10 mEq in 100 mL IVPB  10 mEq Intravenous Q1 Hr x 6 Desai, Rahul P, PA-C 100 mL/hr at 08/21/17 1320 10 mEq at 08/21/17 1320  . sodium chloride flush (NS) 0.9 % injection 3 mL  3 mL Intravenous Q12H Karmen Bongo, MD   3 mL at 08/21/17 1026     Discharge Medications: Please see discharge summary for a list of discharge medications.  Relevant Imaging Results:  Relevant Lab Results:   Additional Information SSN [id number]  Jorge Ny, LCSW

## 2017-08-21 NOTE — Evaluation (Signed)
Clinical/Bedside Swallow Evaluation Patient Details  Name: [name] [name] MRN: [id number] Date of Birth: Sep 22, 1940  Today's Date: 08/21/2017 Time: SLP Start Time (ACUTE ONLY): 0846 SLP Stop Time (ACUTE ONLY): 0856 SLP Time Calculation (min) (ACUTE ONLY): 10 min  Past Medical History:  Past Medical History:  Diagnosis Date  . Allergic rhinitis, cause unspecified   . Benign neoplasm of colon   . Chronic diastolic CHF (congestive heart failure) ([place])   . CVA (cerebral vascular accident) ([place]) 05/2017  . Esophageal reflux   . Excessive daytime sleepiness   . 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 GI hemorrhage 2011   recurrent  . Hyperlipidemia   . Osteoporosis   . Polymyositis ([place])   . Unspecified essential hypertension   . Vitamin D deficiency    Past Surgical History:  Past Surgical History:  Procedure Laterality Date  . ABDOMINAL HYSTERECTOMY     partial  . BACK SURGERY    . COLONOSCOPY  01/09/2011   others also  . COLONOSCOPY  05/29/2011   Procedure: COLONOSCOPY;  Surgeon: Gatha Mayer, MD;  Location: [facility] ENDOSCOPY;  Service: Endoscopy;  Laterality: N/A;  Greggory Brandy Carlean Purl  . COLONOSCOPY WITH PROPOFOL N/A 02/16/2014   Procedure: COLONOSCOPY WITH PROPOFOL;  Surgeon: Milus Banister, MD;  Location: [facility] ENDOSCOPY;  Service: Endoscopy;  Laterality: N/A;  . ESOPHAGOGASTRODUODENOSCOPY  12/08/2010   others also  . ESOPHAGOGASTRODUODENOSCOPY N/A 08/17/2017   Procedure: ESOPHAGOGASTRODUODENOSCOPY (EGD);  Surgeon: Jackquline Denmark, MD;  Location: [facility] ENDOSCOPY;  Service: Endoscopy;  Laterality: N/A;  . ESOPHAGOGASTRODUODENOSCOPY (EGD) WITH PROPOFOL N/A 02/16/2014   Procedure: ESOPHAGOGASTRODUODENOSCOPY (EGD) WITH PROPOFOL;  Surgeon: Milus Banister, MD;  Location: [facility] ENDOSCOPY;  Service: Endoscopy;  Laterality: N/A;  . EUS    . HOT HEMOSTASIS  05/29/2011   Procedure: HOT HEMOSTASIS (ARGON PLASMA COAGULATION/BICAP);  Surgeon: Gatha Mayer, MD;  Location: [facility];  Service: Endoscopy;   Laterality: N/A;  . LUMBAR LAMINECTOMY    . ROTATOR CUFF REPAIR Bilateral   . TONSILLECTOMY  age 2   HPI:  Pt is a 29yoF with hx Polymyositis, CVA, Gastric leiomyoma, HTN, GERD, and dCHF, who presented to ER with BRBPR, hypotension, and AMS. Difficult intubation 8/5, extubated 8/8. EGD showed no signs of bleeding. CXR post-intubation showed L PTX. CT Head negative. BSE 06/13/17 recommended Dys 3 diet and thin liquids, advanced to regular textures prior to d/c. MBS in May 2019 showed an overall functional swallow with a single incident of flash penetration. Pt was noted to cough during MBS but not in the setting of airway compromise. There was also concern for a prominent CP and a possible Zencker's diverticulum developing, but no backflow was observed. Dys 3 diet and thin liquids were recommended at that time.   Assessment / Plan / Recommendation Clinical Impression  Pt has poor bolus awareness with reduced oral acceptance, labial seal, and lingual manipulation. She is easily distractible, requiring Max cues to perform minimal amounts of oral preparation. Ice chips ultimately melt in her oral cavity, with oral suction needed. Wet vocal quality and delayed coughing are also concerning for premature spillage into the airway. Although her voice sounds wet, she does not sound significantly hoarse. At baseline pt consumes regular textures and thin liquids per chart review; therefore, anticipate good prognosis for return to PO diet; however, at this time her mental status, prolonged intubation, and overall decondition put her at a high risk for aspiration. Would maintain NPO status for now. SLP Visit  Diagnosis: Dysphagia, unspecified (R13.10)     Aspiration Risk  Severe aspiration risk    Diet Recommendation NPO   Medication Administration: Via alternative means    Other  Recommendations Oral Care Recommendations: Oral care QID Other Recommendations: Have oral suction available   Follow up  Recommendations (tba)      Frequency and Duration min 3x week  2 weeks       Prognosis Prognosis for Safe Diet Advancement: Good Barriers to Reach Goals: Cognitive deficits      Swallow Study   General HPI: Pt is a 58yoF with hx Polymyositis, CVA, Gastric leiomyoma, HTN, GERD, and dCHF, who presented to ER with BRBPR, hypotension, and AMS. Difficult intubation 8/5, extubated 8/8. EGD showed no signs of bleeding. CXR post-intubation showed L PTX. CT Head negative. BSE 06/13/17 recommended Dys 3 diet and thin liquids, advanced to regular textures prior to d/c. MBS in May 2019 showed an overall functional swallow with a single incident of flash penetration. Pt was noted to cough during MBS but not in the setting of airway compromise. There was also concern for a prominent CP and a possible Zencker's diverticulum developing, but no backflow was observed. Dys 3 diet and thin liquids were recommended at that time. Type of Study: Bedside Swallow Evaluation Previous Swallow Assessment: see HPI Diet Prior to this Study: NPO Temperature Spikes Noted: No Respiratory Status: Nasal cannula History of Recent Intubation: Yes Length of Intubations (days): 4 days Date extubated: 08/20/17 Behavior/Cognition: Alert;Requires cueing;Distractible Oral Cavity Assessment: Within Functional Limits Oral Care Completed by SLP: Recent completion by staff Oral Cavity - Dentition: Adequate natural dentition Self-Feeding Abilities: Total assist Patient Positioning: Upright in bed Baseline Vocal Quality: Wet Volitional Cough: Cognitively unable to elicit Volitional Swallow: Unable to elicit    Oral/Motor/Sensory Function Overall Oral Motor/Sensory Function: (cognitively difficult to follow commands)   Ice Chips Ice chips: Impaired Presentation: Spoon Oral Phase Impairments: Poor awareness of bolus;Reduced labial seal;Reduced lingual movement/coordination Oral Phase Functional Implications: Oral  holding Pharyngeal Phase Impairments: Wet Vocal Quality;Cough - Delayed   Thin Liquid Thin Liquid: Impaired Presentation: Spoon Oral Phase Impairments: Reduced labial seal;Reduced lingual movement/coordination;Poor awareness of bolus Oral Phase Functional Implications: Right anterior spillage;Left anterior spillage;Oral holding Pharyngeal  Phase Impairments: Unable to trigger swallow    Nectar Thick Nectar Thick Liquid: Not tested   Honey Thick Honey Thick Liquid: Not tested   Puree Puree: Not tested   Solid     Solid: Not tested      Germain Osgood 08/21/2017,9:14 AM  Germain Osgood, M.A. CCC-SLP [phone]

## 2017-08-22 ENCOUNTER — Inpatient Hospital Stay (HOSPITAL_COMMUNITY): Payer: Medicare Other

## 2017-08-22 DIAGNOSIS — E2740 Unspecified adrenocortical insufficiency: Secondary | ICD-10-CM

## 2017-08-22 DIAGNOSIS — D62 Acute posthemorrhagic anemia: Secondary | ICD-10-CM

## 2017-08-22 LAB — CULTURE, BLOOD (ROUTINE X 2): Culture: NO GROWTH

## 2017-08-22 LAB — CBC WITH DIFFERENTIAL/PLATELET
Abs Immature Granulocytes: 0.2 10*3/uL — ABNORMAL HIGH (ref 0.0–0.1)
Basophils Absolute: 0 10*3/uL (ref 0.0–0.1)
Basophils Relative: 0 %
EOS ABS: 0 10*3/uL (ref 0.0–0.7)
EOS PCT: 0 %
HEMATOCRIT: 31.8 % — AB (ref 36.0–46.0)
HEMOGLOBIN: 10.3 g/dL — AB (ref 12.0–15.0)
Immature Granulocytes: 2 %
LYMPHS ABS: 1 10*3/uL (ref 0.7–4.0)
LYMPHS PCT: 10 %
MCH: 26.1 pg (ref 26.0–34.0)
MCHC: 32.4 g/dL (ref 30.0–36.0)
MCV: 80.7 fL (ref 78.0–100.0)
MONO ABS: 1 10*3/uL (ref 0.1–1.0)
MONOS PCT: 10 %
Neutro Abs: 8 10*3/uL — ABNORMAL HIGH (ref 1.7–7.7)
Neutrophils Relative %: 78 %
Platelets: 221 10*3/uL (ref 150–400)
RBC: 3.94 MIL/uL (ref 3.87–5.11)
RDW: 18 % — ABNORMAL HIGH (ref 11.5–15.5)
WBC: 10.2 10*3/uL (ref 4.0–10.5)

## 2017-08-22 LAB — BASIC METABOLIC PANEL
Anion gap: 13 (ref 5–15)
BUN: 10 mg/dL (ref 8–23)
CHLORIDE: 99 mmol/L (ref 98–111)
CO2: 28 mmol/L (ref 22–32)
CREATININE: 0.48 mg/dL (ref 0.44–1.00)
Calcium: 9 mg/dL (ref 8.9–10.3)
GFR calc Af Amer: >60 mL/min (ref >60)
GFR calc non Af Amer: >60 mL/min (ref >60)
GLUCOSE: 70 mg/dL (ref 70–99)
Potassium: 3.6 mmol/L (ref 3.5–5.1)
Sodium: 140 mmol/L (ref 135–145)

## 2017-08-22 LAB — GLUCOSE, CAPILLARY
GLUCOSE-CAPILLARY: 68 mg/dL — AB (ref 70–99)
GLUCOSE-CAPILLARY: 111 mg/dL — AB (ref 70–99)
GLUCOSE-CAPILLARY: 128 mg/dL — AB (ref 70–99)
Glucose-Capillary: 72 mg/dL (ref 70–99)
Glucose-Capillary: 214 mg/dL — ABNORMAL HIGH (ref 70–99)
Glucose-Capillary: 86 mg/dL (ref 70–99)

## 2017-08-22 MED ORDER — LORAZEPAM 2 MG/ML IJ SOLN
1.0000 mg | Freq: Once | INTRAMUSCULAR | Status: AC
  Administered 2017-08-22: 1 mg via INTRAVENOUS
  Filled 2017-08-22: qty 1

## 2017-08-22 MED ORDER — ATENOLOL 50 MG PO TABS
50.0000 mg | ORAL_TABLET | Freq: Two times a day (BID) | ORAL | Status: DC
  Filled 2017-08-22 (×3): qty 1

## 2017-08-22 MED ORDER — DEXTROSE 5 % IV SOLN
INTRAVENOUS | Status: DC
  Administered 2017-08-22 – 2017-08-24 (×3): via INTRAVENOUS

## 2017-08-22 MED ORDER — MYCOPHENOLATE MOFETIL HCL 500 MG IV SOLR
1000.0000 mg | Freq: Two times a day (BID) | INTRAVENOUS | Status: AC
  Administered 2017-08-22 – 2017-08-25 (×8): 1000 mg via INTRAVENOUS
  Filled 2017-08-22 (×5): qty 30
  Filled 2017-08-22: qty 15
  Filled 2017-08-22 (×4): qty 30

## 2017-08-22 MED ORDER — METOPROLOL TARTRATE 5 MG/5ML IV SOLN
5.0000 mg | INTRAVENOUS | Status: DC | PRN
  Administered 2017-08-22 – 2017-08-23 (×4): 5 mg via INTRAVENOUS
  Filled 2017-08-22 (×4): qty 5

## 2017-08-22 MED ORDER — DEXTROSE 50 % IV SOLN
INTRAVENOUS | Status: AC
  Administered 2017-08-22: 50 mL
  Filled 2017-08-22: qty 50

## 2017-08-22 MED ORDER — PANTOPRAZOLE SODIUM 40 MG IV SOLR
40.0000 mg | INTRAVENOUS | Status: DC
  Administered 2017-08-22 – 2017-08-25 (×4): 40 mg via INTRAVENOUS
  Filled 2017-08-22 (×4): qty 40

## 2017-08-22 NOTE — Progress Notes (Signed)
Paged Zigmund Daniel, MD d/t pts SVT HR max in 414'Q, & BP systolic in 360'X, pt seems restless and moaning, will not follow commands, family expressing concern for pt being in pain.   Received orders for Metoprolol & Ativan, will continue to monitor patient.

## 2017-08-22 NOTE — Progress Notes (Addendum)
PROGRESS NOTE    [name] [name]  JQB:[id number] DOB: Dec 19, 1940 DOA: 08/17/2017 PCP: [name], MD   Brief Narrative61 year old female with history of polymyositis, CVA, gastric leiomyoma, hypertension, GERD, diastolic CHF was brought to the emergency department with complaints of bright red blood per rectum.  Patient is from a facility . She had episodes of hypotension and unresponsiveness in the emergency department and she required intubation .GI was also consulted.  She underwent 2 unit of PRBC transfusion.  Chest x-ray post intubation showed left pneumothorax.  She was extubated on 8/8. Patient transferred to hospitalist service   Assessment & Plan:   Active Problems:   Polymyositis (HCC)   Adrenal insufficiency (HCC)   Acute blood loss anemia   CVA (cerebral vascular accident) ([name] [name])   Acute upper GI bleed   Nontraumatic hemorrhagic shock ([name] [name]) GI bleed/hematochezia/suspected hemorrhagic shock: Status post EGD. currently H&H stable.  Status post transfusion.  GI following.  Aspirin, Plavix on hold for GI bleed.  EGD on 8/5 showed no active upper GI bleed.  CT abdomen/pelvis did not show any acute bleed.  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  GI not planning for further endoscopic work-up.DW DAUGHTER WILL GET PALLIATIVE CARE CONSULT.  Acute respiratory failure: She had to be intubated for airway protection.  Extubated on 8/8.  PCCM was following.   CT also showed left lower lobe consolidation.  Pulmonary edema/Bilateral pleural effusion/history of diastolic heart failure: PCCM he started on Lasix 40 mg IV twice daily.  She is also on Lasix at home.repeat chest xray 8/10 improved.  Acute encephalopathy: Thought to be secondary to hypoperfusion, aspiration pneumonia.  She has history of a stroke in the past.  CT imagings were negative for acute intracranial abnormalities.  Hypertension: Continue current medications.  Continue to monitor blood  pressure.  Aspiration pneumonia: On Unasyn.  Cultures negative so far.  She has mild white cell count elevation.  But she is also chronically on steroids. Speech is following and recommending n.p.o. for now.  We hope that she will be able to eat soon and  we can start on diet.  Left-sided pneumothorax: chest tube removed.  Syncope: Most likely vasovagal secondary to hemorrhagic shock.  2 episodes of unresponsiveness in the emergency department.  Patient was admitted under ICU service.  She was intubated.  Currently extubated  Polymyositis with history of chronic adrenal insufficiency: On CellCept and prednisone. Follows with rheumatologist with Dr. Amil Amen. Currently patient is n.p.o.  She has a port  on her right chest for blood draws because she is hard stick.  History history of CVA: On aspirin and Plavix.  Both on hold for now.  Hypertension: On atenolol at home.  Currently on hold .  Will resume on discharge.  Hyperlipidemia: On Lovaza at home.  Hypokalemia: Supplemented.  Debility/deconditioning: Due to CVA, Polymyositis. Bed bound since last several years.Has episodes of confusion.Has contractures on bilateral lower extremities. PT following.  Recommending skilled nursing facility on discharge.  Social worker following.  Nutrition failed swallow evaluation again.will replace ngt for tube feeds. DVT prophylaxis: scd Code Status: dnr Family Communication:dw [name].will consult palliative care  Disposition Plan: tbd   Consultants:   pccm gi  Procedures egd Antimicrobials unasyn Subjective: Patient very restless non verbal tachycardic   Objective: Vitals:   08/22/17 1100 08/22/17 1134 08/22/17 1146 08/22/17 1200  BP: (!) 161/93 (!) 176/146  (!) 138/49  Pulse: 74   81  Resp: (!) 22   16  Temp:  98.3 F (36.8 C)   TempSrc:   Axillary   SpO2: 100%   99%  Weight:      Height:        Intake/Output Summary (Last 24 hours) at 08/22/2017  1232 Last data filed at 08/22/2017 1200 Gross per 24 hour  Intake 851.36 ml  Output 3000 ml  Net -2148.64 ml   Filed Weights   08/20/17 0300 08/21/17 0313 08/22/17 0500  Weight: 70.3 kg 69.8 kg 66.4 kg    Examination:chonically ill /sickappearing   General exam: Appears calm and comfortable  Respiratory system: Clear to auscultation. Respiratory effort normal. Cardiovascular system: S1 & S2 heard, RRR. No JVD, murmurs, rubs, gallops or clicks. No pedal edema. Gastrointestinal system: Abdomen is nondistended, soft and nontender. No organomegaly or masses felt. Normal bowel sounds heard. Extremities: Symmetric 5 x 5 power. Skin: No rashes, lesions or ulcers    Data Reviewed: I have personally reviewed following labs and imaging studies  CBC: Recent Labs  Lab 08/18/17 2000 08/19/17 0500 08/20/17 0428 08/21/17 0436 08/22/17 0425  WBC 16.6* 13.1* 15.6* 14.5* 10.2  NEUTROABS  --   --  13.9*  --  8.0*  HGB 9.1* 8.5* 9.0* 10.1* 10.3*  HCT 27.9* 25.5* 27.4* 30.7* 31.8*  MCV 81.1 81.2 80.8 80.4 80.7  PLT 222 200 212 202 294   Basic Metabolic Panel: Recent Labs  Lab 08/18/17 1129 08/18/17 1712 08/19/17 0500 08/20/17 0428 08/21/17 0436 08/21/17 1603 08/22/17 0425  NA  --   --  145 145 143 142 140  K  --   --  2.7* 3.3* 3.3* 4.0 3.6  CL  --   --  116* 116* 107 103 99  CO2  --   --  20* 20* 26 28 28   GLUCOSE  --   --  166* 136* 79 103* 70  BUN  --   --  19 15 9 10 10   CREATININE  --   --  0.49 0.38* 0.34* 0.45 0.48  CALCIUM  --   --  7.6* 7.9* 8.5* 8.8* 9.0  MG 1.8 1.9 2.0 1.8 1.7  --   --   PHOS 3.0 2.7 2.3* 1.3* 2.5  --   --    GFR: Estimated Creatinine Clearance: 52.6 mL/min (by C-G formula based on SCr of 0.48 mg/dL). Liver Function Tests: Recent Labs  Lab 08/17/17 0638  AST 16  ALT 18  ALKPHOS 43  BILITOT 0.8  PROT 5.7*  ALBUMIN 3.2*   No results for input(s): LIPASE, AMYLASE in the last 168 hours. No results for input(s): AMMONIA in the last 168  hours. Coagulation Profile: Recent Labs  Lab 08/17/17 0638 08/18/17 0416  INR 0.94 1.13   Cardiac Enzymes: No results for input(s): CKTOTAL, CKMB, CKMBINDEX, TROPONINI in the last 168 hours. BNP (last 3 results) No results for input(s): PROBNP in the last 8760 hours. HbA1C: No results for input(s): HGBA1C in the last 72 hours. CBG: Recent Labs  Lab 08/21/17 2344 08/22/17 0342 08/22/17 0739 08/22/17 0818 08/22/17 1145  GLUCAP 71 72 68* 214* 86   Lipid Profile: No results for input(s): CHOL, HDL, LDLCALC, TRIG, CHOLHDL, LDLDIRECT in the last 72 hours. Thyroid Function Tests: No results for input(s): TSH, T4TOTAL, FREET4, T3FREE, THYROIDAB in the last 72 hours. Anemia Panel: No results for input(s): VITAMINB12, FOLATE, FERRITIN, TIBC, IRON, RETICCTPCT in the last 72 hours. Sepsis Labs: Recent Labs  Lab 08/17/17 2030 08/18/17 0113 08/18/17 0530 08/18/17 1042  PROCALCITON 0.27  --   --   --  LATICACIDVEN 2.8* 3.0* 2.1* 2.1*    Recent Results (from the past 240 hour(s))  MRSA PCR Screening     Status: None   Collection Time: 08/17/17  1:17 PM  Result Value Ref Range Status   MRSA by PCR NEGATIVE NEGATIVE Final    Comment:        The GeneXpert MRSA Assay (FDA approved for NASAL specimens only), is one component of a comprehensive MRSA colonization surveillance program. It is not intended to diagnose MRSA infection nor to guide or monitor treatment for MRSA infections. Performed at [facility], [place] [address], [place], [place] [address]   Culture, respiratory (non-expectorated)     Status: None   Collection Time: 08/17/17  8:30 PM  Result Value Ref Range Status   Specimen Description TRACHEAL ASPIRATE  Final   Special Requests NONE  Final   Gram Stain   Final    ABUNDANT WBC PRESENT, PREDOMINANTLY PMN ABUNDANT GRAM POSITIVE COCCI RARE GRAM POSITIVE RODS    Culture   Final    Consistent with normal respiratory flora. Performed at [facility], [place] [address] [place], [place] [address]    Report Status 08/20/2017 FINAL  Final  Culture, blood (routine x 2)     Status: None   Collection Time: 08/17/17  9:33 PM  Result Value Ref Range Status   Specimen Description BLOOD RIGHT HAND  Final   Special Requests   Final    BOTTLES DRAWN AEROBIC ONLY Blood Culture results may not be optimal due to an inadequate volume of blood received in culture bottles   Culture   Final    NO GROWTH 5 DAYS Performed at [facility], [place] [address], [place], [place] [address]    Report Status 08/22/2017 FINAL  Final  Body fluid culture     Status: None   Collection Time: 08/18/17 10:44 AM  Result Value Ref Range Status   Specimen Description FLUID PLEURAL LEFT  Final   Special Requests Immunocompromised  Final   Gram Stain   Final    ABUNDANT WBC PRESENT, PREDOMINANTLY PMN NO ORGANISMS SEEN    Culture   Final    NO GROWTH 3 DAYS Performed at [facility], [place] [address] [place], [place] [address]    Report Status 08/21/2017 FINAL  Final  Culture, respiratory     Status: None   Collection Time: 08/18/17 11:29 AM  Result Value Ref Range Status   Specimen Description TRACHEAL ASPIRATE  Final   Special Requests NONE  Final   Gram Stain   Final    FEW WBC PRESENT, PREDOMINANTLY PMN RARE GRAM POSITIVE COCCI    Culture   Final    Consistent with normal respiratory flora. Performed at [facility], [place] [address] [place], [place] [address]    Report Status 08/20/2017 FINAL  Final         Radiology Studies: Dg Chest 1 View  Result Date: 08/22/2017 CLINICAL DATA:  Hypoxia, altered mental status EXAM: CHEST  1 VIEW COMPARISON:  the previous day's study FINDINGS: The left pigtail drain catheter is been removed. No pneumothorax evident. Blunting of the left lateral costophrenic angle suggesting small effusion. Stable right IJ port catheter. Atelectasis/infiltrate in the lung bases left greater than right, improved  since prior study. Heart size upper limits normal for technique. Aortic Atherosclerosis (ICD10-170.0). Regional bones unremarkable. IMPRESSION: 1. Interval removal of left chest tube with no pneumothorax. 2. Possible  small left pleural effusion. 3. Bibasilar atelectasis/consolidation, improved since previous exam Electronically Signed   By: Lucrezia Europe M.D.   On: 08/22/2017 10:04   Dg Chest Port 1 View  Result Date: 08/21/2017 CLINICAL DATA:  History of pneumothorax.  Chest tube in place. EXAM: PORTABLE CHEST 1 VIEW COMPARISON:  August 20, 2016 FINDINGS: No pneumothorax. The left chest tube is stable. A layering effusion with underlying opacity in seen on the right. Probable edema. No nodules or masses. Stable cardiomediastinal silhouette. Stable right Port-A-Cath. IMPRESSION: 1. Left-sided chest tube.  No pneumothorax. 2. Layering effusions bilaterally, right greater than left underlying atelectasis. 3. Pulmonary edema. Electronically Signed   By: Dorise Bullion III M.D   On: 08/21/2017 09:49   Dg Chest Port 1 View  Result Date: 08/20/2017 CLINICAL DATA:  Assess for pneumothorax. EXAM: PORTABLE CHEST 1 VIEW COMPARISON:  Chest radiograph performed earlier today at 4:32 a.m. FINDINGS: A right-sided chest port is noted ending about the mid SVC. Small bilateral pleural effusions are noted. Increased interstitial markings raise concern for pulmonary edema, worsened from the prior study. No pneumothorax is seen. A left chest pigtail catheter is noted in essentially unchanged position. The cardiomediastinal silhouette is borderline normal in size. No acute osseous abnormalities are seen. IMPRESSION: 1. No pneumothorax seen. Left chest pigtail catheter noted in essentially unchanged position. 2. Small bilateral pleural effusions noted. Increased interstitial markings raise concern for pulmonary edema, worsened from the prior study. Electronically Signed   By: Garald Balding M.D.   On: 08/20/2017 22:50         Scheduled Meds: . atenolol  50 mg Oral BID  . chlorhexidine  15 mL Mouth Rinse BID  . mouth rinse  15 mL Mouth Rinse q12n4p  . methylPREDNISolone (SOLU-MEDROL) injection  40 mg Intravenous Daily  . pantoprazole (PROTONIX) IV  40 mg Intravenous Q24H  . sodium chloride flush  3 mL Intravenous Q12H   Continuous Infusions: . sodium chloride 10 mL/hr at 08/19/17 0242  . ampicillin-sulbactam (UNASYN) IV Stopped (08/22/17 0550)  . dextrose Stopped (08/22/17 1053)  . mycophenolate (CELLCEPT) IV 85 mL/hr at 08/22/17 1200     LOS: 5 days     Georgette Shell, MD [facility]  If 7PM-7AM, please contact night-coverage [email or web address] Password [facility] 08/22/2017, 12:32 PM

## 2017-08-22 NOTE — Progress Notes (Signed)
Hypoglycemic Event  CBG: 68 at 0739  Treatment: amp d50  Symptoms: none at this time  Follow-up CBG: Time: 0818 CBG Result: 214  Possible Reasons for Event: Pt is NPO  Comments/MD notified: [email or web address], MD paged, awaiting new orders, will continue to monitor     [employer]

## 2017-08-22 NOTE — Progress Notes (Signed)
  Speech Language Pathology Treatment: Dysphagia  Patient Details Name: [name] [name] MRN: [id number] DOB: [birth year] Today's Date: 08/22/2017 Time: 0973-5329 SLP Time Calculation (min) (ACUTE ONLY): 8 min  Assessment / Plan / Recommendation Clinical Impression  ST follow up for PO readiness.  Oral care was provided using suction.  Nursing reported no significant change in patient since ST last saw her.  Attempted oral mechanism exam and the patient was unable to follow commands despite max cues.  Given PO's (ie ice chip and tsp bolus of thins) patient is with absent oral response and required oral suctioning.  Given this recommend that the patient remain NPO pending re assessment for PO readiness.  Nursing reported that there are orders for placement of a cortrak.  ST will follow up.   HPI HPI: Pt is a 7yoF with hx Polymyositis, CVA, Gastric leiomyoma, HTN, GERD, and dCHF, who presented to ER with BRBPR, hypotension, and AMS. Difficult intubation 8/5, extubated 8/8. EGD showed no signs of bleeding. CXR post-intubation showed L PTX. CT Head negative. BSE 06/13/17 recommended Dys 3 diet and thin liquids, advanced to regular textures prior to d/c. MBS in May 2019 showed an overall functional swallow with a single incident of flash penetration. Pt was noted to cough during MBS but not in the setting of airway compromise. There was also concern for a prominent CP and a possible Zencker's diverticulum developing, but no backflow was observed. Dys 3 diet and thin liquids were recommended at that time.      SLP Plan  Continue with current plan of care       Recommendations  Diet recommendations: NPO Medication Administration: Via alternative means                Plan: Continue with current plan of care       [place], [place], [facility] SLP [phone] Lamar Sprinkles 08/22/2017, 9:52 AM

## 2017-08-23 ENCOUNTER — Inpatient Hospital Stay (HOSPITAL_COMMUNITY): Payer: Medicare Other

## 2017-08-23 LAB — BASIC METABOLIC PANEL
ANION GAP: 13 (ref 5–15)
BUN: 10 mg/dL (ref 8–23)
CO2: 29 mmol/L (ref 22–32)
Calcium: 8.8 mg/dL — ABNORMAL LOW (ref 8.9–10.3)
Chloride: 97 mmol/L — ABNORMAL LOW (ref 98–111)
Creatinine, Ser: 0.5 mg/dL (ref 0.44–1.00)
Glucose, Bld: 90 mg/dL (ref 70–99)
Potassium: 3.3 mmol/L — ABNORMAL LOW (ref 3.5–5.1)
Sodium: 139 mmol/L (ref 135–145)

## 2017-08-23 LAB — GLUCOSE, CAPILLARY
GLUCOSE-CAPILLARY: 86 mg/dL (ref 70–99)
GLUCOSE-CAPILLARY: 81 mg/dL (ref 70–99)
GLUCOSE-CAPILLARY: 116 mg/dL — AB (ref 70–99)
GLUCOSE-CAPILLARY: 126 mg/dL — AB (ref 70–99)
GLUCOSE-CAPILLARY: 133 mg/dL — AB (ref 70–99)

## 2017-08-23 LAB — CBC WITH DIFFERENTIAL/PLATELET
Abs Immature Granulocytes: 0.2 10*3/uL — ABNORMAL HIGH (ref 0.0–0.1)
Basophils Absolute: 0 10*3/uL (ref 0.0–0.1)
Basophils Relative: 0 %
EOS ABS: 0 10*3/uL (ref 0.0–0.7)
Eosinophils Relative: 0 %
HEMATOCRIT: 31.2 % — AB (ref 36.0–46.0)
Hemoglobin: 10.1 g/dL — ABNORMAL LOW (ref 12.0–15.0)
IMMATURE GRANULOCYTES: 2 %
Lymphocytes Relative: 10 %
Lymphs Abs: 0.8 10*3/uL (ref 0.7–4.0)
MCH: 26.3 pg (ref 26.0–34.0)
MCHC: 32.4 g/dL (ref 30.0–36.0)
MCV: 81.3 fL (ref 78.0–100.0)
Monocytes Absolute: 0.8 10*3/uL (ref 0.1–1.0)
Monocytes Relative: 9 %
NEUTROS ABS: 7 10*3/uL (ref 1.7–7.7)
NEUTROS PCT: 79 %
PLATELETS: 248 10*3/uL (ref 150–400)
RBC: 3.84 MIL/uL — AB (ref 3.87–5.11)
RDW: 17.9 % — AB (ref 11.5–15.5)
WBC: 8.8 10*3/uL (ref 4.0–10.5)

## 2017-08-23 MED ORDER — MORPHINE SULFATE (PF) 4 MG/ML IV SOLN
4.0000 mg | INTRAVENOUS | Status: DC | PRN
  Administered 2017-08-23: 4 mg via INTRAVENOUS
  Filled 2017-08-23: qty 1

## 2017-08-23 MED ORDER — LORAZEPAM 2 MG/ML IJ SOLN: 1.0000 mg | Freq: Once | INTRAMUSCULAR | Status: AC

## 2017-08-23 MED ORDER — LABETALOL HCL 5 MG/ML IV SOLN
10.0000 mg | INTRAVENOUS | Status: DC | PRN
  Administered 2017-08-23 – 2017-08-24 (×3): 10 mg via INTRAVENOUS
  Filled 2017-08-23 (×3): qty 4

## 2017-08-23 MED ORDER — LORAZEPAM 2 MG/ML IJ SOLN
INTRAMUSCULAR | Status: AC
  Administered 2017-08-23: 1 mg
  Filled 2017-08-23: qty 1

## 2017-08-23 MED ORDER — MORPHINE SULFATE (PF) 2 MG/ML IV SOLN
2.0000 mg | Freq: Once | INTRAVENOUS | Status: AC
  Administered 2017-08-23: 2 mg via INTRAVENOUS
  Filled 2017-08-23: qty 1

## 2017-08-23 NOTE — Progress Notes (Signed)
Paged MD Emokpae d/t pt sustained tachycardia ranging from 130's-160's bmp & pt moaning, concerns that pt is exoeriencing pain. Pt's BP 180/88 with a repeat BP of 162/98, will administer PRN 5mg  Metoprolol & continue to monitor pt while awaiting orders from MD.

## 2017-08-23 NOTE — Progress Notes (Addendum)
PROGRESS NOTE    [name] [name]  WFU:[id number] DOB: [birth year] DOA: 08/17/2017 PCP: [name], MD   Brief Narrative65 year old female with history of polymyositis, CVA, gastric leiomyoma, hypertension, GERD, diastolic CHF was brought to the emergency department with complaints of bright red blood per rectum.  Patient is from a facility . She had episodes of hypotension and unresponsiveness in the emergency department and she required intubation .GI was also consulted.  She underwent 2 unit of PRBC transfusion.  Chest x-ray post intubation showed left pneumothorax.  She was extubated on 8/8. Patient transferred to hospitalist service.   Assessment & Plan:   Active Problems:   Polymyositis (HCC)   Adrenal insufficiency (HCC)   Acute blood loss anemia   CVA (cerebral vascular accident) ([place])   Acute upper GI bleed   Nontraumatic hemorrhagic shock ([place])  GI bleed/hematochezia/suspected hemorrhagic shock: Status post EGD-negative. currently H&H stable.  Status post  2U PRBC. CT abdomen/pelvis did not show any acute bleed.  - GI recs 8/8 - hold Aspirin, Plavix.  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  No further endoscopic work-up planned.  -Palliative care consulted  Acute respiratory failure: She had to be intubated for airway protection.  Extubated on 8/8.   Likely secondary to aspiration pneumonia , luminary edema , bilateral pleural effusion.  -O2 sats greater than 97% on room air.  Pulmonary edema/Bilateral pleural effusion/history of diastolic heart failure:  Home medications Lasix 40 daily 3 times a week. Repeat chest xray 8/10 improved.  On room air -Initially on Lasix now held, patient n.p.o -Low-dose IV fluids D5  Acute encephalopathy: Likely 2/2 hypoperfusion, aspiration pneumonia.  Hx of CVA. Head CT-mild diffuse cerebral and cerebellar atrophy , marked chronic white matter ischemic change. Patient's son tells me at baseline prior to  admission patient was able to hold a conversation, with no memory deficits or dementia.  Patient lives with her son who is blind, appears to be on denial about patients baseline. Patient is chronically bed bound.  -Agitated today with tachycardia and hypertension, likely from pain, Ativan 1 mg given and morphine as needed -Unsuccessful NG tube placement x3.  After third attempt mild bleeding noted in the nostrils and on NG tube. -Hold off on NG tube today -Palliative care consult, patient may need PEG tube.  Hypertension:  Elevated.  Home atenolol held -IV labetalol as needed.  Aspiration pneumonia: On Unasyn.  Cultures negative so far.  Chest x-ray- 8/10-improved atelectasis/consolidation.   WBC 15 >>8.8. On chronic steroids.  -PT recommending n.p.o., successful NG tube attempt with bleeding  -May need PEG tube  Left-sided pneumothorax: chest tube removed.  Syncope: Most likely vasovagal secondary to hemorrhagic shock.  2 episodes of unresponsiveness in the emergency department.  Patient was admitted under ICU service.  She was intubated.  Currently extubated.  Polymyositis with history of chronic adrenal insufficiency: On CellCept and prednisone. Follows with rheumatologist with Dr. Amil Amen. Currently patient is n.p.o.  She has a port  on her right chest for blood draws because she is hard stick.  History history of CVA: On aspirin and Plavix.  Both on hold for now, while n.p.o.  Hypertension: On atenolol at home.  Currently on hold .  Will resume on discharge.  Hyperlipidemia: On Lovaza at home.  Hypokalemia: Supplemented.  Debility/deconditioning: Due to CVA, Polymyositis. Bed bound since last several years.Has episodes of confusion. 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 PT following.  Recommending skilled nursing facility on discharge.  Social worker following.  Nutrition failed swallow evaluation again.will replace ngt for tube feeds. DVT prophylaxis:  scd Code Status: dnr Family Communication: Discussed with options for enteral feeding.  Recommended palliative care consult. Family considering PEG tube placement.   Disposition Plan: tbd  Consultants:   pccm gi  Procedures egd Antimicrobials  unasyn 8/7 >>  Subjective: Patient today is still restless, non verbal tachycardic , and hypertensive.  Objective: Vitals:   08/23/17 1345 08/23/17 1515 08/23/17 1518 08/23/17 1545  BP: (!) 112/45 (!) 160/93  (!) 180/96  Pulse: (!) 127 (!) 135  (!) 134  Resp: (!) 22 (!) 23  (!) 31  Temp:   98.5 F (36.9 C)   TempSrc:   Axillary   SpO2: 94% 96%  95%  Weight:      Height:        Intake/Output Summary (Last 24 hours) at 08/23/2017 1732 Last data filed at 08/23/2017 1100 Gross per 24 hour  Intake 883.15 ml  Output 415 ml  Net 468.15 ml   Filed Weights   08/21/17 0313 08/22/17 0500 08/23/17 0400  Weight: 69.8 kg 66.4 kg 67.2 kg    Examination:chonically ill /sickappearing   General exam: Agitated, not responding verbally, restless, improved with 1 mg Ativan,  Respiratory system: Clear to auscultation. Respiratory effort normal. Cardiovascular system: S1 & S2 heard, RRR. No JVD, murmurs, rubs, gallops or clicks. No pedal edema. Gastrointestinal system: Abdomen is nondistended, soft and nontender. No organomegaly or masses felt. Normal bowel sounds heard. Extremities: Notes following directions, not moving lower extremities, contractures lower extremities. Skin: No rashes, lesions or ulcers  Data Reviewed: I have personally reviewed following labs and imaging studies  CBC: Recent Labs  Lab 08/19/17 0500 08/20/17 0428 08/21/17 0436 08/22/17 0425 08/23/17 0415  WBC 13.1* 15.6* 14.5* 10.2 8.8  NEUTROABS  --  13.9*  --  8.0* 7.0  HGB 8.5* 9.0* 10.1* 10.3* 10.1*  HCT 25.5* 27.4* 30.7* 31.8* 31.2*  MCV 81.2 80.8 80.4 80.7 81.3  PLT 200 212 202 221 357   Basic Metabolic Panel: Recent Labs  Lab 08/18/17 1129 08/18/17 1712  08/19/17 0500 08/20/17 0428 08/21/17 0436 08/21/17 1603 08/22/17 0425 08/23/17 0415  NA  --   --  145 145 143 142 140 139  K  --   --  2.7* 3.3* 3.3* 4.0 3.6 3.3*  CL  --   --  116* 116* 107 103 99 97*  CO2  --   --  20* 20* 26 28 28 29   GLUCOSE  --   --  166* 136* 79 103* 70 90  BUN  --   --  19 15 9 10 10 10   CREATININE  --   --  0.49 0.38* 0.34* 0.45 0.48 0.50  CALCIUM  --   --  7.6* 7.9* 8.5* 8.8* 9.0 8.8*  MG 1.8 1.9 2.0 1.8 1.7  --   --   --   PHOS 3.0 2.7 2.3* 1.3* 2.5  --   --   --    Liver Function Tests: Recent Labs  Lab 08/17/17 0638  AST 16  ALT 18  ALKPHOS 43  BILITOT 0.8  PROT 5.7*  ALBUMIN 3.2*   Coagulation Profile: Recent Labs  Lab 08/17/17 0638 08/18/17 0416  INR 0.94 1.13   CBG: Recent Labs  Lab 08/22/17 2315 08/23/17 0349 08/23/17 0735 08/23/17 1147 08/23/17 1517  GLUCAP 128* 86 81 116* 126*   Sepsis Labs: Recent Labs  Lab 08/17/17 2030  08/18/17 0113 08/18/17 0530 08/18/17 1042  PROCALCITON 0.27  --   --   --   LATICACIDVEN 2.8* 3.0* 2.1* 2.1*    Recent Results (from the past 240 hour(s))  MRSA PCR Screening     Status: None   Collection Time: 08/17/17  1:17 PM  Result Value Ref Range Status   MRSA by PCR NEGATIVE NEGATIVE Final    Comment:        The GeneXpert MRSA Assay (FDA approved for NASAL specimens only), is one component of a comprehensive MRSA colonization surveillance program. It is not intended to diagnose MRSA infection nor to guide or monitor treatment for MRSA infections. Performed at [facility], [place] [address], [place], [place] [address]   Culture, respiratory (non-expectorated)     Status: None   Collection Time: 08/17/17  8:30 PM  Result Value Ref Range Status   Specimen Description TRACHEAL ASPIRATE  Final   Special Requests NONE  Final   Gram Stain   Final    ABUNDANT WBC PRESENT, PREDOMINANTLY PMN ABUNDANT GRAM POSITIVE COCCI RARE GRAM POSITIVE RODS    Culture   Final    Consistent  with normal respiratory flora. Performed at [facility], [place] [address], [place], [place] [address]    Report Status 08/20/2017 FINAL  Final  Culture, blood (routine x 2)     Status: None   Collection Time: 08/17/17  9:33 PM  Result Value Ref Range Status   Specimen Description BLOOD RIGHT HAND  Final   Special Requests   Final    BOTTLES DRAWN AEROBIC ONLY Blood Culture results may not be optimal due to an inadequate volume of blood received in culture bottles   Culture   Final    NO GROWTH 5 DAYS Performed at [facility], [place] [address], [place], [place] [address]    Report Status 08/22/2017 FINAL  Final  Body fluid culture     Status: None   Collection Time: 08/18/17 10:44 AM  Result Value Ref Range Status   Specimen Description FLUID PLEURAL LEFT  Final   Special Requests Immunocompromised  Final   Gram Stain   Final    ABUNDANT WBC PRESENT, PREDOMINANTLY PMN NO ORGANISMS SEEN    Culture   Final    NO GROWTH 3 DAYS Performed at [facility], [place] [address] [place], [place] [address]    Report Status 08/21/2017 FINAL  Final  Culture, respiratory     Status: None   Collection Time: 08/18/17 11:29 AM  Result Value Ref Range Status   Specimen Description TRACHEAL ASPIRATE  Final   Special Requests NONE  Final   Gram Stain   Final    FEW WBC PRESENT, PREDOMINANTLY PMN RARE GRAM POSITIVE COCCI    Culture   Final    Consistent with normal respiratory flora. Performed at [facility], [place] [address], [place], [place] [address]    Report Status 08/20/2017 FINAL  Final     Radiology Studies: Dg Chest 1 View  Result Date: 08/22/2017 CLINICAL DATA:  Hypoxia, altered mental status EXAM: CHEST  1 VIEW COMPARISON:  the previous day's study FINDINGS: The left pigtail drain catheter is been removed. No pneumothorax evident. Blunting of the left lateral costophrenic angle suggesting small effusion. Stable right IJ port catheter. Atelectasis/infiltrate in  the lung bases left greater than right, improved since prior study. Heart size upper limits normal for technique. Aortic Atherosclerosis (ICD10-170.0). Regional  bones unremarkable. IMPRESSION: 1. Interval removal of left chest tube with no pneumothorax. 2. Possible small left pleural effusion. 3. Bibasilar atelectasis/consolidation, improved since previous exam Electronically Signed   By: Lucrezia Europe M.D.   On: 08/22/2017 10:04   Dg Abd Portable 1v  Result Date: 08/23/2017 CLINICAL DATA:  Nasogastric tube placement. EXAM: PORTABLE ABDOMEN - 1 VIEW COMPARISON:  08/18/2017. FINDINGS: Normal bowel gas pattern. No nasogastric tube seen. Mild scoliosis and lower lumbar spine fixation hardware. IMPRESSION: No nasogastric tube visualized. Electronically Signed   By: Claudie Revering M.D.   On: 08/23/2017 11:19    Scheduled Meds: . atenolol  50 mg Oral BID  . chlorhexidine  15 mL Mouth Rinse BID  . mouth rinse  15 mL Mouth Rinse q12n4p  . methylPREDNISolone (SOLU-MEDROL) injection  40 mg Intravenous Daily  . pantoprazole (PROTONIX) IV  40 mg Intravenous Q24H  . sodium chloride flush  3 mL Intravenous Q12H   Continuous Infusions: . sodium chloride 10 mL/hr at 08/19/17 0242  . ampicillin-sulbactam (UNASYN) IV 3 g (08/23/17 1358)  . dextrose Stopped (08/23/17 1021)  . mycophenolate (CELLCEPT) IV 85 mL/hr at 08/23/17 1100     LOS: 6 days     Storey Stangeland Arlyce Dice, MD [facility]  If 7PM-7AM, please contact night-coverage [email or web address] Password [facility] 08/23/2017, 5:32 PM

## 2017-08-23 NOTE — Plan of Care (Signed)

## 2017-08-23 NOTE — Progress Notes (Signed)
This RN & Glo Herring., RN both attempted NG tube placement with no success. Patient moaning, grimacing & moving head back & forth, unable to follow commands, paged Emokpae, MD to come assess patient at bedside d/t pts increased agitation, increased BP & HR. Verbal order from MD for 1mg  Ativan given to pt. Family currently at bedside, updated by MD. Palliative consult placed by MD in order for family to discuss further options for nutrition.   Pt's son is adamant about being here at bedside for any future NG placement attempts & being here to speak to palliative. Pt's son must arrange for transportation at least one day before needed so he is planning on being at the bedside tomorrow 8/12 by 10 am in hopes of speaking with palliative team. Will page palliative in order to update them on family situation.

## 2017-08-24 DIAGNOSIS — Z7189 Other specified counseling: Secondary | ICD-10-CM

## 2017-08-24 DIAGNOSIS — Z8709 Personal history of other diseases of the respiratory system: Secondary | ICD-10-CM

## 2017-08-24 DIAGNOSIS — Z515 Encounter for palliative care: Secondary | ICD-10-CM

## 2017-08-24 LAB — GLUCOSE, CAPILLARY
GLUCOSE-CAPILLARY: 73 mg/dL (ref 70–99)
GLUCOSE-CAPILLARY: 75 mg/dL (ref 70–99)
GLUCOSE-CAPILLARY: 93 mg/dL (ref 70–99)
Glucose-Capillary: 115 mg/dL — ABNORMAL HIGH (ref 70–99)
Glucose-Capillary: 134 mg/dL — ABNORMAL HIGH (ref 70–99)
Glucose-Capillary: 138 mg/dL — ABNORMAL HIGH (ref 70–99)
Glucose-Capillary: 76 mg/dL (ref 70–99)

## 2017-08-24 LAB — BASIC METABOLIC PANEL
Anion gap: 13 (ref 5–15)
BUN: 8 mg/dL (ref 8–23)
CALCIUM: 8.4 mg/dL — AB (ref 8.9–10.3)
CHLORIDE: 101 mmol/L (ref 98–111)
CO2: 28 mmol/L (ref 22–32)
CREATININE: 0.34 mg/dL — AB (ref 0.44–1.00)
Glucose, Bld: 78 mg/dL (ref 70–99)
Potassium: 2.8 mmol/L — ABNORMAL LOW (ref 3.5–5.1)
SODIUM: 142 mmol/L (ref 135–145)

## 2017-08-24 LAB — CBC
HCT: 28.5 % — ABNORMAL LOW (ref 36.0–46.0)
Hemoglobin: 9.2 g/dL — ABNORMAL LOW (ref 12.0–15.0)
MCH: 26.2 pg (ref 26.0–34.0)
MCHC: 32.3 g/dL (ref 30.0–36.0)
MCV: 81.2 fL (ref 78.0–100.0)
PLATELETS: 217 10*3/uL (ref 150–400)
RBC: 3.51 MIL/uL — AB (ref 3.87–5.11)
RDW: 18.1 % — ABNORMAL HIGH (ref 11.5–15.5)
WBC: 8.5 10*3/uL (ref 4.0–10.5)

## 2017-08-24 MED ORDER — HYDRALAZINE HCL 10 MG PO TABS
10.0000 mg | ORAL_TABLET | Freq: Four times a day (QID) | ORAL | Status: DC
  Administered 2017-08-24 – 2017-08-25 (×4): 10 mg
  Filled 2017-08-24 (×4): qty 1

## 2017-08-24 MED ORDER — CHLORHEXIDINE GLUCONATE CLOTH 2 % EX PADS
6.0000 | MEDICATED_PAD | Freq: Every day | CUTANEOUS | Status: DC
  Administered 2017-08-26 – 2017-09-09 (×15): 6 via TOPICAL

## 2017-08-24 MED ORDER — VITAL HIGH PROTEIN PO LIQD
1000.0000 mL | ORAL | Status: DC
Start: 2017-08-24 — End: 2017-08-24

## 2017-08-24 MED ORDER — SODIUM CHLORIDE 0.9% FLUSH
10.0000 mL | INTRAVENOUS | Status: DC | PRN
  Administered 2017-08-26: 40 mL
  Filled 2017-08-24: qty 40

## 2017-08-24 MED ORDER — METOPROLOL TARTRATE 5 MG/5ML IV SOLN
10.0000 mg | Freq: Once | INTRAVENOUS | Status: AC
  Administered 2017-08-24: 10 mg via INTRAVENOUS
  Filled 2017-08-24: qty 10

## 2017-08-24 MED ORDER — METOPROLOL TARTRATE 5 MG/5ML IV SOLN
5.0000 mg | Freq: Four times a day (QID) | INTRAVENOUS | Status: DC
  Administered 2017-08-24 – 2017-08-25 (×3): 5 mg via INTRAVENOUS
  Filled 2017-08-24 (×3): qty 5

## 2017-08-24 MED ORDER — POTASSIUM CHLORIDE 10 MEQ/100ML IV SOLN
10.0000 meq | INTRAVENOUS | Status: AC
  Administered 2017-08-24 (×5): 10 meq via INTRAVENOUS
  Filled 2017-08-24 (×4): qty 100

## 2017-08-24 MED ORDER — OSMOLITE 1.2 CAL PO LIQD
1000.0000 mL | ORAL | Status: DC
  Administered 2017-08-24 – 2017-08-31 (×8): 1000 mL
  Filled 2017-08-24 (×15): qty 1000

## 2017-08-24 MED ORDER — HYDRALAZINE HCL 20 MG/ML IJ SOLN
10.0000 mg | INTRAMUSCULAR | Status: DC | PRN
  Administered 2017-08-24 – 2017-08-31 (×2): 10 mg via INTRAVENOUS
  Filled 2017-08-24 (×2): qty 1

## 2017-08-24 MED ORDER — PRO-STAT SUGAR FREE PO LIQD
30.0000 mL | Freq: Every day | ORAL | Status: DC
  Administered 2017-08-25 – 2017-08-31 (×7): 30 mL
  Filled 2017-08-24 (×8): qty 30

## 2017-08-24 MED ORDER — POTASSIUM CHLORIDE 10 MEQ/100ML IV SOLN
INTRAVENOUS | Status: AC
  Administered 2017-08-24: 10 meq via INTRAVENOUS
  Filled 2017-08-24: qty 100

## 2017-08-24 MED ORDER — ATENOLOL 50 MG PO TABS
50.0000 mg | ORAL_TABLET | Freq: Every day | ORAL | Status: DC
  Administered 2017-08-25: 50 mg
  Filled 2017-08-24: qty 1

## 2017-08-24 MED ORDER — ACETAMINOPHEN 160 MG/5ML PO SOLN
650.0000 mg | Freq: Four times a day (QID) | ORAL | Status: DC | PRN
  Administered 2017-09-05 – 2017-09-08 (×6): 650 mg
  Filled 2017-08-24 (×6): qty 20.3

## 2017-08-24 MED ORDER — MORPHINE SULFATE (PF) 2 MG/ML IV SOLN
2.0000 mg | INTRAVENOUS | Status: DC | PRN
  Administered 2017-08-24 – 2017-08-28 (×10): 2 mg via INTRAVENOUS
  Filled 2017-08-24 (×10): qty 1

## 2017-08-24 NOTE — Progress Notes (Signed)
[facility] TEAM 1 - Stepdown/ICU TEAM  [name] [name]  HEN:[id number] DOB: 06/02/1940 DOA: 08/17/2017 PCP: [name], MD    Brief Narrative:  77yo female SNF resident with a hx of polymyositis, CVA, gastric leiomyoma, hypertension, GERD, and diastolic CHF who was brought to the ED w/ bright red blood per rectum. She had episodes of hypotension and unresponsiveness in the ED and required intubation. She was transfused 2 unit of PRBC transfusion. Chest x-ray post intubation showed left pneumothorax. She was extubated on 8/8.  Significant Events: 8/5 admit - intubated in ED - L sided chest tube placed  8/8 extubated   Subjective: The pt is awake but not interactive.  She is moaning intermittently, but dose not appear to be in pain or respiratory distress.    I have spoke w/ her family at the bedside at great length.    Assessment & Plan:  Hematochezia - Hemorrhagic shock EGD negative - H&H stable - s/p 2U PRBC - CT abdom/pelvis w/o acute findings - cont to hold Aspirin, Plavix - diverticular v/s recurrent colonic AVM - no further endoscopic work-up planned Recent Labs  Lab 08/20/17 0428 08/21/17 0436 08/22/17 0425 08/23/17 0415 08/24/17 0500  HGB 9.0* 10.1* 10.3* 10.1* 9.2*    Acute hypoxic respiratory failure - Pulmonary edema / B pleural effusions - Aspiration pneumonia Extubated on 8/8 - likely secondary to aspiration pneumonia, pulmonary edema, & B pleural effusions   Dysphagia Has failed numerous swallow studies - family agrees to have Cortrak placed today after 3 failed attempts at NG placement yesterday   Acute encephalopathy 2/2 hypoperfusion, aspiration pneumonia, + Hx of CVA - head CT noted mild diffuse cerebral and cerebellar atrophy, marked chronic white matter ischemic change - at baseline patient was able to hold a conversation, with no memory deficits or dementia per son - check B12 and folate - avoid sedatives  Hypertension BP very poorly controlled -  adjust tx and follow  Left-sided pneumothorax chest tube removed - resolved - felt to be related to intubation/positive pressure ventilation   Syncope secondary to hemorrhagic shock  Polymyositis - Chronic adrenal insufficiency OnCellCept and prednisone - follows with Rheumatologist (Dr. Amil Amen)  History of CVA On aspirin and Plavix chronically but both on hold due to bleeding - family aware this increases her risk of CVA  Hyperlipidemia  Hypokalemia Supplement to goal of 4 - check Mg   Debility/deconditioning Due to CVA + Polymyositis - bed bound for several years w/ contractures on bilateral lower extremities - will need SNF   DVT prophylaxis: SCDs Code Status: DNR - NO CODE Family Communication:   Disposition Plan: SDU  Consultants:  PCCM GI  Antimicrobials:  Unasyn 8/7 >  Objective: Blood pressure (!) 166/92, pulse 97, temperature (!) 97.4 F (36.3 C), temperature source Oral, resp. rate (!) 22, height 5\' 2"  (1.575 m), weight 68.3 kg, SpO2 96 %.  Intake/Output Summary (Last 24 hours) at 08/24/2017 1101 Last data filed at 08/24/2017 0600 Gross per 24 hour  Intake 1308.75 ml  Output 1110 ml  Net 198.75 ml   Filed Weights   08/22/17 0500 08/23/17 0400 08/24/17 0352  Weight: 66.4 kg 67.2 kg 68.3 kg    Examination: General: No acute respiratory distress - altered  Lungs: bibasilar crackles  Cardiovascular: Regular rate and rhythm without murmur gallop or rub normal S1 and S2 Abdomen: Nontender, nondistended, soft, bowel sounds positive, no rebound, no ascites, no appreciable mass Extremities: No significant cyanosis, clubbing, or edema bilateral lower  extremities  CBC: Recent Labs  Lab 08/20/17 0428  08/22/17 0425 08/23/17 0415 08/24/17 0500  WBC 15.6*   < > 10.2 8.8 8.5  NEUTROABS 13.9*  --  8.0* 7.0  --   HGB 9.0*   < > 10.3* 10.1* 9.2*  HCT 27.4*   < > 31.8* 31.2* 28.5*  MCV 80.8   < > 80.7 81.3 81.2  PLT 212   < > 221 248 217   < > =  values in this interval not displayed.   Basic Metabolic Panel: Recent Labs  Lab 08/19/17 0500 08/20/17 0428 08/21/17 0436  08/22/17 0425 08/23/17 0415 08/24/17 0500  NA 145 145 143   < > 140 139 142  K 2.7* 3.3* 3.3*   < > 3.6 3.3* 2.8*  CL 116* 116* 107   < > 99 97* 101  CO2 20* 20* 26   < > 28 29 28   GLUCOSE 166* 136* 79   < > 70 90 78  BUN 19 15 9    < > 10 10 8   CREATININE 0.49 0.38* 0.34*   < > 0.48 0.50 0.34*  CALCIUM 7.6* 7.9* 8.5*   < > 9.0 8.8* 8.4*  MG 2.0 1.8 1.7  --   --   --   --   PHOS 2.3* 1.3* 2.5  --   --   --   --    < > = values in this interval not displayed.   GFR: Estimated Creatinine Clearance: 53.4 mL/min (A) (by C-G formula based on SCr of 0.34 mg/dL (L)).  Liver Function Tests: No results for input(s): AST, ALT, ALKPHOS, BILITOT, PROT, ALBUMIN in the last 168 hours. No results for input(s): LIPASE, AMYLASE in the last 168 hours. No results for input(s): AMMONIA in the last 168 hours.  Coagulation Profile: Recent Labs  Lab 08/18/17 0416  INR 1.13    Recent Results (from the past 240 hour(s))  MRSA PCR Screening     Status: None   Collection Time: 08/17/17  1:17 PM  Result Value Ref Range Status   MRSA by PCR NEGATIVE NEGATIVE Final    Comment:        The GeneXpert MRSA Assay (FDA approved for NASAL specimens only), is one component of a comprehensive MRSA colonization surveillance program. It is not intended to diagnose MRSA infection nor to guide or monitor treatment for MRSA infections. Performed at [facility], [place] [address], [place], [place] [address]   Culture, respiratory (non-expectorated)     Status: None   Collection Time: 08/17/17  8:30 PM  Result Value Ref Range Status   Specimen Description TRACHEAL ASPIRATE  Final   Special Requests NONE  Final   Gram Stain   Final    ABUNDANT WBC PRESENT, PREDOMINANTLY PMN ABUNDANT GRAM POSITIVE COCCI RARE GRAM POSITIVE RODS    Culture   Final    Consistent with normal  respiratory flora. Performed at [facility], [place] [address] [place], [place] [address]    Report Status 08/20/2017 FINAL  Final  Culture, blood (routine x 2)     Status: None   Collection Time: 08/17/17  9:33 PM  Result Value Ref Range Status   Specimen Description BLOOD RIGHT HAND  Final   Special Requests   Final    BOTTLES DRAWN AEROBIC ONLY Blood Culture results may not be optimal due to an inadequate volume of blood received in culture bottles   Culture   Final  NO GROWTH 5 DAYS Performed at [facility], [place] [address], [place], [place] [address]    Report Status 08/22/2017 FINAL  Final  Body fluid culture     Status: None   Collection Time: 08/18/17 10:44 AM  Result Value Ref Range Status   Specimen Description FLUID PLEURAL LEFT  Final   Special Requests Immunocompromised  Final   Gram Stain   Final    ABUNDANT WBC PRESENT, PREDOMINANTLY PMN NO ORGANISMS SEEN    Culture   Final    NO GROWTH 3 DAYS Performed at [facility], [place] [address] [place], [place] [address]    Report Status 08/21/2017 FINAL  Final  Culture, respiratory     Status: None   Collection Time: 08/18/17 11:29 AM  Result Value Ref Range Status   Specimen Description TRACHEAL ASPIRATE  Final   Special Requests NONE  Final   Gram Stain   Final    FEW WBC PRESENT, PREDOMINANTLY PMN RARE GRAM POSITIVE COCCI    Culture   Final    Consistent with normal respiratory flora. Performed at [facility], [place] [address], [place], [place] [address]    Report Status 08/20/2017 FINAL  Final     Scheduled Meds: . atenolol  50 mg Oral BID  . chlorhexidine  15 mL Mouth Rinse BID  . mouth rinse  15 mL Mouth Rinse q12n4p  . methylPREDNISolone (SOLU-MEDROL) injection  40 mg Intravenous Daily  . pantoprazole (PROTONIX) IV  40 mg Intravenous Q24H  . sodium chloride flush  3 mL Intravenous Q12H   Continuous Infusions: . sodium chloride 10 mL/hr at 08/19/17 0242  .  ampicillin-sulbactam (UNASYN) IV 3 g (08/24/17 0530)  . dextrose 25 mL/hr at 08/23/17 1822  . mycophenolate (CELLCEPT) IV 1,000 mg (08/24/17 1056)     LOS: 7 days   Cherene Altes, MD [facility] Office  [phone] Pager - Text Page per Amion  If 7PM-7AM, please contact night-coverage per Amion 08/24/2017, 11:01 AM

## 2017-08-24 NOTE — Progress Notes (Signed)
  Speech Language Pathology Treatment: Dysphagia  Patient Details Name: [name] [name] MRN: [id number] DOB: Jul 07, 1940 Today's Date: 08/24/2017 Time: 3212-2482 SLP Time Calculation (min) (ACUTE ONLY): 17 min  Assessment / Plan / Recommendation Clinical Impression  Pt has minimal labial/lingual response to thin liquids via swab. I did not see her initiate a swallow response, although her daughter thought that she may have initiated one across all trials. She turns her head away from ice chips offered, not taking any off the spoon. It is difficult to determine how much of her poor acceptance is cognitive in nature, as some of it appears to be intentionally turning away. Education was provided to her daughter about current presentation, goals of PO trials/therapy, and recommendation to remain NPO at this time.   HPI HPI: Pt is a 58yoF with hx Polymyositis, CVA, Gastric leiomyoma, HTN, GERD, and dCHF, who presented to ER with BRBPR, hypotension, and AMS. Difficult intubation 8/5, extubated 8/8. EGD showed no signs of bleeding. CXR post-intubation showed L PTX. CT Head negative. BSE 06/13/17 recommended Dys 3 diet and thin liquids, advanced to regular textures prior to d/c. MBS in May 2019 showed an overall functional swallow with a single incident of flash penetration. Pt was noted to cough during MBS but not in the setting of airway compromise. There was also concern for a prominent CP and a possible Zencker's diverticulum developing, but no backflow was observed. Dys 3 diet and thin liquids were recommended at that time.      SLP Plan  Continue with current plan of care       Recommendations  Diet recommendations: NPO Medication Administration: Via alternative means                Oral Care Recommendations: Oral care QID Follow up Recommendations: (tba) SLP Visit Diagnosis: Dysphagia, unspecified (R13.10) Plan: Continue with current plan of care       GO                 Germain Osgood 08/24/2017, 4:51 PM  Germain Osgood, M.A. CCC-SLP [phone]

## 2017-08-24 NOTE — Progress Notes (Signed)
Physical Therapy Treatment Patient Details Name: [name] [name] MRN: [id number] DOB: April 07, 1940 Today's Date: 08/24/2017    History of Present Illness 77 y.o. female admitted on 08/17/17 for rectal bleeding.  Pt dx with GIB with hematochezia (s/p EGD with no signs of UGIB s/p 2 units PRBCs), syncope related to possible hemorrhagic shock vs PTX vs sepsis requiring intubation 08/17/17-08/20/17.  PTX treated with L chest tube 8/5-08/21/17.  Pt with other significant PMH of polymyositis, CVA, chronic diastolic heart failure, bil RTC repair, and lumbar laminectomy.    PT Comments    Pt was able to sit EOB today (BPs better controlled with meds) with VSS.  She seemed to indicate that she did not want to attempt standing was able to move bil LEs with a seated kick L stronger than R, but bil knees with flexion contractures (~15 degrees) coinciding with the information that she may spend a good bit of time in a WC at SNF.  I think she would be strong enough to try an assisted stand, or squat to get OOB to chair next session if able.  PT will continue to follow along and progress safe mobility as able.   Follow Up Recommendations  SNF     Equipment Recommendations  Hospital bed;Wheelchair cushion (measurements PT);Wheelchair (measurements PT);Other (comment)(hoyer lift)    Recommendations for Other Services   NA     Precautions / Restrictions Precautions Precautions: Fall Precaution Comments: right side weaker than left    Mobility  Bed Mobility Overal bed mobility: Needs Assistance Bed Mobility: Supine to Sit;Sit to Supine     Supine to sit: +2 for physical assistance;Max assist;HOB elevated Sit to supine: +2 for physical assistance;Max assist;HOB elevated   General bed mobility comments: Pt needed assist at trunk and legs to progress to EOB and back to supine.  Once motion initiated, seemingly follows (weakly)  Transfers                 General transfer comment: NT, pt seemed to  indicate that she did not want to stand and did not walk PTA         Balance Overall balance assessment: Needs assistance Sitting-balance support: Feet supported;Bilateral upper extremity supported Sitting balance-Leahy Scale: Poor Sitting balance - Comments: min assist EOB due to LOB mostly laterally                                    Cognition Arousal/Alertness: Awake/alert Behavior During Therapy: Flat affect Overall Cognitive Status: Impaired/Different from baseline Area of Impairment: Orientation;Attention;Memory;Safety/judgement;Following commands;Awareness;Problem solving                 Orientation Level: Disoriented to;Place;Time;Situation Current Attention Level: Sustained Memory: Decreased recall of precautions;Decreased short-term memory Following Commands: Follows one step commands consistently(on her left side) Safety/Judgement: Decreased awareness of safety;Decreased awareness of deficits Awareness: Intellectual Problem Solving: Slow processing;Decreased initiation;Difficulty sequencing;Requires tactile cues;Requires verbal cues General Comments: Pt less restless, does not appear to be in pain, attempts to speak when asked questions, nods with y/n, but no way to confirm accuracy of answers.  Following one step commands consistantly on her left side.       Exercises General Exercises - Lower Extremity Long Arc Quad: AROM;AAROM;Both;10 reps        Pertinent Vitals/Pain Pain Assessment: Faces Pain Score: 0-No pain           PT Goals (current goals can  now be found in the care plan section) Acute Rehab PT Goals Patient Stated Goal: Unable to state Progress towards PT goals: Progressing toward goals    Frequency    Min 2X/week      PT Plan Current plan remains appropriate       AM-PAC PT "6 Clicks" Daily Activity  Outcome Measure  Difficulty turning over in bed (including adjusting bedclothes, sheets and blankets)?:  Unable Difficulty moving from lying on back to sitting on the side of the bed? : Unable Difficulty sitting down on and standing up from a chair with arms (e.g., wheelchair, bedside commode, etc,.)?: Unable Help needed moving to and from a bed to chair (including a wheelchair)?: Total Help needed walking in hospital room?: Total Help needed climbing 3-5 steps with a railing? : Total 6 Click Score: 6    End of Session   Activity Tolerance: Patient limited by fatigue Patient left: in bed;with call bell/phone within reach;with bed alarm set Nurse Communication: Mobility status PT Visit Diagnosis: Muscle weakness (generalized) (M62.81);Difficulty in walking, not elsewhere classified (R26.2);Other symptoms and signs involving the nervous system (I62.703)     Time: 5009-3818 PT Time Calculation (min) (ACUTE ONLY): 16 min  Charges:  $Therapeutic Activity: 8-22 mins          Tyray Proch B. [place], [place], DPT [phone]             08/24/2017, 4:00 PM

## 2017-08-24 NOTE — Progress Notes (Signed)
Cortrak Tube Team Note:  Consult received to place a Cortrak feeding tube.   A 10 F Cortrak tube was placed in the right nare and secured with a nasal bridle at 74cm. Per the Cortrak monitor reading the tube tip is just inside the pylorus .   No x-ray is required. RN may begin using tube.    If the tube becomes dislodged please keep the tube and contact the Cortrak team at [email or web address] (password [email or web address]) for replacement.  If after hours and replacement cannot be delayed, place a NG tube and confirm placement with an abdominal x-ray.    Koleen Distance MS, RD, LDN Pager #- [phone] Office#- [phone] After Hours Pager: [phone]

## 2017-08-24 NOTE — Consult Note (Addendum)
Consultation Note Date: 08/24/2017   Patient Name: [name] [name]  DOB: 07/09/1940  MRN: [id number]  Age / Sex: 77 y.o., female  PCP: [name], MD Referring Physician: Cherene Altes, MD  Reason for Consultation: Establishing goals of care  HPI/Patient Profile: 77yo female SNF resident with a hx of polymyositis, CVA, gastric leiomyoma, hypertension, GERD, and diastolic CHF who was brought to the ED w/ bright red blood per rectum.  Clinical Assessment and Goals of Care: Patient is resting in bed. She does not move extremities. She is nonverbal. Met with children today, 2 boys and 2 girls. [name] [name] has been residing at home with her son who is blind.   She has polymyositis, and has had progressive weakness and decline. A year ago, she used an Clinical research associate. She loved to cook, and check on people. She enjoyed going out for breakfast. She was able to use her arms.  She had a stroke in June and was no longer able to use her arms. She could not sit up herself. She had rehab to attempt to improve, and could move her hand to her mouth to feed herself with assistance.   We discussed her diagnoses, prognosis, GOC, EOL wishes disposition and options.  The family feels the medical team is optimistic. We discussed quality of life. Daughter recognizes that her mother is declining. She states that for everything that is done, something else happens. She states her mother told her she would never want resuscitation, or any tubes; she would not want any of the things we are doing to her. Her sons feel she would want to keep fighting, and that we must have faith God will heal her. Her other daughter would like to see how things go with the coretrac and then make decisions from there.   Will continue to follow along.     SUMMARY OF RECOMMENDATIONS    Continue current care at this time. Will continue to  follow.    Code Status/Advance Care Planning:  DNR    Symptom Management:   Per primary team.   Palliative Prophylaxis:   Eye Care and Oral Care   Prognosis:   Poor long term.   Discharge Planning: To Be Determined      Primary Diagnoses: Present on Admission: . Acute blood loss anemia . (Resolved) Hematochezia . Adrenal insufficiency ([place]) . CVA (cerebral vascular accident) ([place]) . Polymyositis ([place]) . (Resolved) Syncope, vasovagal . (Resolved) GI bleed . Acute upper GI bleed   I have reviewed the medical record, interviewed the patient and family, and examined the patient. The following aspects are pertinent.  Past Medical History:  Diagnosis Date  . Allergic rhinitis, cause unspecified   . Benign neoplasm of colon   . Chronic diastolic CHF (congestive heart failure) ([place])   . CVA (cerebral vascular accident) ([place]) 05/2017  . Esophageal reflux   . Excessive daytime sleepiness   . 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 GI hemorrhage 2011   recurrent  .  Hyperlipidemia   . Osteoporosis   . Polymyositis ([place])   . Unspecified essential hypertension   . Vitamin D deficiency    Social History   Socioeconomic History  . Marital status: Divorced    Spouse name: Not on file  . Number of children: 5  . Years of education: 11th grade  . Highest education level: Not on file  Occupational History  . Occupation: retired   Scientific laboratory technician  . Financial resource strain: Not on file  . Food insecurity:    Worry: Not on file    Inability: Not on file  . Transportation needs:    Medical: Not on file    Non-medical: Not on file  Tobacco Use  . Smoking status: Never Smoker  . Smokeless tobacco: Former Systems developer    Types: Snuff  . Tobacco comment: 07/21/2017 "no snuff  since ~ 04/2017"  Substance and Sexual Activity  . Alcohol use: No  . Drug use: No  . Sexual activity: Not Currently  Lifestyle  . Physical activity:    Days per week: Not on file    Minutes  per session: Not on file  . Stress: Not on file  Relationships  . Social connections:    Talks on phone: Not on file    Gets together: Not on file    Attends religious service: Not on file    Active member of club or organization: Not on file    Attends meetings of clubs or organizations: Not on file    Relationship status: Not on file  Other Topics Concern  . Not on file  Social History Narrative   Lives at home with her son.   Right-handed.   1 cup caffeine per day.   Family History  Problem Relation Age of Onset  . Dementia Mother   . Hypertension Father   . Malignant hyperthermia Father   . Colon cancer Neg Hx    Scheduled Meds: . chlorhexidine  15 mL Mouth Rinse BID  . feeding supplement (PRO-STAT SUGAR FREE 64)  30 mL Per Tube Daily  . mouth rinse  15 mL Mouth Rinse q12n4p  . methylPREDNISolone (SOLU-MEDROL) injection  40 mg Intravenous Daily  . metoprolol tartrate  5 mg Intravenous Q6H  . pantoprazole (PROTONIX) IV  40 mg Intravenous Q24H  . sodium chloride flush  3 mL Intravenous Q12H   Continuous Infusions: . ampicillin-sulbactam (UNASYN) IV 3 g (08/24/17 1354)  . dextrose 10 mL/hr at 08/24/17 1400  . feeding supplement (OSMOLITE 1.2 CAL)    . mycophenolate (CELLCEPT) IV Stopped (08/24/17 1256)  . potassium chloride 10 mEq (08/24/17 1400)   PRN Meds:.acetaminophen (TYLENOL) oral liquid 160 mg/5 mL, hydrALAZINE, morphine injection, [DISCONTINUED] ondansetron **OR** ondansetron (ZOFRAN) IV Medications Prior to Admission:  Prior to Admission medications   Medication Sig Start Date End Date Taking? Authorizing Provider  acetaminophen (TYLENOL 8 HOUR) 650 MG CR tablet Take 1 tablet (650 mg total) by mouth every 8 (eight) hours as needed. Patient taking differently: Take 650 mg by mouth every 8 (eight) hours as needed for pain.  05/15/17  Yes Varney Biles, MD  allopurinol (ZYLOPRIM) 100 MG tablet Take 1 tablet (100 mg total) by mouth daily. 07/23/17 08/22/17 Yes Florencia Reasons, MD  ALPRAZolam Duanne Moron) 0.25 MG tablet Take 0.25 mg by mouth 2 (two) times daily as needed for anxiety (for 14 days.). 08/15/17 08/29/17 Yes [provider]  aspirin 81 MG chewable tablet Chew 1 tablet (81 mg total) by mouth  daily. 06/12/17  Yes Caren Griffins, MD  atenolol (TENORMIN) 50 MG tablet Take 50 mg by mouth daily.     Yes [provider]  Calcium Carbonate (CALCIUM 600 PO) Take 1,200 mg by mouth daily.   Yes [provider]  clopidogrel (PLAVIX) 75 MG tablet Take 75 mg by mouth daily.   Yes [provider]  ferrous sulfate 325 (65 FE) MG tablet Take 325 mg by mouth daily with breakfast.   Yes [provider]  fluticasone (FLONASE) 50 MCG/ACT nasal spray Place 1 spray into both nostrils daily as needed for allergies.  06/05/17  Yes [provider]  folic acid (FOLVITE) 539 MCG tablet Take 400 mcg by mouth daily.     Yes [provider]  furosemide (LASIX) 40 MG tablet Take 1 tablet (40 mg total) by mouth every Monday, Wednesday, and Friday. 07/24/17  Yes Florencia Reasons, MD  gabapentin (NEURONTIN) 100 MG capsule TAKE ONE CAPSULE BY MOUTH TWICE DAILY 04/14/17  Yes Regal, Tamala Fothergill, DPM  mycophenolate (CELLCEPT) 250 MG capsule Take 4 capsules (1,000 mg total) by mouth 2 (two) times daily. 07/23/17  Yes Florencia Reasons, MD  omega-3 acid ethyl esters (LOVAZA) 1 g capsule Take 1 capsule (1 g total) by mouth 2 (two) times daily. 07/23/17  Yes Florencia Reasons, MD  oxyCODONE (OXY IR/ROXICODONE) 5 MG immediate release tablet Take 5 mg by mouth 2 (two) times daily as needed for severe pain.   Yes [provider]  pantoprazole (PROTONIX) 40 MG tablet Take 1 tablet (40 mg total) by mouth daily. Patient taking differently: Take 40 mg by mouth daily. Do not crush 06/25/17  Yes Love, Ivan Anchors, PA-C  potassium chloride SA (K-DUR,KLOR-CON) 20 MEQ tablet Take 2 tablets (40 mEq total) by mouth daily. 06/25/17  Yes Love, Ivan Anchors, PA-C  senna-docusate (SENOKOT-S)  8.6-50 MG tablet Take 2 tablets by mouth at bedtime as needed for mild constipation. 06/24/17  Yes Love, Ivan Anchors, PA-C  vitamin B-12 (CYANOCOBALAMIN) 1000 MCG tablet Take 1,000 mcg by mouth daily.     Yes [provider]  vitamin C (ASCORBIC ACID) 500 MG tablet Take 1,000 mg by mouth daily.    Yes [provider]   Allergies  Allergen Reactions  . Codeine Other (See Comments)    Feels drunk  . Hydrocodone     "makes me sleepy"  . Tramadol Hcl Other (See Comments)    Makes me feel drunk   Review of Systems  Unable to perform ROS   Physical Exam  Constitutional: No distress.  Pulmonary/Chest: Effort normal.  Neurological: She is alert.  Skin: Skin is warm and dry.    Vital Signs: BP (!) 183/106   Pulse 92   Temp (!) 97.4 F (36.3 C) (Oral)   Resp (!) 23   Ht 5' 2" (1.575 m)   Wt 68.3 kg   SpO2 96%   BMI 27.54 kg/m  Pain Scale: CPOT   Pain Score: 7    SpO2: SpO2: 96 % O2 Device:SpO2: 96 % O2 Flow Rate: .O2 Flow Rate (L/min): 3 L/min  IO: Intake/output summary:   Intake/Output Summary (Last 24 hours) at 08/24/2017 1438 Last data filed at 08/24/2017 1400 Gross per 24 hour  Intake 1617.35 ml  Output 1445 ml  Net 172.35 ml    LBM: Last BM Date: 08/22/17 Baseline Weight: Weight: 63.5 kg Most recent weight: Weight: 68.3 kg     Palliative Assessment/Data: NPO  Time In: 1:40 Time Out: 2:50 Time Total: 70 min Greater than 50%  of this time was spent counseling and coordinating care related to the above assessment and plan.  Signed by: Asencion Gowda, NP   Please contact Palliative Medicine Team phone at [phone] for questions and concerns.  For individual provider: See Shea Evans

## 2017-08-24 NOTE — Progress Notes (Signed)
Nutrition Follow-up  DOCUMENTATION CODES:   Not applicable  INTERVENTION:   Tube Feeding: Osmolite 1.2 @ 55 ml/hr Pro-Stat 30 mL daily Begin TF at rate of 20 ml/hr; if tolerating, titrate by 5 mL q 4 hours until goal rate of 55 ml/hr Provides 88 g of protein, 1684 kcals, 1069 mL of free water Meets 100% estimated calorie and protein needs  Monitor magnesium, potassium, and phosphorus daily for at least 3 days, MD to replete as needed, as pt is at risk for refeeding syndrome given prolonged NPO status, likely poor nutritional status PTA.   NUTRITION DIAGNOSIS:   Inadequate oral intake related to acute illness as evidenced by NPO status.  Continues  GOAL:   Patient will meet greater than or equal to 90% of their needs  Progressing  MONITOR:   TF tolerance, Vent status, Labs, Weight trends  REASON FOR ASSESSMENT:   Ventilator    ASSESSMENT:   77 yo female admitted with acute GI bleed, encephalopathy, pneumothorax s/p chest tube placement. Pt with hx of CVA, polymyositis, gastric leiomyoma, HTN, GERD, CHF. At baseline, pt is total care, wheelchair bound, chronic UE muscle weakness related to polymyositis. Pt is unable to feed self.   8/05 Admit, Intubated 8/08 Extubated 8/10 Palliative Care Consulted 8/11 NG tube placement attempted but unsuccessful  NPO/No Nutrition x 7 days SLP following but has not passed swallow eval, remains NPO Noted NG tube placement was attempted but unsuccessful  Net + 6 L since admission; weight up from admission wt of 63.5 kg. Current wt 68.3 kg. Utilizing 63.5 kg as EDW  Labs: potassium 2.8 (L) Meds: solumedrol, cellcept  Diet Order:   Diet Order    None      EDUCATION NEEDS:   Not appropriate for education at this time  Skin:  Skin Assessment: Skin Integrity Issues: Skin Integrity Issues:: Other (Comment) Other: MASD: groin, sacrum  Last BM:  8/5  Height:   Ht Readings from Last 1 Encounters:  08/17/17 5\' 2"  (1.575  m)    Weight:   Wt Readings from Last 1 Encounters:  08/24/17 68.3 kg    Ideal Body Weight:     BMI:  Body mass index is 27.54 kg/m.  Estimated Nutritional Needs:   Kcal:  1600-1800 kcals   Protein:  80-90 g  Fluid:  >/= 1.6 L   Kerman Passey MS, RD, LDN, CNSC [phone] Pager  [phone] Weekend/On-Call Pager

## 2017-08-25 LAB — CBC
HCT: 28.3 % — ABNORMAL LOW (ref 36.0–46.0)
Hemoglobin: 9.2 g/dL — ABNORMAL LOW (ref 12.0–15.0)
MCH: 26.6 pg (ref 26.0–34.0)
MCHC: 32.5 g/dL (ref 30.0–36.0)
MCV: 81.8 fL (ref 78.0–100.0)
PLATELETS: 232 10*3/uL (ref 150–400)
RBC: 3.46 MIL/uL — ABNORMAL LOW (ref 3.87–5.11)
RDW: 18.1 % — ABNORMAL HIGH (ref 11.5–15.5)
WBC: 9.4 10*3/uL (ref 4.0–10.5)

## 2017-08-25 LAB — GLUCOSE, CAPILLARY
GLUCOSE-CAPILLARY: 92 mg/dL (ref 70–99)
GLUCOSE-CAPILLARY: 164 mg/dL — AB (ref 70–99)
Glucose-Capillary: 126 mg/dL — ABNORMAL HIGH (ref 70–99)
Glucose-Capillary: 136 mg/dL — ABNORMAL HIGH (ref 70–99)
Glucose-Capillary: 200 mg/dL — ABNORMAL HIGH (ref 70–99)
Glucose-Capillary: 95 mg/dL (ref 70–99)

## 2017-08-25 LAB — VITAMIN B12: Vitamin B-12: 1377 pg/mL — ABNORMAL HIGH (ref 180–914)

## 2017-08-25 LAB — FOLATE: Folate: 25.4 ng/mL (ref >5.9)

## 2017-08-25 LAB — COMPREHENSIVE METABOLIC PANEL
ALK PHOS: 42 U/L (ref 38–126)
ALT: 25 U/L (ref 0–44)
AST: 28 U/L (ref 15–41)
Albumin: 2.5 g/dL — ABNORMAL LOW (ref 3.5–5.0)
Anion gap: 10 (ref 5–15)
BILIRUBIN TOTAL: 0.8 mg/dL (ref 0.3–1.2)
BUN: 5 mg/dL — ABNORMAL LOW (ref 8–23)
CO2: 28 mmol/L (ref 22–32)
CREATININE: 0.47 mg/dL (ref 0.44–1.00)
Calcium: 8.2 mg/dL — ABNORMAL LOW (ref 8.9–10.3)
Chloride: 100 mmol/L (ref 98–111)
GFR calc Af Amer: >60 mL/min (ref >60)
Glucose, Bld: 206 mg/dL — ABNORMAL HIGH (ref 70–99)
Potassium: 3 mmol/L — ABNORMAL LOW (ref 3.5–5.1)
SODIUM: 138 mmol/L (ref 135–145)
TOTAL PROTEIN: 4.6 g/dL — AB (ref 6.5–8.1)

## 2017-08-25 LAB — PHOSPHORUS: PHOSPHORUS: 2.4 mg/dL — AB (ref 2.5–4.6)

## 2017-08-25 LAB — MAGNESIUM: Magnesium: 1.7 mg/dL (ref 1.7–2.4)

## 2017-08-25 MED ORDER — PREDNISONE 5 MG/5ML PO SOLN
20.0000 mg | Freq: Two times a day (BID) | ORAL | Status: DC
  Administered 2017-08-25 – 2017-08-31 (×13): 20 mg
  Filled 2017-08-25 (×20): qty 20

## 2017-08-25 MED ORDER — METOPROLOL TARTRATE 25 MG/10 ML ORAL SUSPENSION
25.0000 mg | Freq: Three times a day (TID) | ORAL | Status: DC
  Administered 2017-08-25 – 2017-08-29 (×13): 25 mg
  Filled 2017-08-25 (×14): qty 10

## 2017-08-25 MED ORDER — MYCOPHENOLATE 200 MG/ML ORAL SUSPENSION
1000.0000 mg | Freq: Two times a day (BID) | ORAL | Status: DC
  Administered 2017-08-26 – 2017-09-06 (×19): 1000 mg
  Filled 2017-08-25 (×27): qty 20

## 2017-08-25 MED ORDER — SODIUM CHLORIDE 0.9 % IV SOLN
INTRAVENOUS | Status: DC
  Administered 2017-08-25 – 2017-08-28 (×2): via INTRAVENOUS

## 2017-08-25 MED ORDER — POTASSIUM CHLORIDE 20 MEQ/15ML (10%) PO SOLN
20.0000 meq | Freq: Three times a day (TID) | ORAL | Status: DC
  Administered 2017-08-25 – 2017-08-29 (×12): 20 meq
  Filled 2017-08-25 (×12): qty 15

## 2017-08-25 MED ORDER — ISOSORBIDE DINITRATE 10 MG PO TABS
5.0000 mg | ORAL_TABLET | Freq: Three times a day (TID) | ORAL | Status: DC
  Administered 2017-08-25 – 2017-08-29 (×12): 5 mg
  Filled 2017-08-25 (×12): qty 1

## 2017-08-25 MED ORDER — HYDRALAZINE HCL 25 MG PO TABS
25.0000 mg | ORAL_TABLET | Freq: Three times a day (TID) | ORAL | Status: DC
  Administered 2017-08-25 – 2017-08-29 (×11): 25 mg
  Filled 2017-08-25 (×12): qty 1

## 2017-08-25 MED ORDER — PANTOPRAZOLE SODIUM 40 MG PO PACK
40.0000 mg | PACK | Freq: Every day | ORAL | Status: DC
  Administered 2017-08-26 – 2017-09-09 (×13): 40 mg
  Filled 2017-08-25 (×14): qty 20

## 2017-08-25 MED ORDER — MAGNESIUM SULFATE 2 GM/50ML IV SOLN
2.0000 g | Freq: Once | INTRAVENOUS | Status: AC
  Administered 2017-08-25: 2 g via INTRAVENOUS
  Filled 2017-08-25: qty 50

## 2017-08-25 NOTE — Progress Notes (Signed)
[facility] TEAM 1 - Stepdown/ICU TEAM  [name] [name]  PPI:[id number] DOB: Mar 12, 1940 DOA: 08/17/2017 PCP: [name], MD    Brief Narrative:  77yo female SNF resident with a hx of polymyositis, CVA, gastric leiomyoma, hypertension, GERD, and diastolic CHF who was brought to the ED w/ bright red blood per rectum. She had episodes of hypotension and unresponsiveness in the ED and required intubation. She was transfused 2 unit of PRBC transfusion. Chest x-ray post intubation showed left pneumothorax. She was extubated on 8/8.  Significant Events: 8/5 admit - intubated in ED - L sided chest tube placed  8/8 extubated   Subjective: Perhaps she is somewhat more interactive today, but she is not able to clearly converse with me.  She does utilize some automatic type speech, but does not respond to more complex questions.  She is tachypneic and tachycardic, and appears mildly agitated.    Assessment & Plan:  Hematochezia - Hemorrhagic shock EGD negative - H&H stable s/p 2U PRBC - CT abdom/pelvis w/o acute findings - cont to hold Aspirin, Plavix - diverticular v/s recurrent colonic AVM - no further endoscopic work-up planned Recent Labs  Lab 08/21/17 0436 08/22/17 0425 08/23/17 0415 08/24/17 0500 08/25/17 0414  HGB 10.1* 10.3* 10.1* 9.2* 9.2*    Acute hypoxic respiratory failure - Pulmonary edema / B pleural effusions - Aspiration pneumonia Extubated on 8/8 - likely secondary to aspiration pneumonia, pulmonary edema, & B pleural effusions - resolved w/ pt now on RA - stop abx tx today   Dysphagia Has failed numerous swallow studies - family agreed to have Cortrak placed 08/24/17 after 3 failed attempts at NG placement 08/23/17 - tolerating Cortrak and tube feeding well thus far  Acute encephalopathy Likely due to hypoperfusion, aspiration pneumonia, + Hx of CVA - head CT noted mild diffuse cerebral and cerebellar atrophy, marked chronic white matter ischemic change - at baseline patient  was able to hold a conversation, with no memory deficits or dementia per son - B12 and folate not low - avoid sedatives  Hypertension BP very poorly controlled - adjust tx again today and follow  Left-sided pneumothorax chest tube removed - resolved - felt to be related to intubation/positive pressure ventilation   Syncope secondary to hemorrhagic shock  Polymyositis - Chronic adrenal insufficiency OnCellCept and prednisone - follows with Rheumatologist (Dr. 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 rate (!) 28, height 5\' 2"  (1.575 m), weight 65.5 kg, SpO2 95 %.  Intake/Output Summary (Last 24 hours) at 08/25/2017 1223 Last data filed at 08/25/2017 1000 Gross per 24 hour  Intake 3378.09 ml  Output 1100 ml  Net 2278.09 ml   Filed Weights   08/23/17 0400 08/24/17 0352 08/25/17 0416  Weight: 67.2 kg 68.3 kg 65.5 kg    Examination: General: No acute respiratory distress -  eyes open  Lungs: no signif crackles or wheezing B lung fields  Cardiovascular: trachycardic - no M or rub  Abdomen: NT/ND, soft, bs+, no mass, no rbound  Extremities: No signif edema bilateral lower extremities  CBC: Recent Labs  Lab 08/20/17 0428  08/22/17 0425 08/23/17 0415 08/24/17 0500 08/25/17 0414  WBC 15.6*   < > 10.2 8.8 8.5 9.4  NEUTROABS 13.9*  --  8.0* 7.0   --   --   HGB 9.0*   < > 10.3* 10.1* 9.2* 9.2*  HCT 27.4*   < > 31.8* 31.2* 28.5* 28.3*  MCV 80.8   < > 80.7 81.3 81.2 81.8  PLT 212   < > 221 248 217 232   < > = values in this interval not displayed.   Basic Metabolic Panel: Recent Labs  Lab 08/20/17 0428 08/21/17 0436  08/23/17 0415 08/24/17 0500 08/25/17 0414  NA 145 143   < > 139 142 138  K 3.3* 3.3*   < > 3.3* 2.8* 3.0*  CL 116* 107   < > 97* 101 100  CO2 20* 26   < > 29 28 28   GLUCOSE 136* 79   < > 90 78 206*  BUN 15 9   < > 10 8 5*  CREATININE 0.38* 0.34*   < > 0.50 0.34* 0.47  CALCIUM 7.9* 8.5*   < > 8.8* 8.4* 8.2*  MG 1.8 1.7  --   --   --  1.7  PHOS 1.3* 2.5  --   --   --  2.4*   < > = values in this interval not displayed.   GFR: Estimated Creatinine Clearance: 52.3 mL/min (by C-G formula based on SCr of 0.47 mg/dL).  Liver Function Tests: Recent Labs  Lab 08/25/17 0414  AST 28  ALT 25  ALKPHOS 42  BILITOT 0.8  PROT 4.6*  ALBUMIN 2.5*    Recent Results (from the past 240 hour(s))  MRSA PCR Screening     Status: None   Collection Time: 08/17/17  1:17 PM  Result Value Ref Range Status   MRSA by PCR NEGATIVE NEGATIVE Final    Comment:        The GeneXpert MRSA Assay (FDA approved for NASAL specimens only), is one component of a comprehensive MRSA colonization surveillance program. It is not intended to diagnose MRSA infection nor to guide or monitor treatment for MRSA infections. Performed at [facility], [place] [address] [place], [place] [address]   Culture, respiratory (non-expectorated)     Status: None   Collection Time: 08/17/17  8:30 PM  Result Value Ref Range Status   Specimen Description TRACHEAL ASPIRATE  Final   Special Requests NONE  Final   Gram Stain   Final    ABUNDANT WBC PRESENT, PREDOMINANTLY PMN ABUNDANT GRAM POSITIVE COCCI RARE GRAM POSITIVE RODS    Culture   Final    Consistent with normal respiratory flora. Performed at [facility], [place] [address], [place], [place] [address]    Report Status 08/20/2017 FINAL  Final  Culture, blood (routine x 2)     Status: None   Collection Time: 08/17/17  9:33 PM  Result Value Ref Range Status   Specimen Description BLOOD RIGHT HAND  Final   Special Requests   Final    BOTTLES DRAWN AEROBIC ONLY Blood Culture results may not be optimal due to an inadequate volume of blood received in culture bottles  Culture   Final    NO GROWTH 5 DAYS Performed at [facility], [place] [address], [place], [place] [address]    Report Status 08/22/2017 FINAL  Final  Body fluid culture     Status: None   Collection Time: 08/18/17 10:44 AM  Result Value Ref Range Status   Specimen Description FLUID PLEURAL LEFT  Final   Special Requests Immunocompromised  Final   Gram Stain   Final    ABUNDANT WBC PRESENT, PREDOMINANTLY PMN NO ORGANISMS SEEN    Culture   Final    NO GROWTH 3 DAYS Performed at [facility], [place] [address] [place], [place] [address]    Report Status 08/21/2017 FINAL  Final  Culture, respiratory     Status: None   Collection Time: 08/18/17 11:29 AM  Result Value Ref Range Status   Specimen Description TRACHEAL ASPIRATE  Final   Special Requests NONE  Final   Gram Stain   Final    FEW WBC PRESENT, PREDOMINANTLY PMN RARE GRAM POSITIVE COCCI    Culture   Final    Consistent with normal respiratory flora. Performed at [facility], [place] [address], [place], [place] [address]    Report Status 08/20/2017 FINAL  Final     Scheduled Meds: . atenolol  50 mg Per Tube Daily  . chlorhexidine  15 mL Mouth Rinse BID  . Chlorhexidine Gluconate Cloth  6 each Topical Daily  . feeding supplement (PRO-STAT SUGAR FREE 64)  30 mL Per Tube Daily  . hydrALAZINE  10 mg Per Tube Q6H  . mouth rinse  15 mL Mouth Rinse q12n4p  . methylPREDNISolone (SOLU-MEDROL) injection  40 mg Intravenous Daily  . pantoprazole (PROTONIX) IV  40 mg Intravenous Q24H  . potassium chloride  20 mEq Per Tube TID    . sodium chloride flush  3 mL Intravenous Q12H   Continuous Infusions: . dextrose 25 mL/hr at 08/25/17 1000  . feeding supplement (OSMOLITE 1.2 CAL) 55 mL/hr at 08/25/17 1000  . mycophenolate (CELLCEPT) IV 1,000 mg (08/25/17 1155)     LOS: 8 days   Cherene Altes, MD [facility] Office  [phone] Pager - Text Page per Amion  If 7PM-7AM, please contact night-coverage per Amion 08/25/2017, 12:23 PM

## 2017-08-25 NOTE — Progress Notes (Signed)
  Speech Language Pathology Treatment: Dysphagia  Patient Details Name: [name] [name] MRN: [id number] DOB: 1940/03/28 Today's Date: 08/25/2017 Time: 4696-2952 SLP Time Calculation (min) (ACUTE ONLY): 11 min  Assessment / Plan / Recommendation Clinical Impression  Pt is more alert this morning and showing improving oral acceptance, although labial seal remains weak. Moderate anterior spillage is observed with thin liquid trials. She is triggering a swallow more consistently today, although time from presentation to palpation of hyolaryngeal movement appears increasingly prolonged as trials progress. Wet vocal quality and immediate coughing are elicited, with SLP orally suctioning a moderate amount of blood-tinged secretions. Given current decisions regarding [place], pt will need MBS prior to diet initiation to assess oropharyngeal function. Would recommending completing testing as pt shows consistency with swallow trigger. Will f/u for readiness.   HPI HPI: Pt is a 8yoF with hx Polymyositis, CVA, Gastric leiomyoma, HTN, GERD, and dCHF, who presented to ER with BRBPR, hypotension, and AMS. Difficult intubation 8/5, extubated 8/8. EGD showed no signs of bleeding. CXR post-intubation showed L PTX. CT Head negative. BSE 06/13/17 recommended Dys 3 diet and thin liquids, advanced to regular textures prior to d/c. MBS in May 2019 showed an overall functional swallow with a single incident of flash penetration. Pt was noted to cough during MBS but not in the setting of airway compromise. There was also concern for a prominent CP and a possible Zencker's diverticulum developing, but no backflow was observed. Dys 3 diet and thin liquids were recommended at that time.      SLP Plan  Continue with current plan of care       Recommendations  Diet recommendations: NPO Medication Administration: Via alternative means                Oral Care Recommendations: Oral care QID Follow up Recommendations:  (tba) SLP Visit Diagnosis: Dysphagia, unspecified (R13.10) Plan: Continue with current plan of care       GO                [name] [name] 08/25/2017, 9:13 AM  [name] [name], M.A. CCC-SLP [phone]

## 2017-08-25 NOTE — Progress Notes (Signed)
Daily Progress Note   Patient Name: [name] [name]       Date: 08/25/2017 DOB: 04/02/1940  Age: 77 y.o. MRN#: [id number] Attending Physician: Cherene Altes, MD Primary Care Physician: [name], MD Admit Date: 08/17/2017  Reason for Consultation/Follow-up: Psychosocial/spiritual support  Subjective: Patient is resting in bed with DHT in place. She shakes her head when asked if she is having pain. She nods she feels better today. Excessive secretions in mouth. Family to bedside, no questions.   Length of Stay: 8  Current Medications: Scheduled Meds:  . atenolol  50 mg Per Tube Daily  . chlorhexidine  15 mL Mouth Rinse BID  . Chlorhexidine Gluconate Cloth  6 each Topical Daily  . feeding supplement (PRO-STAT SUGAR FREE 64)  30 mL Per Tube Daily  . hydrALAZINE  10 mg Per Tube Q6H  . mouth rinse  15 mL Mouth Rinse q12n4p  . methylPREDNISolone (SOLU-MEDROL) injection  40 mg Intravenous Daily  . pantoprazole (PROTONIX) IV  40 mg Intravenous Q24H  . sodium chloride flush  3 mL Intravenous Q12H    Continuous Infusions: . ampicillin-sulbactam (UNASYN) IV Stopped (08/25/17 0640)  . dextrose 25 mL/hr at 08/25/17 1000  . feeding supplement (OSMOLITE 1.2 CAL) 55 mL/hr at 08/25/17 1000  . mycophenolate (CELLCEPT) IV Stopped (08/25/17 0154)    PRN Meds: acetaminophen (TYLENOL) oral liquid 160 mg/5 mL, hydrALAZINE, morphine injection, [DISCONTINUED] ondansetron **OR** ondansetron (ZOFRAN) IV, sodium chloride flush  Physical Exam  Constitutional: No distress.  Pulmonary/Chest: Effort normal.  Neurological: She is alert.  Skin: Skin is warm and dry.            Vital Signs: BP (!) 141/92   Pulse (!) 101   Temp 97.9 F (36.6 C) (Oral)   Resp (!) 28   Ht 5\' 2"  (1.575 m)   Wt 65.5  kg   SpO2 95%   BMI 26.41 kg/m  SpO2: SpO2: 95 % O2 Device: O2 Device: Room Air O2 Flow Rate: O2 Flow Rate (L/min): 3 L/min  Intake/output summary:   Intake/Output Summary (Last 24 hours) at 08/25/2017 1043 Last data filed at 08/25/2017 1000 Gross per 24 hour  Intake 3484.49 ml  Output 1100 ml  Net 2384.49 ml   LBM: Last BM Date: 08/22/17 Baseline Weight: Weight: 63.5 kg Most recent weight:  Weight: 65.5 kg       Palliative Assessment/Data: Tube feeds      Patient Active Problem List   Diagnosis Date Noted  . Acute upper GI bleed 08/17/2017  . Nontraumatic hemorrhagic shock ([place]) 08/17/2017  . Hyperlipidemia 07/26/2017  . Pressure ulcer of buttock 07/22/2017  . Neck mass 07/21/2017  . Neuropathic pain   . 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 Neurologic deficit due to acute ischemic stroke ([employer])   . Hypokalemia   . CVA (cerebral vascular accident) ([place]) 06/09/2017  . Multiple pulmonary nodules 06/09/2017  . Hypothermia 10/22/2016  . Chronic diastolic CHF (congestive heart failure) ([place]) 10/22/2016  . Abdominal pain, unspecified site 05/17/2013  . Nausea alone 05/17/2013  . 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 Abdominal pain, left lower quadrant 05/28/2011  . Cecal ulcer with hemorrhage 01/10/2011  . Acute blood loss anemia 12/06/2010  . Anemia 12/05/2010  . Adrenal insufficiency ([place]) 12/05/2010  . Osteoporosis 12/05/2010  . LUNG INVOLVEMENT OTHER DISEASES CLASSIFIED ELSW 08/13/2009  . OTHER SPECIFIED DISORDER OF STOMACH AND DUODENUM 07/06/2008  . Essential hypertension 04/26/2007  . ALLERGIC RHINITIS 04/26/2007  . GASTROESOPHAGEAL REFLUX DISEASE 04/26/2007  . Polymyositis ([place]) 04/26/2007  . COLONIC POLYPS, ADENOMATOUS 02/06/2005    Recommendations/Plan:  Conversation with CCM.  Plan to continue [place] around 5 days and see how she does. Will reconvene for [place] before PEG placement.     Code Status:      Code Status Orders  (From admission, onward)         Start     Ordered   08/17/17 1148  Do not attempt resuscitation (DNR)  Continuous    Question Answer Comment  In the event of cardiac or respiratory ARREST Do not call a "code blue"   In the event of cardiac or respiratory ARREST Do not perform Intubation, CPR, defibrillation or ACLS   In the event of cardiac or respiratory ARREST Use medication by any route, position, wound care, and other measures to relive pain and suffering. May use oxygen, suction and manual treatment of airway obstruction as needed for comfort.      08/17/17 1148        Code Status History    Date Active Date Inactive Code Status Order ID Comments User Context   08/17/2017 0829 08/17/2017 1148 DNR [id number]  Nat Christen, MD ED   07/21/2017 1228 07/23/2017 1804 DNR [id number]  Karmen Bongo, MD ED   06/12/2017 1705 06/25/2017 1655 Full Code [id number]  Cathlyn Parsons, PA-C Inpatient   06/12/2017 1705 06/12/2017 1705 Full Code [id number]  Cathlyn Parsons, PA-C Inpatient   06/10/2017 0330 06/12/2017 1657 Full Code [id number]  Bethena Roys, MD Inpatient   10/22/2016 1955 10/24/2016 2009 Full Code [id number]  Vianne Bulls, MD ED       Prognosis:  Poor longterm.   Discharge Planning:  To Be Determined  Care plan was discussed with RN, CCM  Thank you for allowing the Palliative Medicine Team to assist in the care of this patient.   Total Time 25 min Prolonged Time Billed  no       Greater than 50%  of this time was spent counseling and coordinating care related to the above assessment and plan.  Asencion Gowda, NP  Please contact Palliative Medicine Team phone at [phone] for questions and concerns.

## 2017-08-26 LAB — COMPREHENSIVE METABOLIC PANEL
ALBUMIN: 2.7 g/dL — AB (ref 3.5–5.0)
ALT: 26 U/L (ref 0–44)
AST: 29 U/L (ref 15–41)
Alkaline Phosphatase: 53 U/L (ref 38–126)
Anion gap: 9 (ref 5–15)
BUN: 10 mg/dL (ref 8–23)
CHLORIDE: 103 mmol/L (ref 98–111)
CO2: 27 mmol/L (ref 22–32)
Calcium: 8.4 mg/dL — ABNORMAL LOW (ref 8.9–10.3)
Creatinine, Ser: 0.43 mg/dL — ABNORMAL LOW (ref 0.44–1.00)
GFR calc Af Amer: >60 mL/min (ref >60)
GFR calc non Af Amer: >60 mL/min (ref >60)
GLUCOSE: 111 mg/dL — AB (ref 70–99)
POTASSIUM: 3.8 mmol/L (ref 3.5–5.1)
SODIUM: 139 mmol/L (ref 135–145)
Total Bilirubin: 0.6 mg/dL (ref 0.3–1.2)
Total Protein: 5.2 g/dL — ABNORMAL LOW (ref 6.5–8.1)

## 2017-08-26 LAB — CBC
HEMATOCRIT: 30 % — AB (ref 36.0–46.0)
Hemoglobin: 9.5 g/dL — ABNORMAL LOW (ref 12.0–15.0)
MCH: 26.2 pg (ref 26.0–34.0)
MCHC: 31.7 g/dL (ref 30.0–36.0)
MCV: 82.6 fL (ref 78.0–100.0)
Platelets: 287 10*3/uL (ref 150–400)
RBC: 3.63 MIL/uL — AB (ref 3.87–5.11)
RDW: 18.1 % — ABNORMAL HIGH (ref 11.5–15.5)
WBC: 7.6 10*3/uL (ref 4.0–10.5)

## 2017-08-26 LAB — GLUCOSE, CAPILLARY
GLUCOSE-CAPILLARY: 103 mg/dL — AB (ref 70–99)
GLUCOSE-CAPILLARY: 130 mg/dL — AB (ref 70–99)
Glucose-Capillary: 103 mg/dL — ABNORMAL HIGH (ref 70–99)
Glucose-Capillary: 107 mg/dL — ABNORMAL HIGH (ref 70–99)
Glucose-Capillary: 134 mg/dL — ABNORMAL HIGH (ref 70–99)

## 2017-08-26 LAB — MAGNESIUM: MAGNESIUM: 2.3 mg/dL (ref 1.7–2.4)

## 2017-08-26 LAB — PHOSPHORUS: PHOSPHORUS: 2.3 mg/dL — AB (ref 2.5–4.6)

## 2017-08-26 NOTE — Progress Notes (Signed)
  Speech Language Pathology Treatment: Dysphagia  Patient Details Name: [name] [name] MRN: [id number] DOB: [birth year] Today's Date: 08/26/2017 Time: 8299-3716 SLP Time Calculation (min) (ACUTE ONLY): 30 min  Assessment / Plan / Recommendation Clinical Impression  Pt continues to make slow but gradual progress toward po readiness. Oral care was completed with suction. Pt continues unable to follow commands consistently given verbal cues. Accuracy improves slightly with tactile cues to follow commands. Pt was presented with single ice chips x2 and 1/2 teaspoon boluses of water over time. Slight anterior leakage leakage noted with thin liquid trials. Minimal oral manipulation noted with ice chips, with inconsistent hyolaryngeal movement to palpation. Delayed weak nonproductive cough response noted after po trials. Multiple family members present report a strong desire to continue po trials on a daily basis, and they are quite eager to proceed with objective study as soon as possible. Even if pt were safe for po intake at this time, however, sufficient po intake is unlikely, and is currently not functional for adequate intake.  If pt alertness and acceptance of po trials continues to improve, completion of at least a limited MBS may be beneficial to objectively assess swallow safety and appropriateness for any amount of po intake. This may assist family with decisions regarding whether to place PEG tube or not. SLP will follow up for continued po trials and education with family.    HPI HPI: Pt is a 68yoF with hx Polymyositis, CVA, Gastric leiomyoma, HTN, GERD, and dCHF, who presented to ER with BRBPR, hypotension, and AMS. Difficult intubation 8/5, extubated 8/8. EGD showed no signs of bleeding. CXR post-intubation showed L PTX. CT Head negative. BSE 06/13/17 recommended Dys 3 diet and thin liquids, advanced to regular textures prior to d/c. MBS in May 2019 showed an overall functional swallow with a single  incident of flash penetration. Pt was noted to cough during MBS but not in the setting of airway compromise. There was also concern for a prominent CP and a possible Zencker's diverticulum developing, but no backflow was observed. Dys 3 diet and thin liquids were recommended at that time.      SLP Plan  Continue with current plan of care       Recommendations  Diet recommendations: NPO Medication Administration: Via alternative means                Oral Care Recommendations: Oral care QID Follow up Recommendations: (TBD) SLP Visit Diagnosis: Dysphagia, unspecified (R13.10) Plan: Continue with current plan of care       GO              Celia B. Quentin [facility], CCC-SLP Speech Language Pathologist [phone]  Shonna Chock 08/26/2017, 1:49 PM

## 2017-08-26 NOTE — Clinical Social Work Note (Signed)
Patient's son, Tacara Hadlock requested and was provided with a note for his job which confirmed that his mother is a patient at [facility]. Son expressed appreciation to CSW for note. CSW signing off as no other SW intervention services needed at this time. Please reconsult if needed.  Criss Pallone Givens, MSW, LCSW Licensed Clinical Social Worker [place] [phone]

## 2017-08-26 NOTE — Progress Notes (Signed)
Patient Demographics:    [name] [name], is a 77 y.o. female, DOB - 1940/11/02, IWO:[id number]  Admit date - 08/17/2017   Admitting Physician [name], MD  Outpatient Primary MD for the patient is [name], MD  LOS - 9   Chief Complaint  Patient presents with  . Rectal Bleeding        Subjective:    [name] [name] today has no fevers, no emesis,  No chest pain, tolerating Osmolite 1.2 at 25 ml/hr , plan of care discussed with patient's son [name] [name] by phone, questions answered  Assessment  & Plan :    Active Problems:   Polymyositis (HCC)   Adrenal insufficiency (HCC)   Acute blood loss anemia   CVA (cerebral vascular accident) ([place])   Acute upper GI bleed   Nontraumatic hemorrhagic shock ([place])   Brief Narrative:  77yo female SNF resident with a hx of polymyositis, CVA, gastric leiomyoma, hypertension, GERD, and diastolic CHF who was brought to the ED w/ bright red blood per rectum. She had episodes of hypotension and unresponsiveness in the ED and required intubation. She was transfused 2 unit of PRBC transfusion. Chest x-ray post intubation showed left pneumothorax. She was extubated on 8/8.  Significant Events: 8/5 admit - intubated in ED - L sided chest tube placed  8/8 extubated      Assessment & Plan:  1)Hematochezia - Hemorrhagic shock--- resolved hemorrhagic shock, one episode of BM with blood on 08/26/2017, H&H has been stable, the globin on 08/26/2017 is 9.5, continue to monitor H&H and transfuse as clinically indicated, EGD on 08/17/17 by Dr Lyndel Safe negative -  s/p 2U PRBC - CT abdom/pelvis w/o acute findings - cont to holdAspirin, Plavix - diverticular v/s recurrent colonic AVM - no further endoscopic work-up planned      2)Acute hypoxic respiratory failure - Pulmonary edema / B pleural effusions - Aspiration pneumonia--- improved from a respiratory standpoint, Intubated  08/17/17 and Extubated on 08/20/17 - likely secondary to aspiration pneumonia,pulmonary edema,& B pleural effusions - resolved w/ pt now on RA - off abx   3)Dysphagia- Has failed numerous swallow studies - family agreed to have Cortrak placed 08/24/17 after 3 failed attempts at NG placement 08/23/17 - tolerating Cortrak and tube feeding well thus far, d/w speech pathologist Ms. Buechet on 08/26/2017, at this time patient's son Evangeline Gula states that they are not ready to make a decision on comfort measures versus PEG tube placement yet they want to continue to feed through Dobbhoff tube /Cortrax for now  4)Acute encephalopathy- multi-factorial, ikely due to hypoperfusion, aspiration pneumonia, + Hx of CVA - head CT noted mild diffuse cerebral and cerebellar atrophy,marked chronic white matter ischemic change - at baseline patient was able to hold a conversation, withno memory deficits or dementia per son - B12 and folate not low - avoid sedatives  5)Hypertension--improved BP control, hydralazine 25 mg 3 times daily, continue metoprolol 25 mg 3 times daily, along with isosorbide 5 mg 3 times daily,   may use IV Hydralazine 10 mg  Every 4 hours Prn for systolic blood pressure over 160 mmhg  6)Left-sided pneumothorax- chest tube removed - resolved - felt to be related to intubation/positive pressure ventilation   7)Polymyositis - Chronic adrenal insufficiency-  OnCellCept and prednisone - follows with Rheumatologist (Dr. 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 SNF  10)FEN--continue Osmolite 1.2 tube feed at 25 mL an hour,  titrate  DVT prophylaxis: SCDs (Gi Bleed) Code Status: DNR - NO CODE Family Communication:  plan of care discussed with patient's son [name] [name] by phone, questions answered  Disposition Plan: TBD  Consultants:  PCCM GI Palliative Speech  Antimicrobials:  Unasyn 8/7 > 8/13   Lab Results  Component Value Date   PLT 287 08/26/2017    Inpatient Medications  Scheduled Meds: . chlorhexidine  15 mL Mouth Rinse BID  . Chlorhexidine Gluconate Cloth  6 each Topical Daily  . feeding supplement (PRO-STAT SUGAR FREE 64)  30 mL Per Tube Daily  . hydrALAZINE  25 mg Per Tube TID  . isosorbide dinitrate  5 mg Per Tube TID  . mouth rinse  15 mL Mouth Rinse q12n4p  . metoprolol tartrate  25 mg Per Tube TID PC  . mycophenolate  1,000 mg Per Tube BID  . pantoprazole sodium  40 mg Per Tube Daily  . potassium chloride  20 mEq Per Tube TID  . predniSONE  20 mg Per Tube BID  . sodium chloride flush  3 mL Intravenous Q12H   Continuous Infusions: . sodium chloride 10 mL/hr at 08/25/17 2000  . feeding supplement (OSMOLITE 1.2 CAL) 55 mL/hr at 08/26/17 1040   PRN Meds:.acetaminophen (TYLENOL) oral liquid 160 mg/5 mL, hydrALAZINE, morphine injection, [DISCONTINUED] ondansetron **OR** ondansetron (ZOFRAN) IV, sodium chloride flush  Anti-infectives (From admission, onward)   Start     Dose/Rate Route Frequency Ordered Stop   08/19/17 1400  Ampicillin-Sulbactam (UNASYN) 3 g in sodium chloride 0.9 % 100 mL IVPB  Status:  Discontinued     3 g 200 mL/hr over 30 Minutes Intravenous Every 8 hours 08/19/17 1103 08/25/17 1220   08/18/17 1000  piperacillin-tazobactam (ZOSYN) IVPB 3.375 g  Status:  Discontinued     3.375 g 12.5 mL/hr over 240 Minutes Intravenous Every 8 hours 08/18/17 0512 08/19/17 1103   08/18/17 0515  piperacillin-tazobactam (ZOSYN) IVPB 3.375 g     3.375 g 100 mL/hr over 30 Minutes Intravenous  Once 08/18/17 0512 08/18/17 0631        Objective:   Vitals:   08/26/17 0439  08/26/17 0500 08/26/17 0810 08/26/17 1408  BP: (!) 186/98  (!) 173/94 (!) 142/83  Pulse: 93  96 98  Resp: 18  19   Temp: 99 F (37.2 C)  97.8 F (36.6 C) 99.2 F (37.3 C)  TempSrc:   Oral Oral  SpO2: 98%  96% 98%  Weight:  65 kg    Height:        Wt Readings from Last 3 Encounters:  08/26/17 65 kg  07/24/17 65.3 kg  07/21/17 65.5 kg     Intake/Output Summary (Last 24 hours) at 08/26/2017 1539 Last data filed at 08/26/2017 0900 Gross per 24 hour  Intake 660 ml  Output 950 ml  Net -290 ml     Physical Exam  Gen:- Awake Alert,  In no apparent distress ,  nonverbal, HEENT:- [place].AT, No sclera icterus Nose-contrax tube with tube feeding running Neck-Supple Neck,No JVD,.  Lungs-diminished in bases, no wheezing CV- S1, S2 normal, right-sided Port-A-Cath Abd-  +ve B.Sounds, Abd Soft, No tenderness,    Extremity/Skin:- No  edema,   warm and dry Psych-Limited psych exam due to patient being nonverbal and unable to follow commands or instructions,  neuro-no new focal deficits, no tremors (at baseline apparently patient is mostly bedbound) GU- Foley   Data Review:   Micro Results Recent Results (from the past 240 hour(s))  MRSA PCR Screening     Status: None   Collection Time: 08/17/17  1:17 PM  Result Value Ref Range Status   MRSA by PCR NEGATIVE NEGATIVE Final    Comment:        The GeneXpert MRSA Assay (FDA approved for NASAL specimens only), is one component of a comprehensive MRSA colonization surveillance program. It is not intended to diagnose MRSA infection nor to guide or monitor treatment for MRSA infections. Performed at [facility], [place] [address], [place], [place] [address]   Culture, respiratory (non-expectorated)     Status: None   Collection Time: 08/17/17  8:30 PM  Result Value Ref Range Status   Specimen Description TRACHEAL ASPIRATE  Final   Special Requests NONE  Final   Gram Stain   Final    ABUNDANT WBC PRESENT, PREDOMINANTLY  PMN ABUNDANT GRAM POSITIVE COCCI RARE GRAM POSITIVE RODS    Culture   Final    Consistent with normal respiratory flora. Performed at [facility], [place] [address] [place], [place] [address]    Report Status 08/20/2017 FINAL  Final  Culture, blood (routine x 2)     Status: None   Collection Time: 08/17/17  9:33 PM  Result Value Ref Range Status   Specimen Description BLOOD RIGHT HAND  Final   Special Requests   Final    BOTTLES DRAWN AEROBIC ONLY Blood Culture results may not be optimal due to an inadequate volume of blood received in culture bottles   Culture   Final    NO GROWTH 5 DAYS Performed at [facility], [place] [address] [place], [place] [address]    Report Status 08/22/2017 FINAL  Final  Body fluid culture     Status: None   Collection Time: 08/18/17 10:44 AM  Result Value Ref Range Status   Specimen Description FLUID PLEURAL LEFT  Final   Special Requests Immunocompromised  Final   Gram Stain   Final    ABUNDANT WBC PRESENT, PREDOMINANTLY PMN NO ORGANISMS SEEN    Culture   Final    NO GROWTH 3 DAYS Performed at [facility], [place] [address] [place], [place] [address]    Report Status 08/21/2017 FINAL  Final  Culture, respiratory     Status: None   Collection Time: 08/18/17 11:29 AM  Result Value Ref Range Status   Specimen Description TRACHEAL ASPIRATE  Final   Special Requests NONE  Final   Gram Stain   Final    FEW WBC PRESENT, PREDOMINANTLY PMN RARE GRAM POSITIVE COCCI    Culture   Final    Consistent with normal respiratory flora. Performed at [facility], [place] [address] [place],  [address]    Report Status 08/20/2017 FINAL  Final    Radiology Reports Ct Angio Head W Or Wo Contrast  Result Date: 08/17/2017 CLINICAL DATA:  Focal neuro deficit. Altered mental status.  Periods of unresponsiveness EXAM: CT ANGIOGRAPHY HEAD AND NECK TECHNIQUE: Multidetector CT imaging of the head and neck was performed using the standard  protocol during bolus administration of intravenous contrast. Multiplanar CT image reconstructions and MIPs were obtained to evaluate the vascular anatomy. Carotid stenosis measurements (when applicable) are obtained utilizing NASCET criteria, using the distal internal carotid diameter as the denominator. CONTRAST:  169mL ISOVUE-370 IOPAMIDOL (ISOVUE-370) INJECTION 76% COMPARISON:  Noncontrast head CT earlier today. CTA head neck 06/09/2017 FINDINGS: CTA NECK FINDINGS Aortic arch: Normal.  Three vessel branching. Right carotid system: Limited by motion at the distal ICA. Visible vessels are smooth and widely patent. Left carotid system: Limited by motion at the distal left ICA. Visible vessels are smooth and widely patent. Vertebral arteries: No proximal subclavian flow limiting stenosis. Both vertebral arteries are patent in the neck. Skeleton: No acute finding Other neck: No acute finding. Upper chest: Porta catheter on the right.  No acute finding. Review of the MIP images confirms the above findings CTA HEAD FINDINGS Anterior circulation: No evidence of emergent large vessel occlusion or proximal flow limiting stenosis atherosclerotic narrowing and irregularity of medium size vessels (M2 level and beyond) that is extensive, see markings on MIP images. Negative for aneurysm. Posterior circulation: The vertebral and basilar arteries are smooth and widely patent. Symmetric flow in bilateral branches. Atherosclerotic irregularity of left more than right PCA branches. Negative for aneurysm Venous sinuses: Patent Anatomic variants: None significant Delayed phase: Not obtained Review of the MIP images confirms the above findings IMPRESSION: 1. No emergent large vessel occlusion or other acute finding. Stable from 06/09/2017. 2. Extensive atherosclerotic irregularity and narrowing of intracranial medium size vessels. 3. No significant atherosclerosis or stenosis in the neck. Electronically Signed   By: Monte Fantasia  M.D.   On: 08/17/2017 11:31   Dg Chest 1 View  Result Date: 08/22/2017 CLINICAL DATA:  Hypoxia, altered mental status EXAM: CHEST  1 VIEW COMPARISON:  the previous day's study FINDINGS: The left pigtail drain catheter is been removed. No pneumothorax evident. Blunting of the left lateral costophrenic angle suggesting small effusion. Stable right IJ port catheter. Atelectasis/infiltrate in the lung bases left greater than right, improved since prior study. Heart size upper limits normal for technique. Aortic Atherosclerosis (ICD10-170.0). Regional bones unremarkable. IMPRESSION: 1. Interval removal of left chest tube with no pneumothorax. 2. Possible small left pleural effusion. 3. Bibasilar atelectasis/consolidation, improved since previous exam Electronically Signed   By: Lucrezia Europe M.D.   On: 08/22/2017 10:04   Ct Head Wo Contrast  Result Date: 08/19/2017 CLINICAL DATA:  Altered mental status.  Cephalopathy. EXAM: CT HEAD WITHOUT CONTRAST TECHNIQUE: Contiguous axial images were obtained from the base of the skull through the vertex without intravenous contrast. COMPARISON:  08/17/2017. FINDINGS: Brain: Diffusely enlarged ventricles and subarachnoid spaces. Patchy white matter low density in both cerebral hemispheres. Streak artifacts from a high density lead attached to the right frontal bone. No intracranial hemorrhage, mass lesion or CT evidence of acute infarction. Vascular: No hyperdense vessel or unexpected calcification. Skull: Normal. Negative for fracture or focal lesion. Sinuses/Orbits: Unremarkable. Other: None. IMPRESSION: 1. No acute abnormality. 2. Stable mild diffuse cerebral and cerebellar atrophy. 3. Stable marked chronic small vessel white matter ischemic changes in both cerebral hemispheres. Electronically Signed   By: Claudie Revering M.D.   On: 08/19/2017 13:50   Ct Head Wo Contrast  Result Date: 08/17/2017 CLINICAL DATA:  Bilateral arm weakness EXAM: CT HEAD WITHOUT CONTRAST TECHNIQUE:  Contiguous axial  images were obtained from the base of the skull through the vertex without intravenous contrast. COMPARISON:  06/09/2017 brain MRI FINDINGS: Brain: Low-density in the posterior left frontal subcortical white matter is in close approximation to infarcts seen on 06/09/2017 brain MRI. There is a background of advanced chronic small vessel ischemia with confluent low-density in the cerebral white matter. Indistinct deep gray nuclei from chronic ischemic injury, including discrete remote lacunar infarct in the right thalamus. No evident hemorrhage, mass, or hydrocephalus. Subcentimeter left parietal inner table calcification considered incidental. Vascular: No hyperdense vessel.  Atherosclerotic calcification. Skull: No evidence of fracture or bone lesion. Sinuses/Orbits: No pathologic finding. Bilateral cataract resection. Other: Motion degraded scan, best obtainable due to patient condition. IMPRESSION: 1. Motion degraded study without definite acute finding. 2. A small left cerebral white matter infarct was likely present on 06/09/2017 brain MRI. 3. Severe chronic small vessel ischemia. Electronically Signed   By: Monte Fantasia M.D.   On: 08/17/2017 09:19   Ct Angio Neck W Or Wo Contrast  Result Date: 08/17/2017 CLINICAL DATA:  Focal neuro deficit. Altered mental status. Periods of unresponsiveness EXAM: CT ANGIOGRAPHY HEAD AND NECK TECHNIQUE: Multidetector CT imaging of the head and neck was performed using the standard protocol during bolus administration of intravenous contrast. Multiplanar CT image reconstructions and MIPs were obtained to evaluate the vascular anatomy. Carotid stenosis measurements (when applicable) are obtained utilizing NASCET criteria, using the distal internal carotid diameter as the denominator. CONTRAST:  135mL ISOVUE-370 IOPAMIDOL (ISOVUE-370) INJECTION 76% COMPARISON:  Noncontrast head CT earlier today. CTA head neck 06/09/2017 FINDINGS: CTA NECK FINDINGS Aortic  arch: Normal.  Three vessel branching. Right carotid system: Limited by motion at the distal ICA. Visible vessels are smooth and widely patent. Left carotid system: Limited by motion at the distal left ICA. Visible vessels are smooth and widely patent. Vertebral arteries: No proximal subclavian flow limiting stenosis. Both vertebral arteries are patent in the neck. Skeleton: No acute finding Other neck: No acute finding. Upper chest: Porta catheter on the right.  No acute finding. Review of the MIP images confirms the above findings CTA HEAD FINDINGS Anterior circulation: No evidence of emergent large vessel occlusion or proximal flow limiting stenosis atherosclerotic narrowing and irregularity of medium size vessels (M2 level and beyond) that is extensive, see markings on MIP images. Negative for aneurysm. Posterior circulation: The vertebral and basilar arteries are smooth and widely patent. Symmetric flow in bilateral branches. Atherosclerotic irregularity of left more than right PCA branches. Negative for aneurysm Venous sinuses: Patent Anatomic variants: None significant Delayed phase: Not obtained Review of the MIP images confirms the above findings IMPRESSION: 1. No emergent large vessel occlusion or other acute finding. Stable from 06/09/2017. 2. Extensive atherosclerotic irregularity and narrowing of intracranial medium size vessels. 3. No significant atherosclerosis or stenosis in the neck. Electronically Signed   By: Monte Fantasia M.D.   On: 08/17/2017 11:31   Dg Chest Port 1 View  Result Date: 08/21/2017 CLINICAL DATA:  History of pneumothorax.  Chest tube in place. EXAM: PORTABLE CHEST 1 VIEW COMPARISON:  August 20, 2016 FINDINGS: No pneumothorax. The left chest tube is stable. A layering effusion with underlying opacity in seen on the right. Probable edema. No nodules or masses. Stable cardiomediastinal silhouette. Stable right Port-A-Cath. IMPRESSION: 1. Left-sided chest tube.  No pneumothorax. 2.  Layering effusions bilaterally, right greater than left underlying atelectasis. 3. Pulmonary edema. Electronically Signed   By: Dorise Bullion III M.D   On: 08/21/2017 09:49  Dg Chest Port 1 View  Result Date: 08/20/2017 CLINICAL DATA:  Assess for pneumothorax. EXAM: PORTABLE CHEST 1 VIEW COMPARISON:  Chest radiograph performed earlier today at 4:32 a.m. FINDINGS: A right-sided chest port is noted ending about the mid SVC. Small bilateral pleural effusions are noted. Increased interstitial markings raise concern for pulmonary edema, worsened from the prior study. No pneumothorax is seen. A left chest pigtail catheter is noted in essentially unchanged position. The cardiomediastinal silhouette is borderline normal in size. No acute osseous abnormalities are seen. IMPRESSION: 1. No pneumothorax seen. Left chest pigtail catheter noted in essentially unchanged position. 2. Small bilateral pleural effusions noted. Increased interstitial markings raise concern for pulmonary edema, worsened from the prior study. Electronically Signed   By: Garald Balding M.D.   On: 08/20/2017 22:50   Dg Chest Port 1 View  Result Date: 08/20/2017 CLINICAL DATA:  Respiratory failure. EXAM: PORTABLE CHEST 1 VIEW COMPARISON:  08/19/2017 FINDINGS: Endotracheal tube remains present with the tip approximately 3 cm above the carina. Stable positioning pigtail chest tube on the left and right-sided Port-A-Cath. Gastric decompression tube extends below the diaphragm. Lungs show slight increase in right basilar atelectasis. The left lower lobe shows improved aeration. No overt edema, evidence of pneumothorax or pleural fluid. IMPRESSION: Improved aeration of the left lower lung. Slight increase in right basilar atelectasis. Electronically Signed   By: Aletta Edouard M.D.   On: 08/20/2017 11:08   Dg Chest Port 1 View  Result Date: 08/19/2017 CLINICAL DATA:  Endotracheal tube is assessment EXAM: PORTABLE CHEST 1 VIEW COMPARISON:  August 18, 2017 FINDINGS: The heart size and mediastinal contours are stable. Endotracheal tube is identified with distal tip 4.5 cm from carina in good position. A nasogastric tube is identified with distal tip not included on film but is at least in the stomach. A right central venous line is identified with distal tip in the superior vena cava, unchanged. Left chest tube is unchanged. There is pulmonary vascular congestion. Probable left pleural effusion is identified. Consolidation of left lung base is unchanged. The visualized skeletal structures are stable. IMPRESSION: Endotracheal tube is identified with distal tip 4.5 cm from carina, in good position. Pulmonary vascular congestion of the lungs. There is probable left pleural effusion with consolidation of left lung base unchanged compared prior exam. Electronically Signed   By: Abelardo Diesel M.D.   On: 08/19/2017 09:32   Dg Chest Port 1 View  Result Date: 08/18/2017 CLINICAL DATA:  Pneumothorax image timed for 1100hrs.  OG placement. EXAM: PORTABLE CHEST 1 VIEW COMPARISON:  08/18/2017 FINDINGS: Endotracheal tube is in place with tip approximately 3.5 centimeters above the carina. An orogastric tube is in place, tip off the image beyond the gastroesophageal junction. The patient has a RIGHT-sided Port-A-Cath with tip to level of superior vena cava. The patient has a LEFT-sided small bore chest tube. There is no pneumothorax. The heart is enlarged. Patient is rotated towards the LEFT. There is dense opacity at the LEFT lung base which obscures the LEFT hemidiaphragm. Mild subsegmental atelectasis in the RIGHT LOWER lobe, stable in appearance. IMPRESSION: 1. No pneumothorax. 2. Persistent dense LEFT LOWER lobe opacity. 3. Orogastric tube tip beyond the gastroesophageal junction. Electronically Signed   By: Nolon Nations M.D.   On: 08/18/2017 11:31   Dg Chest Port 1 View  Result Date: 08/18/2017 CLINICAL DATA:  Pneumothorax.  Shortness of breath. EXAM: PORTABLE  CHEST 1 VIEW COMPARISON:  One-view chest x-ray 08/17/2017. FINDINGS: Endotracheal tube is  stable. Right IJ catheter is stable. A left-sided chest tube is in place. No visible pneumothorax is present. Basilar atelectasis is present. Mild pulmonary vascular congestion is noted. IMPRESSION: 1. No significant pneumothorax with left-sided chest tube in place. 2. Left basilar airspace opacity likely representing small effusion and atelectasis. 3. Support apparatus is stable. Electronically Signed   By: San Morelle M.D.   On: 08/18/2017 07:07   Dg Chest Port 1 View  Result Date: 08/17/2017 CLINICAL DATA:  77 year old female with history of chest tube in the left side. EXAM: PORTABLE CHEST 1 VIEW COMPARISON:  Chest x-ray 08/17/2017. FINDINGS: Compared to the prior examination there has been interval placement of a small bore left chest tube with tip in the apex of the left hemithorax. Previously noted left-sided pneumothorax is significantly decreased in size compared to the prior examination, now likely less than 5-10% of the volume of the left hemithorax. Right internal jugular single-lumen porta cath with tip terminating near the superior cavoatrial junction. Patient is intubated, with the tip of the endotracheal tube approximately 3 cm above the carina. Lung volumes are low. Persistent elevation of the right hemidiaphragm. Bibasilar linear opacities and linear opacities in the left mid lung, likely to reflect areas of subsegmental atelectasis and/or scarring. No definite consolidative airspace disease. No pleural effusions. No evidence of pulmonary edema. Heart size appears borderline enlarged. The patient is rotated to the left on today's exam, resulting in distortion of the mediastinal contours and reduced diagnostic sensitivity and specificity for mediastinal pathology. Aortic atherosclerosis. IMPRESSION: 1. Support apparatus, as above. 2. Regression of previously noted left-sided pneumothorax following  chest tube placement, now extremely small. 3. Low lung volumes with bibasilar areas of subsegmental atelectasis and/or scarring. Electronically Signed   By: Vinnie Langton M.D.   On: 08/17/2017 20:41   Dg Chest Port 1 View  Result Date: 08/17/2017 CLINICAL DATA:  Difficult intubation.  Orogastric tube placement. EXAM: PORTABLE CHEST 1 VIEW COMPARISON:  Portable chest x-ray of Jun 09, 2017 FINDINGS: The endotracheal tube tip projects 1-2 cm above the carina. There is considerable subcutaneous emphysema however. There is a 10% or less left pneumothorax. There is subsegmental atelectasis in the right lower lung and in the left mid lung. There is subcutaneous emphysema in the left axillary region and bilaterally at the base of the neck. The heart is normal in size. The pulmonary vascularity is not engorged. The porta catheter tip projects over the midportion of the SVC. No esophagogastric tube is observed. IMPRESSION: There is a 10% or less left sided pneumothorax. There is a large amount of subcutaneous emphysema and pneumomediastinum and a small amount of pneumopericardium. The endotracheal tube tip projects approximately 1-2 cm above the carina. Subsegmental atelectasis in both lungs. No CHF.  There is calcification in the wall of the aortic arch. These results will be called to the ordering clinician or representative by the Radiologist Assistant, and communication documented in the PACS or zVision Dashboard. Electronically Signed   By: David  Martinique M.D.   On: 08/17/2017 14:37   Dg Abd Portable 1v  Result Date: 08/23/2017 CLINICAL DATA:  Nasogastric tube placement. EXAM: PORTABLE ABDOMEN - 1 VIEW COMPARISON:  08/18/2017. FINDINGS: Normal bowel gas pattern. No nasogastric tube seen. Mild scoliosis and lower lumbar spine fixation hardware. IMPRESSION: No nasogastric tube visualized. Electronically Signed   By: Claudie Revering M.D.   On: 08/23/2017 11:19   Dg Abd Portable 1v  Result Date: 08/18/2017 CLINICAL  DATA:  Pneumothorax image timed  for 1100hrs.  OG placement. EXAM: PORTABLE ABDOMEN - 1 VIEW COMPARISON:  CT of the abdomen and pelvis on 08/17/2017 FINDINGS: Orogastric tube has been placed, tip overlying the level of the proximal stomach. Patient is rotated towards the LEFT. There is persistent patchy density at the LEFT lung base. IMPRESSION: Orogastric tube tip to level of the stomach. LEFT LOWER lobe opacity. Electronically Signed   By: Nolon Nations M.D.   On: 08/18/2017 11:32   Dg Abd Portable 1v  Result Date: 08/17/2017 CLINICAL DATA:  Pre orogastric tube placement radiograph. EXAM: PORTABLE ABDOMEN - 1 VIEW COMPARISON:  Abdominal and pelvic CT scan of May 15, 2017 FINDINGS: There is a moderate amount of gas and stool within the pelvis. The patient has undergone previous lower lumbar fusion procedure. There numerous tubes and catheters overlying the lower pelvis. There is pneumomediastinum and pneumopericardium and likely hydropneumothorax on the left. IMPRESSION: The bowel gas pattern is within the limits of normal. Please see the accompanying chest x-ray dictation regarding the left-sided pneumothorax, pneumomediastinum, and pneumopericardium. Electronically Signed   By: David  Martinique M.D.   On: 08/17/2017 14:39   Ct Angio Abd/pel W/ And/or W/o  Result Date: 08/17/2017 CLINICAL DATA:  Gastrointestinal bleeding EXAM: CTA ABDOMEN AND PELVIS wITHOUT AND WITH CONTRAST TECHNIQUE: Multidetector CT imaging of the abdomen and pelvis was performed using the standard protocol during bolus administration of intravenous contrast. Multiplanar reconstructed images and MIPs were obtained and reviewed to evaluate the vascular anatomy. CONTRAST:  138mL ISOVUE-370 IOPAMIDOL (ISOVUE-370) INJECTION 76% COMPARISON:  05/15/2017 FINDINGS: VASCULAR Gastrointestinal bleeding: There is no blush of contrast within the gastrointestinal tract to suggest active gastrointestinal bleeding. Aorta: Nonaneurysmal and patent.  Scattered atherosclerotic calcifications. Celiac: Severe narrowing just beyond the origin. Branch vessels patent. SMA: Patent and diminutive. Renals: Bilateral single renal arteries are patent. IMA: Diminutive and patent. Inflow: Common, internal, and external iliac arteries are patent. Proximal Outflow: Grossly patent Veins: No obvious DVT. Review of the MIP images confirms the above findings. NON-VASCULAR Lower chest: There is consolidation at the left lung base. A large left pneumothorax is present. There is a large amount of mediastinal emphysema. There is a tiny right anterior pneumothorax. There is emphysema in the anterior chest soft tissues. Hepatobiliary: Tiny hypodensity in the right lobe of the liver towards the dome. It is stable. Gallbladder is within normal limits. Pancreas: Unremarkable Spleen: Unremarkable Adrenals/Urinary Tract: Benign cysts are present in both kidneys. There is high-density material in the collecting system of both kidneys which conforms to the collecting system likely representing a test dose of contrast with excretion. Adrenal glands are within normal limits. Foley catheter decompresses the bladder. There is bladder wall thickening. Stomach/Bowel: Stomach and duodenum are decompressed. No evidence of small-bowel obstruction. Sigmoid diverticulosis is present. There is no evidence of acute diverticulitis. Lymphatic: No abnormal retroperitoneal adenopathy. Reproductive: Uterus is absent.  Adnexa are within normal limits. Other: No free fluid. Musculoskeletal: Postoperative changes from posterior L4-5 fusion. L4 superior anterior endplate compression fracture is stable. IMPRESSION: VASCULAR No evidence of gastrointestinal bleeding. Severe narrowing just beyond the origin of the celiac axis. NON-VASCULAR Consolidation at the left lung base. Large left pneumothorax. Tiny right pneumothorax. Extensive pneumomediastinum. Postoperative changes in the lumbar spine. Stable L4 compression  deformity. Findings related to the left pneumothorax were discussed with Dr. Debbora Dus. Electronically Signed   By: Marybelle Killings M.D.   On: 08/17/2017 18:47     CBC Recent Labs  Lab 08/20/17 0428  08/22/17 0425 08/23/17  3295 08/24/17 0500 08/25/17 0414 08/26/17 0340  WBC 15.6*   < > 10.2 8.8 8.5 9.4 7.6  HGB 9.0*   < > 10.3* 10.1* 9.2* 9.2* 9.5*  HCT 27.4*   < > 31.8* 31.2* 28.5* 28.3* 30.0*  PLT 212   < > 221 248 217 232 287  MCV 80.8   < > 80.7 81.3 81.2 81.8 82.6  MCH 26.5   < > 26.1 26.3 26.2 26.6 26.2  MCHC 32.8   < > 32.4 32.4 32.3 32.5 31.7  RDW 18.5*   < > 18.0* 17.9* 18.1* 18.1* 18.1*  LYMPHSABS 0.6*  --  1.0 0.8  --   --   --   MONOABS 0.9  --  1.0 0.8  --   --   --   EOSABS 0.0  --  0.0 0.0  --   --   --   BASOSABS 0.0  --  0.0 0.0  --   --   --    < > = values in this interval not displayed.    Chemistries  Recent Labs  Lab 08/20/17 0428 08/21/17 0436  08/22/17 0425 08/23/17 0415 08/24/17 0500 08/25/17 0414 08/26/17 0340  NA 145 143   < > 140 139 142 138 139  K 3.3* 3.3*   < > 3.6 3.3* 2.8* 3.0* 3.8  CL 116* 107   < > 99 97* 101 100 103  CO2 20* 26   < > 28 29 28 28 27   GLUCOSE 136* 79   < > 70 90 78 206* 111*  BUN 15 9   < > 10 10 8  5* 10  CREATININE 0.38* 0.34*   < > 0.48 0.50 0.34* 0.47 0.43*  CALCIUM 7.9* 8.5*   < > 9.0 8.8* 8.4* 8.2* 8.4*  MG 1.8 1.7  --   --   --   --  1.7 2.3  AST  --   --   --   --   --   --  28 29  ALT  --   --   --   --   --   --  25 26  ALKPHOS  --   --   --   --   --   --  42 53  BILITOT  --   --   --   --   --   --  0.8 0.6   < > = values in this interval not displayed.   ------------------------------------------------------------------------------------------------------------------ No results for input(s): CHOL, HDL, LDLCALC, TRIG, CHOLHDL, LDLDIRECT in the last 72 hours.  Lab Results  Component Value Date   HGBA1C 4.7 (L) 06/10/2017    ------------------------------------------------------------------------------------------------------------------ No results for input(s): TSH, T4TOTAL, T3FREE, THYROIDAB in the last 72 hours.  Invalid input(s): FREET3 ------------------------------------------------------------------------------------------------------------------ Recent Labs    08/25/17 0414  VITAMINB12 1,377*  FOLATE 25.4    Coagulation profile No results for input(s): INR, PROTIME in the last 168 hours.  No results for input(s): DDIMER in the last 72 hours.  Cardiac Enzymes No results for input(s): CKMB, TROPONINI, MYOGLOBIN in the last 168 hours.  Invalid input(s): CK ------------------------------------------------------------------------------------------------------------------ No results found for: BNP   Roxan Hockey M.D on 08/26/2017 at 3:39 PM   Go to [email or web address] - password [email or web address] for contact info  [facility] - Office  [phone]

## 2017-08-26 NOTE — Progress Notes (Signed)
Daily Progress Note   Patient Name: [name] [name]       Date: 08/26/2017 DOB: Jun 03, 1940  Age: 77 y.o. MRN#: [id number] Attending Physician: Roxan Hockey, MD Primary Care Physician: [name], MD Admit Date: 08/17/2017  Reason for Consultation/Follow-up: Psychosocial/spiritual support  Subjective: Patient is resting in bed with DHT in place. No signs of distress. She is more alert today, but does not respond to questions with nodding or shaking her head today as she has been, and stares at wall. No family at bedside.    Length of Stay: 9  Current Medications: Scheduled Meds:  . chlorhexidine  15 mL Mouth Rinse BID  . Chlorhexidine Gluconate Cloth  6 each Topical Daily  . feeding supplement (PRO-STAT SUGAR FREE 64)  30 mL Per Tube Daily  . hydrALAZINE  25 mg Per Tube TID  . isosorbide dinitrate  5 mg Per Tube TID  . mouth rinse  15 mL Mouth Rinse q12n4p  . metoprolol tartrate  25 mg Per Tube TID PC  . mycophenolate  1,000 mg Per Tube BID  . pantoprazole sodium  40 mg Per Tube Daily  . potassium chloride  20 mEq Per Tube TID  . predniSONE  20 mg Per Tube BID  . sodium chloride flush  3 mL Intravenous Q12H    Continuous Infusions: . sodium chloride 10 mL/hr at 08/25/17 2000  . feeding supplement (OSMOLITE 1.2 CAL) 55 mL/hr at 08/25/17 2000    PRN Meds: acetaminophen (TYLENOL) oral liquid 160 mg/5 mL, hydrALAZINE, morphine injection, [DISCONTINUED] ondansetron **OR** ondansetron (ZOFRAN) IV, sodium chloride flush  Physical Exam  Constitutional: No distress.  Pulmonary/Chest: Effort normal.  Neurological: She is alert.  Skin: Skin is warm and dry.            Vital Signs: BP (!) 173/94 (BP Location: Right Arm)   Pulse 96   Temp 97.8 F (36.6 C) (Oral)   Resp 19    Ht 5\' 2"  (1.575 m)   Wt 65 kg   SpO2 96%   BMI 26.21 kg/m  SpO2: SpO2: 96 % O2 Device: O2 Device: Room Air O2 Flow Rate: O2 Flow Rate (L/min): 3 L/min  Intake/output summary:   Intake/Output Summary (Last 24 hours) at 08/26/2017 0932 Last data filed at 08/26/2017 0439 Gross per 24 hour  Intake  1124.83 ml  Output 950 ml  Net 174.83 ml   LBM: Last BM Date: 08/25/17 Baseline Weight: Weight: 63.5 kg Most recent weight: Weight: 65 kg       Palliative Assessment/Data: Tube feeds      Patient Active Problem List   Diagnosis Date Noted  . Acute upper GI bleed 08/17/2017  . Nontraumatic hemorrhagic shock ([place]) 08/17/2017  . Hyperlipidemia 07/26/2017  . Pressure ulcer of buttock 07/22/2017  . Neck mass 07/21/2017  . Neuropathic pain   . 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 Neurologic deficit due to acute ischemic stroke ([place])   . Hypokalemia   . CVA (cerebral vascular accident) ([place]) 06/09/2017  . Multiple pulmonary nodules 06/09/2017  . Hypothermia 10/22/2016  . Chronic diastolic CHF (congestive heart failure) ([place]) 10/22/2016  . Abdominal pain, unspecified site 05/17/2013  . Nausea alone 05/17/2013  . 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 Abdominal pain, left lower quadrant 05/28/2011  . Cecal ulcer with hemorrhage 01/10/2011  . Acute blood loss anemia 12/06/2010  . Anemia 12/05/2010  . Adrenal insufficiency ([place]) 12/05/2010  . Osteoporosis 12/05/2010  . LUNG INVOLVEMENT OTHER DISEASES CLASSIFIED ELSW 08/13/2009  . OTHER SPECIFIED DISORDER OF STOMACH AND DUODENUM 07/06/2008  . Essential hypertension 04/26/2007  . ALLERGIC RHINITIS 04/26/2007  . GASTROESOPHAGEAL REFLUX DISEASE 04/26/2007  . Polymyositis ([place]) 04/26/2007  . COLONIC POLYPS, ADENOMATOUS 02/06/2005    Recommendations/Plan:  Conversation with CCM 8/13.  Per conversation, plan to continue DHT with TF around 5 days and see how she does; plan for further  discussion with the family reagarding PEG placement or comfort care following this time.   Recommend hospice if family decides against PEG tube.  If family would like PEG and SNF, recommend palliative to follow at D/C.      Code Status:    Code Status Orders  (From admission, onward)         Start     Ordered   08/17/17 1148  Do not attempt resuscitation (DNR)  Continuous    Question Answer Comment  In the event of cardiac or respiratory ARREST Do not call a "code blue"   In the event of cardiac or respiratory ARREST Do not perform Intubation, CPR, defibrillation or ACLS   In the event of cardiac or respiratory ARREST Use medication by any route, position, wound care, and other measures to relive pain and suffering. May use oxygen, suction and manual treatment of airway obstruction as needed for comfort.      08/17/17 1148        Code Status History    Date Active Date Inactive Code Status Order ID Comments User Context   08/17/2017 0829 08/17/2017 1148 DNR [id number]  Nat Christen, MD ED   07/21/2017 1228 07/23/2017 1804 DNR [id number]  Karmen Bongo, MD ED   06/12/2017 1705 06/25/2017 1655 Full Code [id number]  Cathlyn Parsons, PA-C Inpatient   06/12/2017 1705 06/12/2017 1705 Full Code [id number]  Cathlyn Parsons, PA-C Inpatient   06/10/2017 0330 06/12/2017 1657 Full Code [id number]  Bethena Roys, MD Inpatient   10/22/2016 1955 10/24/2016 2009 Full Code [id number]  Vianne Bulls, MD ED       Prognosis:  Poor longterm.   Discharge Planning:  To Be Determined  Care plan was discussed with RN, CCM  Thank you for allowing the Palliative Medicine Team to assist in the care of this patient.   Total Time 15 min Prolonged Time  Billed  no       Greater than 50%  of this time was spent counseling and coordinating care related to the above assessment and plan.  Asencion Gowda, NP  Please contact Palliative Medicine Team phone at [phone] for questions and concerns.

## 2017-08-27 ENCOUNTER — Inpatient Hospital Stay (HOSPITAL_COMMUNITY): Payer: Medicare Other

## 2017-08-27 DIAGNOSIS — I6300 Cerebral infarction due to thrombosis of unspecified precerebral artery: Secondary | ICD-10-CM

## 2017-08-27 DIAGNOSIS — R55 Syncope and collapse: Secondary | ICD-10-CM

## 2017-08-27 LAB — GLUCOSE, CAPILLARY
GLUCOSE-CAPILLARY: 139 mg/dL — AB (ref 70–99)
GLUCOSE-CAPILLARY: 140 mg/dL — AB (ref 70–99)
GLUCOSE-CAPILLARY: 58 mg/dL — AB (ref 70–99)
Glucose-Capillary: 69 mg/dL — ABNORMAL LOW (ref 70–99)
Glucose-Capillary: 77 mg/dL (ref 70–99)
Glucose-Capillary: 112 mg/dL — ABNORMAL HIGH (ref 70–99)
Glucose-Capillary: 86 mg/dL (ref 70–99)
Glucose-Capillary: 109 mg/dL — ABNORMAL HIGH (ref 70–99)

## 2017-08-27 LAB — PREPARE RBC (CROSSMATCH)

## 2017-08-27 LAB — CBC
HEMATOCRIT: 29.4 % — AB (ref 36.0–46.0)
HEMOGLOBIN: 9.3 g/dL — AB (ref 12.0–15.0)
MCH: 26.3 pg (ref 26.0–34.0)
MCHC: 31.6 g/dL (ref 30.0–36.0)
MCV: 83.1 fL (ref 78.0–100.0)
Platelets: UNDETERMINED 10*3/uL (ref 150–400)
RBC: 3.54 MIL/uL — ABNORMAL LOW (ref 3.87–5.11)
RDW: 18.4 % — ABNORMAL HIGH (ref 11.5–15.5)
WBC: 11.3 10*3/uL — ABNORMAL HIGH (ref 4.0–10.5)

## 2017-08-27 LAB — HEMOGLOBIN AND HEMATOCRIT, BLOOD
HCT: 19.4 % — ABNORMAL LOW (ref 36.0–46.0)
HCT: 32.3 % — ABNORMAL LOW (ref 36.0–46.0)
Hemoglobin: 5.9 g/dL — CL (ref 12.0–15.0)
Hemoglobin: 10.1 g/dL — ABNORMAL LOW (ref 12.0–15.0)

## 2017-08-27 MED ORDER — SODIUM CHLORIDE 0.9% IV SOLUTION: Freq: Once | INTRAVENOUS | Status: AC

## 2017-08-27 NOTE — Consult Note (Signed)
Modified Barium Swallow Progress Note  Patient Details  Name: [name] [name] MRN: [id number] Date of Birth: 05/13/1940  Today's Date: 08/27/2017  Modified Barium Swallow completed.  Full report located under Chart Review in the Imaging Section.  Brief recommendations include the following:  Clinical Impression Pt presents with severe oral, moderate pharyngeal dysphagia, characterized as follows:   ORALLY, pt exhibits minimal labial movement/seal, significantly poor oral prep and propulsion, with anterior leakage, oral holding, and poor bolus formation noted across consistencies. Nectar thick liquid was suctioned from pt oral cavity, as no posterior propulsion or swallow occurred. Poor bolus control and posterior propulsion also noted with thin liquid and puree trials, with post-swallow oral residue seen.   PHARYNGEALLY, pt exhibits delayed swallow reflex, with trigger noted at the pyriform sinus. Aspiration of thin liquid via straw was noted during the swallow, with minimal and ineffective cough response elicited. No penetration or aspiration of puree consistency was noted, however, pt is at significantly high risk given poor oral prep and delay of swallow reflex/airway closure.   At this time, strict NPO status is recommended for primary nutrition, hydration, and medications.   When alert and participatory, pt may benefit from therapeutic trials of individual ice chips and small boluses of puree during dysphagia therapy with speech pathology only. SLP will follow for trial of therapy and education. RN and MD informed of results and recommendations.   Swallow Evaluation Recommendations  SLP Diet Recommendations: NPO;Alternative means - long-term;Other (Comment)(ice chips and puree with SPEECH THERAPY ONLY)   Medication Administration: Via alternative means   Oral Care Recommendations: Oral care QID(with suction)   Other Recommendations: Have oral suction available  Celia B. Quentin Ore  [facility], [place] Speech Language Pathologist [phone]  Shonna Chock 08/27/2017,4:19 PM

## 2017-08-27 NOTE — Care Management (Signed)
Case manager received call from patient's son requesting to have mom's DNR order canceled until the and his siblings can discuss it. Case manager and Mr. Andarrell Duplessis  Discussed at length DNR versus full code status. He says he wants to give her a chance. CM informed him that Dr. Lynnda Child be contacted and asked to call him. His cell:[phone], Home: [phone]. Case manager contacted Dr. Lunette Stands and updated her on son's request. Per PalliativeTeam note, they have a family meeting scheduled for Sunday, 08/30/17.

## 2017-08-27 NOTE — Progress Notes (Signed)
Nutrition Follow-up  DOCUMENTATION CODES:   Not applicable  INTERVENTION:   Tube Feeding:  Continue Osmolite 1.2 @ 55 ml/hr Continue Pro-Stat 30 mL daily  Await results of MBS study; further recommendations to follow   NUTRITION DIAGNOSIS:   Inadequate oral intake related to acute illness as evidenced by NPO status.  Being addressed via TF   GOAL:   Patient will meet greater than or equal to 90% of their needs  Met  MONITOR:   TF tolerance, Vent status, Labs, Weight trends  REASON FOR ASSESSMENT:   Ventilator    ASSESSMENT:   77 yo female admitted with acute GI bleed, encephalopathy, pneumothorax s/p chest tube placement. Pt with hx of CVA, polymyositis, gastric leiomyoma, HTN, GERD, CHF. At baseline, pt is total care, wheelchair bound, chronic UE muscle weakness related to polymyositis. Pt is unable to feed self.   Plan for MBS per SLP  Cortrak tube remains in place Osmolite 1.2 infusing at 45 ml/hr; goal rate 55 ml/hr. Discussed with RN. No documentation of pt not tolerating TF; pt is not aware of any intolerance issues. RN to increase TF to goal rate of 55 ml/hr  Weight relatively stable since admission; current wt 65 kg. Noted fluid +status per I/O flowsheet; may indicate some weight loss.   Labs: CBGs 58-140 Meds: prednisone, KCl   Diet Order:   Diet Order    None      EDUCATION NEEDS:   Not appropriate for education at this time  Skin:  Skin Assessment: Skin Integrity Issues: Skin Integrity Issues:: Other (Comment) Other: MASD: groin  Last BM:  8/15  Height:   Ht Readings from Last 1 Encounters:  08/17/17 5' 2"  (1.575 m)    Weight:   Wt Readings from Last 1 Encounters:  08/26/17 65 kg    Ideal Body Weight:     BMI:  Body mass index is 26.21 kg/m.  Estimated Nutritional Needs:   Kcal:  1600-1800 kcals   Protein:  80-90 g  Fluid:  >/= 1.6 L   Kerman Passey MS, RD, LDN, CNSC [phone] Pager  [phone]  Weekend/On-Call Pager

## 2017-08-27 NOTE — Care Management Important Message (Signed)
Important Message  Patient Details  Name: [name] [name] MRN: [id number] Date of Birth: 31-Oct-1940   Medicare Important Message Given:  Yes    [name] Montine Circle 08/27/2017, 9:32 AM

## 2017-08-27 NOTE — Progress Notes (Addendum)
Daily Progress Note   Patient Name: [name] [name]       Date: 08/27/2017 DOB: [birth year]  Age: 77 y.o. MRN#: [id number] Attending Physician: Monica Becton, MD Primary Care Physician: [name], MD Admit Date: 08/17/2017  Reason for Consultation/Follow-up: Psychosocial/spiritual support  Subjective: Patient is resting in bed with DHT in place. No signs of distress. She nods her head inappropriately yes that Silvio Pate is the president, and yes that she is at her home. Daughter at bedside. She states family will want to discuss further planning for their mother. Family meeting Sunday at 12:00.    Length of Stay: 10  Current Medications: Scheduled Meds:  . sodium chloride   Intravenous Once  . chlorhexidine  15 mL Mouth Rinse BID  . Chlorhexidine Gluconate Cloth  6 each Topical Daily  . feeding supplement (PRO-STAT SUGAR FREE 64)  30 mL Per Tube Daily  . hydrALAZINE  25 mg Per Tube TID  . isosorbide dinitrate  5 mg Per Tube TID  . mouth rinse  15 mL Mouth Rinse q12n4p  . metoprolol tartrate  25 mg Per Tube TID PC  . mycophenolate  1,000 mg Per Tube BID  . pantoprazole sodium  40 mg Per Tube Daily  . potassium chloride  20 mEq Per Tube TID  . predniSONE  20 mg Per Tube BID  . sodium chloride flush  3 mL Intravenous Q12H    Continuous Infusions: . sodium chloride 10 mL/hr at 08/25/17 2000  . feeding supplement (OSMOLITE 1.2 CAL) 1,000 mL (08/27/17 1329)    PRN Meds: acetaminophen (TYLENOL) oral liquid 160 mg/5 mL, hydrALAZINE, morphine injection, [DISCONTINUED] ondansetron **OR** ondansetron (ZOFRAN) IV, sodium chloride flush  Physical Exam  Constitutional: No distress.  Pulmonary/Chest: Effort normal.  Neurological: She is alert.  Skin: Skin is warm and dry.             Vital Signs: BP (!) 145/67   Pulse 76   Temp 97.8 F (36.6 C) (Oral)   Resp 18   Ht 5\' 2"  (1.575 m)   Wt 65 kg   SpO2 96%   BMI 26.21 kg/m  SpO2: SpO2: 96 % O2 Device: O2 Device: Room Air O2 Flow Rate: O2 Flow Rate (L/min): 3 L/min  Intake/output summary:   Intake/Output Summary (Last 24 hours) at 08/27/2017 1621  Last data filed at 08/27/2017 1517 Gross per 24 hour  Intake 2167.5 ml  Output 3653 ml  Net -1485.5 ml   LBM: Last BM Date: 08/27/17 Baseline Weight: Weight: 63.5 kg Most recent weight: Weight: 65 kg       Palliative Assessment/Data: Tube feeds      Patient Active Problem List   Diagnosis Date Noted  . Acute upper GI bleed 08/17/2017  . Nontraumatic hemorrhagic shock ([place]) 08/17/2017  . Hyperlipidemia 07/26/2017  . Pressure ulcer of buttock 07/22/2017  . Neck mass 07/21/2017  . Neuropathic pain   . 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 Neurologic deficit due to acute ischemic stroke ([place])   . Hypokalemia   . CVA (cerebral vascular accident) ([place]) 06/09/2017  . Multiple pulmonary nodules 06/09/2017  . Hypothermia 10/22/2016  . Chronic diastolic CHF (congestive heart failure) ([place]) 10/22/2016  . Abdominal pain, unspecified site 05/17/2013  . Nausea alone 05/17/2013  . 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 Abdominal pain, left lower quadrant 05/28/2011  . Cecal ulcer with hemorrhage 01/10/2011  . Acute blood loss anemia 12/06/2010  . Anemia 12/05/2010  . Adrenal insufficiency ([place]) 12/05/2010  . Osteoporosis 12/05/2010  . LUNG INVOLVEMENT OTHER DISEASES CLASSIFIED ELSW 08/13/2009  . OTHER SPECIFIED DISORDER OF STOMACH AND DUODENUM 07/06/2008  . Essential hypertension 04/26/2007  . ALLERGIC RHINITIS 04/26/2007  . GASTROESOPHAGEAL REFLUX DISEASE 04/26/2007  . Polymyositis ([place]) 04/26/2007  . COLONIC POLYPS, ADENOMATOUS 02/06/2005    Recommendations/Plan:  Conversation with CCM 8/13.  Per  conversation, plan to continue DHT with TF around 5 days and see how she does; plan for further discussion with the family reagarding PEG placement or comfort care following this time. As of today, 8/15, family would like more time to consider. Meeting Sunday at 12:00  Recommend hospice if family decides against PEG tube.  If family would like PEG and SNF, recommend palliative to follow at D/C.      Code Status:    Code Status Orders  (From admission, onward)         Start     Ordered   08/17/17 1148  Do not attempt resuscitation (DNR)  Continuous    Question Answer Comment  In the event of cardiac or respiratory ARREST Do not call a "code blue"   In the event of cardiac or respiratory ARREST Do not perform Intubation, CPR, defibrillation or ACLS   In the event of cardiac or respiratory ARREST Use medication by any route, position, wound care, and other measures to relive pain and suffering. May use oxygen, suction and manual treatment of airway obstruction as needed for comfort.      08 /05/19 1148        Code Status History    Date Active Date Inactive Code Status Order ID Comments User Context   08/17/2017 0829 08/17/2017 1148 DNR [id number]  Nat Christen, MD ED   07/21/2017 1228 07/23/2017 1804 DNR [id number]  Karmen Bongo, MD ED   06/12/2017 1705 06/25/2017 1655 Full Code [id number]  Cathlyn Parsons, PA-C Inpatient   06/12/2017 1705 06/12/2017 1705 Full Code [id number]  Cathlyn Parsons, PA-C Inpatient   06/10/2017 0330 06/12/2017 1657 Full Code [id number]  Bethena Roys, MD Inpatient   10/22/2016 1955 10/24/2016 2009 Full Code [id number]  Vianne Bulls, MD ED       Prognosis:  Poor longterm.   Discharge Planning:  To Be Determined  Care plan was discussed with RN,  CCM  Thank you for allowing the Palliative Medicine Team to assist in the care of this patient.   Total Time 15 min Prolonged Time Billed  no       Greater than 50%  of this time was spent  counseling and coordinating care related to the above assessment and plan.  Asencion Gowda, NP  Please contact Palliative Medicine Team phone at [phone] for questions and concerns.

## 2017-08-27 NOTE — Progress Notes (Signed)
Patient Demographics:    [name] [name], is a 77 y.o. female, DOB - June 24, 1940, UUE:[id number]  Admit date - 08/17/2017   Admitting Physician [name], MD  Outpatient Primary MD for the patient is [name], MD  LOS - 10   Chief Complaint  Patient presents with  . Rectal Bleeding        Subjective:   Per nursing staff patient had 2 episodes of bloody stools overnight.  Also had small blood-tinged stool this morning.  Patient is tolerating the tube feeds well.  Followed simple commands. Unsure why patient is receiving packed RBC today given the patient's hemoglobin has been stable  Assessment  & Plan :    Active Problems:   Polymyositis (HCC)   Adrenal insufficiency (HCC)   Acute blood loss anemia   CVA (cerebral vascular accident) ([place])   Acute upper GI bleed   Nontraumatic hemorrhagic shock ([place])   Brief Narrative:  76yo female SNF resident with a hx of polymyositis, CVA with complete right hemiparesis, contracted, gastric leiomyoma, hypertension, GERD, and diastolic CHF who was brought to the ED w/ bright red blood per rectum. She had episodes of hypotension and unresponsiveness in the ED and required intubation. She was transfused 2 unit of PRBC transfusion. Chest x-ray post intubation showed left pneumothorax. She was extubated on 8/8.  Gastroenterology was consulted, underwent EGD.  CT abdomen and pelvis without any acute abnormality.  Aspirin Plavix have been on hold.  Considering patient being contracted, no further endoscopic work-up is planned.  Significant Events: 8/5 admit - intubated in ED - L sided chest tube placed  8/8 extubated      Assessment & Plan:  ##Hematochezia - Hemorrhagic shock--- resolved hemorrhagic shock, one episode of BM with blood on 08/26/2017,  -H&H has been stable, the globin on 08/26/2017 is 9.5, continue to monitor H&H and transfuse as clinically  indicated - EGD on 08/17/17 by Dr Lyndel Safe negative  - s/p 2U PRBC  - CT abdom/pelvis w/o acute findings  - cont to holdAspirin, Plavix  - diverticular v/s recurrent colonic AVM - no further endoscopic work-up planned -Patient continues to have bloody stools, closely follow-up with CBC      ## Acute hypoxic respiratory failure  - Pulmonary edema / B pleural effusions - Aspiration pneumonia --- improved from a respiratory standpoint, Intubated 08/17/17 and Extubated on 08/20/17  - resolved w/ pt now on RA - off abx    ##Dysphagia - Has failed numerous swallow studies  - family agreed to have Cortrak placed 08/24/17 after 3 failed attempts at NG placement 08/23/17  - tolerating Cortrak and tube feeding well thus far - d/w speech pathologist Ms. Buechet on 08/26/2017, at this time patient's son Evangeline Gula states that they are not ready to make a decision on comfort measures versus PEG tube placement yet they want to continue to feed through Dobbhoff tube /Cortrax for now. -Palliative care has been consulted, plan to continue the speech therapy, if no improvement, if family does not agree for PEG tube consider hospice care  ##Acute encephalopathy metabolic - multi-factorial, ikely due to hypoperfusion, aspiration pneumonia, + Hx of CVA - head CT noted mild diffuse cerebral and cerebellar atrophy,marked chronic white matter ischemic change  - at  baseline patient was able to hold a conversation, withno memory deficits or dementia per son  - B12 and folate not low - avoid sedatives -Currently following simple commands  ##Hypertension --improved BP control, hydralazine 25 mg 3 times daily, continue metoprolol 25 mg 3 times daily, along with isosorbide 5 mg 3 times daily,   may use IV Hydralazine 10 mg  Every 4 hours Prn for systolic blood pressure over 160 mmhg -Well-controlled  ##Left-sided pneumothorax - chest tube removed - resolved - felt to be related to intubation/positive pressure  ventilation   ##Polymyositis - Chronic adrenal insufficiency - OnCellCept and prednisone - follows with Rheumatologist (Dr. 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 SNF  ##FEN--continue Osmolite 1.2 tube feed at 25 mL an hour, titrate  DVT prophylaxis: SCDs (Gi Bleed) Code Status: DNR - NO CODE Family Communication:   Attempted to call son [place].  No response Disposition Plan: TBD  Consultants:  PCCM GI Palliative Speech  Antimicrobials:  Unasyn 8/7 > 8/13   Lab Results  Component Value Date   PLT PLATELET CLUMPS NOTED ON SMEAR, UNABLE TO ESTIMATE 08/27/2017    Inpatient Medications  Scheduled Meds: . sodium chloride   Intravenous Once  . chlorhexidine  15 mL Mouth Rinse BID  . Chlorhexidine Gluconate Cloth  6 each Topical Daily  . feeding supplement (PRO-STAT SUGAR FREE 64)  30 mL Per Tube Daily  . hydrALAZINE  25 mg Per Tube TID  . isosorbide dinitrate  5 mg Per Tube TID  . mouth rinse  15 mL Mouth Rinse q12n4p  . metoprolol tartrate  25 mg Per Tube TID PC  . mycophenolate  1,000 mg Per Tube BID  . pantoprazole sodium  40 mg Per Tube Daily  . potassium chloride  20 mEq Per Tube TID  . predniSONE  20 mg Per Tube BID  . sodium chloride flush  3 mL Intravenous Q12H   Continuous Infusions: . sodium chloride 10 mL/hr at 08/25/17 2000  . feeding supplement (OSMOLITE 1.2 CAL) 1,000 mL (08/27/17 1329)   PRN Meds:.acetaminophen (TYLENOL) oral liquid 160 mg/5 mL, hydrALAZINE, morphine injection, [DISCONTINUED] ondansetron **OR** ondansetron (ZOFRAN) IV, sodium  chloride flush  Anti-infectives (From admission, onward)   Start     Dose/Rate Route Frequency Ordered Stop   08/19/17 1400  Ampicillin-Sulbactam (UNASYN) 3 g in sodium chloride 0.9 % 100 mL IVPB  Status:  Discontinued     3 g 200 mL/hr over 30 Minutes Intravenous Every 8 hours 08/19/17 1103 08/25/17 1220   08/18/17 1000  piperacillin-tazobactam (ZOSYN) IVPB 3.375 g  Status:  Discontinued     3.375 g 12.5 mL/hr over 240 Minutes Intravenous Every 8 hours 08/18/17 0512 08/19/17 1103   08/18/17 0515  piperacillin-tazobactam (ZOSYN) IVPB 3.375 g     3.375 g 100 mL/hr over 30 Minutes Intravenous  Once 08/18/17 0512 08/18/17 0631        Objective:   Vitals:   08/27/17 0727 08/27/17 0815 08/27/17 0931 08/27/17 1237  BP: 124/63 126/64 (!) 144/71 (!) 145/67  Pulse: 79 80 86 76  Resp: 16  16 20 18   Temp: 98.7 F (37.1 C) 98.6 F (37 C) 97.8 F (36.6 C) 97.8 F (36.6 C)  TempSrc: Axillary Axillary Oral Oral  SpO2: 96% 97% 96%   Weight:      Height:        Wt Readings from Last 3 Encounters:  08/26/17 65 kg  07/24/17 65.3 kg  07/21/17 65.5 kg     Intake/Output Summary (Last 24 hours) at 08/27/2017 1331 Last data filed at 08/27/2017 1228 Gross per 24 hour  Intake 1687.5 ml  Output 3653 ml  Net -1965.5 ml     Physical Exam  Gen:- Awake Alert,  In no apparent distress , nonverbal, HEENT:- [place].AT, No sclera icterus Nose-contrax tube with tube feeding running Neck-Supple Neck,No JVD,.  Lungs-diminished in bases, no wheezing CV- S1, S2 normal, right-sided Port-A-Cath Abd-  +ve B.Sounds, Abd Soft, No tenderness,    Extremity/Skin:- No  edema,   warm and dry Psych-Limited psych exam due to patient being nonverbal and unable to follow commands or instructions,  neuro-no new focal deficits, no tremors (at baseline apparently patient is mostly bedbound) GU- Foley   Data Review:   Micro Results Recent Results (from the past 240 hour(s))  Culture, respiratory (non-expectorated)      Status: None   Collection Time: 08/17/17  8:30 PM  Result Value Ref Range Status   Specimen Description TRACHEAL ASPIRATE  Final   Special Requests NONE  Final   Gram Stain   Final    ABUNDANT WBC PRESENT, PREDOMINANTLY PMN ABUNDANT GRAM POSITIVE COCCI RARE GRAM POSITIVE RODS    Culture   Final    Consistent with normal respiratory flora. Performed at [facility], [place] [address], [place], [place] [address]    Report Status 08/20/2017 FINAL  Final  Culture, blood (routine x 2)     Status: None   Collection Time: 08/17/17  9:33 PM  Result Value Ref Range Status   Specimen Description BLOOD RIGHT HAND  Final   Special Requests   Final    BOTTLES DRAWN AEROBIC ONLY Blood Culture results may not be optimal due to an inadequate volume of blood received in culture bottles   Culture   Final    NO GROWTH 5 DAYS Performed at [facility], [place] [address], [place], [place] [address]    Report Status 08/22/2017 FINAL  Final  Body fluid culture     Status: None   Collection Time: 08/18/17 10:44 AM  Result Value Ref Range Status   Specimen Description FLUID PLEURAL LEFT  Final   Special Requests Immunocompromised  Final   Gram Stain   Final    ABUNDANT WBC PRESENT, PREDOMINANTLY PMN NO ORGANISMS SEEN    Culture   Final    NO GROWTH 3 DAYS Performed at [facility], [place] [address] [place], [place] [address]    Report Status 08/21/2017 FINAL  Final  Culture, respiratory     Status: None   Collection Time: 08/18/17 11:29 AM  Result Value Ref Range Status   Specimen Description TRACHEAL ASPIRATE  Final   Special Requests NONE  Final   Gram Stain   Final    FEW WBC PRESENT, PREDOMINANTLY PMN RARE GRAM POSITIVE COCCI    Culture   Final    Consistent with normal respiratory flora. Performed at [facility], [place] [address], [place], [place] [address]    Report Status 08/20/2017 FINAL  Final  Radiology Reports Ct Angio Head W Or Wo Contrast  Result  Date: 08/17/2017 CLINICAL DATA:  Focal neuro deficit. Altered mental status. Periods of unresponsiveness EXAM: CT ANGIOGRAPHY HEAD AND NECK TECHNIQUE: Multidetector CT imaging of the head and neck was performed using the standard protocol during bolus administration of intravenous contrast. Multiplanar CT image reconstructions and MIPs were obtained to evaluate the vascular anatomy. Carotid stenosis measurements (when applicable) are obtained utilizing NASCET criteria, using the distal internal carotid diameter as the denominator. CONTRAST:  166mL ISOVUE-370 IOPAMIDOL (ISOVUE-370) INJECTION 76% COMPARISON:  Noncontrast head CT earlier today. CTA head neck 06/09/2017 FINDINGS: CTA NECK FINDINGS Aortic arch: Normal.  Three vessel branching. Right carotid system: Limited by motion at the distal ICA. Visible vessels are smooth and widely patent. Left carotid system: Limited by motion at the distal left ICA. Visible vessels are smooth and widely patent. Vertebral arteries: No proximal subclavian flow limiting stenosis. Both vertebral arteries are patent in the neck. Skeleton: No acute finding Other neck: No acute finding. Upper chest: Porta catheter on the right.  No acute finding. Review of the MIP images confirms the above findings CTA HEAD FINDINGS Anterior circulation: No evidence of emergent large vessel occlusion or proximal flow limiting stenosis atherosclerotic narrowing and irregularity of medium size vessels (M2 level and beyond) that is extensive, see markings on MIP images. Negative for aneurysm. Posterior circulation: The vertebral and basilar arteries are smooth and widely patent. Symmetric flow in bilateral branches. Atherosclerotic irregularity of left more than right PCA branches. Negative for aneurysm Venous sinuses: Patent Anatomic variants: None significant Delayed phase: Not obtained Review of the MIP images confirms the above findings IMPRESSION: 1. No emergent large vessel occlusion or other acute  finding. Stable from 06/09/2017. 2. Extensive atherosclerotic irregularity and narrowing of intracranial medium size vessels. 3. No significant atherosclerosis or stenosis in the neck. Electronically Signed   By: Monte Fantasia M.D.   On: 08/17/2017 11:31   Dg Chest 1 View  Result Date: 08/22/2017 CLINICAL DATA:  Hypoxia, altered mental status EXAM: CHEST  1 VIEW COMPARISON:  the previous day's study FINDINGS: The left pigtail drain catheter is been removed. No pneumothorax evident. Blunting of the left lateral costophrenic angle suggesting small effusion. Stable right IJ port catheter. Atelectasis/infiltrate in the lung bases left greater than right, improved since prior study. Heart size upper limits normal for technique. Aortic Atherosclerosis (ICD10-170.0). Regional bones unremarkable. IMPRESSION: 1. Interval removal of left chest tube with no pneumothorax. 2. Possible small left pleural effusion. 3. Bibasilar atelectasis/consolidation, improved since previous exam Electronically Signed   By: Lucrezia Europe M.D.   On: 08/22/2017 10:04   Ct Head Wo Contrast  Result Date: 08/19/2017 CLINICAL DATA:  Altered mental status.  Cephalopathy. EXAM: CT HEAD WITHOUT CONTRAST TECHNIQUE: Contiguous axial images were obtained from the base of the skull through the vertex without intravenous contrast. COMPARISON:  08/17/2017. FINDINGS: Brain: Diffusely enlarged ventricles and subarachnoid spaces. Patchy white matter low density in both cerebral hemispheres. Streak artifacts from a high density lead attached to the right frontal bone. No intracranial hemorrhage, mass lesion or CT evidence of acute infarction. Vascular: No hyperdense vessel or unexpected calcification. Skull: Normal. Negative for fracture or focal lesion. Sinuses/Orbits: Unremarkable. Other: None. IMPRESSION: 1. No acute abnormality. 2. Stable mild diffuse cerebral and cerebellar atrophy. 3. Stable marked chronic small vessel white matter ischemic changes in  both cerebral hemispheres. Electronically Signed   By: Claudie Revering M.D.   On: 08/19/2017 13:50  Ct Head Wo Contrast  Result Date: 08/17/2017 CLINICAL DATA:  Bilateral arm weakness EXAM: CT HEAD WITHOUT CONTRAST TECHNIQUE: Contiguous axial images were obtained from the base of the skull through the vertex without intravenous contrast. COMPARISON:  06/09/2017 brain MRI FINDINGS: Brain: Low-density in the posterior left frontal subcortical white matter is in close approximation to infarcts seen on 06/09/2017 brain MRI. There is a background of advanced chronic small vessel ischemia with confluent low-density in the cerebral white matter. Indistinct deep gray nuclei from chronic ischemic injury, including discrete remote lacunar infarct in the right thalamus. No evident hemorrhage, mass, or hydrocephalus. Subcentimeter left parietal inner table calcification considered incidental. Vascular: No hyperdense vessel.  Atherosclerotic calcification. Skull: No evidence of fracture or bone lesion. Sinuses/Orbits: No pathologic finding. Bilateral cataract resection. Other: Motion degraded scan, best obtainable due to patient condition. IMPRESSION: 1. Motion degraded study without definite acute finding. 2. A small left cerebral white matter infarct was likely present on 06/09/2017 brain MRI. 3. Severe chronic small vessel ischemia. Electronically Signed   By: Monte Fantasia M.D.   On: 08/17/2017 09:19   Ct Angio Neck W Or Wo Contrast  Result Date: 08/17/2017 CLINICAL DATA:  Focal neuro deficit. Altered mental status. Periods of unresponsiveness EXAM: CT ANGIOGRAPHY HEAD AND NECK TECHNIQUE: Multidetector CT imaging of the head and neck was performed using the standard protocol during bolus administration of intravenous contrast. Multiplanar CT image reconstructions and MIPs were obtained to evaluate the vascular anatomy. Carotid stenosis measurements (when applicable) are obtained utilizing NASCET criteria, using the  distal internal carotid diameter as the denominator. CONTRAST:  173mL ISOVUE-370 IOPAMIDOL (ISOVUE-370) INJECTION 76% COMPARISON:  Noncontrast head CT earlier today. CTA head neck 06/09/2017 FINDINGS: CTA NECK FINDINGS Aortic arch: Normal.  Three vessel branching. Right carotid system: Limited by motion at the distal ICA. Visible vessels are smooth and widely patent. Left carotid system: Limited by motion at the distal left ICA. Visible vessels are smooth and widely patent. Vertebral arteries: No proximal subclavian flow limiting stenosis. Both vertebral arteries are patent in the neck. Skeleton: No acute finding Other neck: No acute finding. Upper chest: Porta catheter on the right.  No acute finding. Review of the MIP images confirms the above findings CTA HEAD FINDINGS Anterior circulation: No evidence of emergent large vessel occlusion or proximal flow limiting stenosis atherosclerotic narrowing and irregularity of medium size vessels (M2 level and beyond) that is extensive, see markings on MIP images. Negative for aneurysm. Posterior circulation: The vertebral and basilar arteries are smooth and widely patent. Symmetric flow in bilateral branches. Atherosclerotic irregularity of left more than right PCA branches. Negative for aneurysm Venous sinuses: Patent Anatomic variants: None significant Delayed phase: Not obtained Review of the MIP images confirms the above findings IMPRESSION: 1. No emergent large vessel occlusion or other acute finding. Stable from 06/09/2017. 2. Extensive atherosclerotic irregularity and narrowing of intracranial medium size vessels. 3. No significant atherosclerosis or stenosis in the neck. Electronically Signed   By: Monte Fantasia M.D.   On: 08/17/2017 11:31   Dg Chest Port 1 View  Result Date: 08/27/2017 CLINICAL DATA:  Dyspnea. EXAM: PORTABLE CHEST 1 VIEW COMPARISON:  Radiograph of August 22, 2017. FINDINGS: Stable cardiomegaly. Right internal jugular Port-A-Cath is unchanged  in position. No pneumothorax is noted. Elevated right hemidiaphragm is noted with mild right basilar subsegmental atelectasis. Bony thorax is unremarkable. Feeding tube is seen entering the stomach. Bony thorax is unremarkable. IMPRESSION: Stable cardiomegaly. Elevated right hemidiaphragm is noted with mild  right basilar subsegmental atelectasis. Interval placement of feeding tube seen entering stomach. Electronically Signed   By: Marijo Conception, M.D.   On: 08/27/2017 09:20   Dg Chest Port 1 View  Result Date: 08/21/2017 CLINICAL DATA:  History of pneumothorax.  Chest tube in place. EXAM: PORTABLE CHEST 1 VIEW COMPARISON:  August 20, 2016 FINDINGS: No pneumothorax. The left chest tube is stable. A layering effusion with underlying opacity in seen on the right. Probable edema. No nodules or masses. Stable cardiomediastinal silhouette. Stable right Port-A-Cath. IMPRESSION: 1. Left-sided chest tube.  No pneumothorax. 2. Layering effusions bilaterally, right greater than left underlying atelectasis. 3. Pulmonary edema. Electronically Signed   By: Dorise Bullion III M.D   On: 08/21/2017 09:49   Dg Chest Port 1 View  Result Date: 08/20/2017 CLINICAL DATA:  Assess for pneumothorax. EXAM: PORTABLE CHEST 1 VIEW COMPARISON:  Chest radiograph performed earlier today at 4:32 a.m. FINDINGS: A right-sided chest port is noted ending about the mid SVC. Small bilateral pleural effusions are noted. Increased interstitial markings raise concern for pulmonary edema, worsened from the prior study. No pneumothorax is seen. A left chest pigtail catheter is noted in essentially unchanged position. The cardiomediastinal silhouette is borderline normal in size. No acute osseous abnormalities are seen. IMPRESSION: 1. No pneumothorax seen. Left chest pigtail catheter noted in essentially unchanged position. 2. Small bilateral pleural effusions noted. Increased interstitial markings raise concern for pulmonary edema, worsened from the  prior study. Electronically Signed   By: Garald Balding M.D.   On: 08/20/2017 22:50   Dg Chest Port 1 View  Result Date: 08/20/2017 CLINICAL DATA:  Respiratory failure. EXAM: PORTABLE CHEST 1 VIEW COMPARISON:  08/19/2017 FINDINGS: Endotracheal tube remains present with the tip approximately 3 cm above the carina. Stable positioning pigtail chest tube on the left and right-sided Port-A-Cath. Gastric decompression tube extends below the diaphragm. Lungs show slight increase in right basilar atelectasis. The left lower lobe shows improved aeration. No overt edema, evidence of pneumothorax or pleural fluid. IMPRESSION: Improved aeration of the left lower lung. Slight increase in right basilar atelectasis. Electronically Signed   By: Aletta Edouard M.D.   On: 08/20/2017 11:08   Dg Chest Port 1 View  Result Date: 08/19/2017 CLINICAL DATA:  Endotracheal tube is assessment EXAM: PORTABLE CHEST 1 VIEW COMPARISON:  August 18, 2017 FINDINGS: The heart size and mediastinal contours are stable. Endotracheal tube is identified with distal tip 4.5 cm from carina in good position. A nasogastric tube is identified with distal tip not included on film but is at least in the stomach. A right central venous line is identified with distal tip in the superior vena cava, unchanged. Left chest tube is unchanged. There is pulmonary vascular congestion. Probable left pleural effusion is identified. Consolidation of left lung base is unchanged. The visualized skeletal structures are stable. IMPRESSION: Endotracheal tube is identified with distal tip 4.5 cm from carina, in good position. Pulmonary vascular congestion of the lungs. There is probable left pleural effusion with consolidation of left lung base unchanged compared prior exam. Electronically Signed   By: Abelardo Diesel M.D.   On: 08/19/2017 09:32   Dg Chest Port 1 View  Result Date: 08/18/2017 CLINICAL DATA:  Pneumothorax image timed for 1100hrs.  OG placement. EXAM: PORTABLE  CHEST 1 VIEW COMPARISON:  08/18/2017 FINDINGS: Endotracheal tube is in place with tip approximately 3.5 centimeters above the carina. An orogastric tube is in place, tip off the image beyond the gastroesophageal junction.  The patient has a RIGHT-sided Port-A-Cath with tip to level of superior vena cava. The patient has a LEFT-sided small bore chest tube. There is no pneumothorax. The heart is enlarged. Patient is rotated towards the LEFT. There is dense opacity at the LEFT lung base which obscures the LEFT hemidiaphragm. Mild subsegmental atelectasis in the RIGHT LOWER lobe, stable in appearance. IMPRESSION: 1. No pneumothorax. 2. Persistent dense LEFT LOWER lobe opacity. 3. Orogastric tube tip beyond the gastroesophageal junction. Electronically Signed   By: Nolon Nations M.D.   On: 08/18/2017 11:31   Dg Chest Port 1 View  Result Date: 08/18/2017 CLINICAL DATA:  Pneumothorax.  Shortness of breath. EXAM: PORTABLE CHEST 1 VIEW COMPARISON:  One-view chest x-ray 08/17/2017. FINDINGS: Endotracheal tube is stable. Right IJ catheter is stable. A left-sided chest tube is in place. No visible pneumothorax is present. Basilar atelectasis is present. Mild pulmonary vascular congestion is noted. IMPRESSION: 1. No significant pneumothorax with left-sided chest tube in place. 2. Left basilar airspace opacity likely representing small effusion and atelectasis. 3. Support apparatus is stable. Electronically Signed   By: San Morelle M.D.   On: 08/18/2017 07:07   Dg Chest Port 1 View  Result Date: 08/17/2017 CLINICAL DATA:  77 year old female with history of chest tube in the left side. EXAM: PORTABLE CHEST 1 VIEW COMPARISON:  Chest x-ray 08/17/2017. FINDINGS: Compared to the prior examination there has been interval placement of a small bore left chest tube with tip in the apex of the left hemithorax. Previously noted left-sided pneumothorax is significantly decreased in size compared to the prior examination, now  likely less than 5-10% of the volume of the left hemithorax. Right internal jugular single-lumen porta cath with tip terminating near the superior cavoatrial junction. Patient is intubated, with the tip of the endotracheal tube approximately 3 cm above the carina. Lung volumes are low. Persistent elevation of the right hemidiaphragm. Bibasilar linear opacities and linear opacities in the left mid lung, likely to reflect areas of subsegmental atelectasis and/or scarring. No definite consolidative airspace disease. No pleural effusions. No evidence of pulmonary edema. Heart size appears borderline enlarged. The patient is rotated to the left on today's exam, resulting in distortion of the mediastinal contours and reduced diagnostic sensitivity and specificity for mediastinal pathology. Aortic atherosclerosis. IMPRESSION: 1. Support apparatus, as above. 2. Regression of previously noted left-sided pneumothorax following chest tube placement, now extremely small. 3. Low lung volumes with bibasilar areas of subsegmental atelectasis and/or scarring. Electronically Signed   By: Vinnie Langton M.D.   On: 08/17/2017 20:41   Dg Chest Port 1 View  Result Date: 08/17/2017 CLINICAL DATA:  Difficult intubation.  Orogastric tube placement. EXAM: PORTABLE CHEST 1 VIEW COMPARISON:  Portable chest x-ray of Jun 09, 2017 FINDINGS: The endotracheal tube tip projects 1-2 cm above the carina. There is considerable subcutaneous emphysema however. There is a 10% or less left pneumothorax. There is subsegmental atelectasis in the right lower lung and in the left mid lung. There is subcutaneous emphysema in the left axillary region and bilaterally at the base of the neck. The heart is normal in size. The pulmonary vascularity is not engorged. The porta catheter tip projects over the midportion of the SVC. No esophagogastric tube is observed. IMPRESSION: There is a 10% or less left sided pneumothorax. There is a large amount of  subcutaneous emphysema and pneumomediastinum and a small amount of pneumopericardium. The endotracheal tube tip projects approximately 1-2 cm above the carina. Subsegmental atelectasis in both  lungs. No CHF.  There is calcification in the wall of the aortic arch. These results will be called to the ordering clinician or representative by the Radiologist Assistant, and communication documented in the PACS or zVision Dashboard. Electronically Signed   By: David  Martinique M.D.   On: 08/17/2017 14:37   Dg Abd Portable 1v  Result Date: 08/23/2017 CLINICAL DATA:  Nasogastric tube placement. EXAM: PORTABLE ABDOMEN - 1 VIEW COMPARISON:  08/18/2017. FINDINGS: Normal bowel gas pattern. No nasogastric tube seen. Mild scoliosis and lower lumbar spine fixation hardware. IMPRESSION: No nasogastric tube visualized. Electronically Signed   By: Claudie Revering M.D.   On: 08/23/2017 11:19   Dg Abd Portable 1v  Result Date: 08/18/2017 CLINICAL DATA:  Pneumothorax image timed for 1100hrs.  OG placement. EXAM: PORTABLE ABDOMEN - 1 VIEW COMPARISON:  CT of the abdomen and pelvis on 08/17/2017 FINDINGS: Orogastric tube has been placed, tip overlying the level of the proximal stomach. Patient is rotated towards the LEFT. There is persistent patchy density at the LEFT lung base. IMPRESSION: Orogastric tube tip to level of the stomach. LEFT LOWER lobe opacity. Electronically Signed   By: Nolon Nations M.D.   On: 08/18/2017 11:32   Dg Abd Portable 1v  Result Date: 08/17/2017 CLINICAL DATA:  Pre orogastric tube placement radiograph. EXAM: PORTABLE ABDOMEN - 1 VIEW COMPARISON:  Abdominal and pelvic CT scan of May 15, 2017 FINDINGS: There is a moderate amount of gas and stool within the pelvis. The patient has undergone previous lower lumbar fusion procedure. There numerous tubes and catheters overlying the lower pelvis. There is pneumomediastinum and pneumopericardium and likely hydropneumothorax on the left. IMPRESSION: The bowel gas  pattern is within the limits of normal. Please see the accompanying chest x-ray dictation regarding the left-sided pneumothorax, pneumomediastinum, and pneumopericardium. Electronically Signed   By: David  Martinique M.D.   On: 08/17/2017 14:39   Ct Angio Abd/pel W/ And/or W/o  Result Date: 08/17/2017 CLINICAL DATA:  Gastrointestinal bleeding EXAM: CTA ABDOMEN AND PELVIS wITHOUT AND WITH CONTRAST TECHNIQUE: Multidetector CT imaging of the abdomen and pelvis was performed using the standard protocol during bolus administration of intravenous contrast. Multiplanar reconstructed images and MIPs were obtained and reviewed to evaluate the vascular anatomy. CONTRAST:  111mL ISOVUE-370 IOPAMIDOL (ISOVUE-370) INJECTION 76% COMPARISON:  05/15/2017 FINDINGS: VASCULAR Gastrointestinal bleeding: There is no blush of contrast within the gastrointestinal tract to suggest active gastrointestinal bleeding. Aorta: Nonaneurysmal and patent. Scattered atherosclerotic calcifications. Celiac: Severe narrowing just beyond the origin. Branch vessels patent. SMA: Patent and diminutive. Renals: Bilateral single renal arteries are patent. IMA: Diminutive and patent. Inflow: Common, internal, and external iliac arteries are patent. Proximal Outflow: Grossly patent Veins: No obvious DVT. Review of the MIP images confirms the above findings. NON-VASCULAR Lower chest: There is consolidation at the left lung base. A large left pneumothorax is present. There is a large amount of mediastinal emphysema. There is a tiny right anterior pneumothorax. There is emphysema in the anterior chest soft tissues. Hepatobiliary: Tiny hypodensity in the right lobe of the liver towards the dome. It is stable. Gallbladder is within normal limits. Pancreas: Unremarkable Spleen: Unremarkable Adrenals/Urinary Tract: Benign cysts are present in both kidneys. There is high-density material in the collecting system of both kidneys which conforms to the collecting system  likely representing a test dose of contrast with excretion. Adrenal glands are within normal limits. Foley catheter decompresses the bladder. There is bladder wall thickening. Stomach/Bowel: Stomach and duodenum are decompressed. No evidence of  small-bowel obstruction. Sigmoid diverticulosis is present. There is no evidence of acute diverticulitis. Lymphatic: No abnormal retroperitoneal adenopathy. Reproductive: Uterus is absent.  Adnexa are within normal limits. Other: No free fluid. Musculoskeletal: Postoperative changes from posterior L4-5 fusion. L4 superior anterior endplate compression fracture is stable. IMPRESSION: VASCULAR No evidence of gastrointestinal bleeding. Severe narrowing just beyond the origin of the celiac axis. NON-VASCULAR Consolidation at the left lung base. Large left pneumothorax. Tiny right pneumothorax. Extensive pneumomediastinum. Postoperative changes in the lumbar spine. Stable L4 compression deformity. Findings related to the left pneumothorax were discussed with Dr. Debbora Dus. Electronically Signed   By: Marybelle Killings M.D.   On: 08/17/2017 18:47     CBC Recent Labs  Lab 08/22/17 0425 08/23/17 0415 08/24/17 0500 08/25/17 0414 08/26/17 0340 08/27/17 0324  WBC 10.2 8.8 8.5 9.4 7.6 11.3*  HGB 10.3* 10.1* 9.2* 9.2* 9.5* 9.3*  HCT 31.8* 31.2* 28.5* 28.3* 30.0* 29.4*  PLT 221 248 217 232 287 PLATELET CLUMPS NOTED ON SMEAR, UNABLE TO ESTIMATE  MCV 80.7 81.3 81.2 81.8 82.6 83.1  MCH 26.1 26.3 26.2 26.6 26.2 26.3  MCHC 32.4 32.4 32.3 32.5 31.7 31.6  RDW 18.0* 17.9* 18.1* 18.1* 18.1* 18.4*  LYMPHSABS 1.0 0.8  --   --   --   --   MONOABS 1.0 0.8  --   --   --   --   EOSABS 0.0 0.0  --   --   --   --   BASOSABS 0.0 0.0  --   --   --   --     Chemistries  Recent Labs  Lab 08/21/17 0436  08/22/17 0425 08/23/17 0415 08/24/17 0500 08/25/17 0414 08/26/17 0340  NA 143   < > 140 139 142 138 139  K 3.3*   < > 3.6 3.3* 2.8* 3.0* 3.8  CL 107   < > 99 97* 101 100 103    CO2 26   < > 28 29 28 28 27   GLUCOSE 79   < > 70 90 78 206* 111*  BUN 9   < > 10 10 8  5* 10  CREATININE 0.34*   < > 0.48 0.50 0.34* 0.47 0.43*  CALCIUM 8.5*   < > 9.0 8.8* 8.4* 8.2* 8.4*  MG 1.7  --   --   --   --  1.7 2.3  AST  --   --   --   --   --  28 29  ALT  --   --   --   --   --  25 26  ALKPHOS  --   --   --   --   --  42 53  BILITOT  --   --   --   --   --  0.8 0.6   < > = values in this interval not displayed.   ------------------------------------------------------------------------------------------------------------------ No results for input(s): CHOL, HDL, LDLCALC, TRIG, CHOLHDL, LDLDIRECT in the last 72 hours.  Lab Results  Component Value Date   HGBA1C 4.7 (L) 06/10/2017   ------------------------------------------------------------------------------------------------------------------ No results for input(s): TSH, T4TOTAL, T3FREE, THYROIDAB in the last 72 hours.  Invalid input(s): FREET3 ------------------------------------------------------------------------------------------------------------------ Recent Labs    08/25/17 0414  VITAMINB12 1,377*  FOLATE 25.4    Coagulation profile No results for input(s): INR, PROTIME in the last 168 hours.  No results for input(s): DDIMER in the last 72 hours.  Cardiac Enzymes No results for input(s): CKMB, TROPONINI, MYOGLOBIN in the last 168  hours.  Invalid input(s): CK ------------------------------------------------------------------------------------------------------------------ No results found for: BNP   Ellinore Merced M.D on 08/27/2017 at 1:31 PM   Go to [email or web address] - password [email or web address] for contact info  [facility] - Office  [phone]

## 2017-08-27 NOTE — Progress Notes (Signed)
Received consult for PIV. Pt has fluids at [facility] to port. Blood tubing changed, instructed pt's RN, Mickel Baas to transfuse via port.

## 2017-08-27 NOTE — Progress Notes (Signed)
PT Cancellation Note  Patient Details Name: [name] [name] MRN: [id number] DOB: [birth year]   Cancelled Treatment:    Reason Eval/Treat Not Completed: Medical issues which prohibited therapy. Patient receiving blood transfusion and working with SLP.   Ellamae Sia, PT, DPT Acute Rehabilitation Services  Pager: [place] 08/27/2017, 1:01 PM

## 2017-08-27 NOTE — Progress Notes (Signed)
  Speech Language Pathology Treatment: Dysphagia  Patient Details Name: [name] [name] MRN: [id number] DOB: 31-Oct-1940 Today's Date: 08/27/2017 Time: 5789-7847 SLP Time Calculation (min) (ACUTE ONLY): 10 min  Assessment / Plan / Recommendation Clinical Impression  Signs of an oral dysphagia persist, including oral holding, prolonged bolus formation, and reduced awareness. She does initiate a pharyngeal swallow more consistently with purees and thin liquids than she does with ice chips. Delayed coughing is observed. Per family wishes as documented in previous SLP's note, will proceed with MBS to assess current oropharyngeal function. This may assist with decision making as well. Will schedule as early as able.   HPI HPI: Pt is a 32yoF with hx Polymyositis, CVA, Gastric leiomyoma, HTN, GERD, and dCHF, who presented to ER with BRBPR, hypotension, and AMS. Difficult intubation 8/5, extubated 8/8. EGD showed no signs of bleeding. CXR post-intubation showed L PTX. CT Head negative. BSE 06/13/17 recommended Dys 3 diet and thin liquids, advanced to regular textures prior to d/c. MBS in May 2019 showed an overall functional swallow with a single incident of flash penetration. Pt was noted to cough during MBS but not in the setting of airway compromise. There was also concern for a prominent CP and a possible Zencker's diverticulum developing, but no backflow was observed. Dys 3 diet and thin liquids were recommended at that time.      SLP Plan  MBS       Recommendations  Diet recommendations: NPO Medication Administration: Via alternative means                Oral Care Recommendations: Oral care QID Follow up Recommendations: Skilled Nursing facility SLP Visit Diagnosis: Dysphagia, unspecified (R13.10) Plan: MBS       GO                Germain Osgood 08/27/2017, 12:05 PM  Germain Osgood, M.A. CCC-SLP [phone]

## 2017-08-28 ENCOUNTER — Ambulatory Visit: Payer: Medicare Other | Admitting: Adult Health

## 2017-08-28 ENCOUNTER — Other Ambulatory Visit: Payer: Self-pay

## 2017-08-28 LAB — CBC
HCT: 34.1 % — ABNORMAL LOW (ref 36.0–46.0)
Hemoglobin: 10.7 g/dL — ABNORMAL LOW (ref 12.0–15.0)
MCH: 26.6 pg (ref 26.0–34.0)
MCHC: 31.4 g/dL (ref 30.0–36.0)
MCV: 84.6 fL (ref 78.0–100.0)
Platelets: 313 10*3/uL (ref 150–400)
RBC: 4.03 MIL/uL (ref 3.87–5.11)
RDW: 17.8 % — AB (ref 11.5–15.5)
WBC: 13.2 10*3/uL — ABNORMAL HIGH (ref 4.0–10.5)

## 2017-08-28 LAB — GLUCOSE, CAPILLARY
Glucose-Capillary: 114 mg/dL — ABNORMAL HIGH (ref 70–99)
Glucose-Capillary: 115 mg/dL — ABNORMAL HIGH (ref 70–99)
Glucose-Capillary: 132 mg/dL — ABNORMAL HIGH (ref 70–99)
Glucose-Capillary: 89 mg/dL (ref 70–99)
Glucose-Capillary: 99 mg/dL (ref 70–99)

## 2017-08-28 LAB — BASIC METABOLIC PANEL
Anion gap: 8 (ref 5–15)
BUN: 13 mg/dL (ref 8–23)
CALCIUM: 8.7 mg/dL — AB (ref 8.9–10.3)
CO2: 27 mmol/L (ref 22–32)
Chloride: 105 mmol/L (ref 98–111)
Creatinine, Ser: 0.43 mg/dL — ABNORMAL LOW (ref 0.44–1.00)
GFR calc Af Amer: >60 mL/min (ref >60)
GLUCOSE: 104 mg/dL — AB (ref 70–99)
Potassium: 4.7 mmol/L (ref 3.5–5.1)
Sodium: 140 mmol/L (ref 135–145)

## 2017-08-28 LAB — TYPE AND SCREEN
ABO/RH(D): O POS
ANTIBODY SCREEN: NEGATIVE
UNIT DIVISION: 0

## 2017-08-28 LAB — BPAM RBC
Blood Product Expiration Date: 201908202359
ISSUE DATE / TIME: 201908150832
UNIT TYPE AND RH: 9500

## 2017-08-28 LAB — MAGNESIUM: Magnesium: 2.1 mg/dL (ref 1.7–2.4)

## 2017-08-28 MED ORDER — DILTIAZEM LOAD VIA INFUSION
10.0000 mg | Freq: Once | INTRAVENOUS | Status: DC
  Filled 2017-08-28: qty 10

## 2017-08-28 MED ORDER — DILTIAZEM HCL 25 MG/5ML IV SOLN
10.0000 mg | Freq: Once | INTRAVENOUS | Status: AC
  Administered 2017-08-28: 10 mg via INTRAVENOUS
  Filled 2017-08-28: qty 5

## 2017-08-28 NOTE — Clinical Social Work Note (Addendum)
[place] contacted and talked with admissions staff person regarding patient and having a Cortrak/Dobbhoff tube place. CSW advised that patient has been to [facility], then home and returned to hospital, then came to them at discharge. They have patient in a semi-private room as they know patient has a pending Medicaid application. Admissions staff person advised that patient currently has a Cortrak/Dobbhoff tube and family is considering a PEG tube. CSW advised that they can take a PEG tube but not NG tubes.   CSW will continue to follow patient's progress and will keep [employer] updated regarding feeding tube status. CSW will continue to monitor patient's progress, provide SW intervention services as needed and facilitate discharge to SNF once medically stable.  Kasey Ewings Givens, MSW, LCSW Licensed Clinical Social Worker [place] [phone]

## 2017-08-28 NOTE — Progress Notes (Signed)
  Speech Language Pathology Treatment: Dysphagia  Patient Details Name: [name] [name] MRN: [id number] DOB: Jun 28, 1940 Today's Date: 08/28/2017 Time: 3785-8850 SLP Time Calculation (min) (ACUTE ONLY): 29 min  Assessment / Plan / Recommendation Clinical Impression  Treatment focused heavily on family education with son present. Results of MBS were reviewed, including aspiration and significant oral dysphagia. We also discussed her current cognitive-linguistic status and how this can impact her swallowing function. He still says that he wants more time to consider longer-term alternative means of nutrition. Of note, he is under the impression that the Cortrak could be left in place for several months as the pt stays here and works to rehabilitate her swallowing.   Pt today has minimal oral manipulation of ice chips, moving them around with her tongue and intermittently biting down on them. Her oral phase is moderately prolonged with suspected delay in swallow trigger, followed by consistent coughing. Mod cues were provided for swallow initiation and more forceful coughing. Oral suction was used. SLP discussed the severity of pt's dysphagia with son. Given minimal progress seen thus far, suspect that any recovery would be quite prolonged, and would also likely depend on improvements in pt's mentation. I also shared that it is possible that despite rehab efforts, pt may not be able to support herself orally. Additional discussion with her doctors and palliative care team recommended. SLP will also continue to follow.   HPI HPI: Pt is a 1yoF with hx Polymyositis, CVA, Gastric leiomyoma, HTN, GERD, and dCHF, who presented to ER with BRBPR, hypotension, and AMS. Difficult intubation 8/5, extubated 8/8. EGD showed no signs of bleeding. CXR post-intubation showed L PTX. CT Head negative. BSE 06/13/17 recommended Dys 3 diet and thin liquids, advanced to regular textures prior to d/c. MBS in May 2019 showed an  overall functional swallow with a single incident of flash penetration. Pt was noted to cough during MBS but not in the setting of airway compromise. There was also concern for a prominent CP and a possible Zencker's diverticulum developing, but no backflow was observed. Dys 3 diet and thin liquids were recommended at that time.      SLP Plan  Continue with current plan of care       Recommendations  Diet recommendations: NPO Medication Administration: Via alternative means                Oral Care Recommendations: Oral care QID Follow up Recommendations: Skilled Nursing facility;24 hour supervision/assistance SLP Visit Diagnosis: Dysphagia, oropharyngeal phase (R13.12) Plan: Continue with current plan of care       GO                Germain Osgood 08/28/2017, 4:04 PM  Germain Osgood, M.A. CCC-SLP [phone]

## 2017-08-28 NOTE — Progress Notes (Addendum)
Patient Demographics:    [name] [name], is a 77 y.o. female, DOB - Jan 11, 1941, HLK:[id number]  Admit date - 08/17/2017   Admitting Physician [name], MD  Outpatient Primary MD for the patient is [name], MD  LOS - 11   Chief Complaint  Patient presents with  . Rectal Bleeding        Subjective:   Per nursing staff patient did not have any episodes of bloody stools.  Tolerating the feeds well.  Patient follows simple commands well.  Patient failed modified barium swallow   Active Problems:   Polymyositis (HCC)   Adrenal insufficiency (HCC)   Acute blood loss anemia   CVA (cerebral vascular accident) ([place])   Acute upper GI bleed   Nontraumatic hemorrhagic shock ([place])   Brief Narrative:  77yo female SNF resident with a hx of polymyositis, CVA with complete right hemiparesis, contracted, gastric leiomyoma, hypertension, GERD, and diastolic CHF who was brought to the ED w/ bright red blood per rectum. She had episodes of hypotension and unresponsiveness in the ED and required intubation. She was transfused 2 unit of PRBC transfusion. Chest x-ray post intubation showed left pneumothorax. She was extubated on 8/8.  Gastroenterology was consulted, underwent EGD.  CT abdomen and pelvis without any acute abnormality.  Aspirin Plavix have been on hold.  Considering patient being contracted, no further endoscopic work-up is planned.  Significant Events: 8/5 admit - intubated in ED - L sided chest tube placed  8/8 extubated      Assessment & Plan:  ##Hematochezia - Hemorrhagic shock--- resolved hemorrhagic shock, one episode of BM with blood on 08/26/2017,  -H&H has been stable, the globin on 08/26/2017 is 9.5, continue to monitor H&H and transfuse as clinically indicated - EGD on 08/17/17 by Dr Lyndel Safe negative  - s/p 3U PRBC  -CT abdom/pelvis w/o acute findings  -cont to holdAspirin, Plavix  -diverticular v/s recurrent colonic AVM - no further endoscopic work-up planned -Hemoglobin stable     ## Acute hypoxic respiratory failure  - Pulmonary edema / B pleural effusions - Aspiration pneumonia --- improved from a respiratory standpoint, Intubated 08/17/17 and Extubated on 08/20/17  - resolved w/ pt now on RA - off abx    ##Dysphagia - Has failed numerous swallow studies  - family agreed to have Cortrak placed 08/24/17 after 3 failed attempts at NG placement 08/23/17  - tolerating Cortrak and tube feeding well thus far - d/w speech pathologist Ms. Buechet on 08/26/2017, at this time patient's son Evangeline Gula states that they are not ready to make a decision on comfort measures versus PEG tube placement yet they want to continue to feed through Dobbhoff tube /Cortrax for now. -Palliative care has been consulted, plan to continue the speech therapy, if no improvement, if family does not agree for PEG tube consider hospice care -Modified barium swallow-failed.  Recommended n.p.o.  ##Acute encephalopathy metabolic - multi-factorial, ikely due to hypoperfusion, aspiration pneumonia, + Hx of CVA - head CT noted mild diffuse cerebral and cerebellar atrophy,marked chronic white matter ischemic change  - at baseline patient was able to hold a conversation, withno memory deficits or dementia per son  - B12 and folate not low - avoid sedatives -Currently following simple commands  ##Hypertension --  Discontinue hydralazine 25 mg 3 times daily, Imdur -Increase metoprolol 50 mg twice daily - may use IV Hydralazine 10 mg  Every 4 hours Prn for systolic blood pressure over 160 mmhg -Start the patient on amlodipine as blood pressure is poorly controlled  ##Left-sided pneumothorax - chest tube removed - resolved - felt to be related to intubation/positive pressure ventilation   ##Polymyositis - Chronic adrenal insufficiency - OnCellCept and prednisone - follows with Rheumatologist (Dr. 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 SNF  ##FEN--continue Osmolite 1.2 tube feed at 25 mL an hour, titrate  DVT prophylaxis: SCDs (Gi Bleed) Code Status: DNR - NO CODE Family Communication:   Attempted to call son [place].  No response Disposition Plan: TBD  Consultants:  PCCM GI Palliative Speech  Antimicrobials:  Unasyn 8/7 > 8/13   Lab Results  Component Value Date   PLT 313 08/28/2017    Inpatient Medications  Scheduled Meds: . sodium chloride   Intravenous Once  . chlorhexidine  15 mL Mouth Rinse BID  . Chlorhexidine Gluconate Cloth  6 each Topical Daily  . feeding supplement (PRO-STAT SUGAR FREE 64)  30 mL Per Tube Daily  . hydrALAZINE  25 mg Per Tube TID  . isosorbide dinitrate  5 mg Per Tube TID  . mouth rinse  15 mL Mouth Rinse q12n4p  . metoprolol tartrate  25 mg Per Tube TID PC  . mycophenolate  1,000 mg Per Tube BID  . pantoprazole sodium  40 mg Per Tube Daily  . potassium chloride  20 mEq Per Tube TID  . predniSONE  20 mg Per Tube BID  . sodium chloride flush  3 mL Intravenous Q12H   Continuous Infusions: . sodium chloride 10 mL/hr at 08/25/17 2000  . feeding supplement (OSMOLITE 1.2 CAL) 1,000 mL (08/27/17 2332)   PRN Meds:.acetaminophen (TYLENOL) oral liquid 160 mg/5 mL, hydrALAZINE, morphine injection, [DISCONTINUED] ondansetron **OR** ondansetron (ZOFRAN) IV, sodium chloride flush  Anti-infectives (From admission, onward)   Start     Dose/Rate Route Frequency Ordered Stop   08/19/17 1400  Ampicillin-Sulbactam (UNASYN) 3 g in sodium chloride 0.9 % 100  mL IVPB  Status:  Discontinued     3 g 200 mL/hr over 30 Minutes Intravenous Every 8 hours 08/19/17 1103 08/25/17 1220   08/18/17 1000  piperacillin-tazobactam (ZOSYN) IVPB 3.375 g  Status:  Discontinued     3.375 g 12.5 mL/hr over 240 Minutes Intravenous Every 8 hours 08/18/17 0512 08/19/17 1103   08/18/17 0515  piperacillin-tazobactam (ZOSYN) IVPB 3.375 g     3.375 g 100 mL/hr over 30 Minutes Intravenous  Once 08/18/17 0512 08/18/17 0631        Objective:   Vitals:   08/27/17 2012 08/28/17 0425 08/28/17 0844 08/28/17 1240  BP: (!) 169/90 (!) 169/76 (!) 177/87 (!) 160/81  Pulse: 91 94 92 96  Resp: 15 15    Temp: 98.7 F (37.1 C) 98.1 F (36.7 C)    TempSrc:      SpO2: 100% 97%    Weight: 65.1 kg     Height:  Wt Readings from Last 3 Encounters:  08/27/17 65.1 kg  07/24/17 65.3 kg  07/21/17 65.5 kg     Intake/Output Summary (Last 24 hours) at 08/28/2017 1327 Last data filed at 08/28/2017 1302 Gross per 24 hour  Intake 1264.41 ml  Output 1925 ml  Net -660.59 ml     Physical Exam  Gen:- Awake Alert,  In no apparent distress , nonverbal, HEENT:- [place].AT, No sclera icterus Nose-contrax tube with tube feeding running Neck-Supple Neck,No JVD,.  Lungs-diminished in bases, no wheezing CV- S1, S2 normal, right-sided Port-A-Cath Abd-  +ve B.Sounds, Abd Soft, No tenderness,    Extremity/Skin:- No  edema,   warm and dry Psych-Limited psych exam due to patient being nonverbal and unable to follow commands or instructions,  neuro-no new focal deficits, no tremors (at baseline apparently patient is mostly bedbound) GU- Foley   Data Review:   Micro Results No results found for this or any previous visit (from the past 240 hour(s)).  Radiology Reports Ct Angio Head W Or Wo Contrast  Result Date: 08/17/2017 CLINICAL DATA:  Focal neuro deficit. Altered mental status. Periods of unresponsiveness EXAM: CT ANGIOGRAPHY HEAD AND NECK TECHNIQUE: Multidetector CT imaging of the  head and neck was performed using the standard protocol during bolus administration of intravenous contrast. Multiplanar CT image reconstructions and MIPs were obtained to evaluate the vascular anatomy. Carotid stenosis measurements (when applicable) are obtained utilizing NASCET criteria, using the distal internal carotid diameter as the denominator. CONTRAST:  158mL ISOVUE-370 IOPAMIDOL (ISOVUE-370) INJECTION 76% COMPARISON:  Noncontrast head CT earlier today. CTA head neck 06/09/2017 FINDINGS: CTA NECK FINDINGS Aortic arch: Normal.  Three vessel branching. Right carotid system: Limited by motion at the distal ICA. Visible vessels are smooth and widely patent. Left carotid system: Limited by motion at the distal left ICA. Visible vessels are smooth and widely patent. Vertebral arteries: No proximal subclavian flow limiting stenosis. Both vertebral arteries are patent in the neck. Skeleton: No acute finding Other neck: No acute finding. Upper chest: Porta catheter on the right.  No acute finding. Review of the MIP images confirms the above findings CTA HEAD FINDINGS Anterior circulation: No evidence of emergent large vessel occlusion or proximal flow limiting stenosis atherosclerotic narrowing and irregularity of medium size vessels (M2 level and beyond) that is extensive, see markings on MIP images. Negative for aneurysm. Posterior circulation: The vertebral and basilar arteries are smooth and widely patent. Symmetric flow in bilateral branches. Atherosclerotic irregularity of left more than right PCA branches. Negative for aneurysm Venous sinuses: Patent Anatomic variants: None significant Delayed phase: Not obtained Review of the MIP images confirms the above findings IMPRESSION: 1. No emergent large vessel occlusion or other acute finding. Stable from 06/09/2017. 2. Extensive atherosclerotic irregularity and narrowing of intracranial medium size vessels. 3. No significant atherosclerosis or stenosis in the  neck. Electronically Signed   By: Monte Fantasia M.D.   On: 08/17/2017 11:31   Dg Chest 1 View  Result Date: 08/22/2017 CLINICAL DATA:  Hypoxia, altered mental status EXAM: CHEST  1 VIEW COMPARISON:  the previous day's study FINDINGS: The left pigtail drain catheter is been removed. No pneumothorax evident. Blunting of the left lateral costophrenic angle suggesting small effusion. Stable right IJ port catheter. Atelectasis/infiltrate in the lung bases left greater than right, improved since prior study. Heart size upper limits normal for technique. Aortic Atherosclerosis (ICD10-170.0). Regional bones unremarkable. IMPRESSION: 1. Interval removal of left chest tube with no pneumothorax. 2. Possible small left pleural effusion.  3. Bibasilar atelectasis/consolidation, improved since previous exam Electronically Signed   By: Lucrezia Europe M.D.   On: 08/22/2017 10:04   Ct Head Wo Contrast  Result Date: 08/19/2017 CLINICAL DATA:  Altered mental status.  Cephalopathy. EXAM: CT HEAD WITHOUT CONTRAST TECHNIQUE: Contiguous axial images were obtained from the base of the skull through the vertex without intravenous contrast. COMPARISON:  08/17/2017. FINDINGS: Brain: Diffusely enlarged ventricles and subarachnoid spaces. Patchy white matter low density in both cerebral hemispheres. Streak artifacts from a high density lead attached to the right frontal bone. No intracranial hemorrhage, mass lesion or CT evidence of acute infarction. Vascular: No hyperdense vessel or unexpected calcification. Skull: Normal. Negative for fracture or focal lesion. Sinuses/Orbits: Unremarkable. Other: None. IMPRESSION: 1. No acute abnormality. 2. Stable mild diffuse cerebral and cerebellar atrophy. 3. Stable marked chronic small vessel white matter ischemic changes in both cerebral hemispheres. Electronically Signed   By: Claudie Revering M.D.   On: 08/19/2017 13:50   Ct Head Wo Contrast  Result Date: 08/17/2017 CLINICAL DATA:  Bilateral arm  weakness EXAM: CT HEAD WITHOUT CONTRAST TECHNIQUE: Contiguous axial images were obtained from the base of the skull through the vertex without intravenous contrast. COMPARISON:  06/09/2017 brain MRI FINDINGS: Brain: Low-density in the posterior left frontal subcortical white matter is in close approximation to infarcts seen on 06/09/2017 brain MRI. There is a background of advanced chronic small vessel ischemia with confluent low-density in the cerebral white matter. Indistinct deep gray nuclei from chronic ischemic injury, including discrete remote lacunar infarct in the right thalamus. No evident hemorrhage, mass, or hydrocephalus. Subcentimeter left parietal inner table calcification considered incidental. Vascular: No hyperdense vessel.  Atherosclerotic calcification. Skull: No evidence of fracture or bone lesion. Sinuses/Orbits: No pathologic finding. Bilateral cataract resection. Other: Motion degraded scan, best obtainable due to patient condition. IMPRESSION: 1. Motion degraded study without definite acute finding. 2. A small left cerebral white matter infarct was likely present on 06/09/2017 brain MRI. 3. Severe chronic small vessel ischemia. Electronically Signed   By: Monte Fantasia M.D.   On: 08/17/2017 09:19   Ct Angio Neck W Or Wo Contrast  Result Date: 08/17/2017 CLINICAL DATA:  Focal neuro deficit. Altered mental status. Periods of unresponsiveness EXAM: CT ANGIOGRAPHY HEAD AND NECK TECHNIQUE: Multidetector CT imaging of the head and neck was performed using the standard protocol during bolus administration of intravenous contrast. Multiplanar CT image reconstructions and MIPs were obtained to evaluate the vascular anatomy. Carotid stenosis measurements (when applicable) are obtained utilizing NASCET criteria, using the distal internal carotid diameter as the denominator. CONTRAST:  180mL ISOVUE-370 IOPAMIDOL (ISOVUE-370) INJECTION 76% COMPARISON:  Noncontrast head CT earlier today. CTA head neck  06/09/2017 FINDINGS: CTA NECK FINDINGS Aortic arch: Normal.  Three vessel branching. Right carotid system: Limited by motion at the distal ICA. Visible vessels are smooth and widely patent. Left carotid system: Limited by motion at the distal left ICA. Visible vessels are smooth and widely patent. Vertebral arteries: No proximal subclavian flow limiting stenosis. Both vertebral arteries are patent in the neck. Skeleton: No acute finding Other neck: No acute finding. Upper chest: Porta catheter on the right.  No acute finding. Review of the MIP images confirms the above findings CTA HEAD FINDINGS Anterior circulation: No evidence of emergent large vessel occlusion or proximal flow limiting stenosis atherosclerotic narrowing and irregularity of medium size vessels (M2 level and beyond) that is extensive, see markings on MIP images. Negative for aneurysm. Posterior circulation: The vertebral and basilar arteries  are smooth and widely patent. Symmetric flow in bilateral branches. Atherosclerotic irregularity of left more than right PCA branches. Negative for aneurysm Venous sinuses: Patent Anatomic variants: None significant Delayed phase: Not obtained Review of the MIP images confirms the above findings IMPRESSION: 1. No emergent large vessel occlusion or other acute finding. Stable from 06/09/2017. 2. Extensive atherosclerotic irregularity and narrowing of intracranial medium size vessels. 3. No significant atherosclerosis or stenosis in the neck. Electronically Signed   By: Monte Fantasia M.D.   On: 08/17/2017 11:31   Dg Chest Port 1 View  Result Date: 08/27/2017 CLINICAL DATA:  Dyspnea. EXAM: PORTABLE CHEST 1 VIEW COMPARISON:  Radiograph of August 22, 2017. FINDINGS: Stable cardiomegaly. Right internal jugular Port-A-Cath is unchanged in position. No pneumothorax is noted. Elevated right hemidiaphragm is noted with mild right basilar subsegmental atelectasis. Bony thorax is unremarkable. Feeding tube is seen  entering the stomach. Bony thorax is unremarkable. IMPRESSION: Stable cardiomegaly. Elevated right hemidiaphragm is noted with mild right basilar subsegmental atelectasis. Interval placement of feeding tube seen entering stomach. Electronically Signed   By: Marijo Conception, M.D.   On: 08/27/2017 09:20   Dg Chest Port 1 View  Result Date: 08/21/2017 CLINICAL DATA:  History of pneumothorax.  Chest tube in place. EXAM: PORTABLE CHEST 1 VIEW COMPARISON:  August 20, 2016 FINDINGS: No pneumothorax. The left chest tube is stable. A layering effusion with underlying opacity in seen on the right. Probable edema. No nodules or masses. Stable cardiomediastinal silhouette. Stable right Port-A-Cath. IMPRESSION: 1. Left-sided chest tube.  No pneumothorax. 2. Layering effusions bilaterally, right greater than left underlying atelectasis. 3. Pulmonary edema. Electronically Signed   By: Dorise Bullion III M.D   On: 08/21/2017 09:49   Dg Chest Port 1 View  Result Date: 08/20/2017 CLINICAL DATA:  Assess for pneumothorax. EXAM: PORTABLE CHEST 1 VIEW COMPARISON:  Chest radiograph performed earlier today at 4:32 a.m. FINDINGS: A right-sided chest port is noted ending about the mid SVC. Small bilateral pleural effusions are noted. Increased interstitial markings raise concern for pulmonary edema, worsened from the prior study. No pneumothorax is seen. A left chest pigtail catheter is noted in essentially unchanged position. The cardiomediastinal silhouette is borderline normal in size. No acute osseous abnormalities are seen. IMPRESSION: 1. No pneumothorax seen. Left chest pigtail catheter noted in essentially unchanged position. 2. Small bilateral pleural effusions noted. Increased interstitial markings raise concern for pulmonary edema, worsened from the prior study. Electronically Signed   By: Garald Balding M.D.   On: 08/20/2017 22:50   Dg Chest Port 1 View  Result Date: 08/20/2017 CLINICAL DATA:  Respiratory failure. EXAM:  PORTABLE CHEST 1 VIEW COMPARISON:  08/19/2017 FINDINGS: Endotracheal tube remains present with the tip approximately 3 cm above the carina. Stable positioning pigtail chest tube on the left and right-sided Port-A-Cath. Gastric decompression tube extends below the diaphragm. Lungs show slight increase in right basilar atelectasis. The left lower lobe shows improved aeration. No overt edema, evidence of pneumothorax or pleural fluid. IMPRESSION: Improved aeration of the left lower lung. Slight increase in right basilar atelectasis. Electronically Signed   By: Aletta Edouard M.D.   On: 08/20/2017 11:08   Dg Chest Port 1 View  Result Date: 08/19/2017 CLINICAL DATA:  Endotracheal tube is assessment EXAM: PORTABLE CHEST 1 VIEW COMPARISON:  August 18, 2017 FINDINGS: The heart size and mediastinal contours are stable. Endotracheal tube is identified with distal tip 4.5 cm from carina in good position. A nasogastric tube is identified  with distal tip not included on film but is at least in the stomach. A right central venous line is identified with distal tip in the superior vena cava, unchanged. Left chest tube is unchanged. There is pulmonary vascular congestion. Probable left pleural effusion is identified. Consolidation of left lung base is unchanged. The visualized skeletal structures are stable. IMPRESSION: Endotracheal tube is identified with distal tip 4.5 cm from carina, in good position. Pulmonary vascular congestion of the lungs. There is probable left pleural effusion with consolidation of left lung base unchanged compared prior exam. Electronically Signed   By: Abelardo Diesel M.D.   On: 08/19/2017 09:32   Dg Chest Port 1 View  Result Date: 08/18/2017 CLINICAL DATA:  Pneumothorax image timed for 1100hrs.  OG placement. EXAM: PORTABLE CHEST 1 VIEW COMPARISON:  08/18/2017 FINDINGS: Endotracheal tube is in place with tip approximately 3.5 centimeters above the carina. An orogastric tube is in place, tip off the  image beyond the gastroesophageal junction. The patient has a RIGHT-sided Port-A-Cath with tip to level of superior vena cava. The patient has a LEFT-sided small bore chest tube. There is no pneumothorax. The heart is enlarged. Patient is rotated towards the LEFT. There is dense opacity at the LEFT lung base which obscures the LEFT hemidiaphragm. Mild subsegmental atelectasis in the RIGHT LOWER lobe, stable in appearance. IMPRESSION: 1. No pneumothorax. 2. Persistent dense LEFT LOWER lobe opacity. 3. Orogastric tube tip beyond the gastroesophageal junction. Electronically Signed   By: Nolon Nations M.D.   On: 08/18/2017 11:31   Dg Chest Port 1 View  Result Date: 08/18/2017 CLINICAL DATA:  Pneumothorax.  Shortness of breath. EXAM: PORTABLE CHEST 1 VIEW COMPARISON:  One-view chest x-ray 08/17/2017. FINDINGS: Endotracheal tube is stable. Right IJ catheter is stable. A left-sided chest tube is in place. No visible pneumothorax is present. Basilar atelectasis is present. Mild pulmonary vascular congestion is noted. IMPRESSION: 1. No significant pneumothorax with left-sided chest tube in place. 2. Left basilar airspace opacity likely representing small effusion and atelectasis. 3. Support apparatus is stable. Electronically Signed   By: San Morelle M.D.   On: 08/18/2017 07:07   Dg Chest Port 1 View  Result Date: 08/17/2017 CLINICAL DATA:  77 year old female with history of chest tube in the left side. EXAM: PORTABLE CHEST 1 VIEW COMPARISON:  Chest x-ray 08/17/2017. FINDINGS: Compared to the prior examination there has been interval placement of a small bore left chest tube with tip in the apex of the left hemithorax. Previously noted left-sided pneumothorax is significantly decreased in size compared to the prior examination, now likely less than 5-10% of the volume of the left hemithorax. Right internal jugular single-lumen porta cath with tip terminating near the superior cavoatrial junction. Patient  is intubated, with the tip of the endotracheal tube approximately 3 cm above the carina. Lung volumes are low. Persistent elevation of the right hemidiaphragm. Bibasilar linear opacities and linear opacities in the left mid lung, likely to reflect areas of subsegmental atelectasis and/or scarring. No definite consolidative airspace disease. No pleural effusions. No evidence of pulmonary edema. Heart size appears borderline enlarged. The patient is rotated to the left on today's exam, resulting in distortion of the mediastinal contours and reduced diagnostic sensitivity and specificity for mediastinal pathology. Aortic atherosclerosis. IMPRESSION: 1. Support apparatus, as above. 2. Regression of previously noted left-sided pneumothorax following chest tube placement, now extremely small. 3. Low lung volumes with bibasilar areas of subsegmental atelectasis and/or scarring. Electronically Signed   By: Quillian Quince  Entrikin M.D.   On: 08/17/2017 20:41   Dg Chest Port 1 View  Result Date: 08/17/2017 CLINICAL DATA:  Difficult intubation.  Orogastric tube placement. EXAM: PORTABLE CHEST 1 VIEW COMPARISON:  Portable chest x-ray of Jun 09, 2017 FINDINGS: The endotracheal tube tip projects 1-2 cm above the carina. There is considerable subcutaneous emphysema however. There is a 10% or less left pneumothorax. There is subsegmental atelectasis in the right lower lung and in the left mid lung. There is subcutaneous emphysema in the left axillary region and bilaterally at the base of the neck. The heart is normal in size. The pulmonary vascularity is not engorged. The porta catheter tip projects over the midportion of the SVC. No esophagogastric tube is observed. IMPRESSION: There is a 10% or less left sided pneumothorax. There is a large amount of subcutaneous emphysema and pneumomediastinum and a small amount of pneumopericardium. The endotracheal tube tip projects approximately 1-2 cm above the carina. Subsegmental atelectasis  in both lungs. No CHF.  There is calcification in the wall of the aortic arch. These results will be called to the ordering clinician or representative by the Radiologist Assistant, and communication documented in the PACS or zVision Dashboard. Electronically Signed   By: David  Martinique M.D.   On: 08/17/2017 14:37   Dg Abd Portable 1v  Result Date: 08/23/2017 CLINICAL DATA:  Nasogastric tube placement. EXAM: PORTABLE ABDOMEN - 1 VIEW COMPARISON:  08/18/2017. FINDINGS: Normal bowel gas pattern. No nasogastric tube seen. Mild scoliosis and lower lumbar spine fixation hardware. IMPRESSION: No nasogastric tube visualized. Electronically Signed   By: Claudie Revering M.D.   On: 08/23/2017 11:19   Dg Abd Portable 1v  Result Date: 08/18/2017 CLINICAL DATA:  Pneumothorax image timed for 1100hrs.  OG placement. EXAM: PORTABLE ABDOMEN - 1 VIEW COMPARISON:  CT of the abdomen and pelvis on 08/17/2017 FINDINGS: Orogastric tube has been placed, tip overlying the level of the proximal stomach. Patient is rotated towards the LEFT. There is persistent patchy density at the LEFT lung base. IMPRESSION: Orogastric tube tip to level of the stomach. LEFT LOWER lobe opacity. Electronically Signed   By: Nolon Nations M.D.   On: 08/18/2017 11:32   Dg Abd Portable 1v  Result Date: 08/17/2017 CLINICAL DATA:  Pre orogastric tube placement radiograph. EXAM: PORTABLE ABDOMEN - 1 VIEW COMPARISON:  Abdominal and pelvic CT scan of May 15, 2017 FINDINGS: There is a moderate amount of gas and stool within the pelvis. The patient has undergone previous lower lumbar fusion procedure. There numerous tubes and catheters overlying the lower pelvis. There is pneumomediastinum and pneumopericardium and likely hydropneumothorax on the left. IMPRESSION: The bowel gas pattern is within the limits of normal. Please see the accompanying chest x-ray dictation regarding the left-sided pneumothorax, pneumomediastinum, and pneumopericardium. Electronically  Signed   By: David  Martinique M.D.   On: 08/17/2017 14:39   Dg Swallowing Func-speech Pathology  Result Date: 08/27/2017 Objective Swallowing Evaluation: Type of Study: MBS-Modified Barium Swallow Study  Patient Details Name: HAWLEY PAVIA MRN: [id number] Date of Birth: [birth year] Today's Date: 08/27/2017 Time: SLP Start Time (ACUTE ONLY): 1500 -SLP Stop Time (ACUTE ONLY): 1520 SLP Time Calculation (min) (ACUTE ONLY): 20 min Past Medical History: Past Medical History: Diagnosis Date . Allergic rhinitis, cause unspecified  . Benign neoplasm of colon  . Chronic diastolic CHF (congestive heart failure) ([place])  . CVA (cerebral vascular accident) ([facility]) 05/2017 . Esophageal reflux  . Excessive daytime sleepiness  . 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 GI hemorrhage 2011  recurrent . Hyperlipidemia  . Osteoporosis  . Polymyositis ([place])  . Unspecified essential hypertension  . Vitamin D deficiency  Past Surgical History: Past Surgical History: Procedure Laterality Date . ABDOMINAL HYSTERECTOMY    partial . BACK SURGERY   . COLONOSCOPY  01/09/2011  others also . COLONOSCOPY  05/29/2011  Procedure: COLONOSCOPY;  Surgeon: Gatha Mayer, MD;  Location: [facility];  Service: Endoscopy;  Laterality: N/A;  Greggory Brandy Carlean Purl . COLONOSCOPY WITH PROPOFOL N/A 02/16/2014  Procedure: COLONOSCOPY WITH PROPOFOL;  Surgeon: Milus Banister, MD;  Location: [facility];  Service: Endoscopy;  Laterality: N/A; . ESOPHAGOGASTRODUODENOSCOPY  12/08/2010  others also . ESOPHAGOGASTRODUODENOSCOPY N/A 08/17/2017  Procedure: ESOPHAGOGASTRODUODENOSCOPY (EGD);  Surgeon: Jackquline Denmark, MD;  Location: [facility] ENDOSCOPY;  Service: Endoscopy;  Laterality: N/A; . ESOPHAGOGASTRODUODENOSCOPY (EGD) WITH PROPOFOL N/A 02/16/2014  Procedure: ESOPHAGOGASTRODUODENOSCOPY (EGD) WITH PROPOFOL;  Surgeon: Milus Banister, MD;  Location: [facility];  Service: Endoscopy;  Laterality: N/A; . EUS   . HOT HEMOSTASIS  05/29/2011  Procedure: HOT HEMOSTASIS (ARGON PLASMA  COAGULATION/BICAP);  Surgeon: Gatha Mayer, MD;  Location: [facility];  Service: Endoscopy;  Laterality: N/A; . LUMBAR LAMINECTOMY   . ROTATOR CUFF REPAIR Bilateral  . TONSILLECTOMY  age 77 HPI: Pt is a 68yoF with hx Polymyositis, CVA, Gastric leiomyoma, HTN, GERD, and dCHF, who presented to ER with BRBPR, hypotension, and AMS. Difficult intubation 8/5, extubated 8/8. EGD showed no signs of bleeding. CXR post-intubation showed L PTX. CT Head negative. BSE 06/13/17 recommended Dys 3 diet and thin liquids, advanced to regular textures prior to d/c. MBS in May 2019 showed an overall functional swallow with a single incident of flash penetration. Pt was noted to cough during MBS but not in the setting of airway compromise. There was also concern for a prominent CP and a possible Zenker's diverticulum developing, but no backflow was observed. Dys 3 diet and thin liquids were recommended at that time.  Subjective: Pt seen in radiology for MBS to detemine appropriateness for any po intake or therapeutic feeds with speech therapy Assessment / Plan / Recommendation CHL IP CLINICAL IMPRESSIONS 08/27/2017 Clinical Impression Pt presents with severe oral, moderate pharyngeal dysphagia, characterized as follows: ORALLY, pt exhibits minimal labial movement/seal, significantly poor oral prep and propulsion, with anterior leakage, oral holding, and poor bolus formation noted across consistencies. Nectar thick liquid was suctioned from pt oral cavity, as no posterior propulsion or swallow occurred. Poor bolus control and posterior propulsion also noted with thin liquid and puree trials, with post-swallow oral residue seen. PHARYNGEALLY, pt exhibits delayed swallow reflex, with trigger noted at the pyriform sinus. Aspiration of thin liquid via straw was noted during the swallow, with minimal and ineffective cough response elicited. No penetration or aspiration of puree consistency was noted, however, pt is at significantly high risk  given poor oral prep and delay of reflex/airway closure. At this time, strict NPO status is recommended for primary nutrition, hydration, and medications. When alert and participatory, pt may benefit from therapeutic trials of individual ice chips and small boluses of puree during dysphagia therapy with speech pathology only. SLP will follow for trial of therapy and education. RN and MD informed of results and recommendations.  SLP Visit Diagnosis Dysphagia, oropharyngeal phase (R13.12) Impact on safety and function Severe aspiration risk;Risk for inadequate nutrition/hydration   CHL IP TREATMENT RECOMMENDATION 08/27/2017 Treatment Recommendations Therapy as outlined in treatment plan below   Prognosis 08/27/2017 Prognosis for Safe Diet Advancement Fair Barriers  to Reach Goals Cognitive deficits CHL IP DIET RECOMMENDATION 08/27/2017 SLP Diet Recommendations NPO;Alternative means - long-term;Other (Comment) Medication Administration Via alternative means   CHL IP OTHER RECOMMENDATIONS 08/27/2017 Oral Care Recommendations Oral care QID Other Recommendations Have oral suction available   CHL IP FOLLOW UP RECOMMENDATIONS 08/27/2017 Follow up Recommendations Skilled Nursing facility;24 hour supervision/assistance   CHL IP FREQUENCY AND DURATION 08/27/2017 Speech Therapy Frequency (ACUTE ONLY) min 2x/week Treatment Duration 2 weeks      CHL IP ORAL PHASE 08/27/2017 Oral Phase Impaired Oral - Nectar Teaspoon Right anterior bolus loss;Impaired mastication;Lingual pumping;Reduced posterior propulsion;Holding of bolus;Right pocketing in lateral sulci;Left pocketing in lateral sulci;Pocketing in anterior sulcus;Lingual/palatal residue;Piecemeal swallowing;Delayed oral transit;Decreased bolus cohesion;Premature spillage Oral - Thin Straw Right anterior bolus loss;Lingual pumping;Reduced posterior propulsion;Holding of bolus;Right pocketing in lateral sulci;Left pocketing in lateral sulci;Pocketing in anterior sulcus;Lingual/palatal  residue;Piecemeal swallowing;Delayed oral transit;Decreased bolus cohesion;Premature spillage Oral - Puree Impaired mastication;Weak lingual manipulation;Lingual pumping;Reduced posterior propulsion;Holding of bolus;Right pocketing in lateral sulci;Left pocketing in lateral sulci;Pocketing in anterior sulcus;Lingual/palatal residue;Piecemeal swallowing;Delayed oral transit;Decreased bolus cohesion;Premature spillage  CHL IP PHARYNGEAL PHASE 08/27/2017 Pharyngeal Phase Impaired Pharyngeal- Nectar Teaspoon No swallow elicited - nectar thick material was suctioned from oral cavity.  Pharyngeal- Thin Straw Delayed swallow initiation-pyriform sinuses;Reduced laryngeal elevation;Reduced airway/laryngeal closure;Reduced tongue base retraction;Penetration/Aspiration during swallow;Moderate aspiration;Pharyngeal residue - valleculae Pharyngeal Material enters airway, passes BELOW cords and not ejected out despite cough attempt by patient - cough was weak and ineffective in removing aspirate. Pharyngeal- Puree Delayed swallow initiation-pyriform sinuses;Reduced anterior laryngeal mobility;Reduced laryngeal elevation;Reduced tongue base retraction;Pharyngeal residue - valleculae  CHL IP CERVICAL ESOPHAGEAL PHASE 08/27/2017 Cervical Esophageal Phase [facility] Celia B. Quentin [facility], CCC-SLP Speech Language Pathologist [phone] [name] Chock 08/27/2017, 4:09 PM              Ct Angio Abd/pel W/ And/or W/o  Result Date: 08/17/2017 CLINICAL DATA:  Gastrointestinal bleeding EXAM: CTA ABDOMEN AND PELVIS wITHOUT AND WITH CONTRAST TECHNIQUE: Multidetector CT imaging of the abdomen and pelvis was performed using the standard protocol during bolus administration of intravenous contrast. Multiplanar reconstructed images and MIPs were obtained and reviewed to evaluate the vascular anatomy. CONTRAST:  165mL ISOVUE-370 IOPAMIDOL (ISOVUE-370) INJECTION 76% COMPARISON:  05/15/2017 FINDINGS: VASCULAR Gastrointestinal bleeding: There is no blush  of contrast within the gastrointestinal tract to suggest active gastrointestinal bleeding. Aorta: Nonaneurysmal and patent. Scattered atherosclerotic calcifications. Celiac: Severe narrowing just beyond the origin. Branch vessels patent. SMA: Patent and diminutive. Renals: Bilateral single renal arteries are patent. IMA: Diminutive and patent. Inflow: Common, internal, and external iliac arteries are patent. Proximal Outflow: Grossly patent Veins: No obvious DVT. Review of the MIP images confirms the above findings. NON-VASCULAR Lower chest: There is consolidation at the left lung base. A large left pneumothorax is present. There is a large amount of mediastinal emphysema. There is a tiny right anterior pneumothorax. There is emphysema in the anterior chest soft tissues. Hepatobiliary: Tiny hypodensity in the right lobe of the liver towards the dome. It is stable. Gallbladder is within normal limits. Pancreas: Unremarkable Spleen: Unremarkable Adrenals/Urinary Tract: Benign cysts are present in both kidneys. There is high-density material in the collecting system of both kidneys which conforms to the collecting system likely representing a test dose of contrast with excretion. Adrenal glands are within normal limits. Foley catheter decompresses the bladder. There is bladder wall thickening. Stomach/Bowel: Stomach and duodenum are decompressed. No evidence of small-bowel obstruction. Sigmoid diverticulosis is present. There is no evidence of acute diverticulitis. Lymphatic: No abnormal  retroperitoneal adenopathy. Reproductive: Uterus is absent.  Adnexa are within normal limits. Other: No free fluid. Musculoskeletal: Postoperative changes from posterior L4-5 fusion. L4 superior anterior endplate compression fracture is stable. IMPRESSION: VASCULAR No evidence of gastrointestinal bleeding. Severe narrowing just beyond the origin of the celiac axis. NON-VASCULAR Consolidation at the left lung base. Large left  pneumothorax. Tiny right pneumothorax. Extensive pneumomediastinum. Postoperative changes in the lumbar spine. Stable L4 compression deformity. Findings related to the left pneumothorax were discussed with Dr. Debbora Dus. Electronically Signed   By: Marybelle Killings M.D.   On: 08/17/2017 18:47     CBC Recent Labs  Lab 08/22/17 0425 08/23/17 0415 08/24/17 0500 08/25/17 0414 08/26/17 0340 08/27/17 0324 08/27/17 1622 08/27/17 1821 08/28/17 1134  WBC 10.2 8.8 8.5 9.4 7.6 11.3*  --   --  13.2*  HGB 10.3* 10.1* 9.2* 9.2* 9.5* 9.3* 5.9* 10.1* 10.7*  HCT 31.8* 31.2* 28.5* 28.3* 30.0* 29.4* 19.4* 32.3* 34.1*  PLT 221 248 217 232 287 PLATELET CLUMPS NOTED ON SMEAR, UNABLE TO ESTIMATE  --   --  313  MCV 80.7 81.3 81.2 81.8 82.6 83.1  --   --  84.6  MCH 26.1 26.3 26.2 26.6 26.2 26.3  --   --  26.6  MCHC 32.4 32.4 32.3 32.5 31.7 31.6  --   --  31.4  RDW 18.0* 17.9* 18.1* 18.1* 18.1* 18.4*  --   --  17.8*  LYMPHSABS 1.0 0.8  --   --   --   --   --   --   --   MONOABS 1.0 0.8  --   --   --   --   --   --   --   EOSABS 0.0 0.0  --   --   --   --   --   --   --   BASOSABS 0.0 0.0  --   --   --   --   --   --   --     Chemistries  Recent Labs  Lab 08/23/17 0415 08/24/17 0500 08/25/17 0414 08/26/17 0340 08/28/17 1134  NA 139 142 138 139 140  K 3.3* 2.8* 3.0* 3.8 4.7  CL 97* 101 100 103 105  CO2 29 28 28 27 27   GLUCOSE 90 78 206* 111* 104*  BUN 10 8 5* 10 13  CREATININE 0.50 0.34* 0.47 0.43* 0.43*  CALCIUM 8.8* 8.4* 8.2* 8.4* 8.7*  MG  --   --  1.7 2.3 2.1  AST  --   --  28 29  --   ALT  --   --  25 26  --   ALKPHOS  --   --  42 53  --   BILITOT  --   --  0.8 0.6  --    ------------------------------------------------------------------------------------------------------------------ No results for input(s): CHOL, HDL, LDLCALC, TRIG, CHOLHDL, LDLDIRECT in the last 72 hours.  Lab Results  Component Value Date   HGBA1C 4.7 (L) 06/10/2017    ------------------------------------------------------------------------------------------------------------------ No results for input(s): TSH, T4TOTAL, T3FREE, THYROIDAB in the last 72 hours.  Invalid input(s): FREET3 ------------------------------------------------------------------------------------------------------------------ No results for input(s): VITAMINB12, FOLATE, FERRITIN, TIBC, IRON, RETICCTPCT in the last 72 hours.  Coagulation profile No results for input(s): INR, PROTIME in the last 168 hours.  No results for input(s): DDIMER in the last 72 hours.  Cardiac Enzymes No results for input(s): CKMB, TROPONINI, MYOGLOBIN in the last 168 hours.  Invalid input(s): CK ------------------------------------------------------------------------------------------------------------------ No results found for:  BNP   Emaly Boschert M.D on 08/28/2017 at 1:27 PM   Go to [email or web address] - password [email or web address] for contact info  [facility] - Office  [phone]  Called patient's son to update plan of care who picked up the phone, call discontinued.  Reattempted, going to voicemail

## 2017-08-28 NOTE — Progress Notes (Signed)
Physical Therapy Treatment Patient Details Name: [name] [name] MRN: [id number] DOB: July 30, 1940 Today's Date: 08/28/2017    History of Present Illness 77 y.o. female admitted on 08/17/17 for rectal bleeding.  Pt dx with GIB with hematochezia (s/p EGD with no signs of UGIB s/p 2 units PRBCs), syncope related to possible hemorrhagic shock vs PTX vs sepsis requiring intubation 08/17/17-08/20/17.  PTX treated with L chest tube 8/5-08/21/17.  Pt with other significant PMH of polymyositis, CVA, chronic diastolic heart failure, bil RTC repair, and lumbar laminectomy.    PT Comments    Patient maintaining current level of mobility. Progressing to edge of bed with two person maximal assistance. Session focused on static and dynamic seated balance for core and trunk stabilization/control. Transfer deferred this session due to BP being outside stated parameters, BP 180/100; RN, Anisha notified. SNF remains appropriate discharge recommendation.   Follow Up Recommendations  SNF     Equipment Recommendations  Hospital bed;Wheelchair cushion (measurements PT);Wheelchair (measurements PT);Other (comment)(hoyer lift)    Recommendations for Other Services       Precautions / Restrictions Precautions Precautions: Fall Precaution Comments: right side weaker than left Restrictions Weight Bearing Restrictions: No    Mobility  Bed Mobility Overal bed mobility: Needs Assistance Bed Mobility: Supine to Sit;Sit to Supine     Supine to sit: +2 for physical assistance;Max assist;HOB elevated Sit to supine: +2 for physical assistance;Max assist;HOB elevated   General bed mobility comments: Pt needed assist at trunk and legs to progress to EOB and back to supine. Some initiation noted  Transfers                 General transfer comment: NT secondary to vitals and pt appearing nauseous  Ambulation/Gait                 Stairs             Wheelchair Mobility    Modified Rankin  (Stroke Patients Only)       Balance Overall balance assessment: Needs assistance Sitting-balance support: Feet supported;Bilateral upper extremity supported Sitting balance-Leahy Scale: Poor Sitting balance - Comments: min-mod assist provided intermittently due to lateral lean                                    Cognition Arousal/Alertness: Awake/alert Behavior During Therapy: Flat affect Overall Cognitive Status: Impaired/Different from baseline Area of Impairment: Orientation;Attention;Memory;Safety/judgement;Following commands;Awareness;Problem solving                 Orientation Level: Disoriented to;Place;Time;Situation Current Attention Level: Sustained Memory: Decreased recall of precautions;Decreased short-term memory Following Commands: Follows one step commands inconsistently Safety/Judgement: Decreased awareness of safety;Decreased awareness of deficits Awareness: Intellectual Problem Solving: Slow processing;Decreased initiation;Difficulty sequencing;Requires tactile cues;Requires verbal cues General Comments: Unable to respond to yes/no questions but following simple commands occasionally.       Exercises General Exercises - Lower Extremity Long Arc Quad: AAROM;Both;10 reps Other Exercises Other Exercises: Static and dynamic sitting balance EOB x ~15 minutes including progressing to hands in lap, tactile cueing for achieving midline and lowering down onto left elbow and then back to midline with assist for core/trunk control    General Comments        Pertinent Vitals/Pain Pain Assessment: Faces Faces Pain Scale: Hurts little more Pain Location: generalized with movement. mildly restless Pain Descriptors / Indicators: Restless Pain Intervention(s): Monitored during session    Home  Living                      Prior Function            PT Goals (current goals can now be found in the care plan section) Acute Rehab PT  Goals Patient Stated Goal: Unable to state PT Goal Formulation: Patient unable to participate in goal setting Time For Goal Achievement: 09/04/17 Potential to Achieve Goals: Fair    Frequency    Min 2X/week      PT Plan Current plan remains appropriate    Co-evaluation              AM-PAC PT "6 Clicks" Daily Activity  Outcome Measure  Difficulty turning over in bed (including adjusting bedclothes, sheets and blankets)?: Unable Difficulty moving from lying on back to sitting on the side of the bed? : Unable Difficulty sitting down on and standing up from a chair with arms (e.g., wheelchair, bedside commode, etc,.)?: Unable Help needed moving to and from a bed to chair (including a wheelchair)?: Total Help needed walking in hospital room?: Total Help needed climbing 3-5 steps with a railing? : Total 6 Click Score: 6    End of Session   Activity Tolerance: Patient limited by fatigue Patient left: in bed;with call bell/phone within reach;with bed alarm set Nurse Communication: Mobility status PT Visit Diagnosis: Muscle weakness (generalized) (M62.81);Difficulty in walking, not elsewhere classified (R26.2);Other symptoms and signs involving the nervous system (R29.898)     Time: 9675-9163 PT Time Calculation (min) (ACUTE ONLY): 17 min  Charges:  $Therapeutic Activity: 8-22 mins                     Ellamae Sia, PT, DPT Acute Rehabilitation Services  Pager: [phone]   Willy Eddy 08/28/2017, 1:28 PM

## 2017-08-29 ENCOUNTER — Inpatient Hospital Stay (HOSPITAL_COMMUNITY): Payer: Medicare Other

## 2017-08-29 LAB — URINALYSIS, ROUTINE W REFLEX MICROSCOPIC
Bilirubin Urine: NEGATIVE
Glucose, UA: NEGATIVE mg/dL
Ketones, ur: NEGATIVE mg/dL
Nitrite: NEGATIVE
PH: 8 (ref 5.0–8.0)
Protein, ur: 30 mg/dL — AB
SPECIFIC GRAVITY, URINE: 1.013 (ref 1.005–1.030)

## 2017-08-29 LAB — GLUCOSE, CAPILLARY
GLUCOSE-CAPILLARY: 114 mg/dL — AB (ref 70–99)
GLUCOSE-CAPILLARY: 114 mg/dL — AB (ref 70–99)
GLUCOSE-CAPILLARY: 123 mg/dL — AB (ref 70–99)
GLUCOSE-CAPILLARY: 152 mg/dL — AB (ref 70–99)
GLUCOSE-CAPILLARY: 183 mg/dL — AB (ref 70–99)
GLUCOSE-CAPILLARY: 91 mg/dL (ref 70–99)

## 2017-08-29 LAB — CBC
HEMATOCRIT: 31.7 % — AB (ref 36.0–46.0)
HEMOGLOBIN: 9.9 g/dL — AB (ref 12.0–15.0)
MCH: 26.5 pg (ref 26.0–34.0)
MCHC: 31.2 g/dL (ref 30.0–36.0)
MCV: 84.8 fL (ref 78.0–100.0)
Platelets: 310 10*3/uL (ref 150–400)
RBC: 3.74 MIL/uL — AB (ref 3.87–5.11)
RDW: 17.8 % — ABNORMAL HIGH (ref 11.5–15.5)
WBC: 19.8 10*3/uL — ABNORMAL HIGH (ref 4.0–10.5)

## 2017-08-29 MED ORDER — AMLODIPINE BESYLATE 10 MG PO TABS
10.0000 mg | ORAL_TABLET | Freq: Every day | ORAL | Status: DC
  Administered 2017-08-29 – 2017-09-08 (×9): 10 mg via ORAL
  Filled 2017-08-29 (×11): qty 1

## 2017-08-29 MED ORDER — AMLODIPINE 1 MG/ML ORAL SUSPENSION: 10.0000 mg | Freq: Every day | ORAL | Status: DC

## 2017-08-29 MED ORDER — POTASSIUM CHLORIDE 20 MEQ/15ML (10%) PO SOLN
20.0000 meq | Freq: Every day | ORAL | Status: DC
  Administered 2017-08-30 – 2017-08-31 (×2): 20 meq
  Filled 2017-08-29 (×2): qty 15

## 2017-08-29 MED ORDER — METOPROLOL TARTRATE 25 MG/10 ML ORAL SUSPENSION
50.0000 mg | Freq: Two times a day (BID) | ORAL | Status: DC
  Administered 2017-08-29 – 2017-08-30 (×3): 50 mg
  Filled 2017-08-29 (×4): qty 20

## 2017-08-29 NOTE — Progress Notes (Signed)
Patient Demographics:    [name] [name], is a 77 y.o. female, DOB - June 15, 1940, AJG:[id number]  Admit date - 08/17/2017   Admitting Physician [name], MD  Outpatient Primary MD for the patient is [name], MD  LOS - 12   Chief Complaint  Patient presents with  . Rectal Bleeding        Subjective:   Per nursing staff patient did not have any episodes of bloody stools.  Tolerating the feeds well.  Patient follows simple commands well.  Patient failed modified barium swallow   Active Problems:   Polymyositis (HCC)   Adrenal insufficiency (HCC)   Acute blood loss anemia   CVA (cerebral vascular accident) ([place])   Acute upper GI bleed   Nontraumatic hemorrhagic shock ([place])   Brief Narrative:  77yo female SNF resident with a hx of polymyositis, CVA with complete right hemiparesis, contracted, gastric leiomyoma, hypertension, GERD, and diastolic CHF who was brought to the ED w/ bright red blood per rectum. She had episodes of hypotension and unresponsiveness in the ED and required intubation. She was transfused 2 unit of PRBC transfusion. Chest x-ray post intubation showed left pneumothorax. She was extubated on 8/8.  Gastroenterology was consulted, underwent EGD.  CT abdomen and pelvis without any acute abnormality.  Aspirin Plavix have been on hold.  Considering patient being contracted, no further endoscopic work-up is planned.  Patient had few other episodes of bloody stools, however improved.  Patient failed swallow evaluation multiple times.  Dobbhoff tube is placed.  Patient's family is meeting with the palliative care on 08/30/2017 for further decide about goals of care.  patient was initially DNR/DNI.  Patient's son changed CODE STATUS to full code until the family meeting.  Patient has been tachycardic, WBC count trended up in the last 72 hours.  Per nursing staff no further episodes of bloody  stools.  Significant Events: 8/5 admit - intubated in ED - L sided chest tube placed  8/8 extubated      Assessment & Plan:  ##Hematochezia - Hemorrhagic shock--- resolved hemorrhagic shock, one episode of BM with blood on 08/26/2017,  -H&H has been stable, the globin on 08/26/2017 is 9.5, continue to monitor H&H and transfuse as clinically indicated - EGD on 08/17/17 by Dr Lyndel Safe negative  - s/p 3U PRBC  -CT abdom/pelvis w/o acute findings  -cont to holdAspirin, Plavix  -diverticular v/s recurrent colonic AVM - no further endoscopic work-up planned -Hemoglobin stable     ## Acute hypoxic respiratory failure  - Pulmonary edema / B pleural effusions - Aspiration pneumonia --- improved from a respiratory standpoint, Intubated 08/17/17 and Extubated on 08/20/17  - resolved w/ pt now on RA - off abx    ##Dysphagia - Has failed numerous swallow studies  - family agreed to have Cortrak placed 08/24/17 after 3 failed attempts at NG placement 08/23/17  - tolerating Cortrak and tube feeding well thus far - d/w speech pathologist Ms. Buechet on 08/26/2017, at this time patient's son Evangeline Gula states that they are not ready to make a decision on comfort measures versus PEG tube placement yet they want to continue to feed through Dobbhoff tube /Cortrax for now. -Palliative care has been consulted, plan to continue the speech therapy, if  no improvement, if family does not agree for PEG tube consider hospice care -Modified barium swallow-failed.  Recommended n.p.o. -Family meeting is scheduled with the palliative care tomorrow for PEG tube versus hospice -Plan to discharge patient to SNF once nutritional goals are decided  ##Leukocytosis -Get urine analysis, chest x-ray -Follow-up with CBC a.m.  ##Acute encephalopathy metabolic - multi-factorial, ikely due to hypoperfusion, aspiration pneumonia, + Hx of CVA - head CT noted mild diffuse cerebral and cerebellar atrophy,marked chronic white matter  ischemic change  - at baseline patient was able to hold a conversation, withno memory deficits or dementia per son  - B12 and folate not low - avoid sedatives -Currently following simple commands  ##Hypertension --Discontinue hydralazine 25 mg 3 times daily, Imdur -Increase metoprolol 50 mg twice daily - may use IV Hydralazine 10 mg  Every 4 hours Prn for systolic blood pressure over 160 mmhg -Start the patient on amlodipine as blood pressure is poorly controlled  ##Left-sided pneumothorax - chest tube removed - resolved - felt to be related to intubation/positive pressure ventilation   ##Polymyositis - Chronic adrenal insufficiency - OnCellCept and prednisone - follows with Rheumatologist (Dr. 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 SNF  ##FEN--continue Osmolite 1.2 tube feed at 25 mL an hour, titrate  DVT prophylaxis: SCDs (Gi Bleed) Code Status: DNR - NO CODE Family Communication:   Attempted to call son [place].  No response Disposition Plan: TBD  Consultants:  PCCM GI Palliative Speech  Antimicrobials:  Unasyn 8/7 > 8/13   Lab Results  Component Value Date   PLT 310 08/29/2017    Inpatient Medications  Scheduled Meds: . chlorhexidine  15 mL Mouth Rinse BID  . Chlorhexidine Gluconate Cloth  6 each Topical Daily  . feeding supplement (PRO-STAT SUGAR FREE 64)  30 mL Per Tube Daily  . hydrALAZINE  25 mg Per Tube TID  . isosorbide dinitrate  5 mg Per Tube TID  . mouth rinse  15 mL Mouth Rinse q12n4p  . metoprolol tartrate  25 mg Per Tube TID PC  .  mycophenolate  1,000 mg Per Tube BID  . pantoprazole sodium  40 mg Per Tube Daily  . potassium chloride  20 mEq Per Tube TID  . predniSONE  20 mg Per Tube BID  . sodium chloride flush  3 mL Intravenous Q12H   Continuous Infusions: . sodium chloride 10 mL/hr at 08/28/17 2229  . feeding supplement (OSMOLITE 1.2 CAL) 1,000 mL (08/28/17 2223)   PRN Meds:.acetaminophen (TYLENOL) oral liquid 160 mg/5 mL, hydrALAZINE, morphine injection, [DISCONTINUED] ondansetron **OR** ondansetron (ZOFRAN) IV, sodium chloride flush  Anti-infectives (From admission, onward)   Start     Dose/Rate Route Frequency Ordered Stop   08/19/17 1400  Ampicillin-Sulbactam (UNASYN) 3 g in sodium chloride 0.9 % 100 mL IVPB  Status:  Discontinued     3 g 200 mL/hr over 30 Minutes Intravenous Every 8 hours 08/19/17 1103 08/25/17 1220   08/18/17 1000  piperacillin-tazobactam (ZOSYN) IVPB 3.375 g  Status:  Discontinued     3.375 g 12.5 mL/hr over 240 Minutes Intravenous  Every 8 hours 08/18/17 0512 08/19/17 1103   08/18/17 0515  piperacillin-tazobactam (ZOSYN) IVPB 3.375 g     3.375 g 100 mL/hr over 30 Minutes Intravenous  Once 08/18/17 0512 08/18/17 0631        Objective:   Vitals:   08/28/17 1829 08/28/17 2213 08/29/17 0440 08/29/17 1138  BP: (!) 166/67 (!) 166/76 140/83 (!) 141/75  Pulse: (!) 113 (!) 102 (!) 118 (!) 101  Resp: 18 20 18  (!) 24  Temp: 98.3 F (36.8 C) 99.2 F (37.3 C) 98 F (36.7 C) 98.6 F (37 C)  TempSrc: Oral Oral  Oral  SpO2: 99% 99% 99% 98%  Weight:      Height:        Wt Readings from Last 3 Encounters:  08/27/17 65.1 kg  07/24/17 65.3 kg  07/21/17 65.5 kg     Intake/Output Summary (Last 24 hours) at 08/29/2017 1409 Last data filed at 08/29/2017 0600 Gross per 24 hour  Intake 1439.65 ml  Output 1350 ml  Net 89.65 ml     Physical Exam  Gen:- Awake Alert,  In no apparent distress , nonverbal, HEENT:- [place].AT, No sclera icterus Nose-contrax tube with tube feeding  running Neck-Supple Neck,No JVD,.  Lungs-diminished in bases, no wheezing CV- S1, S2 normal, right-sided Port-A-Cath Abd-  +ve B.Sounds, Abd Soft, No tenderness,    Extremity/Skin:- No  edema,   warm and dry Psych-Limited psych exam due to patient being nonverbal and unable to follow commands or instructions,  neuro-no new focal deficits, no tremors (at baseline apparently patient is mostly bedbound) GU- Foley   Data Review:   Micro Results No results found for this or any previous visit (from the past 240 hour(s)).  Radiology Reports Ct Angio Head W Or Wo Contrast  Result Date: 08/17/2017 CLINICAL DATA:  Focal neuro deficit. Altered mental status. Periods of unresponsiveness EXAM: CT ANGIOGRAPHY HEAD AND NECK TECHNIQUE: Multidetector CT imaging of the head and neck was performed using the standard protocol during bolus administration of intravenous contrast. Multiplanar CT image reconstructions and MIPs were obtained to evaluate the vascular anatomy. Carotid stenosis measurements (when applicable) are obtained utilizing NASCET criteria, using the distal internal carotid diameter as the denominator. CONTRAST:  119mL ISOVUE-370 IOPAMIDOL (ISOVUE-370) INJECTION 76% COMPARISON:  Noncontrast head CT earlier today. CTA head neck 06/09/2017 FINDINGS: CTA NECK FINDINGS Aortic arch: Normal.  Three vessel branching. Right carotid system: Limited by motion at the distal ICA. Visible vessels are smooth and widely patent. Left carotid system: Limited by motion at the distal left ICA. Visible vessels are smooth and widely patent. Vertebral arteries: No proximal subclavian flow limiting stenosis. Both vertebral arteries are patent in the neck. Skeleton: No acute finding Other neck: No acute finding. Upper chest: Porta catheter on the right.  No acute finding. Review of the MIP images confirms the above findings CTA HEAD FINDINGS Anterior circulation: No evidence of emergent large vessel occlusion or proximal flow  limiting stenosis atherosclerotic narrowing and irregularity of medium size vessels (M2 level and beyond) that is extensive, see markings on MIP images. Negative for aneurysm. Posterior circulation: The vertebral and basilar arteries are smooth and widely patent. Symmetric flow in bilateral branches. Atherosclerotic irregularity of left more than right PCA branches. Negative for aneurysm Venous sinuses: Patent Anatomic variants: None significant Delayed phase: Not obtained Review of the MIP images confirms the above findings IMPRESSION: 1. No emergent large vessel occlusion or other acute finding. Stable from 06/09/2017. 2. Extensive atherosclerotic irregularity and  narrowing of intracranial medium size vessels. 3. No significant atherosclerosis or stenosis in the neck. Electronically Signed   By: Monte Fantasia M.D.   On: 08/17/2017 11:31   Dg Chest 1 View  Result Date: 08/22/2017 CLINICAL DATA:  Hypoxia, altered mental status EXAM: CHEST  1 VIEW COMPARISON:  the previous day's study FINDINGS: The left pigtail drain catheter is been removed. No pneumothorax evident. Blunting of the left lateral costophrenic angle suggesting small effusion. Stable right IJ port catheter. Atelectasis/infiltrate in the lung bases left greater than right, improved since prior study. Heart size upper limits normal for technique. Aortic Atherosclerosis (ICD10-170.0). Regional bones unremarkable. IMPRESSION: 1. Interval removal of left chest tube with no pneumothorax. 2. Possible small left pleural effusion. 3. Bibasilar atelectasis/consolidation, improved since previous exam Electronically Signed   By: Lucrezia Europe M.D.   On: 08/22/2017 10:04   Ct Head Wo Contrast  Result Date: 08/19/2017 CLINICAL DATA:  Altered mental status.  Cephalopathy. EXAM: CT HEAD WITHOUT CONTRAST TECHNIQUE: Contiguous axial images were obtained from the base of the skull through the vertex without intravenous contrast. COMPARISON:  08/17/2017. FINDINGS:  Brain: Diffusely enlarged ventricles and subarachnoid spaces. Patchy white matter low density in both cerebral hemispheres. Streak artifacts from a high density lead attached to the right frontal bone. No intracranial hemorrhage, mass lesion or CT evidence of acute infarction. Vascular: No hyperdense vessel or unexpected calcification. Skull: Normal. Negative for fracture or focal lesion. Sinuses/Orbits: Unremarkable. Other: None. IMPRESSION: 1. No acute abnormality. 2. Stable mild diffuse cerebral and cerebellar atrophy. 3. Stable marked chronic small vessel white matter ischemic changes in both cerebral hemispheres. Electronically Signed   By: Claudie Revering M.D.   On: 08/19/2017 13:50   Ct Head Wo Contrast  Result Date: 08/17/2017 CLINICAL DATA:  Bilateral arm weakness EXAM: CT HEAD WITHOUT CONTRAST TECHNIQUE: Contiguous axial images were obtained from the base of the skull through the vertex without intravenous contrast. COMPARISON:  06/09/2017 brain MRI FINDINGS: Brain: Low-density in the posterior left frontal subcortical white matter is in close approximation to infarcts seen on 06/09/2017 brain MRI. There is a background of advanced chronic small vessel ischemia with confluent low-density in the cerebral white matter. Indistinct deep gray nuclei from chronic ischemic injury, including discrete remote lacunar infarct in the right thalamus. No evident hemorrhage, mass, or hydrocephalus. Subcentimeter left parietal inner table calcification considered incidental. Vascular: No hyperdense vessel.  Atherosclerotic calcification. Skull: No evidence of fracture or bone lesion. Sinuses/Orbits: No pathologic finding. Bilateral cataract resection. Other: Motion degraded scan, best obtainable due to patient condition. IMPRESSION: 1. Motion degraded study without definite acute finding. 2. A small left cerebral white matter infarct was likely present on 06/09/2017 brain MRI. 3. Severe chronic small vessel ischemia.  Electronically Signed   By: Monte Fantasia M.D.   On: 08/17/2017 09:19   Ct Angio Neck W Or Wo Contrast  Result Date: 08/17/2017 CLINICAL DATA:  Focal neuro deficit. Altered mental status. Periods of unresponsiveness EXAM: CT ANGIOGRAPHY HEAD AND NECK TECHNIQUE: Multidetector CT imaging of the head and neck was performed using the standard protocol during bolus administration of intravenous contrast. Multiplanar CT image reconstructions and MIPs were obtained to evaluate the vascular anatomy. Carotid stenosis measurements (when applicable) are obtained utilizing NASCET criteria, using the distal internal carotid diameter as the denominator. CONTRAST:  164mL ISOVUE-370 IOPAMIDOL (ISOVUE-370) INJECTION 76% COMPARISON:  Noncontrast head CT earlier today. CTA head neck 06/09/2017 FINDINGS: CTA NECK FINDINGS Aortic arch: Normal.  Three vessel branching.  Right carotid system: Limited by motion at the distal ICA. Visible vessels are smooth and widely patent. Left carotid system: Limited by motion at the distal left ICA. Visible vessels are smooth and widely patent. Vertebral arteries: No proximal subclavian flow limiting stenosis. Both vertebral arteries are patent in the neck. Skeleton: No acute finding Other neck: No acute finding. Upper chest: Porta catheter on the right.  No acute finding. Review of the MIP images confirms the above findings CTA HEAD FINDINGS Anterior circulation: No evidence of emergent large vessel occlusion or proximal flow limiting stenosis atherosclerotic narrowing and irregularity of medium size vessels (M2 level and beyond) that is extensive, see markings on MIP images. Negative for aneurysm. Posterior circulation: The vertebral and basilar arteries are smooth and widely patent. Symmetric flow in bilateral branches. Atherosclerotic irregularity of left more than right PCA branches. Negative for aneurysm Venous sinuses: Patent Anatomic variants: None significant Delayed phase: Not obtained  Review of the MIP images confirms the above findings IMPRESSION: 1. No emergent large vessel occlusion or other acute finding. Stable from 06/09/2017. 2. Extensive atherosclerotic irregularity and narrowing of intracranial medium size vessels. 3. No significant atherosclerosis or stenosis in the neck. Electronically Signed   By: Monte Fantasia M.D.   On: 08/17/2017 11:31   Dg Chest Port 1 View  Result Date: 08/27/2017 CLINICAL DATA:  Dyspnea. EXAM: PORTABLE CHEST 1 VIEW COMPARISON:  Radiograph of August 22, 2017. FINDINGS: Stable cardiomegaly. Right internal jugular Port-A-Cath is unchanged in position. No pneumothorax is noted. Elevated right hemidiaphragm is noted with mild right basilar subsegmental atelectasis. Bony thorax is unremarkable. Feeding tube is seen entering the stomach. Bony thorax is unremarkable. IMPRESSION: Stable cardiomegaly. Elevated right hemidiaphragm is noted with mild right basilar subsegmental atelectasis. Interval placement of feeding tube seen entering stomach. Electronically Signed   By: Marijo Conception, M.D.   On: 08/27/2017 09:20   Dg Chest Port 1 View  Result Date: 08/21/2017 CLINICAL DATA:  History of pneumothorax.  Chest tube in place. EXAM: PORTABLE CHEST 1 VIEW COMPARISON:  August 20, 2016 FINDINGS: No pneumothorax. The left chest tube is stable. A layering effusion with underlying opacity in seen on the right. Probable edema. No nodules or masses. Stable cardiomediastinal silhouette. Stable right Port-A-Cath. IMPRESSION: 1. Left-sided chest tube.  No pneumothorax. 2. Layering effusions bilaterally, right greater than left underlying atelectasis. 3. Pulmonary edema. Electronically Signed   By: Dorise Bullion III M.D   On: 08/21/2017 09:49   Dg Chest Port 1 View  Result Date: 08/20/2017 CLINICAL DATA:  Assess for pneumothorax. EXAM: PORTABLE CHEST 1 VIEW COMPARISON:  Chest radiograph performed earlier today at 4:32 a.m. FINDINGS: A right-sided chest port is noted ending  about the mid SVC. Small bilateral pleural effusions are noted. Increased interstitial markings raise concern for pulmonary edema, worsened from the prior study. No pneumothorax is seen. A left chest pigtail catheter is noted in essentially unchanged position. The cardiomediastinal silhouette is borderline normal in size. No acute osseous abnormalities are seen. IMPRESSION: 1. No pneumothorax seen. Left chest pigtail catheter noted in essentially unchanged position. 2. Small bilateral pleural effusions noted. Increased interstitial markings raise concern for pulmonary edema, worsened from the prior study. Electronically Signed   By: Garald Balding M.D.   On: 08/20/2017 22:50   Dg Chest Port 1 View  Result Date: 08/20/2017 CLINICAL DATA:  Respiratory failure. EXAM: PORTABLE CHEST 1 VIEW COMPARISON:  08/19/2017 FINDINGS: Endotracheal tube remains present with the tip approximately 3 cm above the  carina. Stable positioning pigtail chest tube on the left and right-sided Port-A-Cath. Gastric decompression tube extends below the diaphragm. Lungs show slight increase in right basilar atelectasis. The left lower lobe shows improved aeration. No overt edema, evidence of pneumothorax or pleural fluid. IMPRESSION: Improved aeration of the left lower lung. Slight increase in right basilar atelectasis. Electronically Signed   By: Aletta Edouard M.D.   On: 08/20/2017 11:08   Dg Chest Port 1 View  Result Date: 08/19/2017 CLINICAL DATA:  Endotracheal tube is assessment EXAM: PORTABLE CHEST 1 VIEW COMPARISON:  August 18, 2017 FINDINGS: The heart size and mediastinal contours are stable. Endotracheal tube is identified with distal tip 4.5 cm from carina in good position. A nasogastric tube is identified with distal tip not included on film but is at least in the stomach. A right central venous line is identified with distal tip in the superior vena cava, unchanged. Left chest tube is unchanged. There is pulmonary vascular  congestion. Probable left pleural effusion is identified. Consolidation of left lung base is unchanged. The visualized skeletal structures are stable. IMPRESSION: Endotracheal tube is identified with distal tip 4.5 cm from carina, in good position. Pulmonary vascular congestion of the lungs. There is probable left pleural effusion with consolidation of left lung base unchanged compared prior exam. Electronically Signed   By: Abelardo Diesel M.D.   On: 08/19/2017 09:32   Dg Chest Port 1 View  Result Date: 08/18/2017 CLINICAL DATA:  Pneumothorax image timed for 1100hrs.  OG placement. EXAM: PORTABLE CHEST 1 VIEW COMPARISON:  08/18/2017 FINDINGS: Endotracheal tube is in place with tip approximately 3.5 centimeters above the carina. An orogastric tube is in place, tip off the image beyond the gastroesophageal junction. The patient has a RIGHT-sided Port-A-Cath with tip to level of superior vena cava. The patient has a LEFT-sided small bore chest tube. There is no pneumothorax. The heart is enlarged. Patient is rotated towards the LEFT. There is dense opacity at the LEFT lung base which obscures the LEFT hemidiaphragm. Mild subsegmental atelectasis in the RIGHT LOWER lobe, stable in appearance. IMPRESSION: 1. No pneumothorax. 2. Persistent dense LEFT LOWER lobe opacity. 3. Orogastric tube tip beyond the gastroesophageal junction. Electronically Signed   By: Nolon Nations M.D.   On: 08/18/2017 11:31   Dg Chest Port 1 View  Result Date: 08/18/2017 CLINICAL DATA:  Pneumothorax.  Shortness of breath. EXAM: PORTABLE CHEST 1 VIEW COMPARISON:  One-view chest x-ray 08/17/2017. FINDINGS: Endotracheal tube is stable. Right IJ catheter is stable. A left-sided chest tube is in place. No visible pneumothorax is present. Basilar atelectasis is present. Mild pulmonary vascular congestion is noted. IMPRESSION: 1. No significant pneumothorax with left-sided chest tube in place. 2. Left basilar airspace opacity likely representing  small effusion and atelectasis. 3. Support apparatus is stable. Electronically Signed   By: San Morelle M.D.   On: 08/18/2017 07:07   Dg Chest Port 1 View  Result Date: 08/17/2017 CLINICAL DATA:  77 year old female with history of chest tube in the left side. EXAM: PORTABLE CHEST 1 VIEW COMPARISON:  Chest x-ray 08/17/2017. FINDINGS: Compared to the prior examination there has been interval placement of a small bore left chest tube with tip in the apex of the left hemithorax. Previously noted left-sided pneumothorax is significantly decreased in size compared to the prior examination, now likely less than 5-10% of the volume of the left hemithorax. Right internal jugular single-lumen porta cath with tip terminating near the superior cavoatrial junction. Patient is intubated,  with the tip of the endotracheal tube approximately 3 cm above the carina. Lung volumes are low. Persistent elevation of the right hemidiaphragm. Bibasilar linear opacities and linear opacities in the left mid lung, likely to reflect areas of subsegmental atelectasis and/or scarring. No definite consolidative airspace disease. No pleural effusions. No evidence of pulmonary edema. Heart size appears borderline enlarged. The patient is rotated to the left on today's exam, resulting in distortion of the mediastinal contours and reduced diagnostic sensitivity and specificity for mediastinal pathology. Aortic atherosclerosis. IMPRESSION: 1. Support apparatus, as above. 2. Regression of previously noted left-sided pneumothorax following chest tube placement, now extremely small. 3. Low lung volumes with bibasilar areas of subsegmental atelectasis and/or scarring. Electronically Signed   By: Vinnie Langton M.D.   On: 08/17/2017 20:41   Dg Chest Port 1 View  Result Date: 08/17/2017 CLINICAL DATA:  Difficult intubation.  Orogastric tube placement. EXAM: PORTABLE CHEST 1 VIEW COMPARISON:  Portable chest x-ray of Jun 09, 2017 FINDINGS: The  endotracheal tube tip projects 1-2 cm above the carina. There is considerable subcutaneous emphysema however. There is a 10% or less left pneumothorax. There is subsegmental atelectasis in the right lower lung and in the left mid lung. There is subcutaneous emphysema in the left axillary region and bilaterally at the base of the neck. The heart is normal in size. The pulmonary vascularity is not engorged. The porta catheter tip projects over the midportion of the SVC. No esophagogastric tube is observed. IMPRESSION: There is a 10% or less left sided pneumothorax. There is a large amount of subcutaneous emphysema and pneumomediastinum and a small amount of pneumopericardium. The endotracheal tube tip projects approximately 1-2 cm above the carina. Subsegmental atelectasis in both lungs. No CHF.  There is calcification in the wall of the aortic arch. These results will be called to the ordering clinician or representative by the Radiologist Assistant, and communication documented in the PACS or zVision Dashboard. Electronically Signed   By: David  Martinique M.D.   On: 08/17/2017 14:37   Dg Abd Portable 1v  Result Date: 08/23/2017 CLINICAL DATA:  Nasogastric tube placement. EXAM: PORTABLE ABDOMEN - 1 VIEW COMPARISON:  08/18/2017. FINDINGS: Normal bowel gas pattern. No nasogastric tube seen. Mild scoliosis and lower lumbar spine fixation hardware. IMPRESSION: No nasogastric tube visualized. Electronically Signed   By: Claudie Revering M.D.   On: 08/23/2017 11:19   Dg Abd Portable 1v  Result Date: 08/18/2017 CLINICAL DATA:  Pneumothorax image timed for 1100hrs.  OG placement. EXAM: PORTABLE ABDOMEN - 1 VIEW COMPARISON:  CT of the abdomen and pelvis on 08/17/2017 FINDINGS: Orogastric tube has been placed, tip overlying the level of the proximal stomach. Patient is rotated towards the LEFT. There is persistent patchy density at the LEFT lung base. IMPRESSION: Orogastric tube tip to level of the stomach. LEFT LOWER lobe  opacity. Electronically Signed   By: Nolon Nations M.D.   On: 08/18/2017 11:32   Dg Abd Portable 1v  Result Date: 08/17/2017 CLINICAL DATA:  Pre orogastric tube placement radiograph. EXAM: PORTABLE ABDOMEN - 1 VIEW COMPARISON:  Abdominal and pelvic CT scan of May 15, 2017 FINDINGS: There is a moderate amount of gas and stool within the pelvis. The patient has undergone previous lower lumbar fusion procedure. There numerous tubes and catheters overlying the lower pelvis. There is pneumomediastinum and pneumopericardium and likely hydropneumothorax on the left. IMPRESSION: The bowel gas pattern is within the limits of normal. Please see the accompanying chest x-ray dictation regarding  the left-sided pneumothorax, pneumomediastinum, and pneumopericardium. Electronically Signed   By: David  Martinique M.D.   On: 08/17/2017 14:39   Dg Swallowing Func-speech Pathology  Result Date: 08/27/2017 Objective Swallowing Evaluation: Type of Study: MBS-Modified Barium Swallow Study  Patient Details Name: CHIMAMANDA SIEGFRIED MRN: [id number] Date of Birth: December 31, 1940 Today's Date: 08/27/2017 Time: SLP Start Time (ACUTE ONLY): 1500 -SLP Stop Time (ACUTE ONLY): 1520 SLP Time Calculation (min) (ACUTE ONLY): 20 min Past Medical History: Past Medical History: Diagnosis Date . Allergic rhinitis, cause unspecified  . Benign neoplasm of colon  . Chronic diastolic CHF (congestive heart failure) ([place])  . CVA (cerebral vascular accident) ([place]) 05/2017 . Esophageal reflux  . Excessive daytime sleepiness  . 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 GI hemorrhage 2011  recurrent . Hyperlipidemia  . Osteoporosis  . Polymyositis ([place])  . Unspecified essential hypertension  . Vitamin D deficiency  Past Surgical History: Past Surgical History: Procedure Laterality Date . ABDOMINAL HYSTERECTOMY    partial . BACK SURGERY   . COLONOSCOPY  01/09/2011  others also . COLONOSCOPY  05/29/2011  Procedure: COLONOSCOPY;  Surgeon: Gatha Mayer, MD;  Location: [facility]  ENDOSCOPY;  Service: Endoscopy;  Laterality: N/A;  Greggory Brandy Carlean Purl . COLONOSCOPY WITH PROPOFOL N/A 02/16/2014  Procedure: COLONOSCOPY WITH PROPOFOL;  Surgeon: Milus Banister, MD;  Location: [facility] ENDOSCOPY;  Service: Endoscopy;  Laterality: N/A; . ESOPHAGOGASTRODUODENOSCOPY  12/08/2010  others also . ESOPHAGOGASTRODUODENOSCOPY N/A 08/17/2017  Procedure: ESOPHAGOGASTRODUODENOSCOPY (EGD);  Surgeon: Jackquline Denmark, MD;  Location: [facility] ENDOSCOPY;  Service: Endoscopy;  Laterality: N/A; . ESOPHAGOGASTRODUODENOSCOPY (EGD) WITH PROPOFOL N/A 02/16/2014  Procedure: ESOPHAGOGASTRODUODENOSCOPY (EGD) WITH PROPOFOL;  Surgeon: Milus Banister, MD;  Location: [facility] ENDOSCOPY;  Service: Endoscopy;  Laterality: N/A; . EUS   . HOT HEMOSTASIS  05/29/2011  Procedure: HOT HEMOSTASIS (ARGON PLASMA COAGULATION/BICAP);  Surgeon: Gatha Mayer, MD;  Location: [facility];  Service: Endoscopy;  Laterality: N/A; . LUMBAR LAMINECTOMY   . ROTATOR CUFF REPAIR Bilateral  . TONSILLECTOMY  age 37 HPI: Pt is a 65yoF with hx Polymyositis, CVA, Gastric leiomyoma, HTN, GERD, and dCHF, who presented to ER with BRBPR, hypotension, and AMS. Difficult intubation 8/5, extubated 8/8. EGD showed no signs of bleeding. CXR post-intubation showed L PTX. CT Head negative. BSE 06/13/17 recommended Dys 3 diet and thin liquids, advanced to regular textures prior to d/c. MBS in May 2019 showed an overall functional swallow with a single incident of flash penetration. Pt was noted to cough during MBS but not in the setting of airway compromise. There was also concern for a prominent CP and a possible Zenker's diverticulum developing, but no backflow was observed. Dys 3 diet and thin liquids were recommended at that time.  Subjective: Pt seen in radiology for MBS to detemine appropriateness for any po intake or therapeutic feeds with speech therapy Assessment / Plan / Recommendation CHL IP CLINICAL IMPRESSIONS 08/27/2017 Clinical Impression Pt presents with severe oral, moderate pharyngeal  dysphagia, characterized as follows: ORALLY, pt exhibits minimal labial movement/seal, significantly poor oral prep and propulsion, with anterior leakage, oral holding, and poor bolus formation noted across consistencies. Nectar thick liquid was suctioned from pt oral cavity, as no posterior propulsion or swallow occurred. Poor bolus control and posterior propulsion also noted with thin liquid and puree trials, with post-swallow oral residue seen. PHARYNGEALLY, pt exhibits delayed swallow reflex, with trigger noted at the pyriform sinus. Aspiration of thin liquid via straw was noted during the swallow, with minimal and ineffective cough response elicited.  No penetration or aspiration of puree consistency was noted, however, pt is at significantly high risk given poor oral prep and delay of reflex/airway closure. At this time, strict NPO status is recommended for primary nutrition, hydration, and medications. When alert and participatory, pt may benefit from therapeutic trials of individual ice chips and small boluses of puree during dysphagia therapy with speech pathology only. SLP will follow for trial of therapy and education. RN and MD informed of results and recommendations.  SLP Visit Diagnosis Dysphagia, oropharyngeal phase (R13.12) Impact on safety and function Severe aspiration risk;Risk for inadequate nutrition/hydration   CHL IP TREATMENT RECOMMENDATION 08/27/2017 Treatment Recommendations Therapy as outlined in treatment plan below   Prognosis 08/27/2017 Prognosis for Safe Diet Advancement Fair Barriers to Reach Goals Cognitive deficits CHL IP DIET RECOMMENDATION 08/27/2017 SLP Diet Recommendations NPO;Alternative means - long-term;Other (Comment) Medication Administration Via alternative means   CHL IP OTHER RECOMMENDATIONS 08/27/2017 Oral Care Recommendations Oral care QID Other Recommendations Have oral suction available   CHL IP FOLLOW UP RECOMMENDATIONS 08/27/2017 Follow up Recommendations Skilled Nursing  facility;24 hour supervision/assistance   CHL IP FREQUENCY AND DURATION 08/27/2017 Speech Therapy Frequency (ACUTE ONLY) min 2x/week Treatment Duration 2 weeks      CHL IP ORAL PHASE 08/27/2017 Oral Phase Impaired Oral - Nectar Teaspoon Right anterior bolus loss;Impaired mastication;Lingual pumping;Reduced posterior propulsion;Holding of bolus;Right pocketing in lateral sulci;Left pocketing in lateral sulci;Pocketing in anterior sulcus;Lingual/palatal residue;Piecemeal swallowing;Delayed oral transit;Decreased bolus cohesion;Premature spillage Oral - Thin Straw Right anterior bolus loss;Lingual pumping;Reduced posterior propulsion;Holding of bolus;Right pocketing in lateral sulci;Left pocketing in lateral sulci;Pocketing in anterior sulcus;Lingual/palatal residue;Piecemeal swallowing;Delayed oral transit;Decreased bolus cohesion;Premature spillage Oral - Puree Impaired mastication;Weak lingual manipulation;Lingual pumping;Reduced posterior propulsion;Holding of bolus;Right pocketing in lateral sulci;Left pocketing in lateral sulci;Pocketing in anterior sulcus;Lingual/palatal residue;Piecemeal swallowing;Delayed oral transit;Decreased bolus cohesion;Premature spillage  CHL IP PHARYNGEAL PHASE 08/27/2017 Pharyngeal Phase Impaired Pharyngeal- Nectar Teaspoon No swallow elicited - nectar thick material was suctioned from oral cavity.  Pharyngeal- Thin Straw Delayed swallow initiation-pyriform sinuses;Reduced laryngeal elevation;Reduced airway/laryngeal closure;Reduced tongue base retraction;Penetration/Aspiration during swallow;Moderate aspiration;Pharyngeal residue - valleculae Pharyngeal Material enters airway, passes BELOW cords and not ejected out despite cough attempt by patient - cough was weak and ineffective in removing aspirate. Pharyngeal- Puree Delayed swallow initiation-pyriform sinuses;Reduced anterior laryngeal mobility;Reduced laryngeal elevation;Reduced tongue base retraction;Pharyngeal residue - valleculae   CHL IP CERVICAL ESOPHAGEAL PHASE 08/27/2017 Cervical Esophageal Phase [facility] Celia B. Quentin [facility], CCC-SLP Speech Language Pathologist [phone] Shonna Chock 08/27/2017, 4:09 PM              Ct Angio Abd/pel W/ And/or W/o  Result Date: 08/17/2017 CLINICAL DATA:  Gastrointestinal bleeding EXAM: CTA ABDOMEN AND PELVIS wITHOUT AND WITH CONTRAST TECHNIQUE: Multidetector CT imaging of the abdomen and pelvis was performed using the standard protocol during bolus administration of intravenous contrast. Multiplanar reconstructed images and MIPs were obtained and reviewed to evaluate the vascular anatomy. CONTRAST:  159mL ISOVUE-370 IOPAMIDOL (ISOVUE-370) INJECTION 76% COMPARISON:  05/15/2017 FINDINGS: VASCULAR Gastrointestinal bleeding: There is no blush of contrast within the gastrointestinal tract to suggest active gastrointestinal bleeding. Aorta: Nonaneurysmal and patent. Scattered atherosclerotic calcifications. Celiac: Severe narrowing just beyond the origin. Branch vessels patent. SMA: Patent and diminutive. Renals: Bilateral single renal arteries are patent. IMA: Diminutive and patent. Inflow: Common, internal, and external iliac arteries are patent. Proximal Outflow: Grossly patent Veins: No obvious DVT. Review of the MIP images confirms the above findings. NON-VASCULAR Lower chest: There is consolidation at the left lung base. A large left  pneumothorax is present. There is a large amount of mediastinal emphysema. There is a tiny right anterior pneumothorax. There is emphysema in the anterior chest soft tissues. Hepatobiliary: Tiny hypodensity in the right lobe of the liver towards the dome. It is stable. Gallbladder is within normal limits. Pancreas: Unremarkable Spleen: Unremarkable Adrenals/Urinary Tract: Benign cysts are present in both kidneys. There is high-density material in the collecting system of both kidneys which conforms to the collecting system likely representing a test dose of contrast with  excretion. Adrenal glands are within normal limits. Foley catheter decompresses the bladder. There is bladder wall thickening. Stomach/Bowel: Stomach and duodenum are decompressed. No evidence of small-bowel obstruction. Sigmoid diverticulosis is present. There is no evidence of acute diverticulitis. Lymphatic: No abnormal retroperitoneal adenopathy. Reproductive: Uterus is absent.  Adnexa are within normal limits. Other: No free fluid. Musculoskeletal: Postoperative changes from posterior L4-5 fusion. L4 superior anterior endplate compression fracture is stable. IMPRESSION: VASCULAR No evidence of gastrointestinal bleeding. Severe narrowing just beyond the origin of the celiac axis. NON-VASCULAR Consolidation at the left lung base. Large left pneumothorax. Tiny right pneumothorax. Extensive pneumomediastinum. Postoperative changes in the lumbar spine. Stable L4 compression deformity. Findings related to the left pneumothorax were discussed with Dr. Debbora Dus. Electronically Signed   By: Marybelle Killings M.D.   On: 08/17/2017 18:47     CBC Recent Labs  Lab 08/23/17 0415  08/25/17 0414 08/26/17 0340 08/27/17 0324 08/27/17 1622 08/27/17 1821 08/28/17 1134 08/29/17 0849  WBC 8.8   < > 9.4 7.6 11.3*  --   --  13.2* 19.8*  HGB 10.1*   < > 9.2* 9.5* 9.3* 5.9* 10.1* 10.7* 9.9*  HCT 31.2*   < > 28.3* 30.0* 29.4* 19.4* 32.3* 34.1* 31.7*  PLT 248   < > 232 287 PLATELET CLUMPS NOTED ON SMEAR, UNABLE TO ESTIMATE  --   --  313 310  MCV 81.3   < > 81.8 82.6 83.1  --   --  84.6 84.8  MCH 26.3   < > 26.6 26.2 26.3  --   --  26.6 26.5  MCHC 32.4   < > 32.5 31.7 31.6  --   --  31.4 31.2  RDW 17.9*   < > 18.1* 18.1* 18.4*  --   --  17.8* 17.8*  LYMPHSABS 0.8  --   --   --   --   --   --   --   --   MONOABS 0.8  --   --   --   --   --   --   --   --   EOSABS 0.0  --   --   --   --   --   --   --   --   BASOSABS 0.0  --   --   --   --   --   --   --   --    < > = values in this interval not displayed.     Chemistries  Recent Labs  Lab 08/23/17 0415 08/24/17 0500 08/25/17 0414 08/26/17 0340 08/28/17 1134  NA 139 142 138 139 140  K 3.3* 2.8* 3.0* 3.8 4.7  CL 97* 101 100 103 105  CO2 29 28 28 27 27   GLUCOSE 90 78 206* 111* 104*  BUN 10 8 5* 10 13  CREATININE 0.50 0.34* 0.47 0.43* 0.43*  CALCIUM 8.8* 8.4* 8.2* 8.4* 8.7*  MG  --   --  1.7  2.3 2.1  AST  --   --  28 29  --   ALT  --   --  25 26  --   ALKPHOS  --   --  42 53  --   BILITOT  --   --  0.8 0.6  --    ------------------------------------------------------------------------------------------------------------------ No results for input(s): CHOL, HDL, LDLCALC, TRIG, CHOLHDL, LDLDIRECT in the last 72 hours.  Lab Results  Component Value Date   HGBA1C 4.7 (L) 06/10/2017   ------------------------------------------------------------------------------------------------------------------ No results for input(s): TSH, T4TOTAL, T3FREE, THYROIDAB in the last 72 hours.  Invalid input(s): FREET3 ------------------------------------------------------------------------------------------------------------------ No results for input(s): VITAMINB12, FOLATE, FERRITIN, TIBC, IRON, RETICCTPCT in the last 72 hours.  Coagulation profile No results for input(s): INR, PROTIME in the last 168 hours.  No results for input(s): DDIMER in the last 72 hours.  Cardiac Enzymes No results for input(s): CKMB, TROPONINI, MYOGLOBIN in the last 168 hours.  Invalid input(s): CK ------------------------------------------------------------------------------------------------------------------ No results found for: BNP   Blong Busk M.D on 08/29/2017 at 2:09 PM   Go to [email or web address] - password [email or web address] for contact info  [facility] - Office  [phone]  Called patient's son to update plan of care who picked up the phone, call discontinued.  Reattempted, going to voicemail

## 2017-08-30 DIAGNOSIS — M3320 Polymyositis, organ involvement unspecified: Secondary | ICD-10-CM

## 2017-08-30 DIAGNOSIS — R1310 Dysphagia, unspecified: Secondary | ICD-10-CM

## 2017-08-30 DIAGNOSIS — D72829 Elevated white blood cell count, unspecified: Secondary | ICD-10-CM

## 2017-08-30 LAB — BASIC METABOLIC PANEL
ANION GAP: 9 (ref 5–15)
BUN: 20 mg/dL (ref 8–23)
CHLORIDE: 104 mmol/L (ref 98–111)
CO2: 28 mmol/L (ref 22–32)
Calcium: 8.6 mg/dL — ABNORMAL LOW (ref 8.9–10.3)
Creatinine, Ser: 0.45 mg/dL (ref 0.44–1.00)
Glucose, Bld: 144 mg/dL — ABNORMAL HIGH (ref 70–99)
POTASSIUM: 3.8 mmol/L (ref 3.5–5.1)
Sodium: 141 mmol/L (ref 135–145)

## 2017-08-30 LAB — CBC
HCT: 33 % — ABNORMAL LOW (ref 36.0–46.0)
HEMOGLOBIN: 10.4 g/dL — AB (ref 12.0–15.0)
MCH: 26.7 pg (ref 26.0–34.0)
MCHC: 31.5 g/dL (ref 30.0–36.0)
MCV: 84.8 fL (ref 78.0–100.0)
Platelets: 333 10*3/uL (ref 150–400)
RBC: 3.89 MIL/uL (ref 3.87–5.11)
RDW: 17.8 % — ABNORMAL HIGH (ref 11.5–15.5)
WBC: 12.4 10*3/uL — AB (ref 4.0–10.5)

## 2017-08-30 LAB — GLUCOSE, CAPILLARY
GLUCOSE-CAPILLARY: 113 mg/dL — AB (ref 70–99)
GLUCOSE-CAPILLARY: 118 mg/dL — AB (ref 70–99)
GLUCOSE-CAPILLARY: 124 mg/dL — AB (ref 70–99)
GLUCOSE-CAPILLARY: 124 mg/dL — AB (ref 70–99)
GLUCOSE-CAPILLARY: 176 mg/dL — AB (ref 70–99)
Glucose-Capillary: 139 mg/dL — ABNORMAL HIGH (ref 70–99)

## 2017-08-30 MED ORDER — MORPHINE SULFATE (PF) 2 MG/ML IV SOLN
1.0000 mg | INTRAVENOUS | Status: DC | PRN
  Administered 2017-08-30 – 2017-09-02 (×10): 2 mg via INTRAVENOUS
  Filled 2017-08-30 (×10): qty 1

## 2017-08-30 NOTE — Plan of Care (Signed)
  Problem: Clinical Measurements: Goal: Cardiovascular complication will be avoided Outcome: Progressing   Problem: Nutrition: Goal: Adequate nutrition will be maintained Outcome: Not Progressing

## 2017-08-30 NOTE — Progress Notes (Signed)
PROGRESS NOTE    [name] [name]  ZDG:[id number] DOB: 1940-01-23 DOA: 08/17/2017 PCP: [name], MD    Brief Narrative:  77yo female SNF resident with a hx of polymyositis, CVA with complete right hemiparesis, contracted, gastric leiomyoma, hypertension, GERD, and diastolic CHF who was brought to the ED w/ bright red blood per rectum. She had episodes of hypotension and unresponsiveness in the ED and required intubation. She was transfused 2 unit of PRBC transfusion. Chest x-ray post intubation showed left pneumothorax. She was extubated on 8/8.  Gastroenterology was consulted, underwent EGD.  CT abdomen and pelvis without any acute abnormality.  Aspirin Plavix have been on hold.  Considering patient being contracted, no further endoscopic work-up is planned.  Patient had few other episodes of bloody stools, however improved.  Patient failed swallow evaluation multiple times.  Dobbhoff tube is placed.  Patient's family is meeting with the palliative care on 08/30/2017 for further decide about goals of care.  pt is DNR today. She was intubated on 8/5 and extubated on 8/9.  Overnight she had a bloody bowel movement and her hemglobin stable around 10.  Discussed with family.   Assessment & Plan:   Active Problems:   Polymyositis (HCC)   Adrenal insufficiency (HCC)   Acute blood loss anemia   CVA (cerebral vascular accident) ([place])   Acute upper GI bleed   Nontraumatic hemorrhagic shock ([place])    hemorrhagic shock: Possibly secondary to diverticular vs recurrent colonic AVM . No endoscopic work up .  Resolved. Continues to have episodes of bloody bowel movements now and then.  H&H remains stable.  EGD on 8/15 by Dr Lyndel Safe is negative for source of bleeding.  So far she has received 3 units of prbc transfusion.  CT abd and pelvis on admission negative for acute bleed.  Continue to hold blood thinners.   Acute respiratory failure with hypoxia:  Differential include aspiration pneumonia  due to dysphagia from stroke and pulm edema.  Improved. She was intubated on 8/5 and extubated on 8/9.  Off antibiotics.    Dysphagia: - Has failed numerous swallow studies  - family agreedto have Cortrak placed 8/12/19after 3 failed attempts at NG placement 08/23/17  - tolerating Cortrak and tube feeding well thus far - d/w speech pathologist Ms. Buechet on 08/26/2017, at this time patient's son Evangeline Gula states that they are not ready to make a decision on comfort measures versus PEG tube placement yet they want to continue to feed through Dobbhoff tube /Cortrax for now. -Palliative care has been consulted, plan to continue the speech therapy, if no improvement, if family does not agree for PEG tube consider hospice care -Modified barium swallow-failed.  Recommended n.p.o. -Family meeting is scheduled with the palliative care tomorrow for PEG tube versus hospice -Plan to discharge patient to SNF once nutritional goals are decided.   Leukocytosis: persistent, UA Is abnormal. Urine cultures ordered.    Acute metabolic encephalopathy>  Head CT does not show acute stroke.  Sec to a combination of CVA, aspiration pneumonia and hemorrhagic shock.  Nods her head when asking questions.   H/o CVA both aspirin and plavix held due to bleeding.    Hypertension: better controlled.    H/o polymyositis:  Chronic adrenal insufficiency.  On cell cept and prednisone.    Deconditioning.  Due to CVA AND Polymyositis.  Bed bound with contractures on lower extremities.  Will need SNF on discharge.     DVT prophylaxis:SCD'S Code Status:DNR Family Communication: multiple family members,  spoke with the daughter in detail  Disposition Plan: SNF, when family decides regarding the PEG tube.    Consultants:   Gastroenterology   Palliative care.   pccm slp. Procedures: EGD   Antimicrobials:none.    Subjective: Calm, not oriented and not able to hold a conversations.    Objective: Vitals:   08/29/17 1947 08/29/17 2235 08/30/17 0609 08/30/17 0826  BP: 128/69 (!) 169/93 (!) 149/76 (!) 145/72  Pulse: (!) 102 93 93 70  Resp: 20  18 16   Temp: 98.5 F (36.9 C)  (!) 97.5 F (36.4 C)   TempSrc: Oral  Oral   SpO2: 99%  99% 100%  Weight:   66.2 kg   Height:        Intake/Output Summary (Last 24 hours) at 08/30/2017 1356 Last data filed at 08/30/2017 0900 Gross per 24 hour  Intake 0 ml  Output 2500 ml  Net -2500 ml   Filed Weights   08/26/17 0500 08/27/17 2012 08/30/17 0609  Weight: 65 kg 65.1 kg 66.2 kg    Examination:  General exam: Appears calm , with Cortrak , non verbal.  Respiratory system: diminished at bases, air entry fair.  Cardiovascular system: S1 & S2 heard, RRR. No JVD,  Gastrointestinal system: Abdomen is soft NT ND BS+ Central nervous system: Alert but not oriented.  Extremities: no pedal edema.  Skin: No rashes, lesions or ulcers Psychiatry: not oriented, just nods her head to all questions.  Unable to follow commands.    Data Reviewed: I have personally reviewed following labs and imaging studies  CBC: Recent Labs  Lab 08/26/17 0340 08/27/17 0324 08/27/17 1622 08/27/17 1821 08/28/17 1134 08/29/17 0849 08/30/17 0313  WBC 7.6 11.3*  --   --  13.2* 19.8* 12.4*  HGB 9.5* 9.3* 5.9* 10.1* 10.7* 9.9* 10.4*  HCT 30.0* 29.4* 19.4* 32.3* 34.1* 31.7* 33.0*  MCV 82.6 83.1  --   --  84.6 84.8 84.8  PLT 287 PLATELET CLUMPS NOTED ON SMEAR, UNABLE TO ESTIMATE  --   --  313 310 956   Basic Metabolic Panel: Recent Labs  Lab 08/24/17 0500 08/25/17 0414 08/26/17 0340 08/28/17 1134 08/30/17 0313  NA 142 138 139 140 141  K 2.8* 3.0* 3.8 4.7 3.8  CL 101 100 103 105 104  CO2 28 28 27 27 28   GLUCOSE 78 206* 111* 104* 144*  BUN 8 5* 10 13 20   CREATININE 0.34* 0.47 0.43* 0.43* 0.45  CALCIUM 8.4* 8.2* 8.4* 8.7* 8.6*  MG  --  1.7 2.3 2.1  --   PHOS  --  2.4* 2.3*  --   --    GFR: Estimated Creatinine Clearance: 52.5 mL/min  (by C-G formula based on SCr of 0.45 mg/dL). Liver Function Tests: Recent Labs  Lab 08/25/17 0414 08/26/17 0340  AST 28 29  ALT 25 26  ALKPHOS 42 53  BILITOT 0.8 0.6  PROT 4.6* 5.2*  ALBUMIN 2.5* 2.7*   No results for input(s): LIPASE, AMYLASE in the last 168 hours. No results for input(s): AMMONIA in the last 168 hours. Coagulation Profile: No results for input(s): INR, PROTIME in the last 168 hours. Cardiac Enzymes: No results for input(s): CKTOTAL, CKMB, CKMBINDEX, TROPONINI in the last 168 hours. BNP (last 3 results) No results for input(s): PROBNP in the last 8760 hours. HbA1C: No results for input(s): HGBA1C in the last 72 hours. CBG: Recent Labs  Lab 08/29/17 2128 08/30/17 0007 08/30/17 0356 08/30/17 0832 08/30/17 1159  GLUCAP 123*  176* 124* 113* 124*   Lipid Profile: No results for input(s): CHOL, HDL, LDLCALC, TRIG, CHOLHDL, LDLDIRECT in the last 72 hours. Thyroid Function Tests: No results for input(s): TSH, T4TOTAL, FREET4, T3FREE, THYROIDAB in the last 72 hours. Anemia Panel: No results for input(s): VITAMINB12, FOLATE, FERRITIN, TIBC, IRON, RETICCTPCT in the last 72 hours. Sepsis Labs: No results for input(s): PROCALCITON, LATICACIDVEN in the last 168 hours.  No results found for this or any previous visit (from the past 240 hour(s)).       Radiology Studies: Dg Chest Port 1 View  Result Date: 08/29/2017 CLINICAL DATA:  Leukocytosis.  History of CHF. EXAM: PORTABLE CHEST 1 VIEW COMPARISON:  08/27/2017 FINDINGS: Right-sided injectable port and feeding catheter in stable position. Enlarged cardiac silhouette. Calcific atherosclerotic disease of the aorta. Mediastinal contours appear intact. There is no evidence of pneumothorax. Left lower lobe airspace consolidation and/or left pleural effusion. Osseous structures are without acute abnormality. Soft tissues are grossly normal. IMPRESSION: Left lower lobe airspace consolidation and/or left pleural  effusion, new from patient's prior radiograph. Electronically Signed   By: Fidela Salisbury M.D.   On: 08/29/2017 16:32        Scheduled Meds: . amLODipine  10 mg Oral Daily  . chlorhexidine  15 mL Mouth Rinse BID  . Chlorhexidine Gluconate Cloth  6 each Topical Daily  . feeding supplement (PRO-STAT SUGAR FREE 64)  30 mL Per Tube Daily  . mouth rinse  15 mL Mouth Rinse q12n4p  . metoprolol tartrate  50 mg Per Tube BID  . mycophenolate  1,000 mg Per Tube BID  . pantoprazole sodium  40 mg Per Tube Daily  . potassium chloride  20 mEq Per Tube Daily  . predniSONE  20 mg Per Tube BID  . sodium chloride flush  3 mL Intravenous Q12H   Continuous Infusions: . sodium chloride 10 mL/hr at 08/28/17 2229  . feeding supplement (OSMOLITE 1.2 CAL) 1,000 mL (08/29/17 1903)     LOS: 13 days    Time spent: 60 min    Hosie Poisson, MD [facility] Pager [phone]  If 7PM-7AM, please contact night-coverage [email or web address] Password [email or web address] 08/30/2017, 1:56 PM

## 2017-08-30 NOTE — Progress Notes (Addendum)
Daily Progress Note   Patient Name: [name] [name]       Date: 08/30/2017 DOB: 08-Nov-1940  Age: 77 y.o. MRN#: [id number] Attending Physician: Hosie Poisson, MD Primary Care Physician: [name], MD Admit Date: 08/17/2017  Reason for Consultation/Follow-up: Psychosocial/spiritual support  Subjective: Patient is resting in bed with DHT in place. No signs of distress. No signs of communication. Met with sons, daughters, and patient's sister . Discussed her diagnoses. Comfort care vs continuing aggressive care discussed.  They discuss the shock she suffered from having to have the chest tube, and the shock from multiple attempts at an NGT which would make her afraid of further procedures. They also state that given that shock to their mother, the hospital should be willing to give her more time to improve.   They verbalize frustration that some members of the medical team or optimistic about recovery, and some are not, creating confusion. Extensive conversation between family members regarding what they feel the patient would want, and about the progress she is making.   We discussed her quality of life. The family sees some improvement in her. Family members state they would like to fight for their mother as long as she wants to fight, and feel she is still fighting. They decided collectively to continue current plan.   Regarding the PEG tube, the son and other family members understand that she is weak. They feel that whether or not her swallowing improves, they would want a PEG tube placed when medically safe to do so, hoping for the middle of the week to give her a few more days first. They feel more time will help her get stronger as son states he was told she is too weak for surgery.     Length of Stay: 13  Current Medications: Scheduled Meds:  . amLODipine  10 mg Oral Daily  . chlorhexidine  15 mL Mouth Rinse BID  . Chlorhexidine Gluconate Cloth  6 each Topical Daily  . feeding supplement (PRO-STAT SUGAR FREE 64)  30 mL Per Tube Daily  . mouth rinse  15 mL Mouth Rinse q12n4p  . metoprolol tartrate  50 mg Per Tube BID  . mycophenolate  1,000 mg Per Tube BID  . pantoprazole sodium  40 mg Per Tube Daily  . potassium chloride  20 mEq  Per Tube Daily  . predniSONE  20 mg Per Tube BID  . sodium chloride flush  3 mL Intravenous Q12H    Continuous Infusions: . sodium chloride 10 mL/hr at 08/28/17 2229  . feeding supplement (OSMOLITE 1.2 CAL) 1,000 mL (08/30/17 1615)    PRN Meds: acetaminophen (TYLENOL) oral liquid 160 mg/5 mL, hydrALAZINE, morphine injection, [DISCONTINUED] ondansetron **OR** ondansetron (ZOFRAN) IV, sodium chloride flush  Physical Exam  Constitutional: No distress.  Pulmonary/Chest: Effort normal.  Neurological: She is alert.  Skin: Skin is warm and dry.            Vital Signs: BP (!) 145/72 (BP Location: Right Arm)   Pulse 70   Temp (!) 97.5 F (36.4 C) (Oral)   Resp 16   Ht _0  (1.575 m)   Wt 66.2 kg   SpO2 100%   BMI 26.69 kg/m  SpO2: SpO2: 100 % O2 Device: O2 Device: Room Air O2 Flow Rate: O2 Flow Rate (L/min): 3 L/min  Intake/output summary:   Intake/Output Summary (Last 24 hours) at 08/30/2017 1635 Last data filed at 08/30/2017 0900 Gross per 24 hour  Intake 0 ml  Output 2500 ml  Net -2500 ml   LBM: Last BM Date: 08/30/17 Baseline Weight: Weight: 63.5 kg Most recent weight: Weight: 66.2 kg       Palliative Assessment/Data: Tube feeds      Patient Active Problem List   Diagnosis Date Noted  . Acute upper GI bleed 08/17/2017  . Nontraumatic hemorrhagic shock ([place]) 08/17/2017  . Hyperlipidemia 07/26/2017  . Pressure ulcer of buttock 07/22/2017  . Neck mass 07/21/2017  . Neuropathic pain   . Left middle  cerebral artery stroke ([place]) 06/12/2017  . Neurologic deficit due to acute ischemic stroke ([place])   . Hypokalemia   . CVA (cerebral vascular accident) ([place]) 06/09/2017  . Multiple pulmonary nodules 06/09/2017  . Hypothermia 10/22/2016  . Chronic diastolic CHF (congestive heart failure) ([place]) 10/22/2016  . Abdominal pain, unspecified site 05/17/2013  . Nausea alone 05/17/2013  . 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 Abdominal pain, left lower quadrant 05/28/2011  . Cecal ulcer with hemorrhage 01/10/2011  . Acute blood loss anemia 12/06/2010  . Anemia 12/05/2010  . Adrenal insufficiency ([place]) 12/05/2010  . Osteoporosis 12/05/2010  . LUNG INVOLVEMENT OTHER DISEASES CLASSIFIED ELSW 08/13/2009  . OTHER SPECIFIED DISORDER OF STOMACH AND DUODENUM 07/06/2008  . Essential hypertension 04/26/2007  . ALLERGIC RHINITIS 04/26/2007  . GASTROESOPHAGEAL REFLUX DISEASE 04/26/2007  . Polymyositis ([place]) 04/26/2007  . COLONIC POLYPS, ADENOMATOUS 02/06/2005    Recommendations/Plan:  Family would like PEG tube placed after around 3-5 days to give her a chance to become stronger prior to the procedure.      Code Status:    Code Status Orders  (From admission, onward)         Start     Ordered   08/17/17 1148  Do not attempt resuscitation (DNR)  Continuous    Question Answer Comment  In the event of cardiac or respiratory ARREST Do not call a "code blue"   In the event of cardiac or respiratory ARREST Do not perform Intubation, CPR, defibrillation or ACLS   In the event of cardiac or respiratory ARREST Use medication by any route, position, wound care, and other measures to relive pain and suffering. May use oxygen, suction and manual treatment of airway obstruction as needed for comfort.      08/17/17 1148  Code Status History    Date Active Date Inactive Code Status Order ID Comments User Context   08/17/2017 0829 08/17/2017 1148 DNR [id number]  Nat Christen, MD ED   07/21/2017 1228 07/23/2017 1804 DNR [id number]  Karmen Bongo, MD ED   06/12/2017 1705 06/25/2017 1655 Full Code [id number]  Cathlyn Parsons, PA-C Inpatient   06/12/2017 1705 06/12/2017 1705 Full Code [id number]  Cathlyn Parsons, PA-C Inpatient   06/10/2017 0330 06/12/2017 1657 Full Code [id number]  Bethena Roys, MD Inpatient   10/22/2016 1955 10/24/2016 2009 Full Code [id number]  Vianne Bulls, MD ED       Prognosis:  Poor longterm.   Discharge Planning:  To Be Determined  Care plan was discussed with RN, CCM  Thank you for allowing the Palliative Medicine Team to assist in the care of this patient.   Total Time 12:20-2:40 2 hours 20 min Prolonged Time Billed  yes      Greater than 50%  of this time was spent counseling and coordinating care related to the above assessment and plan.  Asencion Gowda, NP  Please contact Palliative Medicine Team phone at [phone] for questions and concerns.

## 2017-08-31 ENCOUNTER — Encounter (HOSPITAL_COMMUNITY): Payer: Self-pay | Admitting: Family Medicine

## 2017-08-31 DIAGNOSIS — R578 Other shock: Secondary | ICD-10-CM

## 2017-08-31 LAB — GLUCOSE, CAPILLARY
GLUCOSE-CAPILLARY: 103 mg/dL — AB (ref 70–99)
GLUCOSE-CAPILLARY: 129 mg/dL — AB (ref 70–99)
GLUCOSE-CAPILLARY: 155 mg/dL — AB (ref 70–99)
GLUCOSE-CAPILLARY: 157 mg/dL — AB (ref 70–99)
Glucose-Capillary: 78 mg/dL (ref 70–99)
Glucose-Capillary: 111 mg/dL — ABNORMAL HIGH (ref 70–99)

## 2017-08-31 MED ORDER — METOPROLOL TARTRATE 25 MG/10 ML ORAL SUSPENSION
75.0000 mg | Freq: Two times a day (BID) | ORAL | Status: DC
  Administered 2017-08-31 – 2017-09-09 (×16): 75 mg
  Filled 2017-08-31 (×19): qty 30

## 2017-08-31 MED ORDER — METOPROLOL TARTRATE 5 MG/5ML IV SOLN
5.0000 mg | INTRAVENOUS | Status: DC | PRN
  Administered 2017-08-31: 5 mg via INTRAVENOUS
  Filled 2017-08-31: qty 5

## 2017-08-31 NOTE — Progress Notes (Signed)
PROGRESS NOTE    [name] [name]  LHT:[id number] DOB: 03/07/1940 DOA: 08/17/2017 PCP: [name], MD    Brief Narrative:  77yo female SNF resident with a hx of polymyositis, CVA with complete right hemiparesis, contracted, gastric leiomyoma, hypertension, GERD, and diastolic CHF who was brought to the ED w/ bright red blood per rectum. She had episodes of hypotension and unresponsiveness in the ED and required intubation. She was transfused 2 unit of PRBC transfusion. Chest x-ray post intubation showed left pneumothorax. She was extubated on 8/8.  Gastroenterology was consulted, underwent EGD.  CT abdomen and pelvis without any acute abnormality.  Aspirin Plavix have been on hold.  Considering patient being contracted, no further endoscopic work-up is planned.  Patient had few other episodes of bloody stools, however improved.  Patient failed swallow evaluation multiple times.  Dobbhoff tube is placed.  Patient's family is meeting with the palliative care on 08/30/2017 for further decide about goals of care.  pt is DNR.  She was intubated on 8/5 and extubated on 8/9. Overnight she had a bloody bowel movement and her hemglobin stable around 10. Discussed with family.   Assessment & Plan:   Active Problems:   Polymyositis (HCC)   Adrenal insufficiency (H[facility])   Acute blood loss anemia   CVA (cerebral vascular accident) ([place])   Acute upper GI bleed   Nontraumatic hemorrhagic shock ([place])   Hemorrhagic shock: RESOLVED Possibly secondary to diverticular vs recurrent colonic AVM.  No endoscopic work up per GI team.  Resolved. Continues to have intermittent episodes of bloody bowel movements.  H&H remains stable.  EGD on 8/15 by Dr Lyndel Safe is negative for source of bleeding.  So far she has received 3 units of prbc transfusion.  CT abd and pelvis on admission negative for acute bleed.  Continue to hold blood thinners.   Acute respiratory failure with hypoxia:  Differential include aspiration  pneumonia due to dysphagia from stroke and pulm edema.  Improved. She was intubated on 8/5 and extubated on 8/9.  Completed antibiotics.   Dysphagia: - Has failed numerous swallow studies  - family agreedto have Cortrak placed 8/12/19after 3 failed attempts at NG placement 08/23/17  - tolerating Cortrak and tube feeding well thus far - Family met with palliative care team and decided they wanted to pursue PEG placement. -Modified barium swallow-failed.  Recommended n.p.o. -Plan to discharge patient to SNF once PEG placed.  Consult to IR for PEG placement.   Leukocytosis: Pt is on chronic steroids for adrenal insufficiency.   Acute metabolic encephalopathy> resolving Head CT does not show acute stroke.  Sec to a combination of CVA, aspiration pneumonia and hemorrhagic shock.  Nods her head when asking questions.   H/o CVA both aspirin and plavix held due to bleeding.   Hypertension: suboptimally controlled. Increase metoprolol to 75 mg BID. Continue amlodipine 10 mg daily.    H/o polymyositis:  Chronic adrenal insufficiency.  On cell cept and prednisone.   Deconditioning.  Due to CVA AND Polymyositis.  Bed bound with contractures on lower extremities.  Will need SNF on discharge.   DVT prophylaxis:SCDs Code Status:DNR Family Communication: no family present during rounds Disposition Plan: SNF after PEG placed.   Consultants:   Gastroenterology   Palliative care.   PCCM  SLP  Procedures: EGD   Antimicrobials:none.   Subjective: No acute changes, patient remains calm, disoriented and not able to hold conversations.   Objective: Vitals:   08/30/17 2019 08/31/17 5726 08/31/17 0520 08/31/17 2035  BP: (!) 176/85 (!) 182/49 (!) 148/81 139/74  Pulse: (!) 110 96 (!) 135 91  Resp: 18 18    Temp: 98.9 F (37.2 C) 98 F (36.7 C)  98.7 F (37.1 C)  TempSrc: Oral   Oral  SpO2: 98% 99%  98%  Weight:      Height:        Intake/Output Summary (Last 24 hours) at  08/31/2017 0758 Last data filed at 08/31/2017 0659 Gross per 24 hour  Intake 1202.83 ml  Output 1100 ml  Net 102.83 ml   Filed Weights   08/26/17 0500 08/27/17 2012 08/30/17 0609  Weight: 65 kg 65.1 kg 66.2 kg   Examination:  General exam: chronically ill appearing female, awake, Appears calm, with Cortrak , nonverbal. White foam at lips.  Respiratory system: bbs shallow but fairly clear to auscultation.   Cardiovascular system: S1 & S2 heard, RRR. No JVD,  Gastrointestinal system: Abdomen is soft NT ND BS+ Central nervous system: Alert but not oriented.  Extremities: chronic pedal edema.  Skin: No rashes, lesions or ulcers Psychiatry: not oriented, just nods her head to all questions.  Unable to follow commands.   Data Reviewed: I have personally reviewed following labs and imaging studies  CBC: Recent Labs  Lab 08/26/17 0340 08/27/17 0324 08/27/17 1622 08/27/17 1821 08/28/17 1134 08/29/17 0849 08/30/17 0313  WBC 7.6 11.3*  --   --  13.2* 19.8* 12.4*  HGB 9.5* 9.3* 5.9* 10.1* 10.7* 9.9* 10.4*  HCT 30.0* 29.4* 19.4* 32.3* 34.1* 31.7* 33.0*  MCV 82.6 83.1  --   --  84.6 84.8 84.8  PLT 287 PLATELET CLUMPS NOTED ON SMEAR, UNABLE TO ESTIMATE  --   --  [phone]   Basic Metabolic Panel: Recent Labs  Lab 08/25/17 0414 08/26/17 0340 08/28/17 1134 08/30/17 0313  NA 138 139 140 141  K 3.0* 3.8 4.7 3.8  CL 100 103 105 104  CO2 28 27 27 28   GLUCOSE 206* 111* 104* 144*  BUN 5* 10 13 20   CREATININE 0.47 0.43* 0.43* 0.45  CALCIUM 8.2* 8.4* 8.7* 8.6*  MG 1.7 2.3 2.1  --   PHOS 2.4* 2.3*  --   --    GFR: Estimated Creatinine Clearance: 52.5 mL/min (by C-G formula based on SCr of 0.45 mg/dL). Liver Function Tests: Recent Labs  Lab 08/25/17 0414 08/26/17 0340  AST 28 29  ALT 25 26  ALKPHOS 42 53  BILITOT 0.8 0.6  PROT 4.6* 5.2*  ALBUMIN 2.5* 2.7*   No results for input(s): LIPASE, AMYLASE in the last 168 hours. No results for input(s): AMMONIA in the last 168  hours. Coagulation Profile: No results for input(s): INR, PROTIME in the last 168 hours. Cardiac Enzymes: No results for input(s): CKTOTAL, CKMB, CKMBINDEX, TROPONINI in the last 168 hours. BNP (last 3 results) No results for input(s): PROBNP in the last 8760 hours. HbA1C: No results for input(s): HGBA1C in the last 72 hours. CBG: Recent Labs  Lab 08/30/17 1159 08/30/17 1623 08/30/17 2018 08/31/17 0036 08/31/17 0430  GLUCAP 124* 118* 139* 157* 103*   Lipid Profile: No results for input(s): CHOL, HDL, LDLCALC, TRIG, CHOLHDL, LDLDIRECT in the last 72 hours. Thyroid Function Tests: No results for input(s): TSH, T4TOTAL, FREET4, T3FREE, THYROIDAB in the last 72 hours. Anemia Panel: No results for input(s): VITAMINB12, FOLATE, FERRITIN, TIBC, IRON, RETICCTPCT in the last 72 hours. Sepsis Labs: No results for input(s): PROCALCITON, LATICACIDVEN in the last 168 hours.  No results  found for this or any previous visit (from the past 240 hour(s)).   Radiology Studies: Dg Chest Port 1 View  Result Date: 08/29/2017 CLINICAL DATA:  Leukocytosis.  History of CHF. EXAM: PORTABLE CHEST 1 VIEW COMPARISON:  08/27/2017 FINDINGS: Right-sided injectable port and feeding catheter in stable position. Enlarged cardiac silhouette. Calcific atherosclerotic disease of the aorta. Mediastinal contours appear intact. There is no evidence of pneumothorax. Left lower lobe airspace consolidation and/or left pleural effusion. Osseous structures are without acute abnormality. Soft tissues are grossly normal. IMPRESSION: Left lower lobe airspace consolidation and/or left pleural effusion, new from patient's prior radiograph. Electronically Signed   By: Fidela Salisbury M.D.   On: 08/29/2017 16:32   Scheduled Meds: . amLODipine  10 mg Oral Daily  . chlorhexidine  15 mL Mouth Rinse BID  . Chlorhexidine Gluconate Cloth  6 each Topical Daily  . feeding supplement (PRO-STAT SUGAR FREE 64)  30 mL Per Tube Daily  .  mouth rinse  15 mL Mouth Rinse q12n4p  . metoprolol tartrate  50 mg Per Tube BID  . mycophenolate  1,000 mg Per Tube BID  . pantoprazole sodium  40 mg Per Tube Daily  . potassium chloride  20 mEq Per Tube Daily  . predniSONE  20 mg Per Tube BID  . sodium chloride flush  3 mL Intravenous Q12H   Continuous Infusions: . sodium chloride 10 mL/hr at 08/28/17 2229  . feeding supplement (OSMOLITE 1.2 CAL) 1,000 mL (08/30/17 1615)     LOS: 14 days   Time spent: 35 min  Irwin Brakeman, MD [facility] Pager [phone]  If 7PM-7AM, please contact night-coverage [email or web address] Password [facility] 08/31/2017, 7:58 AM

## 2017-08-31 NOTE — Progress Notes (Signed)
Nutrition Follow-up  DOCUMENTATION CODES:   Not applicable  INTERVENTION:   Tube Feeding:  Continue Osmolite 1.2 @ 55 ml/hr Continue Pro-Stat 30 mL daily Provides 1684 kcals, 88 g of protein   NUTRITION DIAGNOSIS:   Inadequate oral intake related to acute illness as evidenced by NPO status.  Being addressed via tube feeding  GOAL:   Patient will meet greater than or equal to 90% of their needs  Met  MONITOR:   TF tolerance, Vent status, Labs, Weight trends  REASON FOR ASSESSMENT:   Ventilator    ASSESSMENT:   76 yo female admitted with acute GI bleed, encephalopathy, pneumothorax s/p chest tube placement. Pt with hx of CVA, polymyositis, gastric leiomyoma, HTN, GERD, CHF. At baseline, pt is total care, wheelchair bound, chronic UE muscle weakness related to polymyositis. Pt is unable to feed self.   Palliative care following, family has decided to pursue G-tube placement SLP continues to follow and recommend NPO status Noted IR consulted for G-tube placement  Osmolite 1.2 @ 55 ml/hr, Pro-Stat 30 mL daily via Cortrak tube  Weight relatively stable  Labs: reviewed Meds:  reviewed   Diet Order:   Diet Order    None      EDUCATION NEEDS:   Not appropriate for education at this time  Skin:  Skin Assessment: Skin Integrity Issues:(no pressure ulcers noted) Skin Integrity Issues:: Other (Comment) Other: MASD: buttock  Last BM:  8/18  Height:   Ht Readings from Last 1 Encounters:  08/17/17 5' 2" (1.575 m)    Weight:   Wt Readings from Last 1 Encounters:  08/30/17 66.2 kg    Ideal Body Weight:     BMI:  Body mass index is 26.69 kg/m.  Estimated Nutritional Needs:   Kcal:  1600-1800 kcals   Protein:  80-90 g  Fluid:  >/= 1.6 L   Cate  MS, RD, LDN, CNSC [phone] Pager  [phone] Weekend/On-Call Pager  

## 2017-08-31 NOTE — Progress Notes (Signed)
  Speech Language Pathology Treatment: Dysphagia  Patient Details Name: [name] CINNAMON MRN: [id number] DOB: 1940/08/23 Today's Date: 08/31/2017 Time: 1420-1430 SLP Time Calculation (min) (ACUTE ONLY): 10 min  Assessment / Plan / Recommendation Clinical Impression  Pt seen at bedside for continued dysphagia treatment and to assess po readiness. Pt noted to be much more vocal today, with intermittent clear/wet voice quality.aising concern for poor secretion management. Oral care was completed with suction, with thick white secretions suctioned from the back of pt oral cavity/throat. Pt was given a single ice chip following oral care. No oral manipulation or anterior leakage was noted. No laryngeal elevation was observed or appreciated to palpation. No further ice chips were presented at this time, given high aspiration risk. No family was present at this time. SLP will continue to follow acutely.    HPI HPI: Pt is a 54yoF with hx Polymyositis, CVA, Gastric leiomyoma, HTN, GERD, and dCHF, who presented to ER with BRBPR, hypotension, and AMS. Difficult intubation 8/5, extubated 8/8. EGD showed no signs of bleeding. CXR post-intubation showed L PTX. CT Head negative. BSE 06/13/17 recommended Dys 3 diet and thin liquids, advanced to regular textures prior to d/c. MBS in May 2019 showed an overall functional swallow with a single incident of flash penetration. Pt was noted to cough during MBS but not in the setting of airway compromise. There was also concern for a prominent CP and a possible Zencker's diverticulum developing, but no backflow was observed. Dys 3 diet and thin liquids were recommended at that time.      SLP Plan  Continue with current plan of care       Recommendations  Diet recommendations: NPO Medication Administration: Via alternative means                Oral Care Recommendations: Oral care QID Follow up Recommendations: Skilled Nursing facility;24 hour  supervision/assistance SLP Visit Diagnosis: Dysphagia, oropharyngeal phase (R13.12) Plan: Continue with current plan of care       GO              Celia B. [facility], CCC-SLP Speech Language Pathologist [phone]  Shonna Chock 08/31/2017, 2:31 PM

## 2017-08-31 NOTE — Progress Notes (Addendum)
Daily Progress Note   Patient Name: [name] [name]       Date: 08/31/2017 DOB: [birth year]  Age: 77 y.o. MRN#: [id number] Attending Physician: Murlean Iba, MD Primary Care Physician: [name], MD Admit Date: 08/17/2017  Reason for Consultation/Follow-up: Psychosocial/spiritual support  Subjective:  Patient resting in bed. Non verbal. DHT in place. No signs of distress. Consult placed for G tube palcement with IR per primary team. Spoke with daughter and updated her on current status and plans. Daughter had no questions.    Recommend palliative at D/C.      Length of Stay: 14  Current Medications: Scheduled Meds:  . amLODipine  10 mg Oral Daily  . chlorhexidine  15 mL Mouth Rinse BID  . Chlorhexidine Gluconate Cloth  6 each Topical Daily  . feeding supplement (PRO-STAT SUGAR FREE 64)  30 mL Per Tube Daily  . mouth rinse  15 mL Mouth Rinse q12n4p  . metoprolol tartrate  75 mg Per Tube BID  . mycophenolate  1,000 mg Per Tube BID  . pantoprazole sodium  40 mg Per Tube Daily  . potassium chloride  20 mEq Per Tube Daily  . predniSONE  20 mg Per Tube BID  . sodium chloride flush  3 mL Intravenous Q12H    Continuous Infusions: . sodium chloride 10 mL/hr at 08/28/17 2229  . feeding supplement (OSMOLITE 1.2 CAL) 1,000 mL (08/30/17 1615)    PRN Meds: acetaminophen (TYLENOL) oral liquid 160 mg/5 mL, hydrALAZINE, metoprolol tartrate, morphine injection, [DISCONTINUED] ondansetron **OR** ondansetron (ZOFRAN) IV, sodium chloride flush  Physical Exam  Constitutional: No distress.  Pulmonary/Chest: Effort normal.  Neurological: She is alert.  Skin: Skin is warm and dry.            Vital Signs: BP 139/74 (BP Location: Left Arm)   Pulse 91   Temp 98.7 F (37.1 C) (Oral)    Resp 18   Ht 5\' 2"  (1.575 m)   Wt 66.2 kg   SpO2 98%   BMI 26.69 kg/m  SpO2: SpO2: 98 % O2 Device: O2 Device: Room Air O2 Flow Rate: O2 Flow Rate (L/min): 3 L/min  Intake/output summary:   Intake/Output Summary (Last 24 hours) at 08/31/2017 1139 Last data filed at 08/31/2017 0659 Gross per 24 hour  Intake 1202.83 ml  Output 1100  ml  Net 102.83 ml   LBM: Last BM Date: 08/30/17 Baseline Weight: Weight: 63.5 kg Most recent weight: Weight: 66.2 kg       Palliative Assessment/Data: Tube feeds      Patient Active Problem List   Diagnosis Date Noted  . Acute upper GI bleed 08/17/2017  . Nontraumatic hemorrhagic shock ([place]) 08/17/2017  . Hyperlipidemia 07/26/2017  . Pressure ulcer of buttock 07/22/2017  . Neck mass 07/21/2017  . Neuropathic pain   . 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 Neurologic deficit due to acute ischemic stroke ([place])   . Hypokalemia   . CVA (cerebral vascular accident) ([place]) 06/09/2017  . Multiple pulmonary nodules 06/09/2017  . Hypothermia 10/22/2016  . Chronic diastolic CHF (congestive heart failure) ([place]) 10/22/2016  . Abdominal pain, unspecified site 05/17/2013  . Nausea alone 05/17/2013  . 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 Abdominal pain, left lower quadrant 05/28/2011  . Cecal ulcer with hemorrhage 01/10/2011  . Acute blood loss anemia 12/06/2010  . Anemia 12/05/2010  . Adrenal insufficiency ([place]) 12/05/2010  . Osteoporosis 12/05/2010  . LUNG INVOLVEMENT OTHER DISEASES CLASSIFIED ELSW 08/13/2009  . OTHER SPECIFIED DISORDER OF STOMACH AND DUODENUM 07/06/2008  . Essential hypertension 04/26/2007  . ALLERGIC RHINITIS 04/26/2007  . GASTROESOPHAGEAL REFLUX DISEASE 04/26/2007  . Polymyositis ([place]) 04/26/2007  . COLONIC POLYPS, ADENOMATOUS 02/06/2005    Recommendations/Plan:  Family would like PEG tube placed after around 3-5 days to give her a chance to become stronger prior to the  procedure.   Consult placed for G tube with IR by primary team.     Code Status:    Code Status Orders  (From admission, onward)         Start     Ordered   08/17/17 1148  Do not attempt resuscitation (DNR)  Continuous    Question Answer Comment  In the event of cardiac or respiratory ARREST Do not call a "code blue"   In the event of cardiac or respiratory ARREST Do not perform Intubation, CPR, defibrillation or ACLS   In the event of cardiac or respiratory ARREST Use medication by any route, position, wound care, and other measures to relive pain and suffering. May use oxygen, suction and manual treatment of airway obstruction as needed for comfort.      08/17/17 1148        Code Status History    Date Active Date Inactive Code Status Order ID Comments User Context   08/17/2017 0829 08/17/2017 1148 DNR [id number]  Nat Christen, MD ED   07/21/2017 1228 07/23/2017 1804 DNR [id number]  Karmen Bongo, MD ED   06/12/2017 1705 06/25/2017 1655 Full Code [id number]  Cathlyn Parsons, PA-C Inpatient   06/12/2017 1705 06/12/2017 1705 Full Code [id number]  Cathlyn Parsons, PA-C Inpatient   06/10/2017 0330 06/12/2017 1657 Full Code [id number]  Bethena Roys, MD Inpatient   10/22/2016 1955 10/24/2016 2009 Full Code [id number]  Vianne Bulls, MD ED       Prognosis:  Poor longterm.   Discharge Planning:  To Be Determined   Thank you for allowing the Palliative Medicine Team to assist in the care of this patient.   Total Time 15 min Prolonged Time Billed  no      Greater than 50%  of this time was spent counseling and coordinating care related to the above assessment and plan.  Asencion Gowda, NP  Please contact Palliative Medicine Team phone at  [phone] for questions and concerns.

## 2017-09-01 ENCOUNTER — Inpatient Hospital Stay (HOSPITAL_COMMUNITY): Payer: Medicare Other

## 2017-09-01 ENCOUNTER — Encounter: Payer: Self-pay | Admitting: Physician Assistant

## 2017-09-01 LAB — CBC
HEMATOCRIT: 33.4 % — AB (ref 36.0–46.0)
HEMOGLOBIN: 10.3 g/dL — AB (ref 12.0–15.0)
MCH: 26.7 pg (ref 26.0–34.0)
MCHC: 30.8 g/dL (ref 30.0–36.0)
MCV: 86.5 fL (ref 78.0–100.0)
Platelets: 335 10*3/uL (ref 150–400)
RBC: 3.86 MIL/uL — ABNORMAL LOW (ref 3.87–5.11)
RDW: 18.7 % — AB (ref 11.5–15.5)
WBC: 17.2 10*3/uL — AB (ref 4.0–10.5)

## 2017-09-01 LAB — BASIC METABOLIC PANEL
ANION GAP: 15 (ref 5–15)
BUN: 23 mg/dL (ref 8–23)
CALCIUM: 8.9 mg/dL (ref 8.9–10.3)
CHLORIDE: 104 mmol/L (ref 98–111)
CO2: 26 mmol/L (ref 22–32)
Creatinine, Ser: 0.54 mg/dL (ref 0.44–1.00)
GFR calc non Af Amer: >60 mL/min (ref >60)
Glucose, Bld: 139 mg/dL — ABNORMAL HIGH (ref 70–99)
Potassium: 3.4 mmol/L — ABNORMAL LOW (ref 3.5–5.1)
Sodium: 145 mmol/L (ref 135–145)

## 2017-09-01 LAB — GLUCOSE, CAPILLARY
GLUCOSE-CAPILLARY: 114 mg/dL — AB (ref 70–99)
GLUCOSE-CAPILLARY: 83 mg/dL (ref 70–99)
Glucose-Capillary: 104 mg/dL — ABNORMAL HIGH (ref 70–99)
Glucose-Capillary: 107 mg/dL — ABNORMAL HIGH (ref 70–99)
Glucose-Capillary: 111 mg/dL — ABNORMAL HIGH (ref 70–99)
Glucose-Capillary: 94 mg/dL (ref 70–99)

## 2017-09-01 MED ORDER — CEFAZOLIN SODIUM-DEXTROSE 2-4 GM/100ML-% IV SOLN
2.0000 g | Freq: Once | INTRAVENOUS | Status: AC
  Administered 2017-09-02: 2 g via INTRAVENOUS
  Filled 2017-09-01: qty 100

## 2017-09-01 MED ORDER — LIDOCAINE VISCOUS HCL 2 % MT SOLN
OROMUCOSAL | Status: AC
  Filled 2017-09-01: qty 15

## 2017-09-01 MED ORDER — SODIUM BICARBONATE 650 MG PO TABS
650.0000 mg | ORAL_TABLET | Freq: Once | ORAL | Status: AC
  Administered 2017-09-01: 650 mg via ORAL
  Filled 2017-09-01: qty 1

## 2017-09-01 MED ORDER — PANCRELIPASE (LIP-PROT-AMYL) 12000-38000 UNITS PO CPEP
24000.0000 [IU] | ORAL_CAPSULE | Freq: Once | ORAL | Status: AC
  Administered 2017-09-01: 24000 [IU] via ORAL
  Filled 2017-09-01: qty 2

## 2017-09-01 MED ORDER — SODIUM BICARBONATE 650 MG PO TABS
650.0000 mg | ORAL_TABLET | Freq: Once | ORAL | Status: AC
  Administered 2017-09-01: 650 mg
  Filled 2017-09-01: qty 1

## 2017-09-01 MED ORDER — PANCRELIPASE (LIP-PROT-AMYL) 10440-39150 UNITS PO TABS
20880.0000 [IU] | ORAL_TABLET | Freq: Once | ORAL | Status: AC
  Administered 2017-09-01: 20880 [IU]
  Filled 2017-09-01 (×2): qty 2

## 2017-09-01 MED ORDER — POTASSIUM CHLORIDE 20 MEQ/15ML (10%) PO SOLN
30.0000 meq | Freq: Every day | ORAL | Status: DC
  Administered 2017-09-03 – 2017-09-08 (×6): 30 meq
  Filled 2017-09-01 (×8): qty 30

## 2017-09-01 NOTE — Progress Notes (Signed)
PROGRESS NOTE    TACI STERLING  MOQ:[id number] DOB: 1940/12/16 DOA: 08/17/2017 PCP: [name], MD    Brief Narrative:  77yo female SNF resident with a hx of polymyositis, CVA with complete right hemiparesis, contracted, gastric leiomyoma, hypertension, GERD, and diastolic CHF who was brought to the ED w/ bright red blood per rectum. She had episodes of hypotension and unresponsiveness in the ED and required intubation. She was transfused 2 unit of PRBC transfusion. Chest x-ray post intubation showed left pneumothorax. She was extubated on 8/8.  Gastroenterology was consulted, underwent EGD.  CT abdomen and pelvis without any acute abnormality.  Aspirin Plavix have been on hold.  Considering patient being contracted, no further endoscopic work-up is planned.  Patient had few other episodes of bloody stools, however improved.  Patient failed swallow evaluation multiple times.  Dobbhoff tube is placed.  Patient's family is meeting with the palliative care on 08/30/2017 for further decide about goals of care.  pt is DNR.  She was intubated on 8/5 and extubated on 8/9. Overnight she had a bloody bowel movement and her hemglobin stable around 10. Discussed with family.   Assessment & Plan:   Active Problems:   Polymyositis (HCC)   Adrenal insufficiency (HCC)   Acute blood loss anemia   CVA (cerebral vascular accident) ([place])   Acute upper GI bleed   Nontraumatic hemorrhagic shock ([place])   Hemorrhagic shock: RESOLVED Possibly secondary to diverticular vs recurrent colonic AVM.  No endoscopic work up per GI team.  Resolved. Continues to have intermittent episodes of bloody bowel movements.  H&H remains stable.  EGD on 8/15 by Dr Lyndel Safe is negative for source of bleeding.  So far she has received 3 units of prbc transfusion.  CT abd and pelvis on admission negative for acute bleed.  Continue to hold blood thinners.   Acute respiratory failure with hypoxia:  Differential include aspiration  pneumonia due to dysphagia from stroke and pulm edema.  Improved. She was intubated on 8/5 and extubated on 8/9.  Completed antibiotics.   Dysphagia: - Has failed numerous swallow studies  - family agreedto have Cortrak placed 8/12/19after 3 failed attempts at NG placement 08/23/17  - tolerating Cortrak and tube feeding well thus far - Family met with palliative care team and decided they wanted to pursue PEG placement. -Modified barium swallow-failed.  Recommended n.p.o. -Plan to discharge patient to SNF once PEG placed.  Consult to IR for PEG placement.  -Unfortunately her core tract tube has become clogged and we have been unable to have it unclogged.  I spoke with IR and they are not able to see the patient today due to very pack schedule.  I spoke with fluoroscopy radiology and they are also packed but will try to see her today to see if they can unclog the core track and if not replace it with a new one.  I have updated her family members at the bedside about this and they verbalized understanding.  I tried to contact the [place] team but they were not available and no contact information could be found today.    Leukocytosis: Pt is on chronic steroids for adrenal insufficiency.   Acute metabolic encephalopathy> resolving Head CT does not show acute stroke.  Sec to a combination of CVA, aspiration pneumonia and hemorrhagic shock.  Nods her head when asking questions.   H/o CVA both aspirin and plavix held due to bleeding.   Hypertension: suboptimally controlled. Increase metoprolol to 75 mg BID. Continue  amlodipine 10 mg daily.    H/o polymyositis:  Chronic adrenal insufficiency.  On cell cept and prednisone.   Deconditioning.  Due to CVA AND Polymyositis.  Bed bound with contractures on lower extremities.  Will need SNF on discharge.   Vascular dementia -the patient's family is very concerned about dementia.  They had an appointment to establish with [facility] however she missed the appointment because of being hospitalized.  She will need to follow-up with [facility] for further evaluation.  She was scheduled to see Venancio Poisson NP.    DVT prophylaxis:SCDs Code Status:DNR Family Communication: I had a long conversation with her son and daughter at the bedside today and tried to explain her condition and prognosis and high risk for aspiration even if she does have the PEG placed.  They verbalized understanding. Disposition Plan: SNF after PEG placed.   Consultants:   Gastroenterology   Palliative care.   PCCM  SLP  Procedures: EGD   Antimicrobials:none.   Subjective: Patient recognizes children and could follow some commands, blinks to communicate but seems appropriate.  Objective: Vitals:   08/31/17 1833 08/31/17 2100 09/01/17 0433 09/01/17 0746  BP: 132/66 (!) 154/66 127/71 140/73  Pulse: (!) 106 (!) 111 96 86  Resp:  (!) _0 Temp: 99 F (37.2 C) 99.4 F (37.4 C) 98.3 F (36.8 C) 98.2 F (36.8 C)  TempSrc: Oral Oral  Oral  SpO2: 98% 95% 100% 100%  Weight:      Height:        Intake/Output Summary (Last 24 hours) at 09/01/2017 0815 Last data filed at 08/31/2017 2201 Gross per 24 hour  Intake 190.33 ml  Output 750 ml  Net -559.67 ml   Filed Weights   08/26/17 0500 08/27/17 2012 08/30/17 0609  Weight: 65 kg 65.1 kg 66.2 kg   Examination:  General exam: chronically ill appearing female, awake, Appears calm, with Cortrak , nonverbal. White foam at lips and throat.  Respiratory system: bbs shallow but fairly clear to auscultation.   Cardiovascular system: S1 & S2 heard, RRR. No JVD,  Gastrointestinal system: Abdomen is soft NT ND BS+ Central nervous system: Alert but not oriented.  Extremities: chronic pedal edema.  Skin: No rashes, lesions or ulcers Psychiatry: blinks and nods to questions.   Data Reviewed: I have personally reviewed following labs and imaging  studies  CBC: Recent Labs  Lab 08/27/17 0324  08/27/17 1821 08/28/17 1134 08/29/17 0849 08/30/17 0313 09/01/17 0323  WBC 11.3*  --   --  13.2* 19.8* 12.4* 17.2*  HGB 9.3*   < > 10.1* 10.7* 9.9* 10.4* 10.3*  HCT 29.4*   < > 32.3* 34.1* 31.7* 33.0* 33.4*  MCV 83.1  --   --  84.6 84.8 84.8 86.5  PLT PLATELET CLUMPS NOTED ON SMEAR, UNABLE TO ESTIMATE  --   --  313 310 333 335   < > = values in this interval not displayed.   Basic Metabolic Panel: Recent Labs  Lab 08/26/17 0340 08/28/17 1134 08/30/17 0313 09/01/17 0323  NA 139 140 141 145  K 3.8 4.7 3.8 3.4*  CL 103 105 104 104  CO2 _1 GLUCOSE 111* 104* 144* 139*  BUN _2 CREATININE 0.43* 0.43* 0.45 0.54  CALCIUM 8.4* 8.7* 8.6* 8.9  MG 2.3 2.1  --   --   PHOS 2.3*  --   --   --  GFR: Estimated Creatinine Clearance: 52.5 mL/min (by C-G formula based on SCr of 0.54 mg/dL). Liver Function Tests: Recent Labs  Lab 08/26/17 0340  AST 29  ALT 26  ALKPHOS 53  BILITOT 0.6  PROT 5.2*  ALBUMIN 2.7*   No results for input(s): LIPASE, AMYLASE in the last 168 hours. No results for input(s): AMMONIA in the last 168 hours. Coagulation Profile: No results for input(s): INR, PROTIME in the last 168 hours. Cardiac Enzymes: No results for input(s): CKTOTAL, CKMB, CKMBINDEX, TROPONINI in the last 168 hours. BNP (last 3 results) No results for input(s): PROBNP in the last 8760 hours. HbA1C: No results for input(s): HGBA1C in the last 72 hours. CBG: Recent Labs  Lab 08/31/17 1627 08/31/17 1926 09/01/17 0015 09/01/17 0432 09/01/17 0746  GLUCAP 155* 129* 107* 114* 83   Lipid Profile: No results for input(s): CHOL, HDL, LDLCALC, TRIG, CHOLHDL, LDLDIRECT in the last 72 hours. Thyroid Function Tests: No results for input(s): TSH, T4TOTAL, FREET4, T3FREE, THYROIDAB in the last 72 hours. Anemia Panel: No results for input(s): VITAMINB12, FOLATE, FERRITIN, TIBC, IRON, RETICCTPCT in the last 72 hours. Sepsis  Labs: No results for input(s): PROCALCITON, LATICACIDVEN in the last 168 hours.  No results found for this or any previous visit (from the past 240 hour(s)).   Radiology Studies: No results found. Scheduled Meds: . amLODipine  10 mg Oral Daily  . chlorhexidine  15 mL Mouth Rinse BID  . Chlorhexidine Gluconate Cloth  6 each Topical Daily  . feeding supplement (PRO-STAT SUGAR FREE 64)  30 mL Per Tube Daily  . mouth rinse  15 mL Mouth Rinse q12n4p  . metoprolol tartrate  75 mg Per Tube BID  . mycophenolate  1,000 mg Per Tube BID  . pantoprazole sodium  40 mg Per Tube Daily  . potassium chloride  30 mEq Per Tube Daily  . predniSONE  20 mg Per Tube BID  . sodium chloride flush  3 mL Intravenous Q12H   Continuous Infusions: . sodium chloride 10 mL/hr at 08/28/17 2229  . feeding supplement (OSMOLITE 1.2 CAL) 1,000 mL (08/31/17 1729)    LOS: 15 days   Time spent: 87 min  Irwin Brakeman, MD [facility] Pager [phone]  If 7PM-7AM, please contact night-coverage [email or web address] Password [email or web address] 09/01/2017, 8:15 AM

## 2017-09-01 NOTE — Progress Notes (Signed)
Physical Therapy Treatment Patient Details Name: [name] [name] MRN: [id number] DOB: [birth year] Today's Date: 09/01/2017    History of Present Illness 77 y.o. female admitted on 08/17/17 for rectal bleeding.  Pt dx with GIB with hematochezia (s/p EGD with no signs of UGIB s/p 2 units PRBCs), syncope related to possible hemorrhagic shock vs PTX vs sepsis requiring intubation 08/17/17-08/20/17.  PTX treated with L chest tube 8/5-08/21/17.  Pt with other significant PMH of polymyositis, CVA, chronic diastolic heart failure, bil RTC repair, and lumbar laminectomy.    PT Comments    Continuing work on functional mobility and activity tolerance;  Sat EOB without physical assist, supervision for safety; Able to transfer via squat pivot to recliner with +2 assist and knees blocked for safety; Positioned comfortably in the chair  Follow Up Recommendations  SNF     Equipment Recommendations  Hospital bed;Wheelchair cushion (measurements PT);Wheelchair (measurements PT);Other (comment)(hoyer lift)    Recommendations for Other Services       Precautions / Restrictions Precautions Precautions: Fall Precaution Comments: right side weaker than left    Mobility  Bed Mobility Overal bed mobility: Needs Assistance Bed Mobility: Rolling;Sidelying to Sit Rolling: Max assist Sidelying to sit: Max assist;+2 for physical assistance       General bed mobility comments: Max assist for all aspects of bed mobility; minimal initiation  Transfers Overall transfer level: Needs assistance Equipment used: 2 person hand held assist(bilateral support at gait belt) Transfers: Squat Pivot Transfers     Squat pivot transfers: Max assist;+2 physical assistance     General transfer comment: Squat pivot transfer bed to recliner with +2 assist; knees blocked for stability  Ambulation/Gait                 Stairs             Wheelchair Mobility    Modified Rankin (Stroke Patients Only)        Balance     Sitting balance-Leahy Scale: Fair Sitting balance - Comments: Sat EOB with supervision for 5-8 minutes; Seemed pleased to be sitting up; no loss of balance                                    Cognition Arousal/Alertness: Awake/alert Behavior During Therapy: Flat affect Overall Cognitive Status: Impaired/Different from baseline Area of Impairment: Following commands;Attention;Problem solving                       Following Commands: Follows one step commands inconsistently     Problem Solving: Slow processing;Decreased initiation;Difficulty sequencing;Requires tactile cues;Requires verbal cues General Comments: Turns head to respond to her name; briefly makes eye contact      Exercises      General Comments        Pertinent Vitals/Pain Pain Assessment: Faces Faces Pain Scale: No hurt Pain Intervention(s): Monitored during session    Home Living                      Prior Function            PT Goals (current goals can now be found in the care plan section) Acute Rehab PT Goals Patient Stated Goal: Unable to state PT Goal Formulation: Patient unable to participate in goal setting Time For Goal Achievement: 09/04/17 Potential to Achieve Goals: Fair Progress towards PT goals: Progressing toward goals  Frequency    Min 2X/week      PT Plan Current plan remains appropriate    Co-evaluation              AM-PAC PT "6 Clicks" Daily Activity  Outcome Measure  Difficulty turning over in bed (including adjusting bedclothes, sheets and blankets)?: Unable Difficulty moving from lying on back to sitting on the side of the bed? : Unable Difficulty sitting down on and standing up from a chair with arms (e.g., wheelchair, bedside commode, etc,.)?: Unable Help needed moving to and from a bed to chair (including a wheelchair)?: A Lot Help needed walking in hospital room?: Total Help needed climbing 3-5 steps with a  railing? : Total 6 Click Score: 7    End of Session Equipment Utilized During Treatment: Gait belt Activity Tolerance: Patient tolerated treatment well Patient left: in chair;with call bell/phone within reach;with chair alarm set Nurse Communication: Mobility status;Need for lift equipment PT Visit Diagnosis: Muscle weakness (generalized) (M62.81);Difficulty in walking, not elsewhere classified (R26.2);Other symptoms and signs involving the nervous system (R29.898)     Time: 4034-7425 PT Time Calculation (min) (ACUTE ONLY): 27 min  Charges:  $Therapeutic Activity: 23-37 mins                     Roney Marion, PT  Acute Rehabilitation Services Pager [phone] Office [place] 09/01/2017, 4:24 PM

## 2017-09-01 NOTE — Consult Note (Signed)
Chief Complaint: Patient was seen in consultation today for percutaneous gastrostomy tube placement  Referring Physician(s): Dr. Irwin Brakeman  Supervising Physician: Aletta Edouard  Patient Status: [facility] - In-pt  History of Present Illness: [name] [name] is a 77 y.o. female with a past medical history of CVA, polymyositis, CHF, CAD, HLD, gastric ulcers, GIB and GERD presented to [facility] ED on 8/5 from [facility] with complaints of rectal bleeding and AMS. She became unresponsive several times in ED - code stroke was called; CT head and CT angio were negative for acute processes. It was thought these episodes were vasovagal in nature. She was admitted for further workup of frank red blood per rectum.   She required intubation on 8/5 due to inability to protect her airway, left-sided pneumothorax was noted on post procedure CXR which required a pigtail chest tube. She also required 2 units PRBC due to 3 point drop in hemoglobin on the same day. EGD was performed on 8/5 and no signs of bleeding were noted. CTA abdomen/pelvis performed on 8/5 which did not show any evidence of GI bleeding. She was extubated on 8/8, however she has failed several swallow studies and as such Cortrak was placed on 8/12 after 3 failed NG attempts on 8/11. Cotrak tube clogged today - unable to pass medications; request to unclog made to Cotrak team.  Request for gastrostomy tube placement in IR - family initially wanted to hold off on gastrostomy tube until their mother was stronger and they had come to a decision regarding level of care. Family met with palliative team regarding goals of care and per note they wish to continue aggressive care, patient to remain DNR.  Patient nonverbal on exam today, opens eyes and moans "no" periodically, somewhat combative when attempting to examine abdomen. Spoke to daughter, [name] [name], on phone today regarding g-tube - she has some concerns that she was told it was not safe for her  mother to have g-tube placed right now and that they had to wait. She does not remember who told her this - she is amenable to g-tube placement in IR as long as her mother is stable enough. She also requests that g-tube not be placed until tomorrow morning if possible.     Past Medical History:  Diagnosis Date  . Allergic rhinitis, cause unspecified   . Benign neoplasm of colon   . Chronic diastolic CHF (congestive heart failure) ([place])   . CVA (cerebral vascular accident) ([place]) 05/2017  . Esophageal reflux   . Excessive daytime sleepiness   . 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 GI hemorrhage 2011   recurrent  . Hyperlipidemia   . Osteoporosis   . Polymyositis ([place])   . Unspecified essential hypertension   . Vitamin D deficiency     Past Surgical History:  Procedure Laterality Date  . ABDOMINAL HYSTERECTOMY     partial  . BACK SURGERY    . COLONOSCOPY  01/09/2011   others also  . COLONOSCOPY  05/29/2011   Procedure: COLONOSCOPY;  Surgeon: Gatha Mayer, MD;  Location: [facility] ENDOSCOPY;  Service: Endoscopy;  Laterality: N/A;  Greggory Brandy Carlean Purl  . COLONOSCOPY WITH PROPOFOL N/A 02/16/2014   Procedure: COLONOSCOPY WITH PROPOFOL;  Surgeon: Milus Banister, MD;  Location: [facility] ENDOSCOPY;  Service: Endoscopy;  Laterality: N/A;  . ESOPHAGOGASTRODUODENOSCOPY  12/08/2010   others also  . ESOPHAGOGASTRODUODENOSCOPY N/A 08/17/2017   Procedure: ESOPHAGOGASTRODUODENOSCOPY (EGD);  Surgeon: Jackquline Denmark, MD;  Location: [facility] ENDOSCOPY;  Service:  Endoscopy;  Laterality: N/A;  . ESOPHAGOGASTRODUODENOSCOPY (EGD) WITH PROPOFOL N/A 02/16/2014   Procedure: ESOPHAGOGASTRODUODENOSCOPY (EGD) WITH PROPOFOL;  Surgeon: Milus Banister, MD;  Location: [facility] ENDOSCOPY;  Service: Endoscopy;  Laterality: N/A;  . EUS    . HOT HEMOSTASIS  05/29/2011   Procedure: HOT HEMOSTASIS (ARGON PLASMA COAGULATION/BICAP);  Surgeon: Gatha Mayer, MD;  Location: [facility];  Service: Endoscopy;  Laterality: N/A;  . LUMBAR LAMINECTOMY     . ROTATOR CUFF REPAIR Bilateral   . TONSILLECTOMY  age 17    Allergies: Codeine; Hydrocodone; and Tramadol hcl  Medications: Prior to Admission medications   Medication Sig Start Date End Date Taking? Authorizing Provider  acetaminophen (TYLENOL 8 HOUR) 650 MG CR tablet Take 1 tablet (650 mg total) by mouth every 8 (eight) hours as needed. Patient taking differently: Take 650 mg by mouth every 8 (eight) hours as needed for pain.  05/15/17  Yes Varney Biles, MD  allopurinol (ZYLOPRIM) 100 MG tablet Take 1 tablet (100 mg total) by mouth daily. 07/23/17 08/22/17 Yes Florencia Reasons, MD  aspirin 81 MG chewable tablet Chew 1 tablet (81 mg total) by mouth daily. 06/12/17  Yes Caren Griffins, MD  atenolol (TENORMIN) 50 MG tablet Take 50 mg by mouth daily.     Yes [provider]  Calcium Carbonate (CALCIUM 600 PO) Take 1,200 mg by mouth daily.   Yes [provider]  clopidogrel (PLAVIX) 75 MG tablet Take 75 mg by mouth daily.   Yes [provider]  ferrous sulfate 325 (65 FE) MG tablet Take 325 mg by mouth daily with breakfast.   Yes [provider]  fluticasone (FLONASE) 50 MCG/ACT nasal spray Place 1 spray into both nostrils daily as needed for allergies.  06/05/17  Yes [provider]  folic acid (FOLVITE) 119 MCG tablet Take 400 mcg by mouth daily.     Yes [provider]  furosemide (LASIX) 40 MG tablet Take 1 tablet (40 mg total) by mouth every Monday, Wednesday, and Friday. 07/24/17  Yes Florencia Reasons, MD  gabapentin (NEURONTIN) 100 MG capsule TAKE ONE CAPSULE BY MOUTH TWICE DAILY 04/14/17  Yes Regal, Tamala Fothergill, DPM  mycophenolate (CELLCEPT) 250 MG capsule Take 4 capsules (1,000 mg total) by mouth 2 (two) times daily. 07/23/17  Yes Florencia Reasons, MD  omega-3 acid ethyl esters (LOVAZA) 1 g capsule Take 1 capsule (1 g total) by mouth 2 (two) times daily. 07/23/17  Yes Florencia Reasons, MD  oxyCODONE (OXY IR/ROXICODONE) 5 MG immediate release tablet Take 5 mg by mouth  2 (two) times daily as needed for severe pain.   Yes [provider]  pantoprazole (PROTONIX) 40 MG tablet Take 1 tablet (40 mg total) by mouth daily. Patient taking differently: Take 40 mg by mouth daily. Do not crush 06/25/17  Yes Love, Ivan Anchors, PA-C  potassium chloride SA (K-DUR,KLOR-CON) 20 MEQ tablet Take 2 tablets (40 mEq total) by mouth daily. 06/25/17  Yes Love, Ivan Anchors, PA-C  senna-docusate (SENOKOT-S) 8.6-50 MG tablet Take 2 tablets by mouth at bedtime as needed for mild constipation. 06/24/17  Yes Love, Ivan Anchors, PA-C  vitamin B-12 (CYANOCOBALAMIN) 1000 MCG tablet Take 1,000 mcg by mouth daily.     Yes [provider]  vitamin C (ASCORBIC ACID) 500 MG tablet Take 1,000 mg by mouth daily.    Yes [provider]     Family History  Problem Relation Age of Onset  . Dementia Mother   . Hypertension  Father   . Malignant hyperthermia Father   . Colon cancer Neg Hx     Social History   Socioeconomic History  . Marital status: Divorced    Spouse name: Not on file  . Number of children: 5  . Years of education: 11th grade  . Highest education level: Not on file  Occupational History  . Occupation: retired   Scientific laboratory technician  . Financial resource strain: Not on file  . Food insecurity:    Worry: Not on file    Inability: Not on file  . Transportation needs:    Medical: Not on file    Non-medical: Not on file  Tobacco Use  . Smoking status: Never Smoker  . Smokeless tobacco: Former Systems developer    Types: Snuff  . Tobacco comment: 07/21/2017 "no snuff  since ~ 04/2017"  Substance and Sexual Activity  . Alcohol use: No  . Drug use: No  . Sexual activity: Not Currently  Lifestyle  . Physical activity:    Days per week: Not on file    Minutes per session: Not on file  . Stress: Not on file  Relationships  . Social connections:    Talks on phone: Not on file    Gets together: Not on file    Attends religious service: Not on file    Active member of club or  organization: Not on file    Attends meetings of clubs or organizations: Not on file    Relationship status: Not on file  Other Topics Concern  . Not on file  Social History Narrative   Lives at home with her son.   Right-handed.   1 cup caffeine per day.     Review of Systems: A 12 point ROS discussed and pertinent positives are indicated in the HPI above.  All other systems are negative.  Review of Systems  Unable to perform ROS: Patient nonverbal    Vital Signs: BP 140/73 (BP Location: Right Arm)   Pulse 86   Temp 98.2 F (36.8 C) (Oral)   Resp 16   Ht 5' 2"  (1.575 m)   Wt 145 lb 15.1 oz (66.2 kg)   SpO2 100%   BMI 26.69 kg/m   Physical Exam  Constitutional: No distress.  Limited exam; patient does not allow examiner to perform abdominal exam.  HENT:  Head: Normocephalic.  Cortrak present  Cardiovascular: Regular rhythm and normal heart sounds.  tachycardic  Pulmonary/Chest: Effort normal and breath sounds normal. No respiratory distress.  Right sided port  Abdominal:  Unable to perform  Neurological: She is alert.  Skin: Skin is warm and dry. She is not diaphoretic.  Nursing note and vitals reviewed.    MD Evaluation Airway: Other (comments) Airway comments: Unable to assess - patient refuses to open mouth; cortrak in place Heart: WNL Abdomen: Other (comments) Abdomen comments: Patient refuses abdominal exam Chest/ Lungs: WNL ASA  Classification: 3 Mallampati/Airway Score: (Unable to assess - patient refuses to open mouth)   Imaging: Ct Angio Head W Or Wo Contrast  Result Date: 08/17/2017 CLINICAL DATA:  Focal neuro deficit. Altered mental status. Periods of unresponsiveness EXAM: CT ANGIOGRAPHY HEAD AND NECK TECHNIQUE: Multidetector CT imaging of the head and neck was performed using the standard protocol during bolus administration of intravenous contrast. Multiplanar CT image reconstructions and MIPs were obtained to evaluate the vascular anatomy.  Carotid stenosis measurements (when applicable) are obtained utilizing NASCET criteria, using the distal internal carotid diameter as the denominator. CONTRAST:  130m ISOVUE-370 IOPAMIDOL (ISOVUE-370) INJECTION 76% COMPARISON:  Noncontrast head CT earlier today. CTA head neck 06/09/2017 FINDINGS: CTA NECK FINDINGS Aortic arch: Normal.  Three vessel branching. Right carotid system: Limited by motion at the distal ICA. Visible vessels are smooth and widely patent. Left carotid system: Limited by motion at the distal left ICA. Visible vessels are smooth and widely patent. Vertebral arteries: No proximal subclavian flow limiting stenosis. Both vertebral arteries are patent in the neck. Skeleton: No acute finding Other neck: No acute finding. Upper chest: Porta catheter on the right.  No acute finding. Review of the MIP images confirms the above findings CTA HEAD FINDINGS Anterior circulation: No evidence of emergent large vessel occlusion or proximal flow limiting stenosis atherosclerotic narrowing and irregularity of medium size vessels (M2 level and beyond) that is extensive, see markings on MIP images. Negative for aneurysm. Posterior circulation: The vertebral and basilar arteries are smooth and widely patent. Symmetric flow in bilateral branches. Atherosclerotic irregularity of left more than right PCA branches. Negative for aneurysm Venous sinuses: Patent Anatomic variants: None significant Delayed phase: Not obtained Review of the MIP images confirms the above findings IMPRESSION: 1. No emergent large vessel occlusion or other acute finding. Stable from 06/09/2017. 2. Extensive atherosclerotic irregularity and narrowing of intracranial medium size vessels. 3. No significant atherosclerosis or stenosis in the neck. Electronically Signed   By: JMonte FantasiaM.D.   On: 08/17/2017 11:31   Dg Chest 1 View  Result Date: 08/22/2017 CLINICAL DATA:  Hypoxia, altered mental status EXAM: CHEST  1 VIEW COMPARISON:   the previous day's study FINDINGS: The left pigtail drain catheter is been removed. No pneumothorax evident. Blunting of the left lateral costophrenic angle suggesting small effusion. Stable right IJ port catheter. Atelectasis/infiltrate in the lung bases left greater than right, improved since prior study. Heart size upper limits normal for technique. Aortic Atherosclerosis (ICD10-170.0). Regional bones unremarkable. IMPRESSION: 1. Interval removal of left chest tube with no pneumothorax. 2. Possible small left pleural effusion. 3. Bibasilar atelectasis/consolidation, improved since previous exam Electronically Signed   By: DLucrezia EuropeM.D.   On: 08/22/2017 10:04   Ct Head Wo Contrast  Result Date: 08/19/2017 CLINICAL DATA:  Altered mental status.  Cephalopathy. EXAM: CT HEAD WITHOUT CONTRAST TECHNIQUE: Contiguous axial images were obtained from the base of the skull through the vertex without intravenous contrast. COMPARISON:  08/17/2017. FINDINGS: Brain: Diffusely enlarged ventricles and subarachnoid spaces. Patchy white matter low density in both cerebral hemispheres. Streak artifacts from a high density lead attached to the right frontal bone. No intracranial hemorrhage, mass lesion or CT evidence of acute infarction. Vascular: No hyperdense vessel or unexpected calcification. Skull: Normal. Negative for fracture or focal lesion. Sinuses/Orbits: Unremarkable. Other: None. IMPRESSION: 1. No acute abnormality. 2. Stable mild diffuse cerebral and cerebellar atrophy. 3. Stable marked chronic small vessel white matter ischemic changes in both cerebral hemispheres. Electronically Signed   By: SClaudie ReveringM.D.   On: 08/19/2017 13:50   Ct Head Wo Contrast  Result Date: 08/17/2017 CLINICAL DATA:  Bilateral arm weakness EXAM: CT HEAD WITHOUT CONTRAST TECHNIQUE: Contiguous axial images were obtained from the base of the skull through the vertex without intravenous contrast. COMPARISON:  06/09/2017 brain MRI  FINDINGS: Brain: Low-density in the posterior left frontal subcortical white matter is in close approximation to infarcts seen on 06/09/2017 brain MRI. There is a background of advanced chronic small vessel ischemia with confluent low-density in the cerebral white matter. Indistinct deep gray nuclei from  chronic ischemic injury, including discrete remote lacunar infarct in the right thalamus. No evident hemorrhage, mass, or hydrocephalus. Subcentimeter left parietal inner table calcification considered incidental. Vascular: No hyperdense vessel.  Atherosclerotic calcification. Skull: No evidence of fracture or bone lesion. Sinuses/Orbits: No pathologic finding. Bilateral cataract resection. Other: Motion degraded scan, best obtainable due to patient condition. IMPRESSION: 1. Motion degraded study without definite acute finding. 2. A small left cerebral white matter infarct was likely present on 06/09/2017 brain MRI. 3. Severe chronic small vessel ischemia. Electronically Signed   By: Monte Fantasia M.D.   On: 08/17/2017 09:19   Ct Angio Neck W Or Wo Contrast  Result Date: 08/17/2017 CLINICAL DATA:  Focal neuro deficit. Altered mental status. Periods of unresponsiveness EXAM: CT ANGIOGRAPHY HEAD AND NECK TECHNIQUE: Multidetector CT imaging of the head and neck was performed using the standard protocol during bolus administration of intravenous contrast. Multiplanar CT image reconstructions and MIPs were obtained to evaluate the vascular anatomy. Carotid stenosis measurements (when applicable) are obtained utilizing NASCET criteria, using the distal internal carotid diameter as the denominator. CONTRAST:  137m ISOVUE-370 IOPAMIDOL (ISOVUE-370) INJECTION 76% COMPARISON:  Noncontrast head CT earlier today. CTA head neck 06/09/2017 FINDINGS: CTA NECK FINDINGS Aortic arch: Normal.  Three vessel branching. Right carotid system: Limited by motion at the distal ICA. Visible vessels are smooth and widely patent. Left  carotid system: Limited by motion at the distal left ICA. Visible vessels are smooth and widely patent. Vertebral arteries: No proximal subclavian flow limiting stenosis. Both vertebral arteries are patent in the neck. Skeleton: No acute finding Other neck: No acute finding. Upper chest: Porta catheter on the right.  No acute finding. Review of the MIP images confirms the above findings CTA HEAD FINDINGS Anterior circulation: No evidence of emergent large vessel occlusion or proximal flow limiting stenosis atherosclerotic narrowing and irregularity of medium size vessels (M2 level and beyond) that is extensive, see markings on MIP images. Negative for aneurysm. Posterior circulation: The vertebral and basilar arteries are smooth and widely patent. Symmetric flow in bilateral branches. Atherosclerotic irregularity of left more than right PCA branches. Negative for aneurysm Venous sinuses: Patent Anatomic variants: None significant Delayed phase: Not obtained Review of the MIP images confirms the above findings IMPRESSION: 1. No emergent large vessel occlusion or other acute finding. Stable from 06/09/2017. 2. Extensive atherosclerotic irregularity and narrowing of intracranial medium size vessels. 3. No significant atherosclerosis or stenosis in the neck. Electronically Signed   By: JMonte FantasiaM.D.   On: 08/17/2017 11:31   Dg Chest Port 1 View  Result Date: 08/29/2017 CLINICAL DATA:  Leukocytosis.  History of CHF. EXAM: PORTABLE CHEST 1 VIEW COMPARISON:  08/27/2017 FINDINGS: Right-sided injectable port and feeding catheter in stable position. Enlarged cardiac silhouette. Calcific atherosclerotic disease of the aorta. Mediastinal contours appear intact. There is no evidence of pneumothorax. Left lower lobe airspace consolidation and/or left pleural effusion. Osseous structures are without acute abnormality. Soft tissues are grossly normal. IMPRESSION: Left lower lobe airspace consolidation and/or left pleural  effusion, new from patient's prior radiograph. Electronically Signed   By: DFidela SalisburyM.D.   On: 08/29/2017 16:32   Dg Chest Port 1 View  Result Date: 08/27/2017 CLINICAL DATA:  Dyspnea. EXAM: PORTABLE CHEST 1 VIEW COMPARISON:  Radiograph of August 22, 2017. FINDINGS: Stable cardiomegaly. Right internal jugular Port-A-Cath is unchanged in position. No pneumothorax is noted. Elevated right hemidiaphragm is noted with mild right basilar subsegmental atelectasis. Bony thorax is unremarkable. Feeding tube is  seen entering the stomach. Bony thorax is unremarkable. IMPRESSION: Stable cardiomegaly. Elevated right hemidiaphragm is noted with mild right basilar subsegmental atelectasis. Interval placement of feeding tube seen entering stomach. Electronically Signed   By: Marijo Conception, M.D.   On: 08/27/2017 09:20   Dg Chest Port 1 View  Result Date: 08/21/2017 CLINICAL DATA:  History of pneumothorax.  Chest tube in place. EXAM: PORTABLE CHEST 1 VIEW COMPARISON:  August 20, 2016 FINDINGS: No pneumothorax. The left chest tube is stable. A layering effusion with underlying opacity in seen on the right. Probable edema. No nodules or masses. Stable cardiomediastinal silhouette. Stable right Port-A-Cath. IMPRESSION: 1. Left-sided chest tube.  No pneumothorax. 2. Layering effusions bilaterally, right greater than left underlying atelectasis. 3. Pulmonary edema. Electronically Signed   By: Dorise Bullion III M.D   On: 08/21/2017 09:49   Dg Chest Port 1 View  Result Date: 08/20/2017 CLINICAL DATA:  Assess for pneumothorax. EXAM: PORTABLE CHEST 1 VIEW COMPARISON:  Chest radiograph performed earlier today at 4:32 a.m. FINDINGS: A right-sided chest port is noted ending about the mid SVC. Small bilateral pleural effusions are noted. Increased interstitial markings raise concern for pulmonary edema, worsened from the prior study. No pneumothorax is seen. A left chest pigtail catheter is noted in essentially unchanged  position. The cardiomediastinal silhouette is borderline normal in size. No acute osseous abnormalities are seen. IMPRESSION: 1. No pneumothorax seen. Left chest pigtail catheter noted in essentially unchanged position. 2. Small bilateral pleural effusions noted. Increased interstitial markings raise concern for pulmonary edema, worsened from the prior study. Electronically Signed   By: Garald Balding M.D.   On: 08/20/2017 22:50   Dg Chest Port 1 View  Result Date: 08/20/2017 CLINICAL DATA:  Respiratory failure. EXAM: PORTABLE CHEST 1 VIEW COMPARISON:  08/19/2017 FINDINGS: Endotracheal tube remains present with the tip approximately 3 cm above the carina. Stable positioning pigtail chest tube on the left and right-sided Port-A-Cath. Gastric decompression tube extends below the diaphragm. Lungs show slight increase in right basilar atelectasis. The left lower lobe shows improved aeration. No overt edema, evidence of pneumothorax or pleural fluid. IMPRESSION: Improved aeration of the left lower lung. Slight increase in right basilar atelectasis. Electronically Signed   By: Aletta Edouard M.D.   On: 08/20/2017 11:08   Dg Chest Port 1 View  Result Date: 08/19/2017 CLINICAL DATA:  Endotracheal tube is assessment EXAM: PORTABLE CHEST 1 VIEW COMPARISON:  August 18, 2017 FINDINGS: The heart size and mediastinal contours are stable. Endotracheal tube is identified with distal tip 4.5 cm from carina in good position. A nasogastric tube is identified with distal tip not included on film but is at least in the stomach. A right central venous line is identified with distal tip in the superior vena cava, unchanged. Left chest tube is unchanged. There is pulmonary vascular congestion. Probable left pleural effusion is identified. Consolidation of left lung base is unchanged. The visualized skeletal structures are stable. IMPRESSION: Endotracheal tube is identified with distal tip 4.5 cm from carina, in good position.  Pulmonary vascular congestion of the lungs. There is probable left pleural effusion with consolidation of left lung base unchanged compared prior exam. Electronically Signed   By: Abelardo Diesel M.D.   On: 08/19/2017 09:32   Dg Chest Port 1 View  Result Date: 08/18/2017 CLINICAL DATA:  Pneumothorax image timed for 1100hrs.  OG placement. EXAM: PORTABLE CHEST 1 VIEW COMPARISON:  08/18/2017 FINDINGS: Endotracheal tube is in place with tip approximately 3.5  centimeters above the carina. An orogastric tube is in place, tip off the image beyond the gastroesophageal junction. The patient has a RIGHT-sided Port-A-Cath with tip to level of superior vena cava. The patient has a LEFT-sided small bore chest tube. There is no pneumothorax. The heart is enlarged. Patient is rotated towards the LEFT. There is dense opacity at the LEFT lung base which obscures the LEFT hemidiaphragm. Mild subsegmental atelectasis in the RIGHT LOWER lobe, stable in appearance. IMPRESSION: 1. No pneumothorax. 2. Persistent dense LEFT LOWER lobe opacity. 3. Orogastric tube tip beyond the gastroesophageal junction. Electronically Signed   By: Nolon Nations M.D.   On: 08/18/2017 11:31   Dg Chest Port 1 View  Result Date: 08/18/2017 CLINICAL DATA:  Pneumothorax.  Shortness of breath. EXAM: PORTABLE CHEST 1 VIEW COMPARISON:  One-view chest x-ray 08/17/2017. FINDINGS: Endotracheal tube is stable. Right IJ catheter is stable. A left-sided chest tube is in place. No visible pneumothorax is present. Basilar atelectasis is present. Mild pulmonary vascular congestion is noted. IMPRESSION: 1. No significant pneumothorax with left-sided chest tube in place. 2. Left basilar airspace opacity likely representing small effusion and atelectasis. 3. Support apparatus is stable. Electronically Signed   By: San Morelle M.D.   On: 08/18/2017 07:07   Dg Chest Port 1 View  Result Date: 08/17/2017 CLINICAL DATA:  77 year old female with history of chest  tube in the left side. EXAM: PORTABLE CHEST 1 VIEW COMPARISON:  Chest x-ray 08/17/2017. FINDINGS: Compared to the prior examination there has been interval placement of a small bore left chest tube with tip in the apex of the left hemithorax. Previously noted left-sided pneumothorax is significantly decreased in size compared to the prior examination, now likely less than 5-10% of the volume of the left hemithorax. Right internal jugular single-lumen porta cath with tip terminating near the superior cavoatrial junction. Patient is intubated, with the tip of the endotracheal tube approximately 3 cm above the carina. Lung volumes are low. Persistent elevation of the right hemidiaphragm. Bibasilar linear opacities and linear opacities in the left mid lung, likely to reflect areas of subsegmental atelectasis and/or scarring. No definite consolidative airspace disease. No pleural effusions. No evidence of pulmonary edema. Heart size appears borderline enlarged. The patient is rotated to the left on today's exam, resulting in distortion of the mediastinal contours and reduced diagnostic sensitivity and specificity for mediastinal pathology. Aortic atherosclerosis. IMPRESSION: 1. Support apparatus, as above. 2. Regression of previously noted left-sided pneumothorax following chest tube placement, now extremely small. 3. Low lung volumes with bibasilar areas of subsegmental atelectasis and/or scarring. Electronically Signed   By: Vinnie Langton M.D.   On: 08/17/2017 20:41   Dg Chest Port 1 View  Result Date: 08/17/2017 CLINICAL DATA:  Difficult intubation.  Orogastric tube placement. EXAM: PORTABLE CHEST 1 VIEW COMPARISON:  Portable chest x-ray of Jun 09, 2017 FINDINGS: The endotracheal tube tip projects 1-2 cm above the carina. There is considerable subcutaneous emphysema however. There is a 10% or less left pneumothorax. There is subsegmental atelectasis in the right lower lung and in the left mid lung. There is  subcutaneous emphysema in the left axillary region and bilaterally at the base of the neck. The heart is normal in size. The pulmonary vascularity is not engorged. The porta catheter tip projects over the midportion of the SVC. No esophagogastric tube is observed. IMPRESSION: There is a 10% or less left sided pneumothorax. There is a large amount of subcutaneous emphysema and pneumomediastinum and a small  amount of pneumopericardium. The endotracheal tube tip projects approximately 1-2 cm above the carina. Subsegmental atelectasis in both lungs. No CHF.  There is calcification in the wall of the aortic arch. These results will be called to the ordering clinician or representative by the Radiologist Assistant, and communication documented in the PACS or zVision Dashboard. Electronically Signed   By: David  Martinique M.D.   On: 08/17/2017 14:37   Dg Abd Portable 1v  Result Date: 08/23/2017 CLINICAL DATA:  Nasogastric tube placement. EXAM: PORTABLE ABDOMEN - 1 VIEW COMPARISON:  08/18/2017. FINDINGS: Normal bowel gas pattern. No nasogastric tube seen. Mild scoliosis and lower lumbar spine fixation hardware. IMPRESSION: No nasogastric tube visualized. Electronically Signed   By: Claudie Revering M.D.   On: 08/23/2017 11:19   Dg Abd Portable 1v  Result Date: 08/18/2017 CLINICAL DATA:  Pneumothorax image timed for 1100hrs.  OG placement. EXAM: PORTABLE ABDOMEN - 1 VIEW COMPARISON:  CT of the abdomen and pelvis on 08/17/2017 FINDINGS: Orogastric tube has been placed, tip overlying the level of the proximal stomach. Patient is rotated towards the LEFT. There is persistent patchy density at the LEFT lung base. IMPRESSION: Orogastric tube tip to level of the stomach. LEFT LOWER lobe opacity. Electronically Signed   By: Nolon Nations M.D.   On: 08/18/2017 11:32   Dg Abd Portable 1v  Result Date: 08/17/2017 CLINICAL DATA:  Pre orogastric tube placement radiograph. EXAM: PORTABLE ABDOMEN - 1 VIEW COMPARISON:  Abdominal and  pelvic CT scan of May 15, 2017 FINDINGS: There is a moderate amount of gas and stool within the pelvis. The patient has undergone previous lower lumbar fusion procedure. There numerous tubes and catheters overlying the lower pelvis. There is pneumomediastinum and pneumopericardium and likely hydropneumothorax on the left. IMPRESSION: The bowel gas pattern is within the limits of normal. Please see the accompanying chest x-ray dictation regarding the left-sided pneumothorax, pneumomediastinum, and pneumopericardium. Electronically Signed   By: David  Martinique M.D.   On: 08/17/2017 14:39   Dg Swallowing Func-speech Pathology  Result Date: 08/27/2017 Objective Swallowing Evaluation: Type of Study: MBS-Modified Barium Swallow Study  Patient Details Name: SHERREE SHANKMAN MRN: [id number] Date of Birth: Nov 26, 1940 Today's Date: 08/27/2017 Time: SLP Start Time (ACUTE ONLY): 1500 -SLP Stop Time (ACUTE ONLY): 1520 SLP Time Calculation (min) (ACUTE ONLY): 20 min Past Medical History: Past Medical History: Diagnosis Date . Allergic rhinitis, cause unspecified  . Benign neoplasm of colon  . Chronic diastolic CHF (congestive heart failure) ([place])  . CVA (cerebral vascular accident) ([place]) 05/2017 . Esophageal reflux  . Excessive daytime sleepiness  . 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 GI hemorrhage 2011  recurrent . Hyperlipidemia  . Osteoporosis  . Polymyositis ([place])  . Unspecified essential hypertension  . Vitamin D deficiency  Past Surgical History: Past Surgical History: Procedure Laterality Date . ABDOMINAL HYSTERECTOMY    partial . BACK SURGERY   . COLONOSCOPY  01/09/2011  others also . COLONOSCOPY  05/29/2011  Procedure: COLONOSCOPY;  Surgeon: Gatha Mayer, MD;  Location: [facility] ENDOSCOPY;  Service: Endoscopy;  Laterality: N/A;  Greggory Brandy Carlean Purl . COLONOSCOPY WITH PROPOFOL N/A 02/16/2014  Procedure: COLONOSCOPY WITH PROPOFOL;  Surgeon: Milus Banister, MD;  Location: [facility] ENDOSCOPY;  Service: Endoscopy;  Laterality: N/A; .  ESOPHAGOGASTRODUODENOSCOPY  12/08/2010  others also . ESOPHAGOGASTRODUODENOSCOPY N/A 08/17/2017  Procedure: ESOPHAGOGASTRODUODENOSCOPY (EGD);  Surgeon: Jackquline Denmark, MD;  Location[facility];  Service: Endoscopy;  Laterality: N/A; . ESOPHAGOGASTRODUODENOSCOPY (EGD) WITH PROPOFOL N/A 02/16/2014  Procedure: ESOPHAGOGASTRODUODENOSCOPY (  EGD) WITH PROPOFOL;  Surgeon: Milus Banister, MD;  Location: [facility] ENDOSCOPY;  Service: Endoscopy;  Laterality: N/A; . EUS   . HOT HEMOSTASIS  05/29/2011  Procedure: HOT HEMOSTASIS (ARGON PLASMA COAGULATION/BICAP);  Surgeon: Gatha Mayer, MD;  Location: [facility];  Service: Endoscopy;  Laterality: N/A; . LUMBAR LAMINECTOMY   . ROTATOR CUFF REPAIR Bilateral  . TONSILLECTOMY  age 50 HPI: Pt is a 46yoF with hx Polymyositis, CVA, Gastric leiomyoma, HTN, GERD, and dCHF, who presented to ER with BRBPR, hypotension, and AMS. Difficult intubation 8/5, extubated 8/8. EGD showed no signs of bleeding. CXR post-intubation showed L PTX. CT Head negative. BSE 06/13/17 recommended Dys 3 diet and thin liquids, advanced to regular textures prior to d/c. MBS in May 2019 showed an overall functional swallow with a single incident of flash penetration. Pt was noted to cough during MBS but not in the setting of airway compromise. There was also concern for a prominent CP and a possible Zenker's diverticulum developing, but no backflow was observed. Dys 3 diet and thin liquids were recommended at that time.  Subjective: Pt seen in radiology for MBS to detemine appropriateness for any po intake or therapeutic feeds with speech therapy Assessment / Plan / Recommendation CHL IP CLINICAL IMPRESSIONS 08/27/2017 Clinical Impression Pt presents with severe oral, moderate pharyngeal dysphagia, characterized as follows: ORALLY, pt exhibits minimal labial movement/seal, significantly poor oral prep and propulsion, with anterior leakage, oral holding, and poor bolus formation noted across consistencies. Nectar thick  liquid was suctioned from pt oral cavity, as no posterior propulsion or swallow occurred. Poor bolus control and posterior propulsion also noted with thin liquid and puree trials, with post-swallow oral residue seen. PHARYNGEALLY, pt exhibits delayed swallow reflex, with trigger noted at the pyriform sinus. Aspiration of thin liquid via straw was noted during the swallow, with minimal and ineffective cough response elicited. No penetration or aspiration of puree consistency was noted, however, pt is at significantly high risk given poor oral prep and delay of reflex/airway closure. At this time, strict NPO status is recommended for primary nutrition, hydration, and medications. When alert and participatory, pt may benefit from therapeutic trials of individual ice chips and small boluses of puree during dysphagia therapy with speech pathology only. SLP will follow for trial of therapy and education. RN and MD informed of results and recommendations.  SLP Visit Diagnosis Dysphagia, oropharyngeal phase (R13.12) Impact on safety and function Severe aspiration risk;Risk for inadequate nutrition/hydration   CHL IP TREATMENT RECOMMENDATION 08/27/2017 Treatment Recommendations Therapy as outlined in treatment plan below   Prognosis 08/27/2017 Prognosis for Safe Diet Advancement Fair Barriers to Reach Goals Cognitive deficits CHL IP DIET RECOMMENDATION 08/27/2017 SLP Diet Recommendations NPO;Alternative means - long-term;Other (Comment) Medication Administration Via alternative means   CHL IP OTHER RECOMMENDATIONS 08/27/2017 Oral Care Recommendations Oral care QID Other Recommendations Have oral suction available   CHL IP FOLLOW UP RECOMMENDATIONS 08/27/2017 Follow up Recommendations Skilled Nursing facility;24 hour supervision/assistance   CHL IP FREQUENCY AND DURATION 08/27/2017 Speech Therapy Frequency (ACUTE ONLY) min 2x/week Treatment Duration 2 weeks      CHL IP ORAL PHASE 08/27/2017 Oral Phase Impaired Oral - Nectar Teaspoon  Right anterior bolus loss;Impaired mastication;Lingual pumping;Reduced posterior propulsion;Holding of bolus;Right pocketing in lateral sulci;Left pocketing in lateral sulci;Pocketing in anterior sulcus;Lingual/palatal residue;Piecemeal swallowing;Delayed oral transit;Decreased bolus cohesion;Premature spillage Oral - Thin Straw Right anterior bolus loss;Lingual pumping;Reduced posterior propulsion;Holding of bolus;Right pocketing in lateral sulci;Left pocketing in lateral sulci;Pocketing in anterior sulcus;Lingual/palatal residue;Piecemeal swallowing;Delayed oral  transit;Decreased bolus cohesion;Premature spillage Oral - Puree Impaired mastication;Weak lingual manipulation;Lingual pumping;Reduced posterior propulsion;Holding of bolus;Right pocketing in lateral sulci;Left pocketing in lateral sulci;Pocketing in anterior sulcus;Lingual/palatal residue;Piecemeal swallowing;Delayed oral transit;Decreased bolus cohesion;Premature spillage  CHL IP PHARYNGEAL PHASE 08/27/2017 Pharyngeal Phase Impaired Pharyngeal- Nectar Teaspoon No swallow elicited - nectar thick material was suctioned from oral cavity.  Pharyngeal- Thin Straw Delayed swallow initiation-pyriform sinuses;Reduced laryngeal elevation;Reduced airway/laryngeal closure;Reduced tongue base retraction;Penetration/Aspiration during swallow;Moderate aspiration;Pharyngeal residue - valleculae Pharyngeal Material enters airway, passes BELOW cords and not ejected out despite cough attempt by patient - cough was weak and ineffective in removing aspirate. Pharyngeal- Puree Delayed swallow initiation-pyriform sinuses;Reduced anterior laryngeal mobility;Reduced laryngeal elevation;Reduced tongue base retraction;Pharyngeal residue - valleculae  CHL IP CERVICAL ESOPHAGEAL PHASE 08/27/2017 Cervical Esophageal Phase [facility] Celia B. Quentin [facility], CCC-SLP Speech Language Pathologist [phone] Shonna Chock 08/27/2017, 4:09 PM              Ct Angio Abd/pel W/ And/or  W/o  Result Date: 08/17/2017 CLINICAL DATA:  Gastrointestinal bleeding EXAM: CTA ABDOMEN AND PELVIS wITHOUT AND WITH CONTRAST TECHNIQUE: Multidetector CT imaging of the abdomen and pelvis was performed using the standard protocol during bolus administration of intravenous contrast. Multiplanar reconstructed images and MIPs were obtained and reviewed to evaluate the vascular anatomy. CONTRAST:  163m ISOVUE-370 IOPAMIDOL (ISOVUE-370) INJECTION 76% COMPARISON:  05/15/2017 FINDINGS: VASCULAR Gastrointestinal bleeding: There is no blush of contrast within the gastrointestinal tract to suggest active gastrointestinal bleeding. Aorta: Nonaneurysmal and patent. Scattered atherosclerotic calcifications. Celiac: Severe narrowing just beyond the origin. Branch vessels patent. SMA: Patent and diminutive. Renals: Bilateral single renal arteries are patent. IMA: Diminutive and patent. Inflow: Common, internal, and external iliac arteries are patent. Proximal Outflow: Grossly patent Veins: No obvious DVT. Review of the MIP images confirms the above findings. NON-VASCULAR Lower chest: There is consolidation at the left lung base. A large left pneumothorax is present. There is a large amount of mediastinal emphysema. There is a tiny right anterior pneumothorax. There is emphysema in the anterior chest soft tissues. Hepatobiliary: Tiny hypodensity in the right lobe of the liver towards the dome. It is stable. Gallbladder is within normal limits. Pancreas: Unremarkable Spleen: Unremarkable Adrenals/Urinary Tract: Benign cysts are present in both kidneys. There is high-density material in the collecting system of both kidneys which conforms to the collecting system likely representing a test dose of contrast with excretion. Adrenal glands are within normal limits. Foley catheter decompresses the bladder. There is bladder wall thickening. Stomach/Bowel: Stomach and duodenum are decompressed. No evidence of small-bowel obstruction.  Sigmoid diverticulosis is present. There is no evidence of acute diverticulitis. Lymphatic: No abnormal retroperitoneal adenopathy. Reproductive: Uterus is absent.  Adnexa are within normal limits. Other: No free fluid. Musculoskeletal: Postoperative changes from posterior L4-5 fusion. L4 superior anterior endplate compression fracture is stable. IMPRESSION: VASCULAR No evidence of gastrointestinal bleeding. Severe narrowing just beyond the origin of the celiac axis. NON-VASCULAR Consolidation at the left lung base. Large left pneumothorax. Tiny right pneumothorax. Extensive pneumomediastinum. Postoperative changes in the lumbar spine. Stable L4 compression deformity. Findings related to the left pneumothorax were discussed with Dr. RDebbora Dus Electronically Signed   By: AMarybelle KillingsM.D.   On: 08/17/2017 18:47    Labs:  CBC: Recent Labs    08/28/17 1134 08/29/17 0849 08/30/17 0313 09/01/17 0323  WBC 13.2* 19.8* 12.4* 17.2*  HGB 10.7* 9.9* 10.4* 10.3*  HCT 34.1* 31.7* 33.0* 33.4*  PLT 313 310 333 335    COAGS: Recent Labs  06/09/17 1648 08/17/17 0638 08/18/17 0416  INR 1.01 0.94 1.13  APTT 95*  --   --     BMP: Recent Labs    08/26/17 0340 08/28/17 1134 08/30/17 0313 09/01/17 0323  NA 139 140 141 145  K 3.8 4.7 3.8 3.4*  CL 103 105 104 104  CO2 27 27 28 26   GLUCOSE 111* 104* 144* 139*  BUN 10 13 20 23   CALCIUM 8.4* 8.7* 8.6* 8.9  CREATININE 0.43* 0.43* 0.45 0.54  GFRNONAA >60 >60 >60 >60  GFRAA >60 >60 >60 >60    LIVER FUNCTION TESTS: Recent Labs    06/15/17 0809 08/17/17 0638 08/25/17 0414 08/26/17 0340  BILITOT 0.9 0.8 0.8 0.6  AST 24 16 28 29   ALT 14 18 25 26   ALKPHOS 51 43 42 53  PROT 5.8* 5.7* 4.6* 5.2*  ALBUMIN 3.2* 3.2* 2.5* 2.7*    TUMOR MARKERS: No results for input(s): AFPTM, CEA, CA199, CHROMGRNA in the last 8760 hours.  Assessment and Plan:  Dysphagia - patient with history of CVA, polymyositis presented to [facility] ED on 8/5 due to BRBPR.  While in ED patient became unresponsive several times, code stroke was called, CT head/angio were negative for acute findings - it was thought that these episodes were vasovagal in nature as patient became hypotensive/bradycardic during. Patient was admitted for GIB workup - endoscopy and CTA abdomen/pelvis were both negative. She has had few more episodes of BRBPR however hemoglobin has remained stable throughout.  While admitted she required intubation for AMS and placement of left pigtail chest tube due to pneumothorax, which has resolved. She is now extubated although AMS continues. She has failed several swallow studies and Cortrak was placed after 3 attempts to pass NG. Request was placed to IR for gastrostomy tube placement. Family initially was unsure regarding g-tube placement and wanted to wait a few days; there was some concern that patient was too weak to proceed. Plan per internal medicine note is to d/c patient back to SNF with g-tube for continued parenteral nutrition.  Patient WBC 17.2 today however is on chronic steroid therapy due to polymyositis and her WBC has trended down from 24.1 since admission; she has remained afebrile throughout. H/H stable at 10.3/33.4, plt 335. She was previously on ASA/Plavix for CVA although this has been held since 8/5 and she is not on any other anti-coagulation. INR in ED 1.13 (8/6) - will repeat prior to procedure. CTA abdomen/pelvis reviewed by Dr. Barbie Banner on 8/19 who agrees patient is candidate for percutaneous gastrostomy tube placement. Discussed gastrostomy tube today with daughter [name] [name] who is amenable to proceed with procedure as long as patient is stable.  Risks and benefits discussed with the patient's daughter including, but not limited to the need for a barium enema during the procedure, bleeding, infection, peritonitis, or damage to adjacent structures.  All of the patient's daughter's questions were answered, patient's daughter is agreeable  to proceed.  Consent signed and in chart.  Planned for tomorrow AM in IR.   Thank you for this interesting consult.  I greatly enjoyed meeting [name] [name] and look forward to participating in their care.  A copy of this report was sent to the requesting provider on this date.  Electronically Signed: Joaquim Nam, PA-C 09/01/2017, 1:24 PM   I spent a total of 55 Mi[place]utes in face to face in clinical consultation, greater than 50% of which was counseling/coordinating care for percutaneous gastrostomy tube placement.

## 2017-09-01 NOTE — Progress Notes (Signed)
Patient's cortrak was clogged this AM, unable to give medications via tube due to clog. Per tube feed unclogging policy, attempted unclogging with amylase/protease/lipase and Na+ Bicarb with 10cc lukewarm water - unsuccessful. Spoke in person to Dr. Wynetta Emery about this. IR unable to place PEG tube today due to heavy schedule, so [facility] was called and order was placed for them to unclog tube today. Cortrak team not available today.

## 2017-09-02 ENCOUNTER — Inpatient Hospital Stay (HOSPITAL_COMMUNITY): Payer: Medicare Other

## 2017-09-02 ENCOUNTER — Encounter (HOSPITAL_COMMUNITY): Payer: Self-pay | Admitting: Interventional Radiology

## 2017-09-02 ENCOUNTER — Inpatient Hospital Stay (HOSPITAL_COMMUNITY)
Admit: 2017-09-02 | Discharge: 2017-09-02 | Disposition: A | Discharge status: Home / Self Care | Payer: Medicare Other | Attending: Internal Medicine | Admitting: Internal Medicine

## 2017-09-02 HISTORY — PX: IR GASTROSTOMY TUBE MOD SED: IMG625

## 2017-09-02 LAB — CBC
HEMATOCRIT: 34.9 % — AB (ref 36.0–46.0)
Hemoglobin: 10.6 g/dL — ABNORMAL LOW (ref 12.0–15.0)
MCH: 26.8 pg (ref 26.0–34.0)
MCHC: 30.4 g/dL (ref 30.0–36.0)
MCV: 88.1 fL (ref 78.0–100.0)
Platelets: 357 10*3/uL (ref 150–400)
RBC: 3.96 MIL/uL (ref 3.87–5.11)
RDW: 19 % — ABNORMAL HIGH (ref 11.5–15.5)
WBC: 9.7 10*3/uL (ref 4.0–10.5)

## 2017-09-02 LAB — PROTIME-INR
INR: 0.97
Prothrombin Time: 12.8 seconds (ref 11.4–15.2)

## 2017-09-02 LAB — COMPREHENSIVE METABOLIC PANEL
ALBUMIN: 2.3 g/dL — AB (ref 3.5–5.0)
ALK PHOS: 50 U/L (ref 38–126)
ALT: 36 U/L (ref 0–44)
AST: 27 U/L (ref 15–41)
Anion gap: 9 (ref 5–15)
BUN: 19 mg/dL (ref 8–23)
CALCIUM: 8 mg/dL — AB (ref 8.9–10.3)
CO2: 26 mmol/L (ref 22–32)
Chloride: 107 mmol/L (ref 98–111)
Creatinine, Ser: 0.44 mg/dL (ref 0.44–1.00)
GFR calc Af Amer: >60 mL/min (ref >60)
GFR calc non Af Amer: >60 mL/min (ref >60)
GLUCOSE: 179 mg/dL — AB (ref 70–99)
POTASSIUM: 3 mmol/L — AB (ref 3.5–5.1)
SODIUM: 142 mmol/L (ref 135–145)
Total Bilirubin: 0.7 mg/dL (ref 0.3–1.2)
Total Protein: 4.5 g/dL — ABNORMAL LOW (ref 6.5–8.1)

## 2017-09-02 LAB — GLUCOSE, CAPILLARY
GLUCOSE-CAPILLARY: 86 mg/dL (ref 70–99)
GLUCOSE-CAPILLARY: 92 mg/dL (ref 70–99)
GLUCOSE-CAPILLARY: 88 mg/dL (ref 70–99)
Glucose-Capillary: 117 mg/dL — ABNORMAL HIGH (ref 70–99)
Glucose-Capillary: 61 mg/dL — ABNORMAL LOW (ref 70–99)
Glucose-Capillary: 81 mg/dL (ref 70–99)
Glucose-Capillary: 82 mg/dL (ref 70–99)
Glucose-Capillary: 93 mg/dL (ref 70–99)

## 2017-09-02 LAB — MAGNESIUM: Magnesium: 2.1 mg/dL (ref 1.7–2.4)

## 2017-09-02 MED ORDER — FENTANYL CITRATE (PF) 100 MCG/2ML IJ SOLN
INTRAMUSCULAR | Status: AC | PRN
  Administered 2017-09-02 (×2): 25 ug via INTRAVENOUS

## 2017-09-02 MED ORDER — CEFAZOLIN SODIUM-DEXTROSE 2-4 GM/100ML-% IV SOLN
INTRAVENOUS | Status: AC
  Administered 2017-09-02: 2 g via INTRAVENOUS
  Filled 2017-09-02: qty 100

## 2017-09-02 MED ORDER — DEXTROSE 5 % IV SOLN
INTRAVENOUS | Status: DC
  Administered 2017-09-02 – 2017-09-06 (×7): via INTRAVENOUS

## 2017-09-02 MED ORDER — WHITE PETROLATUM EX OINT
TOPICAL_OINTMENT | CUTANEOUS | Status: AC
  Administered 2017-09-02: 0.2
  Filled 2017-09-02: qty 28.35

## 2017-09-02 MED ORDER — LIDOCAINE HCL 1 % IJ SOLN
INTRAMUSCULAR | Status: AC
  Filled 2017-09-02: qty 20

## 2017-09-02 MED ORDER — MIDAZOLAM HCL 2 MG/2ML IJ SOLN
INTRAMUSCULAR | Status: AC
  Filled 2017-09-02: qty 2

## 2017-09-02 MED ORDER — MIDAZOLAM HCL 2 MG/2ML IJ SOLN
INTRAMUSCULAR | Status: AC | PRN
  Administered 2017-09-02 (×2): 1 mg via INTRAVENOUS

## 2017-09-02 MED ORDER — IOPAMIDOL (ISOVUE-300) INJECTION 61%
INTRAVENOUS | Status: AC
  Administered 2017-09-02: 15 mL
  Filled 2017-09-02: qty 50

## 2017-09-02 MED ORDER — POTASSIUM CHLORIDE 10 MEQ/100ML IV SOLN
10.0000 meq | INTRAVENOUS | Status: AC
  Administered 2017-09-02 (×4): 10 meq via INTRAVENOUS
  Filled 2017-09-02 (×4): qty 100

## 2017-09-02 MED ORDER — DEXTROSE 50 % IV SOLN
INTRAVENOUS | Status: AC
  Administered 2017-09-02: 25 mL
  Filled 2017-09-02: qty 50

## 2017-09-02 MED ORDER — FENTANYL CITRATE (PF) 100 MCG/2ML IJ SOLN
INTRAMUSCULAR | Status: AC
  Filled 2017-09-02: qty 2

## 2017-09-02 MED ORDER — GLUCAGON HCL RDNA (DIAGNOSTIC) 1 MG IJ SOLR
INTRAMUSCULAR | Status: AC
  Filled 2017-09-02: qty 1

## 2017-09-02 MED ORDER — LIDOCAINE HCL 1 % IJ SOLN
INTRAMUSCULAR | Status: AC | PRN
  Administered 2017-09-02: 5 mL

## 2017-09-02 NOTE — Procedures (Signed)
Interventional Radiology Procedure Note  Procedure: Placement of percutaneous 20F pull-through gastrostomy tube. Complications: None Recommendations: - NPO except for sips and chips remainder of today and overnight - Maintain G-tube to LWS until tomorrow morning  - May advance diet as tolerated and begin using tube tomorrow morning  Signed,  Rainee Sweatt K. Jaqualyn Juday, MD   

## 2017-09-02 NOTE — Progress Notes (Signed)
Called IR for clarification of order Gtube to low wall suction.  Pt is to have Gtube to low wall suction until tomorrow.  Only sips and chips today.  Dr. Laurence Ferrari wants stomach to rest until tomorrow unless critical medication is needed.

## 2017-09-02 NOTE — Progress Notes (Signed)
EEG complete - results pending 

## 2017-09-02 NOTE — Progress Notes (Signed)
PROGRESS NOTE    [name] [name]  NLZ:[id number] DOB: [birth year] DOA: 08/17/2017 PCP: [name], MD    Brief Narrative:  77yo female SNF resident with a hx of polymyositis, CVA with complete right hemiparesis, contracted, gastric leiomyoma, hypertension, GERD, and diastolic CHF who was brought to the ED w/ bright red blood per rectum. She had episodes of hypotension and unresponsiveness in the ED and required intubation. She was transfused 2 unit of PRBC transfusion.  She was extubated on 8/8.  Gastroenterology was consulted, underwent EGD.  CT abdomen and pelvis without any acute abnormality.  Aspirin Plavix have been on hold.  Considering patient being contracted, no further endoscopic work-up is planned.  Patient had few other episodes of bloody stools, however improved.  Patient failed swallow evaluation multiple times.  Dobbhoff tube is placed.  s/p palliative care meeting on 08/30/2017 for further decide about goals of care.  pt is DNR.  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  No further endoscopic work up per GI team.  Resolved. Continues to have intermittent episodes of bloody bowel movements.  H&H remains stable.  EGD on 8/15 by Dr Lyndel Safe is negative for source of bleeding.  So far she has received 3 units of prbc transfusion.  CT abd and pelvis on admission negative for acute bleed.  -aspirin and Plavix continue to be on hold  Acute respiratory failure with hypoxia:  Differential include aspiration pneumonia due to dysphagia from stroke and pulm edema.  Improved. She was intubated on 8/5 and extubated on 8/9.  Completed antibiotics course  Encephalopathy with Dysphagia: -Secondary to hemorrhagic shock with hypoperfusion, aspiration pneumonia and prior CVA with atrophy - Has failed numerous swallow studies  - family agreedto have  Cortrak placed 8/12/19after 3 failed attempts at NG placement 08/23/17 - Family met with palliative care team and decided they wanted to pursue PEG placement, despite overall very poor prognosis -PEG tube placed in radiology today, will start tube feeds per IR recommendations tomorrow -In terms of encephalopathy, will stop morphine, check EEG and repeat CT head  H/o CVA both aspirin and plavix held due to bleeding.   Hypertension: suboptimally controlled. -continue metoprolol to 75 mg BID,  amlodipine 10 mg daily.    H/o polymyositis:  Chronic adrenal insufficiency.  On cell cept and prednisone.  -continued  Deconditioning.  Due to CVA AND Polymyositis.  Bed bound with contractures on lower extremities.  Will need SNF on discharge.   Vascular dementia -the patient's family is very concerned about dementia.  They had an appointment to establish with [facility] however she missed the appointment because of being hospitalized.  She will need to follow-up with [facility] for further evaluation.  She was scheduled to see Venancio Poisson NP.    DVT prophylaxis:SCDs Code Status:DNR Family Communication: discussed with daughter, son and extended family at bedside Disposition Plan: SNF when mentation a little better  Consultants:   Gastroenterology   Palliative care.   PCCM  SLP  Procedures: EGD   Antimicrobials:none.   Subjective: -opens eyes to verbal commands moans and makes a few groaning sounds  Objective: Vitals:   09/02/17 0925 09/02/17 0932 09/02/17 1013 09/02/17 1030  BP: 91/62 110/68 108/61 115/62  Pulse: (!) 113 (!) 110 86   Resp: (!) _0 Temp:   98.2 F (36.8 C)  TempSrc:   Oral   SpO2: 100% 98% 100% 99%  Weight:      Height:        Intake/Output Summary (Last 24 hours) at 09/02/2017 1409 Last data filed at 09/02/2017 0000 Gross per 24 hour  Intake 585.25 ml  Output 450 ml  Net 135.25 ml   Filed  Weights   08/26/17 0500 08/27/17 2012 08/30/17 0609  Weight: 65 kg 65.1 kg 66.2 kg   Examination:  Gen: debilitated chronically ill-appearing female, nonverbal, moans and groans HEENT: PERRLA, Neck supple, no JVD Lungs: rhonchi at the bases CVS: RRR,No Gallops,Rubs or new Murmurs Abd: soft, Non tender, non distended, BS present Extremities: No edema, right-sided contractures Skin: no new rashes   Data Reviewed: I have personally reviewed following labs and imaging studies  CBC: Recent Labs  Lab 08/28/17 1134 08/29/17 0849 08/30/17 0313 09/01/17 0323 09/02/17 0657  WBC 13.2* 19.8* 12.4* 17.2* 9.7  HGB 10.7* 9.9* 10.4* 10.3* 10.6*  HCT 34.1* 31.7* 33.0* 33.4* 34.9*  MCV 84.6 84.8 84.8 86.5 88.1  PLT 313 310 333 335 440   Basic Metabolic Panel: Recent Labs  Lab 08/28/17 1134 08/30/17 0313 09/01/17 0323 09/02/17 1157  NA 140 141 145 142  K 4.7 3.8 3.4* 3.0*  CL 105 104 104 107  CO2 _0 GLUCOSE 104* 144* 139* 179*  BUN _1 CREATININE 0.43* 0.45 0.54 0.44  CALCIUM 8.7* 8.6* 8.9 8.0*  MG 2.1  --   --  2.1   GFR: Estimated Creatinine Clearance: 52.5 mL/min (by C-G formula based on SCr of 0.44 mg/dL). Liver Function Tests: Recent Labs  Lab 09/02/17 1157  AST 27  ALT 36  ALKPHOS 50  BILITOT 0.7  PROT 4.5*  ALBUMIN 2.3*   No results for input(s): LIPASE, AMYLASE in the last 168 hours. No results for input(s): AMMONIA in the last 168 hours. Coagulation Profile: Recent Labs  Lab 09/02/17 0657  INR 0.97   Cardiac Enzymes: No results for input(s): CKTOTAL, CKMB, CKMBINDEX, TROPONINI in the last 168 hours. BNP (last 3 results) No results for input(s): PROBNP in the last 8760 hours. HbA1C: No results for input(s): HGBA1C in the last 72 hours. CBG: Recent Labs  Lab 09/02/17 0009 09/02/17 0449 09/02/17 0747 09/02/17 1142 09/02/17 1227  GLUCAP 117* 81 86 61* 92   Lipid Profile: No results for input(s): CHOL, HDL, LDLCALC, TRIG,  CHOLHDL, LDLDIRECT in the last 72 hours. Thyroid Function Tests: No results for input(s): TSH, T4TOTAL, FREET4, T3FREE, THYROIDAB in the last 72 hours. Anemia Panel: No results for input(s): VITAMINB12, FOLATE, FERRITIN, TIBC, IRON, RETICCTPCT in the last 72 hours. Sepsis Labs: No results for input(s): PROCALCITON, LATICACIDVEN in the last 168 hours.  No results found for this or any previous visit (from the past 240 hour(s)).   Radiology Studies: Dg Abd 1 View  Result Date: 09/01/2017 CLINICAL DATA:  Evaluate feeding tube EXAM: ABDOMEN - 1 VIEW COMPARISON:  08/23/2017 FINDINGS: Single fluoroscopic image of the abdomen. According to study nodes, feeding tube with unclogged in radiology by guidewire. Total fluoroscopy time was 24 seconds. Subsegmental atelectasis at the right base. Probable tiny left effusion. Tip of the feeding tube projects over the distal stomach. IMPRESSION: Tip of feeding tube projects over the distal stomach Electronically Signed   By: Donavan Foil M.D.   On: 09/01/2017 19:59   Ir Gastrostomy Tube Mod Sed  Result Date: 09/02/2017 INDICATION: 77 year old female with dysphagia. She is  currently taking enteral nutrition via a transpyloric feeding tube and presents for placement of a more durable percutaneous gastrostomy tube. EXAM: Fluoroscopically guided placement of percutaneous pull-through gastrostomy tube Interventional Radiologist:  Criselda Peaches, MD MEDICATIONS: 2 g Ancef; Antibiotics were administered within 1 hour of the procedure. ANESTHESIA/SEDATION: Versed 2 mg IV; Fentanyl 50 mcg IV Moderate Sedation Time:  10 minutes The patient was continuously monitored during the procedure by the interventional radiology nurse under my direct supervision. CONTRAST:  15 mL Isovue-300 FLUOROSCOPY TIME:  Fluoroscopy Time: 1 minutes 54 seconds (7 mGy). COMPLICATIONS: None immediate. PROCEDURE: Informed written consent was obtained from the patient after a thorough discussion of  the procedural risks, benefits and alternatives. All questions were addressed. Maximal Sterile Barrier Technique was utilized including caps, mask, sterile gowns, sterile gloves, sterile drape, hand hygiene and skin antiseptic. A timeout was performed prior to the initiation of the procedure. Maximal barrier sterile technique utilized including caps, mask, sterile gowns, sterile gloves, large sterile drape, hand hygiene, and chlorhexadine skin prep. An angled catheter was advanced over a wire under fluoroscopic guidance through the nose, down the esophagus and into the body of the stomach. The stomach was then insufflated with several 100 ml of air. Fluoroscopy confirmed location of the gastric bubble, as well as inferior displacement of the barium stained colon. Under direct fluoroscopic guidance, a single T-tack was placed, and the anterior gastric wall drawn up against the anterior abdominal wall. Percutaneous access was then obtained into the mid gastric body with an 18 gauge sheath needle. Aspiration of air, and injection of contrast material under fluoroscopy confirmed needle placement. An Amplatz wire was advanced in the gastric body and the access needle exchanged for a 9-French vascular sheath. A snare device was advanced through the vascular sheath and an Amplatz wire advanced through the angled catheter. The Amplatz wire was successfully snared and this was pulled up through the esophagus and out the mouth. A 20-French Alinda Dooms MIC-PEG tube was then connected to the snare and pulled through the mouth, down the esophagus, into the stomach and out to the anterior abdominal wall. Hand injection of contrast material confirmed intragastric location. The T-tack retention suture was then cut. The pull through peg tube was then secured with the external bumper and capped. The patient will be observed for several hours with the newly placed tube on low wall suction to evaluate for any post procedure  complication. The patient tolerated the procedure well, there is no immediate complication. IMPRESSION: Successful placement of a 20 French pull through gastrostomy tube. Electronically Signed   By: Jacqulynn Cadet M.D.   On: 09/02/2017 10:24   Scheduled Meds: . amLODipine  10 mg Oral Daily  . chlorhexidine  15 mL Mouth Rinse BID  . Chlorhexidine Gluconate Cloth  6 each Topical Daily  . feeding supplement (PRO-STAT SUGAR FREE 64)  30 mL Per Tube Daily  . fentaNYL      . glucagon (human recombinant)      . lidocaine      . mouth rinse  15 mL Mouth Rinse q12n4p  . metoprolol tartrate  75 mg Per Tube BID  . midazolam      . mycophenolate  1,000 mg Per Tube BID  . pantoprazole sodium  40 mg Per Tube Daily  . potassium chloride  30 mEq Per Tube Daily  . predniSONE  20 mg Per Tube BID  . sodium chloride flush  3 mL Intravenous Q12H   Continuous Infusions: .  dextrose 50 mL/hr at 09/02/17 1326  . feeding supplement (OSMOLITE 1.2 CAL) Stopped (09/01/17 2355)  . potassium chloride 10 mEq (09/02/17 1333)    LOS: 16 days   Time spent: 35 min  Domenic Polite, MD [facility] Page via [email or web address] If 7PM-7AM, please contact night-coverage [email or web address] Password [email or web address] 09/02/2017, 2:09 PM

## 2017-09-02 NOTE — Sedation Documentation (Signed)
Patient is resting comfortably. 

## 2017-09-02 NOTE — Progress Notes (Signed)
SLP Cancellation Note  Patient Details Name: [name] [name] MRN: [id number] DOB: 01-04-1941   Cancelled treatment:        Reviewed chart; pt received PEG today. SLP planned to check in with family however not present and pt currently having EEG. Will continue efforts.   Houston Siren 09/02/2017, 3:32 PM

## 2017-09-02 NOTE — Clinical Social Work Note (Signed)
CSW continuing to follow patient progress. A PEG tube has been placed and patient will be ready for discharge soon. CSW continuing to monitor patient's progress and will facilitate discharge back to [facility] once medically stable.  [name] [name], MSW, LCSW Licensed Clinical Social Worker [place] [phone]

## 2017-09-02 NOTE — Progress Notes (Signed)
Order clarification Dr. Laurence Ferrari, V/O low continuous low wall suction until 1400 the cap.

## 2017-09-02 NOTE — Progress Notes (Signed)
Pt back from IR.  Peg tube placed.  VSS.  Respirations even and unlabored.

## 2017-09-02 NOTE — Procedures (Signed)
Date of recording 09/02/2017  Referring physician [place]  Reason for the study Altered mental status  Technical Digital EEG recording using 10-20 international electrode system  Description of the recording When awake posterior dominant rhythm is7-8Hz  symmetrical reactive Occasional generalized intermittent delta slowing,  Non REM stage II sleep was not obtained Epileptiform features were not seen during this recording  Impression The EEG isabnormalare findings are suggestive of mild generalized cerebral dysfunction.Epileptiform features were not seen during this recording.

## 2017-09-02 NOTE — Progress Notes (Signed)
Pt NPO for peg tube placement.  Pt gone down via bed to IR.  Consent was obtained yesterday by phone.

## 2017-09-02 NOTE — Progress Notes (Signed)
CBG re-check 92

## 2017-09-02 NOTE — Progress Notes (Signed)
Pt cbg 61 gave 25 ml D50 paged Dr. [name]. Will re-check.

## 2017-09-02 NOTE — Progress Notes (Signed)
Cortrak Tube Team Note:  Consult received regarding clogged Cortrak tube.   Pt received G-tube today and Cortrak tube has been removed. Cortrak tube consult discontinued as Cortrak Team services no longer needed  [place], [place], [place], [place] [phone] Pager  [phone] Weekend/On-Call Pager

## 2017-09-02 NOTE — Progress Notes (Signed)
Phone call to floor RN to let her know that we will need stat labs on pt before we are able to bring down to IR. She verbalized understanding and states pt son will be in at 0800 to sign consent for gastric tube placement.

## 2017-09-03 LAB — CBC
HEMATOCRIT: 32.1 % — AB (ref 36.0–46.0)
Hemoglobin: 10 g/dL — ABNORMAL LOW (ref 12.0–15.0)
MCH: 26.8 pg (ref 26.0–34.0)
MCHC: 31.2 g/dL (ref 30.0–36.0)
MCV: 86.1 fL (ref 78.0–100.0)
Platelets: 317 10*3/uL (ref 150–400)
RBC: 3.73 MIL/uL — AB (ref 3.87–5.11)
RDW: 18.5 % — AB (ref 11.5–15.5)
WBC: 11.4 10*3/uL — AB (ref 4.0–10.5)

## 2017-09-03 LAB — BASIC METABOLIC PANEL
ANION GAP: 7 (ref 5–15)
BUN: 12 mg/dL (ref 8–23)
CHLORIDE: 104 mmol/L (ref 98–111)
CO2: 27 mmol/L (ref 22–32)
Calcium: 8.1 mg/dL — ABNORMAL LOW (ref 8.9–10.3)
Creatinine, Ser: 0.39 mg/dL — ABNORMAL LOW (ref 0.44–1.00)
GFR calc Af Amer: >60 mL/min (ref >60)
GFR calc non Af Amer: >60 mL/min (ref >60)
GLUCOSE: 86 mg/dL (ref 70–99)
POTASSIUM: 3.7 mmol/L (ref 3.5–5.1)
Sodium: 138 mmol/L (ref 135–145)

## 2017-09-03 LAB — GLUCOSE, CAPILLARY
GLUCOSE-CAPILLARY: 104 mg/dL — AB (ref 70–99)
GLUCOSE-CAPILLARY: 113 mg/dL — AB (ref 70–99)
Glucose-Capillary: 91 mg/dL (ref 70–99)
Glucose-Capillary: 79 mg/dL (ref 70–99)

## 2017-09-03 MED ORDER — FREE WATER: 250.0000 mL | Freq: Three times a day (TID) | Status: DC

## 2017-09-03 MED ORDER — OSMOLITE 1.5 CAL PO LIQD
237.0000 mL | Freq: Four times a day (QID) | ORAL | Status: DC
  Administered 2017-09-03 – 2017-09-09 (×25): 237 mL
  Filled 2017-09-03: qty 1000
  Filled 2017-09-03 (×2): qty 237
  Filled 2017-09-03 (×6): qty 1000
  Filled 2017-09-03 (×3): qty 237
  Filled 2017-09-03 (×5): qty 1000
  Filled 2017-09-03: qty 237
  Filled 2017-09-03 (×2): qty 1000
  Filled 2017-09-03 (×3): qty 237
  Filled 2017-09-03: qty 1000
  Filled 2017-09-03 (×2): qty 237
  Filled 2017-09-03: qty 1000
  Filled 2017-09-03: qty 237
  Filled 2017-09-03 (×2): qty 1000

## 2017-09-03 MED ORDER — PREDNISONE 5 MG/ML PO CONC
20.0000 mg | Freq: Two times a day (BID) | ORAL | Status: DC
  Administered 2017-09-04 – 2017-09-09 (×11): 20 mg
  Filled 2017-09-03 (×13): qty 4

## 2017-09-03 MED ORDER — PRO-STAT SUGAR FREE PO LIQD
30.0000 mL | Freq: Two times a day (BID) | ORAL | Status: DC
  Administered 2017-09-03 – 2017-09-09 (×14): 30 mL
  Filled 2017-09-03 (×14): qty 30

## 2017-09-03 MED ORDER — FREE WATER
200.0000 mL | Freq: Three times a day (TID) | Status: DC
  Administered 2017-09-03 – 2017-09-06 (×10): 200 mL

## 2017-09-03 NOTE — Progress Notes (Signed)
Physical Therapy Treatment Patient Details Name: [name] [name] MRN: [id number] DOB: October 17, 1940 Today's Date: 09/03/2017    History of Present Illness 77 y.o. female admitted on 08/17/17 for rectal bleeding.  Pt dx with GIB with hematochezia (s/p EGD with no signs of UGIB s/p 2 units PRBCs), syncope related to possible hemorrhagic shock vs PTX vs sepsis requiring intubation 08/17/17-08/20/17.  PTX treated with L chest tube 8/5-08/21/17.  Pt with other significant PMH of polymyositis, CVA, chronic diastolic heart failure, bil RTC repair, and lumbar laminectomy.    PT Comments    Pt had PEG tube placed yesterday, 8/21. Tube feeds to begin today. Pt required +2 max assist supine to sit and squat pivot transfer bed to recliner. Pt positioned in recliner with feet elevated at end of session. Goals reviewed and updated. PT to continue per POC.    Follow Up Recommendations  SNF     Equipment Recommendations  Hospital bed;Wheelchair cushion (measurements PT);Wheelchair (measurements PT);Other (comment)    Recommendations for Other Services       Precautions / Restrictions Precautions Precautions: Fall Precaution Comments: right side weaker than left    Mobility  Bed Mobility Overal bed mobility: Needs Assistance       Supine to sit: +2 for physical assistance;Max assist;HOB elevated     General bed mobility comments: minimal initiation  Transfers Overall transfer level: Needs assistance   Transfers: Squat Pivot Transfers     Squat pivot transfers: Max assist;+2 physical assistance     General transfer comment: pivot transfer bed to recliner toward left. Knees blocked for stability. Use of bed pads under bottom for stability during pivot.  Ambulation/Gait             General Gait Details: unable   Stairs             Wheelchair Mobility    Modified Rankin (Stroke Patients Only)       Balance Overall balance assessment: Needs assistance Sitting-balance  support: Feet supported;Single extremity supported   Sitting balance - Comments: LUE placed in lap due to pushing toward the right, sat EOB x 4 minutes min guard to mod assist                                    Cognition Arousal/Alertness: Awake/alert Behavior During Therapy: Flat affect Overall Cognitive Status: Impaired/Different from baseline Area of Impairment: Following commands;Attention;Problem solving                 Orientation Level: Disoriented to;Place;Time;Situation Current Attention Level: Focused Memory: Decreased recall of precautions;Decreased short-term memory Following Commands: Follows one step commands inconsistently Safety/Judgement: Decreased awareness of safety;Decreased awareness of deficits Awareness: Intellectual Problem Solving: Slow processing;Decreased initiation;Difficulty sequencing;Requires tactile cues;Requires verbal cues General Comments: Turns head to respond to her name; briefly makes eye contact      Exercises      General Comments        Pertinent Vitals/Pain Pain Assessment: Faces Faces Pain Scale: Hurts a little bit Pain Location: generalized with movement,  restless, moaning Pain Descriptors / Indicators: Grimacing;Moaning Pain Intervention(s): Monitored during session;Repositioned    Home Living                      Prior Function            PT Goals (current goals can now be found in the care plan section) Acute  Rehab PT Goals Patient Stated Goal: Unable to state PT Goal Formulation: Patient unable to participate in goal setting Time For Goal Achievement: 09/17/17 Potential to Achieve Goals: Fair Progress towards PT goals: Goals met and updated - see care plan    Frequency    Min 2X/week      PT Plan Current plan remains appropriate    Co-evaluation              AM-PAC PT "6 Clicks" Daily Activity  Outcome Measure  Difficulty turning over in bed (including adjusting  bedclothes, sheets and blankets)?: Unable Difficulty moving from lying on back to sitting on the side of the bed? : Unable Difficulty sitting down on and standing up from a chair with arms (e.g., wheelchair, bedside commode, etc,.)?: Unable Help needed moving to and from a bed to chair (including a wheelchair)?: A Lot Help needed walking in hospital room?: Total Help needed climbing 3-5 steps with a railing? : Total 6 Click Score: 7    End of Session Equipment Utilized During Treatment: Gait belt Activity Tolerance: Patient tolerated treatment well Patient left: in chair;with call bell/phone within reach;with chair alarm set;with family/visitor present Nurse Communication: Mobility status;Need for lift equipment PT Visit Diagnosis: Muscle weakness (generalized) (M62.81);Difficulty in walking, not elsewhere classified (R26.2);Other symptoms and signs involving the nervous system (R29.898)     Time: 3601-6580 PT Time Calculation (min) (ACUTE ONLY): 16 min  Charges:  $Therapeutic Activity: 8-22 mins                     Lorrin Goodell, PT  Office # [phone] Pager [phone]    Lorriane Shire 09/03/2017, 11:01 AM

## 2017-09-03 NOTE — Progress Notes (Signed)
Supervising Physician: Markus Daft  Patient Status:  [facility] - In-pt  Chief Complaint: Dysphagia  Subjective: Patient makes no attempt to communicate. Readjusted in bed.   Allergies: Codeine; Hydrocodone; and Tramadol hcl  Medications: Prior to Admission medications   Medication Sig Start Date End Date Taking? Authorizing Provider  acetaminophen (TYLENOL 8 HOUR) 650 MG CR tablet Take 1 tablet (650 mg total) by mouth every 8 (eight) hours as needed. Patient taking differently: Take 650 mg by mouth every 8 (eight) hours as needed for pain.  05/15/17  Yes Varney Biles, MD  allopurinol (ZYLOPRIM) 100 MG tablet Take 1 tablet (100 mg total) by mouth daily. 07/23/17 08/22/17 Yes Florencia Reasons, MD  aspirin 81 MG chewable tablet Chew 1 tablet (81 mg total) by mouth daily. 06/12/17  Yes Caren Griffins, MD  atenolol (TENORMIN) 50 MG tablet Take 50 mg by mouth daily.     Yes [provider]  Calcium Carbonate (CALCIUM 600 PO) Take 1,200 mg by mouth daily.   Yes [provider]  clopidogrel (PLAVIX) 75 MG tablet Take 75 mg by mouth daily.   Yes [provider]  ferrous sulfate 325 (65 FE) MG tablet Take 325 mg by mouth daily with breakfast.   Yes [provider]  fluticasone (FLONASE) 50 MCG/ACT nasal spray Place 1 spray into both nostrils daily as needed for allergies.  06/05/17  Yes [provider]  folic acid (FOLVITE) 454 MCG tablet Take 400 mcg by mouth daily.     Yes [provider]  furosemide (LASIX) 40 MG tablet Take 1 tablet (40 mg total) by mouth every Monday, Wednesday, and Friday. 07/24/17  Yes Florencia Reasons, MD  gabapentin (NEURONTIN) 100 MG capsule TAKE ONE CAPSULE BY MOUTH TWICE DAILY 04/14/17  Yes Regal, Tamala Fothergill, DPM  mycophenolate (CELLCEPT) 250 MG capsule Take 4 capsules (1,000 mg total) by mouth 2 (two) times daily. 07/23/17  Yes Florencia Reasons, MD  omega-3 acid ethyl esters (LOVAZA) 1 g capsule Take 1 capsule (1 g total) by mouth 2 (two)  times daily. 07/23/17  Yes Florencia Reasons, MD  oxyCODONE (OXY IR/ROXICODONE) 5 MG immediate release tablet Take 5 mg by mouth 2 (two) times daily as needed for severe pain.   Yes [provider]  pantoprazole (PROTONIX) 40 MG tablet Take 1 tablet (40 mg total) by mouth daily. Patient taking differently: Take 40 mg by mouth daily. Do not crush 06/25/17  Yes Love, Ivan Anchors, PA-C  potassium chloride SA (K-DUR,KLOR-CON) 20 MEQ tablet Take 2 tablets (40 mEq total) by mouth daily. 06/25/17  Yes Love, Ivan Anchors, PA-C  senna-docusate (SENOKOT-S) 8.6-50 MG tablet Take 2 tablets by mouth at bedtime as needed for mild constipation. 06/24/17  Yes Love, Ivan Anchors, PA-C  vitamin B-12 (CYANOCOBALAMIN) 1000 MCG tablet Take 1,000 mcg by mouth daily.     Yes [provider]  vitamin C (ASCORBIC ACID) 500 MG tablet Take 1,000 mg by mouth daily.    Yes [provider]     Vital Signs: BP 125/73 (BP Location: Left Arm)   Pulse 95   Temp 98.1 F (36.7 C) (Oral)   Resp 18   Ht 5\' 2"  (1.575 m)   Wt 145 lb 15.1 oz (66.2 kg)   SpO2 92%   BMI 26.69 kg/m   Physical Exam  NAD, alert but non-communicative Abdomen: soft, non-distended.  Gastrostomy in place.  Insertion site c/d/i.   Imaging: Dg Abd 1 View  Result  Date: 09/01/2017 CLINICAL DATA:  Evaluate feeding tube EXAM: ABDOMEN - 1 VIEW COMPARISON:  08/23/2017 FINDINGS: Single fluoroscopic image of the abdomen. According to study nodes, feeding tube with unclogged in radiology by guidewire. Total fluoroscopy time was 24 seconds. Subsegmental atelectasis at the right base. Probable tiny left effusion. Tip of the feeding tube projects over the distal stomach. IMPRESSION: Tip of feeding tube projects over the distal stomach Electronically Signed   By: Donavan Foil M.D.   On: 09/01/2017 19:59   Ct Head Wo Contrast  Result Date: 09/02/2017 CLINICAL DATA:  Encephalopathy EXAM: CT HEAD WITHOUT CONTRAST TECHNIQUE: Contiguous axial images were obtained  from the base of the skull through the vertex without intravenous contrast. COMPARISON:  08/19/2017 FINDINGS: Brain: No findings to suggest acute hemorrhage, acute infarction or space-occupying mass lesion are noted. Marked bilateral chronic white matter ischemic change is noted stable from the prior exam. Vascular: No hyperdense vessel or unexpected calcification. Skull: Normal. Negative for fracture or focal lesion. Sinuses/Orbits: No acute finding. Other: None. IMPRESSION: Chronic white matter ischemic changes and atrophic changes stable from the prior exam. No acute abnormality is noted. Electronically Signed   By: Inez Catalina M.D.   On: 09/02/2017 19:45   Ir Gastrostomy Tube Mod Sed  Result Date: 09/02/2017 INDICATION: 77 year old female with dysphagia. She is currently taking enteral nutrition via a transpyloric feeding tube and presents for placement of a more durable percutaneous gastrostomy tube. EXAM: Fluoroscopically guided placement of percutaneous pull-through gastrostomy tube Interventional Radiologist:  Criselda Peaches, MD MEDICATIONS: 2 g Ancef; Antibiotics were administered within 1 hour of the procedure. ANESTHESIA/SEDATION: Versed 2 mg IV; Fentanyl 50 mcg IV Moderate Sedation Time:  10 minutes The patient was continuously monitored during the procedure by the interventional radiology nurse under my direct supervision. CONTRAST:  15 mL Isovue-300 FLUOROSCOPY TIME:  Fluoroscopy Time: 1 minutes 54 seconds (7 mGy). COMPLICATIONS: None immediate. PROCEDURE: Informed written consent was obtained from the patient after a thorough discussion of the procedural risks, benefits and alternatives. All questions were addressed. Maximal Sterile Barrier Technique was utilized including caps, mask, sterile gowns, sterile gloves, sterile drape, hand hygiene and skin antiseptic. A timeout was performed prior to the initiation of the procedure. Maximal barrier sterile technique utilized including caps, mask,  sterile gowns, sterile gloves, large sterile drape, hand hygiene, and chlorhexadine skin prep. An angled catheter was advanced over a wire under fluoroscopic guidance through the nose, down the esophagus and into the body of the stomach. The stomach was then insufflated with several 100 ml of air. Fluoroscopy confirmed location of the gastric bubble, as well as inferior displacement of the barium stained colon. Under direct fluoroscopic guidance, a single T-tack was placed, and the anterior gastric wall drawn up against the anterior abdominal wall. Percutaneous access was then obtained into the mid gastric body with an 18 gauge sheath needle. Aspiration of air, and injection of contrast material under fluoroscopy confirmed needle placement. An Amplatz wire was advanced in the gastric body and the access needle exchanged for a 9-French vascular sheath. A snare device was advanced through the vascular sheath and an Amplatz wire advanced through the angled catheter. The Amplatz wire was successfully snared and this was pulled up through the esophagus and out the mouth. A 20-French Alinda Dooms MIC-PEG tube was then connected to the snare and pulled through the mouth, down the esophagus, into the stomach and out to the anterior abdominal wall. Hand injection of contrast material confirmed intragastric location.  The T-tack retention suture was then cut. The pull through peg tube was then secured with the external bumper and capped. The patient will be observed for several hours with the newly placed tube on low wall suction to evaluate for any post procedure complication. The patient tolerated the procedure well, there is no immediate complication. IMPRESSION: Successful placement of a 20 French pull through gastrostomy tube. Electronically Signed   By: Jacqulynn Cadet M.D.   On: 09/02/2017 10:24    Labs:  CBC: Recent Labs    08/30/17 0313 09/01/17 0323 09/02/17 0657 09/03/17 0323  WBC 12.4* 17.2* 9.7  11.4*  HGB 10.4* 10.3* 10.6* 10.0*  HCT 33.0* 33.4* 34.9* 32.1*  PLT 333 335 357 317    COAGS: Recent Labs    06/09/17 1648 08/17/17 0638 08/18/17 0416 09/02/17 0657  INR 1.01 0.94 1.13 0.97  APTT 95*  --   --   --     BMP: Recent Labs    08/30/17 0313 09/01/17 0323 09/02/17 1157 09/03/17 0323  NA 141 145 142 138  K 3.8 3.4* 3.0* 3.7  CL 104 104 107 104  CO2 28 26 26 27   GLUCOSE 144* 139* 179* 86  BUN 20 23 19 12   CALCIUM 8.6* 8.9 8.0* 8.1*  CREATININE 0.45 0.54 0.44 0.39*  GFRNONAA >60 >60 >60 >60  GFRAA >60 >60 >60 >60    LIVER FUNCTION TESTS: Recent Labs    08/17/17 0638 08/25/17 0414 08/26/17 0340 09/02/17 1157  BILITOT 0.8 0.8 0.6 0.7  AST 16 28 29 27   ALT 18 25 26  36  ALKPHOS 43 42 53 50  PROT 5.7* 4.6* 5.2* 4.5*  ALBUMIN 3.2* 2.5* 2.7* 2.3*    Assessment and Plan: Dysphagia Patient s/p gastrostomy tube placement yesterday.  Site assessed today.  Intact, clean, dry.  No evidence of erythema, warmth, or oozing.  G-tube ready for use.  RN aware.  Electronically Signed: Docia Barrier, PA 09/03/2017, 11:27 AM   I spent a total of 15 Minutes at the the patient's bedside AND on the patient's hospital floor or unit, greater than 50% of which was counseling/coordinating care for dysphagia.

## 2017-09-03 NOTE — Progress Notes (Signed)
Nutrition Follow-up  DOCUMENTATION CODES:   Not applicable  INTERVENTION:   Tube Feeding:  Change modality to bolus feeding as this is more physiologic Osmolite 1.5 237 mL 4 times daily Pro-Stat 30 mL BID Provides 1620 kcals, 89 g of protein and 724 mL of free water  Plan for free water flushes of 50 mL before and after each bolus feeding.  Recommend additional free water of 200 TID. Provides total of 1754 mL of free water. Does not include water flushes for meds   NUTRITION DIAGNOSIS:   Inadequate oral intake related to acute illness as evidenced by NPO status.  Being addressed via TF and G-tube  GOAL:   Patient will meet greater than or equal to 90% of their needs   MONITOR:   TF tolerance, Labs, Weight trends, Skin  REASON FOR ASSESSMENT:   Ventilator    ASSESSMENT:   77 yo female admitted with acute GI bleed, encephalopathy, pneumothorax s/p chest tube placement. Pt with hx of CVA, polymyositis, gastric leiomyoma, HTN, GERD, CHF. At baseline, pt is total care, wheelchair bound, chronic UE muscle weakness related to polymyositis. Pt is unable to feed self.   8/05 Admit, Intubated 8/08 Extubated 8/10 Palliative Care Consulted 8/11 NG tube placement attempted but unsuccessful 8/12 Cortrak placed for nutrition 8/15 EGD negative for bleeding 8/21 IR placed G-tube  Pt has failed multiple swallowing studies. Family agreed for G-tube placement for nutrition.  Pt previously tolerating Osmolite 1.2 @ 55 ml/hr with Pro-Stat 30 mL daily via Cortrak tube. Cortrak tube yesterday.   Weight is relatively stable since admission; current wt 66.2 kg  Pt started on D5 yesterday for hypoglycemia; hopefully will resolve with re-initiation of feedings and D5 can be stopped  Labs: reviewed Meds: D5 at 50 ml/hr, prednisone, KCl   Diet Order:   Diet Order            Diet NPO time specified Except for: Ice Chips  Diet effective now              EDUCATION NEEDS:   Not  appropriate for education at this time  Skin:  Skin Assessment: Skin Integrity Issues:(no pressure ulcers noted) Skin Integrity Issues:: Other (Comment) Other: MASD: buttock  Last BM:  8/19  Height:   Ht Readings from Last 1 Encounters:  08/17/17 5\' 2"  (1.575 m)    Weight:   Wt Readings from Last 1 Encounters:  08/30/17 66.2 kg    Ideal Body Weight:     BMI:  Body mass index is 26.69 kg/m.  Estimated Nutritional Needs:   Kcal:  1600-1800 kcals   Protein:  80-90 g  Fluid:  >/= 1.6 L   Kerman Passey MS, RD, LDN, CNSC [phone] Pager  [phone] Weekend/On-Call Pager

## 2017-09-03 NOTE — Progress Notes (Signed)
PROGRESS NOTE    [name] [name]  BWI:[id number] DOB: 1940-06-19 DOA: 08/17/2017 PCP: [name], MD    Brief Narrative:  77yo female SNF resident with a hx of polymyositis, CVA with complete right hemiparesis, contracted, gastric leiomyoma, hypertension, GERD, and diastolic CHF who was brought to the ED w/ bright red blood per rectum. She had episodes of hypotension and unresponsiveness in the ED and required intubation. She was transfused 2 unit of PRBC transfusion.  She was extubated on 8/8.  subsequently transferred from ICU to hospitalist service. -Gastroenterology was consulted, underwent EGD.  CT abdomen and pelvis without any acute abnormality.  Aspirin Plavix have been on hold.  Considering patient being contracted, no further endoscopic work-up is planned.  Patient had few other episodes of bloody stools, however improved.  Patient failed swallow evaluation multiple times.  Dobbhoff tube is placed.  s/p palliative care meeting on 08/30/2017 for further decide about goals of care.  pt is DNR.  -family decided to pursue PEG tube, IR consulted, status post PEG 8/21   Assessment & Plan:   Hemorrhagic shock: -resolved -Suspected to be secondary to diverticular bleed versus recurrent colonic AVM -Gastroenterology was consulted, EGD on 8/15 by Dr. Lyndel Safe negative for upper GI source of bleeding, due to overall very poor prognosis and contractures no plans to pursue colonoscopic evaluation, no significant ongoing GI bleed at this time -CT abdomen and pelvis was negative for etiology of acute bleed as well -Aspirin and Plavix are on hold  Acute respiratory failure with hypoxia:  -patient was unresponsive in the emergency room in the setting of hemorrhagic shock  -Intubated 8/5 -Also treated for aspiration pneumonia, noted to have worsening dysphagia -She was extubated on 8/9 -Completed antibiotic course -Respiratory failure has resolved weaned down to room air now, however continues to  be high aspiration risk  Encephalopathy with Dysphagia: -Secondary to hemorrhagic shock with hypoperfusion, aspiration pneumonia and prior CVA with atrophy -prior to this admission was reportedly interactive, able to carry conversation, following extubation has been noted to be nonverbal, and had worsening dysphagia -She was seen by neurology in the ED on admission, underwent CTA head and neck-this did not show any evidence of large vessel occlusion - Has failed numerous swallow studies, she had a cortrak placed 8/12 after multiple attempts, started tube feeds - Seen by palliative medicine, status post goals of care meeting, now DO NOT RESUSCITATE however family wanted to pursue PEG tube placement despite multiple discussions on poor prognosis -PEG tube placed in interventional radiology on 8/21, plan to start tube feeds slowly today, RD consulting -In terms of her encephalopathy has stopped all sedating medications, EEG was negative for epileptiform discharges and repeat CT head 8/21 without any acute findings  H/o CVA  -aspirin and Plavix held due to hemorrhagic shock and severe recurrent lower GI bleed  Hypertension: -continue metoprolol to 75 mg BID,  amlodipine 10 mg daily.    H/o polymyositis:  -unable to walk, wheelchair/bed bound at baseline long before her stroke secondary to severe polymyositis, also has chronic adrenal insufficiency.  -continue cept and prednisone.   Deconditioning.  Due to CVA AND Polymyositis.  Bed bound with contractures on lower extremities.  Will need SNF on discharge.   Ethics: this is a chronically ill female, with worsening cognition and mental status, recent stroke, worsening dysphagia, ongoing failure to thrive -Seen by palliative medicine, prognosis remains poor -DNR, back to SNF with palliative care   DVT prophylaxis:SCDs Code Status:DNR Family Communication: discussed  with daughter, son and extended family at bedside Disposition Plan: SNF  when mentation a little better, tolerating tube feeds  Consultants:   Gastroenterology   Palliative care.   PCCM  SLP  Procedures: EGD   Antimicrobials:none.   Subjective: -opens eyes to verbal commands moans and makes a few groaning sounds  Objective: Vitals:   09/02/17 1628 09/02/17 2054 09/03/17 0426 09/03/17 0904  BP: (!) 151/80 125/90 134/88 125/73  Pulse: (!) 108 (!) 109 (!) 123 95  Resp: 15 20 18 18   Temp: 98.2 F (36.8 C) 98.2 F (36.8 C) 98.8 F (37.1 C) 98.1 F (36.7 C)  TempSrc: Oral Oral Oral Oral  SpO2: (!) 77% 100% 98% 92%  Weight:      Height:        Intake/Output Summary (Last 24 hours) at 09/03/2017 1247 Last data filed at 09/03/2017 0900 Gross per 24 hour  Intake 0 ml  Output 600 ml  Net -600 ml   Filed Weights   08/26/17 0500 08/27/17 2012 08/30/17 0609  Weight: 65 kg 65.1 kg 66.2 kg   Examination: Gen: elderly, debilitated, chronically ill female, moaning and groaning, more alert, tracks HEENT: neck supple, no JVD Lungs: Good air movement bilaterally, CTAB CVS: RRR,No Gallops,Rubs or new Murmurs Abd: soft, Non tender, non distended, BS present, G-tube noted Extremities: No edema, right-sided contractures Skin: no new rashes  Data Reviewed: I have personally reviewed following labs and imaging studies  CBC: Recent Labs  Lab 08/29/17 0849 08/30/17 0313 09/01/17 0323 09/02/17 0657 09/03/17 0323  WBC 19.8* 12.4* 17.2* 9.7 11.4*  HGB 9.9* 10.4* 10.3* 10.6* 10.0*  HCT 31.7* 33.0* 33.4* 34.9* 32.1*  MCV 84.8 84.8 86.5 88.1 86.1  PLT 310 333 335 357 268   Basic Metabolic Panel: Recent Labs  Lab 08/28/17 1134 08/30/17 0313 09/01/17 0323 09/02/17 1157 09/03/17 0323  NA 140 141 145 142 138  K 4.7 3.8 3.4* 3.0* 3.7  CL 105 104 104 107 104  CO2 27 28 26 26 27   GLUCOSE 104* 144* 139* 179* 86  BUN 13 20 23 19 12   CREATININE 0.43* 0.45 0.54 0.44 0.39*  CALCIUM 8.7* 8.6* 8.9 8.0* 8.1*  MG 2.1  --   --  2.1  --     GFR: Estimated Creatinine Clearance: 52.5 mL/min (A) (by C-G formula based on SCr of 0.39 mg/dL (L)). Liver Function Tests: Recent Labs  Lab 09/02/17 1157  AST 27  ALT 36  ALKPHOS 50  BILITOT 0.7  PROT 4.5*  ALBUMIN 2.3*   No results for input(s): LIPASE, AMYLASE in the last 168 hours. No results for input(s): AMMONIA in the last 168 hours. Coagulation Profile: Recent Labs  Lab 09/02/17 0657  INR 0.97   Cardiac Enzymes: No results for input(s): CKTOTAL, CKMB, CKMBINDEX, TROPONINI in the last 168 hours. BNP (last 3 results) No results for input(s): PROBNP in the last 8760 hours. HbA1C: No results for input(s): HGBA1C in the last 72 hours. CBG: Recent Labs  Lab 09/02/17 2207 09/02/17 2343 09/03/17 0424 09/03/17 0742 09/03/17 1158  GLUCAP 88 93 91 79 104*   Lipid Profile: No results for input(s): CHOL, HDL, LDLCALC, TRIG, CHOLHDL, LDLDIRECT in the last 72 hours. Thyroid Function Tests: No results for input(s): TSH, T4TOTAL, FREET4, T3FREE, THYROIDAB in the last 72 hours. Anemia Panel: No results for input(s): VITAMINB12, FOLATE, FERRITIN, TIBC, IRON, RETICCTPCT in the last 72 hours. Sepsis Labs: No results for input(s): PROCALCITON, LATICACIDVEN in the last 168 hours.  No results found for this or any previous visit (from the past 240 hour(s)).   Radiology Studies: Dg Abd 1 View  Result Date: 09/01/2017 CLINICAL DATA:  Evaluate feeding tube EXAM: ABDOMEN - 1 VIEW COMPARISON:  08/23/2017 FINDINGS: Single fluoroscopic image of the abdomen. According to study nodes, feeding tube with unclogged in radiology by guidewire. Total fluoroscopy time was 24 seconds. Subsegmental atelectasis at the right base. Probable tiny left effusion. Tip of the feeding tube projects over the distal stomach. IMPRESSION: Tip of feeding tube projects over the distal stomach Electronically Signed   By: Donavan Foil M.D.   On: 09/01/2017 19:59   Ct Head Wo Contrast  Result Date:  09/02/2017 CLINICAL DATA:  Encephalopathy EXAM: CT HEAD WITHOUT CONTRAST TECHNIQUE: Contiguous axial images were obtained from the base of the skull through the vertex without intravenous contrast. COMPARISON:  08/19/2017 FINDINGS: Brain: No findings to suggest acute hemorrhage, acute infarction or space-occupying mass lesion are noted. Marked bilateral chronic white matter ischemic change is noted stable from the prior exam. Vascular: No hyperdense vessel or unexpected calcification. Skull: Normal. Negative for fracture or focal lesion. Sinuses/Orbits: No acute finding. Other: None. IMPRESSION: Chronic white matter ischemic changes and atrophic changes stable from the prior exam. No acute abnormality is noted. Electronically Signed   By: Inez Catalina M.D.   On: 09/02/2017 19:45   Ir Gastrostomy Tube Mod Sed  Result Date: 09/02/2017 INDICATION: 77 year old female with dysphagia. She is currently taking enteral nutrition via a transpyloric feeding tube and presents for placement of a more durable percutaneous gastrostomy tube. EXAM: Fluoroscopically guided placement of percutaneous pull-through gastrostomy tube Interventional Radiologist:  Criselda Peaches, MD MEDICATIONS: 2 g Ancef; Antibiotics were administered within 1 hour of the procedure. ANESTHESIA/SEDATION: Versed 2 mg IV; Fentanyl 50 mcg IV Moderate Sedation Time:  10 minutes The patient was continuously monitored during the procedure by the interventional radiology nurse under my direct supervision. CONTRAST:  15 mL Isovue-300 FLUOROSCOPY TIME:  Fluoroscopy Time: 1 minutes 54 seconds (7 mGy). COMPLICATIONS: None immediate. PROCEDURE: Informed written consent was obtained from the patient after a thorough discussion of the procedural risks, benefits and alternatives. All questions were addressed. Maximal Sterile Barrier Technique was utilized including caps, mask, sterile gowns, sterile gloves, sterile drape, hand hygiene and skin antiseptic. A  timeout was performed prior to the initiation of the procedure. Maximal barrier sterile technique utilized including caps, mask, sterile gowns, sterile gloves, large sterile drape, hand hygiene, and chlorhexadine skin prep. An angled catheter was advanced over a wire under fluoroscopic guidance through the nose, down the esophagus and into the body of the stomach. The stomach was then insufflated with several 100 ml of air. Fluoroscopy confirmed location of the gastric bubble, as well as inferior displacement of the barium stained colon. Under direct fluoroscopic guidance, a single T-tack was placed, and the anterior gastric wall drawn up against the anterior abdominal wall. Percutaneous access was then obtained into the mid gastric body with an 18 gauge sheath needle. Aspiration of air, and injection of contrast material under fluoroscopy confirmed needle placement. An Amplatz wire was advanced in the gastric body and the access needle exchanged for a 9-French vascular sheath. A snare device was advanced through the vascular sheath and an Amplatz wire advanced through the angled catheter. The Amplatz wire was successfully snared and this was pulled up through the esophagus and out the mouth. A 20-French Alinda Dooms MIC-PEG tube was then connected to the snare and pulled  through the mouth, down the esophagus, into the stomach and out to the anterior abdominal wall. Hand injection of contrast material confirmed intragastric location. The T-tack retention suture was then cut. The pull through peg tube was then secured with the external bumper and capped. The patient will be observed for several hours with the newly placed tube on low wall suction to evaluate for any post procedure complication. The patient tolerated the procedure well, there is no immediate complication. IMPRESSION: Successful placement of a 20 French pull through gastrostomy tube. Electronically Signed   By: Jacqulynn Cadet M.D.   On: 09/02/2017  10:24   Scheduled Meds: . amLODipine  10 mg Oral Daily  . chlorhexidine  15 mL Mouth Rinse BID  . Chlorhexidine Gluconate Cloth  6 each Topical Daily  . feeding supplement (OSMOLITE 1.5 CAL)  237 mL Per Tube QID  . feeding supplement (PRO-STAT SUGAR FREE 64)  30 mL Per Tube BID  . free water  200 mL Per Tube Q8H  . mouth rinse  15 mL Mouth Rinse q12n4p  . metoprolol tartrate  75 mg Per Tube BID  . mycophenolate  1,000 mg Per Tube BID  . pantoprazole sodium  40 mg Per Tube Daily  . potassium chloride  30 mEq Per Tube Daily  . predniSONE  20 mg Per Tube BID  . sodium chloride flush  3 mL Intravenous Q12H   Continuous Infusions: . dextrose 50 mL/hr at 09/03/17 1001    LOS: 17 days   Time spent: 35 min  Domenic Polite, MD [facility] Page via [email or web address] If 7PM-7AM, please contact night-coverage [email or web address] Password [email or web address] 09/03/2017, 12:47 PM

## 2017-09-03 NOTE — Progress Notes (Signed)
  Speech Language Pathology Treatment:    Patient Details Name: [name] [name] MRN: [id number] DOB: 03-04-1940 Today's Date: 09/03/2017 Time: 7510-2585 SLP Time Calculation (min) (ACUTE ONLY): 31 min  Assessment / Plan / Recommendation Clinical Impression  Upon arrival, patient was lethargic while sitting upright in the recliner with constant moaning. Patient's family present and reported they suspect moaning is due to restlessness from lack of sleep. Patient's head was resting in hyperextension, therefore, SLP repositioned patient to maximize safety with oral care and trials. Patient had poor management of secretions with a wet vocal quality she was unable to clear violitionaly with cues. However, after thorough oral care (multiple attempts and Max cues) patient spontaneously initiated a swallow and cleared secretions. SLP administered limited trials of ice chips due to poor acceptance and severely impaired labial seal around spoon. However, with Max A verbal and tactile cues utilized to help keep patient's oral cavity closed and more than a reasonable amount of time, patient demonstrated slight oral manipulation and initiated 2 delayed swallow responses with a clear vocal quality. Recommend patient remain NPO with continued trials from SLP only. Educated patient's family in regards to the importance of oral care and treatment plan. All verbalized understanding.  Patient left upright in recliner with family present. Continue with current plan of care.    HPI HPI: Pt is a 15yoF with hx Polymyositis, CVA, Gastric leiomyoma, HTN, GERD, and dCHF, who presented to ER with BRBPR, hypotension, and AMS. Difficult intubation 8/5, extubated 8/8. EGD showed no signs of bleeding. CXR post-intubation showed L PTX. CT Head negative. BSE 06/13/17 recommended Dys 3 diet and thin liquids, advanced to regular textures prior to d/c. MBS in May 2019 showed an overall functional swallow with a single incident of flash  penetration. Pt was noted to cough during MBS but not in the setting of airway compromise. There was also concern for a prominent CP and a possible Zencker's diverticulum developing, but no backflow was observed. Dys 3 diet and thin liquids were recommended at that time.      SLP Plan  Continue with current plan of care       Recommendations  Diet recommendations: NPO Medication Administration: Via alternative means                Oral Care Recommendations: Oral care QID Follow up Recommendations: Skilled Nursing facility;24 hour supervision/assistance SLP Visit Diagnosis: Dysphagia, oropharyngeal phase (R13.12) Plan: Continue with current plan of care       [place], [place] 09/03/2017, 2:22 PM  [place], [place], [place]

## 2017-09-04 DIAGNOSIS — J9602 Acute respiratory failure with hypercapnia: Secondary | ICD-10-CM

## 2017-09-04 LAB — CBC
HCT: 35.3 % — ABNORMAL LOW (ref 36.0–46.0)
Hemoglobin: 11.2 g/dL — ABNORMAL LOW (ref 12.0–15.0)
MCH: 27.1 pg (ref 26.0–34.0)
MCHC: 31.7 g/dL (ref 30.0–36.0)
MCV: 85.3 fL (ref 78.0–100.0)
PLATELETS: 382 10*3/uL (ref 150–400)
RBC: 4.14 MIL/uL (ref 3.87–5.11)
RDW: 17.8 % — AB (ref 11.5–15.5)
WBC: 10.5 10*3/uL (ref 4.0–10.5)

## 2017-09-04 LAB — BASIC METABOLIC PANEL
ANION GAP: 7 (ref 5–15)
BUN: 13 mg/dL (ref 8–23)
CALCIUM: 8.4 mg/dL — AB (ref 8.9–10.3)
CO2: 25 mmol/L (ref 22–32)
CREATININE: 0.36 mg/dL — AB (ref 0.44–1.00)
Chloride: 105 mmol/L (ref 98–111)
Glucose, Bld: 138 mg/dL — ABNORMAL HIGH (ref 70–99)
Potassium: 4.1 mmol/L (ref 3.5–5.1)
SODIUM: 137 mmol/L (ref 135–145)

## 2017-09-04 LAB — GLUCOSE, CAPILLARY
GLUCOSE-CAPILLARY: 154 mg/dL — AB (ref 70–99)
GLUCOSE-CAPILLARY: 193 mg/dL — AB (ref 70–99)
Glucose-Capillary: 121 mg/dL — ABNORMAL HIGH (ref 70–99)
Glucose-Capillary: 104 mg/dL — ABNORMAL HIGH (ref 70–99)
Glucose-Capillary: 127 mg/dL — ABNORMAL HIGH (ref 70–99)
Glucose-Capillary: 138 mg/dL — ABNORMAL HIGH (ref 70–99)

## 2017-09-04 NOTE — Progress Notes (Signed)
PROGRESS NOTE    [name] [name]  KZS:[id number] DOB: [birth year] DOA: 08/17/2017 PCP: [name], MD    Brief Narrative:  77yo female SNF resident with a hx of polymyositis, CVA with complete right hemiparesis, contracted, gastric leiomyoma, hypertension, GERD, and diastolic CHF who was brought to the ED w/ bright red blood per rectum. She had episodes of hypotension and unresponsiveness in the ED and required intubation. She was transfused 2 unit of PRBC transfusion.  She was extubated on 8/8.  subsequently transferred from ICU to hospitalist service. -Gastroenterology was consulted, underwent EGD.  CT abdomen and pelvis without any acute abnormality.  Aspirin Plavix have been on hold.  Considering patient being contracted, no further endoscopic work-up is planned.  Patient had few other episodes of bloody stools, however improved.  Patient failed swallow evaluation multiple times.  Dobbhoff tube is placed.  s/p palliative care meeting on 08/30/2017 for further decide about goals of care.  pt is DNR.  -family decided to pursue PEG tube, IR consulted, status post PEG 8/21   Assessment & Plan:   Hemorrhagic shock: -resolved -Suspected to be secondary to diverticular bleed versus recurrent colonic AVM -Gastroenterology was consulted, EGD on 8/15 by Dr. Lyndel Safe negative for upper GI source of bleeding, due to overall very poor prognosis and contractures no plans to pursue colonoscopic evaluation, no significant ongoing GI bleed at this time -CT abdomen and pelvis was negative for etiology of acute bleed as well -Aspirin and Plavix are on hold -no active bleeding at this time,hemoglobin is stable  Acute respiratory failure with hypoxia:  -patient was unresponsive in the emergency room in the setting of hemorrhagic shock  -Intubated 8/5, also treated for aspiration pneumonia, noted to have worsening dysphagia -She was extubated on 8/9 -Completed antibiotic course -Respiratory failure has  resolved weaned down to room air now, however continues to be high aspiration risk  Encephalopathy with Dysphagia: -Secondary to hemorrhagic shock with hypoperfusion, aspiration pneumonia and prior CVA with atrophy -prior to this admission was reportedly interactive, able to carry conversation, following extubation has been noted to be nonverbal, and had worsening dysphagia -She was seen by neurology on admission, underwent CTA head and neck-this did not show any evidence of large vessel occlusion - Has failed numerous swallow studies, she had a cortrak placed 8/12 after multiple attempts, started tube feeds - Seen by palliative medicine, status post goals of care meeting, now DO NOT RESUSCITATE however family wanted to pursue PEG tube placement despite multiple discussions on poor prognosis -PEG tube placed in interventional radiology on 8/21,started tube feeds 8/22, RD consulting -In terms of her encephalopathy has stopped all sedating medications, EEG was negative for epileptiform discharges and repeat CT head 8/21 without any acute findings -more alert now, follows some commands but moans and groans only  H/o CVA  -aspirin and Plavix held due to hemorrhagic shock and severe recurrent lower GI bleed  Hypertension: -continue metoprolol to 75 mg BID,  amlodipine 10 mg daily.    H/o polymyositis:  -unable to walk, wheelchair/bed bound at baseline long before her stroke secondary to severe polymyositis, also has chronic adrenal insufficiency.  -continue cellcept and prednisone.   Deconditioning.  Due to CVA AND Polymyositis.  Bed bound with contractures on lower extremities.  -needs SNF at discharge.   Ethics: this is a chronically ill female, with worsening cognition and mental status, recent stroke, worsening dysphagia, ongoing failure to thrive -Seen by palliative medicine, prognosis remains poor -DNR, back to SNF with  palliative care   DVT prophylaxis:SCDs Code Status:DNR Family  Communication: discussed with daughter, son and extended family at bedside Disposition Plan: SNF when mentation a little better, tolerating tube feeds  Consultants:   Gastroenterology   Palliative care.   PCCM  SLP  Procedures: EGD   Antimicrobials:none.   Subjective: -opens eyes to verbal commands moans and makes a few groaning sounds  Objective: Vitals:   09/03/17 1741 09/03/17 1939 09/03/17 1940 09/04/17 0907  BP: (!) 130/103  116/69 91/81  Pulse: (!) 120  60 92  Resp: 20   20  Temp:   97.7 F (36.5 C) 97.9 F (36.6 C)  TempSrc:  Axillary Axillary Oral  SpO2: 99%  100% 100%  Weight:   63.4 kg   Height:        Intake/Output Summary (Last 24 hours) at 09/04/2017 1302 Last data filed at 09/04/2017 1204 Gross per 24 hour  Intake 0 ml  Output 2875 ml  Net -2875 ml   Filed Weights   08/27/17 2012 08/30/17 0609 09/03/17 1940  Weight: 65.1 kg 66.2 kg 63.4 kg   Examination:  Gen: elderly, debilitated, chronically ill female, moaning and groaning, more alert, tracks HEENT: neck supple, no JVD Lungs: few scattered rhonchi, decreased breath sounds at both bases CVS: RRR,No Gallops,Rubs or new Murmurs Abd: soft, Non tender, non distended, BS present, G-tube noted Extremities: No edema, right-sided contractures Skin: no new rashes  Data Reviewed: I have personally reviewed following labs and imaging studies  CBC: Recent Labs  Lab 08/30/17 0313 09/01/17 0323 09/02/17 0657 09/03/17 0323 09/04/17 0422  WBC 12.4* 17.2* 9.7 11.4* 10.5  HGB 10.4* 10.3* 10.6* 10.0* 11.2*  HCT 33.0* 33.4* 34.9* 32.1* 35.3*  MCV 84.8 86.5 88.1 86.1 85.3  PLT 333 335 357 317 144   Basic Metabolic Panel: Recent Labs  Lab 08/30/17 0313 09/01/17 0323 09/02/17 1157 09/03/17 0323 09/04/17 0422  NA 141 145 142 138 137  K 3.8 3.4* 3.0* 3.7 4.1  CL 104 104 107 104 105  CO2 28 26 26 27 25   GLUCOSE 144* 139* 179* 86 138*  BUN 20 23 19 12 13   CREATININE 0.45 0.54 0.44 0.39* 0.36*    CALCIUM 8.6* 8.9 8.0* 8.1* 8.4*  MG  --   --  2.1  --   --    GFR: Estimated Creatinine Clearance: 51.5 mL/min (A) (by C-G formula based on SCr of 0.36 mg/dL (L)). Liver Function Tests: Recent Labs  Lab 09/02/17 1157  AST 27  ALT 36  ALKPHOS 50  BILITOT 0.7  PROT 4.5*  ALBUMIN 2.3*   No results for input(s): LIPASE, AMYLASE in the last 168 hours. No results for input(s): AMMONIA in the last 168 hours. Coagulation Profile: Recent Labs  Lab 09/02/17 0657  INR 0.97   Cardiac Enzymes: No results for input(s): CKTOTAL, CKMB, CKMBINDEX, TROPONINI in the last 168 hours. BNP (last 3 results) No results for input(s): PROBNP in the last 8760 hours. HbA1C: No results for input(s): HGBA1C in the last 72 hours. CBG: Recent Labs  Lab 09/03/17 1639 09/03/17 2005 09/04/17 0520 09/04/17 0809 09/04/17 1157  GLUCAP 113* 154* 121* 104* 127*   Lipid Profile: No results for input(s): CHOL, HDL, LDLCALC, TRIG, CHOLHDL, LDLDIRECT in the last 72 hours. Thyroid Function Tests: No results for input(s): TSH, T4TOTAL, FREET4, T3FREE, THYROIDAB in the last 72 hours. Anemia Panel: No results for input(s): VITAMINB12, FOLATE, FERRITIN, TIBC, IRON, RETICCTPCT in the last 72 hours. Sepsis Labs: No  results for input(s): PROCALCITON, LATICACIDVEN in the last 168 hours.  No results found for this or any previous visit (from the past 240 hour(s)).   Radiology Studies: Ct Head Wo Contrast  Result Date: 09/02/2017 CLINICAL DATA:  Encephalopathy EXAM: CT HEAD WITHOUT CONTRAST TECHNIQUE: Contiguous axial images were obtained from the base of the skull through the vertex without intravenous contrast. COMPARISON:  08/19/2017 FINDINGS: Brain: No findings to suggest acute hemorrhage, acute infarction or space-occupying mass lesion are noted. Marked bilateral chronic white matter ischemic change is noted stable from the prior exam. Vascular: No hyperdense vessel or unexpected calcification. Skull: Normal.  Negative for fracture or focal lesion. Sinuses/Orbits: No acute finding. Other: None. IMPRESSION: Chronic white matter ischemic changes and atrophic changes stable from the prior exam. No acute abnormality is noted. Electronically Signed   By: Inez Catalina M.D.   On: 09/02/2017 19:45   Scheduled Meds: . amLODipine  10 mg Oral Daily  . chlorhexidine  15 mL Mouth Rinse BID  . Chlorhexidine Gluconate Cloth  6 each Topical Daily  . feeding supplement (OSMOLITE 1.5 CAL)  237 mL Per Tube QID  . feeding supplement (PRO-STAT SUGAR FREE 64)  30 mL Per Tube BID  . free water  200 mL Per Tube Q8H  . mouth rinse  15 mL Mouth Rinse q12n4p  . metoprolol tartrate  75 mg Per Tube BID  . mycophenolate  1,000 mg Per Tube BID  . pantoprazole sodium  40 mg Per Tube Daily  . potassium chloride  30 mEq Per Tube Daily  . predniSONE  20 mg Per Tube Q12H  . sodium chloride flush  3 mL Intravenous Q12H   Continuous Infusions: . dextrose 50 mL/hr at 09/04/17 0510    LOS: 18 days   Time spent: 25 min  Domenic Polite, MD [facility] Page via [email or web address] If 7PM-7AM, please contact night-coverage [email or web address] Password [email or web address] 09/04/2017, 1:02 PM

## 2017-09-04 NOTE — Progress Notes (Signed)
Pt CBG 193 paged Dr. [name] per order.

## 2017-09-04 NOTE — Clinical Social Work Note (Signed)
Patient from [facility] and per MD will be ready for discharge over the weekend. The admissions director at [facility] contacted and advised and they have initiated insurance authorization as patient is [employer] Daughter Penny Pia [phone]) contacted and informed. The weekend CSW will be updated regarding weekend discharge.  Essam Lowdermilk Givens, MSW, LCSW Licensed Clinical Social Worker [place] [phone]

## 2017-09-05 LAB — GLUCOSE, CAPILLARY
GLUCOSE-CAPILLARY: 106 mg/dL — AB (ref 70–99)
GLUCOSE-CAPILLARY: 149 mg/dL — AB (ref 70–99)
Glucose-Capillary: 104 mg/dL — ABNORMAL HIGH (ref 70–99)
Glucose-Capillary: 152 mg/dL — ABNORMAL HIGH (ref 70–99)
Glucose-Capillary: 182 mg/dL — ABNORMAL HIGH (ref 70–99)
Glucose-Capillary: 65 mg/dL — ABNORMAL LOW (ref 70–99)

## 2017-09-05 MED ORDER — DIPHENOXYLATE-ATROPINE 2.5-0.025 MG/5ML PO LIQD
5.0000 mL | Freq: Two times a day (BID) | ORAL | Status: AC
  Administered 2017-09-05 (×2): 5 mL
  Filled 2017-09-05 (×2): qty 5

## 2017-09-05 NOTE — Progress Notes (Signed)
PROGRESS NOTE    [name] [name]  ZOX:[id number] DOB: [birth year] DOA: 08/17/2017 PCP: [name], MD    Brief Narrative:  77yo female SNF resident with a hx of polymyositis, CVA with complete right hemiparesis, contracted, gastric leiomyoma, hypertension, GERD, and diastolic CHF who was brought to the ED w/ bright red blood per rectum. She had episodes of hypotension and unresponsiveness in the ED and required intubation. She was transfused 2 unit of PRBC transfusion.  She was extubated on 8/8.  subsequently transferred from ICU to hospitalist service. -Gastroenterology was consulted, underwent EGD.  CT abdomen and pelvis without any acute abnormality.  Aspirin Plavix have been on hold.  Considering patient being contracted, no further endoscopic work-up is planned.  Patient had few other episodes of bloody stools, however improved.  Patient failed swallow evaluation multiple times.  Dobbhoff tube is placed.  s/p palliative care meeting on 08/30/2017 for further decide about goals of care.  pt is DNR.  -family decided to pursue PEG tube, IR consulted, status post PEG 8/21   Assessment & Plan:   Hemorrhagic shock: -resolved -Suspected to be secondary to diverticular bleed versus recurrent colonic AVM -Gastroenterology was consulted, EGD on 8/15 by Dr. Lyndel Safe negative for upper GI source of bleeding, due to overall very poor prognosis and contractures no plans to pursue colonoscopic evaluation, no significant ongoing GI bleed at this time -CT abdomen and pelvis was negative for etiology of acute bleed as well -Aspirin and Plavix are on hold -stable, no active bleeding   Acute respiratory failure with hypoxia:  -patient was unresponsive in the emergency room in the setting of hemorrhagic shock  -Intubated 8/5, also treated for aspiration pneumonia, noted to have worsening dysphagia -She was extubated on 8/9 -Completed antibiotic course -Respiratory failure has resolved weaned down to room  air now, however continues to be high aspiration risk  Encephalopathy with Dysphagia: -Secondary to hemorrhagic shock with hypoperfusion, aspiration pneumonia and prior CVA with atrophy -prior to this admission was reportedly interactive, able to carry conversation, following extubation has been noted to be nonverbal, and had worsening dysphagia -She was seen by neurology on admission, underwent CTA head and neck-this did not show any evidence of large vessel occlusion - Has failed numerous swallow studies, she had a cortrak placed 8/12 after multiple attempts, started tube feeds - Seen by palliative medicine, status post goals of care meeting, now DO NOT RESUSCITATE however family wanted to pursue PEG tube placement despite multiple discussions on poor prognosis -PEG tube placed in interventional radiology on 8/21,started tube feeds 8/22, RD consulting -In terms of her encephalopathy has stopped all sedating medications, EEG was negative for epileptiform discharges and repeat CT head 8/21 without any acute findings -more alert now, follows some commands but moans and groans only -remains stable  H/o CVA  -aspirin and Plavix held due to hemorrhagic shock and severe recurrent lower GI bleed  Hypertension: -continue metoprolol to 75 mg BID,  amlodipine 10 mg daily.    H/o polymyositis:  -unable to walk, wheelchair/bed bound at baseline long before her stroke secondary to severe polymyositis, also has chronic adrenal insufficiency.  -continue cellcept and prednisone.   Deconditioning.  Due to CVA AND Polymyositis.  Bed bound with contractures on lower extremities.  -needs SNF at discharge.   Ethics: this is a chronically ill female, with worsening cognition and mental status, recent stroke, worsening dysphagia, ongoing failure to thrive -Seen by palliative medicine, prognosis remains poor -DNR, back to SNF with palliative  care   DVT prophylaxis:SCDs Code Status:DNR Family  Communication: discussed with daughter, son and extended family at bedside Disposition Plan: SNF when mentation a little better, tolerating tube feeds  Consultants:   Gastroenterology   Palliative care.   PCCM  SLP  Procedures: EGD   Antimicrobials:none.   Subjective: -no new events, remains mostly non verbal, moaning and groaning  Objective: Vitals:   09/04/17 1830 09/04/17 2134 09/05/17 0421 09/05/17 0742  BP: 121/71 128/87 101/80 110/75  Pulse: (!) 104 (!) 118 84 (!) 103  Resp: 20 16 18 19   Temp: 98.3 F (36.8 C) 98.8 F (37.1 C) 98.2 F (36.8 C) 98.3 F (36.8 C)  TempSrc: Oral Oral Oral Oral  SpO2: 95% 95% 92% 99%  Weight:  59.7 kg    Height:        Intake/Output Summary (Last 24 hours) at 09/05/2017 1243 Last data filed at 09/05/2017 0758 Gross per 24 hour  Intake 1237 ml  Output 2300 ml  Net -1063 ml   Filed Weights   08/30/17 0609 09/03/17 1940 09/04/17 2134  Weight: 66.2 kg 63.4 kg 59.7 kg   Examination:  Gen: elderly, debilitated, chronically ill female, moaning and groaning, more alert, tracks HEENT: PERRLA, Neck supple, no JVD Lungs:   few scattered rhonchi, decreased breath sounds at both bases CVS: RRR,No Gallops,Rubs or new Murmurs Abd: soft, Non tender, non distended, BS present, G tube noted Extremities: No Cyanosis, Clubbing or edema Skin: no new rashes  Data Reviewed: I have personally reviewed following labs and imaging studies  CBC: Recent Labs  Lab 08/30/17 0313 09/01/17 0323 09/02/17 0657 09/03/17 0323 09/04/17 0422  WBC 12.4* 17.2* 9.7 11.4* 10.5  HGB 10.4* 10.3* 10.6* 10.0* 11.2*  HCT 33.0* 33.4* 34.9* 32.1* 35.3*  MCV 84.8 86.5 88.1 86.1 85.3  PLT 333 335 357 317 161   Basic Metabolic Panel: Recent Labs  Lab 08/30/17 0313 09/01/17 0323 09/02/17 1157 09/03/17 0323 09/04/17 0422  NA 141 145 142 138 137  K 3.8 3.4* 3.0* 3.7 4.1  CL 104 104 107 104 105  CO2 28 26 26 27 25   GLUCOSE 144* 139* 179* 86 138*  BUN 20  23 19 12 13   CREATININE 0.45 0.54 0.44 0.39* 0.36*  CALCIUM 8.6* 8.9 8.0* 8.1* 8.4*  MG  --   --  2.1  --   --    GFR: Estimated Creatinine Clearance: 46.6 mL/min (A) (by C-G formula based on SCr of 0.36 mg/dL (L)). Liver Function Tests: Recent Labs  Lab 09/02/17 1157  AST 27  ALT 36  ALKPHOS 50  BILITOT 0.7  PROT 4.5*  ALBUMIN 2.3*   No results for input(s): LIPASE, AMYLASE in the last 168 hours. No results for input(s): AMMONIA in the last 168 hours. Coagulation Profile: Recent Labs  Lab 09/02/17 0657  INR 0.97   Cardiac Enzymes: No results for input(s): CKTOTAL, CKMB, CKMBINDEX, TROPONINI in the last 168 hours. BNP (last 3 results) No results for input(s): PROBNP in the last 8760 hours. HbA1C: No results for input(s): HGBA1C in the last 72 hours. CBG: Recent Labs  Lab 09/04/17 2004 09/05/17 0007 09/05/17 0423 09/05/17 0741 09/05/17 1202  GLUCAP 138* 182* 104* 106* 152*   Lipid Profile: No results for input(s): CHOL, HDL, LDLCALC, TRIG, CHOLHDL, LDLDIRECT in the last 72 hours. Thyroid Function Tests: No results for input(s): TSH, T4TOTAL, FREET4, T3FREE, THYROIDAB in the last 72 hours. Anemia Panel: No results for input(s): VITAMINB12, FOLATE, FERRITIN, TIBC, IRON, RETICCTPCT  in the last 72 hours. Sepsis Labs: No results for input(s): PROCALCITON, LATICACIDVEN in the last 168 hours.  No results found for this or any previous visit (from the past 240 hour(s)).   Radiology Studies: No results found. Scheduled Meds: . amLODipine  10 mg Oral Daily  . chlorhexidine  15 mL Mouth Rinse BID  . Chlorhexidine Gluconate Cloth  6 each Topical Daily  . diphenoxylate-atropine  5 mL Per Tube BID  . feeding supplement (OSMOLITE 1.5 CAL)  237 mL Per Tube QID  . feeding supplement (PRO-STAT SUGAR FREE 64)  30 mL Per Tube BID  . free water  200 mL Per Tube Q8H  . mouth rinse  15 mL Mouth Rinse q12n4p  . metoprolol tartrate  75 mg Per Tube BID  . mycophenolate  1,000 mg  Per Tube BID  . pantoprazole sodium  40 mg Per Tube Daily  . potassium chloride  30 mEq Per Tube Daily  . predniSONE  20 mg Per Tube Q12H  . sodium chloride flush  3 mL Intravenous Q12H   Continuous Infusions: . dextrose 50 mL/hr at 09/04/17 2112    LOS: 19 days   Time spent: 25 min  Domenic Polite, MD [facility] Page via [email or web address] If 7PM-7AM, please contact night-coverage [email or web address] Password [facility] 09/05/2017, 12:43 PM

## 2017-09-06 LAB — GLUCOSE, CAPILLARY
GLUCOSE-CAPILLARY: 75 mg/dL (ref 70–99)
GLUCOSE-CAPILLARY: 94 mg/dL (ref 70–99)
GLUCOSE-CAPILLARY: 94 mg/dL (ref 70–99)
GLUCOSE-CAPILLARY: 97 mg/dL (ref 70–99)
Glucose-Capillary: 111 mg/dL — ABNORMAL HIGH (ref 70–99)
Glucose-Capillary: 89 mg/dL (ref 70–99)

## 2017-09-06 MED ORDER — FREE WATER
300.0000 mL | Freq: Three times a day (TID) | Status: DC
  Administered 2017-09-06 – 2017-09-09 (×9): 300 mL

## 2017-09-06 MED ORDER — MYCOPHENOLATE 200 MG/ML ORAL SUSPENSION
1000.0000 mg | Freq: Two times a day (BID) | ORAL | Status: DC
  Administered 2017-09-06 – 2017-09-09 (×6): 1000 mg
  Filled 2017-09-06 (×8): qty 20

## 2017-09-06 NOTE — Progress Notes (Signed)
PROGRESS NOTE    [name] [name]  HER:[id number] DOB: [birth year] DOA: 08/17/2017 PCP: [name], MD    Brief Narrative:  77yo female SNF resident with a hx of polymyositis, CVA with complete right hemiparesis, contracted, gastric leiomyoma, hypertension, GERD, and diastolic CHF who was brought to the ED w/ bright red blood per rectum. She had episodes of hypotension and unresponsiveness in the ED and required intubation. She was transfused 2 unit of PRBC transfusion.  She was extubated on 8/8.  subsequently transferred from ICU to hospitalist service. -Gastroenterology was consulted, underwent EGD.  CT abdomen and pelvis without any acute abnormality.  Aspirin Plavix have been on hold.  Considering patient being contracted, no further endoscopic work-up is planned.  Patient had few other episodes of bloody stools, however improved.  Patient failed swallow evaluation multiple times.  Dobbhoff tube is placed.  s/p palliative care meeting on 08/30/2017 for further decide about goals of care.  pt is DNR.  -family decided to pursue PEG tube, IR consulted, status post PEG 8/21   Assessment & Plan:   Hemorrhagic shock: -resolved -Suspected to be secondary to diverticular bleed versus recurrent colonic AVM -Gastroenterology was consulted, EGD on 8/15 by Dr. Lyndel Safe negative for upper GI source of bleeding, due to overall very poor prognosis and contractures no plans to pursue colonoscopic evaluation, no significant ongoing GI bleed at this time -CT abdomen and pelvis was negative for etiology of acute bleed as well -Aspirin and Plavix on hold -stable, no active bleeding   Acute respiratory failure with hypoxia:  -patient was unresponsive in the emergency room in the setting of hemorrhagic shock  -Intubated 8/5, also treated for aspiration pneumonia, noted to have worsening dysphagia -She was extubated on 8/9 -Completed antibiotic course -Respiratory failure has resolved weaned down to room air  now, however continues to be high aspiration risk  Encephalopathy with Dysphagia: -Secondary to hemorrhagic shock with hypoperfusion, aspiration pneumonia and prior CVA with atrophy -prior to this admission was reportedly interactive, able to carry conversation, following extubation has been noted to be nonverbal, and had worsening dysphagia -She was seen by neurology on admission, underwent CTA head and neck-this did not show any evidence of large vessel occlusion - Has failed numerous swallow studies, she had a cortrak placed 8/12 after multiple attempts, started tube feeds - Seen by palliative medicine, status post goals of care meeting, now DO NOT RESUSCITATE however family wanted to pursue PEG tube placement despite multiple discussions on poor prognosis -PEG tube placed in interventional radiology on 8/21,started tube feeds 8/22, RD consulting -In terms of her encephalopathy has stopped all sedating medications, EEG was negative for epileptiform discharges and repeat CT head 8/21 without any acute findings -more stable now, she is alert awake but only moans and groans  H/o CVA  -aspirin and Plavix held due to hemorrhagic shock and severe recurrent lower GI bleed  Hypertension: -continue metoprolol to 75 mg BID,  amlodipine 10 mg daily.    H/o polymyositis:  -unable to walk, wheelchair/bed bound at baseline long before her stroke secondary to severe polymyositis, also has chronic adrenal insufficiency.  -continue cellcept and prednisone.   Deconditioning.  Due to CVA AND Polymyositis.  Bed bound with contractures on lower extremities.  -needs SNF at discharge.   Ethics: this is a chronically ill female, with worsening cognition and mental status, recent stroke, worsening dysphagia, ongoing failure to thrive -Seen by palliative medicine, prognosis remains poor -DNR, back to SNF with palliative care  DVT prophylaxis:SCDs Code Status:DNR Family Communication: discussed with  daughter, son and extended family at bedside Disposition Plan: SNF 8/26 if no new events  Consultants:   Gastroenterology   Palliative care.   PCCM  SLP  Procedures: EGD   Antimicrobials:none.   Subjective: -no changes, no events overnight, tolerating tube feeds, continues to mumble, unintelligible speech  Objective: Vitals:   09/05/17 1545 09/05/17 2029 09/06/17 0500 09/06/17 0952  BP: (!) 109/55 125/68 (!) 140/96 117/78  Pulse: 95 (!) 106 (!) 112 94  Resp: 15 18 16 18   Temp: 98.2 F (36.8 C) 98.5 F (36.9 C) 98.6 F (37 C) 98.4 F (36.9 C)  TempSrc: Oral Oral Oral Axillary  SpO2: 99% 100% 99% 100%  Weight:      Height:        Intake/Output Summary (Last 24 hours) at 09/06/2017 1338 Last data filed at 09/06/2017 1306 Gross per 24 hour  Intake 868 ml  Output 3075 ml  Net -2207 ml   Filed Weights   08/30/17 0609 09/03/17 1940 09/04/17 2134  Weight: 66.2 kg 63.4 kg 59.7 kg   Examination:  Gen: elderly, debilitated, chronically ill female, moaning and groaning, more alert, tracks HEENT: neck supple Lungs: few scattered rhonchi, conducted upper airway sounds CVS: S1-S2/regular rate rhythm  Abd: soft, Non tender, non distended, BS present, PEG tube Extremities: No edema Skin: no new rashes  Data Reviewed: I have personally reviewed following labs and imaging studies  CBC: Recent Labs  Lab 09/01/17 0323 09/02/17 0657 09/03/17 0323 09/04/17 0422  WBC 17.2* 9.7 11.4* 10.5  HGB 10.3* 10.6* 10.0* 11.2*  HCT 33.4* 34.9* 32.1* 35.3*  MCV 86.5 88.1 86.1 85.3  PLT 335 357 317 628   Basic Metabolic Panel: Recent Labs  Lab 09/01/17 0323 09/02/17 1157 09/03/17 0323 09/04/17 0422  NA 145 142 138 137  K 3.4* 3.0* 3.7 4.1  CL 104 107 104 105  CO2 26 26 27 25   GLUCOSE 139* 179* 86 138*  BUN 23 19 12 13   CREATININE 0.54 0.44 0.39* 0.36*  CALCIUM 8.9 8.0* 8.1* 8.4*  MG  --  2.1  --   --    GFR: Estimated Creatinine Clearance: 46.6 mL/min (A) (by C-G  formula based on SCr of 0.36 mg/dL (L)). Liver Function Tests: Recent Labs  Lab 09/02/17 1157  AST 27  ALT 36  ALKPHOS 50  BILITOT 0.7  PROT 4.5*  ALBUMIN 2.3*   No results for input(s): LIPASE, AMYLASE in the last 168 hours. No results for input(s): AMMONIA in the last 168 hours. Coagulation Profile: Recent Labs  Lab 09/02/17 0657  INR 0.97   Cardiac Enzymes: No results for input(s): CKTOTAL, CKMB, CKMBINDEX, TROPONINI in the last 168 hours. BNP (last 3 results) No results for input(s): PROBNP in the last 8760 hours. HbA1C: No results for input(s): HGBA1C in the last 72 hours. CBG: Recent Labs  Lab 09/05/17 1642 09/05/17 2025 09/06/17 0458 09/06/17 0730 09/06/17 1146  GLUCAP 65* 149* 111* 94 97   Lipid Profile: No results for input(s): CHOL, HDL, LDLCALC, TRIG, CHOLHDL, LDLDIRECT in the last 72 hours. Thyroid Function Tests: No results for input(s): TSH, T4TOTAL, FREET4, T3FREE, THYROIDAB in the last 72 hours. Anemia Panel: No results for input(s): VITAMINB12, FOLATE, FERRITIN, TIBC, IRON, RETICCTPCT in the last 72 hours. Sepsis Labs: No results for input(s): PROCALCITON, LATICACIDVEN in the last 168 hours.  No results found for this or any previous visit (from the past 240 hour(s)).  Radiology Studies: No results found. Scheduled Meds: . amLODipine  10 mg Oral Daily  . chlorhexidine  15 mL Mouth Rinse BID  . Chlorhexidine Gluconate Cloth  6 each Topical Daily  . feeding supplement (OSMOLITE 1.5 CAL)  237 mL Per Tube QID  . feeding supplement (PRO-STAT SUGAR FREE 64)  30 mL Per Tube BID  . free water  200 mL Per Tube Q8H  . mouth rinse  15 mL Mouth Rinse q12n4p  . metoprolol tartrate  75 mg Per Tube BID  . mycophenolate  1,000 mg Per Tube BID  . pantoprazole sodium  40 mg Per Tube Daily  . potassium chloride  30 mEq Per Tube Daily  . predniSONE  20 mg Per Tube Q12H  . sodium chloride flush  3 mL Intravenous Q12H   Continuous Infusions: . dextrose 50  mL/hr at 09/06/17 1306    LOS: 20 days   Time spent: 25 min  Domenic Polite, MD [facility] Page via [email or web address] If 7PM-7AM, please contact night-coverage [email or web address] Password [email or web address] 09/06/2017, 1:38 PM

## 2017-09-06 NOTE — Progress Notes (Signed)
Cleaned patient with her third loose bowel movement,notice both buttocks has MASD,open with red wound base measuring 1 x 1 cm.Pink mapilex foam applied on both buttocks.

## 2017-09-06 NOTE — Progress Notes (Signed)
Patient has been having multiple loose incontinent stools over the past couple days. MASD to left buttock. Area is open. Red wound base. Measuring 1 x 1 cm.

## 2017-09-06 NOTE — Progress Notes (Addendum)
Cleaned patient for her recent  second loose bowel movement with other nurse.Noted a MASD to left buttock,open with red wound base measuring 1 x 1 cm.Thick barrier cream applied.

## 2017-09-07 LAB — GLUCOSE, CAPILLARY
GLUCOSE-CAPILLARY: 104 mg/dL — AB (ref 70–99)
GLUCOSE-CAPILLARY: 106 mg/dL — AB (ref 70–99)
Glucose-Capillary: 106 mg/dL — ABNORMAL HIGH (ref 70–99)
Glucose-Capillary: 77 mg/dL (ref 70–99)
Glucose-Capillary: 87 mg/dL (ref 70–99)
Glucose-Capillary: 93 mg/dL (ref 70–99)

## 2017-09-07 MED ORDER — ALPRAZOLAM 0.25 MG PO TABS: 0.2500 mg | ORAL_TABLET | Freq: Two times a day (BID) | ORAL | 0 refills | Status: DC | PRN

## 2017-09-07 MED ORDER — PANTOPRAZOLE SODIUM 40 MG PO PACK: 40.0000 mg | PACK | Freq: Every day | ORAL | Status: AC

## 2017-09-07 MED ORDER — ALLOPURINOL 100 MG PO TABS: 100.0000 mg | ORAL_TABLET | Freq: Every day | ORAL | 0 refills | Status: AC

## 2017-09-07 MED ORDER — DIPHENOXYLATE-ATROPINE 2.5-0.025 MG/5ML PO LIQD: 5.0000 mL | Freq: Every day | ORAL | 0 refills | Status: AC | PRN

## 2017-09-07 MED ORDER — MYCOPHENOLATE 200 MG/ML ORAL SUSPENSION: 1000.0000 mg | Freq: Two times a day (BID) | ORAL | Status: DC

## 2017-09-07 MED ORDER — PRO-STAT SUGAR FREE PO LIQD: 30.0000 mL | Freq: Two times a day (BID) | ORAL | 0 refills | Status: AC

## 2017-09-07 MED ORDER — ACETAMINOPHEN 160 MG/5ML PO SOLN: 650.0000 mg | Freq: Four times a day (QID) | ORAL | 0 refills | Status: AC | PRN

## 2017-09-07 MED ORDER — DIPHENOXYLATE-ATROPINE 2.5-0.025 MG/5ML PO LIQD
5.0000 mL | Freq: Every day | ORAL | Status: DC | PRN
  Administered 2017-09-07: 5 mL
  Filled 2017-09-07: qty 5

## 2017-09-07 MED ORDER — OSMOLITE 1.5 CAL PO LIQD: 237.0000 mL | Freq: Four times a day (QID) | ORAL | 0 refills | Status: AC

## 2017-09-07 MED ORDER — AMLODIPINE BESYLATE 10 MG PO TABS: 10.0000 mg | ORAL_TABLET | Freq: Every day | ORAL | Status: DC

## 2017-09-07 MED ORDER — PREDNISONE 5 MG/ML PO CONC: 20.0000 mg | Freq: Two times a day (BID) | ORAL | 0 refills | Status: AC

## 2017-09-07 MED ORDER — FREE WATER: 300.0000 mL | Freq: Three times a day (TID) | Status: AC

## 2017-09-07 MED ORDER — ASPIRIN 81 MG PO CHEW: 81.0000 mg | CHEWABLE_TABLET | Freq: Every day | ORAL | Status: AC

## 2017-09-07 MED ORDER — POTASSIUM CHLORIDE 20 MEQ/15ML (10%) PO SOLN: 20.0000 meq | Freq: Every day | ORAL | 0 refills | Status: DC

## 2017-09-07 MED ORDER — METOPROLOL TARTRATE 25 MG/10 ML ORAL SUSPENSION: 75.0000 mg | Freq: Two times a day (BID) | ORAL | Status: DC

## 2017-09-07 NOTE — Discharge Summary (Addendum)
Physician Discharge Summary  [name] [name] UXL:[id number] DOB: [birth year] DOA: 08/17/2017  PCP: [name], MD  Admit date: 08/17/2017 Discharge date: 09/09/2017  Patient was seen and examined, discharge summary has been updated, no significant change in physical exam, noted adjustment to the medication list, amlodipine has been stopped, metoprolol and potassium dose has been lowered.  Recommendations for Outpatient Follow-up:  1. Please call and ensure, palliative care follow-up at the SNF, needs ongoing goals of care discussion and likely transition to hospice if and when she deteriorates further   Discharge Diagnoses:    Hemorrhagic shock   Acute hypoxic respiratory failure   Encephalopathy   Dysphagia   Severe Polymyositis (HCC)   Adrenal insufficiency (HCC)   Acute blood loss anemia   History of CVA (cerebral vascular accident) ([place])   Acute upper GI bleed   Nontraumatic hemorrhagic shock ([place])   Adult failure to thrive  Discharge Condition: poor  Diet recommendation: nothing by mouth, tube feeds and free water  Filed Weights   08/30/17 0609 09/03/17 1940 09/04/17 2134  Weight: 66.2 kg 63.4 kg 59.7 kg    History of present illness:  77yo female SNF resident with a hx of polymyositis, CVA with complete right hemiparesis, contracted, gastric leiomyoma, hypertension, GERD, and diastolic CHF who was brought to the ED w/ bright red blood per rectum. She had episodes of hypotension and unresponsiveness in the ED and required intubation  Hospital Course:   Hemorrhagic shock: -admitted to ICU with hypotension and lower GI bleed, now resolved -Suspected to be secondary to diverticular bleed versus recurrent colonic AVM -Gastroenterology was consulted, EGD on 8/15 by Dr. Lyndel Safe negative for upper GI source of bleeding, due to overall very poor prognosis and contractures no plans to pursue colonoscopic evaluation, no significant ongoing GI bleed at this time -CT abdomen and pelvis  was negative for etiology of acute bleed as well -Aspirin and Plavix are on hold -stable, no active bleeding at this time  Acute respiratory failure with hypoxia:  -patient was unresponsive in the emergency room in the setting of hemorrhagic shock  -Intubated 8/5, admitted to ICU,  treated for aspiration pneumonia, noted to have worsening dysphagia -She was extubated on 8/9 -Completed antibiotic course -Respiratory failure has resolved weaned down to room air now, however continues to be high aspiration risk  Encephalopathy with Dysphagia: -Secondary to hemorrhagic shock with hypoperfusion, aspiration pneumonia and prior CVA with atrophy, 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 to vascular dementia -prior to this admission was reportedly interactive, able to carry some conversation, following extubation has been noted to be nonverbal, and had worsening dysphagia -She was seen by neurology on admission, underwent CTA head and neck-this did not show any evidence of large vessel occlusion - Has failed numerous swallow studies, she had a cortrak placed 8/12 after multiple attempts, started tube feeds - Seen by palliative medicine, status post goals of care meeting, now DO NOT RESUSCITATE however family wanted to pursue PEG tube placement despite multiple discussions on poor prognosis -PEG tube placed in interventional radiology on 8/21,started tube feeds 8/22 -In terms of her encephalopathy, we stopped all sedating medications, EEG was negative for epileptiform discharges and repeat CT head 8/21 without any acute findings -more alert now, follows some commands but moans and groans only -remains stable, at risk of ongoing decline poor prognosis has been discussed with family numerous times  H/o CVA  -aspirin and Plavix held due to hemorrhagic shock and severe recurrent lower GI bleed  Hypertension: -continue metoprolol to  75 mg BID,  amlodipine 10 mg daily.    H/o polymyositis:  -unable to walk,  wheelchair/bed bound at baseline long before her stroke secondary to severe polymyositis, also has chronic adrenal insufficiency.  -continue cellcept and prednisone.   Deconditioning.  Due to CVA AND Polymyositis.  Bed bound with contractures on lower extremities.  -needs SNF at discharge.   Diarrhea -Secondary to tube feeds, continue Imodium or Lomotil when necessary  Ethics: this is a chronically ill female, with worsening cognition and mental status, recent stroke, worsening dysphagia, ongoing failure to thrive -Seen by palliative medicine, prognosis remains poor -DNR, back to SNF with palliative care   Code Status:DNR  Discharge Exam: Vitals:   09/09/17 1021 09/09/17 1038  BP: 103/71 127/69  Pulse: 100 (!) 108  Resp:  18  Temp:    SpO2:  93%    General: Alert, awake, non verbal Cardiovascular: S1S2/RRR Respiratory: CTAB  Discharge Instructions   Discharge Instructions    Increase activity slowly   Complete by:  As directed      Allergies as of 09/09/2017      Reactions   Codeine Other (See Comments)   Feels drunk   Hydrocodone    "makes me sleepy"   Tramadol Hcl Other (See Comments)   Makes me feel drunk      Medication List    STOP taking these medications   acetaminophen 650 MG CR tablet Commonly known as:  TYLENOL Replaced by:  acetaminophen 160 MG/5ML solution   atenolol 50 MG tablet Commonly known as:  TENORMIN   CALCIUM 600 PO   clopidogrel 75 MG tablet Commonly known as:  PLAVIX   ferrous sulfate 325 (65 FE) MG tablet   fluticasone 50 MCG/ACT nasal spray Commonly known as:  FLONASE   folic acid 315 MCG tablet Commonly known as:  FOLVITE   furosemide 40 MG tablet Commonly known as:  LASIX   gabapentin 100 MG capsule Commonly known as:  NEURONTIN   mycophenolate 250 MG capsule Commonly known as:  CELLCEPT   omega-3 acid ethyl esters 1 g capsule Commonly known as:  LOVAZA   oxyCODONE 5 MG immediate release  tablet Commonly known as:  Oxy IR/ROXICODONE   pantoprazole 40 MG tablet Commonly known as:  PROTONIX Replaced by:  pantoprazole sodium 40 mg/20 mL Pack   potassium chloride SA 20 MEQ tablet Commonly known as:  K-DUR,KLOR-CON   predniSONE 20 MG tablet Commonly known as:  DELTASONE Replaced by:  predniSONE 5 MG/ML concentrated solution   senna-docusate 8.6-50 MG tablet Commonly known as:  Senokot-S   vitamin B-12 1000 MCG tablet Commonly known as:  CYANOCOBALAMIN   vitamin C 500 MG tablet Commonly known as:  ASCORBIC ACID     TAKE these medications   acetaminophen 160 MG/5ML solution Commonly known as:  TYLENOL Place 20.3 mLs (650 mg total) into feeding tube every 6 (six) hours as needed for mild pain, headache or fever. Replaces:  acetaminophen 650 MG CR tablet   allopurinol 100 MG tablet Commonly known as:  ZYLOPRIM Place 1 tablet (100 mg total) into feeding tube daily. What changed:  how to take this   ALPRAZolam 0.25 MG tablet Commonly known as:  XANAX Place 1 tablet (0.25 mg total) into feeding tube 2 (two) times daily as needed for anxiety (for 14 days.). What changed:  how to take this   aspirin 81 MG chewable tablet Place 1 tablet (81 mg total) into feeding tube daily. What changed:  how to take this   diphenoxylate-atropine 2.5-0.025 MG/5ML liquid Commonly known as:  LOMOTIL Place 5 mLs into feeding tube daily as needed for diarrhea or loose stools.   feeding supplement (OSMOLITE 1.5 CAL) Liqd Place 237 mLs into feeding tube 4 (four) times daily.   feeding supplement (PRO-STAT SUGAR FREE 64) Liqd Place 30 mLs into feeding tube 2 (two) times daily.   free water Soln Place 300 mLs into feeding tube every 8 (eight) hours.   metoprolol tartrate 25 mg/10 mL Susp Commonly known as:  LOPRESSOR Place 20 mLs (50 mg total) into feeding tube 2 (two) times daily.   mycophenolate 50 mg/mL Susp oral suspension Commonly known as:  CELLCEPT Place 20 mLs (1,000  mg total) into feeding tube 2 (two) times daily.   pantoprazole sodium 40 mg/20 mL Pack Commonly known as:  PROTONIX Place 20 mLs (40 mg total) into feeding tube daily. Replaces:  pantoprazole 40 MG tablet   potassium chloride 20 MEQ/15ML (10%) Soln Place 7.5 mLs (10 mEq total) into feeding tube daily.   predniSONE 5 MG/ML concentrated solution Place 4 mLs (20 mg total) into feeding tube every 12 (twelve) hours. Replaces:  predniSONE 20 MG tablet      Allergies  Allergen Reactions  . Codeine Other (See Comments)    Feels drunk  . Hydrocodone     "makes me sleepy"  . Tramadol Hcl Other (See Comments)    Makes me feel drunk   Follow-up Information    [facility]. Schedule an appointment as soon as possible for a visit in 3 week(s).   Why:  Follow Up stroke and evaluate for dementia: Had been scheduled to see Venancio Poisson but missed appointment due to hospitalization.     Contact information: [address]     [address] [place] [address] [phone]       [name], MD. Schedule an appointment as soon as possible for a visit in 1 week(s).   Specialty:  Internal Medicine Contact information: [place] [address] [phone]            The results of significant diagnostics from this hospitalization (including imaging, microbiology, ancillary and laboratory) are listed below for reference.    Significant Diagnostic Studies: Ct Angio Head W Or Wo Contrast  Result Date: 08/17/2017 CLINICAL DATA:  Focal neuro deficit. Altered mental status. Periods of unresponsiveness EXAM: CT ANGIOGRAPHY HEAD AND NECK TECHNIQUE: Multidetector CT imaging of the head and neck was performed using the standard protocol during bolus administration of intravenous contrast. Multiplanar CT image reconstructions and MIPs were obtained to evaluate the vascular anatomy. Carotid stenosis measurements (when applicable) are obtained  utilizing NASCET criteria, using the distal internal carotid diameter as the denominator. CONTRAST:  120mL ISOVUE-370 IOPAMIDOL (ISOVUE-370) INJECTION 76% COMPARISON:  Noncontrast head CT earlier today. CTA head neck 06/09/2017 FINDINGS: CTA NECK FINDINGS Aortic arch: Normal.  Three vessel branching. Right carotid system: Limited by motion at the distal ICA. Visible vessels are smooth and widely patent. Left carotid system: Limited by motion at the distal left ICA. Visible vessels are smooth and widely patent. Vertebral arteries: No proximal subclavian flow limiting stenosis. Both vertebral arteries are patent in the neck. Skeleton: No acute finding Other neck: No acute finding. Upper chest: Porta catheter on the right.  No acute finding. Review of the MIP images confirms the above findings CTA HEAD FINDINGS Anterior circulation: No evidence of emergent large vessel occlusion or proximal flow limiting  stenosis atherosclerotic narrowing and irregularity of medium size vessels (M2 level and beyond) that is extensive, see markings on MIP images. Negative for aneurysm. Posterior circulation: The vertebral and basilar arteries are smooth and widely patent. Symmetric flow in bilateral branches. Atherosclerotic irregularity of left more than right PCA branches. Negative for aneurysm Venous sinuses: Patent Anatomic variants: None significant Delayed phase: Not obtained Review of the MIP images confirms the above findings IMPRESSION: 1. No emergent large vessel occlusion or other acute finding. Stable from 06/09/2017. 2. Extensive atherosclerotic irregularity and narrowing of intracranial medium size vessels. 3. No significant atherosclerosis or stenosis in the neck. Electronically Signed   By: Monte Fantasia M.D.   On: 08/17/2017 11:31   Dg Chest 1 View  Result Date: 08/22/2017 CLINICAL DATA:  Hypoxia, altered mental status EXAM: CHEST  1 VIEW COMPARISON:  the previous day's study FINDINGS: The left pigtail drain  catheter is been removed. No pneumothorax evident. Blunting of the left lateral costophrenic angle suggesting small effusion. Stable right IJ port catheter. Atelectasis/infiltrate in the lung bases left greater than right, improved since prior study. Heart size upper limits normal for technique. Aortic Atherosclerosis (ICD10-170.0). Regional bones unremarkable. IMPRESSION: 1. Interval removal of left chest tube with no pneumothorax. 2. Possible small left pleural effusion. 3. Bibasilar atelectasis/consolidation, improved since previous exam Electronically Signed   By: Lucrezia Europe M.D.   On: 08/22/2017 10:04   Dg Abd 1 View  Result Date: 09/01/2017 CLINICAL DATA:  Evaluate feeding tube EXAM: ABDOMEN - 1 VIEW COMPARISON:  08/23/2017 FINDINGS: Single fluoroscopic image of the abdomen. According to study nodes, feeding tube with unclogged in radiology by guidewire. Total fluoroscopy time was 24 seconds. Subsegmental atelectasis at the right base. Probable tiny left effusion. Tip of the feeding tube projects over the distal stomach. IMPRESSION: Tip of feeding tube projects over the distal stomach Electronically Signed   By: Donavan Foil M.D.   On: 09/01/2017 19:59   Ct Head Wo Contrast  Result Date: 09/02/2017 CLINICAL DATA:  Encephalopathy EXAM: CT HEAD WITHOUT CONTRAST TECHNIQUE: Contiguous axial images were obtained from the base of the skull through the vertex without intravenous contrast. COMPARISON:  08/19/2017 FINDINGS: Brain: No findings to suggest acute hemorrhage, acute infarction or space-occupying mass lesion are noted. Marked bilateral chronic white matter ischemic change is noted stable from the prior exam. Vascular: No hyperdense vessel or unexpected calcification. Skull: Normal. Negative for fracture or focal lesion. Sinuses/Orbits: No acute finding. Other: None. IMPRESSION: Chronic white matter ischemic changes and atrophic changes stable from the prior exam. No acute abnormality is noted.  Electronically Signed   By: Inez Catalina M.D.   On: 09/02/2017 19:45   Ct Head Wo Contrast  Result Date: 08/19/2017 CLINICAL DATA:  Altered mental status.  Cephalopathy. EXAM: CT HEAD WITHOUT CONTRAST TECHNIQUE: Contiguous axial images were obtained from the base of the skull through the vertex without intravenous contrast. COMPARISON:  08/17/2017. FINDINGS: Brain: Diffusely enlarged ventricles and subarachnoid spaces. Patchy white matter low density in both cerebral hemispheres. Streak artifacts from a high density lead attached to the right frontal bone. No intracranial hemorrhage, mass lesion or CT evidence of acute infarction. Vascular: No hyperdense vessel or unexpected calcification. Skull: Normal. Negative for fracture or focal lesion. Sinuses/Orbits: Unremarkable. Other: None. IMPRESSION: 1. No acute abnormality. 2. Stable mild diffuse cerebral and cerebellar atrophy. 3. Stable marked chronic small vessel white matter ischemic changes in both cerebral hemispheres. Electronically Signed   By: Percell Locus.D.  On: 08/19/2017 13:50   Ct Head Wo Contrast  Result Date: 08/17/2017 CLINICAL DATA:  Bilateral arm weakness EXAM: CT HEAD WITHOUT CONTRAST TECHNIQUE: Contiguous axial images were obtained from the base of the skull through the vertex without intravenous contrast. COMPARISON:  06/09/2017 brain MRI FINDINGS: Brain: Low-density in the posterior left frontal subcortical white matter is in close approximation to infarcts seen on 06/09/2017 brain MRI. There is a background of advanced chronic small vessel ischemia with confluent low-density in the cerebral white matter. Indistinct deep gray nuclei from chronic ischemic injury, including discrete remote lacunar infarct in the right thalamus. No evident hemorrhage, mass, or hydrocephalus. Subcentimeter left parietal inner table calcification considered incidental. Vascular: No hyperdense vessel.  Atherosclerotic calcification. Skull: No evidence of  fracture or bone lesion. Sinuses/Orbits: No pathologic finding. Bilateral cataract resection. Other: Motion degraded scan, best obtainable due to patient condition. IMPRESSION: 1. Motion degraded study without definite acute finding. 2. A small left cerebral white matter infarct was likely present on 06/09/2017 brain MRI. 3. Severe chronic small vessel ischemia. Electronically Signed   By: Monte Fantasia M.D.   On: 08/17/2017 09:19   Ct Angio Neck W Or Wo Contrast  Result Date: 08/17/2017 CLINICAL DATA:  Focal neuro deficit. Altered mental status. Periods of unresponsiveness EXAM: CT ANGIOGRAPHY HEAD AND NECK TECHNIQUE: Multidetector CT imaging of the head and neck was performed using the standard protocol during bolus administration of intravenous contrast. Multiplanar CT image reconstructions and MIPs were obtained to evaluate the vascular anatomy. Carotid stenosis measurements (when applicable) are obtained utilizing NASCET criteria, using the distal internal carotid diameter as the denominator. CONTRAST:  140mL ISOVUE-370 IOPAMIDOL (ISOVUE-370) INJECTION 76% COMPARISON:  Noncontrast head CT earlier today. CTA head neck 06/09/2017 FINDINGS: CTA NECK FINDINGS Aortic arch: Normal.  Three vessel branching. Right carotid system: Limited by motion at the distal ICA. Visible vessels are smooth and widely patent. Left carotid system: Limited by motion at the distal left ICA. Visible vessels are smooth and widely patent. Vertebral arteries: No proximal subclavian flow limiting stenosis. Both vertebral arteries are patent in the neck. Skeleton: No acute finding Other neck: No acute finding. Upper chest: Porta catheter on the right.  No acute finding. Review of the MIP images confirms the above findings CTA HEAD FINDINGS Anterior circulation: No evidence of emergent large vessel occlusion or proximal flow limiting stenosis atherosclerotic narrowing and irregularity of medium size vessels (M2 level and beyond) that is  extensive, see markings on MIP images. Negative for aneurysm. Posterior circulation: The vertebral and basilar arteries are smooth and widely patent. Symmetric flow in bilateral branches. Atherosclerotic irregularity of left more than right PCA branches. Negative for aneurysm Venous sinuses: Patent Anatomic variants: None significant Delayed phase: Not obtained Review of the MIP images confirms the above findings IMPRESSION: 1. No emergent large vessel occlusion or other acute finding. Stable from 06/09/2017. 2. Extensive atherosclerotic irregularity and narrowing of intracranial medium size vessels. 3. No significant atherosclerosis or stenosis in the neck. Electronically Signed   By: Monte Fantasia M.D.   On: 08/17/2017 11:31   Ir Gastrostomy Tube Mod Sed  Result Date: 09/02/2017 INDICATION: 77 year old female with dysphagia. She is currently taking enteral nutrition via a transpyloric feeding tube and presents for placement of a more durable percutaneous gastrostomy tube. EXAM: Fluoroscopically guided placement of percutaneous pull-through gastrostomy tube Interventional Radiologist:  Criselda Peaches, MD MEDICATIONS: 2 g Ancef; Antibiotics were administered within 1 hour of the procedure. ANESTHESIA/SEDATION: Versed 2 mg IV; Fentanyl  50 mcg IV Moderate Sedation Time:  10 minutes The patient was continuously monitored during the procedure by the interventional radiology nurse under my direct supervision. CONTRAST:  15 mL Isovue-300 FLUOROSCOPY TIME:  Fluoroscopy Time: 1 minutes 54 seconds (7 mGy). COMPLICATIONS: None immediate. PROCEDURE: Informed written consent was obtained from the patient after a thorough discussion of the procedural risks, benefits and alternatives. All questions were addressed. Maximal Sterile Barrier Technique was utilized including caps, mask, sterile gowns, sterile gloves, sterile drape, hand hygiene and skin antiseptic. A timeout was performed prior to the initiation of the  procedure. Maximal barrier sterile technique utilized including caps, mask, sterile gowns, sterile gloves, large sterile drape, hand hygiene, and chlorhexadine skin prep. An angled catheter was advanced over a wire under fluoroscopic guidance through the nose, down the esophagus and into the body of the stomach. The stomach was then insufflated with several 100 ml of air. Fluoroscopy confirmed location of the gastric bubble, as well as inferior displacement of the barium stained colon. Under direct fluoroscopic guidance, a single T-tack was placed, and the anterior gastric wall drawn up against the anterior abdominal wall. Percutaneous access was then obtained into the mid gastric body with an 18 gauge sheath needle. Aspiration of air, and injection of contrast material under fluoroscopy confirmed needle placement. An Amplatz wire was advanced in the gastric body and the access needle exchanged for a 9-French vascular sheath. A snare device was advanced through the vascular sheath and an Amplatz wire advanced through the angled catheter. The Amplatz wire was successfully snared and this was pulled up through the esophagus and out the mouth. A 20-French Alinda Dooms MIC-PEG tube was then connected to the snare and pulled through the mouth, down the esophagus, into the stomach and out to the anterior abdominal wall. Hand injection of contrast material confirmed intragastric location. The T-tack retention suture was then cut. The pull through peg tube was then secured with the external bumper and capped. The patient will be observed for several hours with the newly placed tube on low wall suction to evaluate for any post procedure complication. The patient tolerated the procedure well, there is no immediate complication. IMPRESSION: Successful placement of a 20 French pull through gastrostomy tube. Electronically Signed   By: Jacqulynn Cadet M.D.   On: 09/02/2017 10:24   Dg Chest Port 1 View  Result Date:  08/29/2017 CLINICAL DATA:  Leukocytosis.  History of CHF. EXAM: PORTABLE CHEST 1 VIEW COMPARISON:  08/27/2017 FINDINGS: Right-sided injectable port and feeding catheter in stable position. Enlarged cardiac silhouette. Calcific atherosclerotic disease of the aorta. Mediastinal contours appear intact. There is no evidence of pneumothorax. Left lower lobe airspace consolidation and/or left pleural effusion. Osseous structures are without acute abnormality. Soft tissues are grossly normal. IMPRESSION: Left lower lobe airspace consolidation and/or left pleural effusion, new from patient's prior radiograph. Electronically Signed   By: Fidela Salisbury M.D.   On: 08/29/2017 16:32   Dg Chest Port 1 View  Result Date: 08/27/2017 CLINICAL DATA:  Dyspnea. EXAM: PORTABLE CHEST 1 VIEW COMPARISON:  Radiograph of August 22, 2017. FINDINGS: Stable cardiomegaly. Right internal jugular Port-A-Cath is unchanged in position. No pneumothorax is noted. Elevated right hemidiaphragm is noted with mild right basilar subsegmental atelectasis. Bony thorax is unremarkable. Feeding tube is seen entering the stomach. Bony thorax is unremarkable. IMPRESSION: Stable cardiomegaly. Elevated right hemidiaphragm is noted with mild right basilar subsegmental atelectasis. Interval placement of feeding tube seen entering stomach. Electronically Signed   By: Jeneen Rinks  Murlean Caller, M.D.   On: 08/27/2017 09:20   Dg Chest Port 1 View  Result Date: 08/21/2017 CLINICAL DATA:  History of pneumothorax.  Chest tube in place. EXAM: PORTABLE CHEST 1 VIEW COMPARISON:  August 20, 2016 FINDINGS: No pneumothorax. The left chest tube is stable. A layering effusion with underlying opacity in seen on the right. Probable edema. No nodules or masses. Stable cardiomediastinal silhouette. Stable right Port-A-Cath. IMPRESSION: 1. Left-sided chest tube.  No pneumothorax. 2. Layering effusions bilaterally, right greater than left underlying atelectasis. 3. Pulmonary edema.  Electronically Signed   By: Dorise Bullion III M.D   On: 08/21/2017 09:49   Dg Chest Port 1 View  Result Date: 08/20/2017 CLINICAL DATA:  Assess for pneumothorax. EXAM: PORTABLE CHEST 1 VIEW COMPARISON:  Chest radiograph performed earlier today at 4:32 a.m. FINDINGS: A right-sided chest port is noted ending about the mid SVC. Small bilateral pleural effusions are noted. Increased interstitial markings raise concern for pulmonary edema, worsened from the prior study. No pneumothorax is seen. A left chest pigtail catheter is noted in essentially unchanged position. The cardiomediastinal silhouette is borderline normal in size. No acute osseous abnormalities are seen. IMPRESSION: 1. No pneumothorax seen. Left chest pigtail catheter noted in essentially unchanged position. 2. Small bilateral pleural effusions noted. Increased interstitial markings raise concern for pulmonary edema, worsened from the prior study. Electronically Signed   By: Garald Balding M.D.   On: 08/20/2017 22:50   Dg Chest Port 1 View  Result Date: 08/20/2017 CLINICAL DATA:  Respiratory failure. EXAM: PORTABLE CHEST 1 VIEW COMPARISON:  08/19/2017 FINDINGS: Endotracheal tube remains present with the tip approximately 3 cm above the carina. Stable positioning pigtail chest tube on the left and right-sided Port-A-Cath. Gastric decompression tube extends below the diaphragm. Lungs show slight increase in right basilar atelectasis. The left lower lobe shows improved aeration. No overt edema, evidence of pneumothorax or pleural fluid. IMPRESSION: Improved aeration of the left lower lung. Slight increase in right basilar atelectasis. Electronically Signed   By: Aletta Edouard M.D.   On: 08/20/2017 11:08   Dg Chest Port 1 View  Result Date: 08/19/2017 CLINICAL DATA:  Endotracheal tube is assessment EXAM: PORTABLE CHEST 1 VIEW COMPARISON:  August 18, 2017 FINDINGS: The heart size and mediastinal contours are stable. Endotracheal tube is identified  with distal tip 4.5 cm from carina in good position. A nasogastric tube is identified with distal tip not included on film but is at least in the stomach. A right central venous line is identified with distal tip in the superior vena cava, unchanged. Left chest tube is unchanged. There is pulmonary vascular congestion. Probable left pleural effusion is identified. Consolidation of left lung base is unchanged. The visualized skeletal structures are stable. IMPRESSION: Endotracheal tube is identified with distal tip 4.5 cm from carina, in good position. Pulmonary vascular congestion of the lungs. There is probable left pleural effusion with consolidation of left lung base unchanged compared prior exam. Electronically Signed   By: Abelardo Diesel M.D.   On: 08/19/2017 09:32   Dg Chest Port 1 View  Result Date: 08/18/2017 CLINICAL DATA:  Pneumothorax image timed for 1100hrs.  OG placement. EXAM: PORTABLE CHEST 1 VIEW COMPARISON:  08/18/2017 FINDINGS: Endotracheal tube is in place with tip approximately 3.5 centimeters above the carina. An orogastric tube is in place, tip off the image beyond the gastroesophageal junction. The patient has a RIGHT-sided Port-A-Cath with tip to level of superior vena cava. The patient has a LEFT-sided  small bore chest tube. There is no pneumothorax. The heart is enlarged. Patient is rotated towards the LEFT. There is dense opacity at the LEFT lung base which obscures the LEFT hemidiaphragm. Mild subsegmental atelectasis in the RIGHT LOWER lobe, stable in appearance. IMPRESSION: 1. No pneumothorax. 2. Persistent dense LEFT LOWER lobe opacity. 3. Orogastric tube tip beyond the gastroesophageal junction. Electronically Signed   By: Nolon Nations M.D.   On: 08/18/2017 11:31   Dg Chest Port 1 View  Result Date: 08/18/2017 CLINICAL DATA:  Pneumothorax.  Shortness of breath. EXAM: PORTABLE CHEST 1 VIEW COMPARISON:  One-view chest x-ray 08/17/2017. FINDINGS: Endotracheal tube is stable.  Right IJ catheter is stable. A left-sided chest tube is in place. No visible pneumothorax is present. Basilar atelectasis is present. Mild pulmonary vascular congestion is noted. IMPRESSION: 1. No significant pneumothorax with left-sided chest tube in place. 2. Left basilar airspace opacity likely representing small effusion and atelectasis. 3. Support apparatus is stable. Electronically Signed   By: San Morelle M.D.   On: 08/18/2017 07:07   Dg Chest Port 1 View  Result Date: 08/17/2017 CLINICAL DATA:  77 year old female with history of chest tube in the left side. EXAM: PORTABLE CHEST 1 VIEW COMPARISON:  Chest x-ray 08/17/2017. FINDINGS: Compared to the prior examination there has been interval placement of a small bore left chest tube with tip in the apex of the left hemithorax. Previously noted left-sided pneumothorax is significantly decreased in size compared to the prior examination, now likely less than 5-10% of the volume of the left hemithorax. Right internal jugular single-lumen porta cath with tip terminating near the superior cavoatrial junction. Patient is intubated, with the tip of the endotracheal tube approximately 3 cm above the carina. Lung volumes are low. Persistent elevation of the right hemidiaphragm. Bibasilar linear opacities and linear opacities in the left mid lung, likely to reflect areas of subsegmental atelectasis and/or scarring. No definite consolidative airspace disease. No pleural effusions. No evidence of pulmonary edema. Heart size appears borderline enlarged. The patient is rotated to the left on today's exam, resulting in distortion of the mediastinal contours and reduced diagnostic sensitivity and specificity for mediastinal pathology. Aortic atherosclerosis. IMPRESSION: 1. Support apparatus, as above. 2. Regression of previously noted left-sided pneumothorax following chest tube placement, now extremely small. 3. Low lung volumes with bibasilar areas of subsegmental  atelectasis and/or scarring. Electronically Signed   By: Vinnie Langton M.D.   On: 08/17/2017 20:41   Dg Chest Port 1 View  Result Date: 08/17/2017 CLINICAL DATA:  Difficult intubation.  Orogastric tube placement. EXAM: PORTABLE CHEST 1 VIEW COMPARISON:  Portable chest x-ray of Jun 09, 2017 FINDINGS: The endotracheal tube tip projects 1-2 cm above the carina. There is considerable subcutaneous emphysema however. There is a 10% or less left pneumothorax. There is subsegmental atelectasis in the right lower lung and in the left mid lung. There is subcutaneous emphysema in the left axillary region and bilaterally at the base of the neck. The heart is normal in size. The pulmonary vascularity is not engorged. The porta catheter tip projects over the midportion of the SVC. No esophagogastric tube is observed. IMPRESSION: There is a 10% or less left sided pneumothorax. There is a large amount of subcutaneous emphysema and pneumomediastinum and a small amount of pneumopericardium. The endotracheal tube tip projects approximately 1-2 cm above the carina. Subsegmental atelectasis in both lungs. No CHF.  There is calcification in the wall of the aortic arch. These results will be called  to the ordering clinician or representative by the Radiologist Assistant, and communication documented in the PACS or zVision Dashboard. Electronically Signed   By: David  Martinique M.D.   On: 08/17/2017 14:37   Dg Abd Portable 1v  Result Date: 08/23/2017 CLINICAL DATA:  Nasogastric tube placement. EXAM: PORTABLE ABDOMEN - 1 VIEW COMPARISON:  08/18/2017. FINDINGS: Normal bowel gas pattern. No nasogastric tube seen. Mild scoliosis and lower lumbar spine fixation hardware. IMPRESSION: No nasogastric tube visualized. Electronically Signed   By: Claudie Revering M.D.   On: 08/23/2017 11:19   Dg Abd Portable 1v  Result Date: 08/18/2017 CLINICAL DATA:  Pneumothorax image timed for 1100hrs.  OG placement. EXAM: PORTABLE ABDOMEN - 1 VIEW  COMPARISON:  CT of the abdomen and pelvis on 08/17/2017 FINDINGS: Orogastric tube has been placed, tip overlying the level of the proximal stomach. Patient is rotated towards the LEFT. There is persistent patchy density at the LEFT lung base. IMPRESSION: Orogastric tube tip to level of the stomach. LEFT LOWER lobe opacity. Electronically Signed   By: Nolon Nations M.D.   On: 08/18/2017 11:32   Dg Abd Portable 1v  Result Date: 08/17/2017 CLINICAL DATA:  Pre orogastric tube placement radiograph. EXAM: PORTABLE ABDOMEN - 1 VIEW COMPARISON:  Abdominal and pelvic CT scan of May 15, 2017 FINDINGS: There is a moderate amount of gas and stool within the pelvis. The patient has undergone previous lower lumbar fusion procedure. There numerous tubes and catheters overlying the lower pelvis. There is pneumomediastinum and pneumopericardium and likely hydropneumothorax on the left. IMPRESSION: The bowel gas pattern is within the limits of normal. Please see the accompanying chest x-ray dictation regarding the left-sided pneumothorax, pneumomediastinum, and pneumopericardium. Electronically Signed   By: David  Martinique M.D.   On: 08/17/2017 14:39   Dg Swallowing Func-speech Pathology  Result Date: 08/27/2017 Objective Swallowing Evaluation: Type of Study: MBS-Modified Barium Swallow Study  Patient Details Name: [name] [name] MRN: [id number] Date of Birth: 08-12-1940 Today's Date: 08/27/2017 Time: SLP Start Time (ACUTE ONLY): 1500 -SLP Stop Time (ACUTE ONLY): 1520 SLP Time Calculation (min) (ACUTE ONLY): 20 min Past Medical History: Past Medical History: Diagnosis Date . Allergic rhinitis, cause unspecified  . Benign neoplasm of colon  . Chronic diastolic CHF (congestive heart failure) ([place])  . CVA (cerebral vascular accident) ([place]) 05/2017 . Esophageal reflux  . Excessive daytime sleepiness  . 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 GI hemorrhage 2011  recurrent . Hyperlipidemia  . Osteoporosis  . Polymyositis ([place])  .  Unspecified essential hypertension  . Vitamin D deficiency  Past Surgical History: Past Surgical History: Procedure Laterality Date . ABDOMINAL HYSTERECTOMY    partial . BACK SURGERY   . COLONOSCOPY  01/09/2011  others also . COLONOSCOPY  05/29/2011  Procedure: COLONOSCOPY;  Surgeon: Gatha Mayer, MD;  Location: [facility] ENDOSCOPY;  Service: Endoscopy;  Laterality: N/A;  Greggory Brandy Carlean Purl . COLONOSCOPY WITH PROPOFOL N/A 02/16/2014  Procedure: COLONOSCOPY WITH PROPOFOL;  Surgeon: Milus Banister, MD;  Location: [facility] ENDOSCOPY;  Service: Endoscopy;  Laterality: N/A; . ESOPHAGOGASTRODUODENOSCOPY  12/08/2010  others also . ESOPHAGOGASTRODUODENOSCOPY N/A 08/17/2017  Procedure: ESOPHAGOGASTRODUODENOSCOPY (EGD);  Surgeon: Jackquline Denmark, MD;  Location: [facility];  Service: Endoscopy;  Laterality: N/A; . ESOPHAGOGASTRODUODENOSCOPY (EGD) WITH PROPOFOL N/A 02/16/2014  Procedure: ESOPHAGOGASTRODUODENOSCOPY (EGD) WITH PROPOFOL;  Surgeon: Milus Banister, MD;  Location: [facility] ENDOSCOPY;  Service: Endoscopy;  Laterality: N/A; . EUS   . HOT HEMOSTASIS  05/29/2011  Procedure: HOT HEMOSTASIS (ARGON PLASMA COAGULATION/BICAP);  Surgeon:  Gatha Mayer, MD;  Location: [facility];  Service: Endoscopy;  Laterality: N/A; . LUMBAR LAMINECTOMY   . ROTATOR CUFF REPAIR Bilateral  . TONSILLECTOMY  age 77 HPI: Pt is a 18yoF with hx Polymyositis, CVA, Gastric leiomyoma, HTN, GERD, and dCHF, who presented to ER with BRBPR, hypotension, and AMS. Difficult intubation 8/5, extubated 8/8. EGD showed no signs of bleeding. CXR post-intubation showed L PTX. CT Head negative. BSE 06/13/17 recommended Dys 3 diet and thin liquids, advanced to regular textures prior to d/c. MBS in May 2019 showed an overall functional swallow with a single incident of flash penetration. Pt was noted to cough during MBS but not in the setting of airway compromise. There was also concern for a prominent CP and a possible Zenker's diverticulum developing, but no backflow was observed. Dys 3 diet  and thin liquids were recommended at that time.  Subjective: Pt seen in radiology for MBS to detemine appropriateness for any po intake or therapeutic feeds with speech therapy Assessment / Plan / Recommendation CHL IP CLINICAL IMPRESSIONS 08/27/2017 Clinical Impression Pt presents with severe oral, moderate pharyngeal dysphagia, characterized as follows: ORALLY, pt exhibits minimal labial movement/seal, significantly poor oral prep and propulsion, with anterior leakage, oral holding, and poor bolus formation noted across consistencies. Nectar thick liquid was suctioned from pt oral cavity, as no posterior propulsion or swallow occurred. Poor bolus control and posterior propulsion also noted with thin liquid and puree trials, with post-swallow oral residue seen. PHARYNGEALLY, pt exhibits delayed swallow reflex, with trigger noted at the pyriform sinus. Aspiration of thin liquid via straw was noted during the swallow, with minimal and ineffective cough response elicited. No penetration or aspiration of puree consistency was noted, however, pt is at significantly high risk given poor oral prep and delay of reflex/airway closure. At this time, strict NPO status is recommended for primary nutrition, hydration, and medications. When alert and participatory, pt may benefit from therapeutic trials of individual ice chips and small boluses of puree during dysphagia therapy with speech pathology only. SLP will follow for trial of therapy and education. RN and MD informed of results and recommendations.  SLP Visit Diagnosis Dysphagia, oropharyngeal phase (R13.12) Impact on safety and function Severe aspiration risk;Risk for inadequate nutrition/hydration   CHL IP TREATMENT RECOMMENDATION 08/27/2017 Treatment Recommendations Therapy as outlined in treatment plan below   Prognosis 08/27/2017 Prognosis for Safe Diet Advancement Fair Barriers to Reach Goals Cognitive deficits CHL IP DIET RECOMMENDATION 08/27/2017 SLP Diet  Recommendations NPO;Alternative means - long-term;Other (Comment) Medication Administration Via alternative means   CHL IP OTHER RECOMMENDATIONS 08/27/2017 Oral Care Recommendations Oral care QID Other Recommendations Have oral suction available   CHL IP FOLLOW UP RECOMMENDATIONS 08/27/2017 Follow up Recommendations Skilled Nursing facility;24 hour supervision/assistance   CHL IP FREQUENCY AND DURATION 08/27/2017 Speech Therapy Frequency (ACUTE ONLY) min 2x/week Treatment Duration 2 weeks      CHL IP ORAL PHASE 08/27/2017 Oral Phase Impaired Oral - Nectar Teaspoon Right anterior bolus loss;Impaired mastication;Lingual pumping;Reduced posterior propulsion;Holding of bolus;Right pocketing in lateral sulci;Left pocketing in lateral sulci;Pocketing in anterior sulcus;Lingual/palatal residue;Piecemeal swallowing;Delayed oral transit;Decreased bolus cohesion;Premature spillage Oral - Thin Straw Right anterior bolus loss;Lingual pumping;Reduced posterior propulsion;Holding of bolus;Right pocketing in lateral sulci;Left pocketing in lateral sulci;Pocketing in anterior sulcus;Lingual/palatal residue;Piecemeal swallowing;Delayed oral transit;Decreased bolus cohesion;Premature spillage Oral - Puree Impaired mastication;Weak lingual manipulation;Lingual pumping;Reduced posterior propulsion;Holding of bolus;Right pocketing in lateral sulci;Left pocketing in lateral sulci;Pocketing in anterior sulcus;Lingual/palatal residue;Piecemeal swallowing;Delayed oral transit;Decreased bolus cohesion;Premature spillage  CHL IP  PHARYNGEAL PHASE 08/27/2017 Pharyngeal Phase Impaired Pharyngeal- Nectar Teaspoon No swallow elicited - nectar thick material was suctioned from oral cavity.  Pharyngeal- Thin Straw Delayed swallow initiation-pyriform sinuses;Reduced laryngeal elevation;Reduced airway/laryngeal closure;Reduced tongue base retraction;Penetration/Aspiration during swallow;Moderate aspiration;Pharyngeal residue - valleculae Pharyngeal  Material enters airway, passes BELOW cords and not ejected out despite cough attempt by patient - cough was weak and ineffective in removing aspirate. Pharyngeal- Puree Delayed swallow initiation-pyriform sinuses;Reduced anterior laryngeal mobility;Reduced laryngeal elevation;Reduced tongue base retraction;Pharyngeal residue - valleculae  CHL IP CERVICAL ESOPHAGEAL PHASE 08/27/2017 Cervical Esophageal Phase [facility] B[facility] Qu[facility] O[facility], CCC-SLP Speech Language Pathologist [phone] Shonna Chock 08/27/2017, 4:09 PM              Ct Angio Abd/pel W/ And/or W/o  Result Date: 08/17/2017 CLINICAL DATA:  Gastrointestinal bleeding EXAM: CTA ABDOMEN AND PELVIS wITHOUT AND WITH CONTRAST TECHNIQUE: Multidetector CT imaging of the abdomen and pelvis was performed using the standard protocol during bolus administration of intravenous contrast. Multiplanar reconstructed images and MIPs were obtained and reviewed to evaluate the vascular anatomy. CONTRAST:  174mL ISOVUE-370 IOPAMIDOL (ISOVUE-370) INJECTION 76% COMPARISON:  05/15/2017 FINDINGS: VASCULAR Gastrointestinal bleeding: There is no blush of contrast within the gastrointestinal tract to suggest active gastrointestinal bleeding. Aorta: Nonaneurysmal and patent. Scattered atherosclerotic calcifications. Celiac: Severe narrowing just beyond the origin. Branch vessels patent. SMA: Patent and diminutive. Renals: Bilateral single renal arteries are patent. IMA: Diminutive and patent. Inflow: Common, internal, and external iliac arteries are patent. Proximal Outflow: Grossly patent Veins: No obvious DVT. Review of the MIP images confirms the above findings. NON-VASCULAR Lower chest: There is consolidation at the left lung base. A large left pneumothorax is present. There is a large amount of mediastinal emphysema. There is a tiny right anterior pneumothorax. There is emphysema in the anterior chest soft tissues. Hepatobiliary: Tiny hypodensity in the right lobe of the liver  towards the dome. It is stable. Gallbladder is within normal limits. Pancreas: Unremarkable Spleen: Unremarkable Adrenals/Urinary Tract: Benign cysts are present in both kidneys. There is high-density material in the collecting system of both kidneys which conforms to the collecting system likely representing a test dose of contrast with excretion. Adrenal glands are within normal limits. Foley catheter decompresses the bladder. There is bladder wall thickening. Stomach/Bowel: Stomach and duodenum are decompressed. No evidence of small-bowel obstruction. Sigmoid diverticulosis is present. There is no evidence of acute diverticulitis. Lymphatic: No abnormal retroperitoneal adenopathy. Reproductive: Uterus is absent.  Adnexa are within normal limits. Other: No free fluid. Musculoskeletal: Postoperative changes from posterior L4-5 fusion. L4 superior anterior endplate compression fracture is stable. IMPRESSION: VASCULAR No evidence of gastrointestinal bleeding. Severe narrowing just beyond the origin of the celiac axis. NON-VASCULAR Consolidation at the left lung base. Large left pneumothorax. Tiny right pneumothorax. Extensive pneumomediastinum. Postoperative changes in the lumbar spine. Stable L4 compression deformity. Findings related to the left pneumothorax were discussed with Dr. Debbora Dus. Electronically Signed   By: Marybelle Killings M.D.   On: 08/17/2017 18:47    Microbiology: No results found for this or any previous visit (from the past 240 hour(s)).   Labs: Basic Metabolic Panel: Recent Labs  Lab 09/02/17 1157 09/03/17 0323 09/04/17 0422  NA 142 138 137  K 3.0* 3.7 4.1  CL 107 104 105  CO2 26 27 25   GLUCOSE 179* 86 138*  BUN 19 12 13   CREATININE 0.44 0.39* 0.36*  CALCIUM 8.0* 8.1* 8.4*  MG 2.1  --   --    Liver Function Tests:  Recent Labs  Lab 09/02/17 1157  AST 27  ALT 36  ALKPHOS 50  BILITOT 0.7  PROT 4.5*  ALBUMIN 2.3*   No results for input(s): LIPASE, AMYLASE in the last  168 hours. No results for input(s): AMMONIA in the last 168 hours. CBC: Recent Labs  Lab 09/03/17 0323 09/04/17 0422  WBC 11.4* 10.5  HGB 10.0* 11.2*  HCT 32.1* 35.3*  MCV 86.1 85.3  PLT 317 382   Cardiac Enzymes: No results for input(s): CKTOTAL, CKMB, CKMBINDEX, TROPONINI in the last 168 hours. BNP: BNP (last 3 results) No results for input(s): BNP in the last 8760 hours.  ProBNP (last 3 results) No results for input(s): PROBNP in the last 8760 hours.  CBG: Recent Labs  Lab 09/08/17 1656 09/08/17 2029 09/09/17 0013 09/09/17 0423 09/09/17 0759  GLUCAP 88 107* 103* 125* 112*       Signed:  Phillips Climes MD.  [facility] 09/09/2017, 11:08 AM

## 2017-09-07 NOTE — Clinical Social Work Note (Signed)
Patient will discharge back to [facility] pending insurance authorization. Updated PT/OT notes sent to facility today to forward to insurance company. CSW will continue to follow and facilitate discharge back to [facility] once [place]. Received from [facility]  Adron Geisel Givens, MSW, LCSW Licensed Clinical Social Worker [employer] [phone]

## 2017-09-07 NOTE — Evaluation (Signed)
Occupational Therapy Evaluation Patient Details Name: [name] [name] MRN: [id number] DOB: 18-Nov-1940 Today's Date: 09/07/2017    History of Present Illness 77 y.o. female admitted on 08/17/17 for rectal bleeding.  Pt dx with GIB with hematochezia (s/p EGD with no signs of UGIB s/p 2 units PRBCs), syncope related to possible hemorrhagic shock vs PTX vs sepsis requiring intubation 08/17/17-08/20/17.  PTX treated with L chest tube 8/5-08/21/17.  Pt with other significant PMH of polymyositis, CVA, chronic diastolic heart failure, bil RTC repair, and lumbar laminectomy.   Clinical Impression   This 77 yo female admitted with above presents to acute OT with decreased balance, decreased command following/delayed command following, decreased mobility, pushing with LUE to right in sitting, tendency of head turned to right all affecting safety and ability to participate in basic ADLs. She will benefit from acute OT with follow up OT at SNF.    Follow Up Recommendations  SNF;Supervision/Assistance - 24 hour    Equipment Recommendations  None recommended by OT       Precautions / Restrictions Precautions Precautions: Fall Precaution Comments: right side weaker than left Restrictions Weight Bearing Restrictions: No      Mobility Bed Mobility Overal bed mobility: Needs Assistance Bed Mobility: Rolling;Sidelying to Sit Rolling: Total assist Sidelying to sit: +2 for physical assistance;Total assist       General bed mobility comments: minimal initiation  Transfers Overall transfer level: Needs assistance Equipment used: 2 person hand held assist(bilateral support at gait belt/bed pad) Transfers: Squat Pivot Transfers     Squat pivot transfers: +2 physical assistance;Total assist     General transfer comment: pivot transfer bed to recliner toward pt's R side. Knees blocked for stability. Use of bed pads under bottom for stability during pivot.    Balance Overall balance assessment: Needs  assistance Sitting-balance support: Feet supported;Single extremity supported;Bilateral upper extremity supported Sitting balance-Leahy Scale: (Fluctuated between fari and Zero) Sitting balance - Comments: Level of assist varied; at times, min/minguard assist; noted tendency to push with RUE toward L side; owrked on R weight shift, lean, and R elbow propping; at times, required Total assist to prevent fall forward as pt would sporadically heavily flex trunk                                   ADL either performed or assessed with clinical judgement   ADL Overall ADL's : Needs assistance/impaired                                       General ADL Comments: Total A for all basic ADLs. Did hand pt a washcloth to wipe her mouth and tried to A hand over hand with RUE (pt brought mouth to hand not hand to mouth (totally loosing balance forward)     Vision   Additional Comments: Unable to test due to pt cannot follow commands to do so. Pt with tendency to look right, but will look left with head and eyes when dtr calls her name and is standing on left side            Pertinent Vitals/Pain Pain Assessment: Faces Faces Pain Scale: Hurts little more Pain Location: Generally Grimacing with movement; Daughter reports that pt held her hand and placed daughter's hand on pt's L  thigh, in a type of request to  rub her leg Pain Descriptors / Indicators: Grimacing;Moaning Pain Intervention(s): Monitored during session;Repositioned     Hand Dominance Right   Extremity/Trunk Assessment Upper Extremity Assessment Upper Extremity Assessment: LUE deficits/detail;RUE deficits/detail RUE Deficits / Details: Pt not using/moving RUE as much as LUE and not to command at all and movements are GM not FM LUE Deficits / Details: Pushing with LUE in sitting           Communication Communication Communication: (mainly not speaking, will answer yes/no inconsistently)   Cognition  Arousal/Alertness: Awake/alert Behavior During Therapy: Flat affect Overall Cognitive Status: Difficult to assess Area of Impairment: Following commands;Attention;Problem solving                 Orientation Level: (minimally verbal; unable to get answers to orientation) Current Attention Level: Focused   Following Commands: Follows one step commands inconsistently;Follows one step commands with increased time     Problem Solving: Slow processing;Decreased initiation;Difficulty sequencing;Requires tactile cues;Requires verbal cues General Comments: Turns head to respond to her name; briefly makes eye contact; Extended L wrist and fingers with incr time when asked to "high  five"; noted verbalization of "bye" at end of session              [place] expects to be discharged to:: Skilled nursing facility                                 Additional Comments: From SNF, chart reports WC bound at baseline, unsure of if she was assisting with transfers to [facility] or being lifted.      Prior Functioning/Environment Level of Independence: Needs assistance                 OT Problem List: Decreased strength;Decreased range of motion;Impaired balance (sitting and/or standing);Decreased safety awareness;Decreased cognition;Decreased coordination;Impaired vision/perception;Impaired tone;Impaired UE functional use      OT Treatment/Interventions: Self-care/ADL training;Balance training;DME and/or AE instruction;Patient/family education;Therapeutic activities    OT Goals(Current goals can be found in the care plan section) Acute Rehab OT Goals Patient Stated Goal: Unable to state OT Goal Formulation: Patient unable to participate in goal setting Time For Goal Achievement: 09/21/17 Potential to Achieve Goals: Fair  OT Frequency: Min 2X/week   Barriers to D/C:            Co-evaluation PT/OT/SLP Co-Evaluation/Treatment: Yes Reason for Co-Treatment:  Necessary to address cognition/behavior during functional activity;For patient/therapist safety PT goals addressed during session: Mobility/safety with mobility OT goals addressed during session: Strengthening/ROM      AM-PAC PT "6 Clicks" Daily Activity     Outcome Measure Help from another person eating meals?: Total Help from another person taking care of personal grooming?: Total Help from another person toileting, which includes using toliet, bedpan, or urinal?: Total Help from another person bathing (including washing, rinsing, drying)?: Total Help from another person to put on and taking off regular upper body clothing?: Total Help from another person to put on and taking off regular lower body clothing?: Total 6 Click Score: 6   End of Session Equipment Utilized During Treatment: Gait belt Nurse Communication: Mobility status;Need for lift equipment  Activity Tolerance: Patient tolerated treatment well Patient left: in chair;with call bell/phone within reach;with chair alarm set  OT Visit Diagnosis: Other abnormalities of gait and mobility (R26.89);Muscle weakness (generalized) (M62.81);Hemiplegia and hemiparesis;Other symptoms and signs involving cognitive function Hemiplegia - Right/Left: Right  Time: 1201-1232 OT Time Calculation (min): 31 min Charges:  OT General Charges $OT Visit: 1 Visit OT Evaluation $OT Eval Moderate Complexity: [address] [place] [phone] 09/07/2017

## 2017-09-07 NOTE — Progress Notes (Signed)
Physical Therapy Treatment Patient Details Name: [name] [name] MRN: [id number] DOB: 1940/04/08 Today's Date: 09/07/2017    History of Present Illness 77 y.o. female admitted on 08/17/17 for rectal bleeding.  Pt dx with GIB with hematochezia (s/p EGD with no signs of UGIB s/p 2 units PRBCs), syncope related to possible hemorrhagic shock vs PTX vs sepsis requiring intubation 08/17/17-08/20/17.  PTX treated with L chest tube 8/5-08/21/17.  Pt with other significant PMH of polymyositis, CVA, chronic diastolic heart failure, bil RTC repair, and lumbar laminectomy.    PT Comments    Continuing work on functional mobility and activity tolerance;  Small, but notable improvements in following commands and participation; Tolerated more time sitting EOB this session as well; REcommend continuing therapy services   Follow Up Recommendations  SNF     Equipment Recommendations  Hospital bed;Wheelchair cushion (measurements PT);Wheelchair (measurements PT);Other (comment)    Recommendations for Other Services OT consult(as ordered)     Precautions / Restrictions Precautions Precautions: Fall Precaution Comments: right side weaker than left    Mobility  Bed Mobility Overal bed mobility: Needs Assistance Bed Mobility: Rolling;Sidelying to Sit Rolling: Total assist Sidelying to sit: +2 for physical assistance;Total assist       General bed mobility comments: minimal initiation  Transfers Overall transfer level: Needs assistance Equipment used: 2 person hand held assist(bilateral support at gait belt/bed pad) Transfers: Squat Pivot Transfers     Squat pivot transfers: +2 physical assistance;Total assist     General transfer comment: pivot transfer bed to recliner toward pt's R side. Knees blocked for stability. Use of bed pads under bottom for stability during pivot.  Ambulation/Gait                 Stairs             Wheelchair Mobility    Modified Rankin (Stroke  Patients Only)       Balance Overall balance assessment: Needs assistance Sitting-balance support: Feet supported;Single extremity supported;Bilateral upper extremity supported Sitting balance-Leahy Scale: (Fluctuated between fari and Zero) Sitting balance - Comments: Level of assist varied; at times, min/minguard assist; noted tendency to push with RUE toward L side; owrked on R weight shift, lean, and R elbow propping; at times, required Total assist to prevent fall forward as pt would sporadically heavily flex trunk                                    Cognition Arousal/Alertness: Awake/alert Behavior During Therapy: Flat affect Overall Cognitive Status: Difficult to assess Area of Impairment: Following commands;Attention;Problem solving                 Orientation Level: (minimally verbal; unable to get answers to orientation questions) Current Attention Level: Focused   Following Commands: Follows one step commands inconsistently;Follows one step commands with increased time     Problem Solving: Slow processing;Decreased initiation;Difficulty sequencing;Requires tactile cues;Requires verbal cues General Comments: Turns head to respond to her name; briefly makes eye contact; Extended L wrist and fingers with incr time when asked to "high  five"; noted verbalization of "bye" at end of session      Exercises      General Comments        Pertinent Vitals/Pain Pain Assessment: Faces Faces Pain Scale: Hurts little more Pain Location: Generally Grimacing with movement; Daughter reports that pt held her hand and placed daughter's hand on pt's  L  thigh, in a type of request to rub her leg Pain Descriptors / Indicators: Grimacing;Moaning Pain Intervention(s): Monitored during session;Repositioned    Home Living                      Prior Function            PT Goals (current goals can now be found in the care plan section) Acute Rehab PT  Goals Patient Stated Goal: Unable to state PT Goal Formulation: Patient unable to participate in goal setting Time For Goal Achievement: 09/17/17 Potential to Achieve Goals: Fair Progress towards PT goals: Progressing toward goals(slowly)    Frequency    Min 2X/week      PT Plan Current plan remains appropriate    Co-evaluation PT/OT/SLP Co-Evaluation/Treatment: Yes Reason for Co-Treatment: Necessary to address cognition/behavior during functional activity;For patient/therapist safety;To address functional/ADL transfers PT goals addressed during session: Mobility/safety with mobility        AM-PAC PT "6 Clicks" Daily Activity  Outcome Measure  Difficulty turning over in bed (including adjusting bedclothes, sheets and blankets)?: Unable Difficulty moving from lying on back to sitting on the side of the bed? : Unable Difficulty sitting down on and standing up from a chair with arms (e.g., wheelchair, bedside commode, etc,.)?: Unable Help needed moving to and from a bed to chair (including a wheelchair)?: Total Help needed walking in hospital room?: Total Help needed climbing 3-5 steps with a railing? : Total 6 Click Score: 6    End of Session Equipment Utilized During Treatment: Gait belt;Other (comment)(bed pad) Activity Tolerance: Patient tolerated treatment well Patient left: in chair;with call bell/phone within reach;with chair alarm set;with family/visitor present Nurse Communication: Mobility status;Need for lift equipment PT Visit Diagnosis: Muscle weakness (generalized) (M62.81);Difficulty in walking, not elsewhere classified (R26.2);Other symptoms and signs involving the nervous system (R29.898)     Time: 7619-5093 PT Time Calculation (min) (ACUTE ONLY): 34 min  Charges:  $Therapeutic Activity: 8-22 mins                     Roney Marion, PT  Acute Rehabilitation Services Pager [phone] Office [place] 09/07/2017, 1:33 PM

## 2017-09-07 NOTE — Progress Notes (Signed)
SLP Cancellation Note  Patient Details Name: [name] [name] MRN: [id number] DOB: Jan 31, 1940   Cancelled treatment:       Reason Eval/Treat Not Completed: Patient unavailable - PT/OT treatment. Will continue efforts.  Trevor Duty B. Quentin Ore [facility], CCC-SLP Speech Language Pathologist [phone]  Shonna Chock 09/07/2017, 12:00 PM

## 2017-09-08 LAB — GLUCOSE, CAPILLARY
GLUCOSE-CAPILLARY: 105 mg/dL — AB (ref 70–99)
GLUCOSE-CAPILLARY: 95 mg/dL (ref 70–99)
GLUCOSE-CAPILLARY: 107 mg/dL — AB (ref 70–99)
Glucose-Capillary: 138 mg/dL — ABNORMAL HIGH (ref 70–99)
Glucose-Capillary: 88 mg/dL (ref 70–99)

## 2017-09-08 NOTE — Progress Notes (Signed)
  Speech Language Pathology Treatment: Dysphagia  Patient Details Name: [name] [name] MRN: [id number] DOB: 14-Apr-1940 Today's Date: 09/08/2017 Time: 7711-6579 SLP Time Calculation (min) (ACUTE ONLY): 8 min  Assessment / Plan / Recommendation Clinical Impression  Treatment was brief due to pt moaning, not following commands, and not initiating posterior transfer or pharyngeal swallow despite Max cues. Minimal amounts of POs were offered with minimal labial/lingual response to presentation, then with no attempts to actively manipulate boluses. Cognitively she remains unsafe for PO intake. Will continue to follow acutely to attempt dysphagia therapy.   HPI HPI: Pt is a 14yoF with hx Polymyositis, CVA, Gastric leiomyoma, HTN, GERD, and dCHF, who presented to ER with BRBPR, hypotension, and AMS. Difficult intubation 8/5, extubated 8/8. EGD showed no signs of bleeding. CXR post-intubation showed L PTX. CT Head negative. BSE 06/13/17 recommended Dys 3 diet and thin liquids, advanced to regular textures prior to d/c. MBS in May 2019 showed an overall functional swallow with a single incident of flash penetration. Pt was noted to cough during MBS but not in the setting of airway compromise. There was also concern for a prominent CP and a possible Zencker's diverticulum developing, but no backflow was observed. Dys 3 diet and thin liquids were recommended at that time.      SLP Plan  Continue with current plan of care       Recommendations  Diet recommendations: NPO Medication Administration: Via alternative means                Oral Care Recommendations: Oral care QID Follow up Recommendations: Skilled Nursing facility;24 hour supervision/assistance SLP Visit Diagnosis: Dysphagia, oropharyngeal phase (R13.12) Plan: Continue with current plan of care       GO                Germain Osgood 09/08/2017, 5:17 PM  Germain Osgood, M.A. CCC-SLP [phone]

## 2017-09-08 NOTE — Progress Notes (Signed)
Paged Dr. Waldron Labs, pts daughter would like mother to have something other than tylenol than pain.

## 2017-09-08 NOTE — Progress Notes (Signed)
PROGRESS NOTE    [name] [name]  NAT:[id number] DOB: 11/21/1940 DOA: 08/17/2017 PCP: [name], MD    Brief Narrative:  77yo female SNF resident with a hx of polymyositis, CVA with complete right hemiparesis, contracted, gastric leiomyoma, hypertension, GERD, and diastolic CHF who was brought to the ED w/ bright red blood per rectum. She had episodes of hypotension and unresponsiveness in the ED and required intubation. She was transfused 2 unit of PRBC transfusion.  She was extubated on 8/8.  subsequently transferred from ICU to hospitalist service. -Gastroenterology was consulted, underwent EGD.  CT abdomen and pelvis without any acute abnormality.  Aspirin Plavix have been on hold.  Considering patient being contracted, no further endoscopic work-up is planned.  Patient had few other episodes of bloody stools, however improved.  Patient failed swallow evaluation multiple times.  Dobbhoff tube is placed.  s/p palliative care meeting on 08/30/2017 for further decide about goals of care.  pt is DNR.  -family decided to pursue PEG tube, IR consulted, status post PEG 8/21   Assessment & Plan:   Hemorrhagic shock: -resolved -Suspected to be secondary to diverticular bleed versus recurrent colonic AVM -Gastroenterology was consulted, EGD on 8/15 by Dr. Lyndel Safe negative for upper GI source of bleeding, due to overall very poor prognosis and contractures no plans to pursue colonoscopic evaluation, no significant ongoing GI bleed at this time -CT abdomen and pelvis was negative for etiology of acute bleed as well -Aspirin and Plavix on hold -stable, no active bleeding   Acute respiratory failure with hypoxia:  -patient was unresponsive in the emergency room in the setting of hemorrhagic shock  -Intubated 8/5, also treated for aspiration pneumonia, noted to have worsening dysphagia -She was extubated on 8/9 -Completed antibiotic course -Respiratory failure has resolved weaned down to room air  now, however continues to be high aspiration risk  Encephalopathy with Dysphagia: -Secondary to hemorrhagic shock with hypoperfusion, aspiration pneumonia and prior CVA with atrophy -prior to this admission was reportedly interactive, able to carry conversation, following extubation has been noted to be nonverbal, and had worsening dysphagia -She was seen by neurology on admission, underwent CTA head and neck-this did not show any evidence of large vessel occlusion - Has failed numerous swallow studies, she had a cortrak placed 8/12 after multiple attempts, started tube feeds - Seen by palliative medicine, status post goals of care meeting, now DO NOT RESUSCITATE however family wanted to pursue PEG tube placement despite multiple discussions on poor prognosis -PEG tube placed in interventional radiology on 8/21,started tube feeds 8/22, RD consulting -In terms of her encephalopathy has stopped all sedating medications, EEG was negative for epileptiform discharges and repeat CT head 8/21 without any acute findings -more stable now, she is alert awake but only moans and groans  H/o CVA  -aspirin and Plavix held due to hemorrhagic shock and severe recurrent lower GI bleed  Hypertension: -continue metoprolol to 75 mg BID,  amlodipine 10 mg daily.    H/o polymyositis:  -unable to walk, wheelchair/bed bound at baseline long before her stroke secondary to severe polymyositis, also has chronic adrenal insufficiency.  -continue cellcept and prednisone.   Deconditioning.  Due to CVA AND Polymyositis.  Bed bound with contractures on lower extremities.  -needs SNF at discharge.   Ethics: this is a chronically ill female, with worsening cognition and mental status, recent stroke, worsening dysphagia, ongoing failure to thrive -Seen by palliative medicine, prognosis remains poor -DNR, back to SNF with palliative care  DVT prophylaxis:SCDs Code Status:DNR Family Communication: None at bedside    disposition Plan: SNF proves  Consultants:   Gastroenterology   Palliative care.   PCCM  SLP  Procedures: EGD   Antimicrobials:none.   Subjective: -no changes, no events overnight, tolerating tube feeds,  Objective: Vitals:   09/07/17 0851 09/07/17 1701 09/07/17 2143 09/08/17 0820  BP: (!) 148/89 (!) 160/107 126/73 (!) 154/91  Pulse: (!) 107 (!) 133 (!) 105 (!) 118  Resp: 20 20  18   Temp: 98.1 F (36.7 C)  97.8 F (36.6 C) 98.5 F (36.9 C)  TempSrc: Oral  Oral Oral  SpO2: 100%  (!) 87% (!) 87%  Weight:      Height:        Intake/Output Summary (Last 24 hours) at 09/08/2017 1543 Last data filed at 09/08/2017 0300 Gross per 24 hour  Intake 2139.95 ml  Output -  Net 2139.95 ml   Filed Weights   08/30/17 0609 09/03/17 1940 09/04/17 2134  Weight: 66.2 kg 63.4 kg 59.7 kg   Examination:  Gen: Frail, elderly, debilitated, moaning and groaning Lungs: No wheezing, fair air entry bilaterally CVS: S1-S2/regular rate rhythm  Abd: soft, Non tender, non distended, BS present, PEG tube Extremities: No edema Skin: no new rashes  Data Reviewed: I have personally reviewed following labs and imaging studies  CBC: Recent Labs  Lab 09/02/17 0657 09/03/17 0323 09/04/17 0422  WBC 9.7 11.4* 10.5  HGB 10.6* 10.0* 11.2*  HCT 34.9* 32.1* 35.3*  MCV 88.1 86.1 85.3  PLT 357 317 923   Basic Metabolic Panel: Recent Labs  Lab 09/02/17 1157 09/03/17 0323 09/04/17 0422  NA 142 138 137  K 3.0* 3.7 4.1  CL 107 104 105  CO2 26 27 25   GLUCOSE 179* 86 138*  BUN 19 12 13   CREATININE 0.44 0.39* 0.36*  CALCIUM 8.0* 8.1* 8.4*  MG 2.1  --   --    GFR: Estimated Creatinine Clearance: 46.6 mL/min (A) (by C-G formula based on SCr of 0.36 mg/dL (L)). Liver Function Tests: Recent Labs  Lab 09/02/17 1157  AST 27  ALT 36  ALKPHOS 50  BILITOT 0.7  PROT 4.5*  ALBUMIN 2.3*   No results for input(s): LIPASE, AMYLASE in the last 168 hours. No results for input(s): AMMONIA  in the last 168 hours. Coagulation Profile: Recent Labs  Lab 09/02/17 0657  INR 0.97   Cardiac Enzymes: No results for input(s): CKTOTAL, CKMB, CKMBINDEX, TROPONINI in the last 168 hours. BNP (last 3 results) No results for input(s): PROBNP in the last 8760 hours. HbA1C: No results for input(s): HGBA1C in the last 72 hours. CBG: Recent Labs  Lab 09/07/17 2053 09/07/17 2350 09/08/17 0329 09/08/17 0748 09/08/17 1308  GLUCAP 93 77 105* 95 138*   Lipid Profile: No results for input(s): CHOL, HDL, LDLCALC, TRIG, CHOLHDL, LDLDIRECT in the last 72 hours. Thyroid Function Tests: No results for input(s): TSH, T4TOTAL, FREET4, T3FREE, THYROIDAB in the last 72 hours. Anemia Panel: No results for input(s): VITAMINB12, FOLATE, FERRITIN, TIBC, IRON, RETICCTPCT in the last 72 hours. Sepsis Labs: No results for input(s): PROCALCITON, LATICACIDVEN in the last 168 hours.  No results found for this or any previous visit (from the past 240 hour(s)).   Radiology Studies: No results found. Scheduled Meds: . amLODipine  10 mg Oral Daily  . chlorhexidine  15 mL Mouth Rinse BID  . Chlorhexidine Gluconate Cloth  6 each Topical Daily  . feeding supplement (OSMOLITE 1.5 CAL)  237 mL Per Tube QID  . feeding supplement (PRO-STAT SUGAR FREE 64)  30 mL Per Tube BID  . free water  300 mL Per Tube Q8H  . mouth rinse  15 mL Mouth Rinse q12n4p  . metoprolol tartrate  75 mg Per Tube BID  . mycophenolate  1,000 mg Per Tube BID  . pantoprazole sodium  40 mg Per Tube Daily  . potassium chloride  30 mEq Per Tube Daily  . predniSONE  20 mg Per Tube Q12H  . sodium chloride flush  3 mL Intravenous Q12H   Continuous Infusions:   LOS: 22 days    Phillips Climes, MD [facility] Page via [email or web address] If 7PM-7AM, please contact night-coverage [email or web address] Password [email or web address] 09/08/2017, 3:43 PM

## 2017-09-08 NOTE — Care Management Important Message (Signed)
Important Message  Patient Details  Name: [name] [name] MRN: [id number] Date of Birth: 09-Nov-1940   Medicare Important Message Given:  Yes    [name] 09/08/2017, 3:32 PM

## 2017-09-08 NOTE — Discharge Instructions (Signed)
Follow with Primary MD [name], MD in 7 days   Get CBC, CMP,  checked  by Primary MD next visit.    Activity: As tolerated with Full fall precautions use walker/cane & assistance as needed   Disposition Home    Diet: Heart Healthy  , with feeding assistance and aspiration precautions.  For Heart failure patients - Check your Weight same time everyday, if you gain over 2 pounds, or you develop in leg swelling, experience more shortness of breath or chest pain, call your Primary MD immediately. Follow Cardiac Low Salt Diet and 1.5 lit/day fluid restriction.   On your next visit with your primary care physician please Get Medicines reviewed and adjusted.   Please request your Prim.MD to go over all Hospital Tests and Procedure/Radiological results at the follow up, please get all Hospital records sent to your Prim MD by signing hospital release before you go home.   If you experience worsening of your admission symptoms, develop shortness of breath, life threatening emergency, suicidal or homicidal thoughts you must seek medical attention immediately by calling 911 or calling your MD immediately  if symptoms less severe.  You Must read complete instructions/literature along with all the possible adverse reactions/side effects for all the Medicines you take and that have been prescribed to you. Take any new Medicines after you have completely understood and accpet all the possible adverse reactions/side effects.   Do not drive, operating heavy machinery, perform activities at heights, swimming or participation in water activities or provide baby sitting services if your were admitted for syncope or siezures until you have seen by Primary MD or a Neurologist and advised to do so again.  Do not drive when taking Pain medications.    Do not take more than prescribed Pain, Sleep and Anxiety Medications  Special Instructions: If you have smoked or chewed Tobacco  in the last 2 yrs  please stop smoking, stop any regular Alcohol  and or any Recreational drug use.  Wear Seat belts while driving.   Please note  You were cared for by a hospitalist during your hospital stay. If you have any questions about your discharge medications or the care you received while you were in the hospital after you are discharged, you can call the unit and asked to speak with the hospitalist on call if the hospitalist that took care of you is not available. Once you are discharged, your primary care physician will handle any further medical issues. Please note that NO REFILLS for any discharge medications will be authorized once you are discharged, as it is imperative that you return to your primary care physician (or establish a relationship with a primary care physician if you do not have one) for your aftercare needs so that they can reassess your need for medications and monitor your lab values.

## 2017-09-08 NOTE — Care Management Important Message (Signed)
Important Message  Patient Details  Name: ZAKYRIA METZINGER MRN: [id number] Date of Birth: 08-12-1940   Medicare Important Message Given:  Yes Spoke to dtr, Fay Records via phone. Left form in room for her to review.   Erenest Rasher, RN 09/08/2017, 11:02 AM

## 2017-09-09 LAB — GLUCOSE, CAPILLARY
GLUCOSE-CAPILLARY: 125 mg/dL — AB (ref 70–99)
GLUCOSE-CAPILLARY: 112 mg/dL — AB (ref 70–99)
GLUCOSE-CAPILLARY: 151 mg/dL — AB (ref 70–99)
Glucose-Capillary: 103 mg/dL — ABNORMAL HIGH (ref 70–99)

## 2017-09-09 MED ORDER — HEPARIN SOD (PORK) LOCK FLUSH 100 UNIT/ML IV SOLN
500.0000 [IU] | INTRAVENOUS | Status: AC | PRN
  Administered 2017-09-09: 500 [IU]

## 2017-09-09 MED ORDER — PREDNISONE 20 MG PO TABS: 20.0000 mg | ORAL_TABLET | Freq: Two times a day (BID) | ORAL | Status: DC

## 2017-09-09 MED ORDER — ALPRAZOLAM 0.25 MG PO TABS: 0.2500 mg | ORAL_TABLET | Freq: Two times a day (BID) | ORAL | 0 refills | Status: AC | PRN

## 2017-09-09 MED ORDER — METOPROLOL TARTRATE 25 MG/10 ML ORAL SUSPENSION: 50.0000 mg | Freq: Two times a day (BID) | ORAL | Status: AC

## 2017-09-09 MED ORDER — POTASSIUM CHLORIDE 20 MEQ/15ML (10%) PO SOLN: 10.0000 meq | Freq: Every day | ORAL | 0 refills | Status: AC

## 2017-09-09 NOTE — Progress Notes (Signed)
Patients BP was 103/71 and HR 100, K+ was 4.1 on 8/23, and labs haven't been drawn since. Paged MD. Received orders to hold amlodipine and oral K+ for now.

## 2017-09-09 NOTE — Progress Notes (Signed)
Patient left with transport, revised AVS was faxed to SNF, family notified

## 2017-09-09 NOTE — Progress Notes (Signed)
Report called to Ander Purpura, RN at [facility]

## 2017-09-09 NOTE — Clinical Social Work Note (Signed)
Patient medically stable for discharge and is returning to [facility]. Discharge clinicals transmitted to facility and daughter Glendora Score ([phone]) contacted and advised that transport called. CSW signing off as no other SW intervention services needed.  Devina Bezold Givens, MSW, LCSW Licensed Clinical Social Worker [place] [phone]

## 2017-09-10 ENCOUNTER — Non-Acute Institutional Stay (SKILLED_NURSING_FACILITY): Payer: Medicare Other | Admitting: Internal Medicine

## 2017-09-10 ENCOUNTER — Encounter: Payer: Self-pay | Admitting: Internal Medicine

## 2017-09-10 DIAGNOSIS — J9601 Acute respiratory failure with hypoxia: Secondary | ICD-10-CM

## 2017-09-10 DIAGNOSIS — I10 Essential (primary) hypertension: Secondary | ICD-10-CM

## 2017-09-10 DIAGNOSIS — R197 Diarrhea, unspecified: Secondary | ICD-10-CM

## 2017-09-10 DIAGNOSIS — E2740 Unspecified adrenocortical insufficiency: Secondary | ICD-10-CM

## 2017-09-10 DIAGNOSIS — Z8719 Personal history of other diseases of the digestive system: Secondary | ICD-10-CM

## 2017-09-10 DIAGNOSIS — Z931 Gastrostomy status: Secondary | ICD-10-CM

## 2017-09-10 DIAGNOSIS — B370 Candidal stomatitis: Secondary | ICD-10-CM

## 2017-09-10 DIAGNOSIS — R578 Other shock: Secondary | ICD-10-CM

## 2017-09-10 DIAGNOSIS — K909 Intestinal malabsorption, unspecified: Secondary | ICD-10-CM

## 2017-09-10 DIAGNOSIS — R1310 Dysphagia, unspecified: Secondary | ICD-10-CM

## 2017-09-10 DIAGNOSIS — D62 Acute posthemorrhagic anemia: Secondary | ICD-10-CM | POA: Diagnosis not present

## 2017-09-10 DIAGNOSIS — G931 Anoxic brain damage, not elsewhere classified: Secondary | ICD-10-CM

## 2017-09-10 DIAGNOSIS — M3320 Polymyositis, organ involvement unspecified: Secondary | ICD-10-CM

## 2017-09-10 NOTE — Progress Notes (Signed)
:     Location:  [place] Room Number: 371G Place of Service:  SNF (31)  [name] Goldston D. Sheppard Coil, MD  Patient Care Team: [name], MD as PCP - General (Internal Medicine)  Extended Emergency Contact Information Primary Emergency Contact: [name] Address: [address]          [name], [place] [address] Johnnette Litter of [place] Phone: [phone] Relation: Daughter Secondary Emergency Contact: Shanele, [place] Mobile Phone: [phone] Relation: Son     Allergies: Codeine; Hydrocodone; and Tramadol hcl  Chief Complaint  Patient presents with  . Readmit To SNF    Admit to [facility]    HPI: Patient is 77 y.o. female with polymyositis, CVA with complete right hemiparesis, contractures, gastric leiomyoma, hypertension, GERD, and diastolic congestive heart failure who was brought to the emergency department with bright red blood per rectum.  She had episodes of hypotension and unresponsive in the in the ED and required intubation.  Patient was admitted to [facility] from 8/5-28 where she was treated for hemorrhagic shock from unknown source of bleeding, acute respiratory failure with hypoxia secondary to dysphasia secondary to an decreased cognition secondary to.  Patient is admitted to skilled nursing facility and a vastly decreased status from prior.  While at skilled nursing facility patient will be followed for hypertension treated with metoprolol 50 mg twice daily, polymyositis treated with CellCept 1000 mg twice daily, prednisone 20 mg every 12 hours and allopurinol 100 mg daily, and history of prior stroke treated with 81 mg aspirin daily.  Past Medical History:  Diagnosis Date  . Allergic rhinitis, cause unspecified   . Benign neoplasm of colon   . Chronic diastolic CHF (congestive heart failure) ([place])   . CVA (cerebral vascular accident) ([place]) 05/2017  . Esophageal reflux   . Excessive daytime sleepiness   .  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 GI hemorrhage 2011   recurrent  . Hyperlipidemia   . Osteoporosis   . Polymyositis ([place])   . Unspecified essential hypertension   . Vitamin D deficiency     Past Surgical History:  Procedure Laterality Date  . ABDOMINAL HYSTERECTOMY     partial  . BACK SURGERY    . COLONOSCOPY  01/09/2011   others also  . COLONOSCOPY  05/29/2011   Procedure: COLONOSCOPY;  Surgeon: Gatha Mayer, MD;  Location: [facility] ENDOSCOPY;  Service: Endoscopy;  Laterality: N/A;  Greggory Brandy Carlean Purl  . COLONOSCOPY WITH PROPOFOL N/A 02/16/2014   Procedure: COLONOSCOPY WITH PROPOFOL;  Surgeon: Milus Banister, MD;  Location: [facility] ENDOSCOPY;  Service: Endoscopy;  Laterality: N/A;  . ESOPHAGOGASTRODUODENOSCOPY  12/08/2010   others also  . ESOPHAGOGASTRODUODENOSCOPY N/A 08/17/2017   Procedure: ESOPHAGOGASTRODUODENOSCOPY (EGD);  Surgeon: Jackquline Denmark, MD;  Location: [facility] ENDOSCOPY;  Service: Endoscopy;  Laterality: N/A;  . ESOPHAGOGASTRODUODENOSCOPY (EGD) WITH PROPOFOL N/A 02/16/2014   Procedure: ESOPHAGOGASTRODUODENOSCOPY (EGD) WITH PROPOFOL;  Surgeon: Milus Banister, MD;  Location: [facility] ENDOSCOPY;  Service: Endoscopy;  Laterality: N/A;  . EUS    . HOT HEMOSTASIS  05/29/2011   Procedure: HOT HEMOSTASIS (ARGON PLASMA COAGULATION/BICAP);  Surgeon: Gatha Mayer, MD;  Location: [facility];  Service: Endoscopy;  Laterality: N/A;  . IR GASTROSTOMY TUBE MOD SED  09/02/2017  . LUMBAR LAMINECTOMY    . ROTATOR CUFF REPAIR Bilateral   . TONSILLECTOMY  age 84    Allergies as of 09/10/2017      Reactions   Codeine  Other (See Comments)   Feels drunk   Hydrocodone    "makes me sleepy"   Tramadol Hcl Other (See Comments)   Makes me feel drunk      Medication List        Accurate as of 09/10/17 10:33 AM. Always use your most recent med list.          acetaminophen 160 MG/5ML solution Commonly known as:  TYLENOL Place 20.3 mLs (650 mg total) into feeding tube every 6 (six) hours as needed  for mild pain, headache or fever.   allopurinol 100 MG tablet Commonly known as:  ZYLOPRIM Place 1 tablet (100 mg total) into feeding tube daily.   ALPRAZolam 0.25 MG tablet Commonly known as:  XANAX Place 1 tablet (0.25 mg total) into feeding tube 2 (two) times daily as needed for anxiety (for 14 days.).   aspirin 81 MG chewable tablet Place 1 tablet (81 mg total) into feeding tube daily.   diphenoxylate-atropine 2.5-0.025 MG/5ML liquid Commonly known as:  LOMOTIL Place 5 mLs into feeding tube daily as needed for diarrhea or loose stools.   feeding supplement (OSMOLITE 1.5 CAL) Liqd Place 237 mLs into feeding tube 4 (four) times daily.   feeding supplement (PRO-STAT SUGAR FREE 64) Liqd Place 30 mLs into feeding tube 2 (two) times daily.   free water Soln Place 300 mLs into feeding tube every 8 (eight) hours.   metoprolol tartrate 25 mg/10 mL Susp Commonly known as:  LOPRESSOR Place 20 mLs (50 mg total) into feeding tube 2 (two) times daily.   mycophenolate 50 mg/mL Susp oral suspension Commonly known as:  CELLCEPT Place 20 mLs (1,000 mg total) into feeding tube 2 (two) times daily.   pantoprazole sodium 40 mg/20 mL Pack Commonly known as:  PROTONIX Place 20 mLs (40 mg total) into feeding tube daily.   potassium chloride 20 MEQ/15ML (10%) Soln Place 7.5 mLs (10 mEq total) into feeding tube daily.   predniSONE 5 MG/ML concentrated solution Place 4 mLs (20 mg total) into feeding tube every 12 (twelve) hours.       No orders of the defined types were placed in this encounter.   Immunization History  Administered Date(s) Administered  . Influenza Split 10/07/2011  . Influenza Whole 10/13/2008    Social History   Tobacco Use  . Smoking status: Never Smoker  . Smokeless tobacco: Former Systems developer    Types: Snuff  . Tobacco comment: 07/21/2017 "no snuff  since ~ 04/2017"  Substance Use Topics  . Alcohol use: No    Family history is   Family History  Problem  Relation Age of Onset  . Dementia Mother   . Hypertension Father   . Malignant hyperthermia Father   . Colon cancer Neg Hx       Review of Systems unable to obtain secondary to patient's hypoxic brain injury; nursing- no acute concerns; per daughter- patient has thrush and supposed to be treating for it but there is no medicine for thrush on her med list    Vitals:   09/10/17 1023  BP: 128/63  Pulse: 70  Resp: 17  Temp: 98 F (36.7 C)    SpO2 Readings from Last 1 Encounters:  09/09/17 93%   Body mass index is 24.07 kg/m.     Physical Exam  GENERAL APPEARANCE: Alert, non-conversant; moaning constantly but does not appear in distress SKIN: No diaphoresis rash HEAD: Normocephalic, atraumatic  EYES: Conjunctiva/lids clear. Pupils round, reactive. EOMs intact.  EARS:  External exam WNL, canals clear. Hearing grossly normal.  NOSE: No deformity or discharge.  MOUTH/THROAT: Lips w/o lesions  RESPIRATORY: Breathing is even, unlabored. Lung sounds are clear   CARDIOVASCULAR: Heart RRR no murmurs, rubs or gallops. No peripheral edema.   GASTROINTESTINAL: Abdomen is soft, non-tender, not distended w/ normal bowel sounds: PEG tube. GENITOURINARY: Bladder non tender, not distended  MUSCULOSKELETAL: No abnormal joints or musculature NEUROLOGIC: Status post hypoxic brain injury, right side paresis PSYCHIATRIC: Not applicable  Patient Active Problem List   Diagnosis Date Noted  . Acute upper GI bleed 08/17/2017  . Nontraumatic hemorrhagic shock ([place]) 08/17/2017  . Hyperlipidemia 07/26/2017  . Pressure ulcer of buttock 07/22/2017  . Neck mass 07/21/2017  . Neuropathic pain   . 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 Neurologic deficit due to acute ischemic stroke ([place])   . Hypokalemia   . CVA (cerebral vascular accident) ([place]) 06/09/2017  . Multiple pulmonary nodules 06/09/2017  . Hypothermia 10/22/2016  . Chronic diastolic CHF (congestive heart failure) ([place])  10/22/2016  . Abdominal pain, unspecified site 05/17/2013  . Nausea alone 05/17/2013  . 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 Abdominal pain, left lower quadrant 05/28/2011  . Cecal ulcer with hemorrhage 01/10/2011  . Acute blood loss anemia 12/06/2010  . Anemia 12/05/2010  . Adrenal insufficiency ([place]) 12/05/2010  . Osteoporosis 12/05/2010  . LUNG INVOLVEMENT OTHER DISEASES CLASSIFIED ELSW 08/13/2009  . OTHER SPECIFIED DISORDER OF STOMACH AND DUODENUM 07/06/2008  . Essential hypertension 04/26/2007  . ALLERGIC RHINITIS 04/26/2007  . GASTROESOPHAGEAL REFLUX DISEASE 04/26/2007  . Polymyositis ([place]) 04/26/2007  . COLONIC POLYPS, ADENOMATOUS 02/06/2005      Labs reviewed: Basic Metabolic Panel:    Component Value Date/Time   NA 137 09/04/2017 0422   NA 141 08/15/2017   K 4.1 09/04/2017 0422   CL 105 09/04/2017 0422   CO2 25 09/04/2017 0422   GLUCOSE 138 (H) 09/04/2017 0422   BUN 13 09/04/2017 0422   BUN 31 (A) 08/15/2017   CREATININE 0.36 (L) 09/04/2017 0422   CALCIUM 8.4 (L) 09/04/2017 0422   PROT 4.5 (L) 09/02/2017 1157   ALBUMIN 2.3 (L) 09/02/2017 1157   AST 27 09/02/2017 1157   ALT 36 09/02/2017 1157   ALKPHOS 50 09/02/2017 1157   BILITOT 0.7 09/02/2017 1157   GFRNONAA >60 09/04/2017 0422   GFRAA >60 09/04/2017 0422    Recent Labs    08/21/17 0436  08/25/17 0414 08/26/17 0340 08/28/17 1134  09/02/17 1157 09/03/17 0323 09/04/17 0422  NA 143   < > 138 139 140   < > 142 138 137  K 3.3*   < > 3.0* 3.8 4.7   < > 3.0* 3.7 4.1  CL 107   < > 100 103 105   < > 107 104 105  CO2 26   < > 28 27 27    < > 26 27 25   GLUCOSE 79   < > 206* 111* 104*   < > 179* 86 138*  BUN 9   < > 5* 10 13   < > 19 12 13   CREATININE 0.34*   < > 0.47 0.43* 0.43*   < > 0.44 0.39* 0.36*  CALCIUM 8.5*   < > 8.2* 8.4* 8.7*   < > 8.0* 8.1* 8.4*  MG 1.7  --  1.7 2.3 2.1  --  2.1  --   --   PHOS 2.5  --  2.4* 2.3*  --   --   --   --   --    < > =  values in  this interval not displayed.   Liver Function Tests: Recent Labs    08/25/17 0414 08/26/17 0340 09/02/17 1157  AST 28 29 27   ALT 25 26 36  ALKPHOS 42 53 50  BILITOT 0.8 0.6 0.7  PROT 4.6* 5.2* 4.5*  ALBUMIN 2.5* 2.7* 2.3*   Recent Labs    05/15/17 1048  LIPASE 33   No results for input(s): AMMONIA in the last 8760 hours. CBC: Recent Labs    08/20/17 0428  08/22/17 0425 08/23/17 0415  09/02/17 0657 09/03/17 0323 09/04/17 0422  WBC 15.6*   < > 10.2 8.8   < > 9.7 11.4* 10.5  NEUTROABS 13.9*  --  8.0* 7.0  --   --   --   --   HGB 9.0*   < > 10.3* 10.1*   < > 10.6* 10.0* 11.2*  HCT 27.4*   < > 31.8* 31.2*   < > 34.9* 32.1* 35.3*  MCV 80.8   < > 80.7 81.3   < > 88.1 86.1 85.3  PLT 212   < > 221 248   < > 357 317 382   < > = values in this interval not displayed.   Lipid Recent Labs    06/10/17 0545  CHOL 299*  HDL 66  LDLCALC 164*  TRIG 347*    Cardiac Enzymes: Recent Labs    10/22/16 1600  01/02/17 1415  07/21/17 0907 07/22/17 0500 07/23/17 0401  CKTOTAL  --    < >  --    < > 2,750* 2,096* 1,552*  TROPONINI <0.03  --  <0.03  --   --   --   --    < > = values in this interval not displayed.   BNP: No results for input(s): BNP in the last 8760 hours. No results found for: [facility] Lab Results  Component Value Date   HGBA1C 4.7 (L) 06/10/2017   Lab Results  Component Value Date   TSH 1.080 10/22/2016   Lab Results  Component Value Date   VITAMINB12 1,377 (H) 08/25/2017   Lab Results  Component Value Date   FOLATE 25.4 08/25/2017   Lab Results  Component Value Date   IRON 55 05/30/2011   TIBC 266 05/30/2011   FERRITIN 68 05/30/2011    Imaging and Procedures obtained prior to SNF admission: Ct Head Wo Contrast  Result Date: 09/02/2017 CLINICAL DATA:  Encephalopathy EXAM: CT HEAD WITHOUT CONTRAST TECHNIQUE: Contiguous axial images were obtained from the base of the skull through the vertex without intravenous contrast. COMPARISON:   08/19/2017 FINDINGS: Brain: No findings to suggest acute hemorrhage, acute infarction or space-occupying mass lesion are noted. Marked bilateral chronic white matter ischemic change is noted stable from the prior exam. Vascular: No hyperdense vessel or unexpected calcification. Skull: Normal. Negative for fracture or focal lesion. Sinuses/Orbits: No acute finding. Other: None. IMPRESSION: Chronic white matter ischemic changes and atrophic changes stable from the prior exam. No acute abnormality is noted. Electronically Signed   By: Inez Catalina M.D.   On: 09/02/2017 19:45   Ir Gastrostomy Tube Mod Sed  Result Date: 09/02/2017 INDICATION: 77 year old female with dysphagia. She is currently taking enteral nutrition via a transpyloric feeding tube and presents for placement of a more durable percutaneous gastrostomy tube. EXAM: Fluoroscopically guided placement of percutaneous pull-through gastrostomy tube Interventional Radiologist:  Criselda Peaches, MD MEDICATIONS: 2 g Ancef; Antibiotics were administered within 1 hour of the procedure. ANESTHESIA/SEDATION: Versed 2 mg IV;  Fentanyl 50 mcg IV Moderate Sedation Time:  10 minutes The patient was continuously monitored during the procedure by the interventional radiology nurse under my direct supervision. CONTRAST:  15 mL Isovue-300 FLUOROSCOPY TIME:  Fluoroscopy Time: 1 minutes 54 seconds (7 mGy). COMPLICATIONS: None immediate. PROCEDURE: Informed written consent was obtained from the patient after a thorough discussion of the procedural risks, benefits and alternatives. All questions were addressed. Maximal Sterile Barrier Technique was utilized including caps, mask, sterile gowns, sterile gloves, sterile drape, hand hygiene and skin antiseptic. A timeout was performed prior to the initiation of the procedure. Maximal barrier sterile technique utilized including caps, mask, sterile gowns, sterile gloves, large sterile drape, hand hygiene, and chlorhexadine skin  prep. An angled catheter was advanced over a wire under fluoroscopic guidance through the nose, down the esophagus and into the body of the stomach. The stomach was then insufflated with several 100 ml of air. Fluoroscopy confirmed location of the gastric bubble, as well as inferior displacement of the barium stained colon. Under direct fluoroscopic guidance, a single T-tack was placed, and the anterior gastric wall drawn up against the anterior abdominal wall. Percutaneous access was then obtained into the mid gastric body with an 18 gauge sheath needle. Aspiration of air, and injection of contrast material under fluoroscopy confirmed needle placement. An Amplatz wire was advanced in the gastric body and the access needle exchanged for a 9-French vascular sheath. A snare device was advanced through the vascular sheath and an Amplatz wire advanced through the angled catheter. The Amplatz wire was successfully snared and this was pulled up through the esophagus and out the mouth. A 20-French Alinda Dooms MIC-PEG tube was then connected to the snare and pulled through the mouth, down the esophagus, into the stomach and out to the anterior abdominal wall. Hand injection of contrast material confirmed intragastric location. The T-tack retention suture was then cut. The pull through peg tube was then secured with the external bumper and capped. The patient will be observed for several hours with the newly placed tube on low wall suction to evaluate for any post procedure complication. The patient tolerated the procedure well, there is no immediate complication. IMPRESSION: Successful placement of a 20 French pull through gastrostomy tube. Electronically Signed   By: Jacqulynn Cadet M.D.   On: 09/02/2017 10:24     Not all labs, radiology exams or other studies done during hospitalization come through on my EPIC note; however they are reviewed by me.    Assessment and Plan  Hemorrhagic shock- admitted to ICU  with hypotension and lower GI bleed, now resolved; suspected to be secondary to diverticular bleed versus recurrent colonic AVM; GI was consulted, EGD on 8/15, negative for upper GI source of bleeding; due to overall poor prognosis and contractures no plan to pursue colonoscopic evaluation as there is no ongoing bleeding at this time; CT abdomen and pelvis were negative for etiology of acute bleed; ASA and Plavix on hold; stable no active bleed at this time SNF -admitted to skilled nursing facility for supportive care; follow-up CBC  Acute respiratory failure with hypoxia/hypoxic brain injury/PEG tube -unresponsive in the setting of hemorrhagic shock; intubated 8/5 and admitted to ICU, treated for aspiration pneumonia noted to have worsening dysphasia; extubated 8/19; completed antibiotic course; respiratory failure has resolved, patient is now on room air but of course continues to be high aspiration risk: PEG tube placed- 4 siblings, the youngest who knows patient best knows that she had no desire for  PEG tube, other 3 siblings who do not live here voted for a PEG tube SNF -continue tube feedings of the severely compromised patient  Dysphasia/hypoxic brain injury/PEG tube- secondary to hemorrhagic shock with hypoperfusion, aspiration pneumonia and prior CVA with atrophy and 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 to vascular dementia; prior to this admission was interactive and able to carry on some conversation, following extubation was noted to be nonverbal and had worsening dysphasia; seen by neurology underwent CTA head and neck which did not show any evidence of large vessel occlusion; failed numerous swallowing studies; seen by palliative care but 3 or 4 siblings wanted to pursue PEG tube placement despite multiple discussions on poor prognosis SNF -continue tube feedings NS patient with severe hypoxic brain injury  History of CVA SNF -no aspirin or Plavix due to probable lower GI bleed with hemorrhagic shock and  hypoxic brain injury  Hypertension SNF -no say patient should be on 75 mg twice daily of metoprolol and amlodipine 10 mg daily; the discharge medication list includes only metoprolol 50 mg twice daily but today patient's blood pressure is controlled on this  Oral thrush- treated hospital per daughter but not stated what they used SNF -Diflucan 100 mg 2 p.o. day 1 and 100 mg daily for 10 days  Polymyositis SNF -cont cellcept 1000 mf BID and prednisone 20 mg q 12 hrs  Diarrhea - 2/2 tube feeds SNF - cont imodium or lomotil prn   Time spent > 45 min;> 50% of time with patient was spent reviewing records, labs, tests and studies, counseling and developing plan of care  Webb Silversmith D. Sheppard Coil, MD

## 2017-09-13 ENCOUNTER — Encounter: Payer: Self-pay | Admitting: Internal Medicine

## 2017-09-13 DIAGNOSIS — J9601 Acute respiratory failure with hypoxia: Secondary | ICD-10-CM | POA: Insufficient documentation

## 2017-09-13 DIAGNOSIS — Z931 Gastrostomy status: Secondary | ICD-10-CM | POA: Insufficient documentation

## 2017-09-13 DIAGNOSIS — B370 Candidal stomatitis: Secondary | ICD-10-CM | POA: Insufficient documentation

## 2017-09-13 DIAGNOSIS — R197 Diarrhea, unspecified: Secondary | ICD-10-CM

## 2017-09-13 DIAGNOSIS — R1310 Dysphagia, unspecified: Secondary | ICD-10-CM | POA: Insufficient documentation

## 2017-09-13 DIAGNOSIS — G931 Anoxic brain damage, not elsewhere classified: Secondary | ICD-10-CM | POA: Insufficient documentation

## 2017-09-13 DIAGNOSIS — K909 Intestinal malabsorption, unspecified: Secondary | ICD-10-CM | POA: Insufficient documentation

## 2017-09-13 DIAGNOSIS — Z8719 Personal history of other diseases of the digestive system: Secondary | ICD-10-CM | POA: Insufficient documentation

## 2017-09-15 ENCOUNTER — Encounter: Payer: Medicare Other | Admitting: Internal Medicine

## 2017-09-15 DIAGNOSIS — Z515 Encounter for palliative care: Secondary | ICD-10-CM

## 2017-09-18 ENCOUNTER — Non-Acute Institutional Stay (SKILLED_NURSING_FACILITY): Payer: Medicare Other

## 2017-09-18 ENCOUNTER — Non-Acute Institutional Stay: Payer: Medicare Other | Admitting: Internal Medicine

## 2017-09-18 DIAGNOSIS — Z0000 Encounter for general adult medical examination without abnormal findings: Secondary | ICD-10-CM

## 2017-09-18 DIAGNOSIS — R531 Weakness: Secondary | ICD-10-CM

## 2017-09-18 DIAGNOSIS — Z515 Encounter for palliative care: Secondary | ICD-10-CM

## 2017-09-18 DIAGNOSIS — E46 Unspecified protein-calorie malnutrition: Secondary | ICD-10-CM

## 2017-09-18 NOTE — Patient Instructions (Signed)
[name] [name] , Thank you for taking time to come for your Medicare Wellness Visit. I appreciate your ongoing commitment to your health goals. Please review the following plan we discussed and let me know if I can assist you in the future.   Screening recommendations/referrals: Colonoscopy excluded, over age 78 Mammogram excluded, over age 65 Bone Density up to date Recommended yearly ophthalmology/optometry visit for glaucoma screening and checkup Recommended yearly dental visit for hygiene and checkup  Vaccinations: Influenza vaccine due, will receive at [facility] Pneumococcal vaccine 13 due, ordered Tdap vaccine due, ordered Shingles vaccine not in past records    Advanced directives: in chart  Conditions/risks identified: one  Next appointment: Dr. Sheppard Coil makes rounds   Preventive Care 65 Years and Older, Female Preventive care refers to lifestyle choices and visits with your health care provider that can promote health and wellness. What does preventive care include?  A yearly physical exam. This is also called an annual well check.  Dental exams once or twice a year.  Routine eye exams. Ask your health care provider how often you should have your eyes checked.  Personal lifestyle choices, including:  Daily care of your teeth and gums.  Regular physical activity.  Eating a healthy diet.  Avoiding tobacco and drug use.  Limiting alcohol use.  Practicing safe sex.  Taking low-dose aspirin every day.  Taking vitamin and mineral supplements as recommended by your health care provider. What happens during an annual well check? The services and screenings done by your health care provider during your annual well check will depend on your age, overall health, lifestyle risk factors, and family history of disease. Counseling  Your health care provider may ask you questions about your:  Alcohol use.  Tobacco use.  Drug use.  Emotional well-being.  Home  and relationship well-being.  Sexual activity.  Eating habits.  History of falls.  Memory and ability to understand (cognition).  Work and work Statistician.  Reproductive health. Screening  You may have the following tests or measurements:  Height, weight, and BMI.  Blood pressure.  Lipid and cholesterol levels. These may be checked every 5 years, or more frequently if you are over 43 years old.  Skin check.  Lung cancer screening. You may have this screening every year starting at age 42 if you have a 30-pack-year history of smoking and currently smoke or have quit within the past 15 years.  Fecal occult blood test (FOBT) of the stool. You may have this test every year starting at age 47.  Flexible sigmoidoscopy or colonoscopy. You may have a sigmoidoscopy every 5 years or a colonoscopy every 10 years starting at age 35.  Hepatitis C blood test.  Hepatitis B blood test.  Sexually transmitted disease (STD) testing.  Diabetes screening. This is done by checking your blood sugar (glucose) after you have not eaten for a while (fasting). You may have this done every 1-3 years.  Bone density scan. This is done to screen for osteoporosis. You may have this done starting at age 69.  Mammogram. This may be done every 1-2 years. Talk to your health care provider about how often you should have regular mammograms. Talk with your health care provider about your test results, treatment options, and if necessary, the need for more tests. Vaccines  Your health care provider may recommend certain vaccines, such as:  Influenza vaccine. This is recommended every year.  Tetanus, diphtheria, and acellular pertussis (Tdap, Td) vaccine. You may need  a Td booster every 10 years.  Zoster vaccine. You may need this after age 41.  Pneumococcal 13-valent conjugate (PCV13) vaccine. One dose is recommended after age 56.  Pneumococcal polysaccharide (PPSV23) vaccine. One dose is recommended  after age 68. Talk to your health care provider about which screenings and vaccines you need and how often you need them. This information is not intended to replace advice given to you by your health care provider. Make sure you discuss any questions you have with your health care provider. Document Released: 01/26/2015 Document Revised: 09/19/2015 Document Reviewed: 10/31/2014 Elsevier Interactive Patient Education  2017 [place] Prevention in the Home Falls can cause injuries. They can happen to people of all ages. There are many things you can do to make your home safe and to help prevent falls. What can I do on the outside of my home?  Regularly fix the edges of walkways and driveways and fix any cracks.  Remove anything that might make you trip as you walk through a door, such as a raised step or threshold.  Trim any bushes or trees on the path to your home.  Use bright outdoor lighting.  Clear any walking paths of anything that might make someone trip, such as rocks or tools.  Regularly check to see if handrails are loose or broken. Make sure that both sides of any steps have handrails.  Any raised decks and porches should have guardrails on the edges.  Have any leaves, snow, or ice cleared regularly.  Use sand or salt on walking paths during winter.  Clean up any spills in your garage right away. This includes oil or grease spills. What can I do in the bathroom?  Use night lights.  Install grab bars by the toilet and in the tub and shower. Do not use towel bars as grab bars.  Use non-skid mats or decals in the tub or shower.  If you need to sit down in the shower, use a plastic, non-slip stool.  Keep the floor dry. Clean up any water that spills on the floor as soon as it happens.  Remove soap buildup in the tub or shower regularly.  Attach bath mats securely with double-sided non-slip rug tape.  Do not have throw rugs and other things on the floor  that can make you trip. What can I do in the bedroom?  Use night lights.  Make sure that you have a light by your bed that is easy to reach.  Do not use any sheets or blankets that are too big for your bed. They should not hang down onto the floor.  Have a firm chair that has side arms. You can use this for support while you get dressed.  Do not have throw rugs and other things on the floor that can make you trip. What can I do in the kitchen?  Clean up any spills right away.  Avoid walking on wet floors.  Keep items that you use a lot in easy-to-reach places.  If you need to reach something above you, use a strong step stool that has a grab bar.  Keep electrical cords out of the way.  Do not use floor polish or wax that makes floors slippery. If you must use wax, use non-skid floor wax.  Do not have throw rugs and other things on the floor that can make you trip. What can I do with my stairs?  Do not leave any items on the  stairs.  Make sure that there are handrails on both sides of the stairs and use them. Fix handrails that are broken or loose. Make sure that handrails are as long as the stairways.  Check any carpeting to make sure that it is firmly attached to the stairs. Fix any carpet that is loose or worn.  Avoid having throw rugs at the top or bottom of the stairs. If you do have throw rugs, attach them to the floor with carpet tape.  Make sure that you have a light switch at the top of the stairs and the bottom of the stairs. If you do not have them, ask someone to add them for you. What else can I do to help prevent falls?  Wear shoes that:  Do not have high heels.  Have rubber bottoms.  Are comfortable and fit you well.  Are closed at the toe. Do not wear sandals.  If you use a stepladder:  Make sure that it is fully opened. Do not climb a closed stepladder.  Make sure that both sides of the stepladder are locked into place.  Ask someone to hold it  for you, if possible.  Clearly mark and make sure that you can see:  Any grab bars or handrails.  First and last steps.  Where the edge of each step is.  Use tools that help you move around (mobility aids) if they are needed. These include:  Canes.  Walkers.  Scooters.  Crutches.  Turn on the lights when you go into a dark area. Replace any light bulbs as soon as they burn out.  Set up your furniture so you have a clear path. Avoid moving your furniture around.  If any of your floors are uneven, fix them.  If there are any pets around you, be aware of where they are.  Review your medicines with your doctor. Some medicines can make you feel dizzy. This can increase your chance of falling. Ask your doctor what other things that you can do to help prevent falls. This information is not intended to replace advice given to you by your health care provider. Make sure you discuss any questions you have with your health care provider. Document Released: 10/26/2008 Document Revised: 06/07/2015 Document Reviewed: 02/03/2014 Elsevier Interactive Patient Education  2017 [employer].

## 2017-09-18 NOTE — Progress Notes (Signed)
Subjective:   [name] [name] is a 77 y.o. female who presents for Medicare Annual (Subsequent) preventive examination at [place]; incapacitated patient unable to answer questions appropriately   Last AWV-10/07/2016       Objective:     Vitals: BP 125/64 (BP Location: Left Arm, Patient Position: Supine)   Pulse 75   Temp 98 F (36.7 C) (Oral)   Ht 5\' 2"  (1.575 m)   Wt 131 lb (59.4 kg)   BMI 23.96 kg/m   Body mass index is 23.96 kg/m.  Advanced Directives 09/18/2017 08/28/2017 07/21/2017 06/12/2017 06/10/2017 06/09/2017 01/02/2017  Does Patient Have a Medical Advance Directive? No No No No No No No  Would patient like information on creating a medical advance directive? No - Patient declined No - Patient declined No - Patient declined No - Patient declined No - Patient declined - -  Pre-existing out of facility DNR order (yellow form or pink MOST form) - - - - - - -    Tobacco Social History   Tobacco Use  Smoking Status Never Smoker  Smokeless Tobacco Former Systems developer  . Types: Snuff  Tobacco Comment   07/21/2017 "no snuff  since ~ 04/2017"     Counseling given: Not Answered Comment: 07/21/2017 "no snuff  since ~ 04/2017"   Clinical Intake:  Pre-visit preparation completed: No  Pain : Faces Pain Score: 0-No pain     Nutritional Risks: None Diabetes: No  How often do you need to have someone help you when you read instructions, pamphlets, or other written materials from your doctor or pharmacy?: 5 - Always  Interpreter Needed?: No  Information entered by :: Tyson Dense, RN  Past Medical History:  Diagnosis Date  . Allergic rhinitis, cause unspecified   . Benign neoplasm of colon   . Chronic diastolic CHF (congestive heart failure) ([place])   . CVA (cerebral vascular accident) ([place]) 05/2017  . Esophageal reflux   . Excessive daytime sleepiness   . 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 GI hemorrhage 2011   recurrent  . Hyperlipidemia     . Osteoporosis   . Polymyositis ([place])   . Unspecified essential hypertension   . Vitamin D deficiency    Past Surgical History:  Procedure Laterality Date  . ABDOMINAL HYSTERECTOMY     partial  . BACK SURGERY    . COLONOSCOPY  01/09/2011   others also  . COLONOSCOPY  05/29/2011   Procedure: COLONOSCOPY;  Surgeon: Gatha Mayer, MD;  Location: [facility] ENDOSCOPY;  Service: Endoscopy;  Laterality: N/A;  Greggory Brandy Carlean Purl  . COLONOSCOPY WITH PROPOFOL N/A 02/16/2014   Procedure: COLONOSCOPY WITH PROPOFOL;  Surgeon: Milus Banister, MD;  Location: [facility] ENDOSCOPY;  Service: Endoscopy;  Laterality: N/A;  . ESOPHAGOGASTRODUODENOSCOPY  12/08/2010   others also  . ESOPHAGOGASTRODUODENOSCOPY N/A 08/17/2017   Procedure: ESOPHAGOGASTRODUODENOSCOPY (EGD);  Surgeon: Jackquline Denmark, MD;  [facility] ENDOSCOPY;  Service: Endoscopy;  Laterality: N/A;  . ESOPHAGOGASTRODUODENOSCOPY (EGD) WITH PROPOFOL N/A 02/16/2014   Procedure: ESOPHAGOGASTRODUODENOSCOPY (EGD) WITH PROPOFOL;  Surgeon: Milus Banister, MD;  Location: [facility] ENDOSCOPY;  Service: Endoscopy;  Laterality: N/A;  . EUS    . HOT HEMOSTASIS  05/29/2011   Procedure: HOT HEMOSTASIS (ARGON PLASMA COAGULATION/BICAP);  Surgeon: Gatha Mayer, MD;  Location: [facility] ENDOSCOPY;  Service: Endoscopy;  Laterality: N/A;  . IR GASTROSTOMY TUBE MOD SED  09/02/2017  . LUMBAR LAMINECTOMY    . ROTATOR CUFF REPAIR Bilateral   .  TONSILLECTOMY  age 41   Family History  Problem Relation Age of Onset  . Dementia Mother   . Hypertension Father   . Malignant hyperthermia Father   . Colon cancer Neg Hx    Social History   Socioeconomic History  . Marital status: Divorced    Spouse name: Not on file  . Number of children: 5  . Years of education: 11th grade  . Highest education level: Not on file  Occupational History  . Occupation: retired   Scientific laboratory technician  . Financial resource strain: Not on file  . Food insecurity:    Worry: Not on file    Inability: Not on file  .  Transportation needs:    Medical: Not on file    Non-medical: Not on file  Tobacco Use  . Smoking status: Never Smoker  . Smokeless tobacco: Former Systems developer    Types: Snuff  . Tobacco comment: 07/21/2017 "no snuff  since ~ 04/2017"  Substance and Sexual Activity  . Alcohol use: No  . Drug use: No  . Sexual activity: Not Currently  Lifestyle  . Physical activity:    Days per week: Not on file    Minutes per session: Not on file  . Stress: Not on file  Relationships  . Social connections:    Talks on phone: Not on file    Gets together: Not on file    Attends religious service: Not on file    Active member of club or organization: Not on file    Attends meetings of clubs or organizations: Not on file    Relationship status: Not on file  Other Topics Concern  . Not on file  Social History Narrative   Lives at home with her son.   Right-handed.   1 cup caffeine per day.    Outpatient Encounter Medications as of 09/18/2017  Medication Sig  . acetaminophen (TYLENOL) 160 MG/5ML solution Place 20.3 mLs (650 mg total) into feeding tube every 6 (six) hours as needed for mild pain, headache or fever.  Marland Kitchen allopurinol (ZYLOPRIM) 100 MG tablet Place 1 tablet (100 mg total) into feeding tube daily.  Marland Kitchen ALPRAZolam (XANAX) 0.25 MG tablet Place 1 tablet (0.25 mg total) into feeding tube 2 (two) times daily as needed for anxiety (for 14 days.).  Marland Kitchen Amino Acids-Protein Hydrolys (FEEDING SUPPLEMENT, PRO-STAT SUGAR FREE 64,) LIQD Place 30 mLs into feeding tube 2 (two) times daily.  Marland Kitchen aspirin 81 MG chewable tablet Place 1 tablet (81 mg total) into feeding tube daily.  . diphenoxylate-atropine (LOMOTIL) 2.5-0.025 MG/5ML liquid Place 5 mLs into feeding tube daily as needed for diarrhea or loose stools.  . metoprolol tartrate (LOPRESSOR) 25 mg/10 mL SUSP Place 20 mLs (50 mg total) into feeding tube 2 (two) times daily.  . mycophenolate (CELLCEPT) 50 mg/mL SUSP oral suspension Place 20 mLs (1,000 mg total) into  feeding tube 2 (two) times daily.  . Nutritional Supplements (FEEDING SUPPLEMENT, OSMOLITE 1.5 CAL,) LIQD Place 237 mLs into feeding tube 4 (four) times daily.  . pantoprazole sodium (PROTONIX) 40 mg/20 mL PACK Place 20 mLs (40 mg total) into feeding tube daily.  . potassium chloride 20 MEQ/15ML (10%) SOLN Place 7.5 mLs (10 mEq total) into feeding tube daily.  . predniSONE 5 MG/ML concentrated solution Place 4 mLs (20 mg total) into feeding tube every 12 (twelve) hours.  . Water For Irrigation, Sterile (FREE WATER) SOLN Place 300 mLs into feeding tube every 8 (eight) hours.   No  facility-administered encounter medications on file as of 09/18/2017.     Activities of Daily Living In your present state of health, do you have any difficulty performing the following activities: 09/18/2017 08/28/2017  Hearing? N N  Vision? Y Y  Difficulty concentrating or making decisions? Tempie Donning  Walking or climbing stairs? Y Y  Dressing or bathing? Y Y  Doing errands, shopping? [place] and eating ? Y -  Using the Toilet? Y -  In the past six months, have you accidently leaked urine? Y -  Do you have problems with loss of bowel control? Y -  Managing your Medications? Y -  Managing your Finances? Y -  Housekeeping or managing your Housekeeping? Y -  Some recent data might be hidden    Patient Care Team: [name], MD as PCP - General (Internal Medicine)    Assessment:   This is a routine wellness examination for [place].  Exercise Activities and Dietary recommendations Current Exercise Habits: The patient does not participate in regular exercise at present, Exercise limited by: orthopedic condition(s)  Goals   None     Fall Risk Fall Risk  09/18/2017  Falls in the past year? No   Is the patient's home free of loose throw rugs in walkways, pet beds, electrical cords, etc?   yes      Grab bars in the bathroom? yes      Handrails on the stairs?   yes      Adequate lighting?    yes  Depression Screen PHQ 2/9 Scores 09/18/2017  Exception Documentation Medical reason     Cognitive Function        Immunization History  Administered Date(s) Administered  . Influenza Split 10/07/2011  . Influenza Whole 10/13/2008    Qualifies for Shingles Vaccine? Not\ in past records  Screening Tests Health Maintenance  Topic Date Due  . TETANUS/TDAP  10/05/1958  . DEXA SCAN  10/04/2004  . PNA vac Low Risk Adult (1 of 2 - PCV13) 10/04/2004  . INFLUENZA VACCINE  08/13/2017    Cancer Screenings: Lung: Low Dose CT Chest recommended if Age 86-80 years, 30 pack-year currently smoking OR have quit w/in 15years. Patient does not qualify. Breast:  Up to date on Mammogram? Yes   Up to date of Bone Density/Dexa? No, excluded Colorectal: up to date  Additional Screenings:  Hepatitis C Screening: unable to appropriately accept or decline Flu vaccine due: will receive at [facility] due: ordered Prevnar due: ordered    Plan:  I have personally reviewed and addressed the Medicare Annual Wellness questionnaire and have noted the following in the patient's chart:  A. Medical and social history B. Use of alcohol, tobacco or illicit drugs  C. Current medications and supplements D. Functional ability and status E.  Nutritional status F.  Physical activity G. Advance directives H. List of other physicians I.  Hospitalizations, surgeries, and ER visits in previous 12 months J.  [place] to include hearing, vision, cognitive, depression L. Referrals and appointments - none  In addition, I am unable to review and discuss with incapacitated patient certain preventive protocols, quality metrics, and best practice recommendations. A written personalized care plan for preventive services as well as general preventive health recommendations were provided to patient.   See attached scanned questionnaire for additional information.   Signed,   Tyson Dense,  RN Nurse Health Advisor

## 2017-09-22 ENCOUNTER — Non-Acute Institutional Stay (SKILLED_NURSING_FACILITY): Payer: Medicare Other | Admitting: Internal Medicine

## 2017-09-22 DIAGNOSIS — R52 Pain, unspecified: Secondary | ICD-10-CM | POA: Diagnosis not present

## 2017-09-22 DIAGNOSIS — B370 Candidal stomatitis: Secondary | ICD-10-CM | POA: Diagnosis not present

## 2017-09-25 ENCOUNTER — Non-Acute Institutional Stay: Payer: Medicare Other | Admitting: Internal Medicine

## 2017-09-25 ENCOUNTER — Non-Acute Institutional Stay (SKILLED_NURSING_FACILITY): Payer: Medicare Other | Admitting: Internal Medicine

## 2017-09-25 DIAGNOSIS — L89154 Pressure ulcer of sacral region, stage 4: Secondary | ICD-10-CM

## 2017-09-25 DIAGNOSIS — R531 Weakness: Secondary | ICD-10-CM

## 2017-09-25 DIAGNOSIS — G931 Anoxic brain damage, not elsewhere classified: Secondary | ICD-10-CM

## 2017-09-25 DIAGNOSIS — R578 Other shock: Secondary | ICD-10-CM | POA: Diagnosis not present

## 2017-09-25 DIAGNOSIS — Z515 Encounter for palliative care: Secondary | ICD-10-CM

## 2017-09-27 ENCOUNTER — Encounter: Payer: Self-pay | Admitting: Internal Medicine

## 2017-09-27 NOTE — Progress Notes (Signed)
Location:  [facility]   Place of Service:  SNF (31)Skilled nursing facility Provider:Sharanya Templin D Takota Cahalan MD   [name], MD  Patient Care Team: [name], MD as PCP - General (Internal Medicine)  Extended Emergency Contact Information Primary Emergency Contact: Briceno,Sandrekia Address: [address]          [name], [place] [address] Johnnette Litter of [place] Phone: [phone] Relation: Daughter Secondary Emergency Contact: Adrean, [place] Mobile Phone: [phone] Relation: Son    Allergies: Codeine; Hydrocodone; and Tramadol hcl  Chief Complaint  Patient presents with  . Acute Visit    HPI: Patient is 77 y.o. female who is being seen for pain control; patient is locked in, and that she can understand and was being said to her but cannot verbalize herself.  She moans and moans.  In addition she still has oral thrush which is are being treated with Diflucan apparently without success.  Past Medical History:  Diagnosis Date  . Allergic rhinitis, cause unspecified   . Benign neoplasm of colon   . Chronic diastolic CHF (congestive heart failure) ([place])   . CVA (cerebral vascular accident) ([place]) 05/2017  . Esophageal reflux   . Excessive daytime sleepiness   . 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 GI hemorrhage 2011   recurrent  . Hyperlipidemia   . Osteoporosis   . Polymyositis ([place])   . Unspecified essential hypertension   . Vitamin D deficiency     Past Surgical History:  Procedure Laterality Date  . ABDOMINAL HYSTERECTOMY     partial  . BACK SURGERY    . COLONOSCOPY  01/09/2011   others also  . COLONOSCOPY  05/29/2011   Procedure: COLONOSCOPY;  Surgeon: Gatha Mayer, MD;  Location: [facility] ENDOSCOPY;  Service: Endoscopy;  Laterality: N/A;  Greggory Brandy Carlean Purl  . COLONOSCOPY WITH PROPOFOL N/A 02/16/2014   Procedure: COLONOSCOPY WITH PROPOFOL;  Surgeon: Milus Banister, MD;  Location: [facility] ENDOSCOPY;  Service: Endoscopy;   Laterality: N/A;  . ESOPHAGOGASTRODUODENOSCOPY  12/08/2010   others also  . ESOPHAGOGASTRODUODENOSCOPY N/A 08/17/2017   Procedure: ESOPHAGOGASTRODUODENOSCOPY (EGD);  Surgeon: Jackquline Denmark, MD;  Location[facility]COPY;  Service: Endoscopy;  Laterality: N/A;  . ESOPHAGOGASTRODUODENOSCOPY (EGD) WITH PROPOFOL N/A 02/16/2014   Procedure: ESOPHAGOGASTRODUODENOSCOPY (EGD) WITH PROPOFOL;  Surgeon: Milus Banister, MD;  Location: [facility] ENDOSCOPY;  Service: Endoscopy;  Laterality: N/A;  . EUS    . HOT HEMOSTASIS  05/29/2011   Procedure: HOT HEMOSTASIS (ARGON PLASMA COAGULATION/BICAP);  Surgeon: Gatha Mayer, MD;  Location: [facility];  Service: Endoscopy;  Laterality: N/A;  . IR GASTROSTOMY TUBE MOD SED  09/02/2017  . LUMBAR LAMINECTOMY    . ROTATOR CUFF REPAIR Bilateral   . TONSILLECTOMY  age 19    Allergies as of 09/22/2017      Reactions   Codeine Other (See Comments)   Feels drunk   Hydrocodone    "makes me sleepy"   Tramadol Hcl Other (See Comments)   Makes me feel drunk      Medication List        Accurate as of 09/22/17 11:59 PM. Always use your most recent med list.          acetaminophen 160 MG/5ML solution Commonly known as:  TYLENOL Place 20.3 mLs (650 mg total) into feeding tube every 6 (six) hours as needed for mild pain, headache or fever.   allopurinol 100 MG tablet Commonly known as:  Muddy 1  tablet (100 mg total) into feeding tube daily.   ALPRAZolam 0.25 MG tablet Commonly known as:  XANAX Place 1 tablet (0.25 mg total) into feeding tube 2 (two) times daily as needed for anxiety (for 14 days.).   aspirin 81 MG chewable tablet Place 1 tablet (81 mg total) into feeding tube daily.   diphenoxylate-atropine 2.5-0.025 MG/5ML liquid Commonly known as:  LOMOTIL Place 5 mLs into feeding tube daily as needed for diarrhea or loose stools.   feeding supplement (OSMOLITE 1.5 CAL) Liqd Place 237 mLs into feeding tube 4 (four) times daily.   feeding supplement  (PRO-STAT SUGAR FREE 64) Liqd Place 30 mLs into feeding tube 2 (two) times daily.   free water Soln Place 300 mLs into feeding tube every 8 (eight) hours.   metoprolol tartrate 25 mg/10 mL Susp Commonly known as:  LOPRESSOR Place 20 mLs (50 mg total) into feeding tube 2 (two) times daily.   mycophenolate 50 mg/mL Susp oral suspension Commonly known as:  CELLCEPT Place 20 mLs (1,000 mg total) into feeding tube 2 (two) times daily.   pantoprazole sodium 40 mg/20 mL Pack Commonly known as:  PROTONIX Place 20 mLs (40 mg total) into feeding tube daily.   potassium chloride 20 MEQ/15ML (10%) Soln Place 7.5 mLs (10 mEq total) into feeding tube daily.   predniSONE 5 MG/ML concentrated solution Place 4 mLs (20 mg total) into feeding tube every 12 (twelve) hours.       No orders of the defined types were placed in this encounter.   Immunization History  Administered Date(s) Administered  . Influenza Split 10/07/2011  . Influenza Whole 10/13/2008    Social History   Tobacco Use  . Smoking status: Never Smoker  . Smokeless tobacco: Former Systems developer    Types: Snuff  . Tobacco comment: 07/21/2017 "no snuff  since ~ 04/2017"  Substance Use Topics  . Alcohol use: No    Review of Systems unable to obtain from patient secondary to locked-in status; per nursing patient still with rash and patient with decubitus on her sacrum   Vitals:   09/27/17 1340  BP: 112/64  Pulse: 75  Resp: 18  Temp: (!) 97.4 F (36.3 C)   There is no height or weight on file to calculate BMI. Physical Exam  GENERAL APPEARANCE: Alert, nonconversant; is moaning SKIN: Dressed; reported decubitus ulcer HEENT: Unremarkable RESPIRATORY: Breathing is even, unlabored. Lung sounds are clear   CARDIOVASCULAR: Heart RRR no murmurs, rubs or gallops. No peripheral edema  GASTROINTESTINAL: Abdomen is soft, non-tender, not distended w/ normal bowel sounds.  GENITOURINARY: Bladder non tender, not distended    MUSCULOSKELETAL: No abnormal joints or musculature NEUROLOGIC: That is post hypoxic brain injury, right side paresis; it appears that patient can understand but she cannot speak; I understand from her that she does not want to live like this PSYCHIATRIC: Not applicable  Patient Active Problem List   Diagnosis Date Noted  . History of lower GI bleeding 09/13/2017  . Acute respiratory failure with hypoxia ([place]) 09/13/2017  . Hypoxic brain injury ([place]) 09/13/2017  . Dysphagia 09/13/2017  . Status post insertion of percutaneous endoscopic gastrostomy (PEG) tube ([place]) 09/13/2017  . Thrush, oral 09/13/2017  . Diarrhea due to malabsorption 09/13/2017  . Acute upper GI bleed 08/17/2017  . Nontraumatic hemorrhagic shock ([place]) 08/17/2017  . Hyperlipidemia 07/26/2017  . Pressure ulcer of buttock 07/22/2017  . Neck mass 07/21/2017  . Neuropathic pain   . 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 Neurologic deficit due to acute ischemic stroke ([place])   . Hypokalemia   . CVA (cerebral vascular accident) ([place]) 06/09/2017  . Multiple pulmonary nodules 06/09/2017  . Hypothermia 10/22/2016  . Chronic diastolic CHF (congestive heart failure) ([place]) 10/22/2016  . Abdominal pain, unspecified site 05/17/2013  . Nausea alone 05/17/2013  . 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 Abdominal pain, left lower quadrant 05/28/2011  . Cecal ulcer with hemorrhage 01/10/2011  . Acute blood loss anemia 12/06/2010  . Anemia 12/05/2010  . Adrenal insufficiency ([place]) 12/05/2010  . Osteoporosis 12/05/2010  . LUNG INVOLVEMENT OTHER DISEASES CLASSIFIED ELSW 08/13/2009  . OTHER SPECIFIED DISORDER OF STOMACH AND DUODENUM 07/06/2008  . Essential hypertension 04/26/2007  . ALLERGIC RHINITIS 04/26/2007  . GASTROESOPHAGEAL REFLUX DISEASE 04/26/2007  . Polymyositis ([place]) 04/26/2007  . COLONIC POLYPS, ADENOMATOUS 02/06/2005    CMP     Component Value Date/Time   NA 137  09/04/2017 0422   NA 141 08/15/2017   K 4.1 09/04/2017 0422   CL 105 09/04/2017 0422   CO2 25 09/04/2017 0422   GLUCOSE 138 (H) 09/04/2017 0422   BUN 13 09/04/2017 0422   BUN 31 (A) 08/15/2017   CREATININE 0.36 (L) 09/04/2017 0422   CALCIUM 8.4 (L) 09/04/2017 0422   PROT 4.5 (L) 09/02/2017 1157   ALBUMIN 2.3 (L) 09/02/2017 1157   AST 27 09/02/2017 1157   ALT 36 09/02/2017 1157   ALKPHOS 50 09/02/2017 1157   BILITOT 0.7 09/02/2017 1157   GFRNONAA >60 09/04/2017 0422   GFRAA >60 09/04/2017 0422   Recent Labs    08/21/17 0436  08/25/17 0414 08/26/17 0340 08/28/17 1134  09/02/17 1157 09/03/17 0323 09/04/17 0422  NA 143   < > 138 139 140   < > 142 138 137  K 3.3*   < > 3.0* 3.8 4.7   < > 3.0* 3.7 4.1  CL 107   < > 100 103 105   < > 107 104 105  CO2 26   < > 28 27 27    < > 26 27 25   GLUCOSE 79   < > 206* 111* 104*   < > 179* 86 138*  BUN 9   < > 5* 10 13   < > 19 12 13   CREATININE 0.34*   < > 0.47 0.43* 0.43*   < > 0.44 0.39* 0.36*  CALCIUM 8.5*   < > 8.2* 8.4* 8.7*   < > 8.0* 8.1* 8.4*  MG 1.7  --  1.7 2.3 2.1  --  2.1  --   --   PHOS 2.5  --  2.4* 2.3*  --   --   --   --   --    < > = values in this interval not displayed.   Recent Labs    08/25/17 0414 08/26/17 0340 09/02/17 1157  AST 28 29 27   ALT 25 26 36  ALKPHOS 42 53 50  BILITOT 0.8 0.6 0.7  PROT 4.6* 5.2* 4.5*  ALBUMIN 2.5* 2.7* 2.3*   Recent Labs    08/20/17 0428  08/22/17 0425 08/23/17 0415  09/02/17 0657 09/03/17 0323 09/04/17 0422  WBC 15.6*   < > 10.2 8.8   < > 9.7 11.4* 10.5  NEUTROABS 13.9*  --  8.0* 7.0  --   --   --   --   HGB 9.0*   < > 10.3* 10.1*   < > 10.6* 10.0* 11.2*  HCT 27.4*   < > 31.8* 31.2*   < > 34.9* 32.1* 35.3*  MCV 80.8   < > 80.7 81.3   < > 88.1 86.1 85.3  PLT 212   < > 221 248   < > 357 317 382   < > = values in this interval not displayed.   Recent Labs    06/10/17 0545  CHOL 299*  LDLCALC 164*  TRIG 347*   No results found for: [facility] Lab Results    Component Value Date   TSH 1.080 10/22/2016   Lab Results  Component Value Date   HGBA1C 4.7 (L) 06/10/2017   Lab Results  Component Value Date   CHOL 299 (H) 06/10/2017   HDL 66 06/10/2017   LDLCALC 164 (H) 06/10/2017   TRIG 347 (H) 06/10/2017   CHOLHDL 4.5 06/10/2017    Significant Diagnostic Results in last 30 days:  Dg Abd 1 View  Result Date: 09/01/2017 CLINICAL DATA:  Evaluate feeding tube EXAM: ABDOMEN - 1 VIEW COMPARISON:  08/23/2017 FINDINGS: Single fluoroscopic image of the abdomen. According to study nodes, feeding tube with unclogged in radiology by guidewire. Total fluoroscopy time was 24 seconds. Subsegmental atelectasis at the right base. Probable tiny left effusion. Tip of the feeding tube projects over the distal stomach. IMPRESSION: Tip of feeding tube projects over the distal stomach Electronically Signed   By: Donavan Foil M.D.   On: 09/01/2017 19:59   Ct Head Wo Contrast  Result Date: 09/02/2017 CLINICAL DATA:  Encephalopathy EXAM: CT HEAD WITHOUT CONTRAST TECHNIQUE: Contiguous axial images were obtained from the base of the skull through the vertex without intravenous contrast. COMPARISON:  08/19/2017 FINDINGS: Brain: No findings to suggest acute hemorrhage, acute infarction or space-occupying mass lesion are noted. Marked bilateral chronic white matter ischemic change is noted stable from the prior exam. Vascular: No hyperdense vessel or unexpected calcification. Skull: Normal. Negative for fracture or focal lesion. Sinuses/Orbits: No acute finding. Other: None. IMPRESSION: Chronic white matter ischemic changes and atrophic changes stable from the prior exam. No acute abnormality is noted. Electronically Signed   By: Inez Catalina M.D.   On: 09/02/2017 19:45   Ir Gastrostomy Tube Mod Sed  Result Date: 09/02/2017 INDICATION: 77 year old female with dysphagia. She is currently taking enteral nutrition via a transpyloric feeding tube and presents for placement of a  more durable percutaneous gastrostomy tube. EXAM: Fluoroscopically guided placement of percutaneous pull-through gastrostomy tube Interventional Radiologist:  Criselda Peaches, MD MEDICATIONS: 2 g Ancef; Antibiotics were administered within 1 hour of the procedure. ANESTHESIA/SEDATION: Versed 2 mg IV; Fentanyl 50 mcg IV Moderate Sedation Time:  10 minutes The patient was continuously monitored during the procedure by the interventional radiology nurse under my direct supervision. CONTRAST:  15 mL Isovue-300 FLUOROSCOPY TIME:  Fluoroscopy Time: 1 minutes 54 seconds (7 mGy). COMPLICATIONS: None immediate. PROCEDURE: Informed written consent was obtained from the patient after a thorough discussion of the procedural risks, benefits and alternatives. All questions were addressed. Maximal Sterile Barrier Technique was utilized including caps, mask, sterile gowns, sterile gloves, sterile drape, hand hygiene and skin antiseptic. A timeout was performed prior to the initiation of the procedure. Maximal barrier sterile technique utilized including caps, mask, sterile gowns, sterile gloves, large sterile drape, hand hygiene, and chlorhexadine skin prep. An angled catheter was advanced over a wire under fluoroscopic guidance through the nose, down the esophagus and into the body of the stomach. The stomach was then insufflated with several 100 ml  of air. Fluoroscopy confirmed location of the gastric bubble, as well as inferior displacement of the barium stained colon. Under direct fluoroscopic guidance, a single T-tack was placed, and the anterior gastric wall drawn up against the anterior abdominal wall. Percutaneous access was then obtained into the mid gastric body with an 18 gauge sheath needle. Aspiration of air, and injection of contrast material under fluoroscopy confirmed needle placement. An Amplatz wire was advanced in the gastric body and the access needle exchanged for a 9-French vascular sheath. A snare device  was advanced through the vascular sheath and an Amplatz wire advanced through the angled catheter. The Amplatz wire was successfully snared and this was pulled up through the esophagus and out the mouth. A 20-French Alinda Dooms MIC-PEG tube was then connected to the snare and pulled through the mouth, down the esophagus, into the stomach and out to the anterior abdominal wall. Hand injection of contrast material confirmed intragastric location. The T-tack retention suture was then cut. The pull through peg tube was then secured with the external bumper and capped. The patient will be observed for several hours with the newly placed tube on low wall suction to evaluate for any post procedure complication. The patient tolerated the procedure well, there is no immediate complication. IMPRESSION: Successful placement of a 20 French pull through gastrostomy tube. Electronically Signed   By: Jacqulynn Cadet M.D.   On: 09/02/2017 10:24   Dg Chest Port 1 View  Result Date: 08/29/2017 CLINICAL DATA:  Leukocytosis.  History of CHF. EXAM: PORTABLE CHEST 1 VIEW COMPARISON:  08/27/2017 FINDINGS: Right-sided injectable port and feeding catheter in stable position. Enlarged cardiac silhouette. Calcific atherosclerotic disease of the aorta. Mediastinal contours appear intact. There is no evidence of pneumothorax. Left lower lobe airspace consolidation and/or left pleural effusion. Osseous structures are without acute abnormality. Soft tissues are grossly normal. IMPRESSION: Left lower lobe airspace consolidation and/or left pleural effusion, new from patient's prior radiograph. Electronically Signed   By: Fidela Salisbury M.D.   On: 08/29/2017 16:32   Dg Addison Bailey G Tube Plc W/fl-no Rad  Result Date: 09/25/2017 Fluoroscopy was utilized by the requesting physician.  No radiographic interpretation.    Assessment and Plan  Pain management- we will start with tramadol 50 mg twice daily and monitor result  Oral  thrush-we will restart Diflucan 100 mg 2 p.o. first day and 1 p.o. daily for 2 weeks and in addition will be using nystatin oral solution to swab 4 times daily for 2 weeks      Inocencio Homes, MD

## 2017-09-29 ENCOUNTER — Encounter: Payer: Self-pay | Admitting: Internal Medicine

## 2017-09-29 NOTE — Progress Notes (Signed)
Location:   [facility] of Service:   SNF Provider: Hennie Duos MD  [name], MD  Patient Care Team: [name], MD as PCP - General (Internal Medicine)  Extended Emergency Contact Information Primary Emergency Contact: Briceno,Sandrekia Address: [address]          [name], [place] [address] Johnnette Litter of [place] Phone: [phone] Relation: Daughter Secondary Emergency Contact: Braelin, Brosch Mobile Phone: [phone] Relation: Son    Allergies: Codeine; Hydrocodone; and Tramadol hcl  Chief Complaint  Patient presents with  . Acute Visit    HPI: Patient is 77 y.o. 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  This unprecedented rate of deterioration is undoubtedly due to the patient's premorbid condition.  There is no reported fever there is no reported smell redness or signs of infection.  Past Medical History:  Diagnosis Date  . Allergic rhinitis, cause unspecified   . Benign neoplasm of colon   . Chronic diastolic CHF (congestive heart failure) ([place])   . CVA (cerebral vascular accident) ([place]) 05/2017  . Esophageal reflux   . Excessive daytime sleepiness   . 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 GI hemorrhage 2011   recurrent  . Hyperlipidemia   . Osteoporosis   . Polymyositis ([place])   . Unspecified essential hypertension   . Vitamin D deficiency     Past Surgical History:  Procedure Laterality Date  . ABDOMINAL HYSTERECTOMY     partial  . BACK SURGERY    . COLONOSCOPY  01/09/2011   others also  . COLONOSCOPY  05/29/2011   Procedure: COLONOSCOPY;  Surgeon: Gatha Mayer, MD;  Location: [facility] ENDOSCOPY;  Service: Endoscopy;  Laterality: N/A;  Greggory Brandy Carlean Purl  . COLONOSCOPY WITH PROPOFOL N/A 02/16/2014   Procedure: COLONOSCOPY WITH PROPOFOL;  Surgeon: Milus Banister, MD;  Location: [facility] ENDOSCOPY;  Service:  Endoscopy;  Laterality: N/A;  . ESOPHAGOGASTRODUODENOSCOPY  12/08/2010   others also  . ESOPHAGOGASTRODUODENOSCOPY N/A 08/17/2017   Procedure: ESOPHAGOGASTRODUODENOSCOPY (EGD);  Surgeon: Jackquline Denmark, MD;  Location: [facility] ENDOSCOPY;  Service: Endoscopy;  Laterality: N/A;  . ESOPHAGOGASTRODUODENOSCOPY (EGD) WITH PROPOFOL N/A 02/16/2014   Procedure: ESOPHAGOGASTRODUODENOSCOPY (EGD) WITH PROPOFOL;  Surgeon: Milus Banister, MD;  Location: [facility] ENDOSCOPY;  Service: Endoscopy;  Laterality: N/A;  . EUS    . HOT HEMOSTASIS  05/29/2011   Procedure: HOT HEMOSTASIS (ARGON PLASMA COAGULATION/BICAP);  Surgeon: Gatha Mayer, MD;  Location: [facility];  Service: Endoscopy;  Laterality: N/A;  . IR GASTROSTOMY TUBE MOD SED  09/02/2017  . LUMBAR LAMINECTOMY    . ROTATOR CUFF REPAIR Bilateral   . TONSILLECTOMY  age 73    Allergies as of 09/25/2017      Reactions   Codeine Other (See Comments)   Feels drunk   Hydrocodone    "makes me sleepy"   Tramadol Hcl Other (See Comments)   Makes me feel drunk      Medication List        Accurate as of 09/25/17 11:59 PM. Always use your most recent med list.          acetaminophen 160 MG/5ML solution Commonly known as:  TYLENOL Place 20.3 mLs (650 mg total) into feeding tube every 6 (six) hours as needed for mild pain, headache or fever.   allopurinol 100 MG tablet Commonly known  as:  ZYLOPRIM Place 1 tablet (100 mg total) into feeding tube daily.   ALPRAZolam 0.25 MG tablet Commonly known as:  XANAX Place 1 tablet (0.25 mg total) into feeding tube 2 (two) times daily as needed for anxiety (for 14 days.).   aspirin 81 MG chewable tablet Place 1 tablet (81 mg total) into feeding tube daily.   diphenoxylate-atropine 2.5-0.025 MG/5ML liquid Commonly known as:  LOMOTIL Place 5 mLs into feeding tube daily as needed for diarrhea or loose stools.   feeding supplement (OSMOLITE 1.5 CAL) Liqd Place 237 mLs into feeding tube 4 (four) times daily.   feeding  supplement (PRO-STAT SUGAR FREE 64) Liqd Place 30 mLs into feeding tube 2 (two) times daily.   free water Soln Place 300 mLs into feeding tube every 8 (eight) hours.   metoprolol tartrate 25 mg/10 mL Susp Commonly known as:  LOPRESSOR Place 20 mLs (50 mg total) into feeding tube 2 (two) times daily.   mycophenolate 50 mg/mL Susp oral suspension Commonly known as:  CELLCEPT Place 20 mLs (1,000 mg total) into feeding tube 2 (two) times daily.   pantoprazole sodium 40 mg/20 mL Pack Commonly known as:  PROTONIX Place 20 mLs (40 mg total) into feeding tube daily.   potassium chloride 20 MEQ/15ML (10%) Soln Place 7.5 mLs (10 mEq total) into feeding tube daily.   predniSONE 5 MG/ML concentrated solution Place 4 mLs (20 mg total) into feeding tube every 12 (twelve) hours.       No orders of the defined types were placed in this encounter.   Immunization History  Administered Date(s) Administered  . Influenza Split 10/07/2011  . Influenza Whole 10/13/2008    Social History   Tobacco Use  . Smoking status: Never Smoker  . Smokeless tobacco: Former Systems developer    Types: Snuff  . Tobacco comment: 07/21/2017 "no snuff  since ~ 04/2017"  Substance Use Topics  . Alcohol use: No    Review of Systems unable to obtain secondary to patient's nonverbal status; nurse-as per history present illness   Vitals:   09/29/17 2121  BP: (!) 161/98  Pulse: 81  Resp: 18  Temp: (!) 96.8 F (36 C)   Body mass index is 22.86 kg/m. Physical Exam  GENERAL APPEARANCE: Alert, nonconversant: Appears uncomfortable SKIN:, Describes-4 x 6 with slough tissue stage III sacral ulcer HEENT: Unremarkable RESPIRATORY: Breathing is even, unlabored. Lung sounds are clear   CARDIOVASCULAR: Heart RRR no murmurs, rubs or gallops. No peripheral edema  GASTROINTESTINAL: Abdomen is soft, non-tender, not distended w/ normal bowel sounds.  GENITOURINARY: Bladder non tender, not distended  MUSCULOSKELETAL: No  abnormal joints or musculature NEUROLOGIC: Status post hypoxic brain injury, right side paresis PSYCHIATRIC: Not applicable  Patient Active Problem List   Diagnosis Date Noted  . History of lower GI bleeding 09/13/2017  . Acute respiratory failure with hypoxia ([place]) 09/13/2017  . Hypoxic brain injury ([place]) 09/13/2017  . Dysphagia 09/13/2017  . Status post insertion of percutaneous endoscopic gastrostomy (PEG) tube ([place]) 09/13/2017  . Thrush, oral 09/13/2017  . Diarrhea due to malabsorption 09/13/2017  . Acute upper GI bleed 08/17/2017  . Nontraumatic hemorrhagic shock ([place]) 08/17/2017  . Hyperlipidemia 07/26/2017  . Pressure ulcer of buttock 07/22/2017  . Neck mass 07/21/2017  . Neuropathic pain   . 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 Neurologic deficit due to acute ischemic stroke ([place])   . Hypokalemia   . CVA (cerebral vascular accident) ([place]) 06/09/2017  . Multiple  pulmonary nodules 06/09/2017  . Hypothermia 10/22/2016  . Chronic diastolic CHF (congestive heart failure) ([place]) 10/22/2016  . Abdominal pain, unspecified site 05/17/2013  . Nausea alone 05/17/2013  . 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 Abdominal pain, left lower quadrant 05/28/2011  . Cecal ulcer with hemorrhage 01/10/2011  . Acute blood loss anemia 12/06/2010  . Anemia 12/05/2010  . Adrenal insufficiency ([place]) 12/05/2010  . Osteoporosis 12/05/2010  . LUNG INVOLVEMENT OTHER DISEASES CLASSIFIED ELSW 08/13/2009  . OTHER SPECIFIED DISORDER OF STOMACH AND DUODENUM 07/06/2008  . Essential hypertension 04/26/2007  . ALLERGIC RHINITIS 04/26/2007  . GASTROESOPHAGEAL REFLUX DISEASE 04/26/2007  . Polymyositis ([place]) 04/26/2007  . COLONIC POLYPS, ADENOMATOUS 02/06/2005    CMP     Component Value Date/Time   NA 137 09/04/2017 0422   NA 141 08/15/2017   K 4.1 09/04/2017 0422   CL 105 09/04/2017 0422   CO2 25 09/04/2017 0422   GLUCOSE 138 (H) 09/04/2017 0422    BUN 13 09/04/2017 0422   BUN 31 (A) 08/15/2017   CREATININE 0.36 (L) 09/04/2017 0422   CALCIUM 8.4 (L) 09/04/2017 0422   PROT 4.5 (L) 09/02/2017 1157   ALBUMIN 2.3 (L) 09/02/2017 1157   AST 27 09/02/2017 1157   ALT 36 09/02/2017 1157   ALKPHOS 50 09/02/2017 1157   BILITOT 0.7 09/02/2017 1157   GFRNONAA >60 09/04/2017 0422   GFRAA >60 09/04/2017 0422   Recent Labs    08/21/17 0436  08/25/17 0414 08/26/17 0340 08/28/17 1134  09/02/17 1157 09/03/17 0323 09/04/17 0422  NA 143   < > 138 139 140   < > 142 138 137  K 3.3*   < > 3.0* 3.8 4.7   < > 3.0* 3.7 4.1  CL 107   < > 100 103 105   < > 107 104 105  CO2 26   < > 28 27 27    < > 26 27 25   GLUCOSE 79   < > 206* 111* 104*   < > 179* 86 138*  BUN 9   < > 5* 10 13   < > 19 12 13   CREATININE 0.34*   < > 0.47 0.43* 0.43*   < > 0.44 0.39* 0.36*  CALCIUM 8.5*   < > 8.2* 8.4* 8.7*   < > 8.0* 8.1* 8.4*  MG 1.7  --  1.7 2.3 2.1  --  2.1  --   --   PHOS 2.5  --  2.4* 2.3*  --   --   --   --   --    < > = values in this interval not displayed.   Recent Labs    08/25/17 0414 08/26/17 0340 09/02/17 1157  AST 28 29 27   ALT 25 26 36  ALKPHOS 42 53 50  BILITOT 0.8 0.6 0.7  PROT 4.6* 5.2* 4.5*  ALBUMIN 2.5* 2.7* 2.3*   Recent Labs    08/20/17 0428  08/22/17 0425 08/23/17 0415  09/02/17 0657 09/03/17 0323 09/04/17 0422  WBC 15.6*   < > 10.2 8.8   < > 9.7 11.4* 10.5  NEUTROABS 13.9*  --  8.0* 7.0  --   --   --   --   HGB 9.0*   < > 10.3* 10.1*   < > 10.6* 10.0* 11.2*  HCT 27.4*   < > 31.8* 31.2*   < > 34.9* 32.1* 35.3*  MCV 80.8   < > 80.7 81.3   < >  88.1 86.1 85.3  PLT 212   < > 221 248   < > 357 317 382   < > = values in this interval not displayed.   Recent Labs    06/10/17 0545  CHOL 299*  LDLCALC 164*  TRIG 347*   No results found for: [facility] Lab Results  Component Value Date   TSH 1.080 10/22/2016   Lab Results  Component Value Date   HGBA1C 4.7 (L) 06/10/2017   Lab Results  Component Value Date   CHOL  299 (H) 06/10/2017   HDL 66 06/10/2017   LDLCALC 164 (H) 06/10/2017   TRIG 347 (H) 06/10/2017   CHOLHDL 4.5 06/10/2017    Significant Diagnostic Results in last 30 days:  Dg Abd 1 View  Result Date: 09/01/2017 CLINICAL DATA:  Evaluate feeding tube EXAM: ABDOMEN - 1 VIEW COMPARISON:  08/23/2017 FINDINGS: Single fluoroscopic image of the abdomen. According to study nodes, feeding tube with unclogged in radiology by guidewire. Total fluoroscopy time was 24 seconds. Subsegmental atelectasis at the right base. Probable tiny left effusion. Tip of the feeding tube projects over the distal stomach. IMPRESSION: Tip of feeding tube projects over the distal stomach Electronically Signed   By: Donavan Foil M.D.   On: 09/01/2017 19:59   Ct Head Wo Contrast  Result Date: 09/02/2017 CLINICAL DATA:  Encephalopathy EXAM: CT HEAD WITHOUT CONTRAST TECHNIQUE: Contiguous axial images were obtained from the base of the skull through the vertex without intravenous contrast. COMPARISON:  08/19/2017 FINDINGS: Brain: No findings to suggest acute hemorrhage, acute infarction or space-occupying mass lesion are noted. Marked bilateral chronic white matter ischemic change is noted stable from the prior exam. Vascular: No hyperdense vessel or unexpected calcification. Skull: Normal. Negative for fracture or focal lesion. Sinuses/Orbits: No acute finding. Other: None. IMPRESSION: Chronic white matter ischemic changes and atrophic changes stable from the prior exam. No acute abnormality is noted. Electronically Signed   By: Inez Catalina M.D.   On: 09/02/2017 19:45   Ir Gastrostomy Tube Mod Sed  Result Date: 09/02/2017 INDICATION: 77 year old female with dysphagia. She is currently taking enteral nutrition via a transpyloric feeding tube and presents for placement of a more durable percutaneous gastrostomy tube. EXAM: Fluoroscopically guided placement of percutaneous pull-through gastrostomy tube Interventional Radiologist:  Criselda Peaches, MD MEDICATIONS: 2 g Ancef; Antibiotics were administered within 1 hour of the procedure. ANESTHESIA/SEDATION: Versed 2 mg IV; Fentanyl 50 mcg IV Moderate Sedation Time:  10 minutes The patient was continuously monitored during the procedure by the interventional radiology nurse under my direct supervision. CONTRAST:  15 mL Isovue-300 FLUOROSCOPY TIME:  Fluoroscopy Time: 1 minutes 54 seconds (7 mGy). COMPLICATIONS: None immediate. PROCEDURE: Informed written consent was obtained from the patient after a thorough discussion of the procedural risks, benefits and alternatives. All questions were addressed. Maximal Sterile Barrier Technique was utilized including caps, mask, sterile gowns, sterile gloves, sterile drape, hand hygiene and skin antiseptic. A timeout was performed prior to the initiation of the procedure. Maximal barrier sterile technique utilized including caps, mask, sterile gowns, sterile gloves, large sterile drape, hand hygiene, and chlorhexadine skin prep. An angled catheter was advanced over a wire under fluoroscopic guidance through the nose, down the esophagus and into the body of the stomach. The stomach was then insufflated with several 100 ml of air. Fluoroscopy confirmed location of the gastric bubble, as well as inferior displacement of the barium stained colon. Under direct fluoroscopic guidance, a single T-tack was placed, and  the anterior gastric wall drawn up against the anterior abdominal wall. Percutaneous access was then obtained into the mid gastric body with an 18 gauge sheath needle. Aspiration of air, and injection of contrast material under fluoroscopy confirmed needle placement. An Amplatz wire was advanced in the gastric body and the access needle exchanged for a 9-French vascular sheath. A snare device was advanced through the vascular sheath and an Amplatz wire advanced through the angled catheter. The Amplatz wire was successfully snared and this was pulled up  through the esophagus and out the mouth. A 20-French Alinda Dooms MIC-PEG tube was then connected to the snare and pulled through the mouth, down the esophagus, into the stomach and out to the anterior abdominal wall. Hand injection of contrast material confirmed intragastric location. The T-tack retention suture was then cut. The pull through peg tube was then secured with the external bumper and capped. The patient will be observed for several hours with the newly placed tube on low wall suction to evaluate for any post procedure complication. The patient tolerated the procedure well, there is no immediate complication. IMPRESSION: Successful placement of a 20 French pull through gastrostomy tube. Electronically Signed   By: Jacqulynn Cadet M.D.   On: 09/02/2017 10:24   Dg Addison Bailey G Tube Plc W/fl-no Rad  Result Date: 09/25/2017 Fluoroscopy was utilized by the requesting physician.  No radiographic interpretation.    Assessment and Plan  Merrilyn Puma ulcer /hypoxic brain injury/nontraumatic hemorrhagic shock- superficial shear injury that has become a stage III ulcer and 3 days; patient is failing to thrive and premorbid secondary to hypoxic brain injury from hemorrhagic shock; will start morphine 5 mg every 4 scheduled for pain and continue supportive care.    Spent greater than 35 minutes Inocencio Homes, MD

## 2017-09-30 ENCOUNTER — Encounter: Payer: Self-pay | Admitting: Internal Medicine

## 2017-09-30 ENCOUNTER — Non-Acute Institutional Stay (SKILLED_NURSING_FACILITY): Payer: Medicare Other | Admitting: Internal Medicine

## 2017-09-30 ENCOUNTER — Encounter: Payer: Medicare Other | Admitting: Internal Medicine

## 2017-09-30 DIAGNOSIS — R52 Pain, unspecified: Secondary | ICD-10-CM

## 2017-09-30 DIAGNOSIS — Z7189 Other specified counseling: Secondary | ICD-10-CM | POA: Diagnosis not present

## 2017-09-30 DIAGNOSIS — L89154 Pressure ulcer of sacral region, stage 4: Secondary | ICD-10-CM | POA: Diagnosis not present

## 2017-09-30 DIAGNOSIS — G931 Anoxic brain damage, not elsewhere classified: Secondary | ICD-10-CM

## 2017-09-30 DIAGNOSIS — Z515 Encounter for palliative care: Secondary | ICD-10-CM

## 2017-09-30 NOTE — Progress Notes (Signed)
:    Location:  [facility] Room Number: 884Z Place of Service:  SNF (31)  Ignace Mandigo D. Sheppard Coil, MD  Patient Care Team: [name], MD as PCP - General (Internal Medicine)  Extended Emergency Contact Information Primary Emergency Contact: [name] Address: [address]          [name], [place] [address] Johnnette Litter of [place] Phone: [phone] Relation: Daughter Secondary Emergency Contact: Liller, Yohn Mobile Phone: [phone] Relation: Son     Allergies: Codeine; Hydrocodone; and Tramadol hcl  Chief Complaint  Patient presents with  . Acute Visit    Family Meeting    HPI: Patient is 77 y.o. female his youngest daughter is her primary caretaker but who has 3 other children.  Patient is being seen today for 2 reasons, one is for pain control and the other is to discuss with patient's daughter something of the meeting that is set up in 2 days with her siblings.  In the time that we are speaking with patient's daughter I am also evaluating the patient and her response to the 5 mg of morphine every 4 hours that was ordered several days ago.  Does not appear that patient's pain is being adequately controlled.  Patient with hypoxic brain injury, she cannot speak for herself, although she does understand a great deal of what is being said to her.  Past Medical History:  Diagnosis Date  . Allergic rhinitis, cause unspecified   . Benign neoplasm of colon   . Chronic diastolic CHF (congestive heart failure) ([place])   . CVA (cerebral vascular accident) ([place]) 05/2017  . Esophageal reflux   . Excessive daytime sleepiness   . 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 GI hemorrhage 2011   recurrent  . Hyperlipidemia   . Osteoporosis   . Polymyositis ([place])   . Unspecified essential hypertension   . Vitamin D deficiency     Past Surgical History:  Procedure Laterality Date  . ABDOMINAL HYSTERECTOMY     partial  . BACK  SURGERY    . COLONOSCOPY  01/09/2011   others also  . COLONOSCOPY  05/29/2011   Procedure: COLONOSCOPY;  Surgeon: Gatha Mayer, MD;  Location: [facility];  Service: Endoscopy;  Laterality: N/A;  Greggory Brandy Carlean Purl  . COLONOSCOPY WITH PROPOFOL N/A 02/16/2014   Procedure: COLONOSCOPY WITH PROPOFOL;  Surgeon: Milus Banister, MD;  Location: [facility];  Service: Endoscopy;  Laterality: N/A;  . ESOPHAGOGASTRODUODENOSCOPY  12/08/2010   others also  . ESOPHAGOGASTRODUODENOSCOPY N/A 08/17/2017   Procedure: ESOPHAGOGASTRODUODENOSCOPY (EGD);  Surgeon: Jackquline Denmark, MD;  Location: [facility]OSCOPY;  Service: Endoscopy;  Laterality: N/A;  . ESOPHAGOGASTRODUODENOSCOPY (EGD) WITH PROPOFOL N/A 02/16/2014   Procedure: ESOPHAGOGASTRODUODENOSCOPY (EGD) WITH PROPOFOL;  Surgeon: Milus Banister, MD;  Location: [facility];  Service: Endoscopy;  Laterality: N/A;  . EUS    . HOT HEMOSTASIS  05/29/2011   Procedure: HOT HEMOSTASIS (ARGON PLASMA COAGULATION/BICAP);  Surgeon: Gatha Mayer, MD;  Location: [facility];  Service: Endoscopy;  Laterality: N/A;  . IR GASTROSTOMY TUBE MOD SED  09/02/2017  . LUMBAR LAMINECTOMY    . ROTATOR CUFF REPAIR Bilateral   . TONSILLECTOMY  age 77    Allergies as of 09/30/2017      Reactions   Codeine Other (See Comments)   Feels drunk   Hydrocodone    "makes me sleepy"   Tramadol Hcl Other (See Comments)   Makes me feel drunk  Medication List        Accurate as of 09/30/17  3:53 PM. Always use your most recent med list.          acetaminophen 160 MG/5ML solution Commonly known as:  TYLENOL Place 20.3 mLs (650 mg total) into feeding tube every 6 (six) hours as needed for mild pain, headache or fever.   allopurinol 100 MG tablet Commonly known as:  ZYLOPRIM Place 1 tablet (100 mg total) into feeding tube daily.   ALPRAZolam 0.25 MG tablet Commonly known as:  XANAX Place 1 tablet (0.25 mg total) into feeding tube 2 (two) times daily as needed for anxiety (for 14  days.).   amLODipine 5 MG tablet Commonly known as:  NORVASC Take 5 mg by mouth daily.   aspirin 81 MG chewable tablet Place 1 tablet (81 mg total) into feeding tube daily.   diphenoxylate-atropine 2.5-0.025 MG/5ML liquid Commonly known as:  LOMOTIL Place 5 mLs into feeding tube daily as needed for diarrhea or loose stools.   feeding supplement (OSMOLITE 1.5 CAL) Liqd Place 237 mLs into feeding tube 4 (four) times daily.   feeding supplement (PRO-STAT SUGAR FREE 64) Liqd Place 30 mLs into feeding tube 2 (two) times daily.   free water Soln Place 300 mLs into feeding tube every 8 (eight) hours.   metoprolol tartrate 25 mg/10 mL Susp Commonly known as:  LOPRESSOR Place 20 mLs (50 mg total) into feeding tube 2 (two) times daily.   morphine 20 MG/ML concentrated solution Commonly known as:  ROXANOL Take by mouth. GiVE 0.25ML (5MG ) Q4HR FOR PAIN AGITATION AND SHORTNESS OF BREATH   mycophenolate 50 mg/mL Susp oral suspension Commonly known as:  CELLCEPT Place 20 mLs (1,000 mg total) into feeding tube 2 (two) times daily.   pantoprazole sodium 40 mg/20 mL Pack Commonly known as:  PROTONIX Place 20 mLs (40 mg total) into feeding tube daily.   potassium chloride 20 MEQ/15ML (10%) Soln Place 7.5 mLs (10 mEq total) into feeding tube daily.   predniSONE 5 MG/ML concentrated solution Place 4 mLs (20 mg total) into feeding tube every 12 (twelve) hours.       No orders of the defined types were placed in this encounter.   Immunization History  Administered Date(s) Administered  . Influenza Split 10/07/2011  . Influenza Whole 10/13/2008    Social History   Tobacco Use  . Smoking status: Never Smoker  . Smokeless tobacco: Former Systems developer    Types: Snuff  . Tobacco comment: 07/21/2017 "no snuff  since ~ 04/2017"  Substance Use Topics  . Alcohol use: No    Family history is   Family History  Problem Relation Age of Onset  . Dementia Mother   . Hypertension Father   .  Malignant hyperthermia Father   . 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     Physical Exam  GENERAL APPEARANCE: Alert, non-conversant, appears moderately uncomfortable especially in the GU area SKIN: Not visualized but known to have  a daily ulcer in the sacrum HEAD: Normocephalic, atraumatic  EYES: Conjunctiva/lids clear. Pupils round, reactive. EOMs intact.  EARS: External exam WNL, canals clear. Hearing grossly normal.  NOSE: No deformity or discharge.  MOUTH/THROAT: Lips w/o lesions  RESPIRATORY: Breathing is even, unlabored. Lung sounds are clear   CARDIOVASCULAR: Heart RRR no murmurs, rubs or gallops. No peripheral edema.   GASTROINTESTINAL: Abdomen is soft, non-tender, not distended w/ normal bowel sounds me: PEG tube. GENITOURINARY: Bladder non tender, not distended  MUSCULOSKELETAL: No abnormal joints or musculature NEUROLOGIC: With hypoxic brain injury; dysphasia and expressive a aphasia, can move right upper extremity some, no movement of left side PSYCHIATRIC: Patient presents is very unhappy  Patient Active Problem List   Diagnosis Date Noted  . History of lower GI bleeding 09/13/2017  . Acute respiratory failure with hypoxia ([place]) 09/13/2017  . Hypoxic brain injury ([place]) 09/13/2017  . Dysphagia 09/13/2017  . Status post insertion of percutaneous endoscopic gastrostomy (PEG) tube ([place]) 09/13/2017  . Thrush, oral 09/13/2017  . Diarrhea due to malabsorption 09/13/2017  . Acute upper GI bleed 08/17/2017  . Nontraumatic hemorrhagic shock ([place]) 08/17/2017  .  Hyperlipidemia 07/26/2017  . Pressure ulcer of buttock 07/22/2017  . Neck mass 07/21/2017  . Neuropathic pain   . 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 Neurologic deficit due to acute ischemic stroke ([place])   . Hypokalemia   . CVA (cerebral vascular accident) ([place]) 06/09/2017  . Multiple pulmonary nodules 06/09/2017  . Hypothermia 10/22/2016  . Chronic diastolic CHF (congestive heart failure) ([place]) 10/22/2016  . Abdominal pain, unspecified site 05/17/2013  . Nausea alone 05/17/2013  . 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 Abdominal pain, left lower quadrant 05/28/2011  . Cecal ulcer with hemorrhage 01/10/2011  . Acute blood loss anemia 12/06/2010  . Anemia 12/05/2010  . Adrenal insufficiency ([place]) 12/05/2010  . Osteoporosis 12/05/2010  . LUNG INVOLVEMENT OTHER DISEASES CLASSIFIED ELSW 08/13/2009  . OTHER SPECIFIED DISORDER OF STOMACH AND DUODENUM 07/06/2008  . Essential hypertension 04/26/2007  . ALLERGIC RHINITIS 04/26/2007  . GASTROESOPHAGEAL REFLUX DISEASE 04/26/2007  . Polymyositis ([place]) 04/26/2007  . COLONIC POLYPS, ADENOMATOUS 02/06/2005      Labs reviewed: Basic Metabolic Panel:    Component Value Date/Time   NA 137 09/04/2017 0422   NA 141 08/15/2017   K 4.1 09/04/2017 0422   CL 105 09/04/2017 0422   CO2 25 09/04/2017 0422   GLUCOSE 138 (H) 09/04/2017 0422   BUN 13 09/04/2017 0422   BUN 31 (A) 08/15/2017   CREATININE 0.36 (L) 09/04/2017 0422   CALCIUM 8.4 (L) 09/04/2017 0422   PROT 4.5 (L) 09/02/2017 1157   ALBUMIN 2.3 (L) 09/02/2017 1157   AST 27 09/02/2017 1157   ALT 36 09/02/2017 1157   ALKPHOS 50 09/02/2017 1157   BILITOT 0.7 09/02/2017 1157   GFRNONAA >60 09/04/2017 0422   GFRAA >60 09/04/2017 0422    Recent Labs    08/21/17 0436  08/25/17 0414 08/26/17 0340 08/28/17 1134  09/02/17 1157 09/03/17 0323 09/04/17 0422  NA 143   < > 138 139 140   < > 142 138 137  K 3.3*   < > 3.0* 3.8 4.7   < >  3.0* 3.7 4.1  CL 107   < > 100 103 105   < > 107 104 105  CO2 26   < > 28 27 27    < > 26 27 25   GLUCOSE 79   < >  206* 111* 104*   < > 179* 86 138*  BUN 9   < > 5* 10 13   < > 19 12 13   CREATININE 0.34*   < > 0.47 0.43* 0.43*   < > 0.44 0.39* 0.36*  CALCIUM 8.5*   < > 8.2* 8.4* 8.7*   < > 8.0* 8.1* 8.4*  MG 1.7  --  1.7 2.3 2.1  --  2.1  --   --   PHOS 2.5  --  2.4* 2.3*  --   --   --   --   --    < > = values in this interval not displayed.   Liver Function Tests: Recent Labs    08/25/17 0414 08/26/17 0340 09/02/17 1157  AST 28 29 27   ALT 25 26 36  ALKPHOS 42 53 50  BILITOT 0.8 0.6 0.7  PROT 4.6* 5.2* 4.5*  ALBUMIN 2.5* 2.7* 2.3*   Recent Labs    05/15/17 1048  LIPASE 33   No results for input(s): AMMONIA in the last 8760 hours. CBC: Recent Labs    08/20/17 0428  08/22/17 0425 08/23/17 0415  09/02/17 0657 09/03/17 0323 09/04/17 0422  WBC 15.6*   < > 10.2 8.8   < > 9.7 11.4* 10.5  NEUTROABS 13.9*  --  8.0* 7.0  --   --   --   --   HGB 9.0*   < > 10.3* 10.1*   < > 10.6* 10.0* 11.2*  HCT 27.4*   < > 31.8* 31.2*   < > 34.9* 32.1* 35.3*  MCV 80.8   < > 80.7 81.3   < > 88.1 86.1 85.3  PLT 212   < > 221 248   < > 357 317 382   < > = values in this interval not displayed.   Lipid Recent Labs    06/10/17 0545  CHOL 299*  HDL 66  LDLCALC 164*  TRIG 347*    Cardiac Enzymes: Recent Labs    10/22/16 1600  01/02/17 1415  07/21/17 0907 07/22/17 0500 07/23/17 0401  CKTOTAL  --    < >  --    < > 2,750* 2,096* 1,552*  TROPONINI <0.03  --  <0.03  --   --   --   --    < > = values in this interval not displayed.   BNP: No results for input(s): BNP in the last 8760 hours. No results found for: [facility] Lab Results  Component Value Date   HGBA1C 4.7 (L) 06/10/2017   Lab Results  Component Value Date   TSH 1.080 10/22/2016   Lab Results  Component Value Date   VITAMINB12 1,377 (H) 08/25/2017   Lab Results  Component Value Date   FOLATE 25.4 08/25/2017     Lab Results  Component Value Date   IRON 55 05/30/2011   TIBC 266 05/30/2011   FERRITIN 68 05/30/2011    Imaging and Procedures obtained prior to SNF admission: Ct Head Wo Contrast  Result Date: 09/02/2017 CLINICAL DATA:  Encephalopathy EXAM: CT HEAD WITHOUT CONTRAST TECHNIQUE: Contiguous axial images were obtained from the base of the skull through the vertex without intravenous contrast. COMPARISON:  08/19/2017 FINDINGS: Brain: No findings to suggest acute hemorrhage, acute infarction or space-occupying mass lesion are noted. Marked bilateral chronic white matter ischemic change is noted stable from the prior exam. Vascular: No hyperdense vessel or unexpected calcification. Skull: Normal. Negative for fracture or focal lesion. Sinuses/Orbits: No acute  finding. Other: None. IMPRESSION: Chronic white matter ischemic changes and atrophic changes stable from the prior exam. No acute abnormality is noted. Electronically Signed   By: Inez Catalina M.D.   On: 09/02/2017 19:45   Ir Gastrostomy Tube Mod Sed  Result Date: 09/02/2017 INDICATION: 77 year old female with dysphagia. She is currently taking enteral nutrition via a transpyloric feeding tube and presents for placement of a more durable percutaneous gastrostomy tube. EXAM: Fluoroscopically guided placement of percutaneous pull-through gastrostomy tube Interventional Radiologist:  Criselda Peaches, MD MEDICATIONS: 2 g Ancef; Antibiotics were administered within 1 hour of the procedure. ANESTHESIA/SEDATION: Versed 2 mg IV; Fentanyl 50 mcg IV Moderate Sedation Time:  10 minutes The patient was continuously monitored during the procedure by the interventional radiology nurse under my direct supervision. CONTRAST:  15 mL Isovue-300 FLUOROSCOPY TIME:  Fluoroscopy Time: 1 minutes 54 seconds (7 mGy). COMPLICATIONS: None immediate. PROCEDURE: Informed written consent was obtained from the patient after a thorough discussion of the procedural risks,  benefits and alternatives. All questions were addressed. Maximal Sterile Barrier Technique was utilized including caps, mask, sterile gowns, sterile gloves, sterile drape, hand hygiene and skin antiseptic. A timeout was performed prior to the initiation of the procedure. Maximal barrier sterile technique utilized including caps, mask, sterile gowns, sterile gloves, large sterile drape, hand hygiene, and chlorhexadine skin prep. An angled catheter was advanced over a wire under fluoroscopic guidance through the nose, down the esophagus and into the body of the stomach. The stomach was then insufflated with several 100 ml of air. Fluoroscopy confirmed location of the gastric bubble, as well as inferior displacement of the barium stained colon. Under direct fluoroscopic guidance, a single T-tack was placed, and the anterior gastric wall drawn up against the anterior abdominal wall. Percutaneous access was then obtained into the mid gastric body with an 18 gauge sheath needle. Aspiration of air, and injection of contrast material under fluoroscopy confirmed needle placement. An Amplatz wire was advanced in the gastric body and the access needle exchanged for a 9-French vascular sheath. A snare device was advanced through the vascular sheath and an Amplatz wire advanced through the angled catheter. The Amplatz wire was successfully snared and this was pulled up through the esophagus and out the mouth. A 20-French Alinda Dooms MIC-PEG tube was then connected to the snare and pulled through the mouth, down the esophagus, into the stomach and out to the anterior abdominal wall. Hand injection of contrast material confirmed intragastric location. The T-tack retention suture was then cut. The pull through peg tube was then secured with the external bumper and capped. The patient will be observed for several hours with the newly placed tube on low wall suction to evaluate for any post procedure complication. The patient  tolerated the procedure well, there is no immediate complication. IMPRESSION: Successful placement of a 20 French pull through gastrostomy tube. Electronically Signed   By: Jacqulynn Cadet M.D.   On: 09/02/2017 10:24     Not all labs, radiology exams or other studies done during hospitalization come through on my EPIC note; however they are reviewed by me.    Assessment and Plan  Hypoxic brain injury/sacral Merrilyn Puma ulcer/pain control- Shirley from palliative care and I discussed; have ordered Foley's I feel like a large amount of her discomfort is from being wet with urine on her ulcer; also increasing patient's morphine to 10 mg every 3 hours scheduled; will monitor response  Encounter for family conference with patient present- present was  patient's youngest daughter, myself, Enid Derry from palliative care and patient herself me: We discussed who might be at the meeting on Friday and how the conversation might go and some other things that we wish to discuss with the family; youngest daughter is in agreement and also very grateful that we are communicating with her family directly instead of she herself having to communicate to her family   Time spent greater than 35 minutes Webb Silversmith D. Sheppard Coil, MD

## 2017-10-02 ENCOUNTER — Encounter: Payer: Self-pay | Admitting: Internal Medicine

## 2017-10-02 ENCOUNTER — Non-Acute Institutional Stay (SKILLED_NURSING_FACILITY): Payer: Medicare Other | Admitting: Internal Medicine

## 2017-10-02 ENCOUNTER — Non-Acute Institutional Stay: Payer: Medicare Other | Admitting: Internal Medicine

## 2017-10-02 DIAGNOSIS — G931 Anoxic brain damage, not elsewhere classified: Secondary | ICD-10-CM

## 2017-10-02 DIAGNOSIS — L89154 Pressure ulcer of sacral region, stage 4: Secondary | ICD-10-CM

## 2017-10-02 DIAGNOSIS — R578 Other shock: Secondary | ICD-10-CM | POA: Diagnosis not present

## 2017-10-02 DIAGNOSIS — L89312 Pressure ulcer of right buttock, stage 2: Secondary | ICD-10-CM | POA: Diagnosis not present

## 2017-10-02 DIAGNOSIS — I6930 Unspecified sequelae of cerebral infarction: Secondary | ICD-10-CM

## 2017-10-02 DIAGNOSIS — Z7189 Other specified counseling: Secondary | ICD-10-CM

## 2017-10-02 DIAGNOSIS — S31000A Unspecified open wound of lower back and pelvis without penetration into retroperitoneum, initial encounter: Secondary | ICD-10-CM

## 2017-10-02 DIAGNOSIS — Z710 Person encountering health services to consult on behalf of another person: Secondary | ICD-10-CM

## 2017-10-02 DIAGNOSIS — E46 Unspecified protein-calorie malnutrition: Secondary | ICD-10-CM

## 2017-10-02 NOTE — Progress Notes (Signed)
PALLIATIVE CARE CONSULT VISIT   PATIENT NAME: [name] [name] DOB: 10/08/1940 MRN: [id number]  PRIMARY CARE PROVIDER:   [name], MD  REFERRING PROVIDER: Dr. Inocencio Homes- [facility]  RESPONSIBLE PARTY:   Daughter      RECOMMENDATIONS and PLAN:  1.  Protein calorie malnutrition E 46:  Apparent weight loss and development of Kennedy wound despite continuous tube feedings.   No life sustaining with current trajectory of decline. Recommend comfort care.  2.  Wound of sacral region  S31.000:  Terminal wound with progressive worsening.  Continue localized wound care treatments.  Additional progression expected with poor nutritional status and state of overall health. Increase Morphine to 3mg  sl q3hrs for improved control of pain.  3. Counseling regaurding advanced care planning and goals of care Z71.89:  Joint meeting with patient's children and Dr. Sheppard Coil.  Lengthy explanation of pt status and trajectory of decline towards end of life. Questions answered.  Recommendations of comfort care at facility to include Hospice care.  Children in agreement and their primary goal is to provide comfort to patient.  Referral will be made by Dr. Sheppard Coil to [facility].  Support given.  I spent 75 minutes providing this consultation,  from 12:00pm to 1:45pm at [facility].. More than 50% of the time in this consultation was spent coordinating communication to patient, family, Dr. Sheppard Coil, clinical staff and SW.   HISTORY OF PRESENT ILLNESS: Followup and family meeting for  Ricardo J Gayler S He continues to have additional worsening of function, wound of her sacrum and R buttock per wound care nurse.  She remains total care, non-verbal except moaning.  An occasional single word is mumbled.  Noticeable weight loss.  Tube feedings continue.  CODE STATUS:  DNAR/DNI  PPS: 0%  Continuous tube feedings only and mouth care  HOSPICE ELIGIBILITY/DIAGNOSIS: TBD  PAST MEDICAL HISTORY:  Past Medical  History:  Diagnosis Date  . Allergic rhinitis, cause unspecified   . Benign neoplasm of colon   . Chronic diastolic CHF (congestive heart failure) ([place])   . CVA (cerebral vascular accident) ([place]) 05/2017  . Esophageal reflux   . Excessive daytime sleepiness   . 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 GI hemorrhage 2011   recurrent  . Hyperlipidemia   . Osteoporosis   . Polymyositis ([place])   . Unspecified essential hypertension   . Vitamin D deficiency     SOCIAL HX:  Social History   Tobacco Use  . Smoking status: Never Smoker  . Smokeless tobacco: Former Systems developer    Types: Snuff  . Tobacco comment: 07/21/2017 "no snuff  since ~ 04/2017"  Substance Use Topics  . Alcohol use: No    ALLERGIES:  Allergies  Allergen Reactions  . Codeine Other (See Comments)    Feels drunk  . Hydrocodone     "makes me sleepy"  . Tramadol Hcl Other (See Comments)    Makes me feel drunk     PERTINENT MEDICATIONS:  Outpatient Encounter Medications as of 10/02/2017  Medication Sig  . acetaminophen (TYLENOL) 160 MG/5ML solution Place 20.3 mLs (650 mg total) into feeding tube every 6 (six) hours as needed for mild pain, headache or fever.  Marland Kitchen allopurinol (ZYLOPRIM) 100 MG tablet Place 1 tablet (100 mg total) into feeding tube daily.  Marland Kitchen ALPRAZolam (XANAX) 0.25 MG tablet Place 1 tablet (0.25 mg total) into feeding tube 2 (two) times daily as needed for anxiety (for 14 days.).  Marland Kitchen  Amino Acids-Protein Hydrolys (FEEDING SUPPLEMENT, PRO-STAT SUGAR FREE 64,) LIQD Place 30 mLs into feeding tube 2 (two) times daily.  Marland Kitchen amLODipine (NORVASC) 5 MG tablet Take 5 mg by mouth daily.  Marland Kitchen aspirin 81 MG chewable tablet Place 1 tablet (81 mg total) into feeding tube daily.  . diphenoxylate-atropine (LOMOTIL) 2.5-0.025 MG/5ML liquid Place 5 mLs into feeding tube daily as needed for diarrhea or loose stools.  . metoprolol tartrate (LOPRESSOR) 25 mg/10 mL SUSP Place 20 mLs (50 mg total) into feeding tube 2 (two) times  daily.  Marland Kitchen morphine (ROXANOL) 20 MG/ML concentrated solution Take by mouth. GiVE 0.25ML (5MG ) Q4HR FOR PAIN AGITATION AND SHORTNESS OF BREATH  . mycophenolate (CELLCEPT) 50 mg/mL SUSP oral suspension Place 20 mLs (1,000 mg total) into feeding tube 2 (two) times daily.  . Nutritional Supplements (FEEDING SUPPLEMENT, OSMOLITE 1.5 CAL,) LIQD Place 237 mLs into feeding tube 4 (four) times daily.  . pantoprazole sodium (PROTONIX) 40 mg/20 mL PACK Place 20 mLs (40 mg total) into feeding tube daily.  . potassium chloride 20 MEQ/15ML (10%) SOLN Place 7.5 mLs (10 mEq total) into feeding tube daily.  . predniSONE 5 MG/ML concentrated solution Place 4 mLs (20 mg total) into feeding tube every 12 (twelve) hours.  . Water For Irrigation, Sterile (FREE WATER) SOLN Place 300 mLs into feeding tube every 8 (eight) hours.   No facility-administered encounter medications on file as of 10/02/2017.     PHYSICAL EXAM:   General: Chronically ill, frail appearing, In NAD Cardiovascular: regular rate and rhythm Pulmonary: clear ant fields Abdomen: soft, nontender, + bowel sounds.  PEG tube noted with continuous infusion of tube feedings GU: Foley catheter in place and patent of clear amber urine Extremities: no edema,contractures noted.  No muscle mass Skin: Reports of sacral and R buttock unstageable wound but not seen Neurological: Turning head to and fro.  Intermittent moaning.  Responds to name with eye contact. Squeezes left hand to command.  Non-verbal.  Weakness  Gonzella Lex, NP

## 2017-10-02 NOTE — Progress Notes (Signed)
:  Location:  [facility] [facility] Room Number: 950D Place of Service:  SNF (31)  Alexine Pilant D. Sheppard Coil, MD  Patient Care Team: [name], MD as PCP - General (Internal Medicine)  Extended Emergency Contact Information Primary Emergency Contact: [name] Address: [address]          [name], [place] [address] Johnnette Litter of [place] Phone: [phone] Relation: Daughter Secondary Emergency Contact: Z[place], [place] Mobile Phone: [phone] Relation: Son     Allergies: Codeine; Hydrocodone; and Tramadol hcl  Chief Complaint  Patient presents with  . Acute Visit    Family Meeting    HPI: Patient is 77 y.o. female whose family myself and the palliative care nurse Enid Derry are seen today to catch them up on her condition.  From all accounts by the patient's youngest daughter who is the one who comes to visit her and who is the one who did not want a PEG to be placed it appears that the rest the family does not quite understand the emotional pain patient is in due to her expressive aphasia and her overall condition which is now leading her to be losing weight and declining despite PEG tube feedings.  Past Medical History:  Diagnosis Date  . Allergic rhinitis, cause unspecified   . Benign neoplasm of colon   . Chronic diastolic CHF (congestive heart failure) ([place])   . CVA (cerebral vascular accident) ([place]) 05/2017  . Esophageal reflux   . Excessive daytime sleepiness   . 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 GI hemorrhage 2011   recurrent  . Hyperlipidemia   . Osteoporosis   . Polymyositis ([place])   . Unspecified essential hypertension   . Vitamin D deficiency     Past Surgical History:  Procedure Laterality Date  . ABDOMINAL HYSTERECTOMY     partial  . BACK SURGERY    . COLONOSCOPY  01/09/2011   others also  . COLONOSCOPY  05/29/2011   Procedure: COLONOSCOPY;  Surgeon: Gatha Mayer, MD;  Location: [facility] ENDOSCOPY;   Service: Endoscopy;  Laterality: N/A;  Greggory Brandy Carlean Purl  . COLONOSCOPY WITH PROPOFOL N/A 02/16/2014   Procedure: COLONOSCOPY WITH PROPOFOL;  Surgeon: Milus Banister, MD;  Location: [facility] ENDOSCOPY;  Service: Endoscopy;  Laterality: N/A;  . ESOPHAGOGASTRODUODENOSCOPY  12/08/2010   others also  . ESOPHAGOGASTRODUODENOSCOPY N/A 08/17/2017   Procedure: ESOPHAGOGASTRODUODENOSCOPY (EGD);  Surgeon: Jackquline Denmark, MD;  Location: [facility] ENDOSCOPY;  Service: Endoscopy;  Laterality: N/A;  . ESOPHAGOGASTRODUODENOSCOPY (EGD) WITH PROPOFOL N/A 02/16/2014   Procedure: ESOPHAGOGASTRODUODENOSCOPY (EGD) WITH PROPOFOL;  Surgeon: Milus Banister, MD;  Location: [facility] END[facility]COPY;  Service: Endoscopy;  Laterality: N/A;  . EUS    . HOT HEMOSTASIS  05/29/2011   Procedure: HOT HEMOSTASIS (ARGON PLASMA COAGULATION/BICAP);  Surgeon: Gatha Mayer, MD;  Location: [facility];  Service: Endoscopy;  Laterality: N/A;  . IR GASTROSTOMY TUBE MOD SED  09/02/2017  . LUMBAR LAMINECTOMY    . ROTATOR CUFF REPAIR Bilateral   . TONSILLECTOMY  age 56    Allergies as of 10/02/2017      Reactions   Codeine Other (See Comments)   Feels drunk   Hydrocodone    "makes me sleepy"   Tramadol Hcl Other (See Comments)   Makes me feel drunk      Medication List        Accurate as of 10/02/17  3:19 PM. Always use your most recent med list.  acetaminophen 160 MG/5ML solution Commonly known as:  TYLENOL Place 20.3 mLs (650 mg total) into feeding tube every 6 (six) hours as needed for mild pain, headache or fever.   allopurinol 100 MG tablet Commonly known as:  ZYLOPRIM Place 1 tablet (100 mg total) into feeding tube daily.   ALPRAZolam 0.25 MG tablet Commonly known as:  XANAX Place 1 tablet (0.25 mg total) into feeding tube 2 (two) times daily as needed for anxiety (for 14 days.).   amLODipine 5 MG tablet Commonly known as:  NORVASC Take 5 mg by mouth daily.   aspirin 81 MG chewable tablet Place 1 tablet (81 mg total) into feeding  tube daily.   DIFLUCAN 100 MG tablet Generic drug:  fluconazole Take 100 mg by mouth daily. Give 1 tablet via peg tube daily x 2 weeks for Oral thrush   diphenoxylate-atropine 2.5-0.025 MG/5ML liquid Commonly known as:  LOMOTIL Place 5 mLs into feeding tube daily as needed for diarrhea or loose stools.   feeding supplement (OSMOLITE 1.5 CAL) Liqd Place 237 mLs into feeding tube 4 (four) times daily.   feeding supplement (PRO-STAT SUGAR FREE 64) Liqd Place 30 mLs into feeding tube 2 (two) times daily.   free water Soln Place 300 mLs into feeding tube every 8 (eight) hours.   metoprolol tartrate 25 mg/10 mL Susp Commonly known as:  LOPRESSOR Place 20 mLs (50 mg total) into feeding tube 2 (two) times daily.   morphine 20 MG/ML concentrated solution Commonly known as:  ROXANOL Take by mouth every 2 (two) hours as needed for severe pain. 0.32ml every 3 hrs   mycophenolate 200 MG/ML suspension Commonly known as:  CELLCEPT Take by mouth. Place 5 ml ( 1,000 mg ) into tube feeding twice daily   pantoprazole sodium 40 mg/20 mL Pack Commonly known as:  PROTONIX Place 20 mLs (40 mg total) into feeding tube daily.   potassium chloride 20 MEQ/15ML (10%) Soln Place 7.5 mLs (10 mEq total) into feeding tube daily.   predniSONE 5 MG/ML concentrated solution Place 4 mLs (20 mg total) into feeding tube every 12 (twelve) hours.       No orders of the defined types were placed in this encounter.   Immunization History  Administered Date(s) Administered  . Influenza Split 10/07/2011  . Influenza Whole 10/13/2008    Social History   Tobacco Use  . Smoking status: Never Smoker  . Smokeless tobacco: Former Systems developer    Types: Snuff  . Tobacco comment: 07/21/2017 "no snuff  since ~ 04/2017"  Substance Use Topics  . Alcohol use: No    Family history is   Family History  Problem Relation Age of Onset  . Dementia Mother   . Hypertension Father   . Malignant hyperthermia Father   .  Colon cancer Neg Hx       Review of Systems unable to obtain secondary to expressive aphasia: Nursing- no acute concerns; patient continues to be declining      Vitals:   10/02/17 1443  BP: 117/78  Pulse: (!) 57  Resp: 19  Temp: 98.4 F (36.9 C)    SpO2 Readings from Last 1 Encounters:  09/09/17 93%   Body mass index is 22.86 kg/m.     Physical Exam  GENERAL APPEARANCE: Semi-alert, non-conversant, mild distress.  Patient appears more comfortable today than she did 2 days ago SKIN: No diaphoresis rash HEAD: Normocephalic, atraumatic  EYES: Conjunctiva/lids clear. Pupils round, reactive. EOMs intact.  EARS: External exam  WNL, canals clear. Hearing grossly normal.  NOSE: No deformity or discharge.  MOUTH/THROAT: Lips w/o lesions  RESPIRATORY: Breathing is even, unlabored. Lung sounds are clear   CARDIOVASCULAR: Heart RRR no murmurs, rubs or gallops. No peripheral edema.   GASTROINTESTINAL: Abdomen is soft, non-tender, not distended w/ normal bowel sounds. GENITOURINARY: Bladder non tender, not distended  MUSCULOSKELETAL: Wasting of muscles NEUROLOGIC: Status post hypoxic brain injury; patient appears to be able to understand but cannot speak or swallow; has left side paresis some movement of right upper extremity PSYCHIATRIC: Patient appears depressed and frustrated, no behavioral issues  Patient Active Problem List   Diagnosis Date Noted  . History of lower GI bleeding 09/13/2017  . Acute respiratory failure with hypoxia ([place]) 09/13/2017  . Hypoxic brain injury ([place]) 09/13/2017  . Dysphagia 09/13/2017  . Status post insertion of percutaneous endoscopic gastrostomy (PEG) tube ([place]) 09/13/2017  . Thrush, oral 09/13/2017  . Diarrhea due to malabsorption 09/13/2017  . Acute upper GI bleed 08/17/2017  . Nontraumatic hemorrhagic shock ([place]) 08/17/2017  . Hyperlipidemia 07/26/2017  . Pressure ulcer of buttock 07/22/2017  . Neck mass 07/21/2017  . Neuropathic pain   .  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 Neurologic deficit due to acute ischemic stroke ([place])   . Hypokalemia   . CVA (cerebral vascular accident) ([place]) 06/09/2017  . Multiple pulmonary nodules 06/09/2017  . Hypothermia 10/22/2016  . Chronic diastolic CHF (congestive heart failure) ([place]) 10/22/2016  . Abdominal pain, unspecified site 05/17/2013  . Nausea alone 05/17/2013  . 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 Abdominal pain, left lower quadrant 05/28/2011  . Cecal ulcer with hemorrhage 01/10/2011  . Acute blood loss anemia 12/06/2010  . Anemia 12/05/2010  . Adrenal insufficiency ([place]) 12/05/2010  . Osteoporosis 12/05/2010  . LUNG INVOLVEMENT OTHER DISEASES CLASSIFIED ELSW 08/13/2009  . OTHER SPECIFIED DISORDER OF STOMACH AND DUODENUM 07/06/2008  . Essential hypertension 04/26/2007  . ALLERGIC RHINITIS 04/26/2007  . GASTROESOPHAGEAL REFLUX DISEASE 04/26/2007  . Polymyositis ([place]) 04/26/2007  . COLONIC POLYPS, ADENOMATOUS 02/06/2005      Labs reviewed: Basic Metabolic Panel:    Component Value Date/Time   NA 137 09/04/2017 0422   NA 141 08/15/2017   K 4.1 09/04/2017 0422   CL 105 09/04/2017 0422   CO2 25 09/04/2017 0422   GLUCOSE 138 (H) 09/04/2017 0422   BUN 13 09/04/2017 0422   BUN 31 (A) 08/15/2017   CREATININE 0.36 (L) 09/04/2017 0422   CALCIUM 8.4 (L) 09/04/2017 0422   PROT 4.5 (L) 09/02/2017 1157   ALBUMIN 2.3 (L) 09/02/2017 1157   AST 27 09/02/2017 1157   ALT 36 09/02/2017 1157   ALKPHOS 50 09/02/2017 1157   BILITOT 0.7 09/02/2017 1157   GFRNONAA >60 09/04/2017 0422   GFRAA >60 09/04/2017 0422    Recent Labs    08/21/17 0436  08/25/17 0414 08/26/17 0340 08/28/17 1134  09/02/17 1157 09/03/17 0323 09/04/17 0422  NA 143   < > 138 139 140   < > 142 138 137  K 3.3*   < > 3.0* 3.8 4.7   < > 3.0* 3.7 4.1  CL 107   < > 100 103 105   < > 107 104 105  CO2 26   < > 28 27 27    < > 26 27 25   GLUCOSE 79   < >  206* 111* 104*   < > 179* 86 138*  BUN 9   < >  5* 10 13   < > 19 12 13   CREATININE 0.34*   < > 0.47 0.43* 0.43*   < > 0.44 0.39* 0.36*  CALCIUM 8.5*   < > 8.2* 8.4* 8.7*   < > 8.0* 8.1* 8.4*  MG 1.7  --  1.7 2.3 2.1  --  2.1  --   --   PHOS 2.5  --  2.4* 2.3*  --   --   --   --   --    < > = values in this interval not displayed.   Liver Function Tests: Recent Labs    08/25/17 0414 08/26/17 0340 09/02/17 1157  AST 28 29 27   ALT 25 26 36  ALKPHOS 42 53 50  BILITOT 0.8 0.6 0.7  PROT 4.6* 5.2* 4.5*  ALBUMIN 2.5* 2.7* 2.3*   Recent Labs    05/15/17 1048  LIPASE 33   No results for input(s): AMMONIA in the last 8760 hours. CBC: Recent Labs    08/20/17 0428  08/22/17 0425 08/23/17 0415  09/02/17 0657 09/03/17 0323 09/04/17 0422  WBC 15.6*   < > 10.2 8.8   < > 9.7 11.4* 10.5  NEUTROABS 13.9*  --  8.0* 7.0  --   --   --   --   HGB 9.0*   < > 10.3* 10.1*   < > 10.6* 10.0* 11.2*  HCT 27.4*   < > 31.8* 31.2*   < > 34.9* 32.1* 35.3*  MCV 80.8   < > 80.7 81.3   < > 88.1 86.1 85.3  PLT 212   < > 221 248   < > 357 317 382   < > = values in this interval not displayed.   Lipid Recent Labs    06/10/17 0545  CHOL 299*  HDL 66  LDLCALC 164*  TRIG 347*    Cardiac Enzymes: Recent Labs    10/22/16 1600  01/02/17 1415  07/21/17 0907 07/22/17 0500 07/23/17 0401  CKTOTAL  --    < >  --    < > 2,750* 2,096* 1,552*  TROPONINI <0.03  --  <0.03  --   --   --   --    < > = values in this interval not displayed.   BNP: No results for input(s): BNP in the last 8760 hours. No results found for: [facility] Lab Results  Component Value Date   HGBA1C 4.7 (L) 06/10/2017   Lab Results  Component Value Date   TSH 1.080 10/22/2016   Lab Results  Component Value Date   VITAMINB12 1,377 (H) 08/25/2017   Lab Results  Component Value Date   FOLATE 25.4 08/25/2017   Lab Results  Component Value Date   IRON 55 05/30/2011   TIBC 266 05/30/2011   FERRITIN 68 05/30/2011     Imaging and Procedures obtained prior to SNF admission: Ct Head Wo Contrast  Result Date: 09/02/2017 CLINICAL DATA:  Encephalopathy EXAM: CT HEAD WITHOUT CONTRAST TECHNIQUE: Contiguous axial images were obtained from the base of the skull through the vertex without intravenous contrast. COMPARISON:  08/19/2017 FINDINGS: Brain: No findings to suggest acute hemorrhage, acute infarction or space-occupying mass lesion are noted. Marked bilateral chronic white matter ischemic change is noted stable from the prior exam. Vascular: No hyperdense vessel or unexpected calcification. Skull: Normal. Negative for fracture or focal lesion. Sinuses/Orbits: No acute finding. Other: None. IMPRESSION: Chronic white matter ischemic changes and atrophic changes stable from the prior exam. No  acute abnormality is noted. Electronically Signed   By: Inez Catalina M.D.   On: 09/02/2017 19:45   Ir Gastrostomy Tube Mod Sed  Result Date: 09/02/2017 INDICATION: 77 year old female with dysphagia. She is currently taking enteral nutrition via a transpyloric feeding tube and presents for placement of a more durable percutaneous gastrostomy tube. EXAM: Fluoroscopically guided placement of percutaneous pull-through gastrostomy tube Interventional Radiologist:  Criselda Peaches, MD MEDICATIONS: 2 g Ancef; Antibiotics were administered within 1 hour of the procedure. ANESTHESIA/SEDATION: Versed 2 mg IV; Fentanyl 50 mcg IV Moderate Sedation Time:  10 minutes The patient was continuously monitored during the procedure by the interventional radiology nurse under my direct supervision. CONTRAST:  15 mL Isovue-300 FLUOROSCOPY TIME:  Fluoroscopy Time: 1 minutes 54 seconds (7 mGy). COMPLICATIONS: None immediate. PROCEDURE: Informed written consent was obtained from the patient after a thorough discussion of the procedural risks, benefits and alternatives. All questions were addressed. Maximal Sterile Barrier Technique was utilized including  caps, mask, sterile gowns, sterile gloves, sterile drape, hand hygiene and skin antiseptic. A timeout was performed prior to the initiation of the procedure. Maximal barrier sterile technique utilized including caps, mask, sterile gowns, sterile gloves, large sterile drape, hand hygiene, and chlorhexadine skin prep. An angled catheter was advanced over a wire under fluoroscopic guidance through the nose, down the esophagus and into the body of the stomach. The stomach was then insufflated with several 100 ml of air. Fluoroscopy confirmed location of the gastric bubble, as well as inferior displacement of the barium stained colon. Under direct fluoroscopic guidance, a single T-tack was placed, and the anterior gastric wall drawn up against the anterior abdominal wall. Percutaneous access was then obtained into the mid gastric body with an 18 gauge sheath needle. Aspiration of air, and injection of contrast material under fluoroscopy confirmed needle placement. An Amplatz wire was advanced in the gastric body and the access needle exchanged for a 9-French vascular sheath. A snare device was advanced through the vascular sheath and an Amplatz wire advanced through the angled catheter. The Amplatz wire was successfully snared and this was pulled up through the esophagus and out the mouth. A 20-French Alinda Dooms MIC-PEG tube was then connected to the snare and pulled through the mouth, down the esophagus, into the stomach and out to the anterior abdominal wall. Hand injection of contrast material confirmed intragastric location. The T-tack retention suture was then cut. The pull through peg tube was then secured with the external bumper and capped. The patient will be observed for several hours with the newly placed tube on low wall suction to evaluate for any post procedure complication. The patient tolerated the procedure well, there is no immediate complication. IMPRESSION: Successful placement of a 20 French pull  through gastrostomy tube. Electronically Signed   By: Jacqulynn Cadet M.D.   On: 09/02/2017 10:24     Not all labs, radiology exams or other studies done during hospitalization come through on my EPIC note; however they are reviewed by me.    Assessment and Plan  Hypoxic brain injury secondary to nontraumatic hemorrhagic shock/pressure ulcer of buttocks-Kennedy ulcer/status post CVA prior/encounter for family conference -in attendance is myself, Enid Derry the palliative care nurse, patient's 4 children and 3 in laws to be: Both Enid Derry and I explained to the family the physical changes that have taken place with patient since her admission to the nursing home in 3 out of the 4 children had been able to see changes themselves and were  accepting of the fact that their mother was dying; there were at intervals some family discussions or arguments that were resolved with the exception of one son, Ned Clines, who had been the major person who wanted her to have a PEG tube continue to be unrealistic in his expectations. He suggested that may be stem cells would help her with her pain, and wanted to continue her lipid lowering medication in case she survived because I would help her in the future: Despite this we were able to offer the family hospice in the excepted, which means accepted that she would no longer have any sort of physical or speech therapy; they accepted the fact that patient was dying but if for some reason patient turned around and we would support her in every way possible; they desire that patient stay at the skilled nursing facility for her comfort; we did not discuss the termination of the PEG tube feedings because Enid Derry and I felt and it appeared that the rest of the family felt that this was going to be an issue for D[place], and it was not going to make any difference in any case because patient was declining even with full caloric input.  Patient was made a DNR, hospice was consulted and  pain medications were increased from morphine 10 mg every 3 to morphine 10 mg q. 2.   I am spent greater than 120 minutes Peta Peachey D. Sheppard Coil, MD

## 2017-10-09 ENCOUNTER — Encounter: Payer: Self-pay | Admitting: Internal Medicine

## 2017-10-10 ENCOUNTER — Encounter: Payer: Self-pay | Admitting: Internal Medicine

## 2017-10-10 DIAGNOSIS — I6930 Unspecified sequelae of cerebral infarction: Secondary | ICD-10-CM | POA: Insufficient documentation

## 2017-10-10 DIAGNOSIS — L89154 Pressure ulcer of sacral region, stage 4: Secondary | ICD-10-CM | POA: Insufficient documentation

## 2017-10-13 DEATH — deceased

## 2017-10-15 ENCOUNTER — Ambulatory Visit: Payer: Medicare Other | Admitting: Adult Health

## 2017-10-17 DIAGNOSIS — Z515 Encounter for palliative care: Secondary | ICD-10-CM | POA: Insufficient documentation

## 2017-10-17 DIAGNOSIS — R531 Weakness: Secondary | ICD-10-CM | POA: Insufficient documentation

## 2017-10-17 DIAGNOSIS — E46 Unspecified protein-calorie malnutrition: Secondary | ICD-10-CM | POA: Insufficient documentation

## 2017-10-17 NOTE — Progress Notes (Signed)
PALLIATIVE CARE CONSULT VISIT   PATIENT NAME: [name] [name] DOB: July 12, 1940 MRN: [id number]  PRIMARY CARE PROVIDER:   [name], MD  REFERRING PROVIDER: Dr. Inocencio Homes,  [facility]  RESPONSIBLE PARTY:   2 daughters and 2 sons      RECOMMENDATIONS and PLAN:  1.  Weakness  R53.1:  Unchanged new baseline.  Provide total care and support  2.  Protein-calorie malnutrition  E46:  Continuous tube feedings remain.  Family desires continuation of same. Supportive care and continue repositioning.   3.  Palliative care encounter  Z51.5:  Met with daughter Hal Morales at bedside.  She is one of 4 children with equal decision making.  No HCPOA.  She would like for all siblings to be present for a meeting related to advanced planning for patient.  She will contact siblings and we will meet again next week.  Pt. Status updated with daughter.     I spent 15 minutes providing this consultation  from 12:00pm to 12:15pm at [facility] More than 50% of the time in this consultation was spent coordinating with daughter, clinical staff and SW communication.   HISTORY OF PRESENT ILLNESS: Follow-up with [name] [name] finds that she is slightly more alert and is able to follow commands with L hand squeeze and eye contact.  She remains in a poor state of health.  She remains total care and bedbound.  No reports of recurrent GI bleed.   CODE STATUS: DNAR  PPS: 30%  With continuous tube feedings HOSPICE ELIGIBILITY/DIAGNOSIS: TBD  PAST MEDICAL HISTORY:  Past Medical History:  Diagnosis Date  . Allergic rhinitis, cause unspecified   . Benign neoplasm of colon   . Chronic diastolic CHF (congestive heart failure) ([place])   . CVA (cerebral vascular accident) ([place]) 05/2017  . Esophageal reflux   . Excessive daytime sleepiness   . 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 GI hemorrhage 2011   recurrent  . Hyperlipidemia   . Osteoporosis   . Polymyositis ([place])   . Unspecified essential  hypertension   . Vitamin D deficiency     SOCIAL HX:  Social History   Tobacco Use  . Smoking status: Never Smoker  . Smokeless tobacco: Former Systems developer    Types: Snuff  . Tobacco comment: 07/21/2017 "no snuff  since ~ 04/2017"  Substance Use Topics  . Alcohol use: No    ALLERGIES:  Allergies  Allergen Reactions  . Codeine Other (See Comments)    Feels drunk  . Hydrocodone     "makes me sleepy"  . Tramadol Hcl Other (See Comments)    Makes me feel drunk     PERTINENT MEDICATIONS:  Outpatient Encounter Medications as of 09/18/2017  Medication Sig  . acetaminophen (TYLENOL) 160 MG/5ML solution Place 20.3 mLs (650 mg total) into feeding tube every 6 (six) hours as needed for mild pain, headache or fever.  Marland Kitchen allopurinol (ZYLOPRIM) 100 MG tablet Place 1 tablet (100 mg total) into feeding tube daily.  Marland Kitchen ALPRAZolam (XANAX) 0.25 MG tablet Place 1 tablet (0.25 mg total) into feeding tube 2 (two) times daily as needed for anxiety (for 14 days.).  Marland Kitchen Amino Acids-Protein Hydrolys (FEEDING SUPPLEMENT, PRO-STAT SUGAR FREE 64,) LIQD Place 30 mLs into feeding tube 2 (two) times daily.  Marland Kitchen aspirin 81 MG chewable tablet Place 1 tablet (81 mg total) into feeding tube daily.  . diphenoxylate-atropine (LOMOTIL) 2.5-0.025 MG/5ML liquid Place 5 mLs into feeding tube daily  as needed for diarrhea or loose stools.  . metoprolol tartrate (LOPRESSOR) 25 mg/10 mL SUSP Place 20 mLs (50 mg total) into feeding tube 2 (two) times daily.  . Nutritional Supplements (FEEDING SUPPLEMENT, OSMOLITE 1.5 CAL,) LIQD Place 237 mLs into feeding tube 4 (four) times daily.  . pantoprazole sodium (PROTONIX) 40 mg/20 mL PACK Place 20 mLs (40 mg total) into feeding tube daily.  . potassium chloride 20 MEQ/15ML (10%) SOLN Place 7.5 mLs (10 mEq total) into feeding tube daily.  . predniSONE 5 MG/ML concentrated solution Place 4 mLs (20 mg total) into feeding tube every 12 (twelve) hours.  . Water For Irrigation, Sterile (FREE WATER) SOLN  Place 300 mLs into feeding tube every 8 (eight) hours.  . [DISCONTINUED] mycophenolate (CELLCEPT) 50 mg/mL SUSP oral suspension Place 20 mLs (1,000 mg total) into feeding tube 2 (two) times daily.   No facility-administered encounter medications on file as of 09/18/2017.     PHYSICAL EXAM:   General: NAD, chronically ill and frail appearing elderly female reclining in bed Cardiovascular: regular rate and rhythm Pulmonary: Rhonchi BUL.  Unlabored breathing Abdomen: soft, nontender, + bowel sounds.  Continuous tube feedings via PEG tube Extremities: decreased muscle mass.  Contractures of BLE Skin: Exposed skin is intact Neurological: Alert, attempts 1 word response  Moans intermittently.  Squeezes L hand to command  Gonzella Lex, NP-C

## 2017-10-17 NOTE — Progress Notes (Signed)
   Disregard note.   Gonzella Lex, NP

## 2017-10-17 NOTE — Progress Notes (Signed)
PALLIATIVE CARE CONSULT VISIT   PATIENT NAME: [name] [name] DOB: [birth year] MRN: [id number]  PRIMARY CARE PROVIDER:   [name], MD  REFERRING PROVIDER: Dr. Inocencio [name],  [facility]  RESPONSIBLE PARTY:   2 daughters and 2 sons      RECOMMENDATIONS and PLAN:  1.  Weakness  R53.1:  Chronic state and multifactorial.  New baseline of non communicative or mobile.  Tube feeding dependant.  Provide all supportive care.  Plan on discussion with children.  2.  Protein-calorie malnutrition  E46:  Continuous tube feedings currently.  No expectation for major improvement but will monitor nutritional and hydration status.  Repositioning encouraged to prevent skin alteration.   3.  Palliative care encounter  Z51.5:  No family at bedside.  Previous DNAR status made. Plan on discussion with daughter who lives locally to discuss advanced care planning and goals of care.  Palliative care will continue to follow.    I spent 20 minutes providing this consultation,  from 9:00am to 9:20am at [facility]. More than 50% of the time in this consultation was spent coordinating with clinical staff and SW communication.   HISTORY OF PRESENT ILLNESS:  [name] [name] is a 77 y.o. year old female with multiple medical problems including recent hospitalization from 8/.5-8/28/19 due to hemorrhagic shock as a result of lower GI bleed, acute respiratory failure requiring intubation, aspirationn PNA and encephalopathy.  Prior to above occurrences, pt was alert and oriented and able to voice her needs.  She was mobile via wheelchair only with a prior hx of polymyositis and CVA. Since returning from the hospital, staff reports that pt is total care, unable to speak, is totally bed bound and has a new PEG with continuous tube feedings.  She is incontinent of B&B.  Palliative Care was asked to help address goals of care and advanced care planning.   CODE STATUS: DNAR  PPS: 30%  With continuous tube  feedings HOSPICE ELIGIBILITY/DIAGNOSIS: TBD  PAST MEDICAL HISTORY:  Past Medical History:  Diagnosis Date  . Allergic rhinitis, cause unspecified   . Benign neoplasm of colon   . Chronic diastolic CHF (congestive heart failure) ([place])   . CVA (cerebral vascular accident) ([place]) 05/2017  . Esophageal reflux   . Excessive daytime sleepiness   . 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 GI hemorrhage 2011   recurrent  . Hyperlipidemia   . Osteoporosis   . Polymyositis ([place])   . Unspecified essential hypertension   . Vitamin D deficiency     SOCIAL HX:  Social History   Tobacco Use  . Smoking status: Never Smoker  . Smokeless tobacco: Former Systems developer    Types: Snuff  . Tobacco comment: 07/21/2017 "no snuff  since ~ 04/2017"  Substance Use Topics  . Alcohol use: No    ALLERGIES:  Allergies  Allergen Reactions  . Codeine Other (See Comments)    Feels drunk  . Hydrocodone     "makes me sleepy"  . Tramadol Hcl Other (See Comments)    Makes me feel drunk     PERTINENT MEDICATIONS:  Outpatient Encounter Medications as of 09/18/2017  Medication Sig  . acetaminophen (TYLENOL) 160 MG/5ML solution Place 20.3 mLs (650 mg total) into feeding tube every 6 (six) hours as needed for mild pain, headache or fever.  Marland Kitchen allopurinol (ZYLOPRIM) 100 MG tablet Place 1 tablet (100 mg total) into feeding tube daily.  Marland Kitchen ALPRAZolam (XANAX) 0.25 MG tablet  Place 1 tablet (0.25 mg total) into feeding tube 2 (two) times daily as needed for anxiety (for 14 days.).  Marland Kitchen Amino Acids-Protein Hydrolys (FEEDING SUPPLEMENT, PRO-STAT SUGAR FREE 64,) LIQD Place 30 mLs into feeding tube 2 (two) times daily.  Marland Kitchen aspirin 81 MG chewable tablet Place 1 tablet (81 mg total) into feeding tube daily.  . diphenoxylate-atropine (LOMOTIL) 2.5-0.025 MG/5ML liquid Place 5 mLs into feeding tube daily as needed for diarrhea or loose stools.  . metoprolol tartrate (LOPRESSOR) 25 mg/10 mL SUSP Place 20 mLs (50 mg total) into feeding  tube 2 (two) times daily.  . Nutritional Supplements (FEEDING SUPPLEMENT, OSMOLITE 1.5 CAL,) LIQD Place 237 mLs into feeding tube 4 (four) times daily.  . pantoprazole sodium (PROTONIX) 40 mg/20 mL PACK Place 20 mLs (40 mg total) into feeding tube daily.  . potassium chloride 20 MEQ/15ML (10%) SOLN Place 7.5 mLs (10 mEq total) into feeding tube daily.  . predniSONE 5 MG/ML concentrated solution Place 4 mLs (20 mg total) into feeding tube every 12 (twelve) hours.  . Water For Irrigation, Sterile (FREE WATER) SOLN Place 300 mLs into feeding tube every 8 (eight) hours.  . [DISCONTINUED] mycophenolate (CELLCEPT) 50 mg/mL SUSP oral suspension Place 20 mLs (1,000 mg total) into feeding tube 2 (two) times daily.   No facility-administered encounter medications on file as of 09/18/2017.     PHYSICAL EXAM:   General: NAD, chronically ill and frail appearing elderly female reclining in bed Cardiovascular: regular rate and rhythm Pulmonary: Rhonchi of upper bilat lobes anteriorly Abdomen: soft, nontender, + bowel sounds.  Continuous tube feedings via PEG tube GU: no suprapubic tenderness Extremities: decreased muscle mass.  Contractures of BLE Skin: Exposed skin is intact Neurological: Somnolent.  Opens eyes with gentle stimulation.  No speech.  Moans intermittently  Gonzella Lex, NP-C

## 2017-10-20 NOTE — Progress Notes (Signed)
    PALLIATIVE CARE CONSULT VISIT   PATIENT NAME: [name] [name] DOB: April 11, 1940 MRN: [id number]  NOTE:  Disregard note.  Appointment did not occur.  Gonzella Lex, NP

## 2020-03-26 IMAGING — MR MR CERVICAL SPINE W/O CM
10 of 16 series · 28 of 48 positions shown · IV contrast (Yes)
Comparison: CT HEAD June 09, 2017 and MRI of the cervical spine
March 23, 2017 and MRI of the head April 22, 2012

CLINICAL DATA: Generalized numbness beginning in upper extremity
since this morning, weakness. Recent viral illness, on prednisone.

EXAM:
MRI HEAD WITHOUT CONTRAST
MRI CERVICAL SPINE WITHOUT CONTRAST
TECHNIQUE: Multiplanar, multiecho pulse sequences of the brain and surrounding
structures, and cervical spine, to include the craniocervical
junction and cervicothoracic junction, were obtained without
intravenous contrast.

[Series 3: DWI · axial · 3.0mm · 1.09mm/px · z∈[-49,+102]mm · 8 of 104 slices shown (1 of 2)]
[im 1/104]
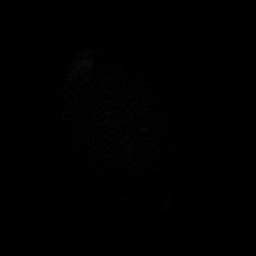
[im 12/104]
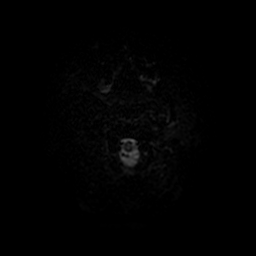
[im 35/104]
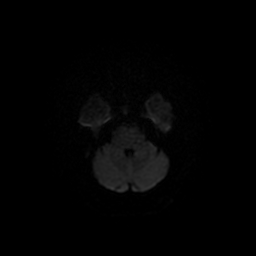
[im 46/104]
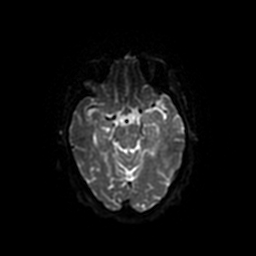
[im 58/104]
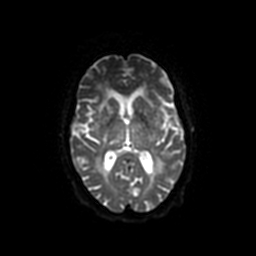
[im 69/104]
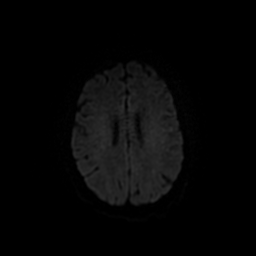
[im 92/104]
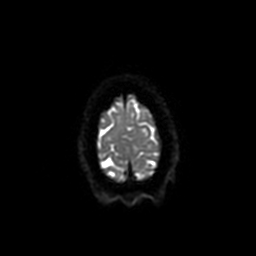
[im 104/104]
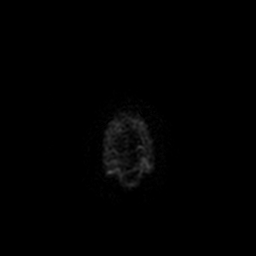

[Series 4: T1 · sagittal · 5.0mm · 0.47mm/px · 2 of 24 slices shown (1 of 2)]
[im 1/24]
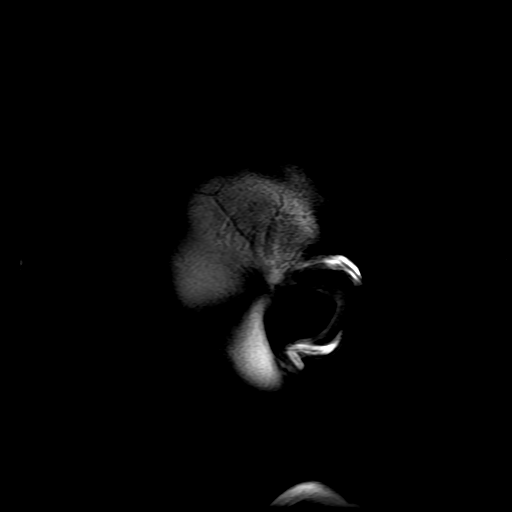
[im 24/24]
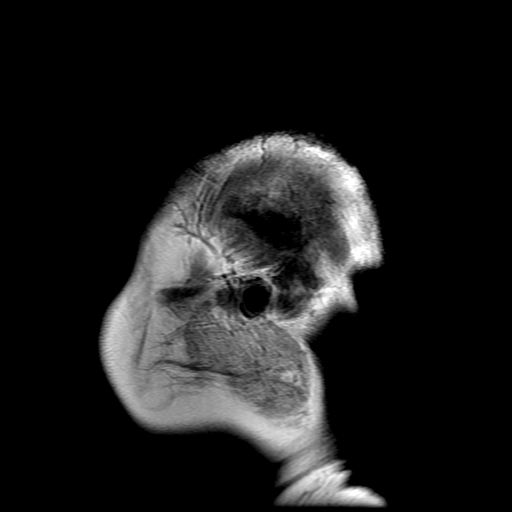

[Series 5: DWI · coronal · 5.0mm · 1.09mm/px · 6 of 72 slices shown (2 of 2)]
[im 1/72]
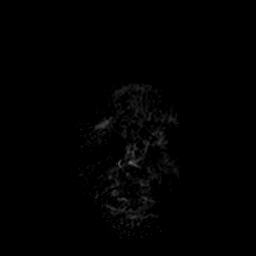
[im 15/72]
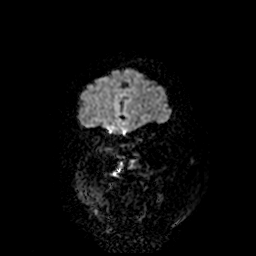
[im 29/72]
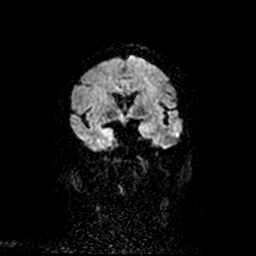
[im 43/72]
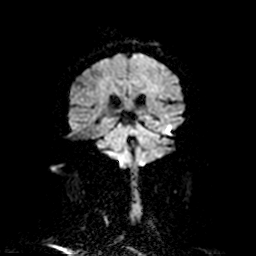
[im 57/72]
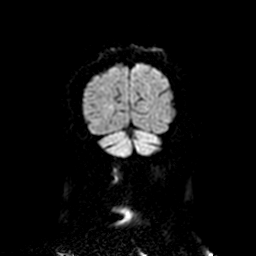
[im 72/72]
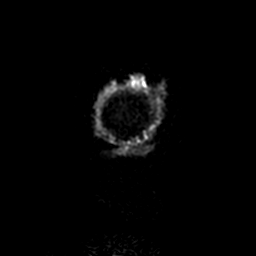

[Series 6: T2 · axial · 5.0mm · 0.43mm/px · z∈[-39,+108]mm · 2 of 26 slices shown (1 of 4)]
[im 1/26]
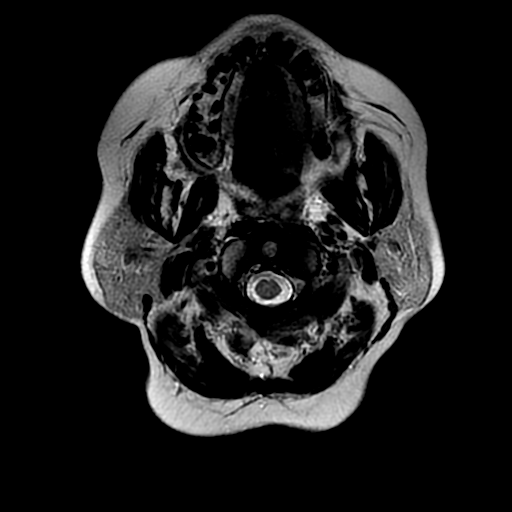
[im 26/26]
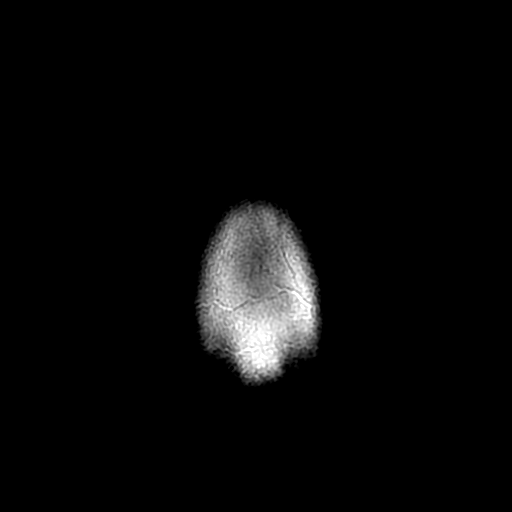

[Series 7: FLAIR · axial · 3.0mm · 0.43mm/px · z∈[-39,+108]mm · 2 of 26 slices shown]
[im 1/26]
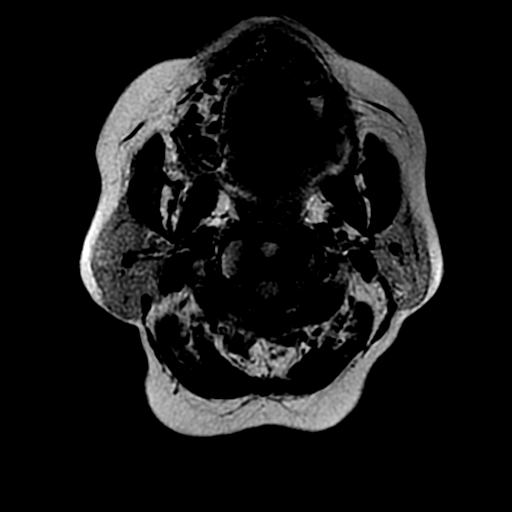
[im 26/26]
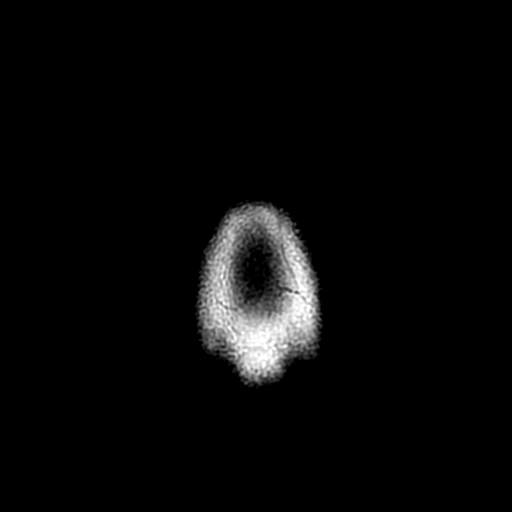

[Series 8: ax mpgr · axial · 5.0mm · 0.43mm/px · 1 of 23 slices shown]
[im 1/23]
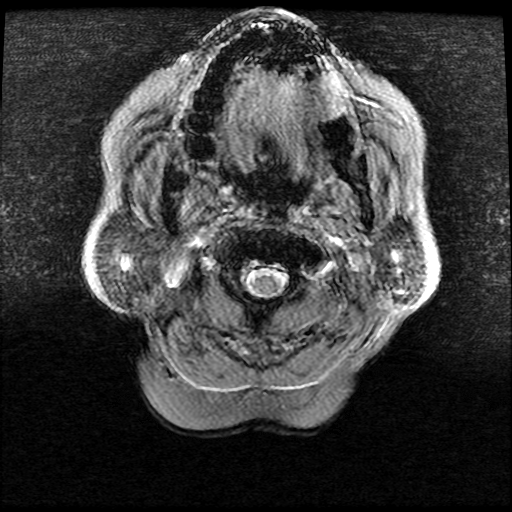

[Series 10: T2 · coronal · 5.0mm · 0.45mm/px · 2 of 28 slices shown (2 of 4)]
[im 1/28]
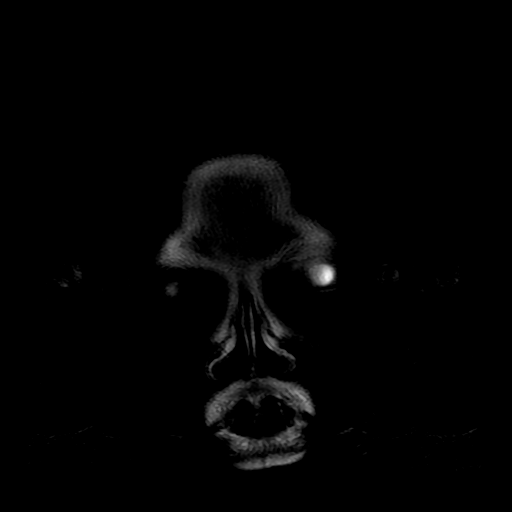
[im 28/28]
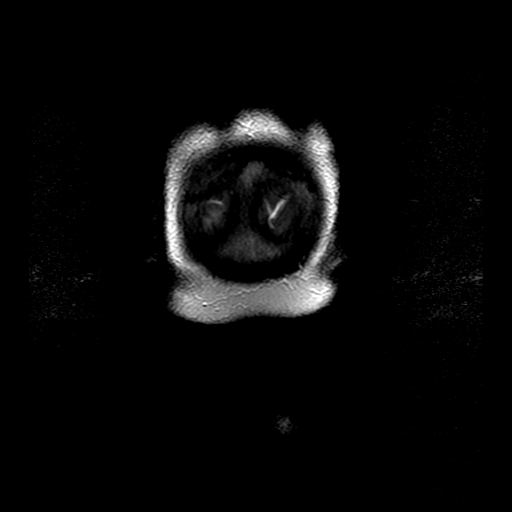

[Series 12: T1 · sagittal · 3.0mm · 0.82mm/px · 1 of 12 slices shown (2 of 2)]
[im 1/12]
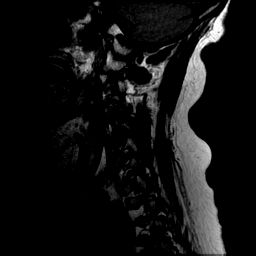

[Series 13: T2 · sagittal · 3.0mm · 0.41mm/px · 1 of 12 slices shown (3 of 4)]
[im 1/12]
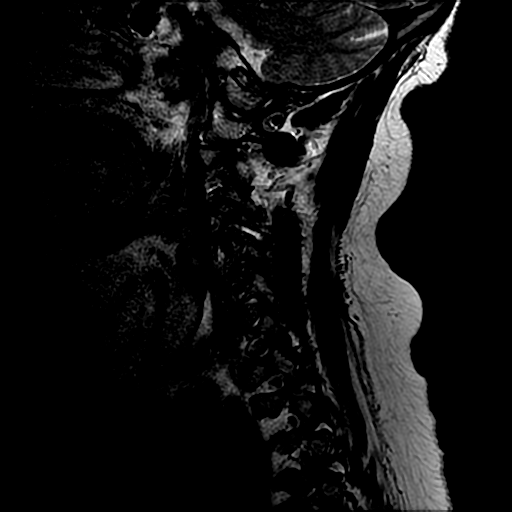

[Series 16: T2 · axial · 3.1mm · 0.35mm/px · z∈[-152,-53]mm · 3 of 31 slices shown (4 of 4)]
[im 1/31]
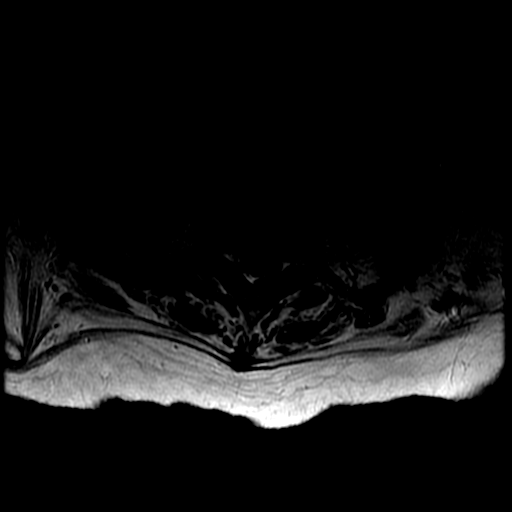
[im 16/31]
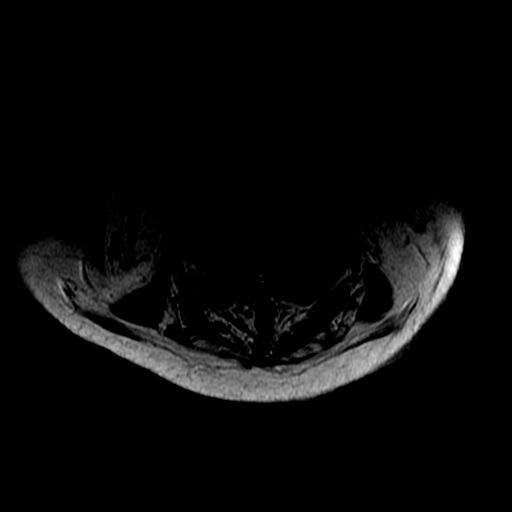
[im 31/31]
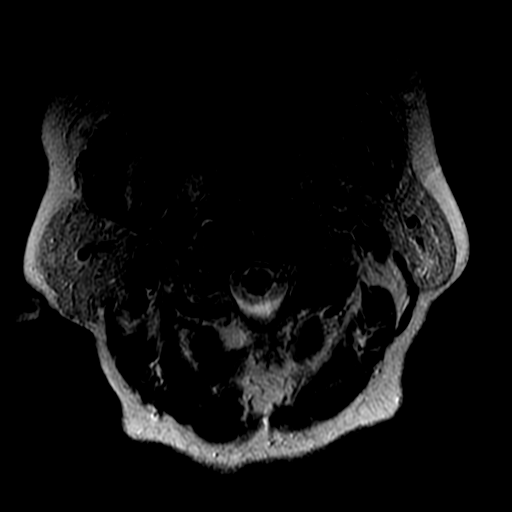

[28 of 48 positions shown; findings below may reference images not displayed]

FINDINGS: MRI HEAD FINDINGS

INTRACRANIAL CONTENTS: Patchy reduced diffusion LEFT frontoparietal
lobes with low ADC values. Subcentimeter reduced diffusion LEFT
thalamus with normalized ADC values. No susceptibility artifact to
suggest hemorrhage. Confluent supratentorial white matter FLAIR T2
hyperintensities. Patchy pontine T2 hyperintensities. Old RIGHT
basal ganglia and RIGHT thalamus cystic small infarcts. Hazy T2
hyperintensities bilateral basal ganglia and bilateral thalami seen
with chronic small vessel ischemic changes. No parenchymal brain
volume loss for age. No midline shift, mass effect or masses.

VASCULAR: Normal major intracranial vascular flow voids present at
skull base.

SKULL AND UPPER CERVICAL SPINE: No abnormal sellar expansion. No
suspicious calvarial bone marrow signal. Craniocervical junction
maintained.

SINUSES/ORBITS: The mastoid air-cells and included paranasal sinuses
are well-aerated.The included ocular globes and orbital contents are
non-suspicious.

OTHER: None.

MRI CERVICAL SPINE FINDINGS

ALIGNMENT: Straightened cervical lordosis. Minimal grade 1 C4-5
anterolisthesis.

VERTEBRAE/DISCS: Vertebral bodies are intact. Intervertebral disc
and demonstrate normal morphology with mild desiccation. No abnormal
or acute bone marrow signal.

CORD:Cervical spinal cord is normal morphology and signal
characteristics from the cervicomedullary junction to level of T2-3,
the most caudal well visualized level.

POSTERIOR FOSSA, VERTEBRAL ARTERIES, PARASPINAL TISSUES: No MR
findings of ligamentous injury. Vertebral artery flow voids present.
Included posterior fossa and paraspinal soft tissues are normal.

DISC LEVELS:

C2-3: No disc bulge. Uncovertebral hypertrophy and severe facet
arthropathy without canal stenosis. Moderate to severe RIGHT neural
foraminal narrowing.

C3-4: Uncovertebral hypertrophy. Moderate to severe bilateral facet
arthropathy. No canal stenosis. Moderate to severe LEFT neural
foraminal narrowing.

C4-5: Anterolisthesis. Severe RIGHT and moderate LEFT facet
arthropathy with trace RIGHT facet effusion. No canal stenosis or
neural foraminal narrowing.

C5-6, C6-7: No disc bulge, canal stenosis nor neural foraminal
narrowing.

C7-T1: No disc bulge. Mild facet arthropathy. No canal stenosis.
Mild LEFT neural foraminal narrowing.
IMPRESSION: MRI HEAD:

1. Patchy acute nonhemorrhagic infarcts LEFT frontoparietal/MCA
territories. Subacute subcentimeter LEFT thalamus infarct.
2. Old RIGHT basal ganglia and old RIGHT thalamus small infarcts.
Severe chronic small vessel ischemic changes.

MRI CERVICAL SPINE:

1. Progressed degenerative change of the cervical spine, grade 1
C4-5 anterolisthesis.
2. No canal stenosis. Moderate to severe C2-3 and C3-4 neural
foraminal narrowing.

## 2020-03-27 IMAGING — RF DG SWALLOWING FUNCTION - [facility]
1 series · 18 of 24 positions shown · non-contrast
Comparison: none

[Series 1: run · 22 acquisitions, 18 frames shown]
[im 1/22]
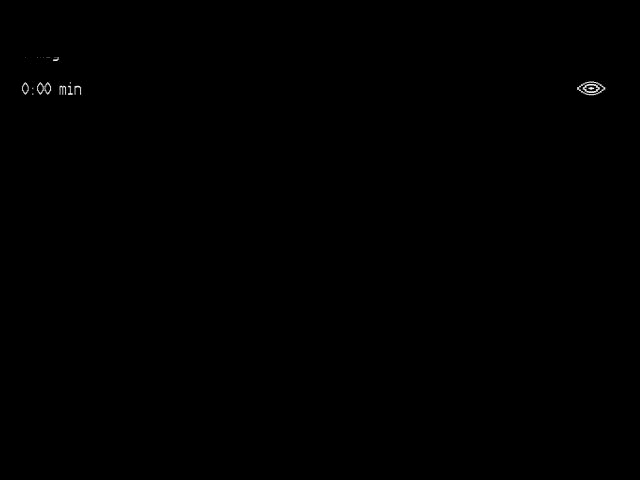
[im 3/22]
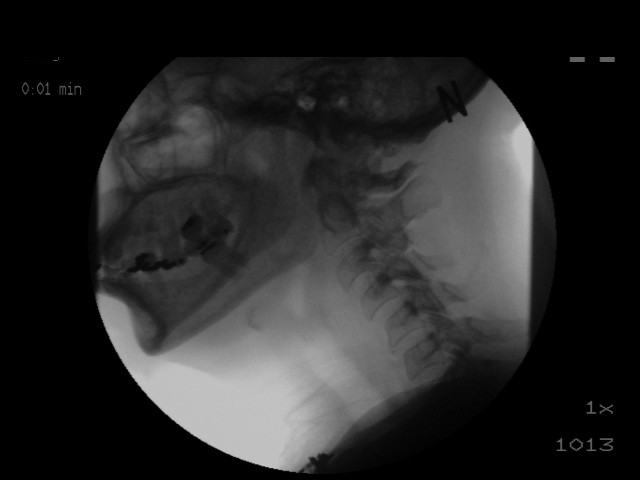
[im 3/22]
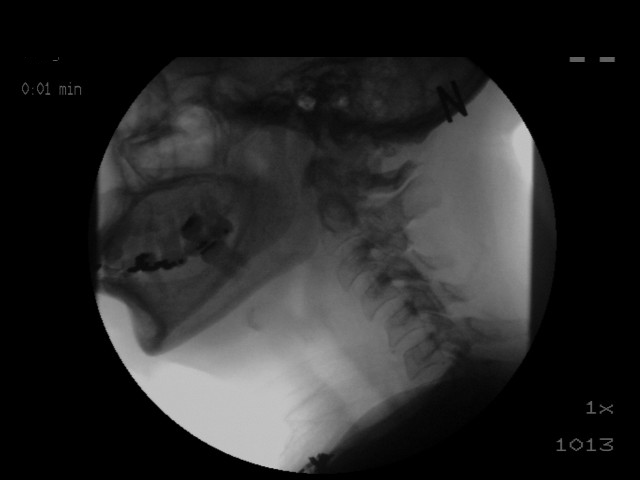
[im 4/22]
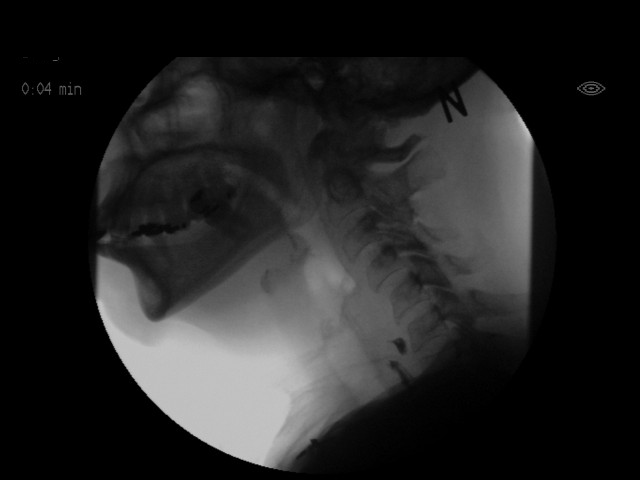
[im 6/22]
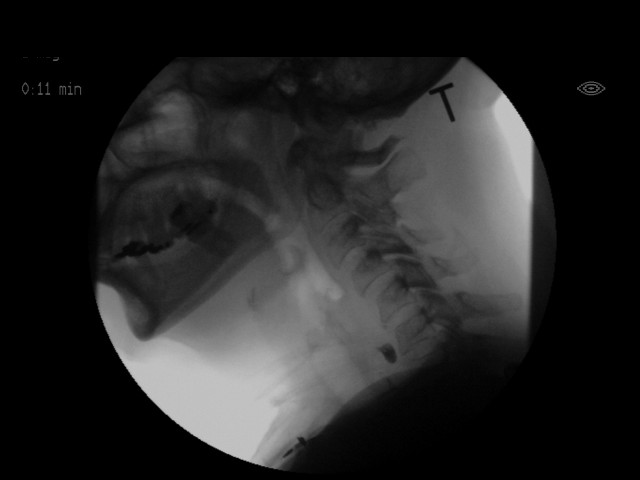
[im 7/22]
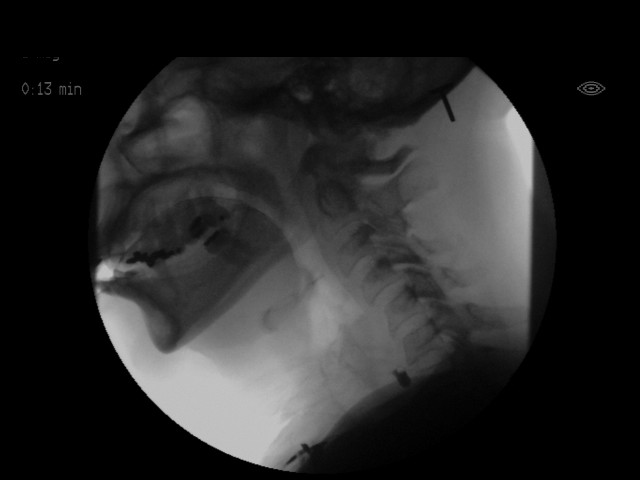
[im 8/22]
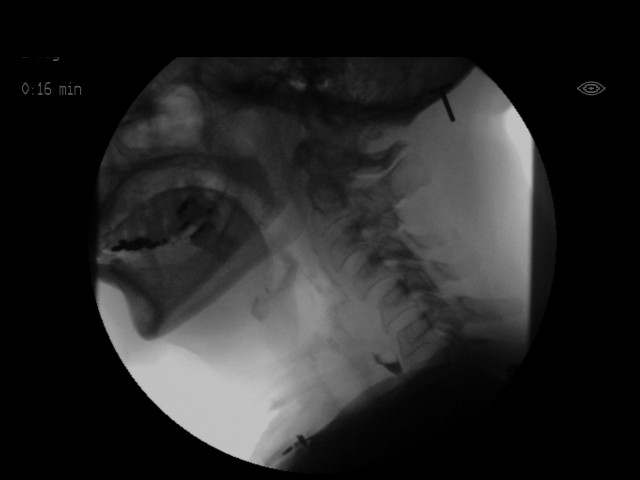
[im 10/22]
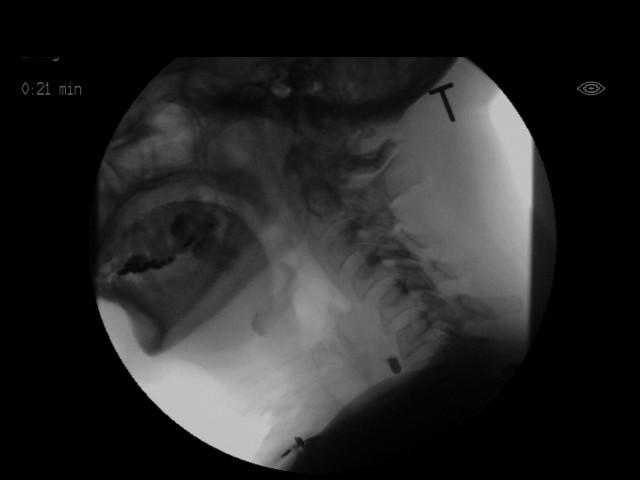
[im 11/22]
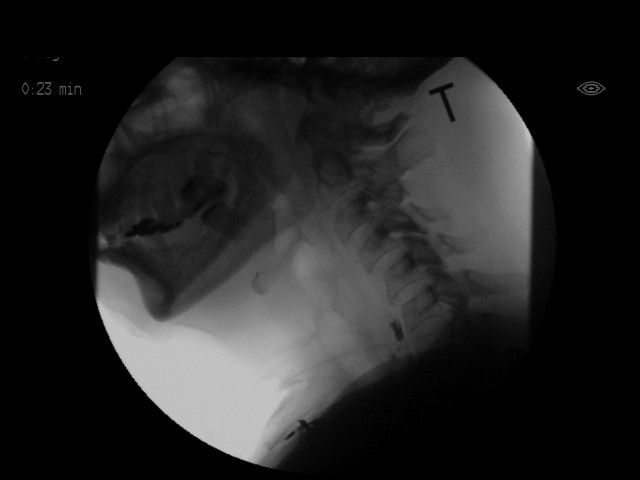
[im 12/22]
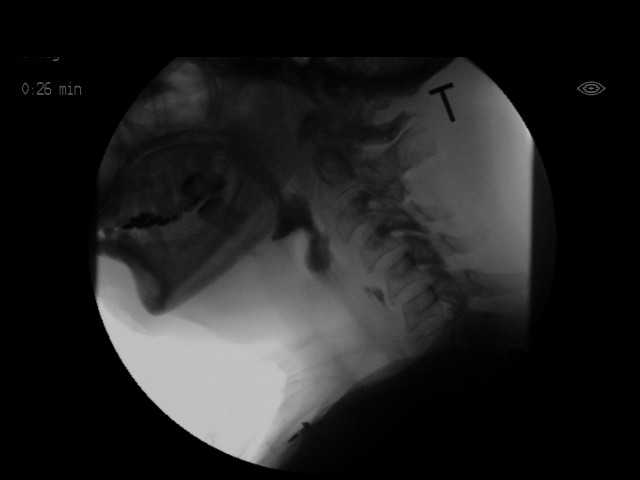
[im 14/22]
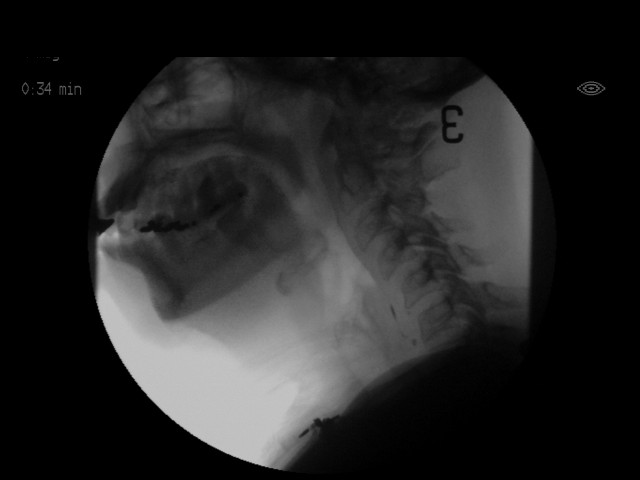
[im 15/22]
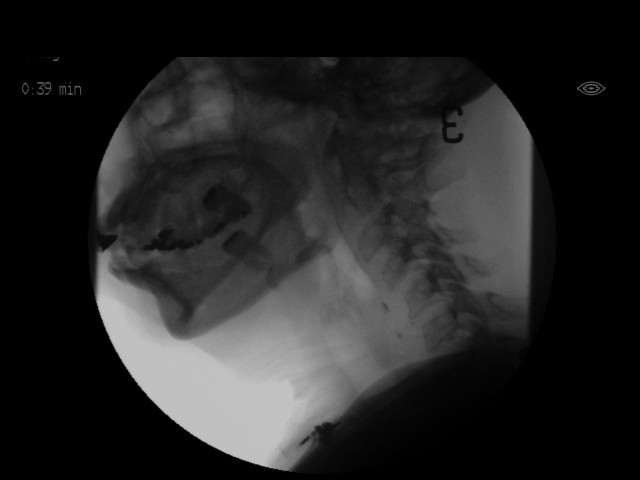
[im 16/22]
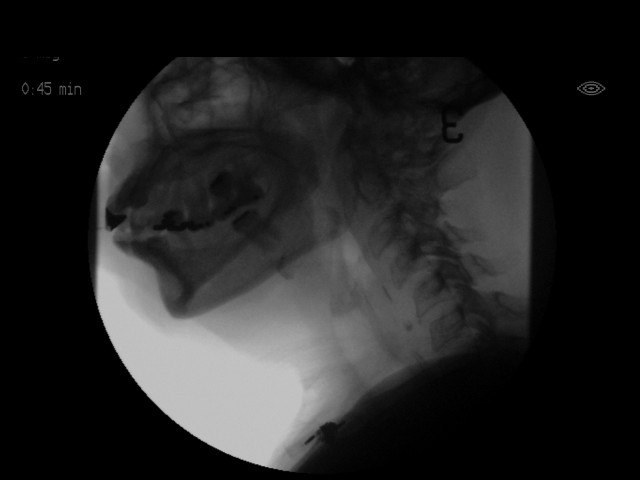
[im 18/22]
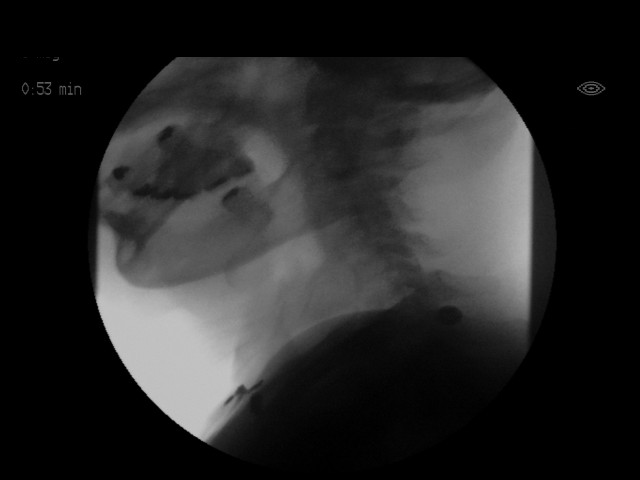
[im 19/22]
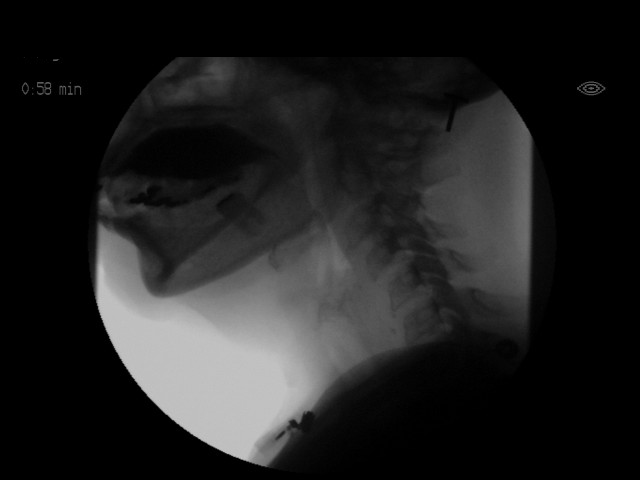
[im 20/22]
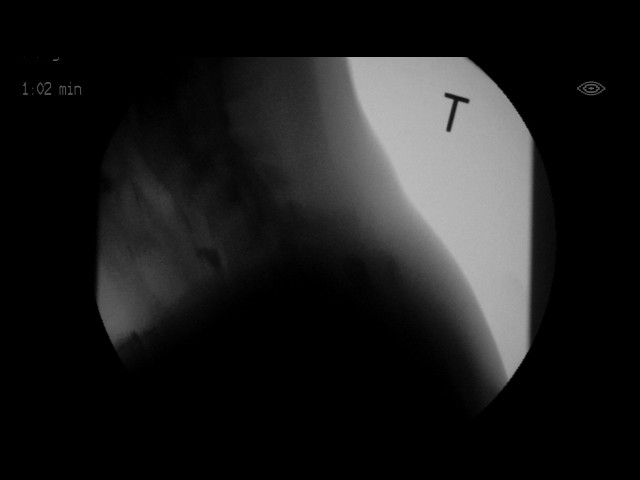
[im 21/22]
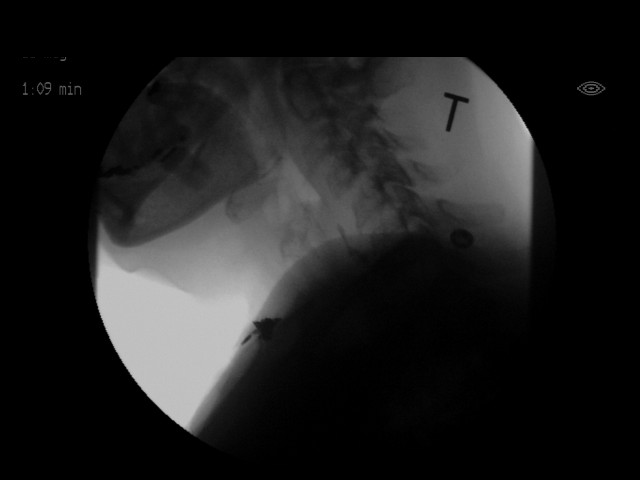
[im 22/22]
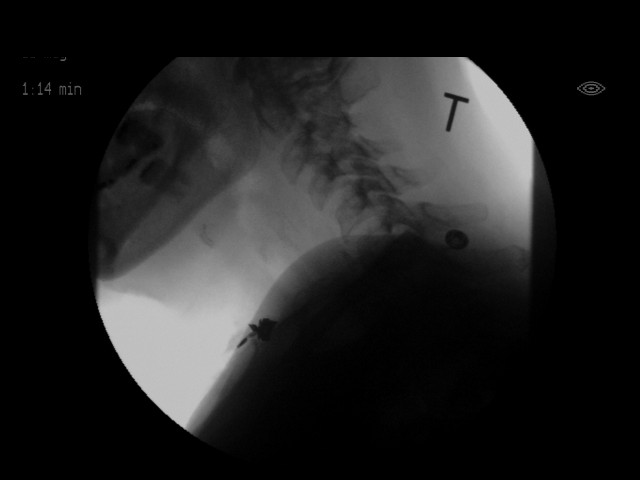

[18 of 24 positions shown; findings below may reference images not displayed]

FLUOROSCOPY FOR SWALLOWING FUNCTION STUDY:
Fluoroscopy was provided for swallowing function study, which was administered by a speech pathologist.  Final results and recommendations from this study are contained within the speech pathology report.
